# Patient Record
Sex: Male | Born: 1955 | Race: White | Hispanic: No | Marital: Married | Smoking: Former smoker
Health system: Southern US, Community
[De-identification: ages and names within clinical notes are randomized; demographics above are authoritative.]

## PROBLEM LIST (undated history)

## (undated) DIAGNOSIS — J45909 Unspecified asthma, uncomplicated: Secondary | ICD-10-CM

## (undated) DIAGNOSIS — I8390 Asymptomatic varicose veins of unspecified lower extremity: Secondary | ICD-10-CM

## (undated) DIAGNOSIS — N400 Enlarged prostate without lower urinary tract symptoms: Secondary | ICD-10-CM

## (undated) DIAGNOSIS — M542 Cervicalgia: Secondary | ICD-10-CM

## (undated) DIAGNOSIS — I10 Essential (primary) hypertension: Secondary | ICD-10-CM

## (undated) DIAGNOSIS — J189 Pneumonia, unspecified organism: Secondary | ICD-10-CM

## (undated) DIAGNOSIS — J3489 Other specified disorders of nose and nasal sinuses: Secondary | ICD-10-CM

## (undated) DIAGNOSIS — R0600 Dyspnea, unspecified: Secondary | ICD-10-CM

## (undated) DIAGNOSIS — H9190 Unspecified hearing loss, unspecified ear: Secondary | ICD-10-CM

## (undated) DIAGNOSIS — I671 Cerebral aneurysm, nonruptured: Secondary | ICD-10-CM

## (undated) DIAGNOSIS — L0390 Cellulitis, unspecified: Secondary | ICD-10-CM

## (undated) DIAGNOSIS — M1990 Unspecified osteoarthritis, unspecified site: Secondary | ICD-10-CM

## (undated) DIAGNOSIS — J449 Chronic obstructive pulmonary disease, unspecified: Secondary | ICD-10-CM

## (undated) DIAGNOSIS — K219 Gastro-esophageal reflux disease without esophagitis: Secondary | ICD-10-CM

## (undated) DIAGNOSIS — C801 Malignant (primary) neoplasm, unspecified: Secondary | ICD-10-CM

## (undated) DIAGNOSIS — I872 Venous insufficiency (chronic) (peripheral): Secondary | ICD-10-CM

## (undated) DIAGNOSIS — G8929 Other chronic pain: Secondary | ICD-10-CM

## (undated) DIAGNOSIS — I729 Aneurysm of unspecified site: Secondary | ICD-10-CM

## (undated) DIAGNOSIS — F319 Bipolar disorder, unspecified: Secondary | ICD-10-CM

## (undated) DIAGNOSIS — F909 Attention-deficit hyperactivity disorder, unspecified type: Secondary | ICD-10-CM

## (undated) HISTORY — DX: Gastro-esophageal reflux disease without esophagitis: K21.9

## (undated) HISTORY — PX: NOSE SURGERY: SHX723

## (undated) HISTORY — DX: Asymptomatic varicose veins of unspecified lower extremity: I83.90

## (undated) HISTORY — PX: JOINT REPLACEMENT: SHX530

## (undated) HISTORY — PX: OTHER SURGICAL HISTORY: SHX169

## (undated) HISTORY — DX: Cerebral aneurysm, nonruptured: I67.1

## (undated) HISTORY — PX: WISDOM TOOTH EXTRACTION: SHX21

## (undated) HISTORY — DX: Venous insufficiency (chronic) (peripheral): I87.2

---

## 2001-02-03 ENCOUNTER — Encounter: Payer: Self-pay | Admitting: Orthopedic Surgery

## 2001-02-03 ENCOUNTER — Ambulatory Visit (HOSPITAL_COMMUNITY): Admission: RE | Admit: 2001-02-03 | Discharge: 2001-02-03 | Payer: Self-pay | Admitting: Orthopedic Surgery

## 2002-08-30 ENCOUNTER — Ambulatory Visit (HOSPITAL_COMMUNITY): Admission: RE | Admit: 2002-08-30 | Discharge: 2002-08-30 | Payer: Self-pay | Admitting: Family Medicine

## 2002-08-30 ENCOUNTER — Encounter: Payer: Self-pay | Admitting: Family Medicine

## 2002-12-22 ENCOUNTER — Encounter: Payer: Self-pay | Admitting: Gastroenterology

## 2002-12-22 ENCOUNTER — Ambulatory Visit (HOSPITAL_COMMUNITY): Admission: RE | Admit: 2002-12-22 | Discharge: 2002-12-22 | Payer: Self-pay | Admitting: Gastroenterology

## 2002-12-23 ENCOUNTER — Encounter (INDEPENDENT_AMBULATORY_CARE_PROVIDER_SITE_OTHER): Payer: Self-pay | Admitting: *Deleted

## 2002-12-23 ENCOUNTER — Ambulatory Visit (HOSPITAL_COMMUNITY): Admission: RE | Admit: 2002-12-23 | Discharge: 2002-12-23 | Payer: Self-pay | Admitting: Gastroenterology

## 2003-08-14 ENCOUNTER — Encounter
Admission: RE | Admit: 2003-08-14 | Discharge: 2003-11-12 | Payer: Self-pay | Admitting: Physical Medicine and Rehabilitation

## 2003-11-13 ENCOUNTER — Encounter
Admission: RE | Admit: 2003-11-13 | Discharge: 2004-02-11 | Payer: Self-pay | Admitting: Physical Medicine and Rehabilitation

## 2004-02-13 ENCOUNTER — Encounter
Admission: RE | Admit: 2004-02-13 | Discharge: 2004-05-13 | Payer: Self-pay | Admitting: Physical Medicine and Rehabilitation

## 2004-06-04 ENCOUNTER — Encounter
Admission: RE | Admit: 2004-06-04 | Discharge: 2004-09-02 | Payer: Self-pay | Admitting: Physical Medicine and Rehabilitation

## 2004-06-05 ENCOUNTER — Ambulatory Visit: Payer: Self-pay | Admitting: Physical Medicine and Rehabilitation

## 2004-08-09 ENCOUNTER — Ambulatory Visit: Payer: Self-pay | Admitting: Physical Medicine and Rehabilitation

## 2004-09-16 ENCOUNTER — Encounter
Admission: RE | Admit: 2004-09-16 | Discharge: 2004-12-11 | Payer: Self-pay | Admitting: Physical Medicine and Rehabilitation

## 2004-10-16 ENCOUNTER — Ambulatory Visit: Payer: Self-pay | Admitting: Physical Medicine and Rehabilitation

## 2004-12-13 ENCOUNTER — Ambulatory Visit: Payer: Self-pay | Admitting: Physical Medicine and Rehabilitation

## 2005-02-13 ENCOUNTER — Ambulatory Visit: Payer: Self-pay | Admitting: Physical Medicine and Rehabilitation

## 2005-02-13 ENCOUNTER — Encounter
Admission: RE | Admit: 2005-02-13 | Discharge: 2005-05-14 | Payer: Self-pay | Admitting: Physical Medicine and Rehabilitation

## 2005-03-24 ENCOUNTER — Ambulatory Visit: Payer: Self-pay | Admitting: Physical Medicine and Rehabilitation

## 2005-05-23 ENCOUNTER — Encounter
Admission: RE | Admit: 2005-05-23 | Discharge: 2005-08-21 | Payer: Self-pay | Admitting: Physical Medicine and Rehabilitation

## 2005-05-23 ENCOUNTER — Ambulatory Visit: Payer: Self-pay | Admitting: Physical Medicine and Rehabilitation

## 2005-07-15 ENCOUNTER — Ambulatory Visit: Payer: Self-pay | Admitting: Physical Medicine and Rehabilitation

## 2005-09-11 ENCOUNTER — Ambulatory Visit: Payer: Self-pay | Admitting: Physical Medicine and Rehabilitation

## 2005-09-11 ENCOUNTER — Encounter
Admission: RE | Admit: 2005-09-11 | Discharge: 2005-12-10 | Payer: Self-pay | Admitting: Physical Medicine and Rehabilitation

## 2005-11-07 ENCOUNTER — Ambulatory Visit: Payer: Self-pay | Admitting: Physical Medicine and Rehabilitation

## 2005-12-30 ENCOUNTER — Encounter
Admission: RE | Admit: 2005-12-30 | Discharge: 2006-03-30 | Payer: Self-pay | Admitting: Physical Medicine and Rehabilitation

## 2005-12-30 ENCOUNTER — Ambulatory Visit: Payer: Self-pay | Admitting: Physical Medicine and Rehabilitation

## 2006-02-19 ENCOUNTER — Ambulatory Visit: Payer: Self-pay | Admitting: Physical Medicine and Rehabilitation

## 2006-04-16 ENCOUNTER — Ambulatory Visit: Payer: Self-pay | Admitting: Physical Medicine and Rehabilitation

## 2006-04-16 ENCOUNTER — Encounter
Admission: RE | Admit: 2006-04-16 | Discharge: 2006-07-15 | Payer: Self-pay | Admitting: Physical Medicine and Rehabilitation

## 2006-05-18 ENCOUNTER — Ambulatory Visit: Payer: Self-pay | Admitting: Physical Medicine and Rehabilitation

## 2006-06-15 ENCOUNTER — Ambulatory Visit: Payer: Self-pay | Admitting: Physical Medicine and Rehabilitation

## 2006-07-22 ENCOUNTER — Encounter
Admission: RE | Admit: 2006-07-22 | Discharge: 2006-10-20 | Payer: Self-pay | Admitting: Physical Medicine and Rehabilitation

## 2006-08-10 ENCOUNTER — Ambulatory Visit: Payer: Self-pay | Admitting: Physical Medicine and Rehabilitation

## 2007-12-02 ENCOUNTER — Ambulatory Visit (HOSPITAL_COMMUNITY): Admission: RE | Admit: 2007-12-02 | Discharge: 2007-12-02 | Payer: Self-pay | Admitting: Internal Medicine

## 2007-12-02 IMAGING — CR DG CHEST 2V
2 series · 2 of 2 positions shown · non-contrast
Comparison: None available

CLINICAL DATA: COUGH, NOSEBLEED

CHEST - 2 VIEW

[view not recorded (1 of 2)]
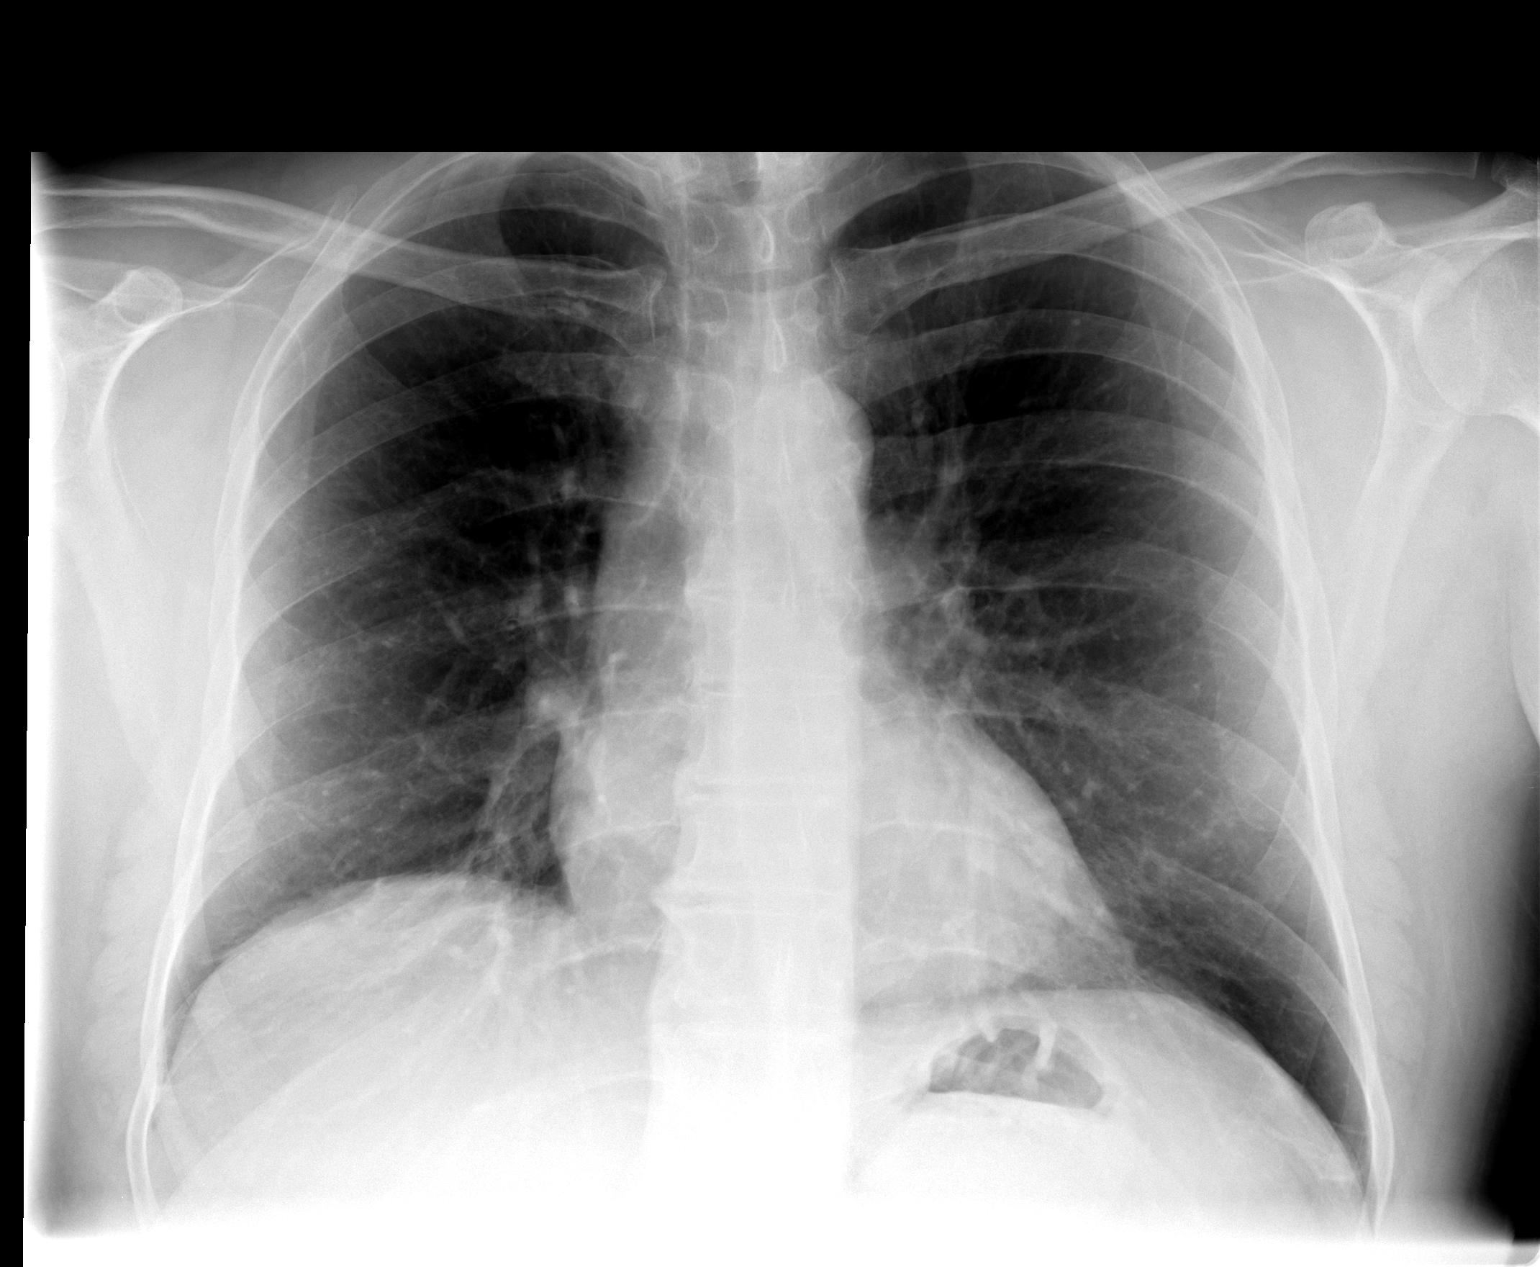

[view not recorded (2 of 2)]
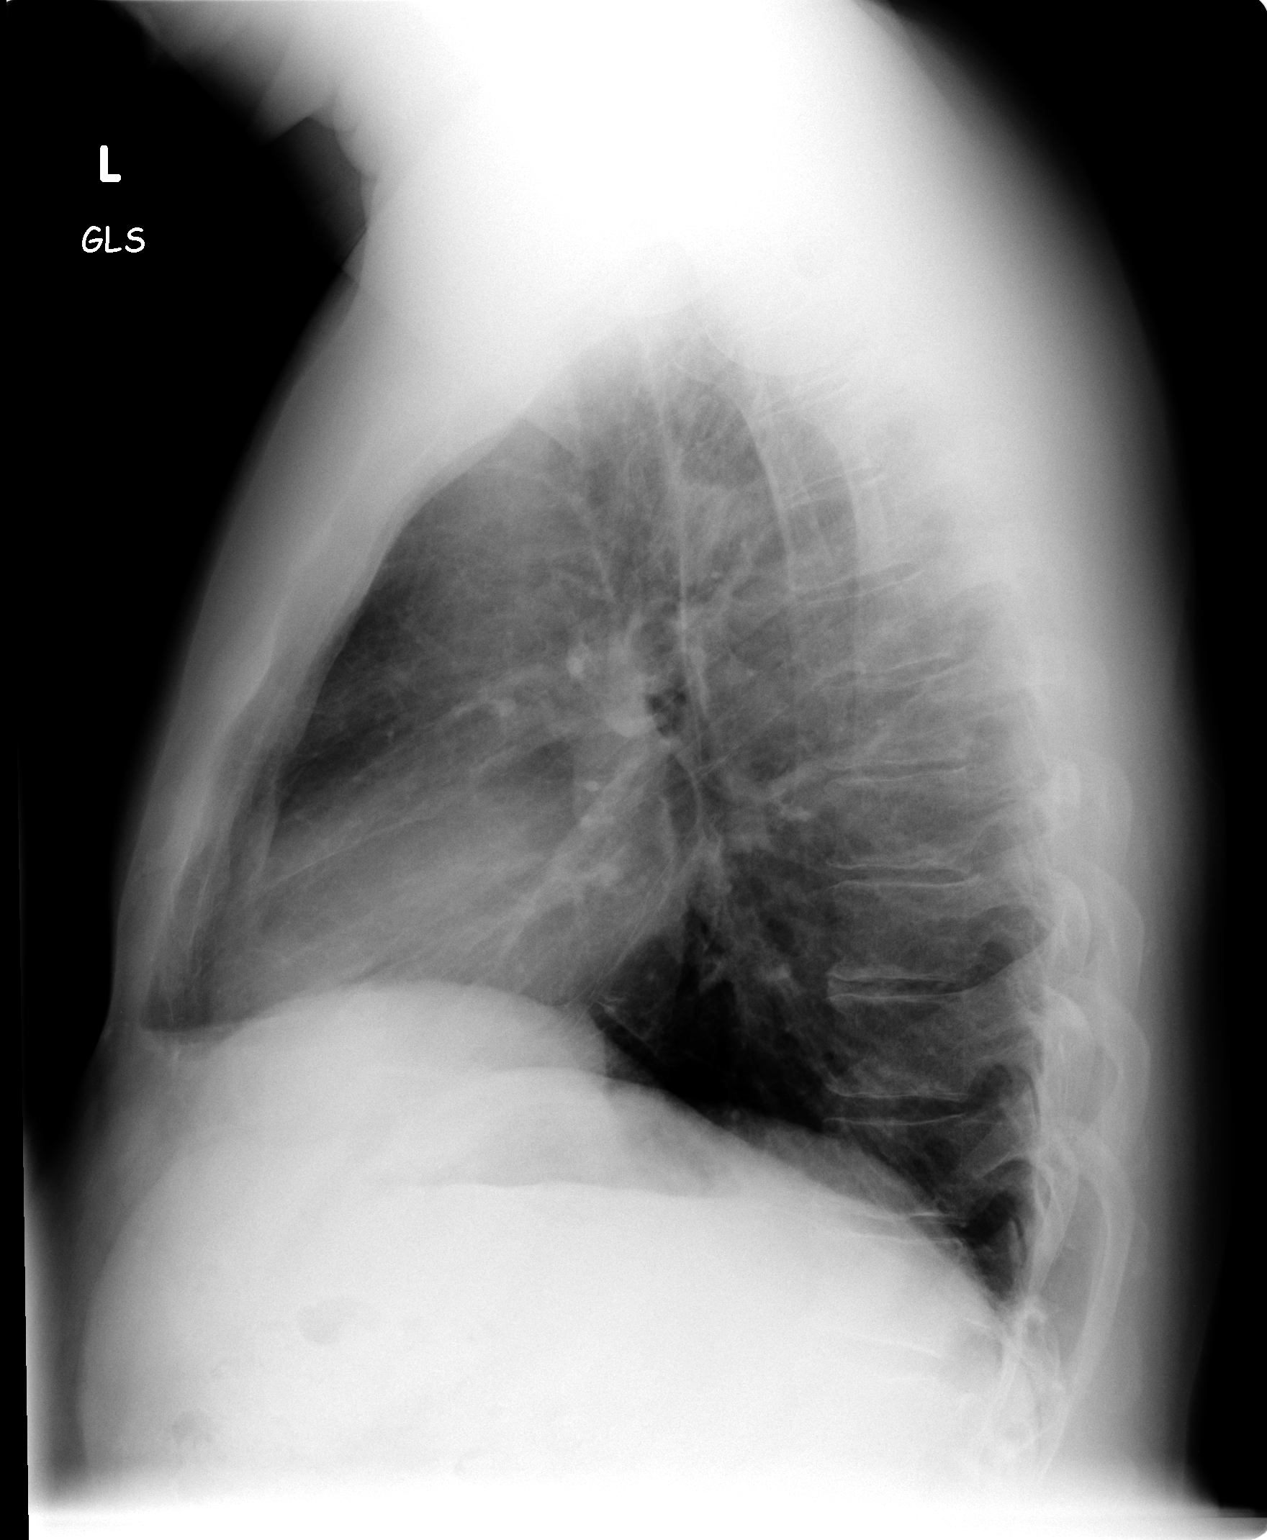

[2 of 2 positions shown; findings below may reference images not displayed]

FINDINGS: Lungs clear.  Heart size and pulmonary vascularity
normal.  No effusion.  Visualized bones unremarkable.]
IMPRESSION: [1.  No acute disease

## 2009-08-27 ENCOUNTER — Ambulatory Visit (HOSPITAL_BASED_OUTPATIENT_CLINIC_OR_DEPARTMENT_OTHER): Admission: RE | Admit: 2009-08-27 | Discharge: 2009-08-27 | Payer: Self-pay | Admitting: Orthopedic Surgery

## 2010-05-24 ENCOUNTER — Emergency Department (HOSPITAL_COMMUNITY): Admission: EM | Admit: 2010-05-24 | Discharge: 2010-05-25 | Payer: Self-pay | Admitting: Emergency Medicine

## 2010-12-09 LAB — POCT HEMOGLOBIN-HEMACUE: Hemoglobin: 15.2 g/dL (ref 13.0–17.0)

## 2010-12-30 ENCOUNTER — Other Ambulatory Visit: Payer: Self-pay | Admitting: Dermatology

## 2011-01-24 NOTE — Assessment & Plan Note (Signed)
Philip Gates is a 55 year old married white gentleman who is working full-time.  He is being seen our pain and rehabilitative clinic for chronic right knee  pain.   He is status post arthroscopic surgery.  He has chondromalacia of the  patella, is unable to take nonsteroidal anti-inflammatory medications.   His pain has been controlled with Percocet up to five per day for this.   He remains quite functional, working 40 hours a week.  He is also  participating in avocational activities such as golfing and other outside  activities.   He continues to be able to walk 30 minutes at a time, is a highly functional  individual.   No new problems regarding past medical, social or family history.   Denies any problems with bowel and bladder.  Denies depression or suicidal  ideation.  Does admit to anxiety, however.   PAIN INVENTORY:  Average pain is about a 4 on a scale of 5.  He sleeps for  the most part fairly well.  He gets fair relief with the current medications  he is on.  Pain is worse with activities, improves with rest, medications.   PHYSICAL EXAMINATION:  VITAL SIGNS:  Blood pressure today is 139/85, pulse  88, respirations 17, 99% saturated on room air.  GENERAL:  He is a well-developed, well-nourished gentleman who appears his  stated age.  He is oriented x3.  Affect is bright, alert, cooperative and  pleasant.  MUSCULOSKELETAL/NEUROLOGIC:  Gait in the room today reveals nonantalgic  gait, fairly good, normal mechanics.  There is no effusion noted in the  joint.  He has full range of motion in the joint, full extension.   IMPRESSION:  1.  Chondromalacia of the patella, right knee.  2.  History of gastroesophageal reflux disease and nonsteroidal anti-      inflammatory medication intolerance.  3.  Anxiety.   The patient wanted to discuss options in his medications today.  He has been  reading about longer-acting narcotics.  He states he typically takes several  Percocet a  day, does not like to be doing this on the job.  He is wondering  if he may benefit from a longer acting-type morphine.  He has been reading  about Kadian and would like to trial this for a few days to see how he would  react to it.  He also states he has low-grade anxiety.  We discussed  possibly adding Effexor.  He would like to trial this as well.   Today we will go ahead and give him a trial of Kadian 20 mg one p.o. q.a.m.  for five days.  We will have him assess how it works for him.  We will need  to reduce the Percocet he is taking if he continues on this medication.  We  will discuss this further next month.  Will also trial him on Effexor,  starting him with some samples at 37.5 mg for one week, followed by 75 mg,  and a prescription was written as well, one p.o. daily, 75 mg SR, #30, with  two refills.  We will see him back in the next six to eight weeks, reassess these  medications with him and assess pain management with him.           ______________________________  [name], M.D.     DMK/MedQ  D:  01/30/2006 11:38:09  T:  01/31/2006 09:50:58  Job #:  [id number]

## 2011-01-24 NOTE — Assessment & Plan Note (Signed)
The patient is a 55 year old married white male who is being seen in our  pain and rehabilitative clinic for pain management of his right knee pain.  He has undergone multiple surgeries on this knee and has chronic pain.   He has an intolerance to nonsteroidal anti-inflammatory agents, has a  history of gastroesophageal reflux disease, and is on Aciphex.   The patient's pain has been controlled well taking four to five Oxycodone  10/325 per day to remain functional.   He is back in.  He reports his average pain is about a 5 on a scale of 10,  currently at this time it is about a 4 on a scale of 10.  He describes his  pain as intermittent, dull, and aching.   Moderately interferes with his general activity, moderately interferes with  his enjoyment of life.   Pain is worse in the morning and in the evening.  Sleep overall is good.   Pain is worse with bending and sitting and certain activities which put  stress on his right knee.  Improves with rest and medication.  Reports good  relief with his medications.   FUNCTIONAL STATUS:  The patient walks without any assistance.  He is off and  on his leg all day long.  He is able to go up and down stairs.  He drives.  He is working full time as an Leisure centre manager.  Denies any problems with respect to  neuropsychiatric questions.   ALLERGIES:  No new allergies are noted.   He denies any new problems with review of systems.   Physicians currently involved in the patient's care include:  Danise Edge, M.D., who is his gastroenterologist, and Ike Bene, M.D. who  is his family medicine doctor.   He denies any changes in social, family history.  Denies any changes in his  past medical history.   PHYSICAL EXAMINATION:  VITAL SIGNS:  Blood pressure 145/86, pulse 100,  respirations 18, 96% saturated on room air.  GENERAL:  The patient is a well-developed, well-nourished, gentleman who  displays normal affect.  He is alert and oriented  x3.  Appropriate.  He is  able to get out of the chair without difficulty.  His gait is nonantalgic.  He has some difficulty getting up from a squat position due to pain in his  right knee seated.  He has no obvious swelling about the right knee.  He has  tenderness along the medial joint line and some tenderness in the pes  anserine region as well today.  He has no medial, lateral, or AP  instability.  He does have some crepitus with flexion and extension of the  knee.  He has full range of motion of the knee.   IMPRESSION:  1.  Right knee advanced chondromalacia patella.  2.  History of gastroesophageal reflux disease with nonsteroidal anti-      inflammatory agent intolerance.   PLAN:  I encouraged the patient to continue doing some stretches which were  taught to him recently for his calf, his hamstring, and piriformis muscles.  We will refill his Oxycodone 10/325 one p.o. q.4-6h p.r.n. pain #140.  We  will see him back in one month. He was also given a note today stating that  he had been seen in our clinic.       DMK/MedQ  D:  09/18/2004 09:37:24  T:  09/18/2004 10:01:50  Job #:  [id number]

## 2011-01-24 NOTE — Assessment & Plan Note (Signed)
Philip Gates is a 55 year old married white gentleman, who has been seen in our  pain and rehabilitative clinic for chronic right knee pain.  He is status  post arthroscopic surgery and has chondromalacia patellae and is unable to  take nonsteroidal anti-inflammatory medications.  This pain has been  controlled with oxycodone, and he takes up to 5 pills a day for this.   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  He is  able to participate in golfing and other outside activities.  He is able to  walk about 30 minutes at a time.  He climbs stairs.  He is driving.  He is  independent with all of his health care.   REVIEW OF SYSTEMS:  Negative for any new problems.   Past medical, social, family history are unchanged.   PHYSICAL EXAMINATION:  VITAL SIGNS:  Blood pressure 155/90, pulse 107,  respirations 15, 97% saturations on room air.  GENERAL:  He is a well-developed, well-nourished gentleman, appears his  stated age.  He is oriented x3.  Affect bright, alert, cooperative, and  pleasant.   Gait transitioning from sit to stand no difficulty.  His gait is entirely  normal.  No antalgia is noted.  His stride length is symmetric.  In the  seated position, knee is examined.  There is no evidence of effusion.  He  had some mild right joint leg tenderness.  He has full flexion and extension  without loss of range of motion.   IMPRESSION:  1.  Chondromalacia patellae, right knee.  2.  History of gastroesophageal reflux disease and nonsteroidal intolerance.   PLAN:  We will continue acetaminophen and oxycodone.  Medications were  refilled today, 10/325 Percocet 1 p.o. q.4-6h. #140, no refills.  He has  been stable on this medicine, no problems with aberrant behavior.  No  problems with adverse reactions, and he is not oversedated.  He stays very  functional, and he is not constipated.  I mentioned to him his blood  pressure has been elevated on multiple visits with  our clinic.  I would like  him to follow up with primary care physician to have this evaluated.  He  understands and states he will get this checked into.  We will see him back  in 3 months, nursing visit x2.           ______________________________  [name], M.D.     DMK/MedQ  D:  12/31/2005 13:41:14  T:  01/01/2006 11:17:59  Job #:  [id number]

## 2011-01-24 NOTE — Assessment & Plan Note (Signed)
HISTORY OF PRESENT ILLNESS:  Philip Gates is a 55 year old married white male  who is being seen in our Pain and Rehabilitative Clinic for chronic right  knee pain.   He is back in today for a brief recheck and pill count.  His average pain is  about a 4 on a scale of 10.  He has been able to stay active.  He took a  trip to 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   His pain is described as dull and sharp into the right knee area.  Relief  from medications is in the good range.  Pain interferes moderately with his  activities.  The pain is specifically worsened with bending and sitting.  He  is able to walk about 30 minutes at a time.   Denies any problems controlling bowel or bladder, suicidal ideations.  No  new changes, otherwise, in his review of systems.   Past medical, social, family history unchanged since last visit.   PHYSICAL EXAMINATION:  VITAL SIGNS:  Blood pressure 141/75, pulse 95,  respirations 16, 98% saturated on room air.  GENERAL APPEARANCE:  He is a well-developed, well-nourished gentleman in no  apparent distress.  He is oriented x3.  Affect bright, alert, cooperative  pleasant.  Gait is normal today.  No antalgia noted.  There is no swelling  in the right knee.  No effusion noted.   IMPRESSION:  1.  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  2.  History of gastroesophageal reflux disease.  3.  on-steroidal anti-inflammatory drug intolerance.   PLAN:  The patient does not need a prescription today.  He will come back in  about a week and a half and have a refill.  At that time, we will check a  urine drug screen on him.       DMK/MedQ  D:  03/26/2005 12:02:59  T:  03/26/2005 12:51:11  Job #:  [id number]

## 2011-01-24 NOTE — Group Therapy Note (Signed)
CHIEF COMPLAINT:  Right knee pain.   HISTORY:  Philip Gates is a 55 year old gentleman who was referred to our pain  and rehabilitative clinic from Dr. Lew Dawes.  Apparently Philip Gates was  discharged June 29, 2003 with a letter from Dr. Lew Dawes stating he had  been noncompliant.   Philip Gates has a history of a knee injury dating back to 2000 when he was  working with [employer].  At that time he had been coming off of a  stepladder, apparently twisted his knee and it gave away.  He had seen Dr.  Rennis Chris and underwent arthroscopic right knee surgery in March of 2000.  He  subsequently has had multiple injections to the right knee for treatment of  right knee pain.  The last time he had physical therapy was in 2000 as well.  He did have an MRI done in May of 2002 of right knee which showed extensive  patellar tendinosis and tendinopathy.  He also had chondromalacia patellae,  probable degenerative changes of his PCL.  His ACL and PCL were noted to be  intact and at that time there was a small effusion noted as well as a tear  of the medial meniscus and questionable lateral meniscal tear.  Degenerative  arthrosis was noted at the femoral tibial and patella femoral articulations  with articular cartilage loss noted at multiple sites, mainly along the  patella and anterior aspect of the lateral femoral condyle.   The patient has been treated by Dr. Lew Dawes with Lavera Guise as well as Ultracet  and he also has been using a knee band and he does have a Thera-Band that he  has used for exercise but he does not have a home program which he  participates in at this time.   Philip Gates describes his knee pain as dull and achy and stiff, it is worse  after he has been sitting for a prolonged period of time, worse after  increased activity.  He cannot do any kind of a deep knee bend, has trouble  getting things off the floor unless he supports the knee.  He has stopped  doing some activities he  loves such as basketball, and has started doing  some things that he can do such as play golf and swimming.  He denies any  kind of swelling.  Reports the knee may lock on him very rarely, maybe once  every three months or so and it does not stay locked for any period of time.  Denies any weakness.  He reports his pain to be about a six at its worst, a  two at best, and on the average at about a five.  He denies any numbness,  tingling problems.  No problems with falls.   He has been seeing Dr. Eulah Pont for a left hamstring tear.   The patient is independent with all of his activities of daily living.  Mobility - he is independent, somewhat limited in walking for any prolonged  period of time, limited in sitting for any prolonged period of time.  He  does enjoy golfing and swimming and he also coaches basketball.  He denies  any problems with sleeping and reports that he feels his coping skills and  mood are fine.   CURRENT MEDICATIONS:  1. Endocet 10/325 q.4h.  2. Ultracet 37.5/325 two p.o. q.i.d.  3. Guaifenesin one p.o. q.4h.  4. Aciphex 20 mg b.i.d.  5. Zetia one p.o. daily.  6. Zantac 300  mg daily.  7. Vitamin C daily.  8. Codiclear for sinuses.   ALLERGIES:  PENICILLIN, AND AN INTOLERANCE TO NSAID'S because of his GI  status.   PAST SURGICAL HISTORY:  Sinus surgery about 12 years ago, and the left knee  surgery as noted above.   FAMILY HISTORY:  Unremarkable.   REVIEW OF SYSTEMS:  As noted in the chart, he denies any problems with  fevers or chills, weight loss or gain.  No headaches, he does have some  problems with his sinuses.  He also has reflux and heart burn.  No insomnia,  no night sweats.   SOCIAL HISTORY:  The patient is married and lives with his wife and 17-year-  old daughter.  He drinks about four to six caffeinated drinks a day, denies  alcohol use.  Denies smoking or recreational drug use.  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   He denies harm to himself or others.   PHYSICAL EXAMINATION:  VITAL SIGNS:  The patient's pulse was 100, blood  pressure 135/82, saturating 98% on room air.  GENERAL:  Well-developed, well-nourished adult male in no apparent distress.  He appeared healthy and was cooperative and appropriate throughout the  interview.   His gait was normal in the room and as he walked down the hall.  He had no  obvious valgus or varus deformities at the knee, no obvious swelling was  noted in the right or left knee anterior or posteriorly.  He had no  instability anterior, posterior, medial or laterally.  Lachman's was  negative.  Anterior drawer was negative.  Medial and collateral ligaments  appeared to be intact.  There was no tenderness to palpation along the  iliotibial band or pes anserinus area, there was no tenderness to palpation  along the medial or collateral ligaments.  He did have increased pain with  palpation specifically along the right knee, along the medial joint line.  Again no obvious swelling was noted.  The patient had normal bulk at the  knee, VMO was also of normal bulk.  He had 5/5 motor strength at the hip  flexors, knee extensors, dorsiflexors, EHL as well as knee flexors.  Range  of motion in the right knee was 0 to 135.   DIAGNOSES:  1. Right knee osteoarthritis.  2. Chronic right knee pain secondary to number one.   ASSESSMENT:  This is a 55 year old gentleman who has osteoarthritis of the  right knee in the patella femoral compartment as well as the lateral femoral  condyle.  He remains fairly functional, he is working and he is able to  continue to participate in some sporting activities.  He is no longer able to participate in basketball which he does love, and he has difficulty with  prolonged sitting or any  kind of prolonged activity with the right knee.   He is currently well controlled on the Endocet as well as the Ultracet that  he takes for pain management.  He does not take the Ultracet four times a  day as he was prescribed, he does not feel he needs to take it that often.  He does take the Endocet every four hours however.  I cautioned him about  taking any more acetaminophen given  the doses of medications he is currently  on.  Would like to see a possible decrease in the  acetaminophen intake  overall anyway.   PLAN:  1. Will consider getting him into a physical therapy center to evaluate him     and give him a good home program so he can continue to keep up the     strength that he has in that right lower extremity.  2. Medications were refilled today and include only the Endocet.  He was     given Endocet 10/325 one p.o. q.4h #186 units with no refills.  He was     also given Biofreeze as well as a Lidoderm patch and nursing instructed     him on their use.  3. Will encourage him to maintain overall endurance in strength with a     swimming program.  4. Will see him back in one month and at that point will consider getting     him set up for some therapy for a home program.   This case was discussed and at the end of his physical exam the nurse case  manager was brought into the room with the consent of the patient and our  plan was discussed with her.     [name], M.D.   DMK/MedQ  D:  08/16/2003 12:32:48  T:  08/16/2003 14:08:15  Job #:  [id number]   cc:   Vania Rea. Rennis Chris, M.D.  [address]  [place]  [place] [address]  Fax: [phone]   Loreta Ave, M.D.  [address]  [place] [address]  Fax: [phone]

## 2011-01-24 NOTE — Assessment & Plan Note (Signed)
MEDICAL RECORD NUMBER:  [id number].   Philip Gates is a 54 year old white male who is back in for a recheck and  refill of his medications. He is being seen in our pain and rehabilitation  clinic for chronic right knee pain, has a history of multiple surgeries on  his right knee. He is unable to take nonsteroidal anti-inflammatory agents  as he has a history of gastroesophageal reflux disease and is on Aciphex.   The patient's right knee pain has been controlled with oxycodone 10/325 one  p.o. q.4-6h. for a total of 140 each month. He has not had any problems with  escalation of these medications or any management issues regarding this.   His average pain is about a 5 on a scale of 10, currently about a 4. It  moderately interferes with some of his activities. Pain basically varies  with the type of activity which he engages in. Pain is intermittent and dull  in nature, worse with bending and sitting, improves with rest and  medication. He gets fairly good relief overall with medications.   He walks without any assistance. 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 He  is independent with all of his self-care.   Denies problems controlling bowel or bladder. No suicidal ideation or  thoughts.   He has no changes in review of systems since last visit. No changes in past  medical, social or family history since last visit.   PHYSICAL EXAMINATION:  VITAL SIGNS:  124/88, pulse 92, respirations 16, 97%  saturated on room air.  GENERAL:  Philip Gates is a well-developed, well-nourished, white gentleman who  does not appear in any distress during our interview. He is oriented to  person, place, and date. His affect is overall bright and alert. He is able  to independently stand from a seated position. His gait is slightly antalgic  with the first few steps; however, after several steps, I do not appreciate  any kind of limp or antalgic gait.   He is seated. There is no swelling noted about the  knee. No edema. He has no  joint line tenderness today. No crepitus is noted with flexion and  extension. He has full range flexion and extension. No laxity medial,  lateral or AP.   IMPRESSION:  1.  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  2.  History of gastroesophageal reflux disease with nonsteroidal anti-      inflammatory agent intolerance.   PLAN:  We will refill patient's oxycodone 10/325 one p.o. q.4-6h. p.r.n.  pain #140. We will see him back in one month.      DMK/MedQ  D:  10/18/2004 12:03:21  T:  10/18/2004 14:30:47  Job #:  [id number]

## 2011-01-24 NOTE — Assessment & Plan Note (Signed)
REFERRING PHYSICIAN:  Vania Rea. Supple, M.D.   Ms. Serna is a 55 year old white male who has a history of chronic knee pain  who is unable to take nonsteroidal anti-inflammatory medications.  He is  also on Aciphex and Zantac for  his stomach.   His pain scores in the last month on the average is about a 5 on a scale of  10, worst is a 7, best in the last 24 hours is about a 4 on a scale of 10.   Mr. Prokop continues to be quite active.  He is golfing one to two days a  week.  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  He is  currently building a home and he also is working full time.   Mr. Chamberlin continues to take his oxycodone appropriately.  We have never  had  a problem with him escalating his medications.  He is on 10/325 oxycodone  four times a day and this controls his knee pain adequately for him.   He is also asking about a drug he saw in the [employer].  He brings in  the article.  It is about Provigil.  My understanding of this medication is  that it is for narcolepsy, I do not believe it would be indicated in Mr.  Weisensel case but I have asked him to discuss this further with his primary  care physician if he is quite interested in it.   MEDICATION:  Oxycodone 10/325 q.i.d.   PHYSICAL EXAMINATION:  GENERAL APPEARANCE:  Mr. Lamay is alert, cooperative  and pleasant.  He does not appear in any distress.  He is able to get out of  the chair.  He is a little stiff initially.  His gait is normal in the room.  EXTREMITIES:  Examination of his knees reveal no effusion.  He has full  range of motion, extension as well as about 135 degrees of flexion.  He is  able to do a squat and return back up.  He has good quad tone.  No  quadriceps tone. He has right medial joint line tenderness, no joint line  tenderness on the left knee.  He has no crepitus noted today.  No  instability on AP or lateral is noted either.  No edema.  No neuropathic  changes are noted.   IMPRESSION:  1. Advanced chondromalacia patella.  2. Early osteoarthritis.   PLAN:  Continue oxycodone 10/325 one p.o. q.i.d.  Would also recommend some  therapy at some point to review body mechanics to take some stress off  particularly the right medial joint line during some of his other  activities.      [name], M.D.   DMK/MedQ  D:  02/14/2004 12:18:47  T:  02/14/2004 13:21:20  Job #:  [id number]   cc:   Vania Rea. Supple, M.D.  [facility]  [address]  [address]  [place]  [place] [address]  Fax: [phone]

## 2011-01-24 NOTE — Assessment & Plan Note (Signed)
MEDICAL RECORD NUMBER:  [id number]   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  He is being seen in our pain and rehabilitative clinic for a  Worker's Comp injury.  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   He uses between four and occasionally five Percocet a day to maintain his  function and reduce his knee pain.  His pain averages between a 4 and a 7 on  a scale of 10; 7 is when he is not medicated, 4 is when he is taking his  medication.  He reports overall good relief with the medications, he sleeps  well.  Pain is described as a dull ache and fairly constant in the right  knee.   He is able to walk at least 30 minutes at a time.  He is able to climb  stairs, he drives.  He is independent with all self care, high-level ADL  activities as well.   No changes in past medical, social, or family history since our last visit.   EXAMINATION:  Blood pressure 144/94, pulse 102, respirations 16, 97%  saturated on room air.  He is a well-developed, well-nourished gentleman who  appears his stated age.  He does not appear in any distress.   He is oriented x3.  Affect is bright, alert.  He is cooperative and  pleasant.   His gait is normal.  He transitions from sit to stand without any  difficulty.  No antalgia is noted.  Seated, right knee is evaluated.  No  evidence of effusions appreciated.  Mild right joint line tenderness is  noted.  Minimal crepitus is noted today.  He has full range of motion with  extension and flexion.  No instability is appreciated.   IMPRESSION:  1. Right knee pain.  2. History of chondromalacia patellae.  3. Mild right medial joint line tenderness.  4. History of gastroesophageal reflux and nonsteroidal anti-inflammatory      medication intolerance.  5. Anxiety, overall improved with the use of  Effexor.   PLAN:  We will refill his Percocet 10/325 one p.o. q.4-6h. p.r.n. knee pain,  #140.  He states that his __________ are not bothering him as much.  He  declined to have any modifications through an orthotist.  He states that if  they get worse and he has increased pain he will contact [employer] for  possible modifications.   At this point he does not display any aberrant behavior.  He is using his  medications appropriately and getting good relief, able to maintain a high  level of function.  We will see him back in 3 months, two nursing visits in  the interim.  No problems with over-sedation or constipation noted.           ______________________________  [name], M.D.     DMK/MedQ  D:  04/20/2006 10:10:17  T:  04/20/2006 15:49:17  Job #:  [id number]

## 2011-01-24 NOTE — Assessment & Plan Note (Signed)
Philip Gates is a 55 year old married white gentleman who is being seen in our  pain and rehabilitative clinic for chronic right knee pain secondary to an  old work injury.  He is status post surgery of this knee, history of  chondromalacia patella, and also has a history of NSAID intolerance, and has  been managed on Oxycodone and Acetaminophen.  He takes up to five pills a  day for this.   He is back in.  He had a refill 2 days ago.  He continues to stay quite  functional.  He works 40 hours a week.  He continues to be able to play golf  and climb stairs.  He has not been limited by the knee.  He does favor it  during some activities, however, his lifestyle has not particularly slowed  down.   His average pain is about a 3 on a scale of 10.  He is able to sleep well.  He gets good relief with the current medicines he is on.  He is able to walk  at least 30 minutes at a time.   No changes in review of systems.  No changes in past medical, social, or  family history.   PHYSICAL EXAMINATION:  VITAL SIGNS:  Blood pressure 146/93, pulse 110,  respirations 16, and 99% saturated on room air.  GENERAL:  He is a well-developed, well-nourished, gentleman.  He does appear  his stated age.  He is in no distress.  He is oriented x3.  His affect is  bright, alert, cooperative, and pleasant.  Able to stand.  Gait is normal.  Heel-toe mechanics are normal.  Base of support is normal.  No antalgia is  noted.  EXTREMITIES:  Evaluation of the right knee; no effusion.  No AP or lateral  instability.  He has full range of motion.  He does have difficulty doing a  squat.  He tends to bear most of his weight through his left leg during this  particular maneuver.   IMPRESSION:  1.  Chondromalacia patella, right knee.  2.  History of gastroesophageal reflux disease, nonsteroidal intolerance.   PLAN:  Continue with acetaminophen and Oxycodone.  His Percocet was refilled  on 10/325 on October 08, 2005.  He  is given 10/325 one p.o. q.4-6 hours  p.r.n. pain #140 no refills.  We will have him follow back up with nursing  staff over the next 2 months.  I will see him back in 3 months.  Philip Gates  is appropriate with the use of his medications.  No aberrant behavior is  noted, does not call in for early refills, and has not had any untoward side  effects from these medications.  He was given a work note today that states  he was in our office.           ______________________________  [name], M.D.     DMK/MedQ  D:  10/10/2005 12:28:06  T:  10/10/2005 13:08:16  Job #:  [id number]

## 2011-01-24 NOTE — Assessment & Plan Note (Signed)
Philip Gates is back in for a brief recheck today.  His medications were picked  up on Jan 07, 2005.  His last prescriptions were written, on Jan 09, 2005, for  Percocet 10/325 mg one p.o. q.4-6h. #140.   He reports his right knee pain continues.  He describes it as dull and  aching.  Average pain is about a 5 on a scale of 10.  Today, it is a little  worse.  He has been a little more active.  The pain is worse with bending  and prolonged sitting.  It improves with medication.  He gets very good  relief with his Percocet.  He is able to walk about 30 minutes at a time.  He does drive.  He is able to climb stairs.  He works 40 hours a week.  He  denies any problems on the health and history form in neuropsychiatric  realms or on his general review of systems.   Past medical, social, family history unchanged since last visit.   PHYSICAL EXAMINATION:  VITAL SIGNS:  Blood pressure 120/80, pulse 85,  respirations 16, 96% saturated on room air.  GENERAL:  He is a well developed well nourished gentleman.  He is oriented.  He is appropriate.  Affect is overall bright alert.  MUSCULOSKELETAL:  His gait is normal.  He is able to stand easily after  being seated.  There is no swelling about the knee today.  Minimal  tenderness along the joint line.  He has good motion full range.  Normal  gait.   IMPRESSION:  1.  This gentleman has advanced chondromalacia of the patella of the right      knee.  2.  He has a history of gastroesophageal reflux disease.  3.  Nonsteroidal anti-inflammatory intolerance.   1.  He did not bring in his medications for a pill count today.  Today is      Friday, Jan 17, 2005.  We will have him return Monday, Jan 20, 2005, for      a pill count and just a brief visit.  2.  I will see him back in a month.  3.  He appears to be appropriate on his medications.  4.  He has been stable.      DMK/MedQ  D:  01/17/2005 11:37:15  T:  01/18/2005 16:36:08  Job #:  [id number]

## 2011-01-24 NOTE — Assessment & Plan Note (Signed)
ACTIVE PROBLEMS BEING TREATED:  1. Right knee osteoarthritis.  2. Chronic right knee pain.   CURRENT MEDICATIONS:  1. Oxycodone/acetaminophen 10/375 one p.o. q.4h.  2. Aciphex.  3. Zantac 300 mg once daily.  4. _________ .  5. Codeine cough medicine.   The only medications we are refilling at this clinic include the  oxycodone/acetaminophen.   HISTORY:  Philip Gates is back in today.  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  He is looking forward to starting to  play golf again.   He reports his average daily pain is about 5/6.  He did move to a new home  recently that was on September 07, 2003, his pain had increased a bit after  that and during that period, prior to that he was about a 3-4 on a scale of  10.   FUNCTIONAL STATUS:  He is independent with ADL's, independent with mobility,  he is a high level functioning individual working a 40 hour week and  coaching basketball as well as participating in leisure activities.  He is  tolerating his medicine, denies any constipation or any problems with the  medication at this time.  He did not try the lidocaine, the Lidoderm patches  or the Biofreeze that were given to him the last visit.  He denies that he  could harm himself or others, denies any suicidal ideation.   PHYSICAL EXAMINATION:  Today he has a pulse of 102, blood pressure 128/81,  pulse oximetry on room air is 96%.  He appears comfortable and cooperative  and during our interview he is able to get off the exam table. Gait is  normal.  Seated reflexes are intact in the lower extremities.  Motor  strength is 5/5 in the lower extremities.  No obvious swelling is noted  around the knee.  No AP or lateral instability is noted.  Lachman is  negative.  No tenderness with the exception along the right medial joint  line.   Color and temperature are within normal  limits.   IMPRESSION:  1. Osteoarthritis right knee.  2. Chronic knee pain.   PLAN:  We will refill his oxycodone/acetaminophen today one p.o. q.4h. (#186  units given).  In one month I will plan to refill this medication again for  him.  We will follow him back up in about 8 weeks.  At that time we will get  him in a physical therapy program for education and doing some isometric  strengthening around the knee.      Philip Gates, M.D.   DMK/MedQ  D:  09/20/2003 12:46:45  T:  09/20/2003 13:51:34  Job #:  [id number]   cc:   Vania Rea. Rennis Chris, M.D.  [address]  [place]  [place] [address]  Fax: [phone]   Loreta Ave, M.D.  [address]  [place] [address]  Fax: [phone]

## 2011-01-24 NOTE — Assessment & Plan Note (Signed)
Philip Gates is back in for brief recheck, refill of his medications.  He has  been a very stable 55 year old married gentleman who has been seen in our  pain and rehabilitative clinic for chronic right knee pain related to  chondromalacia of the patella.   He is back in for recheck, pill count, urine drug screen today.   Average pain is about a 4 on a scale of 10.  The pain has been intermittent,  sharp, dull, aching.  It varies depending on his activity.  It is worse with  stairs and prolonged sitting.  He also reports increased pain with bending-  type activity with his leg.  His sleep is good.  He gets good relief with  his medications.  He can be walking at least 30 minutes at a time.  He is  able to climb stair and drive.  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  He  functions at a very high level.  He even reports playing golf recently,  doing quite well with this.  Apparently he came in third in the tournament.   No changes in review of systems.  Past medical, social and family history  are all negative, unchanged since last visit.   PHYSICAL EXAMINATION:  VITAL SIGNS:  Blood pressure 167/89, pulse 88,  respirations 16, 99% saturated on room air.  Regarding his elevated blood  pressure, he reports he has had a couple of energy drinks this morning and  feels that may be related to his blood pressure.  GENERAL:  He is a well-developed, well-nourished gentleman who appears his  stated age.  He is oriented x3.  Affect bright.  Alert, cooperative,  pleasant.  MUSCULOSKELETAL/NEUROLOGIC:  Gait is slightly antalgic.  No swelling is  noted about the knee.  Good range of motion.  He is able to do a squat and  come back up.   IMPRESSION:  1.  Chondromalacia patellae of the right knee.  2.  History of gastroesophageal reflux disease.  3.  Nonsteroidal anti-inflammatory drug intolerance.   PLAN:  Will check a urine drug screen.  Will refill Percocet 10/325 mg one  p.o. q.4-6h. #140.   Will see him back in one month.           ______________________________  Philip Gates, M.D.     DMK/MedQ  D:  04/23/2005 13:07:23  T:  04/23/2005 14:12:26  Job #:  [id number]

## 2011-01-24 NOTE — Assessment & Plan Note (Signed)
HISTORY OF PRESENT ILLNESS:  [name] is a 55 year old married gentleman  who works full time.   He is being seen in our Pain and Rehabilitative Clinic for right chronic  knee pain.  His knee pain is controlled with 4-5 Percocet each day.   He states his average pain is about a 3 on a scale of 10.  It interferes  with activity moderately.  Sleep is generally good.  The pain is worse with  bending, sitting, up on his feet walking for a prolonged period of time.  It  improves with medications.  He reports good relief with the current  medications that he is on.   His chief complaint today is pain when he wears his steel-toed shoes.  He  reports he gets increased knee pain, as well as pain over the metatarsal  heads and heels.  He has trialed different types of steel-toed footwear, and  he is wondering if there is anything more that could be done to make this  shoe wear more comfortable for him.   He is independent with his self care.  He is up on his feet a good part of  the day.  No problems regarding past medical, social or family history since  last visit.   PHYSICAL EXAMINATION:  VITAL SIGNS:  On exam today, blood pressure of  144/84, pulse 106, respirations 16, 97% saturation on room air.  GENERAL:  He is a well-developed, well-nourished, gentleman who appears his  stated age.  He is oriented x3.  His affect is bright and alert.  He is  comfortable and pleasant.   He transitions from sit to stand without any difficulty.  Gait is non-  antalgic.  Normal heel-toe mechanics.  Normal stride length.   There is no evidence of effusion today.  He has some medial joint line  tenderness with palpation.  The lateral joint line is non-tender.  No AP or  medial lateral instability is appreciated.  He does have some mild crepitus  with flexion and extension over the patella.   IMPRESSION:  1.  Chondromalacia patella, right knee.  2.  Pain in heels and metatarsal heads after wearing  steel-toed shoes.  3.  History of gastroesophageal reflux and nonsteroidal antiinflammatory      medication intolerance.  4.  Anxiety, overall improved with the use of Effexor.   PLAN:  I will refill his Percocet 10/325 one p.o. q.4-6h. p.r.n. knee pain,  #140.  A prescription was also written for to have him take his steel-toed  shoes for possible modification to include rocker bottoms for the right  lower extremity.  Will call the [employer] to discuss the case further  with them.  Will see him back in a month.   He has not displayed any aberrant behavior.  He uses his medications  appropriately, and is getting good relief and is able to maintain a high  level of functional status.           ______________________________  Philip Gates, M.D.     DMK/MedQ  D:  03/20/2006 09:57:33  T:  03/20/2006 16:10:96  Job #:  [id number]

## 2011-01-24 NOTE — Assessment & Plan Note (Signed)
MEDICAL RECORD NUMBER:  [id number]   DATE OF BIRTH:  06-07-1956   Philip Gates is a 55 year old gentleman who is being seen in our pain and  rehabilitative clinic for chronic right knee pain. He is status post surgery  on the right knee, history of chondromalacia patella and NSAID intolerance,  and has been managed on oxycodone and acetaminophen. He takes up to about  five pills a day of these for management.   His average pain is a 3 on a scale of 10. He reports good relief with his  medication. Pain is typically described as intermittent, dull and aching.   He remains quite functional. He works 40 hours a week. He is able to play  golf and climb stairs. He drives. He is a very high functioning individual  despite his right knee pain.   No new changes on health and history form regarding his past medical,  social, family history, or his review of systems.   EXAMINATION:  Blood pressure 13072, pulse 101, respirations 16, 98%  saturated on room air.  He is a well-developed, well-nourished gentleman,  does not appear in any distress. He is oriented x3. His affect is bright,  alert, cooperative, and pleasant. His gait is normal. He has full range of  motion at the knee with full extension to 180 and full flexion. No effusion  is noted at the knee, no tenderness, no instability.   IMPRESSION:  1.  Chondromalacia patella, right knee.  2.  Gastroesophageal reflux disease, nonsteroidal intolerance.   PLAN:  Drug screen was appropriate last visit. Will refill his Percocet  today. He has been appropriate with his counts, no aberrant behavior  observed, and he gets appropriate pain management with the medication. We  will see him back in a month. Will have nursing visit see him over the next  two months and I will see him on the third.           ______________________________  [name], M.D.     DMK/MedQ  D:  07/16/2005 13:21:01  T:  07/16/2005 15:34:23  Job #:   [id number]

## 2011-01-24 NOTE — Assessment & Plan Note (Signed)
Philip Gates is a 55 year old gentleman who is being seen in our Pain and  Rehabilitative Clinic for pain related to his right knee.   He has a history of chondromalacia patella.  He is status post an  arthroscopic surgery several years ago.   Over the last several months, Philip Gates has called in for bridges on his  prescription.  He last called on July 01, 2006 requesting an early refill  on his Percocet.  At that time, on July 01, 2006, he stated he had 18  pills left.   He states today that his knee got better and he did not require the 4-5  pills a day which he typically takes.  He also called in for early refill on  April 08, 2006 and May 12, 2006.  Philip Gates states that his knee has  been bothering him lately.  He had to do some work under the house under the  crawl space putting in a dehumidifier.  This was approximately 2-3 weeks  ago.  He was doing a lot of squatting and kneeling.  He believes this may  have aggravated his right knee.  He states his average pain is about 4 on a  scale of 10.  It is constant, dull and aching in its nature.  He sleeps  well.  He gets good relief with the medications that he takes.  That is  Percocet 10/325 with 4-5 times per day.   He is able to climb stairs.  He is driving.  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  Denies any new problems in past medical, social or  family history.   On exam today, his blood pressure is 184/82, pulse 122, respirations 16, 95%  saturating on room air.  He is well-developed, well-nourished gentleman who  appears his stated age.  He is oriented x3.  His speech is clear.  His  affect is somewhat tired today.  He states that he was at [employer] last  night.  He follows commands without difficulty.  He states his speech is  clear.  He transitions from sit to stand without any difficulty.  He is able  to do a squat in the room.  His gait is symmetric.  Nonantalgic.  He has  symmetric stride  length.   Seated, I do not appreciate any evidence of joint effusion on the right.  He  does have some mild to moderate joint line tenderness today.  I do not  appreciate any crepitus.  He has full extension and flexion.  He has no  lateral or medial instability appreciated.  Lachman test is negative.   IMPRESSION:  1. Right knee pain:  History of chondromalacia patellae.  2. Moderate right medial joint line tenderness.  3. History of gastroesophageal reflux and nonsteroidal anti-inflammatory      medication intolerance.  4. Anxiety, overall improved with the use of Effexor.   PLAN:  Given that he has requested early refills over the last couple of  months, it has been several years since orthopedist as evaluated his right  knee, I would like him to follow up with Dr. Lequita Halt and possibly obtain  some imaging studies to determine whether or not he would be a candidate for  any type of procedure which would benefit him in management of his pain.  We  will give him a two-week supply of his Percocet today and when he comes in  at the  next visit in two weeks anticipate urine drug screen again.  We will  have a nursing visit in two weeks and I will see him back in one month.           ______________________________  [name], M.D.     DMK/MedQ  D:  07/13/2006 13:53:01  T:  07/13/2006 23:40:02  Job #:  [id number]

## 2011-01-24 NOTE — Op Note (Signed)
   NAME:  Philip Gates, Philip Gates                          ACCOUNT NO.:  [id number]   MEDICAL RECORD NO.:  [id number]                   PATIENT TYPE:  AMB   LOCATION:  ENDO                                 FACILITY:  [facility]   PHYSICIAN:  Danise Edge, M.D.                DATE OF BIRTH:  21-Oct-1955   DATE OF PROCEDURE:  12/24/2002  DATE OF DISCHARGE:                                 OPERATIVE REPORT   PROCEDURE:  Colonoscopy.   REFERRING PHYSICIAN:  Angus G. Renard Matter, M.D.   INDICATIONS:  The patient is a 55 year old male born 2056/02/05.  The patient  has intermittent diarrhea.  The diarrhea lessened when he was switched from  Nexium to Pepcid.  He recently had an attack of right upper quadrant  abdominal pain.  Ultrasound at the [facility] 12/22/02 was  normal; specifically, there were no kidney stones or gallstones.   ENDOSCOPIST:  Danise Edge, M.D.   PREMEDICATION:  Versed 10 mg, Demerol 100 mg.   DESCRIPTION OF PROCEDURE:  After obtaining informed consent, the patient was  placed in the left lateral decubitus position.  I administered intravenous  Demerol and intravenous Versed to achieve conscious sedation for the  procedure.  The patient's blood pressure, oxygen saturation, and cardiac  rhythm were monitored throughout the procedure and documented in the medical  record.   Anal inspection was normal.  Digital rectal exam was normal.  The Olympus  adult colonoscope was introduced into the rectum and advanced to the cecum.  A normal-appearing ileocecal valve was intubated and the distal ileum  inspected.  Colonic preparation for the exam today was excellent.   Rectum normal.   Sigmoid colon and descending colon normal.   Splenic flexure normal.   Transverse colon normal.   Hepatic flexure normal.   Ascending colon normal.   Cecum and ileocecal valve normal.   Distal ileum normal.   Colonic biopsies:  Random colonic biopsies were taken from the right  colon  and left colon in order to look for pathological signs of  microscopic/collagenous colitis.   ASSESSMENT:  Normal proctocolonoscopy to the cecum with distal ileoscopy.  Random colonic biopsies pending.   RECOMMENDATIONS:  Repeat screening colonoscopy with polypectomy to prevent  colon cancer in 10 years.                                               Danise Edge, M.D.    MJ/MEDQ  D:  12/23/2002  T:  12/24/2002  Job:  [id number]   cc:   Angus G. Renard Matter, M.D.  [address]  [place]  [place] [address]  Fax: [phone]   Jessica Priest, M.D.  [address]  [place] [address]  Fax: [phone]

## 2011-01-24 NOTE — Assessment & Plan Note (Signed)
Philip Gates is a 55 year old, married, white gentleman who is working full  time.  He is being seen in our pain and rehab clinic for chronic right knee  pain.  He states over the last 2 months he has had to wear steel toe shoes  to work.  When he wears the steel toes, he gets increased knee pain.  In  fact, last month he was given a trial of 20 mg of Kadian in addition to his  Percocet.  He states that he did not take his Percocet while he was on his  Kadian.  He noted that the 20 mg of Kadian was not that helpful in  alleviating his pain.   Typically, Philip Gates takes his medications approximately 5:30 a.m., 9 a.m.,  1 p.m., and 5 p.m.  The majority of his pain medication is taken within a 12-  hour period, and he basically substituted 20 mg of Kadian for the typical 40  mg of Percocet that he typically uses.   He states currently his average pain is about a 4 on a scale of 10.  His  knee seems to have gotten somewhat better over the last month.  He is  complaining, however, of constipation.  He also notes overall improvement  with the use of the Effexor.   His sleep is fair.  He gets good relief with the current meds as he takes  them.  He is able to be walking at least 60 minutes at a time.  He is able  to climb stairs.  He is driving again, working 40 hours a week.   No changes in past medical, social, or family history.   PHYSICAL EXAMINATION:  VITAL SIGNS:  Blood pressure 166/87, pulse 96,  respirations 16, 98% saturated on room air.  GENERAL:  Philip Gates is a well-developed, well-nourished gem who is alert,  oriented.  He is cooperative.  He transitions from sit-to-stand without any  difficulty.  His gait does not appear to be antalgic today.  MUSCULOSKELETAL:  Evaluation of the right lower extremity does not reveal  effusion.  No tenderness is noted around the joint line.  No crepitus is  noted.  No sensory deficits are noted.  He has full range of motion.  Motor  strength is  good.   IMPRESSION:  1.  Chondromalacia of the patella, right knee.  2.  History of gastroesophageal reflux and nonsteroidal anti-inflammatory      medication intolerance.  3.  Anxiety.   1.  Overall improvement with the use of Effexor.  We will keep him on it 1      more month.  Reassess in 1 month.  2.  May consider giving him a long-acting narcotic in addition to his      Percocet if he continues to have knee pain which inhibits his ability to      work.  3.  Would also recommend possibly evaluating him for the use of a rocker      bottom on his steel toe shoe.  We will discuss this further next visit.      We will assess how he is doing and make further management decisions in      the next month.           ______________________________  [name], M.D.     DMK/MedQ  D:  02/20/2006 14:49:20  T:  02/20/2006 15:46:13  Job #:  [id number]

## 2011-01-24 NOTE — Assessment & Plan Note (Signed)
HISTORY OF PRESENT ILLNESS:  [name] is back in for a recheck today.  He  was last seen Wednesday, September 20, 2003.  He is being currently treated  for a right knee osteoarthritis and chronic right knee pain.   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  He also is getting more active in doing some golf.  He reports  that his knee pain on the average is about a 5 on a scale to 10, it goes  down to a 2, and the worst pain in the last 24 hours is approximately a 5 on  a scale of 10 as well.  Denies any problems with swelling, popping, locking.  He does get some stiffness after he has been sitting for a while or does a  lot of bending.   He comes in with his medication count today.  He had been given 186 pills  for one month, taking them every four hours.  He comes back today and has 22  pills left over of the oxycodone.   He has tried topical agents such as Lidoderm and Biofreeze and has found  them not to be beneficial for him.  He has been on nonsteroidal  antiinflammatory medication.  However, he has a history of reflux and heart  disease and has not been able to tolerate NSAKDs.   Overall his activity level has been good.   MEDICATIONS AT THIS TIME:  1. Aciphex 20 mg one p.o. b.i.d.  2. Zetia 10 mg one p.o. daily.  3. Zantac 300 mg p.o. daily.  4. Vitamin C 500 mg daily.  5. Multivitamin daily.  6. Oxycodone 10/325 one p.o. q.4h.   PHYSICAL EXAMINATION TODAY:  His blood pressure was 143/88, pulse 87.  He  was 100% saturated on room air with the pulse oximeter.   He sits in no distress.  He is pleasant, cooperative.  He is able to get off  the exam table.  Gait is normal in the room.  He is able to do a deep knee  bend and squat and return back up.  He has full range of motion at his knee.  He is able to extend it to 180.   There is no medial or lateral joint laxity noted.  Lachman's test is  negative.  No swelling is noted.  He has  mild tenderness over the medial  joint line on the right.  Again, now, there is no edema around the knee at  all.  No crepitus is noted with flexion-extension today.   IMPRESSION:  Chronic knee pain; history of osteoarthritis, meniscal damage,  and chondromalacial patellae as well as patellar tendon tendinosis.   PLAN:  I spent approximately 20 minutes discussing treatment plan with Mr.  Gates.  Given the fact that he is taking between 40 and 50 of oxycodone each  day I suggest switching him to a longer-acting opioid.  He is very reluctant  to do this and becomes upset about the suggestion of switching him from his  current medication regime.  We discussed it quite a while.  He thinks that  he could take approximately three pills a day rather than the 4-6 that he is  taking and if he is able to cut back on it I would consider continuing him  on a short-acting opioid; however, if he has difficulty doing this I will  strongly encourage a switch to an opioid that  has a longer duration of  action.  Will see him back in 1 month.  He is going to try to take it not  more than three times a day.  He was given a prescription to allow him to  continue to take it every 4 hours if he feels he needs to.  We will do a  pill count at the next month and see how he is doing and reassess him then.  He was given Endocet 10/325 one p.o. q.4h. #186.      Philip Gates, M.D.   DMK/MedQ  D:  11/15/2003 12:08:53  T:  11/15/2003 12:46:05  Job #:  [id number]   cc:   Vania Rea. Supple, M.D.  [facility]  [address]  [address]  [place]  [place] [address]  Fax: [phone]   Loreta Ave, M.D.  [address]  [place] [address]  Fax: [phone]

## 2011-01-24 NOTE — Assessment & Plan Note (Signed)
[name] is back in for recheck today.  He is a 55 year old gentleman with  a history of chronic knee pain and a history of osteoarthritis, as well as  status post knee surgery.  He is unable to take nonsteroidal anti-  inflammatory medications.  He is a gentleman who functions at a very high  level and is doing some sports.  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  He participates in  all of the above activities.  He is currently taking Endocet 10/325 mg.  He  is taking only about four a day now.  We discussed the possibility of  putting him on longer acting narcotic analgesics to management his pain if  he continued to need it every four hours.  However, he has been able to  function, taking approximately just four a day and has been able to remain  active on this dose.  He reports some trouble with getting to sleep at  night.  Otherwise he has no new problems.  Denies any swelling, instability,  or locking of the knee.  He had been playing some golf recently and has  reported some increased medial joint pain with his golf swing.   PHYSICAL EXAMINATION:  On exam today, he is a well-developed, well-nourished  gentleman in no apparent distress.  He is appropriate and cooperative.  Examination of the knee does not reveal any swelling.  No AP or lateral  laxity was noted.  He continues to have some medial joint line tenderness in  the right knee.  No crepitus noted in the right knee.  He had some minimal  crepitus in the left, however.   IMPRESSION:  1. Chronic right knee pain.  2. History of osteoarthritis.  3. Insomnia.   PLAN:  1. Will try him on some Elavil 10 mg one p.o. q.h.s., #30.  2. Will refill his oxycodone 10/325 mg one p.o. q.i.d., #45.  3. Will see him back in one month.      [name], M.D.   DMK/MedQ  D:  12/13/2003 12:56:27  T:  12/13/2003 14:11:14  Job #:  [id number]

## 2011-01-24 NOTE — Assessment & Plan Note (Signed)
[name] is back in today for a recheck and does not need a refill of his  medications.  His last refill date was Feb 05, 2005.  He came in for a pill  count on Jan 20, 2005, and was appropriate for his count.   He is in today, and again he is appropriate with his medication count.   He is being seen our pain and rehabilitative clinic for chronic right knee  pain.  He has nonsteroidal anti-inflammatory medication intolerance and uses  oxycodone up to four to five times a day for his knee pain.  He remains  quite active, using his medication.  Has displayed no aberrant behavior  while on it.  His average pain is 3 on a scale of 10.  Currently it is about  a 5.  He reports he has been rather busy.  He moved into a new house and  reports he has some increased medial knee pain today.  He is also quite  tired from increased activities over the last couple of weeks.  He was able  to play golf on Memorial Day, overall did okay.  He developed some increased  knee pain later in the game.   Knee pain is typically intermittent, dull and sharp variably.  Sleep is  overall good.  He gets good relief with his medications.  He is able to walk  about 30 minutes without any difficulty.  He is employed 40 hours a week.   No changes in review of systems.  No suicidal ideation, no depression or  anxiety noted.   No change in past medical, social or family history.   PHYSICAL EXAMINATION:  VITAL SIGNS:  Blood pressure 124/80, pulse 91,  respirations 16, 94-96% saturated on room air.  GENERAL:  A well-developed, well-nourished gentleman in no apparent  distress.  He is oriented x3.  He is a little bit tired today.  His affect  is overall bright and alert; however, when he stands up, he initially has a  slightly antalgic gait, decreased weightbearing on the right.  Seated,  flexion-extension in the knee are full, range of motion is full.  He has no  crepitus today.  He has some mild medial joint line  tenderness to palpation.  No effusion is noted.   IMPRESSION:  1.  Chondromalacia patellae, advanced, of the right knee.  2.  History of gastroesophageal reflux disease and nonsteroidal anti-      inflammatory drug intolerance.   No medications dispensed today.  The patient is stable.  Will see him back  in a month.  Will do a routine UDS at the next visit.       DMK/MedQ  D:  02/14/2005 10:58:30  T:  02/14/2005 13:48:46  Job #:  [id number]

## 2011-01-24 NOTE — Assessment & Plan Note (Signed)
MEDICAL RECORD NUMBER:  [id number].   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  He has a history of chronic right knee problems status post surgery and has  advanced chondromalacia of the patellae.   He has continued to stay quite active, working 40 hours a week, has been  able to get into two rounds of golf since I last saw him.   His average pain unmedicated is about between an 8 and 10 on a scale of 10.  Medicated, he gets quite good relief with his oxycodone taking it on the  average four times a day. His pain scores go down to between a 4 and a 6  with medications.   His sleep is overall good. Pain is exacerbated by bending type activities at  the knee and prolonged sitting. It improves with rest and medications. He  really does not have a lot of pain at night.   He describes his pain as intermittent, dull, and aching. Denies any problems  in the neuropsychic review of systems, and generalized review of systems is  negative.   Past medical history, social, and family history reveal no changes since  last visit.   PHYSICAL EXAMINATION:  Blood pressure 132/75, pulse 86, respirations 16, 96%  saturated on room air. Well-developed, well-nourished gentleman in no  apparent distress this morning. He is oriented x3. His affect is overall  bright and alert and cooperative.   He is able to stand from a seated position independently. His gait is  entirely normal in the room. He is able to do a squat; however, he does not  bear a lot of weight through the right leg as he does his squats.   Seated, right knee is examined. He has medial joint line tenderness and  tenderness into the pes anserine region. No joint line tenderness on the  lateral aspect of the knee. He has just a little bit of crepitus at end  range with flexion and extension of his knee.   IMPRESSION:  1.  Advanced chondromalacia, patella of the right knee.  2.  History of  gastroesophageal reflux disease and has nonsteroidal anti-      inflammatory agent intolerance.   PLAN:  We will refill his oxycodone 10/325 one p.o. q.4-6h. #140. We will  also give him a note stating that he has been seen in our clinic this  morning. We will see him back in a month.   We had discussed possibly reducing him from 10/325 down to 7.5. He is not  sure that this will help him with his pain and allow him to stay as active  he would like to. I reviewed the fact that he has been on Tylenol many years  and needs to make sure he is not drinking any alcohol. He denies any alcohol  use. I just wanted to review this with him again today. May consider a liver  function test, just in light of the fact that he has been acetaminophen for  so many years.    DMK/MedQ  D:  11/15/2004 09:19:57  T:  11/15/2004 13:32:13  Job #:  [id number]

## 2011-01-24 NOTE — Assessment & Plan Note (Signed)
HISTORY:  Philip Gates is a 55 year old gentleman with a history of chronic  knee pain and OA of both knees; he is status post knee surgery.  He is  unable to take nonsteroidal anti-inflammatory medication and he is currently  on Aciphex as well as Zantac.   His knee pain is controlled with oxycodone 10 mg four times a day.  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  He is active in his church as well.  He stays quite  busy.   He reports he has been fairly well controlled on his current medication.   PHYSICAL EXAMINATION:  On exam today his blood pressure is 150/84, pulse  108, respirations 14, 98% saturated on room air, he is alert, oriented, no  apparent distress, appropriate and cooperative.  On exam knees reveal no  edema, normal coloration in the lower extremities, it is noted that he has  no joint line tenderness in either knee, he has crepitus bilaterally (left  worse than right with flexion-extension), no anterolateral or medial lateral  instability is noted.   IMPRESSION:  1. Chronic right knee pain.  2. History of osteoarthritis.  3. Insomnia.   PLAN:  We will refill his oxycodone/acetaminophen 10/325 one p.o. once daily  (#130).  We will see him back in 1 month.  We also discussed having him see  Philip Gates over at [facility] to be evaluated in his particular  sport which is golf.  The phone number over there is [phone].  Philip Gates  tells me he has been a little too busy to pursue this at this time but may  consider it in the future.      Philip Gates, M.D.   DMK/MedQ  D:  01/10/2004 13:39:14  T:  01/10/2004 15:52:26  Job #:  [id number]

## 2011-09-30 ENCOUNTER — Telehealth: Payer: Self-pay

## 2011-09-30 NOTE — Telephone Encounter (Signed)
LMOM to call. ( per e-chart..pt had last colonoscopy on 12/24/2002 by Dr. Danise Edge at [facility]. Next is due in 10 years and that would be April in 2014).

## 2011-10-10 NOTE — Telephone Encounter (Signed)
Letter mailed to pt and copy faxed to Dr. Dwana Melena.

## 2011-10-10 NOTE — Telephone Encounter (Signed)
LMOM to call.

## 2012-12-02 ENCOUNTER — Other Ambulatory Visit: Payer: Self-pay | Admitting: Sports Medicine

## 2012-12-02 DIAGNOSIS — M25552 Pain in left hip: Secondary | ICD-10-CM

## 2012-12-06 ENCOUNTER — Ambulatory Visit
Admission: RE | Admit: 2012-12-06 | Discharge: 2012-12-06 | Disposition: A | Discharge status: Home / Self Care | Payer: 59 | Source: Ambulatory Visit | Attending: Sports Medicine | Admitting: Sports Medicine

## 2012-12-06 DIAGNOSIS — M25552 Pain in left hip: Secondary | ICD-10-CM

## 2012-12-06 MED ORDER — METHYLPREDNISOLONE ACETATE 40 MG/ML INJ SUSP (RADIOLOG
120.0000 mg | Freq: Once | INTRAMUSCULAR | Status: AC
  Administered 2012-12-06: 120 mg via INTRA_ARTICULAR

## 2012-12-06 MED ORDER — IOHEXOL 180 MG/ML  SOLN
1.0000 mL | Freq: Once | INTRAMUSCULAR | Status: AC | PRN
  Administered 2012-12-06: 1 mL via INTRA_ARTICULAR

## 2012-12-16 ENCOUNTER — Other Ambulatory Visit: Payer: Self-pay | Admitting: Orthopedic Surgery

## 2012-12-16 MED ORDER — BUPIVACAINE LIPOSOME 1.3 % IJ SUSP: 20.0000 mL | Freq: Once | INTRAMUSCULAR | Status: DC

## 2012-12-16 MED ORDER — DEXAMETHASONE SODIUM PHOSPHATE 10 MG/ML IJ SOLN: 10.0000 mg | Freq: Once | INTRAMUSCULAR | Status: DC

## 2012-12-16 NOTE — Progress Notes (Signed)
Preoperative surgical orders have been place into the [facility] hospital system for Philip Gates on 12/16/2012, 5:41 PM  by Patrica Duel for surgery on 01/03/2013.  Preop Total Hip - Anterior Approach orders including Experel Injecion, IV Tylenol, and IV Decadron as long as there are no contraindications to the above medications. Avel Peace, PA-C

## 2012-12-16 NOTE — Progress Notes (Signed)
Surgery scheduled for 01/03/13.  Preop appointment on 12/27/12 at 1000am.  Need orders in EPIC.  Thanks.

## 2012-12-21 ENCOUNTER — Encounter (HOSPITAL_COMMUNITY): Payer: Self-pay | Admitting: Pharmacy Technician

## 2012-12-27 ENCOUNTER — Encounter (HOSPITAL_COMMUNITY)
Admission: RE | Admit: 2012-12-27 | Discharge: 2012-12-27 | Disposition: A | Discharge status: Home / Self Care | Payer: 59 | Source: Ambulatory Visit | Attending: Orthopedic Surgery | Admitting: Orthopedic Surgery

## 2012-12-27 ENCOUNTER — Ambulatory Visit (HOSPITAL_COMMUNITY)
Admission: RE | Admit: 2012-12-27 | Discharge: 2012-12-27 | Disposition: A | Discharge status: Home / Self Care | Payer: 59 | Source: Ambulatory Visit | Attending: Orthopedic Surgery | Admitting: Orthopedic Surgery

## 2012-12-27 ENCOUNTER — Other Ambulatory Visit: Payer: Self-pay

## 2012-12-27 ENCOUNTER — Encounter (HOSPITAL_COMMUNITY): Payer: Self-pay

## 2012-12-27 DIAGNOSIS — M169 Osteoarthritis of hip, unspecified: Secondary | ICD-10-CM | POA: Insufficient documentation

## 2012-12-27 DIAGNOSIS — J45909 Unspecified asthma, uncomplicated: Secondary | ICD-10-CM | POA: Insufficient documentation

## 2012-12-27 DIAGNOSIS — Z01812 Encounter for preprocedural laboratory examination: Secondary | ICD-10-CM | POA: Insufficient documentation

## 2012-12-27 DIAGNOSIS — M1610 Unilateral primary osteoarthritis, unspecified hip: Secondary | ICD-10-CM | POA: Insufficient documentation

## 2012-12-27 DIAGNOSIS — R9431 Abnormal electrocardiogram [ECG] [EKG]: Secondary | ICD-10-CM | POA: Insufficient documentation

## 2012-12-27 DIAGNOSIS — Z01810 Encounter for preprocedural cardiovascular examination: Secondary | ICD-10-CM | POA: Insufficient documentation

## 2012-12-27 DIAGNOSIS — Z01818 Encounter for other preprocedural examination: Secondary | ICD-10-CM | POA: Insufficient documentation

## 2012-12-27 HISTORY — DX: Other specified disorders of nose and nasal sinuses: J34.89

## 2012-12-27 HISTORY — DX: Gastro-esophageal reflux disease without esophagitis: K21.9

## 2012-12-27 HISTORY — DX: Other chronic pain: G89.29

## 2012-12-27 HISTORY — DX: Bipolar disorder, unspecified: F31.9

## 2012-12-27 HISTORY — DX: Unspecified osteoarthritis, unspecified site: M19.90

## 2012-12-27 HISTORY — DX: Cervicalgia: M54.2

## 2012-12-27 HISTORY — DX: Unspecified asthma, uncomplicated: J45.909

## 2012-12-27 LAB — CBC
MCH: 30.4 pg (ref 26.0–34.0)
MCHC: 33.6 g/dL (ref 30.0–36.0)
MCV: 90.3 fL (ref 78.0–100.0)
Platelets: 225 10*3/uL (ref 150–400)
RBC: 5.14 MIL/uL (ref 4.22–5.81)
WBC: 6.4 10*3/uL (ref 4.0–10.5)

## 2012-12-27 LAB — COMPREHENSIVE METABOLIC PANEL
AST: 17 U/L (ref 0–37)
CO2: 33 mEq/L — ABNORMAL HIGH (ref 19–32)
Calcium: 10 mg/dL (ref 8.4–10.5)
Creatinine, Ser: 0.94 mg/dL (ref 0.50–1.35)
GFR calc Af Amer: >90 mL/min (ref >90)
GFR calc non Af Amer: >90 mL/min (ref >90)
Glucose, Bld: 95 mg/dL (ref 70–99)
Sodium: 141 mEq/L (ref 135–145)
Total Protein: 7.6 g/dL (ref 6.0–8.3)

## 2012-12-27 LAB — URINALYSIS, ROUTINE W REFLEX MICROSCOPIC
Ketones, ur: NEGATIVE mg/dL
Leukocytes, UA: NEGATIVE
Nitrite: NEGATIVE
Protein, ur: NEGATIVE mg/dL

## 2012-12-27 LAB — SURGICAL PCR SCREEN: MRSA, PCR: NEGATIVE

## 2012-12-27 LAB — APTT: aPTT: 33 seconds (ref 24–37)

## 2012-12-27 LAB — PROTIME-INR: INR: 0.94 (ref 0.00–1.49)

## 2012-12-27 IMAGING — CR DG CHEST 2V
3 series · 3 of 3 positions shown · non-contrast
Comparison: PA and lateral chest [DATE].

CLINICAL DATA: Preoperative respiratory films.  Patient for left
hip replacement.

CHEST - 2 VIEW

[w chest pa]
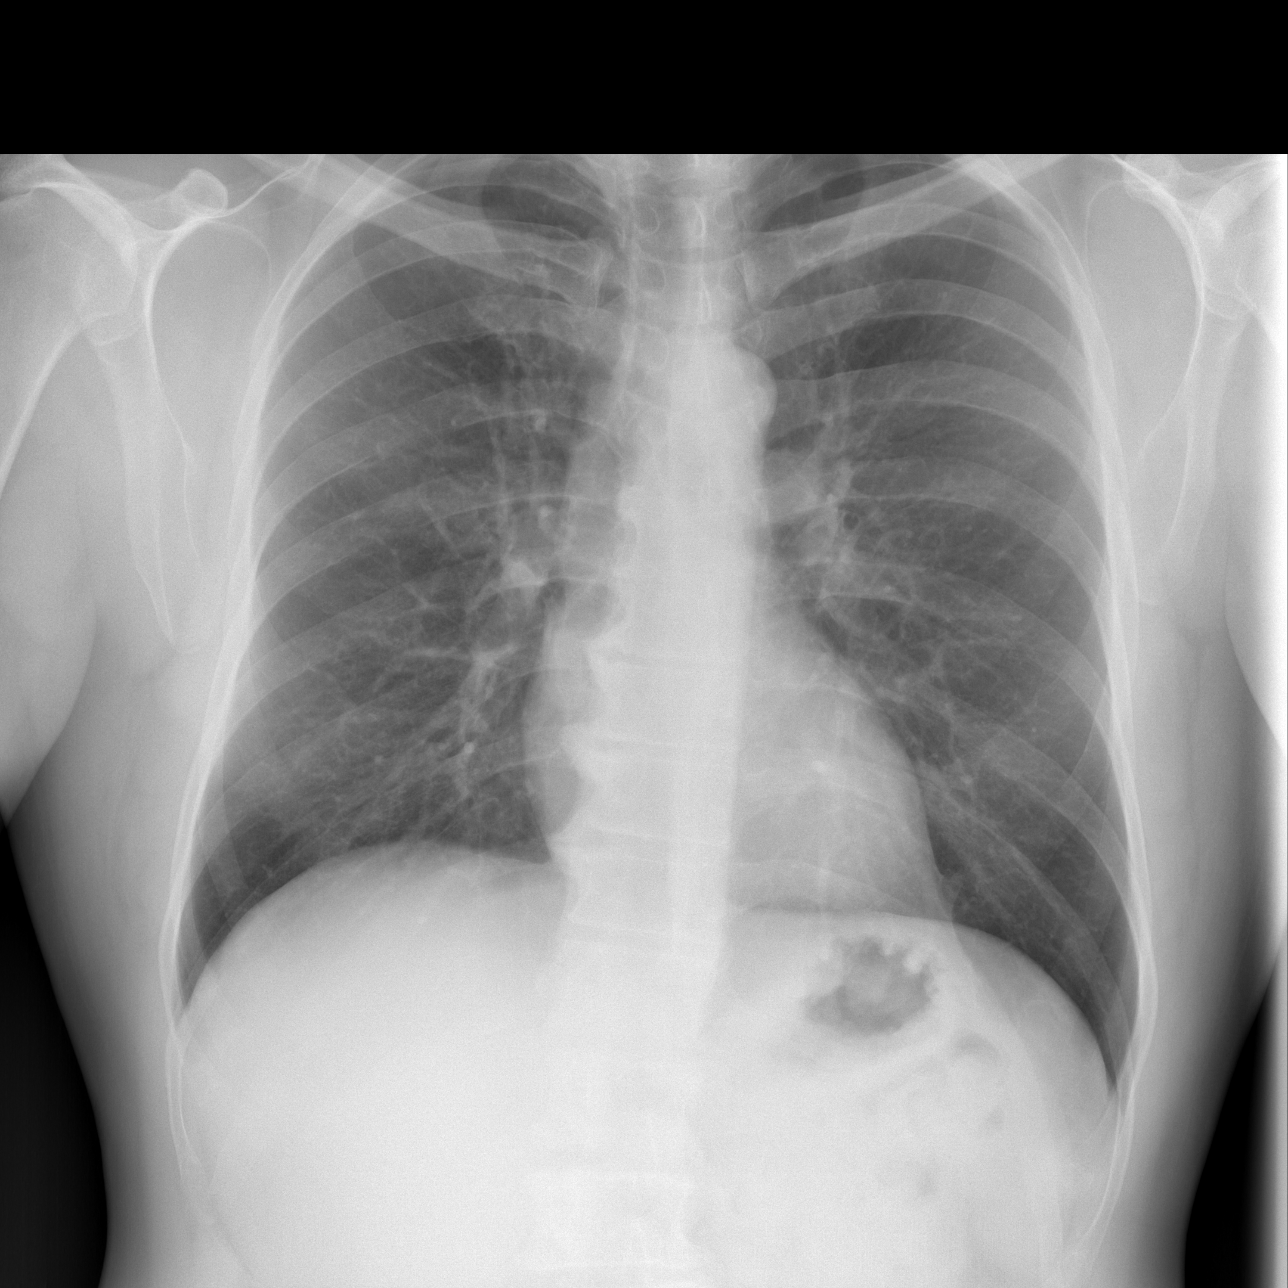

[w chest lat]
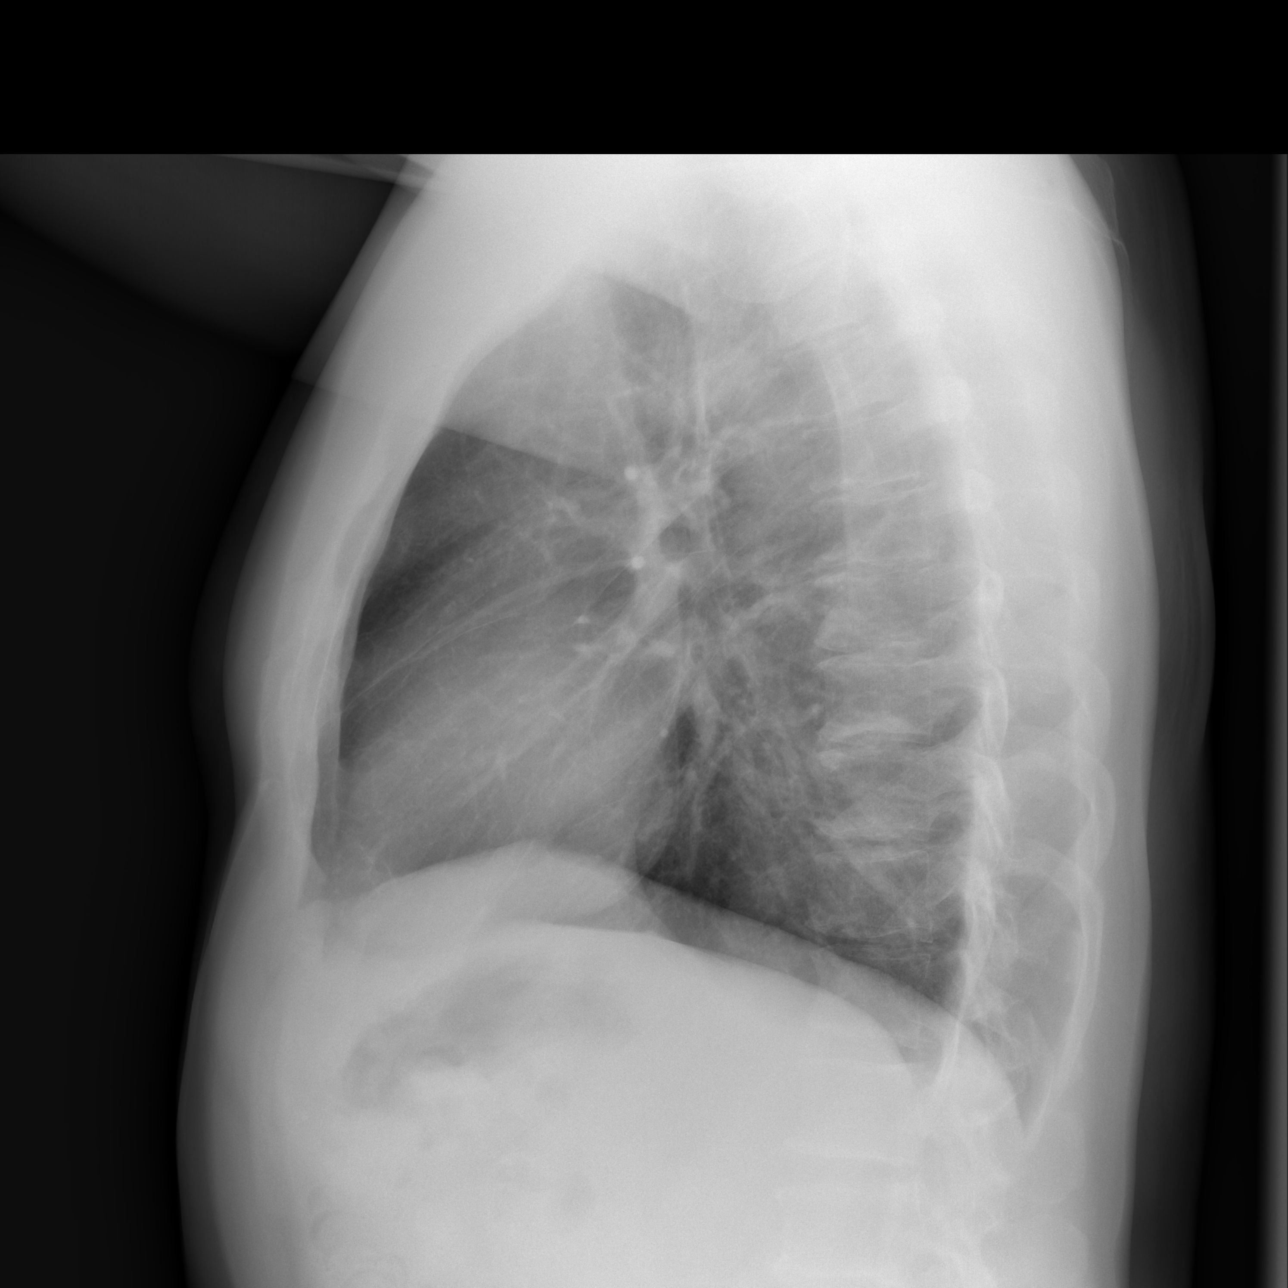

[w chest ap]
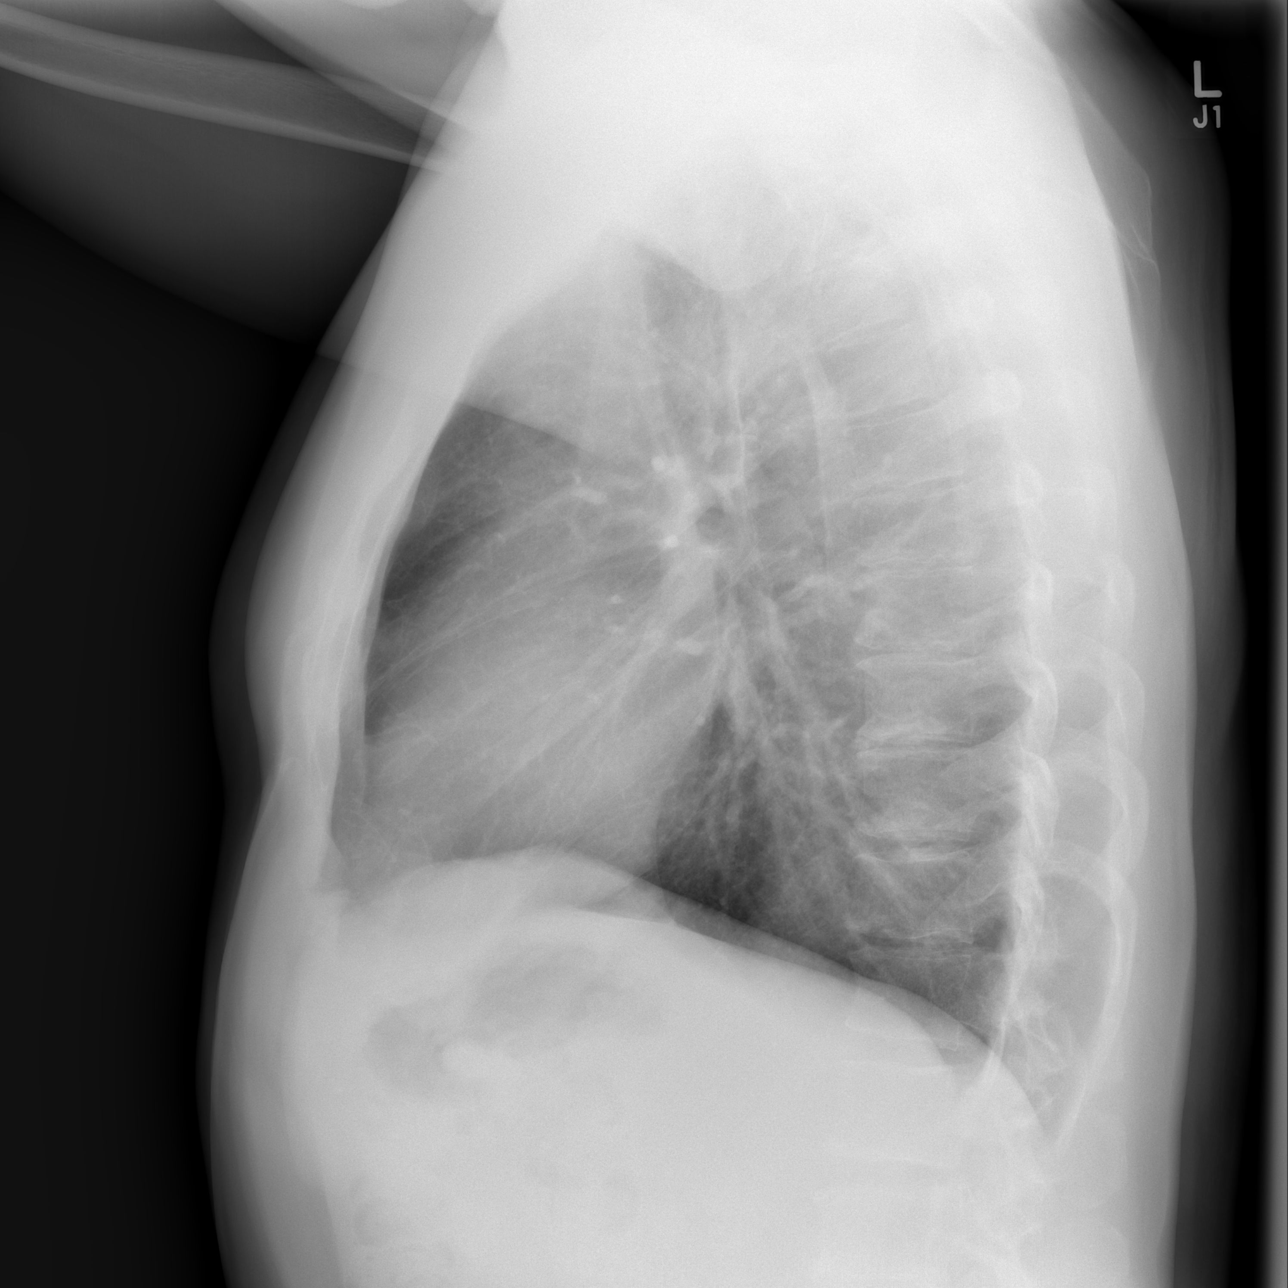

[3 of 3 positions shown; findings below may reference images not displayed]

FINDINGS: Lungs are clear.  Heart size is normal.  No pneumothorax
or pleural fluid.
IMPRESSION: Negative chest.

## 2012-12-27 IMAGING — CR DG HIP (WITH OR WITHOUT PELVIS) 2-3V*L*
3 series · 3 of 3 positions shown · non-contrast
Comparison: None.

CLINICAL DATA: Preoperative films.  Left hip replacement.

LEFT HIP - COMPLETE 2+ VIEW

[t pelvis a.p.]
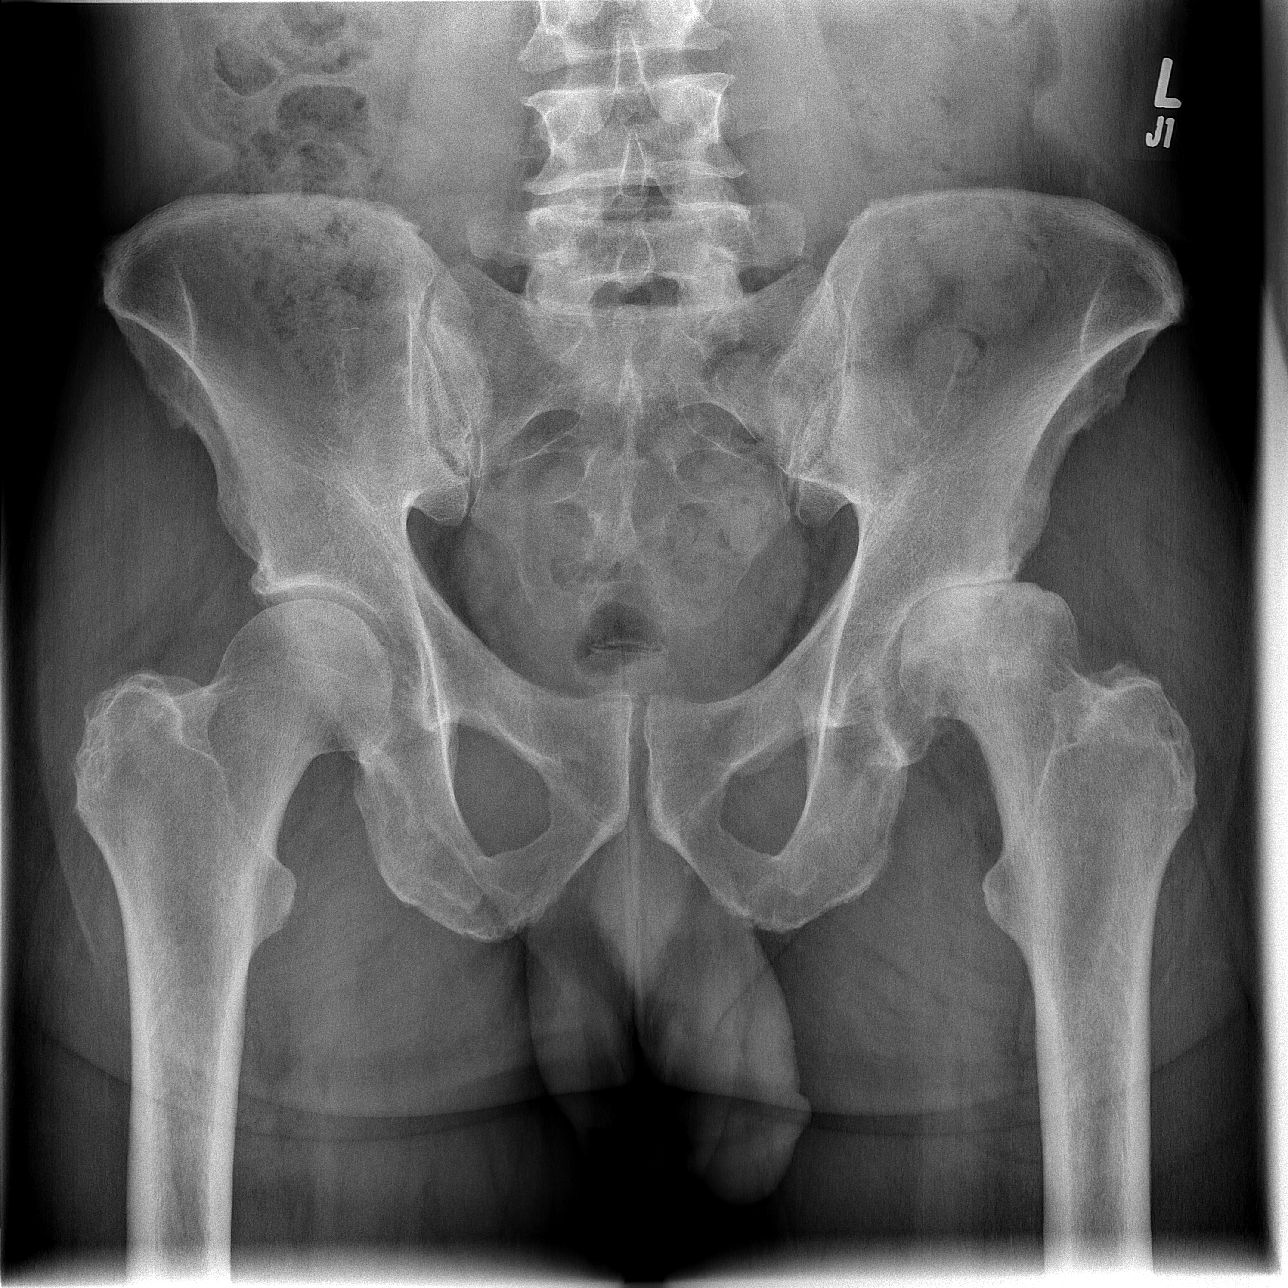

[t hip ap left]
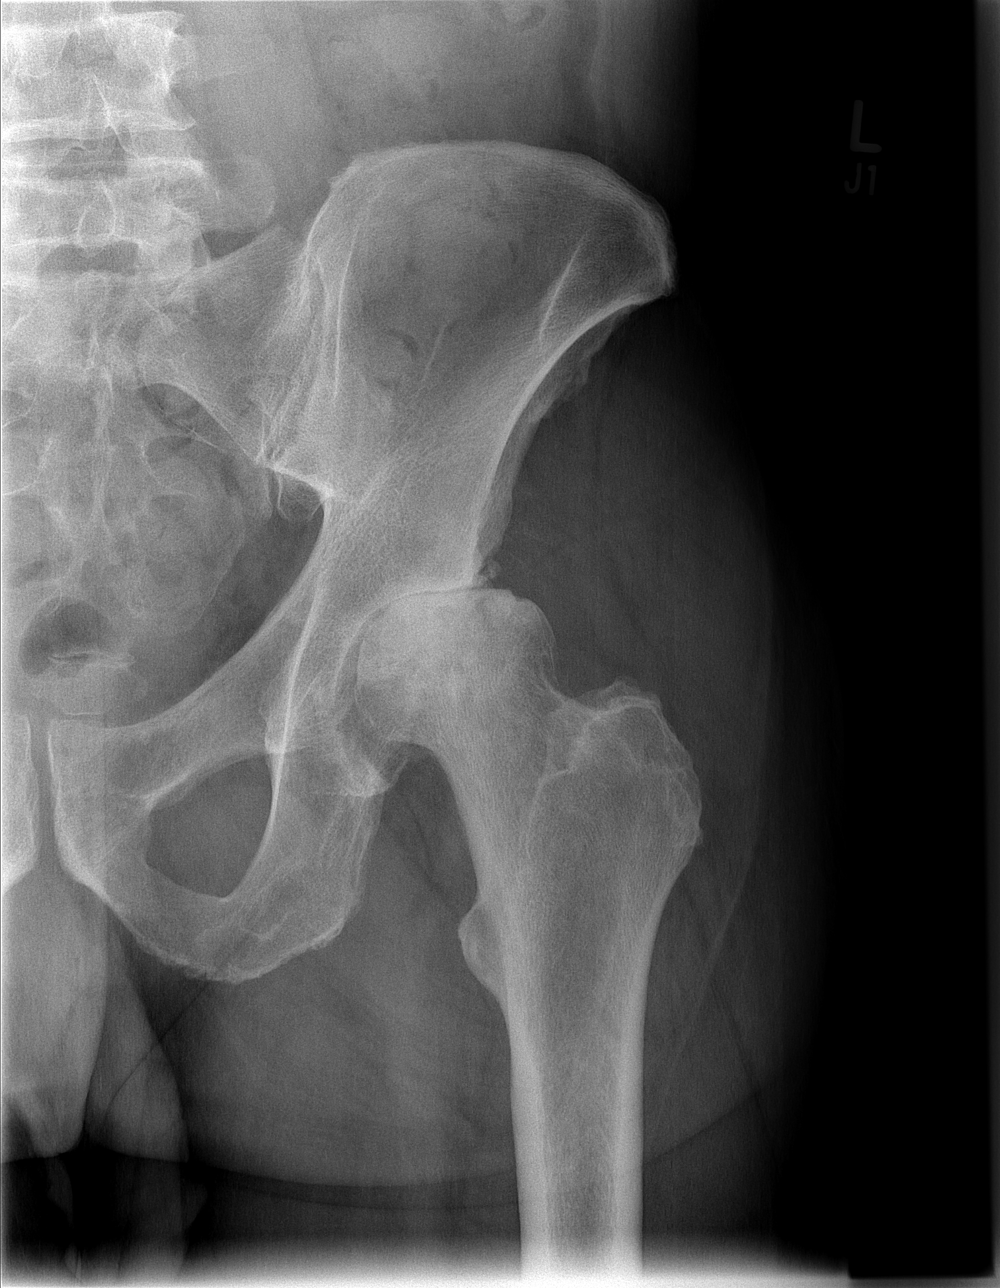

[t hip frog leg left]
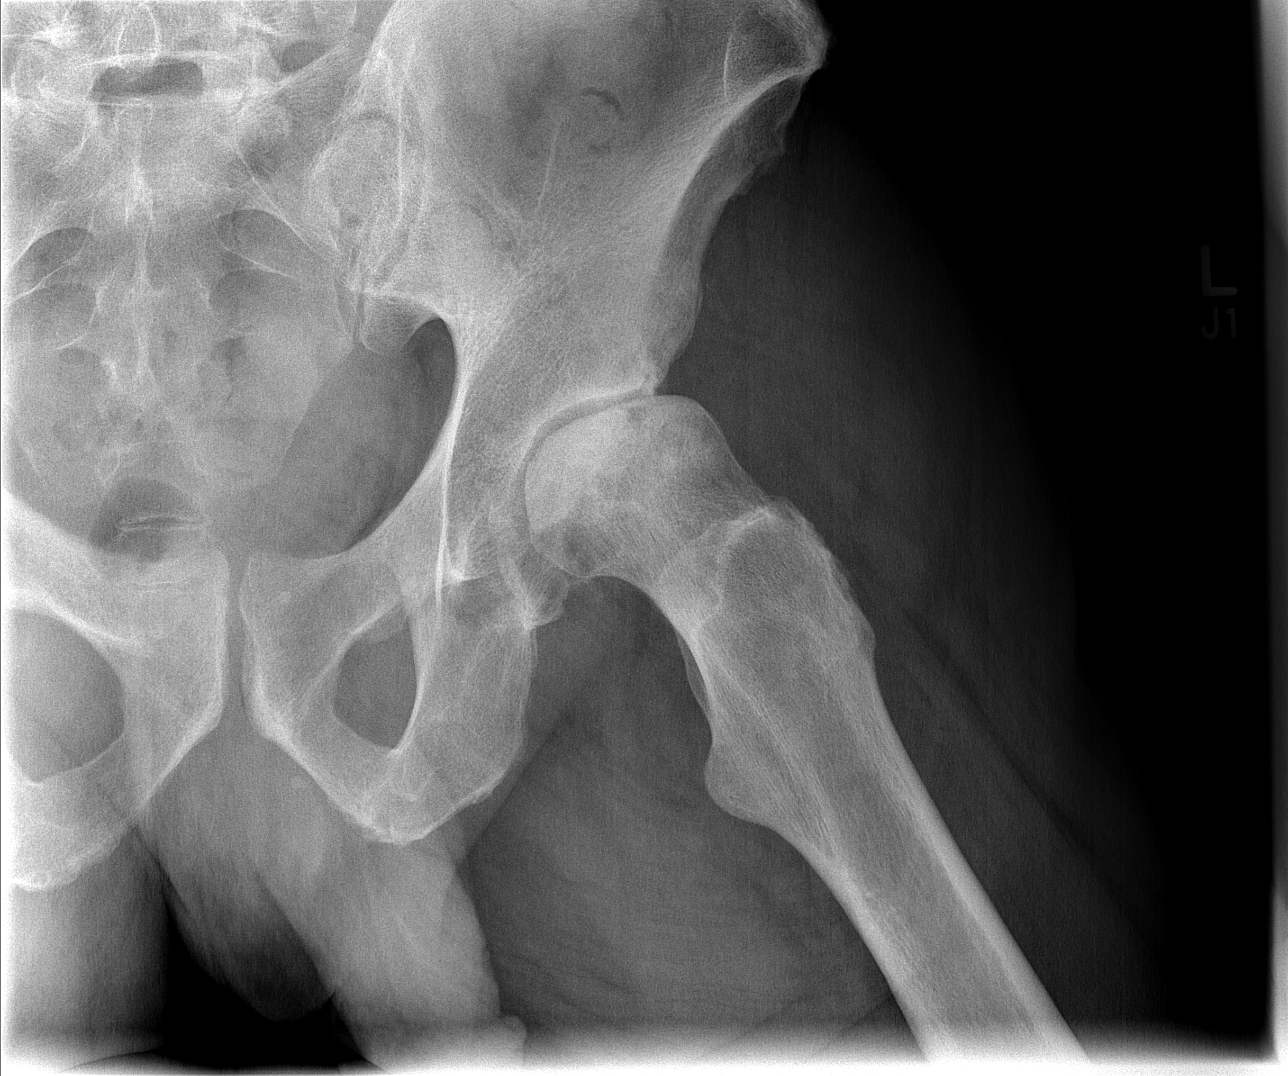

[3 of 3 positions shown; findings below may reference images not displayed]

FINDINGS: The patient has severe left hip osteoarthritis with bone-
on-bone joint space narrowing and remodeling of the left femoral
head.  No fracture or dislocation is identified.
IMPRESSION: No acute finding.  Severe with severe left hip degenerative change.

## 2012-12-27 NOTE — Patient Instructions (Addendum)
Philip Gates  12/27/2012                           YOUR PROCEDURE IS SCHEDULED ON:  01/03/13               PLEASE REPORT TO SHORT STAY CENTER AT :  12:45 PM               CALL THIS NUMBER IF ANY PROBLEMS THE DAY OF SURGERY :               [phone]                      REMEMBER:   Do not eat food  AFTER MIDNIGHT  May have clear liquids UNTIL 6 HOURS BEFORE SURGERY  (9:45 AM)  Clear liquids include soda, tea, black coffee, apple or grape juice, broth.  Take these medicines the morning of surgery with A SIP OF WATER:  DEXLANT / SINGULAIR /  MAY USE ATROVENT /HYDROCODONE / XOPENEX IF NEEDED / MAY USE RESTASIS EYE DROPS   Do not wear jewelry, make-up   Do not wear lotions, powders, or perfumes.   Do not shave legs or underarms 12 hrs. before surgery (men may shave face)  Do not bring valuables to the hospital.  Contacts, dentures or bridgework may not be worn into surgery.  Leave suitcase in the car. After surgery it may be brought to your room.  For patients admitted to the hospital more than one night, checkout time is 11:00                          The day of discharge.   Patients discharged the day of surgery will not be allowed to drive home                             If going home same day of surgery, must have someone stay with you first                           24 hrs at home and arrange for some one to drive you home from hospital.    Special Instructions:   Please read over the following fact sheets that you were given:               1. MRSA  INFORMATION                      2. [facility] PREPARING FOR SURGERY SHEET               3.  INCENTIVE SPIROMETER                                                X_____________________________________________________________________        Failure to follow these instructions may result in cancellation of your surgery

## 2012-12-27 NOTE — Progress Notes (Signed)
UA faxed to Dr. Lequita Halt thru [facility]

## 2012-12-27 NOTE — Progress Notes (Signed)
12/27/12 1018  OBSTRUCTIVE SLEEP APNEA  Have you ever been diagnosed with sleep apnea through a sleep study? No  Do you snore loudly (loud enough to be heard through closed doors)?  1  Do you often feel tired, fatigued, or sleepy during the daytime? 1  Has anyone observed you stop breathing during your sleep? 0  Do you have, or are you being treated for high blood pressure? 0  BMI more than 35 kg/m2? 0  Age over 56 years old? 1  Neck circumference greater than 40 cm/18 inches? 0  Gender: 1  Obstructive Sleep Apnea Score 4  Score 4 or greater  Results sent to PCP

## 2013-01-02 ENCOUNTER — Other Ambulatory Visit: Payer: Self-pay | Admitting: Surgical

## 2013-01-02 NOTE — H&P (Signed)
TOTAL HIP ADMISSION H&P  Patient is admitted for left total hip arthroplasty.  Subjective:  Chief Complaint: left hip pain  HPI: Philip Gates, 57 y.o. male, has a history of pain and functional disability in the left hip(s) due to arthritis and patient has failed non-surgical conservative treatments for greater than 12 weeks to include NSAID's and/or analgesics, corticosteriod injections and activity modification.  Onset of symptoms was abrupt starting 1 year ago with rapidlly worsening course since that time.The patient noted no past surgery on the left hip(s).  Patient currently rates pain in the left hip at 7 out of 10 with activity. Patient has night pain, worsening of pain with activity and weight bearing, pain that interfers with activities of daily living and pain with passive range of motion. Patient has evidence of periarticular osteophytes and joint space narrowing by imaging studies. This condition presents safety issues increasing the risk of falls.  There is no current active infection.   Past Medical History  Diagnosis Date  . Asthma     ACTIVITY INDUCED  . Sinus drainage   . Arthritis   . GERD (gastroesophageal reflux disease)   . Bipolar 1 disorder   . Neck pain, chronic     Past Surgical History  Procedure Laterality Date  . Nose surgery       Current outpatient prescriptions:cycloSPORINE (RESTASIS) 0.05 % ophthalmic emulsion, Place 1 drop into both eyes 2 (two) times daily., Disp: , Rfl: ;  dexlansoprazole (DEXILANT) 60 MG capsule, Take 60 mg by mouth daily., Disp: , Rfl: ;  divalproex (DEPAKOTE ER) 500 MG 24 hr tablet, Take 1,000 mg by mouth at bedtime., Disp: , Rfl:  HYDROcodone-acetaminophen (LORTAB) 7.5-500 MG per tablet, Take 1 tablet by mouth every 6 (six) hours as needed for pain., Disp: , Rfl: ;  ibuprofen (ADVIL,MOTRIN) 200 MG tablet, Take 200 mg by mouth every 6 (six) hours as needed for pain., Disp: , Rfl: ;  ipratropium (ATROVENT) 0.03 % nasal spray, Place 2  sprays into the nose every 12 (twelve) hours as needed (allergies)., Disp: , Rfl:  levalbuterol (XOPENEX HFA) 45 MCG/ACT inhaler, Inhale 1-2 puffs into the lungs every 4 (four) hours as needed for wheezing., Disp: , Rfl: ;  montelukast (SINGULAIR) 10 MG tablet, Take 10 mg by mouth daily as needed (for allergies)., Disp: , Rfl: ;  RABEprazole (ACIPHEX) 20 MG tablet, Take 20 mg by mouth daily., Disp: , Rfl: ;  tadalafil (CIALIS) 20 MG tablet, Take 20 mg by mouth daily as needed for erectile dysfunction., Disp: , Rfl:  testosterone cypionate (DEPOTESTOTERONE CYPIONATE) 100 MG/ML injection, Inject 100 mg into the muscle every 14 (fourteen) days. For IM use only, Disp: , Rfl:   Allergies  Allergen Reactions  . Shellfish Allergy Other (See Comments)    asthma  . Penicillins Rash  . Sulfa Antibiotics Rash    History  Substance Use Topics  . Smoking status: Former Smoker    Quit date: 12/28/2002  . Smokeless tobacco: Not on file  . Alcohol Use: No    Family History Father living age 80; HTN, heart disease, DM Mother living age 69; HTN, heart disease   Review of Systems  Constitutional: Negative.   HENT: Positive for hearing loss and tinnitus. Negative for ear pain, nosebleeds, congestion, sore throat, neck pain and ear discharge.   Eyes: Negative.   Respiratory: Negative.  Negative for stridor.   Cardiovascular: Negative.   Gastrointestinal: Positive for heartburn. Negative for nausea, vomiting, abdominal pain,  diarrhea, constipation, blood in stool and melena.  Genitourinary: Negative.   Musculoskeletal: Positive for joint pain. Negative for myalgias, back pain and falls.       Left hip pain  Skin: Negative.   Neurological: Negative.  Negative for headaches.  Endo/Heme/Allergies: Negative.   Psychiatric/Behavioral: Negative.     Objective:  Physical Exam  Constitutional: He is oriented to person, place, and time. He appears well-developed and well-nourished. No distress.  HENT:   Head: Normocephalic and atraumatic.  Right Ear: External ear normal.  Left Ear: External ear normal.  Nose: Nose normal.  Mouth/Throat: Oropharynx is clear and moist.  Eyes: Conjunctivae and EOM are normal.  Neck: Normal range of motion. Neck supple. No tracheal deviation present. No thyromegaly present.  Cardiovascular: Normal rate, regular rhythm, normal heart sounds and intact distal pulses.   No murmur heard. Respiratory: Effort normal. No respiratory distress. He has no wheezes. He exhibits no tenderness.  GI: Soft. Bowel sounds are normal. He exhibits no distension and no mass. There is no tenderness.  Musculoskeletal:       Right hip: Normal.       Left hip: He exhibits decreased range of motion and decreased strength.       Right knee: Normal.       Left knee: Normal.       Right lower leg: He exhibits no tenderness and no swelling.       Left lower leg: He exhibits no tenderness and no swelling.  The right hip normal range of motion without discomfort. Left hip flexion 90, no internal rotation, about 20 external rotation, 20 abduction.  Lymphadenopathy:    He has no cervical adenopathy.  Neurological: He is alert and oriented to person, place, and time. He has normal strength and normal reflexes. No sensory deficit.  Skin: No rash noted. He is not diaphoretic. No erythema.  Psychiatric: He has a normal mood and affect. His behavior is normal.     Vitals Weight: 190 lb Height: 71 in Body Surface Area: 2.08 m Body Mass Index: 26.5 kg/m Pulse: 88 (Regular) BP: 132/78 (Sitting, Left Arm, Standard)  Imaging Review Plain radiographs demonstrate severe degenerative joint disease of the left hip(s). The bone quality appears to be good for age and reported activity level.  Assessment/Plan:  End stage arthritis, left hip(s)  The patient history, physical examination, clinical judgement of the provider and imaging studies are consistent with end stage  degenerative joint disease of the left hip(s) and total hip arthroplasty is deemed medically necessary. The treatment options including medical management, injection therapy, arthroscopy and arthroplasty were discussed at length. The risks and benefits of total hip arthroplasty were presented and reviewed. The risks due to aseptic loosening, infection, stiffness, dislocation/subluxation,  thromboembolic complications and other imponderables were discussed.  The patient acknowledged the explanation, agreed to proceed with the plan and consent was signed. Patient is being admitted for inpatient treatment for surgery, pain control, PT, OT, prophylactic antibiotics, VTE prophylaxis, progressive ambulation and ADL's and discharge planning.The patient is planning to be discharged home with home health services   [place], [place]

## 2013-01-03 ENCOUNTER — Inpatient Hospital Stay (HOSPITAL_COMMUNITY)
Admission: RE | Admit: 2013-01-03 | Discharge: 2013-01-05 | DRG: 470 | Disposition: A | Discharge status: Home Health | Payer: 59 | Source: Ambulatory Visit | Attending: Orthopedic Surgery | Admitting: Orthopedic Surgery

## 2013-01-03 ENCOUNTER — Inpatient Hospital Stay (HOSPITAL_COMMUNITY): Payer: 59

## 2013-01-03 ENCOUNTER — Encounter (HOSPITAL_COMMUNITY)
Admission: RE | Disposition: A | Discharge status: Home Health | Payer: Self-pay | Source: Ambulatory Visit | Attending: Orthopedic Surgery

## 2013-01-03 ENCOUNTER — Encounter (HOSPITAL_COMMUNITY): Payer: Self-pay | Admitting: *Deleted

## 2013-01-03 ENCOUNTER — Ambulatory Visit (HOSPITAL_COMMUNITY): Payer: 59

## 2013-01-03 ENCOUNTER — Encounter (HOSPITAL_COMMUNITY): Payer: Self-pay | Admitting: Anesthesiology

## 2013-01-03 ENCOUNTER — Ambulatory Visit (HOSPITAL_COMMUNITY): Payer: 59 | Admitting: Anesthesiology

## 2013-01-03 DIAGNOSIS — M1610 Unilateral primary osteoarthritis, unspecified hip: Principal | ICD-10-CM | POA: Diagnosis present

## 2013-01-03 DIAGNOSIS — J45909 Unspecified asthma, uncomplicated: Secondary | ICD-10-CM | POA: Diagnosis present

## 2013-01-03 DIAGNOSIS — Z880 Allergy status to penicillin: Secondary | ICD-10-CM

## 2013-01-03 DIAGNOSIS — K219 Gastro-esophageal reflux disease without esophagitis: Secondary | ICD-10-CM | POA: Diagnosis present

## 2013-01-03 DIAGNOSIS — F319 Bipolar disorder, unspecified: Secondary | ICD-10-CM | POA: Diagnosis present

## 2013-01-03 DIAGNOSIS — Z87891 Personal history of nicotine dependence: Secondary | ICD-10-CM

## 2013-01-03 DIAGNOSIS — M169 Osteoarthritis of hip, unspecified: Secondary | ICD-10-CM | POA: Diagnosis present

## 2013-01-03 HISTORY — PX: TOTAL HIP ARTHROPLASTY: SHX124

## 2013-01-03 LAB — TYPE AND SCREEN: Antibody Screen: NEGATIVE

## 2013-01-03 LAB — ABO/RH: ABO/RH(D): AB POS

## 2013-01-03 IMAGING — CR DG PORTABLE PELVIS
1 series · 1 of 1 positions shown · non-contrast
Comparison: None.

CLINICAL DATA: Left hip replacement.

PORTABLE PELVIS

[AP]
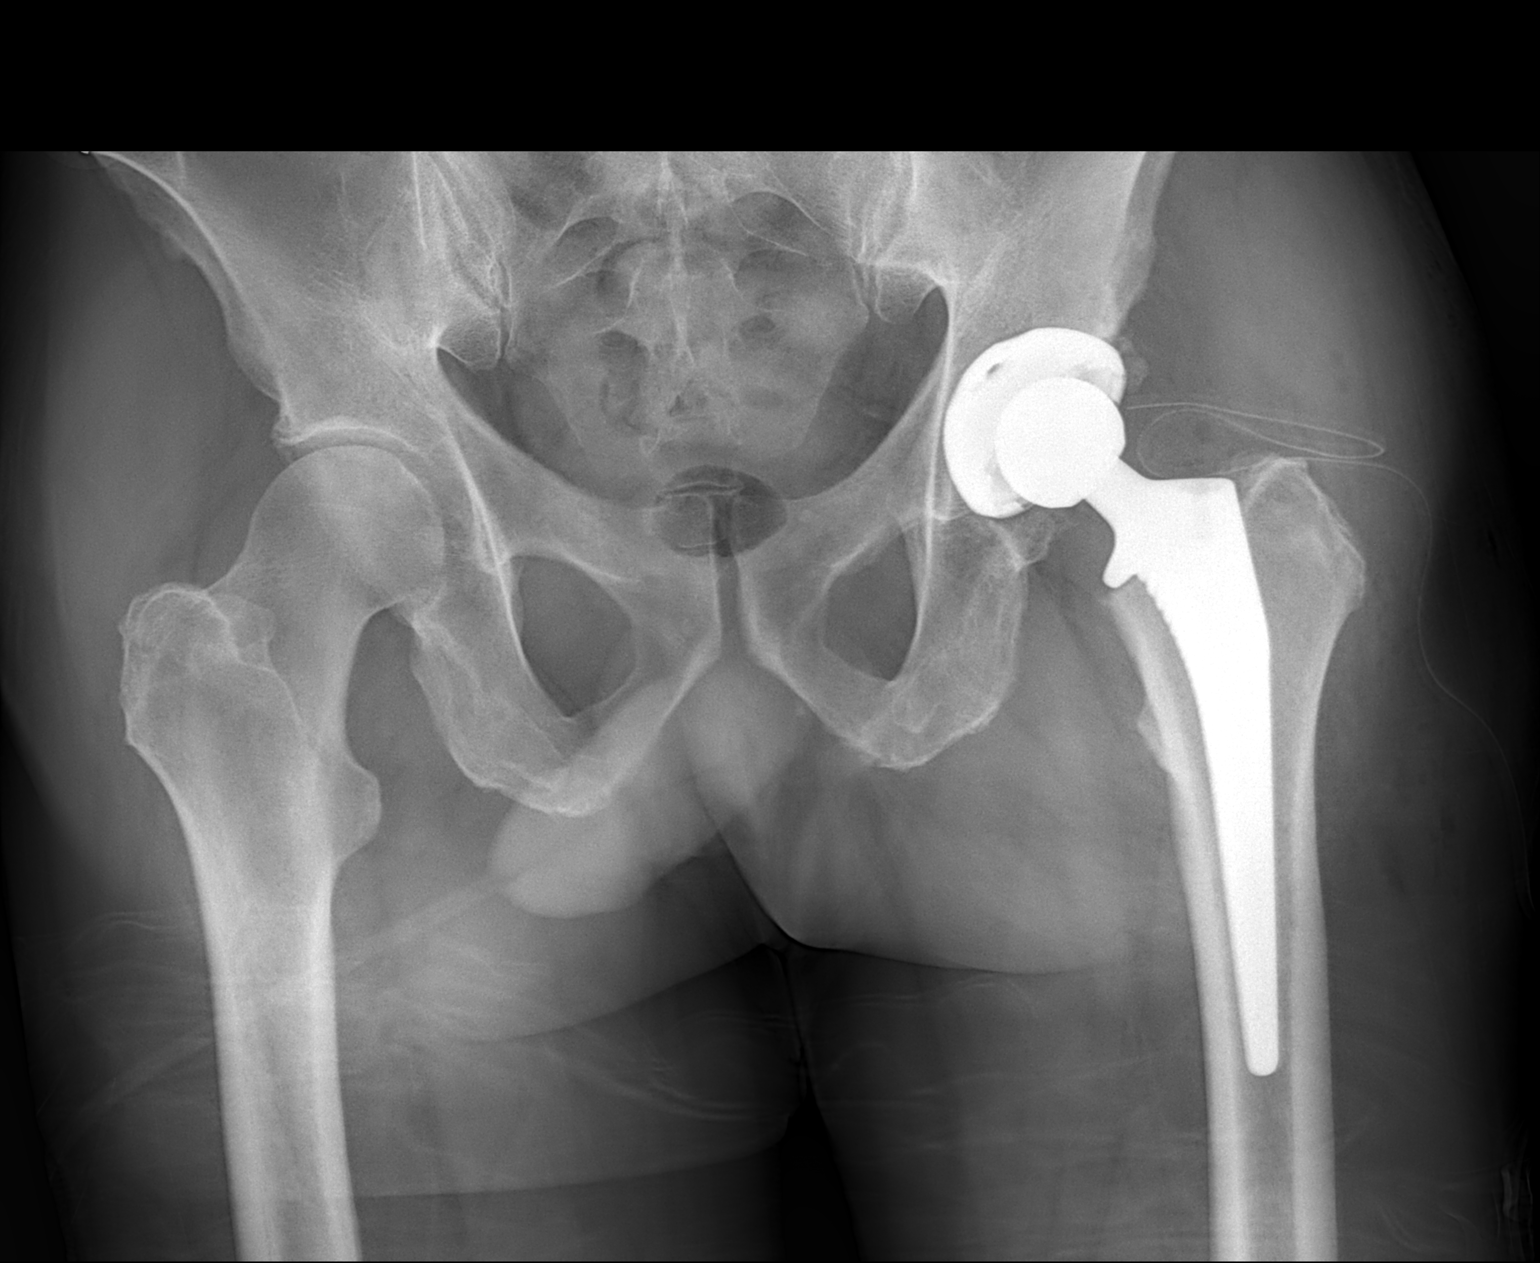

[1 of 1 positions shown; findings below may reference images not displayed]

FINDINGS: Uncomplicated new left total hip arthroplasty.  Surgical
drain in the soft tissues.  The soft tissue swelling present
lateral to the left hip.  Foley catheter noted. The hip appears
located on the single view.  Distal femoral stem visualized and
normal.
IMPRESSION: Uncomplicated new left total hip arthroplasty.

## 2013-01-03 SURGERY — ARTHROPLASTY, HIP, TOTAL, ANTERIOR APPROACH
Anesthesia: General | Site: Hip | Laterality: Left | Wound class: Clean

## 2013-01-03 MED ORDER — FLEET ENEMA 7-19 GM/118ML RE ENEM: 1.0000 | ENEMA | Freq: Once | RECTAL | Status: AC | PRN

## 2013-01-03 MED ORDER — CHLORHEXIDINE GLUCONATE 4 % EX LIQD
60.0000 mL | Freq: Once | CUTANEOUS | Status: DC
Start: 2013-01-03 — End: 2013-01-03

## 2013-01-03 MED ORDER — HYDROMORPHONE HCL PF 1 MG/ML IJ SOLN
INTRAMUSCULAR | Status: DC | PRN
  Administered 2013-01-03 (×4): .5 mg via INTRAVENOUS

## 2013-01-03 MED ORDER — ACETAMINOPHEN 10 MG/ML IV SOLN
INTRAVENOUS | Status: DC | PRN
  Administered 2013-01-03: 1000 mg via INTRAVENOUS

## 2013-01-03 MED ORDER — HYDROMORPHONE HCL PF 1 MG/ML IJ SOLN
INTRAMUSCULAR | Status: AC
  Filled 2013-01-03: qty 1

## 2013-01-03 MED ORDER — ACETAMINOPHEN 325 MG PO TABS: 650.0000 mg | ORAL_TABLET | Freq: Four times a day (QID) | ORAL | Status: DC | PRN

## 2013-01-03 MED ORDER — MORPHINE SULFATE 2 MG/ML IJ SOLN
1.0000 mg | INTRAMUSCULAR | Status: DC | PRN
  Administered 2013-01-03 – 2013-01-04 (×8): 2 mg via INTRAVENOUS
  Filled 2013-01-03 (×8): qty 1

## 2013-01-03 MED ORDER — LACTATED RINGERS IV SOLN: INTRAVENOUS | Status: DC

## 2013-01-03 MED ORDER — TRAMADOL HCL 50 MG PO TABS
50.0000 mg | ORAL_TABLET | Freq: Four times a day (QID) | ORAL | Status: DC | PRN
  Administered 2013-01-04 – 2013-01-05 (×2): 100 mg via ORAL
  Filled 2013-01-03 (×2): qty 2

## 2013-01-03 MED ORDER — ACETAMINOPHEN 10 MG/ML IV SOLN
INTRAVENOUS | Status: AC
  Filled 2013-01-03: qty 100

## 2013-01-03 MED ORDER — DEXAMETHASONE 6 MG PO TABS
10.0000 mg | ORAL_TABLET | Freq: Every day | ORAL | Status: AC
  Administered 2013-01-04: 10 mg via ORAL
  Filled 2013-01-03: qty 1

## 2013-01-03 MED ORDER — OXYCODONE HCL 5 MG PO TABS
5.0000 mg | ORAL_TABLET | ORAL | Status: DC | PRN
  Administered 2013-01-03: 5 mg via ORAL
  Administered 2013-01-04 (×2): 10 mg via ORAL
  Filled 2013-01-03: qty 1
  Filled 2013-01-03 (×2): qty 2

## 2013-01-03 MED ORDER — GLYCOPYRROLATE 0.2 MG/ML IJ SOLN
INTRAMUSCULAR | Status: DC | PRN
  Administered 2013-01-03: .6 mg via INTRAVENOUS

## 2013-01-03 MED ORDER — CEFAZOLIN SODIUM-DEXTROSE 2-3 GM-% IV SOLR
INTRAVENOUS | Status: AC
  Filled 2013-01-03: qty 50

## 2013-01-03 MED ORDER — METHOCARBAMOL 100 MG/ML IJ SOLN
500.0000 mg | Freq: Four times a day (QID) | INTRAVENOUS | Status: DC | PRN
  Administered 2013-01-03: 500 mg via INTRAVENOUS
  Filled 2013-01-03: qty 5

## 2013-01-03 MED ORDER — METHOCARBAMOL 500 MG PO TABS
500.0000 mg | ORAL_TABLET | Freq: Four times a day (QID) | ORAL | Status: DC | PRN
  Administered 2013-01-04 – 2013-01-05 (×4): 500 mg via ORAL
  Filled 2013-01-03 (×4): qty 1

## 2013-01-03 MED ORDER — BUPIVACAINE LIPOSOME 1.3 % IJ SUSP
20.0000 mL | Freq: Once | INTRAMUSCULAR | Status: DC
  Filled 2013-01-03: qty 20

## 2013-01-03 MED ORDER — PROPOFOL 10 MG/ML IV BOLUS
INTRAVENOUS | Status: DC | PRN
  Administered 2013-01-03: 150 mg via INTRAVENOUS

## 2013-01-03 MED ORDER — TRANEXAMIC ACID 100 MG/ML IV SOLN
1000.0000 mg | INTRAVENOUS | Status: AC
  Administered 2013-01-03: 1000 mg via INTRAVENOUS
  Filled 2013-01-03: qty 10

## 2013-01-03 MED ORDER — SODIUM CHLORIDE 0.9 % IJ SOLN
INTRAMUSCULAR | Status: DC | PRN
  Administered 2013-01-03: 18:00:00

## 2013-01-03 MED ORDER — BUPIVACAINE HCL (PF) 0.25 % IJ SOLN
INTRAMUSCULAR | Status: DC | PRN
  Administered 2013-01-03: 20 mL

## 2013-01-03 MED ORDER — METOCLOPRAMIDE HCL 5 MG/ML IJ SOLN: 5.0000 mg | Freq: Three times a day (TID) | INTRAMUSCULAR | Status: DC | PRN

## 2013-01-03 MED ORDER — LABETALOL HCL 5 MG/ML IV SOLN
INTRAVENOUS | Status: DC | PRN
  Administered 2013-01-03: 10 mg via INTRAVENOUS

## 2013-01-03 MED ORDER — PANTOPRAZOLE SODIUM 40 MG PO TBEC
80.0000 mg | DELAYED_RELEASE_TABLET | Freq: Every day | ORAL | Status: DC
  Filled 2013-01-03: qty 2

## 2013-01-03 MED ORDER — DEXAMETHASONE SODIUM PHOSPHATE 10 MG/ML IJ SOLN
INTRAMUSCULAR | Status: DC | PRN
  Administered 2013-01-03: 10 mg via INTRAVENOUS

## 2013-01-03 MED ORDER — HYDROMORPHONE HCL PF 1 MG/ML IJ SOLN
0.2500 mg | INTRAMUSCULAR | Status: DC | PRN
  Administered 2013-01-03 (×2): 0.5 mg via INTRAVENOUS
  Administered 2013-01-03 (×2): 0.25 mg via INTRAVENOUS
  Administered 2013-01-03 (×2): 0.5 mg via INTRAVENOUS
  Administered 2013-01-03: 0.25 mg via INTRAVENOUS

## 2013-01-03 MED ORDER — ONDANSETRON HCL 4 MG/2ML IJ SOLN: 4.0000 mg | Freq: Four times a day (QID) | INTRAMUSCULAR | Status: DC | PRN

## 2013-01-03 MED ORDER — BUPIVACAINE HCL (PF) 0.25 % IJ SOLN
INTRAMUSCULAR | Status: AC
  Filled 2013-01-03: qty 30

## 2013-01-03 MED ORDER — BISACODYL 10 MG RE SUPP: 10.0000 mg | Freq: Every day | RECTAL | Status: DC | PRN

## 2013-01-03 MED ORDER — RIVAROXABAN 10 MG PO TABS
10.0000 mg | ORAL_TABLET | Freq: Every day | ORAL | Status: DC
  Administered 2013-01-04 – 2013-01-05 (×2): 10 mg via ORAL
  Filled 2013-01-03 (×3): qty 1

## 2013-01-03 MED ORDER — DEXTROSE-NACL 5-0.45 % IV SOLN
INTRAVENOUS | Status: DC
  Administered 2013-01-03: 1000 mL via INTRAVENOUS
  Administered 2013-01-04: 06:00:00 via INTRAVENOUS

## 2013-01-03 MED ORDER — ACETAMINOPHEN 650 MG RE SUPP: 650.0000 mg | Freq: Four times a day (QID) | RECTAL | Status: DC | PRN

## 2013-01-03 MED ORDER — MIDAZOLAM HCL 5 MG/5ML IJ SOLN
INTRAMUSCULAR | Status: DC | PRN
  Administered 2013-01-03: 1 mg via INTRAVENOUS

## 2013-01-03 MED ORDER — DIVALPROEX SODIUM ER 500 MG PO TB24
1000.0000 mg | ORAL_TABLET | Freq: Every day | ORAL | Status: DC
  Administered 2013-01-03 – 2013-01-04 (×2): 1000 mg via ORAL
  Filled 2013-01-03 (×3): qty 2

## 2013-01-03 MED ORDER — IPRATROPIUM BROMIDE 0.03 % NA SOLN: 2.0000 | Freq: Two times a day (BID) | NASAL | Status: DC | PRN

## 2013-01-03 MED ORDER — MONTELUKAST SODIUM 10 MG PO TABS
10.0000 mg | ORAL_TABLET | Freq: Every day | ORAL | Status: DC | PRN
  Filled 2013-01-03: qty 1

## 2013-01-03 MED ORDER — 0.9 % SODIUM CHLORIDE (POUR BTL) OPTIME
TOPICAL | Status: DC | PRN
  Administered 2013-01-03: 1000 mL

## 2013-01-03 MED ORDER — CEFAZOLIN SODIUM 1-5 GM-% IV SOLN
1.0000 g | Freq: Four times a day (QID) | INTRAVENOUS | Status: AC
  Administered 2013-01-03 – 2013-01-04 (×2): 1 g via INTRAVENOUS
  Filled 2013-01-03 (×2): qty 50

## 2013-01-03 MED ORDER — METOCLOPRAMIDE HCL 10 MG PO TABS: 5.0000 mg | ORAL_TABLET | Freq: Three times a day (TID) | ORAL | Status: DC | PRN

## 2013-01-03 MED ORDER — SUCCINYLCHOLINE CHLORIDE 20 MG/ML IJ SOLN
INTRAMUSCULAR | Status: DC | PRN
  Administered 2013-01-03: 100 mg via INTRAVENOUS

## 2013-01-03 MED ORDER — ACETAMINOPHEN 10 MG/ML IV SOLN
1000.0000 mg | Freq: Four times a day (QID) | INTRAVENOUS | Status: AC
  Administered 2013-01-04 (×4): 1000 mg via INTRAVENOUS
  Filled 2013-01-03 (×6): qty 100

## 2013-01-03 MED ORDER — ONDANSETRON HCL 4 MG/2ML IJ SOLN
INTRAMUSCULAR | Status: DC | PRN
  Administered 2013-01-03: 4 mg via INTRAVENOUS

## 2013-01-03 MED ORDER — LACTATED RINGERS IV SOLN
INTRAVENOUS | Status: DC
  Administered 2013-01-03: 17:00:00 via INTRAVENOUS
  Administered 2013-01-03: 1000 mL via INTRAVENOUS

## 2013-01-03 MED ORDER — ONDANSETRON HCL 4 MG PO TABS: 4.0000 mg | ORAL_TABLET | Freq: Four times a day (QID) | ORAL | Status: DC | PRN

## 2013-01-03 MED ORDER — ROCURONIUM BROMIDE 100 MG/10ML IV SOLN
INTRAVENOUS | Status: DC | PRN
  Administered 2013-01-03: 40 mg via INTRAVENOUS
  Administered 2013-01-03: 10 mg via INTRAVENOUS

## 2013-01-03 MED ORDER — PROMETHAZINE HCL 25 MG/ML IJ SOLN: 6.2500 mg | INTRAMUSCULAR | Status: DC | PRN

## 2013-01-03 MED ORDER — SODIUM CHLORIDE 0.9 % IV SOLN
INTRAVENOUS | Status: DC
Start: 2013-01-03 — End: 2013-01-03

## 2013-01-03 MED ORDER — POLYETHYLENE GLYCOL 3350 17 G PO PACK: 17.0000 g | PACK | Freq: Every day | ORAL | Status: DC | PRN

## 2013-01-03 MED ORDER — DOCUSATE SODIUM 100 MG PO CAPS
100.0000 mg | ORAL_CAPSULE | Freq: Two times a day (BID) | ORAL | Status: DC
  Administered 2013-01-03 – 2013-01-05 (×4): 100 mg via ORAL

## 2013-01-03 MED ORDER — LEVALBUTEROL TARTRATE 45 MCG/ACT IN AERO: 1.0000 | INHALATION_SPRAY | RESPIRATORY_TRACT | Status: DC | PRN

## 2013-01-03 MED ORDER — CYCLOSPORINE 0.05 % OP EMUL
1.0000 [drp] | Freq: Two times a day (BID) | OPHTHALMIC | Status: DC
  Administered 2013-01-03 – 2013-01-04 (×2): 1 [drp] via OPHTHALMIC
  Administered 2013-01-04: 09:00:00 via OPHTHALMIC
  Administered 2013-01-05: 1 [drp] via OPHTHALMIC
  Filled 2013-01-03 (×5): qty 1

## 2013-01-03 MED ORDER — MENTHOL 3 MG MT LOZG
1.0000 | LOZENGE | OROMUCOSAL | Status: DC | PRN
  Filled 2013-01-03: qty 9

## 2013-01-03 MED ORDER — CEFAZOLIN SODIUM-DEXTROSE 2-3 GM-% IV SOLR
2.0000 g | INTRAVENOUS | Status: AC
  Administered 2013-01-03: 2 g via INTRAVENOUS

## 2013-01-03 MED ORDER — ACETAMINOPHEN 10 MG/ML IV SOLN: 1000.0000 mg | Freq: Once | INTRAVENOUS | Status: DC

## 2013-01-03 MED ORDER — FENTANYL CITRATE 0.05 MG/ML IJ SOLN
INTRAMUSCULAR | Status: DC | PRN
  Administered 2013-01-03 (×3): 50 ug via INTRAVENOUS
  Administered 2013-01-03: 100 ug via INTRAVENOUS

## 2013-01-03 MED ORDER — NEOSTIGMINE METHYLSULFATE 1 MG/ML IJ SOLN
INTRAMUSCULAR | Status: DC | PRN
  Administered 2013-01-03: 4 mg via INTRAVENOUS

## 2013-01-03 MED ORDER — LIDOCAINE HCL (CARDIAC) 20 MG/ML IV SOLN
INTRAVENOUS | Status: DC | PRN
  Administered 2013-01-03: 100 mg via INTRAVENOUS

## 2013-01-03 MED ORDER — DEXAMETHASONE SODIUM PHOSPHATE 10 MG/ML IJ SOLN: 10.0000 mg | Freq: Every day | INTRAMUSCULAR | Status: AC

## 2013-01-03 MED ORDER — DIPHENHYDRAMINE HCL 12.5 MG/5ML PO ELIX: 12.5000 mg | ORAL_SOLUTION | ORAL | Status: DC | PRN

## 2013-01-03 MED ORDER — PHENOL 1.4 % MT LIQD: 1.0000 | OROMUCOSAL | Status: DC | PRN

## 2013-01-03 SURGICAL SUPPLY — 42 items
BAG SPEC THK2 15X12 ZIP CLS (MISCELLANEOUS) ×2
BAG ZIPLOCK 12X15 (MISCELLANEOUS) ×4 IMPLANT
BLADE SAW SGTL 18X1.27X75 (BLADE) ×2 IMPLANT
CLOTH BEACON ORANGE TIMEOUT ST (SAFETY) ×2 IMPLANT
CLSR STERI-STRIP ANTIMIC 1/2X4 (GAUZE/BANDAGES/DRESSINGS) ×4 IMPLANT
DECANTER SPIKE VIAL GLASS SM (MISCELLANEOUS) ×2 IMPLANT
DRAPE C-ARM 42X72 X-RAY (DRAPES) ×2 IMPLANT
DRAPE STERI IOBAN 125X83 (DRAPES) ×2 IMPLANT
DRAPE U-SHAPE 47X51 STRL (DRAPES) ×6 IMPLANT
DRSG ADAPTIC 3X8 NADH LF (GAUZE/BANDAGES/DRESSINGS) ×2 IMPLANT
DRSG EMULSION OIL 3X16 NADH (GAUZE/BANDAGES/DRESSINGS) ×1 IMPLANT
DRSG MEPILEX BORDER 4X4 (GAUZE/BANDAGES/DRESSINGS) ×4 IMPLANT
DRSG MEPILEX BORDER 4X8 (GAUZE/BANDAGES/DRESSINGS) ×2 IMPLANT
DURAPREP 26ML APPLICATOR (WOUND CARE) ×2 IMPLANT
ELECT BLADE 6.5 EXT (BLADE) ×2 IMPLANT
ELECT REM PT RETURN 9FT ADLT (ELECTROSURGICAL) ×2
ELECTRODE REM PT RTRN 9FT ADLT (ELECTROSURGICAL) ×1 IMPLANT
EVACUATOR 1/8 PVC DRAIN (DRAIN) ×1 IMPLANT
FACESHIELD LNG OPTICON STERILE (SAFETY) ×8 IMPLANT
GLOVE BIO SURGEON STRL SZ7.5 (GLOVE) ×2 IMPLANT
GLOVE BIO SURGEON STRL SZ8 (GLOVE) ×4 IMPLANT
GLOVE BIOGEL PI IND STRL 8 (GLOVE) ×2 IMPLANT
GLOVE BIOGEL PI INDICATOR 8 (GLOVE) ×2
GOWN STRL NON-REIN LRG LVL3 (GOWN DISPOSABLE) ×2 IMPLANT
GOWN STRL REIN XL XLG (GOWN DISPOSABLE) ×2 IMPLANT
KIT BASIN OR (CUSTOM PROCEDURE TRAY) ×2 IMPLANT
NDL SAFETY ECLIPSE 18X1.5 (NEEDLE) ×1 IMPLANT
NEEDLE HYPO 18GX1.5 SHARP (NEEDLE) ×2
PACK TOTAL JOINT (CUSTOM PROCEDURE TRAY) ×2 IMPLANT
PADDING CAST COTTON 6X4 STRL (CAST SUPPLIES) ×2 IMPLANT
SPONGE GAUZE 4X4 12PLY (GAUZE/BANDAGES/DRESSINGS) ×2 IMPLANT
SUCTION FRAZIER 12FR DISP (SUCTIONS) ×2 IMPLANT
SUT ETHIBOND NAB CT1 #1 30IN (SUTURE) ×6 IMPLANT
SUT MNCRL AB 4-0 PS2 18 (SUTURE) ×2 IMPLANT
SUT VIC AB 1 CT1 27 (SUTURE) ×2
SUT VIC AB 1 CT1 27XBRD ANTBC (SUTURE) ×1 IMPLANT
SUT VIC AB 2-0 CT1 27 (SUTURE) ×4
SUT VIC AB 2-0 CT1 TAPERPNT 27 (SUTURE) ×2 IMPLANT
SUT VLOC 180 0 24IN GS25 (SUTURE) ×2 IMPLANT
SYR 50ML LL SCALE MARK (SYRINGE) ×2 IMPLANT
TOWEL OR 17X26 10 PK STRL BLUE (TOWEL DISPOSABLE) ×4 IMPLANT
TRAY FOLEY CATH 14FRSI W/METER (CATHETERS) ×2 IMPLANT

## 2013-01-03 NOTE — Op Note (Signed)
OPERATIVE REPORT  PREOPERATIVE DIAGNOSIS: Osteoarthritis of the Left hip.   POSTOPERATIVE DIAGNOSIS: Osteoarthritis of the Left  hip.   PROCEDURE: Left total hip arthroplasty, anterior approach.   SURGEON: Ollen Gross, MD   ASSISTANT: Avel Peace, PA-C  ANESTHESIA:  General  ESTIMATED BLOOD LOSS:-  DRAINS: Hemovac x1.   COMPLICATIONS: None   CONDITION: PACU - hemodynamically stable.   BRIEF CLINICAL NOTE: Philip Gates is a 57 y.o. male who has advanced end-  stage arthritis of his Left  hip with progressively worsening pain and  dysfunction.The patient has failed nonoperative management and presents for  total hip arthroplasty.   PROCEDURE IN DETAIL: After successful administration of spinal  anesthetic, the traction boots for the [facility] bed were placed on both  feet and the patient was placed onto the [facility] bed, boots placed into the leg  holders. The Left hip was then isolated from the perineum with plastic  drapes and prepped and draped in the usual sterile fashion. ASIS and  greater trochanter were marked and a oblique incision was made, starting  at about 1 cm lateral and 2 cm distal to the ASIS and coursing towards  the anterior cortex of the femur. The skin was cut with a 10 blade  through subcutaneous tissue to the level of the fascia overlying the  tensor fascia lata muscle. The fascia was then incised in line with the  incision at the junction of the anterior third and posterior 2/3rd. The  muscle was teased off the fascia and then the interval between the TFL  and the rectus was developed. The Hohmann retractor was then placed at  the top of the femoral neck over the capsule. The vessels overlying the  capsule were cauterized and the fat on top of the capsule was removed.  A Hohmann retractor was then placed anterior underneath the rectus  femoris to give exposure to the entire anterior capsule. A T-shaped  capsulotomy was performed. The  edges were tagged and the femoral head  was identified.       Osteophytes are removed off the superior acetabulum.  The femoral neck was then cut in situ with an oscillating saw. Traction  was then applied to the left lower extremity utilizing the [facility]  traction. The femoral head was then removed. Retractors were placed  around the acetabulum and then circumferential removal of the labrum was  performed. Osteophytes were also removed. Reaming starts at 47 mm to  medialize and  Increased in 2 mm increments to 51 mm. We reamed in  approximately 40 degrees of abduction, 20 degrees anteversion. A 52 mm  pinnacle acetabular shell was then impacted in anatomic position under  fluoroscopic guidance with excellent purchase. We did not need to place  any additional dome screws. A 32 mm neutral + 4 marathon liner was then  placed into the acetabular shell.       The femoral lift was then placed along the lateral aspect of the femur  just distal to the vastus ridge. The leg was  externally rotated and capsule  was stripped off the inferior aspect of the femoral neck down to the  level of the lesser trochanter, this was done with electrocautery. The femur was lifted after this was performed. The  leg was then placed and extended in adducted position to essentially delivering the femur. We also removed the capsule superiorly and the  piriformis from the piriformis fossa  to gain excellent exposure of the  proximal femur. Rongeur was used to remove some cancellous bone to get  into the lateral portion of the proximal femur for placement of the  initial starter reamer. The starter broaches was placed  the starter broach  and was shown to go down the center of the canal. Broaching  with the  Corail system was then performed starting at size 8, coursing  Up to size 12. A size 12 had excellent torsional and rotational  and axial stability. The trial standard offset neck was then placed  with a 32 + 1 trial  head. The hip was then reduced. We confirmed that  the stem was in the canal both on AP and lateral x-rays. It also has excellent sizing. The hip was reduced with outstanding stability through full extension, full external rotation,  and then flexion in adduction internal rotation. AP pelvis was taken  and the leg lengths were measured and found to be exactly equal. Hip  was then dislocated again and the femoral head and neck removed. The  femoral broach was removed. Size 12 Corail stem with a standard offset  neck was then impacted into the femur following native anteversion. Has  excellent purchase in the canal. Excellent torsional and rotational and  axial stability. It is confirmed to be in the canal on AP and lateral  fluoroscopic views. The 32 + 1 ceramic head was placed and the hip  reduced with outstanding stability. Again AP pelvis was taken and it  confirmed that the leg lengths were equal. The wound was then copiously  irrigated with saline solution and the capsule reattached and repaired  with Ethibond suture.  20 mL of Exparel mixed with 50 mL of saline injected  into the capsule and into the edge of the tensor fascia lata as well as  subcutaneous tissue. The fascia overlying the tensor fascia lata was  then closed with a running #1 V-Loc. Subcu was closed with interrupted  2-0 Vicryl and subcuticular running 4-0 Monocryl. Incision was cleaned  and dried. Steri-Strips and a bulky sterile dressing applied. Hemovac  drain was hooked to suction and then he was awakened and transported to  recovery in stable condition.        Please note that a surgical assistant was a medical necessity for this procedure to perform it in a safe and expeditious manner. Assistant was necessary to provide appropriate retraction of vital neurovascular structures and to prevent femoral fracture and allow for anatomic placement of the prosthesis.  Ollen Gross, M.D.

## 2013-01-03 NOTE — Transfer of Care (Signed)
Immediate Anesthesia Transfer of Care Note  Patient: Philip Gates  Procedure(s) Performed: Procedure(s): TOTAL HIP ARTHROPLASTY ANTERIOR APPROACH (Left)  Patient Location: PACU  Anesthesia Type:General  Level of Consciousness: sedated  Airway & Oxygen Therapy: Patient Spontanous Breathing and Patient connected to face mask oxygen  Post-op Assessment: Report given to PACU RN and Post -op Vital signs reviewed and stable  Post vital signs: Reviewed and stable  Complications: No apparent anesthesia complications

## 2013-01-03 NOTE — Interval H&P Note (Signed)
History and Physical Interval Note:  01/03/2013 3:45 PM  Philip Gates  has presented today for surgery, with the diagnosis of OSTEOARTHRITIS LEFT HIP  The various methods of treatment have been discussed with the patient and family. After consideration of risks, benefits and other options for treatment, the patient has consented to  Procedure(s): TOTAL HIP ARTHROPLASTY ANTERIOR APPROACH (Left) as a surgical intervention .  The patient's history has been reviewed, patient examined, no change in status, stable for surgery.  I have reviewed the patient's chart and labs.  Questions were answered to the patient's satisfaction.     Loanne Drilling

## 2013-01-03 NOTE — Anesthesia Preprocedure Evaluation (Signed)
Anesthesia Evaluation  Patient identified by MRN, date of birth, ID band Patient awake    Reviewed: Allergy & Precautions, H&P , NPO status , Patient's Chart, lab work & pertinent test results  Airway Mallampati: II TM Distance: >3 FB Neck ROM: Full    Dental  (+) Teeth Intact and Caps,    Pulmonary asthma , former smoker,  breath sounds clear to auscultation        Cardiovascular negative cardio ROS  Rhythm:Regular Rate:Normal     Neuro/Psych Bipolar Disorder negative neurological ROS     GI/Hepatic Neg liver ROS, GERD-  ,  Endo/Other  negative endocrine ROS  Renal/GU negative Renal ROS  negative genitourinary   Musculoskeletal negative musculoskeletal ROS (+)   Abdominal   Peds negative pediatric ROS (+)  Hematology negative hematology ROS (+)   Anesthesia Other Findings   Reproductive/Obstetrics negative OB ROS                           Anesthesia Physical Anesthesia Plan  ASA: II  Anesthesia Plan: General   Post-op Pain Management:    Induction: Intravenous  Airway Management Planned: Oral ETT  Additional Equipment:   Intra-op Plan:   Post-operative Plan: Extubation in OR  Informed Consent: I have reviewed the patients History and Physical, chart, labs and discussed the procedure including the risks, benefits and alternatives for the proposed anesthesia with the patient or authorized representative who has indicated his/her understanding and acceptance.   Dental advisory given  Plan Discussed with: CRNA  Anesthesia Plan Comments:         Anesthesia Quick Evaluation

## 2013-01-03 NOTE — Anesthesia Postprocedure Evaluation (Signed)
Anesthesia Post Note  Patient: Philip Gates  Procedure(s) Performed: Procedure(s) (LRB): TOTAL HIP ARTHROPLASTY ANTERIOR APPROACH (Left)  Anesthesia type: General  Patient location: PACU  Post pain: Pain level controlled  Post assessment: Post-op Vital signs reviewed  Last Vitals:  Filed Vitals:   01/03/13 1930  BP: 151/84  Pulse: 91  Temp:   Resp: 16    Post vital signs: Reviewed  Level of consciousness: sedated  Complications: No apparent anesthesia complications

## 2013-01-03 NOTE — Preoperative (Signed)
Beta Blockers   Reason not to administer Beta Blockers:Not Applicable 

## 2013-01-04 LAB — BASIC METABOLIC PANEL
BUN: 9 mg/dL (ref 6–23)
CO2: 32 mEq/L (ref 19–32)
Chloride: 99 mEq/L (ref 96–112)
GFR calc Af Amer: >90 mL/min (ref >90)
Potassium: 3.8 mEq/L (ref 3.5–5.1)

## 2013-01-04 LAB — CBC
HCT: 40.1 % (ref 39.0–52.0)
Hemoglobin: 13.6 g/dL (ref 13.0–17.0)
MCV: 90.7 fL (ref 78.0–100.0)
RBC: 4.42 MIL/uL (ref 4.22–5.81)
RDW: 13.6 % (ref 11.5–15.5)
WBC: 9.1 10*3/uL (ref 4.0–10.5)

## 2013-01-04 MED ORDER — DEXLANSOPRAZOLE 60 MG PO CPDR
60.0000 mg | DELAYED_RELEASE_CAPSULE | Freq: Every day | ORAL | Status: DC
  Administered 2013-01-04 – 2013-01-05 (×2): 60 mg via ORAL
  Filled 2013-01-04 (×3): qty 1

## 2013-01-04 MED ORDER — OXYCODONE HCL 5 MG PO TABS
5.0000 mg | ORAL_TABLET | ORAL | Status: DC | PRN
  Administered 2013-01-04 – 2013-01-05 (×9): 20 mg via ORAL
  Filled 2013-01-04 (×9): qty 4

## 2013-01-04 MED ORDER — RIVAROXABAN 10 MG PO TABS: 10.0000 mg | ORAL_TABLET | Freq: Every day | ORAL | Status: DC

## 2013-01-04 MED ORDER — LAMOTRIGINE 200 MG PO TABS
200.0000 mg | ORAL_TABLET | Freq: Every day | ORAL | Status: DC
  Administered 2013-01-04: 200 mg via ORAL
  Filled 2013-01-04 (×2): qty 1

## 2013-01-04 MED ORDER — TRAMADOL HCL 50 MG PO TABS: 50.0000 mg | ORAL_TABLET | Freq: Four times a day (QID) | ORAL | Status: DC | PRN

## 2013-01-04 MED ORDER — OXYCODONE HCL 5 MG PO TABS: 5.0000 mg | ORAL_TABLET | ORAL | Status: DC | PRN

## 2013-01-04 MED ORDER — TAMSULOSIN HCL 0.4 MG PO CAPS
0.4000 mg | ORAL_CAPSULE | Freq: Every day | ORAL | Status: DC
  Administered 2013-01-04 – 2013-01-05 (×2): 0.4 mg via ORAL
  Filled 2013-01-04 (×2): qty 1

## 2013-01-04 MED ORDER — NON FORMULARY: 60.0000 mg | Freq: Every day | Status: DC

## 2013-01-04 MED ORDER — METHOCARBAMOL 500 MG PO TABS: 500.0000 mg | ORAL_TABLET | Freq: Four times a day (QID) | ORAL | Status: DC | PRN

## 2013-01-04 NOTE — Care Management Note (Signed)
    Page 1 of 2   01/04/2013     3:53:45 PM   CARE MANAGEMENT NOTE 01/04/2013  Patient:  Philip Gates, Philip Gates   Account Number:  [id number]  Date Initiated:  01/04/2013  Documentation initiated by:  Lorenda Ishihara  Subjective/Objective Assessment:   57 yo male admitted s/p THA. PTA lived at home with spouse.     Action/Plan:   Home when stable   Anticipated DC Date:  01/05/2013   Anticipated DC Plan:  HOME W HOME HEALTH SERVICES      DC Planning Services  CM consult      [facility] Choice  HOME HEALTH  DURABLE MEDICAL EQUIPMENT   Choice offered to / List presented to:  C-3 Spouse   DME arranged  Levan Hurst      DME agency  [facility]     HH arranged  HH-2 PT      [facility] agency  [facility]   Status of service:  Completed, signed off Medicare Important Message given?   (If response is "NO", the following Medicare IM given date fields will be blank) Date Medicare IM given:   Date Additional Medicare IM given:    Discharge Disposition:  HOME W HOME HEALTH SERVICES  Per UR Regulation:  Reviewed for med. necessity/level of care/duration of stay  If discussed at Long Length of Stay Meetings, dates discussed:    Comments:  01-04-13 Lorenda Ishihara RN CM 1300 Spoke with patient and spouse at bedside. Spouse indicated that [facility] had been arranged with the office and there was nothing for me to arrange regarding [facility] services. Spouse was unable to recall name of agency initially. Spouse later remembered that [facility] had been their choice. Contacted [facility] and they were called by the office but no official orders or documents had been sent to them. They are able to provide the services. Informed them that I was awaiting final orders but d/c was planned for tomorrow and as soon as orders were written I would fax to them.

## 2013-01-04 NOTE — Progress Notes (Signed)
Physical Therapy Treatment Patient Details Name: Philip Gates MRN: [id number] DOB: 02/13/1956 Today's Date: 01/04/2013 Time: 4098-1191 PT Time Calculation (min): 43 min  PT Assessment / Plan / Recommendation Comments on Treatment Session  Pt progressing very well with mobility and exercises.  Anticipate he will be ready for D/C tomorrow.     Follow Up Recommendations  Home health PT     Does the patient have the potential to tolerate intense rehabilitation     Barriers to Discharge        Equipment Recommendations  Rolling walker with 5" wheels    Recommendations for Other Services    Frequency 7X/week   Plan Discharge plan remains appropriate    Precautions / Restrictions Precautions Precautions: None Restrictions Weight Bearing Restrictions: No Other Position/Activity Restrictions: WBAT   Pertinent Vitals/Pain 2/10 pain    Mobility  Bed Mobility Bed Mobility: Supine to Sit Supine to Sit: 4: Min guard;4: Min assist;HOB flat Details for Bed Mobility Assistance: Assist for LLE out of bed with cues for hand placement and technique.  Transfers Transfers: Sit to Stand;Stand to Sit Sit to Stand: 4: Min guard;From bed Stand to Sit: 4: Min guard;With upper extremity assist;With armrests;To chair/3-in-1 Details for Transfer Assistance: Min cues for hand placement.  Ambulation/Gait Ambulation/Gait Assistance: 5: Supervision;4: Min guard Ambulation Distance (Feet): 120 Feet Assistive device: Rolling walker Ambulation/Gait Assistance Details: Min cues for step to vs step through gait pattern.  Gait Pattern: Step-to pattern;Decreased stride length;Antalgic;Trunk flexed Gait velocity: decreased    Exercises Total Joint Exercises Ankle Circles/Pumps: AROM;Both;20 reps Quad Sets: AROM;Left;10 reps Heel Slides: AAROM;Left;10 reps Hip ABduction/ADduction: AAROM;Left;10 reps   PT Diagnosis:    PT Problem List:   PT Treatment Interventions:     PT Goals Acute Rehab PT  Goals PT Goal Formulation: With patient Time For Goal Achievement: 01/07/13 Potential to Achieve Goals: Good Pt will go Supine/Side to Sit: with supervision PT Goal: Supine/Side to Sit - Progress: Progressing toward goal Pt will go Sit to Stand: with supervision PT Goal: Sit to Stand - Progress: Progressing toward goal Pt will Ambulate: 51 - 150 feet;with supervision;with least restrictive assistive device PT Goal: Ambulate - Progress: Progressing toward goal Pt will Perform Home Exercise Program: with supervision, verbal cues required/provided PT Goal: Perform Home Exercise Program - Progress: Progressing toward goal  Visit Information  Last PT Received On: 01/04/13 Assistance Needed: +1    Subjective Data  Subjective: Do you think I"m doing good? Patient Stated Goal: to go home tomorrow.    Cognition  Cognition Arousal/Alertness: Awake/alert Behavior During Therapy: WFL for tasks assessed/performed Overall Cognitive Status: Within Functional Limits for tasks assessed    Balance     End of Session PT - End of Session Activity Tolerance: Patient tolerated treatment well Patient left: in chair;with call bell/phone within reach;with family/visitor present Nurse Communication: Mobility status   GP     Vista Deck 01/04/2013, 3:32 PM

## 2013-01-04 NOTE — Progress Notes (Signed)
   Subjective: 1 Day Post-Op Procedure(s) (LRB): TOTAL HIP ARTHROPLASTY ANTERIOR APPROACH (Left) Patient reports pain as moderate and severe pain last night. Patient seen in rounds with Dr. Lequita Halt.  Wife in room at bedside. Patient is having problems with pain in the hip and thigh, requiring pain medications We will start therapy today.  Plan is to go Home after hospital stay.  Objective: Vital signs in last 24 hours: Temp:  [97.7 F (36.5 C)-99.5 F (37.5 C)] 98.3 F (36.8 C) (04/29 0555) Pulse Rate:  [79-106] 101 (04/29 0555) Resp:  [12-19] 16 (04/29 0555) BP: (139-187)/(76-109) 156/89 mmHg (04/29 0555) SpO2:  [92 %-100 %] 97 % (04/29 0555) Weight:  [86.6 kg (190 lb 14.7 oz)] 86.6 kg (190 lb 14.7 oz) (04/29 0200)  Intake/Output from previous day:  Intake/Output Summary (Last 24 hours) at 01/04/13 0803 Last data filed at 01/04/13 0600  Gross per 24 hour  Intake 2968.33 ml  Output   4665 ml  Net -1696.67 ml    Intake/Output this shift:    Labs:  Recent Labs  01/04/13 0416  HGB 13.6    Recent Labs  01/04/13 0416  WBC 9.1  RBC 4.42  HCT 40.1  PLT 237    Recent Labs  01/04/13 0416  NA 139  K 3.8  CL 99  CO2 32  BUN 9  CREATININE 0.98  GLUCOSE 93  CALCIUM 9.2   No results found for this basename: LABPT, INR,  in the last 72 hours  EXAM General - Patient is Alert, Appropriate and Oriented Extremity - Neurovascular intact Sensation intact distally Dorsiflexion/Plantar flexion intact Dressing - dressing C/D/I Motor Function - intact, moving foot and toes well on exam.  Hemovac pulled without difficulty.  Past Medical History  Diagnosis Date  . Asthma     ACTIVITY INDUCED  . Sinus drainage   . Arthritis   . GERD (gastroesophageal reflux disease)   . Bipolar 1 disorder   . Neck pain, chronic     Assessment/Plan: 1 Day Post-Op Procedure(s) (LRB): TOTAL HIP ARTHROPLASTY ANTERIOR APPROACH (Left) Principal Problem:   OA (osteoarthritis) of  hip  Estimated body mass index is 26.64 kg/(m^2) as calculated from the following:   Height as of this encounter: 5\' 11"  (1.803 m).   Weight as of this encounter: 86.6 kg (190 lb 14.7 oz). Advance diet Up with therapy Discharge home with home health  Increase the oxycodone.  Has IV push as a backup for pain.  DVT Prophylaxis - Xarelto Weight Bearing As Tolerated left Hemovac Pulled Begin Therapy No vaccines.  Philip Gates 01/04/2013, 8:03 AM

## 2013-01-04 NOTE — Progress Notes (Signed)
Received order for rw.  Delivered to hospital room.

## 2013-01-04 NOTE — Evaluation (Signed)
Physical Therapy Evaluation Patient Details Name: Philip Gates MRN: [id number] DOB: 04-15-1956 Today's Date: 01/04/2013 Time: 4098-1191 PT Time Calculation (min): 30 min  PT Assessment / Plan / Recommendation Clinical Impression  Pt presents s/p L THA (direct ant) POD 1 with decreased strength, ROM and mobility.  Tolerated OOB and ambulation in hallway very well with RW at min assist.  Pt will benefit from skilled PT in acute venue to address deficits.  PT recommends HHPT for follow up at D/C to maximize pts safety and function.     PT Assessment  Patient needs continued PT services    Follow Up Recommendations  Home health PT    Does the patient have the potential to tolerate intense rehabilitation      Barriers to Discharge None      Equipment Recommendations  Rolling walker with 5" wheels    Recommendations for Other Services OT consult   Frequency 7X/week    Precautions / Restrictions Precautions Precautions: None Restrictions Weight Bearing Restrictions: No Other Position/Activity Restrictions: WBAT   Pertinent Vitals/Pain 5/10 following amb, ice pack applied, RN aware of pts request for pain meds.       Mobility  Bed Mobility Bed Mobility: Supine to Sit Supine to Sit: 4: Min assist;HOB elevated Details for Bed Mobility Assistance: Assist for LLE out of bed with cues for hand placement and technique.  Transfers Transfers: Sit to Stand;Stand to Sit Sit to Stand: 4: Min assist;From elevated surface;With upper extremity assist;From bed Stand to Sit: 4: Min assist;With upper extremity assist;With armrests;To chair/3-in-1 Details for Transfer Assistance: Cues for hand placement and LE management when sitting/standing.  Ambulation/Gait Ambulation/Gait Assistance: 4: Min assist Ambulation Distance (Feet): 83 Feet Assistive device: Rolling walker Ambulation/Gait Assistance Details: Cues for sequencing/technique with RW and to maintain upright posture throughout.   Again, noted very forward head posture, but could correct with cues.   Gait Pattern: Step-to pattern;Decreased stride length;Antalgic;Trunk flexed Gait velocity: decreased Stairs: No Wheelchair Mobility Wheelchair Mobility: No    Exercises     PT Diagnosis: Difficulty walking;Generalized weakness;Acute pain  PT Problem List: Decreased strength;Decreased range of motion;Decreased activity tolerance;Decreased balance;Decreased mobility;Decreased knowledge of use of DME;Pain PT Treatment Interventions: DME instruction;Gait training;Stair training;Functional mobility training;Therapeutic activities;Therapeutic exercise;Balance training;Patient/family education   PT Goals Acute Rehab PT Goals PT Goal Formulation: With patient Time For Goal Achievement: 01/07/13 Potential to Achieve Goals: Good Pt will go Supine/Side to Sit: with supervision PT Goal: Supine/Side to Sit - Progress: Goal set today Pt will go Sit to Supine/Side: with supervision PT Goal: Sit to Supine/Side - Progress: Goal set today Pt will go Sit to Stand: with supervision PT Goal: Sit to Stand - Progress: Goal set today Pt will Ambulate: 51 - 150 feet;with supervision;with least restrictive assistive device PT Goal: Ambulate - Progress: Goal set today Pt will Go Up / Down Stairs: 3-5 stairs;with supervision;with rail(s);with least restrictive assistive device PT Goal: Up/Down Stairs - Progress: Goal set today Pt will Perform Home Exercise Program: with supervision, verbal cues required/provided PT Goal: Perform Home Exercise Program - Progress: Goal set today  Visit Information  Last PT Received On: 01/04/13 Assistance Needed: +1    Subjective Data  Subjective: I'm ready to get up.  Patient Stated Goal: to go home tomorrow.    Prior Functioning  Home Living Lives With: Spouse Available Help at Discharge: Family;Available 24 hours/day Type of Home: House Home Access: Stairs to enter [employer] of  Steps: 4 Entrance Stairs-Rails: Left Home  Layout: One level Bathroom Shower/Tub: Walk-in shower;Door [employer] Toilet: Handicapped height Home Adaptive Equipment: Built-in shower seat;Bedside commode/3-in-1;Straight cane Prior Function Level of Independence: Independent Able to Take Stairs?: Yes Driving: Yes Vocation: Full time employment Communication Communication: No difficulties    Cognition  Cognition Arousal/Alertness: Awake/alert Behavior During Therapy: WFL for tasks assessed/performed Overall Cognitive Status: Within Functional Limits for tasks assessed    Extremity/Trunk Assessment Right Lower Extremity Assessment RLE ROM/Strength/Tone: WFL for tasks assessed RLE Sensation: WFL - Light Touch Left Lower Extremity Assessment LLE ROM/Strength/Tone: Deficits LLE ROM/Strength/Tone Deficits: ankle motions WFL, hip add 2-/5 LLE Sensation: WFL - Light Touch Trunk Assessment Trunk Assessment: Other exceptions Trunk Exceptions: Noted pt to have very forward head posture.  Able to correct with cues, but did not sustain.    Balance    End of Session PT - End of Session Equipment Utilized During Treatment: Gait belt Activity Tolerance: Patient tolerated treatment well Patient left: in chair;with call bell/phone within reach;with family/visitor present Nurse Communication: Mobility status  GP     Vista Deck 01/04/2013, 8:53 AM

## 2013-01-05 ENCOUNTER — Encounter (HOSPITAL_COMMUNITY): Payer: Self-pay | Admitting: Orthopedic Surgery

## 2013-01-05 LAB — CBC
MCHC: 33.7 g/dL (ref 30.0–36.0)
MCV: 88.7 fL (ref 78.0–100.0)
Platelets: 221 10*3/uL (ref 150–400)
RDW: 13.4 % (ref 11.5–15.5)
WBC: 14.3 10*3/uL — ABNORMAL HIGH (ref 4.0–10.5)

## 2013-01-05 LAB — BASIC METABOLIC PANEL
Calcium: 9.6 mg/dL (ref 8.4–10.5)
Chloride: 98 mEq/L (ref 96–112)
Creatinine, Ser: 0.79 mg/dL (ref 0.50–1.35)
GFR calc Af Amer: >90 mL/min (ref >90)
GFR calc non Af Amer: >90 mL/min (ref >90)

## 2013-01-05 MED ORDER — OXYCODONE HCL 5 MG PO TABS: 5.0000 mg | ORAL_TABLET | ORAL | Status: DC | PRN

## 2013-01-05 MED ORDER — BISACODYL 10 MG RE SUPP
10.0000 mg | Freq: Once | RECTAL | Status: AC
  Administered 2013-01-05: 10 mg via RECTAL
  Filled 2013-01-05: qty 1

## 2013-01-05 MED ORDER — RIVAROXABAN 10 MG PO TABS: 10.0000 mg | ORAL_TABLET | Freq: Every day | ORAL | Status: DC

## 2013-01-05 MED ORDER — METHOCARBAMOL 500 MG PO TABS: 500.0000 mg | ORAL_TABLET | Freq: Four times a day (QID) | ORAL | Status: DC | PRN

## 2013-01-05 MED ORDER — TRAMADOL HCL 50 MG PO TABS: 50.0000 mg | ORAL_TABLET | Freq: Four times a day (QID) | ORAL | Status: DC | PRN

## 2013-01-05 NOTE — Progress Notes (Signed)
Physical Therapy Treatment Patient Details Name: Philip Gates MRN: [id number] DOB: [birth year] Today's Date: 01/05/2013 Time: 1308-6578 PT Time Calculation (min): 38 min  PT Assessment / Plan / Recommendation Comments on Treatment Session  Pt progressing very well and has practiced stairs with wife present for education.  Ready for D/C.     Follow Up Recommendations  Home health PT     Does the patient have the potential to tolerate intense rehabilitation     Barriers to Discharge        Equipment Recommendations  Rolling walker with 5" wheels    Recommendations for Other Services    Frequency 7X/week   Plan Discharge plan remains appropriate    Precautions / Restrictions Precautions Precautions: None Restrictions Weight Bearing Restrictions: No Other Position/Activity Restrictions: WBAT   Pertinent Vitals/Pain 5/10 pain, RN aware, ice pack applied    Mobility  Bed Mobility Bed Mobility: Sit to Supine Sit to Supine: 4: Min assist;HOB flat Details for Bed Mobility Assistance: Assist for LLE into bed.  Pt doing very well with UE placement and adjusting hips once in bed.  Transfers Transfers: Sit to Stand;Stand to Sit Sit to Stand: 5: Supervision;With upper extremity assist;With armrests;From chair/3-in-1 Stand to Sit: 5: Supervision;With upper extremity assist;To bed Details for Transfer Assistance: Single cue for hand placement when sitting.   Ambulation/Gait Ambulation/Gait Assistance: 5: Supervision Ambulation Distance (Feet): 300 Feet Assistive device: Rolling walker Ambulation/Gait Assistance Details: Min cues for upright posture. Again, pt does well starting with step to gait pattern then switching to step through.  Gait Pattern: Step-to pattern;Decreased stride length;Antalgic;Trunk flexed Gait velocity: decreased    Exercises Total Joint Exercises Ankle Circles/Pumps: AROM;Both;20 reps Quad Sets: AROM;Left;10 reps Short Arc Quad: AROM;Left;10 reps Heel  Slides: AAROM;Left;10 reps Hip ABduction/ADduction: AAROM;Left;10 reps   PT Diagnosis:    PT Problem List:   PT Treatment Interventions:     PT Goals Acute Rehab PT Goals PT Goal Formulation: With patient Time For Goal Achievement: 01/07/13 Potential to Achieve Goals: Good Pt will go Sit to Supine/Side: with supervision PT Goal: Sit to Supine/Side - Progress: Progressing toward goal Pt will go Sit to Stand: with supervision PT Goal: Sit to Stand - Progress: Met Pt will Ambulate: 51 - 150 feet;with supervision;with least restrictive assistive device PT Goal: Ambulate - Progress: Met Pt will Go Up / Down Stairs: 3-5 stairs;with supervision;with rail(s);with least restrictive assistive device PT Goal: Up/Down Stairs - Progress: Partly met Pt will Perform Home Exercise Program: with supervision, verbal cues required/provided PT Goal: Perform Home Exercise Program - Progress: Met  Visit Information  Last PT Received On: 01/05/13 Assistance Needed: +1    Subjective Data  Subjective: I'm ready to get home.  Patient Stated Goal: to go home tomorrow.    Cognition  Cognition Arousal/Alertness: Awake/alert Behavior During Therapy: WFL for tasks assessed/performed Overall Cognitive Status: Within Functional Limits for tasks assessed    Balance     End of Session PT - End of Session Activity Tolerance: Patient tolerated treatment well Patient left: in bed;with call bell/phone within reach Nurse Communication: Mobility status   GP     Vista Deck 01/05/2013, 11:19 AM

## 2013-01-05 NOTE — Progress Notes (Signed)
   Subjective: 2 Days Post-Op Procedure(s) (LRB): TOTAL HIP ARTHROPLASTY ANTERIOR APPROACH (Left) Patient reports pain as mild.   Patient seen in rounds for Dr. Lequita Halt. Wife at bedside. Patient is well, but has had some minor complaints of urinary retention. He had some difficulty voiding yesterday but is doing better this morning.  He is still voiding often but states that he is back to his "normal" baseline.  He thinks he has an enlarged prostate but dose not have a urologist.  He is voiding now without difficulty so we will allow him home.  Discussed with him about talking to his PCP and may need further evaluation in the future. Patient is ready to go home today.  Objective: Vital signs in last 24 hours: Temp:  [97.7 F (36.5 C)-98.8 F (37.1 C)] 98.8 F (37.1 C) (04/30 0612) Pulse Rate:  [94-109] 106 (04/30 0612) Resp:  [14-20] 16 (04/30 0612) BP: (135-152)/(74-87) 152/86 mmHg (04/30 0612) SpO2:  [94 %-98 %] 95 % (04/30 0612)  Intake/Output from previous day:  Intake/Output Summary (Last 24 hours) at 01/05/13 0737 Last data filed at 01/05/13 1610  Gross per 24 hour  Intake   1900 ml  Output   2927 ml  Net  -1027 ml    Intake/Output this shift:    Labs:  Recent Labs  01/04/13 0416 01/05/13 0410  HGB 13.6 12.7*    Recent Labs  01/04/13 0416 01/05/13 0410  WBC 9.1 14.3*  RBC 4.42 4.25  HCT 40.1 37.7*  PLT 237 221    Recent Labs  01/04/13 0416 01/05/13 0410  NA 139 137  K 3.8 3.7  CL 99 98  CO2 32 29  BUN 9 10  CREATININE 0.98 0.79  GLUCOSE 93 109*  CALCIUM 9.2 9.6   No results found for this basename: LABPT, INR,  in the last 72 hours  EXAM: General - Patient is Alert, Appropriate and Oriented Extremity - Neurovascular intact Sensation intact distally Dorsiflexion/Plantar flexion intact No cellulitis present Incision - clean, dry, no drainage, healing Motor Function - intact, moving foot and toes well on exam.   Assessment/Plan: 2 Days  Post-Op Procedure(s) (LRB): TOTAL HIP ARTHROPLASTY ANTERIOR APPROACH (Left) Procedure(s) (LRB): TOTAL HIP ARTHROPLASTY ANTERIOR APPROACH (Left) Past Medical History  Diagnosis Date  . Asthma     ACTIVITY INDUCED  . Sinus drainage   . Arthritis   . GERD (gastroesophageal reflux disease)   . Bipolar 1 disorder   . Neck pain, chronic    Principal Problem:   OA (osteoarthritis) of hip  Estimated body mass index is 26.64 kg/(m^2) as calculated from the following:   Height as of this encounter: 5\' 11"  (1.803 m).   Weight as of this encounter: 86.6 kg (190 lb 14.7 oz). Up with therapy Discharge home with home health Diet - Regular diet Follow up - in 2 weeks Activity - WBAT Disposition - Home Condition Upon Discharge - Good D/C Meds - See DC Summary DVT Prophylaxis - Xarelto  Armani Gawlik 01/05/2013, 7:37 AM

## 2013-01-05 NOTE — Progress Notes (Signed)
Patient discharged per MD order. Discharge instructions reviewed with patient and wife at bedside. Four paper prescriptions given to patient. Instructions thoroughly reviewed and all questions answered. Pt wheeled to the car for transport home by nurse tech. Angelena Form, RN

## 2013-01-05 NOTE — Progress Notes (Signed)
Patient unable to fully empty bladder, post void residual scanned between 250-400cc throughout the night. Patient advised that any amount over 350cc required intervention and following 2 I&O caths a foley catheter should be inserted per hospital protocol.  Patient denies discomfort and states "I don't want another catheter."  Will continue to monitor patient and post void residual.

## 2013-01-05 NOTE — Progress Notes (Signed)
   Patient Details Name: Philip Gates MRN: [id number] DOB: 09/07/1956   Cancelled Treatment:    Reason Eval/Treat Not Completed: Other (comment) (Discussed role of OT.  Pt and wife agree no needs.)  [name], [place] Kung 01/05/2013, 11:00 AM

## 2013-01-13 NOTE — Discharge Summary (Signed)
Physician Discharge Summary   Patient ID: CID AGENA MRN: [id number] DOB/AGE: 1956/01/12 57 y.o.  Admit date: 01/03/2013 Discharge date: 01/05/2013  Primary Diagnosis:  Osteoarthritis of the Left hip.   Admission Diagnoses:  Past Medical History  Diagnosis Date  . Asthma     ACTIVITY INDUCED  . Sinus drainage   . Arthritis   . GERD (gastroesophageal reflux disease)   . Bipolar 1 disorder   . Neck pain, chronic    Discharge Diagnoses:   Principal Problem:   OA (osteoarthritis) of hip  Estimated body mass index is 26.64 kg/(m^2) as calculated from the following:   Height as of this encounter: 5\' 11"  (1.803 m).   Weight as of this encounter: 86.6 kg (190 lb 14.7 oz).  Procedure(s) (LRB): TOTAL HIP ARTHROPLASTY ANTERIOR APPROACH (Left)   Consults: None  HPI: Philip Gates is a 57 y.o. male who has advanced end-  stage arthritis of his Left hip with progressively worsening pain and  dysfunction.The patient has failed nonoperative management and presents for  total hip arthroplasty.   Laboratory Data: Admission on 01/03/2013, Discharged on 01/05/2013  Component Date Value Range Status  . ABO/RH(D) 01/03/2013 AB POS   Final  . Antibody Screen 01/03/2013 NEG   Final  . Sample Expiration 01/03/2013 01/06/2013   Final  . ABO/RH(D) 01/03/2013 AB POS   Final  . WBC 01/04/2013 9.1  4.0 - 10.5 K/uL Final  . RBC 01/04/2013 4.42  4.22 - 5.81 MIL/uL Final  . Hemoglobin 01/04/2013 13.6  13.0 - 17.0 g/dL Final  . HCT 40/98/1191 40.1  39.0 - 52.0 % Final  . MCV 01/04/2013 90.7  78.0 - 100.0 fL Final  . MCH 01/04/2013 30.8  26.0 - 34.0 pg Final  . MCHC 01/04/2013 33.9  30.0 - 36.0 g/dL Final  . RDW 47/82/9562 13.6  11.5 - 15.5 % Final  . Platelets 01/04/2013 237  150 - 400 K/uL Final  . Sodium 01/04/2013 139  135 - 145 mEq/L Final  . Potassium 01/04/2013 3.8  3.5 - 5.1 mEq/L Final  . Chloride 01/04/2013 99  96 - 112 mEq/L Final  . CO2 01/04/2013 32  19 - 32 mEq/L Final  .  Glucose, Bld 01/04/2013 93  70 - 99 mg/dL Final  . BUN 13/04/6577 9  6 - 23 mg/dL Final  . Creatinine, Ser 01/04/2013 0.98  0.50 - 1.35 mg/dL Final  . Calcium 46/96/2952 9.2  8.4 - 10.5 mg/dL Final  . GFR calc non Af Amer 01/04/2013 >90  >90 mL/min Final  . GFR calc Af Amer 01/04/2013 >90  >90 mL/min Final   Comment:                                 The eGFR has been calculated                          using the CKD EPI equation.                          This calculation has not been                          validated in all clinical  situations.                          eGFR's persistently                          <90 mL/min signify                          possible Chronic Kidney Disease.  . WBC 01/05/2013 14.3* 4.0 - 10.5 K/uL Final  . RBC 01/05/2013 4.25  4.22 - 5.81 MIL/uL Final  . Hemoglobin 01/05/2013 12.7* 13.0 - 17.0 g/dL Final  . HCT 40/06/2724 37.7* 39.0 - 52.0 % Final  . MCV 01/05/2013 88.7  78.0 - 100.0 fL Final  . MCH 01/05/2013 29.9  26.0 - 34.0 pg Final  . MCHC 01/05/2013 33.7  30.0 - 36.0 g/dL Final  . RDW 36/64/4034 13.4  11.5 - 15.5 % Final  . Platelets 01/05/2013 221  150 - 400 K/uL Final  . Sodium 01/05/2013 137  135 - 145 mEq/L Final  . Potassium 01/05/2013 3.7  3.5 - 5.1 mEq/L Final  . Chloride 01/05/2013 98  96 - 112 mEq/L Final  . CO2 01/05/2013 29  19 - 32 mEq/L Final  . Glucose, Bld 01/05/2013 109* 70 - 99 mg/dL Final  . BUN 74/25/9563 10  6 - 23 mg/dL Final  . Creatinine, Ser 01/05/2013 0.79  0.50 - 1.35 mg/dL Final  . Calcium 87/56/4332 9.6  8.4 - 10.5 mg/dL Final  . GFR calc non Af Amer 01/05/2013 >90  >90 mL/min Final  . GFR calc Af Amer 01/05/2013 >90  >90 mL/min Final   Comment:                                 The eGFR has been calculated                          using the CKD EPI equation.                          This calculation has not been                          validated in all clinical                           situations.                          eGFR's persistently                          <90 mL/min signify                          possible Chronic Kidney Disease.  Hospital Outpatient Visit on 12/27/2012  Component Date Value Range Status  . aPTT 12/27/2012 33  24 - 37 seconds Final  . WBC 12/27/2012 6.4  4.0 - 10.5 K/uL Final  . RBC 12/27/2012 5.14  4.22 - 5.81 MIL/uL Final  . Hemoglobin 12/27/2012 15.6  13.0 - 17.0 g/dL Final  . HCT 95/18/8416 46.4  39.0 - 52.0 %  Final  . MCV 12/27/2012 90.3  78.0 - 100.0 fL Final  . MCH 12/27/2012 30.4  26.0 - 34.0 pg Final  . MCHC 12/27/2012 33.6  30.0 - 36.0 g/dL Final  . RDW 16/06/9603 13.4  11.5 - 15.5 % Final  . Platelets 12/27/2012 225  150 - 400 K/uL Final  . Sodium 12/27/2012 141  135 - 145 mEq/L Final  . Potassium 12/27/2012 5.0  3.5 - 5.1 mEq/L Final  . Chloride 12/27/2012 103  96 - 112 mEq/L Final  . CO2 12/27/2012 33* 19 - 32 mEq/L Final  . Glucose, Bld 12/27/2012 95  70 - 99 mg/dL Final  . BUN 54/05/8118 9  6 - 23 mg/dL Final  . Creatinine, Ser 12/27/2012 0.94  0.50 - 1.35 mg/dL Final  . Calcium 14/78/2956 10.0  8.4 - 10.5 mg/dL Final  . Total Protein 12/27/2012 7.6  6.0 - 8.3 g/dL Final  . Albumin 21/30/8657 4.0  3.5 - 5.2 g/dL Final  . AST 84/69/6295 17  0 - 37 U/L Final  . ALT 12/27/2012 16  0 - 53 U/L Final  . Alkaline Phosphatase 12/27/2012 77  39 - 117 U/L Final  . Total Bilirubin 12/27/2012 0.4  0.3 - 1.2 mg/dL Final  . GFR calc non Af Amer 12/27/2012 >90  >90 mL/min Final  . GFR calc Af Amer 12/27/2012 >90  >90 mL/min Final   Comment:                                 The eGFR has been calculated                          using the CKD EPI equation.                          This calculation has not been                          validated in all clinical                          situations.                          eGFR's persistently                          <90 mL/min signify                          possible Chronic Kidney  Disease.  Marland Kitchen Prothrombin Time 12/27/2012 12.5  11.6 - 15.2 seconds Final  . INR 12/27/2012 0.94  0.00 - 1.49 Final  . Color, Urine 12/27/2012 YELLOW  YELLOW Final  . APPearance 12/27/2012 CLEAR  CLEAR Final  . Specific Gravity, Urine 12/27/2012 1.010  1.005 - 1.030 Final  . pH 12/27/2012 7.5  5.0 - 8.0 Final  . Glucose, UA 12/27/2012 NEGATIVE  NEGATIVE mg/dL Final  . Hgb urine dipstick 12/27/2012 TRACE* NEGATIVE Final  . Bilirubin Urine 12/27/2012 NEGATIVE  NEGATIVE Final  . Ketones, ur 12/27/2012 NEGATIVE  NEGATIVE mg/dL Final  . Protein, ur 28/41/3244 NEGATIVE  NEGATIVE mg/dL Final  . Urobilinogen, UA 12/27/2012 0.2  0.0 - 1.0 mg/dL Final  .  Nitrite 12/27/2012 NEGATIVE  NEGATIVE Final  . Leukocytes, UA 12/27/2012 NEGATIVE  NEGATIVE Final  . MRSA, PCR 12/27/2012 NEGATIVE  NEGATIVE Final  . Staphylococcus aureus 12/27/2012 NEGATIVE  NEGATIVE Final   Comment:                                 The Xpert SA Assay (FDA                          approved for NASAL specimens                          in patients over 3 years of age),                          is one component of                          a comprehensive surveillance                          program.  Test performance has                          been validated by [employer] for patients greater                          than or equal to 54 year old.                          It is not intended                          to diagnose infection nor to                          guide or monitor treatment.  . Squamous Epithelial / LPF 12/27/2012 RARE  RARE Final  . RBC / HPF 12/27/2012 0-2  <3 RBC/hpf Final  . Bacteria, UA 12/27/2012 RARE  RARE Final     X-Rays:Dg Chest 2 View  12/27/2012  *RADIOLOGY REPORT*  Clinical Data: Preoperative respiratory films.  Patient for left hip replacement.  CHEST - 2 VIEW  Comparison: PA and lateral chest 12/02/2007.  Findings: Lungs are clear.  Heart size is normal.  No  pneumothorax or pleural fluid.  IMPRESSION: Negative chest.   Original Report Authenticated By: Holley Dexter, M.D.    Dg Hip Complete Left  12/27/2012  *RADIOLOGY REPORT*  Clinical Data: Preoperative films.  Left hip replacement.  LEFT HIP - COMPLETE 2+ VIEW  Comparison: None.  Findings: The patient has severe left hip osteoarthritis with bone- on-bone joint space narrowing and remodeling of the left femoral head.  No fracture or dislocation is identified.  IMPRESSION: No acute finding.  Severe with severe left hip degenerative change.   Original Report Authenticated By: Holley Dexter, M.D.    Dg Pelvis Portable  01/03/2013  *RADIOLOGY REPORT*  Clinical Data: Left hip replacement.  PORTABLE PELVIS  Comparison: None.  Findings: Uncomplicated new left total hip arthroplasty.  Surgical drain in the soft tissues.  The soft tissue swelling present lateral to the left hip.  Foley catheter noted. The hip appears located on the single view.  Distal femoral stem visualized and normal.  IMPRESSION: Uncomplicated new left total hip arthroplasty.   Original Report Authenticated By: Andreas Newport, M.D.    Dg C-arm 1-60 Min-no Report  01/03/2013  CLINICAL DATA: intra op   C-ARM 1-60 MINUTES  Fluoroscopy was utilized by the requesting physician.  No radiographic  interpretation.      EKG: Orders placed in visit on 12/27/12  . EKG 12-LEAD     Hospital Course: Patient was admitted to [facility] and taken to the OR and underwent the above state procedure without complications.  Patient tolerated the procedure well and was later transferred to the recovery room and then to the orthopaedic floor for postoperative care.  They were given PO and IV analgesics for pain control following their surgery.  They were given 24 hours of postoperative antibiotics of  Anti-infectives   Start     Dose/Rate Route Frequency Ordered Stop   01/03/13 2200  ceFAZolin (ANCEF) IVPB 1 g/50 mL premix     1 g 100 mL/hr  over 30 Minutes Intravenous Every 6 hours 01/03/13 2013 01/04/13 0505   01/03/13 1330  ceFAZolin (ANCEF) IVPB 2 g/50 mL premix     2 g 100 mL/hr over 30 Minutes Intravenous On call to O.R. 01/03/13 1304 01/03/13 1633     and started on DVT prophylaxis in the form of Xarelto.   PT and OT were ordered for total hip protocol.  The patient was allowed to be WBAT with therapy. Discharge planning was consulted to help with postop disposition and equipment needs.  Patient had a decent night on the evening of surgery except for some thigh pain.  They started to get up OOB with therapy on day one.  Hemovac drain was pulled without difficulty.  The knee immobilizer was removed and discontinued.  Continued to work with therapy into day two.  Dressing was changed on day two and the incision was healing well.  Patient was well, but has had some minor complaints of urinary retention. He had some difficulty voiding the day before but was doing better the next morning. He was still voiding often but stated that he was back to his "normal" baseline. He thought that he had an enlarged prostate but did not have a urologist. He was voiding better without difficulty so we allowed him home. Discussed with him about talking to his PCP and may need further evaluation in the future.  Patient was seen in rounds and was ready to go home later that same day.   Discharge Medications: Prior to Admission medications   Medication Sig Start Date End Date Taking? Authorizing Provider  cycloSPORINE (RESTASIS) 0.05 % ophthalmic emulsion Place 1 drop into both eyes 2 (two) times daily.   Yes Historical Provider, MD  dexlansoprazole (DEXILANT) 60 MG capsule Take 60 mg by mouth daily.   Yes Historical Provider, MD  divalproex (DEPAKOTE ER) 500 MG 24 hr tablet Take 1,000 mg by mouth at bedtime.   Yes Historical Provider, MD  ipratropium (ATROVENT) 0.03 % nasal spray Place 2 sprays into the nose every 12 (twelve) hours as needed (allergies).    Yes Historical Provider, MD  levalbuterol (XOPENEX HFA) 45 MCG/ACT inhaler Inhale 1-2 puffs into the lungs every 4 (  four) hours as needed for wheezing.   Yes Historical Provider, MD  montelukast (SINGULAIR) 10 MG tablet Take 10 mg by mouth daily as needed (for allergies).   Yes Historical Provider, MD  RABEprazole (ACIPHEX) 20 MG tablet Take 20 mg by mouth daily.   Yes Historical Provider, MD  methocarbamol (ROBAXIN) 500 MG tablet Take 1 tablet (500 mg total) by mouth every 6 (six) hours as needed. 01/05/13   Deondria Puryear, PA-C  oxyCODONE (OXY IR/ROXICODONE) 5 MG immediate release tablet Take 1-4 tablets (5-20 mg total) by mouth every 3 (three) hours as needed. 01/05/13   Montreal Steidle Julien Girt, PA-C  rivaroxaban (XARELTO) 10 MG TABS tablet Take 1 tablet (10 mg total) by mouth daily with breakfast. Take Xarelto for two and a half more weeks, then discontinue Xarelto. 01/05/13   Ayomide Purdy, PA-C  tadalafil (CIALIS) 20 MG tablet Take 20 mg by mouth daily as needed for erectile dysfunction.    Historical Provider, MD  testosterone cypionate (DEPOTESTOTERONE CYPIONATE) 100 MG/ML injection Inject 100 mg into the muscle every 14 (fourteen) days. For IM use only    Historical Provider, MD  traMADol (ULTRAM) 50 MG tablet Take 1-2 tablets (50-100 mg total) by mouth every 6 (six) hours as needed. 01/05/13   Koa Palla Julien Girt, PA-C    Diet: Regular diet Activity:  WBAT Follow-up:in 2 weeks Disposition - Home Discharged Condition: good      Medication List    STOP taking these medications       HYDROcodone-acetaminophen 7.5-500 MG per tablet  Commonly known as:  LORTAB     ibuprofen 200 MG tablet  Commonly known as:  ADVIL,MOTRIN      TAKE these medications       cycloSPORINE 0.05 % ophthalmic emulsion  Commonly known as:  RESTASIS  Place 1 drop into both eyes 2 (two) times daily.     DEXILANT 60 MG capsule  Generic drug:  dexlansoprazole  Take 60 mg by mouth daily.      divalproex 500 MG 24 hr tablet  Commonly known as:  DEPAKOTE ER  Take 1,000 mg by mouth at bedtime.     ipratropium 0.03 % nasal spray  Commonly known as:  ATROVENT  Place 2 sprays into the nose every 12 (twelve) hours as needed (allergies).     levalbuterol 45 MCG/ACT inhaler  Commonly known as:  XOPENEX HFA  Inhale 1-2 puffs into the lungs every 4 (four) hours as needed for wheezing.     methocarbamol 500 MG tablet  Commonly known as:  ROBAXIN  Take 1 tablet (500 mg total) by mouth every 6 (six) hours as needed.     montelukast 10 MG tablet  Commonly known as:  SINGULAIR  Take 10 mg by mouth daily as needed (for allergies).     oxyCODONE 5 MG immediate release tablet  Commonly known as:  Oxy IR/ROXICODONE  Take 1-4 tablets (5-20 mg total) by mouth every 3 (three) hours as needed.     RABEprazole 20 MG tablet  Commonly known as:  ACIPHEX  Take 20 mg by mouth daily.     rivaroxaban 10 MG Tabs tablet  Commonly known as:  XARELTO  Take 1 tablet (10 mg total) by mouth daily with breakfast. Take Xarelto for two and a half more weeks, then discontinue Xarelto.     tadalafil 20 MG tablet  Commonly known as:  CIALIS  Take 20 mg by mouth daily as needed for erectile dysfunction.  testosterone cypionate 100 MG/ML injection  Commonly known as:  DEPOTESTOTERONE CYPIONATE  Inject 100 mg into the muscle every 14 (fourteen) days. For IM use only     traMADol 50 MG tablet  Commonly known as:  ULTRAM  Take 1-2 tablets (50-100 mg total) by mouth every 6 (six) hours as needed.           Follow-up Information   Follow up with Loanne Drilling, MD. Schedule an appointment as soon as possible for a visit on 01/18/2013. (Call [phone] tomorrow to make the appointment)    Contact information:   [address], [address] [place] [address] [phone]       Signed: Patrica Duel 01/13/2013, 10:42 AM

## 2013-12-16 ENCOUNTER — Other Ambulatory Visit: Payer: Self-pay | Admitting: Dermatology

## 2014-03-09 ENCOUNTER — Other Ambulatory Visit: Payer: Self-pay | Admitting: Orthopedic Surgery

## 2014-05-09 DIAGNOSIS — L0390 Cellulitis, unspecified: Secondary | ICD-10-CM

## 2014-05-09 HISTORY — DX: Cellulitis, unspecified: L03.90

## 2014-05-11 ENCOUNTER — Other Ambulatory Visit: Payer: Self-pay | Admitting: Orthopedic Surgery

## 2014-05-11 NOTE — Progress Notes (Signed)
Preoperative surgical orders have been place into the Epic hospital system for Philip Gates on 05/11/2014, 1:05 PM  by Mickel Crow for surgery on 06/12/2014.  Preop Total Knee orders including Experal, IV Tylenol, and IV Decadron as long as there are no contraindications to the above medications. [name], PA-C

## 2014-05-30 ENCOUNTER — Encounter (HOSPITAL_COMMUNITY): Payer: Self-pay | Admitting: Emergency Medicine

## 2014-05-30 ENCOUNTER — Emergency Department (HOSPITAL_COMMUNITY): Payer: 59

## 2014-05-30 DIAGNOSIS — F319 Bipolar disorder, unspecified: Secondary | ICD-10-CM | POA: Diagnosis present

## 2014-05-30 DIAGNOSIS — Z87891 Personal history of nicotine dependence: Secondary | ICD-10-CM

## 2014-05-30 DIAGNOSIS — K219 Gastro-esophageal reflux disease without esophagitis: Secondary | ICD-10-CM | POA: Diagnosis present

## 2014-05-30 DIAGNOSIS — L03211 Cellulitis of face: Secondary | ICD-10-CM | POA: Diagnosis not present

## 2014-05-30 DIAGNOSIS — Z96649 Presence of unspecified artificial hip joint: Secondary | ICD-10-CM

## 2014-05-30 DIAGNOSIS — Z880 Allergy status to penicillin: Secondary | ICD-10-CM

## 2014-05-30 DIAGNOSIS — Z800 Family history of malignant neoplasm of digestive organs: Secondary | ICD-10-CM

## 2014-05-30 DIAGNOSIS — J45909 Unspecified asthma, uncomplicated: Secondary | ICD-10-CM | POA: Diagnosis present

## 2014-05-30 DIAGNOSIS — R51 Headache: Secondary | ICD-10-CM | POA: Diagnosis not present

## 2014-05-30 DIAGNOSIS — L0201 Cutaneous abscess of face: Secondary | ICD-10-CM | POA: Diagnosis not present

## 2014-05-30 DIAGNOSIS — Z91013 Allergy to seafood: Secondary | ICD-10-CM

## 2014-05-30 LAB — CBC WITH DIFFERENTIAL/PLATELET
BASOS ABS: 0 10*3/uL (ref 0.0–0.1)
Basophils Relative: 0 % (ref 0–1)
EOS ABS: 0 10*3/uL (ref 0.0–0.7)
Eosinophils Relative: 0 % (ref 0–5)
HCT: 41.6 % (ref 39.0–52.0)
Hemoglobin: 14.3 g/dL (ref 13.0–17.0)
Lymphocytes Relative: 10 % — ABNORMAL LOW (ref 12–46)
Lymphs Abs: 1.6 10*3/uL (ref 0.7–4.0)
MCH: 30.8 pg (ref 26.0–34.0)
MCHC: 34.4 g/dL (ref 30.0–36.0)
MCV: 89.5 fL (ref 78.0–100.0)
MONOS PCT: 7 % (ref 3–12)
Monocytes Absolute: 1.1 10*3/uL — ABNORMAL HIGH (ref 0.1–1.0)
Neutro Abs: 13.4 10*3/uL — ABNORMAL HIGH (ref 1.7–7.7)
Neutrophils Relative %: 83 % — ABNORMAL HIGH (ref 43–77)
Platelets: 342 10*3/uL (ref 150–400)
RBC: 4.65 MIL/uL (ref 4.22–5.81)
RDW: 15.3 % (ref 11.5–15.5)
WBC: 16 10*3/uL — ABNORMAL HIGH (ref 4.0–10.5)

## 2014-05-30 LAB — COMPREHENSIVE METABOLIC PANEL
ALK PHOS: 54 U/L (ref 39–117)
ALT: 21 U/L (ref 0–53)
ANION GAP: 11 (ref 5–15)
AST: 17 U/L (ref 0–37)
Albumin: 3.7 g/dL (ref 3.5–5.2)
BILIRUBIN TOTAL: 0.4 mg/dL (ref 0.3–1.2)
BUN: 14 mg/dL (ref 6–23)
CHLORIDE: 100 meq/L (ref 96–112)
CO2: 28 meq/L (ref 19–32)
Calcium: 9.8 mg/dL (ref 8.4–10.5)
Creatinine, Ser: 0.79 mg/dL (ref 0.50–1.35)
GLUCOSE: 99 mg/dL (ref 70–99)
POTASSIUM: 4.3 meq/L (ref 3.7–5.3)
SODIUM: 139 meq/L (ref 137–147)
Total Protein: 7.2 g/dL (ref 6.0–8.3)

## 2014-05-30 IMAGING — CT CT MAXILLOFACIAL W/ CM
3 series · 16 of 47 positions shown, 19 images · IV contrast (omnipaque)
Comparison: None.

CLINICAL DATA: Facial swelling, unknown source

EXAM:
CT MAXILLOFACIAL WITH CONTRAST
TECHNIQUE: Multidetector CT imaging of the maxillofacial structures was
performed with intravenous contrast. Multiplanar CT image
reconstructions were also generated. A small metallic BB was placed
on the right temple in order to reliably differentiate right from
left.
CONTRAST:  80mL OMNIPAQUE IOHEXOL 300 MG/ML  SOLN

[Series 201: facial bones · axial · 0.34mm/px · z∈[+32,+176]mm · 10 of 84 slices shown, 13 images]
[im 6/84  brain]
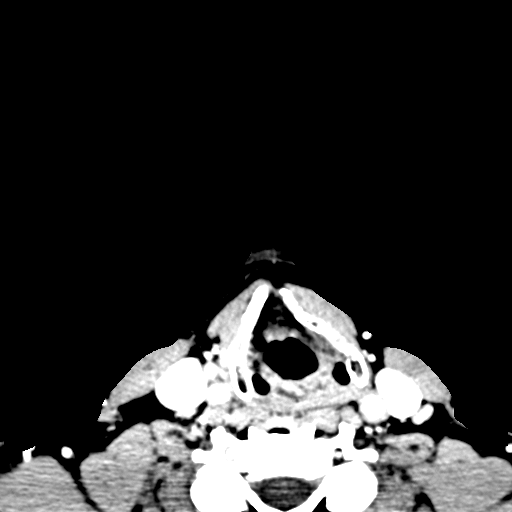
[im 6/84  bone]
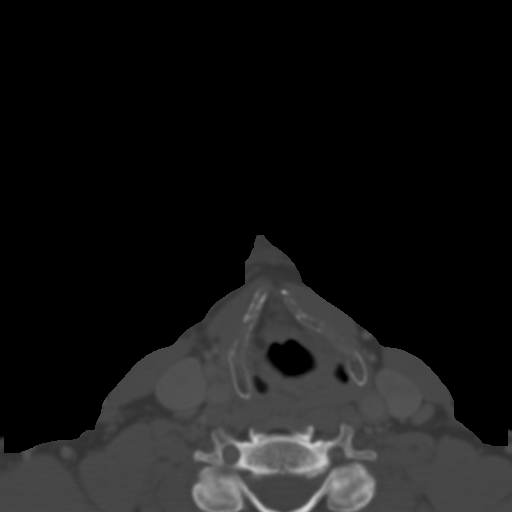
[im 15/84  bone]
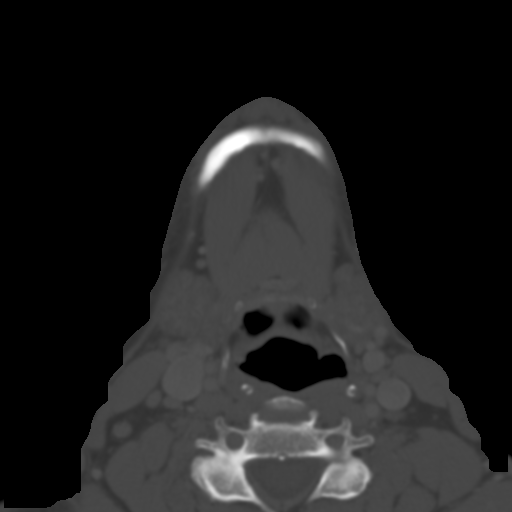
[im 23/84  bone]
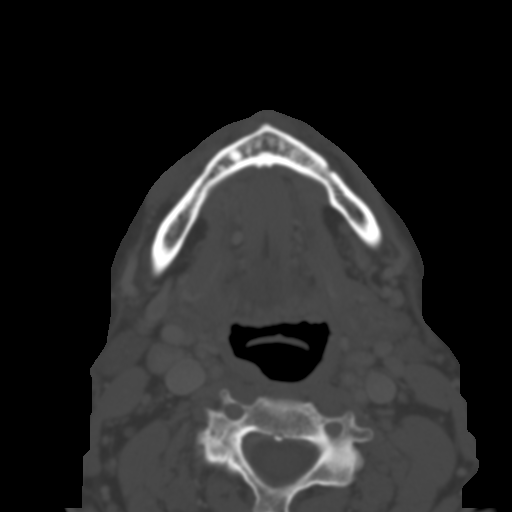
[im 29/84  bone]
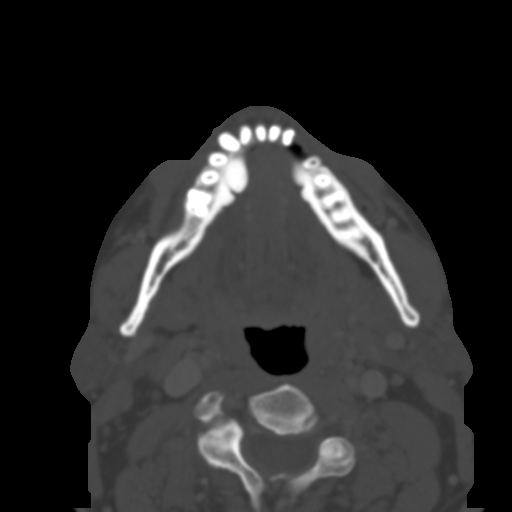
[im 38/84  brain]
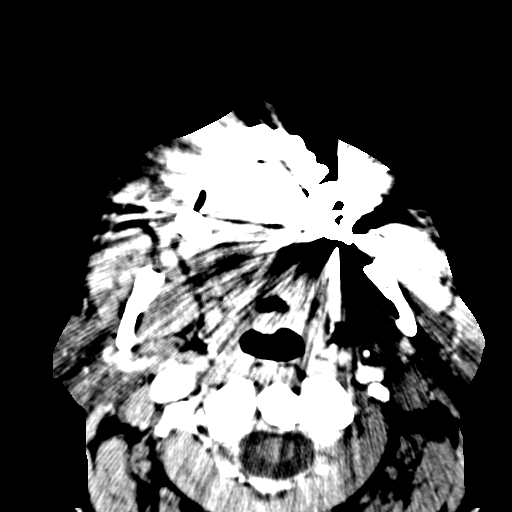
[im 38/84  bone]
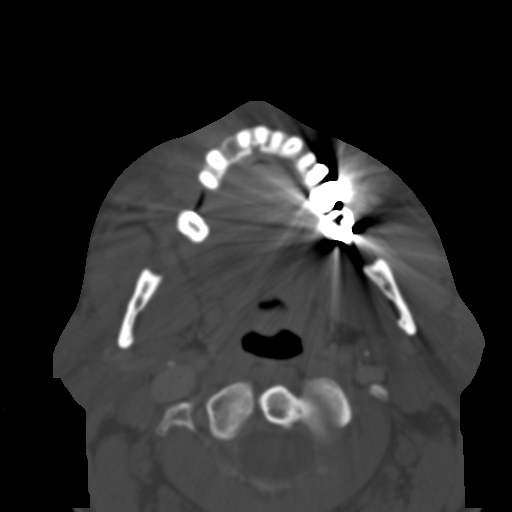
[im 46/84  bone]
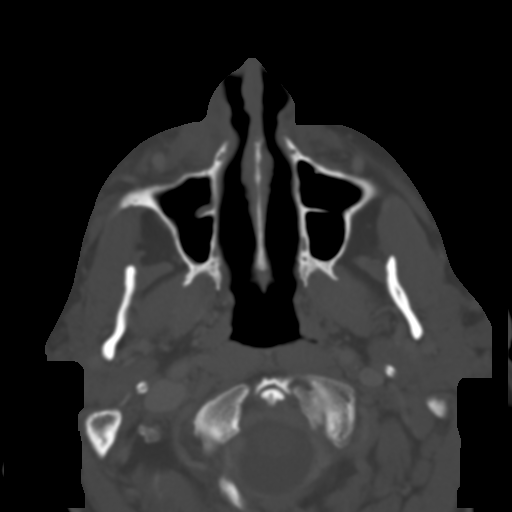
[im 55/84  bone]
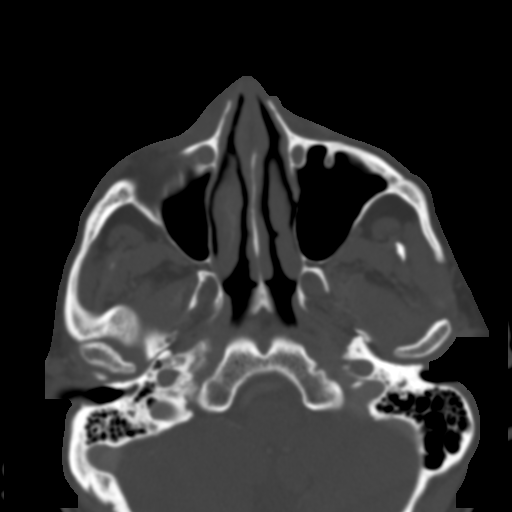
[im 63/84  bone]
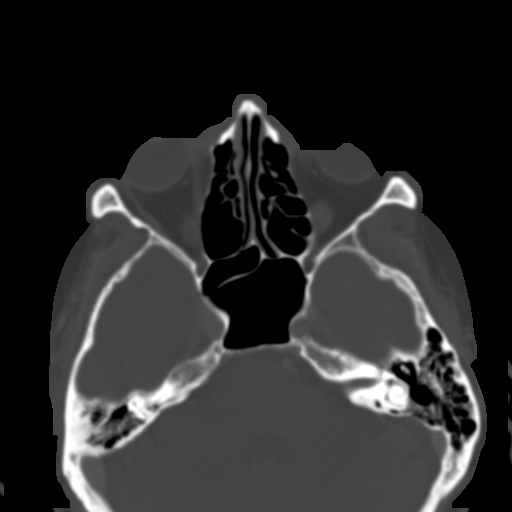
[im 69/84  brain]
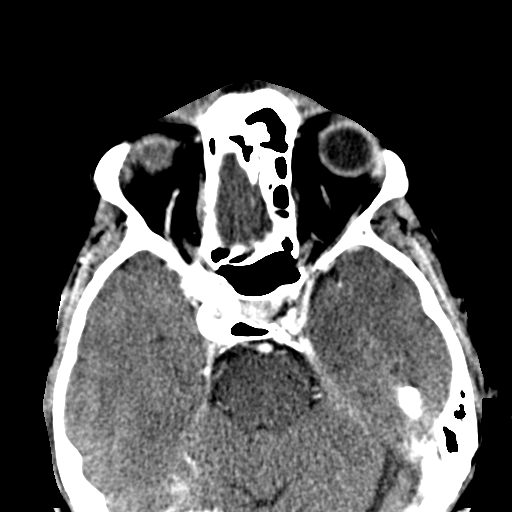
[im 69/84  bone]
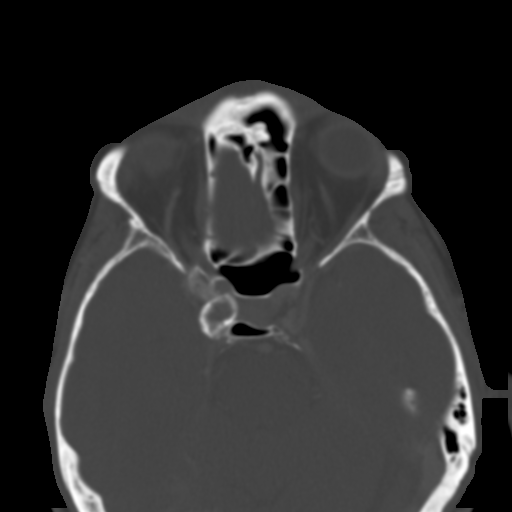
[im 78/84  bone]
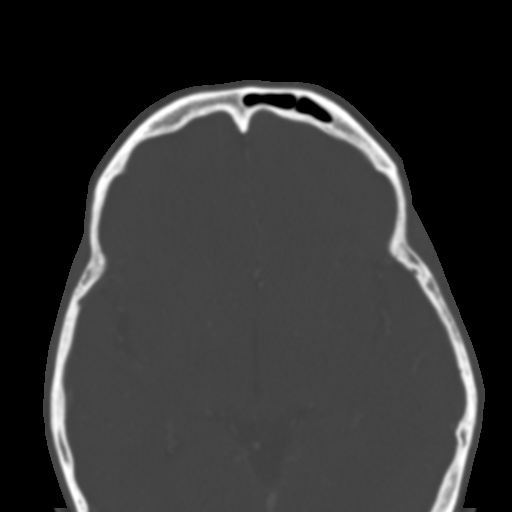

[Series 203: coronal std · coronal · 0.34mm/px · 3 of 81 slices shown]
[im 27/81  bone]
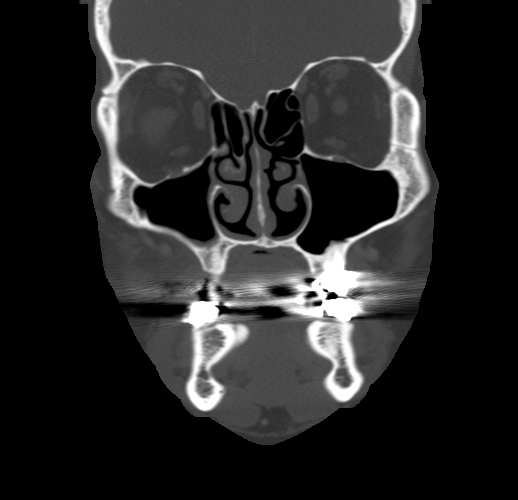
[im 36/81  bone]
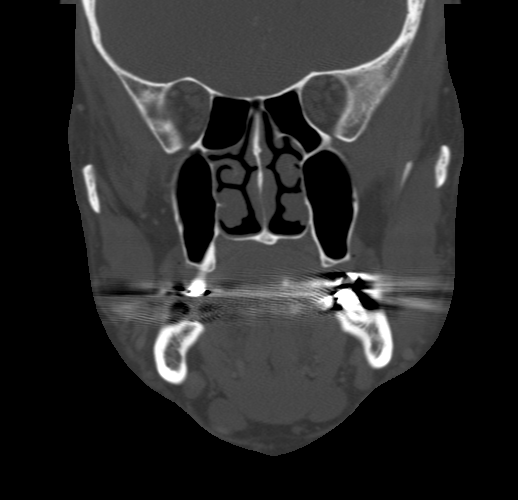
[im 45/81  bone]
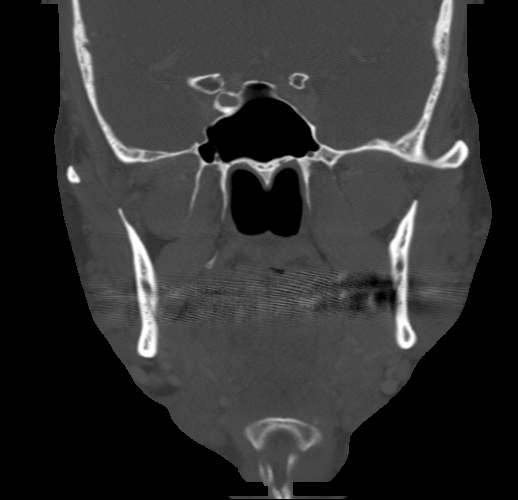

[Series 204: sagittal std · sagittal · 0.34mm/px · 3 of 87 slices shown]
[im 29/87  bone]
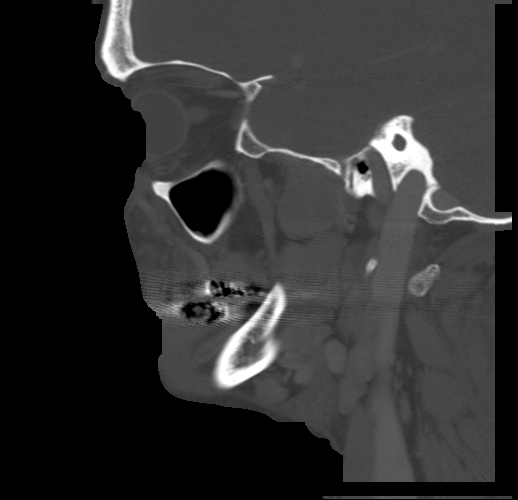
[im 44/87  bone]
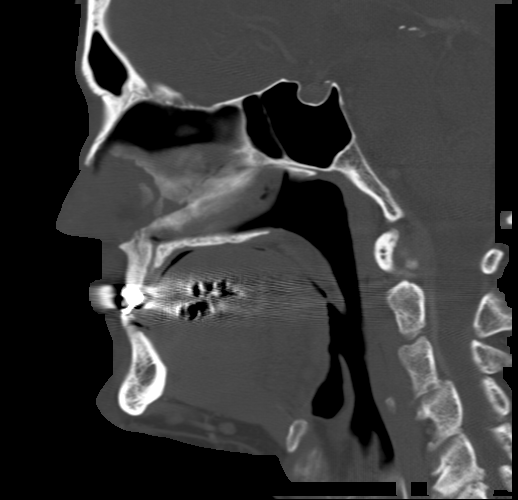
[im 58/87  bone]
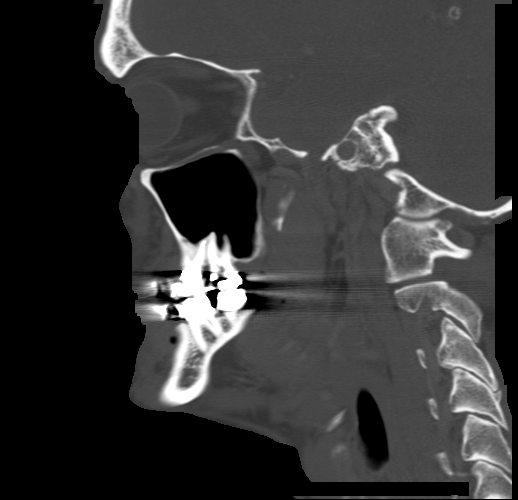

[16 of 47 positions shown; findings below may reference images not displayed]

FINDINGS: Contents of the orbits are intact. No significant inflammatory
change in the paranasal sinuses. Mandible and maxilla are both
somewhat obscured by beam attenuation artifact from dental amalgam.

There is moderate soft tissue swelling involving the right side of
the nose as well of soft tissues over the right parasagittal
maxilla. There is some low attenuation in the soft tissues but no
evidence of organized fluid collections.

There are numerous enlarged cervical lymph nodes right more so than
left. A jugulodigastric node measures 10 mm in short axis. A
submandibular node is also 10 mm. Major salivary glands are normal.
No evidence of mucosal space lesion.

14 mm peripherally calcified structure in the region of the right
cavernous sinus.
IMPRESSION: Evidence of cellulitis involving the right aspect of the nose and
adjacent soft tissues overlying the maxilla. Likely reactive
adenopathy.

14 mm round peripherally calcified structure in the right cavernous
sinus possibly a carotid aneurysm. Nonemergent CT arteriogram
suggested.

## 2014-05-30 MED ORDER — IOHEXOL 300 MG/ML  SOLN
80.0000 mL | Freq: Once | INTRAMUSCULAR | Status: AC | PRN
  Administered 2014-05-30: 80 mL via INTRAVENOUS

## 2014-05-30 NOTE — ED Notes (Addendum)
Tooth extraction 3 weeks ago on Thurs. Dentist said abcessed. Took clinda and flagyl for it. States he is working on completing. Pt woke up and right sided nasal, upper cheek area reddened and puffy, and very hardened. Pt states painful. Visible swelling under eye area. States he feels like blood draining in back of throat. Dr. Dorian Heckle office today concerned MRSA or boil. Pt took pain pill at home.

## 2014-05-30 NOTE — ED Notes (Signed)
Ct notified pt has IV

## 2014-05-31 ENCOUNTER — Inpatient Hospital Stay (HOSPITAL_COMMUNITY)
Admission: EM | Admit: 2014-05-31 | Discharge: 2014-06-01 | DRG: 603 | Disposition: A | Discharge status: Home / Self Care | Payer: 59 | Attending: Internal Medicine | Admitting: Internal Medicine

## 2014-05-31 ENCOUNTER — Encounter (HOSPITAL_COMMUNITY): Payer: Self-pay | Admitting: Internal Medicine

## 2014-05-31 DIAGNOSIS — L0201 Cutaneous abscess of face: Secondary | ICD-10-CM | POA: Diagnosis present

## 2014-05-31 DIAGNOSIS — F3170 Bipolar disorder, currently in remission, most recent episode unspecified: Secondary | ICD-10-CM | POA: Diagnosis present

## 2014-05-31 DIAGNOSIS — Z91013 Allergy to seafood: Secondary | ICD-10-CM | POA: Diagnosis not present

## 2014-05-31 DIAGNOSIS — Z880 Allergy status to penicillin: Secondary | ICD-10-CM | POA: Diagnosis not present

## 2014-05-31 DIAGNOSIS — F319 Bipolar disorder, unspecified: Secondary | ICD-10-CM | POA: Diagnosis present

## 2014-05-31 DIAGNOSIS — R51 Headache: Secondary | ICD-10-CM | POA: Diagnosis present

## 2014-05-31 DIAGNOSIS — K2100 Gastro-esophageal reflux disease with esophagitis, without bleeding: Secondary | ICD-10-CM

## 2014-05-31 DIAGNOSIS — Z800 Family history of malignant neoplasm of digestive organs: Secondary | ICD-10-CM | POA: Diagnosis not present

## 2014-05-31 DIAGNOSIS — J45909 Unspecified asthma, uncomplicated: Secondary | ICD-10-CM | POA: Diagnosis present

## 2014-05-31 DIAGNOSIS — J4520 Mild intermittent asthma, uncomplicated: Secondary | ICD-10-CM

## 2014-05-31 DIAGNOSIS — Z87891 Personal history of nicotine dependence: Secondary | ICD-10-CM | POA: Diagnosis not present

## 2014-05-31 DIAGNOSIS — Z96649 Presence of unspecified artificial hip joint: Secondary | ICD-10-CM | POA: Diagnosis not present

## 2014-05-31 DIAGNOSIS — L03211 Cellulitis of face: Secondary | ICD-10-CM | POA: Diagnosis present

## 2014-05-31 DIAGNOSIS — K219 Gastro-esophageal reflux disease without esophagitis: Secondary | ICD-10-CM | POA: Diagnosis present

## 2014-05-31 LAB — COMPREHENSIVE METABOLIC PANEL
ALT: 18 U/L (ref 0–53)
ANION GAP: 9 (ref 5–15)
AST: 18 U/L (ref 0–37)
Albumin: 3.2 g/dL — ABNORMAL LOW (ref 3.5–5.2)
Alkaline Phosphatase: 46 U/L (ref 39–117)
BILIRUBIN TOTAL: 0.3 mg/dL (ref 0.3–1.2)
BUN: 8 mg/dL (ref 6–23)
CHLORIDE: 104 meq/L (ref 96–112)
CO2: 28 mEq/L (ref 19–32)
CREATININE: 0.79 mg/dL (ref 0.50–1.35)
Calcium: 8.8 mg/dL (ref 8.4–10.5)
GFR calc Af Amer: >90 mL/min (ref >90)
GFR calc non Af Amer: >90 mL/min (ref >90)
GLUCOSE: 92 mg/dL (ref 70–99)
Potassium: 4.6 mEq/L (ref 3.7–5.3)
Sodium: 141 mEq/L (ref 137–147)
Total Protein: 6.3 g/dL (ref 6.0–8.3)

## 2014-05-31 LAB — CBC
HEMATOCRIT: 39.9 % (ref 39.0–52.0)
Hemoglobin: 13.4 g/dL (ref 13.0–17.0)
MCH: 30.4 pg (ref 26.0–34.0)
MCHC: 33.6 g/dL (ref 30.0–36.0)
MCV: 90.5 fL (ref 78.0–100.0)
Platelets: 294 10*3/uL (ref 150–400)
RBC: 4.41 MIL/uL (ref 4.22–5.81)
RDW: 15.6 % — ABNORMAL HIGH (ref 11.5–15.5)
WBC: 8.6 10*3/uL (ref 4.0–10.5)

## 2014-05-31 MED ORDER — LAMOTRIGINE 25 MG PO TABS
25.0000 mg | ORAL_TABLET | Freq: Every day | ORAL | Status: DC
  Administered 2014-06-01: 25 mg via ORAL
  Filled 2014-05-31 (×2): qty 1

## 2014-05-31 MED ORDER — SODIUM CHLORIDE 0.9 % IV BOLUS (SEPSIS)
1000.0000 mL | Freq: Once | INTRAVENOUS | Status: AC
  Administered 2014-05-31: 1000 mL via INTRAVENOUS

## 2014-05-31 MED ORDER — IPRATROPIUM BROMIDE 0.06 % NA SOLN: 2.0000 | Freq: Two times a day (BID) | NASAL | Status: DC | PRN

## 2014-05-31 MED ORDER — SODIUM CHLORIDE 0.9 % IV SOLN
INTRAVENOUS | Status: AC
  Administered 2014-05-31: 06:00:00 via INTRAVENOUS

## 2014-05-31 MED ORDER — METRONIDAZOLE 500 MG PO TABS
500.0000 mg | ORAL_TABLET | Freq: Three times a day (TID) | ORAL | Status: DC
  Administered 2014-05-31 – 2014-06-01 (×4): 500 mg via ORAL
  Filled 2014-05-31 (×7): qty 1

## 2014-05-31 MED ORDER — PANTOPRAZOLE SODIUM 40 MG PO TBEC
40.0000 mg | DELAYED_RELEASE_TABLET | Freq: Every day | ORAL | Status: DC
  Administered 2014-05-31 – 2014-06-01 (×2): 40 mg via ORAL
  Filled 2014-05-31 (×2): qty 1

## 2014-05-31 MED ORDER — LOPERAMIDE HCL 2 MG PO CAPS
2.0000 mg | ORAL_CAPSULE | Freq: Three times a day (TID) | ORAL | Status: DC | PRN
  Administered 2014-05-31: 2 mg via ORAL
  Filled 2014-05-31: qty 1

## 2014-05-31 MED ORDER — PNEUMOCOCCAL VAC POLYVALENT 25 MCG/0.5ML IJ INJ
0.5000 mL | INJECTION | INTRAMUSCULAR | Status: AC
  Administered 2014-06-01: 0.5 mL via INTRAMUSCULAR
  Filled 2014-05-31 (×2): qty 0.5

## 2014-05-31 MED ORDER — HYDROCODONE-ACETAMINOPHEN 7.5-325 MG PO TABS
1.0000 | ORAL_TABLET | Freq: Four times a day (QID) | ORAL | Status: DC | PRN
  Administered 2014-05-31 – 2014-06-01 (×4): 1 via ORAL
  Filled 2014-05-31 (×4): qty 1

## 2014-05-31 MED ORDER — SODIUM CHLORIDE 0.9 % IJ SOLN: 3.0000 mL | Freq: Two times a day (BID) | INTRAMUSCULAR | Status: DC

## 2014-05-31 MED ORDER — SODIUM CHLORIDE 0.9 % IV SOLN: 250.0000 mL | INTRAVENOUS | Status: DC | PRN

## 2014-05-31 MED ORDER — DUTASTERIDE-TAMSULOSIN HCL 0.5-0.4 MG PO CAPS: 1.0000 | ORAL_CAPSULE | Freq: Every day | ORAL | Status: DC

## 2014-05-31 MED ORDER — ALBUTEROL SULFATE (2.5 MG/3ML) 0.083% IN NEBU: 2.5000 mg | INHALATION_SOLUTION | RESPIRATORY_TRACT | Status: DC | PRN

## 2014-05-31 MED ORDER — VANCOMYCIN HCL IN DEXTROSE 1-5 GM/200ML-% IV SOLN
1000.0000 mg | Freq: Once | INTRAVENOUS | Status: AC
  Administered 2014-05-31: 1000 mg via INTRAVENOUS
  Filled 2014-05-31: qty 200

## 2014-05-31 MED ORDER — HEPARIN SODIUM (PORCINE) 5000 UNIT/ML IJ SOLN
5000.0000 [IU] | Freq: Three times a day (TID) | INTRAMUSCULAR | Status: DC
  Administered 2014-05-31 – 2014-06-01 (×4): 5000 [IU] via SUBCUTANEOUS
  Filled 2014-05-31 (×7): qty 1

## 2014-05-31 MED ORDER — MONTELUKAST SODIUM 10 MG PO TABS
10.0000 mg | ORAL_TABLET | Freq: Every day | ORAL | Status: DC | PRN
  Filled 2014-05-31: qty 1

## 2014-05-31 MED ORDER — VANCOMYCIN HCL IN DEXTROSE 1-5 GM/200ML-% IV SOLN
1000.0000 mg | Freq: Three times a day (TID) | INTRAVENOUS | Status: DC
  Administered 2014-05-31 – 2014-06-01 (×4): 1000 mg via INTRAVENOUS
  Filled 2014-05-31 (×6): qty 200

## 2014-05-31 MED ORDER — DUTASTERIDE 0.5 MG PO CAPS
0.5000 mg | ORAL_CAPSULE | Freq: Every day | ORAL | Status: DC
  Administered 2014-06-01: 0.5 mg via ORAL
  Filled 2014-05-31 (×2): qty 1

## 2014-05-31 MED ORDER — SODIUM CHLORIDE 0.9 % IJ SOLN: 3.0000 mL | INTRAMUSCULAR | Status: DC | PRN

## 2014-05-31 MED ORDER — SODIUM CHLORIDE 0.9 % IV SOLN
INTRAVENOUS | Status: DC
  Administered 2014-05-31: 04:00:00 via INTRAVENOUS

## 2014-05-31 MED ORDER — IBUPROFEN 800 MG PO TABS
800.0000 mg | ORAL_TABLET | Freq: Three times a day (TID) | ORAL | Status: DC | PRN
  Administered 2014-05-31 (×2): 800 mg via ORAL
  Filled 2014-05-31 (×3): qty 1

## 2014-05-31 MED ORDER — TAMSULOSIN HCL 0.4 MG PO CAPS
0.4000 mg | ORAL_CAPSULE | Freq: Every day | ORAL | Status: DC
  Administered 2014-05-31 – 2014-06-01 (×2): 0.4 mg via ORAL
  Filled 2014-05-31 (×2): qty 1

## 2014-05-31 MED ORDER — LISDEXAMFETAMINE DIMESYLATE 70 MG PO CAPS
70.0000 mg | ORAL_CAPSULE | Freq: Every day | ORAL | Status: DC
  Administered 2014-06-01: 70 mg via ORAL
  Filled 2014-05-31: qty 1

## 2014-05-31 MED ORDER — DIVALPROEX SODIUM ER 500 MG PO TB24
1500.0000 mg | ORAL_TABLET | Freq: Every day | ORAL | Status: DC
  Administered 2014-05-31: 1500 mg via ORAL
  Filled 2014-05-31 (×2): qty 3

## 2014-05-31 MED ORDER — LEVOFLOXACIN 500 MG PO TABS
500.0000 mg | ORAL_TABLET | ORAL | Status: DC
  Administered 2014-05-31: 500 mg via ORAL
  Filled 2014-05-31 (×2): qty 1

## 2014-05-31 NOTE — H&P (Addendum)
[facility] History and Physical  Philip Gates ATF:[id number] DOB: [birth year] DOA: 05/31/2014  Referring physician: ED physician PCP: Ala Bent, MD  Specialists:   Chief Complaint: Facial pain.  HPI: Philip Gates is a 58 y.o. male with past medical history of asthma, bipolar, GERD who presents with right-sided face pain.  Patient reports that he had a dental procedure 3 weeks ago. He has been taking clindamycin in the past 3 weeks. Last dose was yesterday. He has mild facial swelling which has been going on for nearly 3 weeks. It has been worsening in the past week, much worse since yesterday. He has tenderness over right side of his nose and right maxilla area. Patient does not have fever, chills. He has a liitle postnasal drip. Patient denies other symptoms, such as cough, chest pain, shortness of breath, nausea, vomiting, abdominal pain, diarrhea, rashes.   CT scan showed cellulitis involving the right aspect of the nose and adjacent soft tissues overlying the maxilla. He was found to have leukocytosis with a WBC 16. IV vancomycin and IVF were started in ED. He is admitted to inpatient for observation.   Review of Systems: As presented in the history of presenting illness, rest negative.  Where does patient live?  Lives with his wife at home Can patient participate in ADLs? Yes  Allergy:  Allergies  Allergen Reactions  . Shellfish Allergy Other (See Comments)    asthma  . Penicillins Rash    Past Medical History  Diagnosis Date  . Asthma     ACTIVITY INDUCED  . Sinus drainage   . Arthritis   . GERD (gastroesophageal reflux disease)   . Bipolar 1 disorder   . Neck pain, chronic     Past Surgical History  Procedure Laterality Date  . Nose surgery    . Total hip arthroplasty Left 01/03/2013    Procedure: TOTAL HIP ARTHROPLASTY ANTERIOR APPROACH;  Surgeon: Gearlean Alf, MD;  Location: [facility];  Service: Orthopedics;  Laterality: Left;    Social History:   reports that he quit smoking about 11 years ago. He does not have any smokeless tobacco history on file. He reports that he does not drink alcohol or use illicit drugs.  Family History:  Family History  Problem Relation Age of Onset  . Arrhythmia Mother     Has pacemaker  . Colon cancer Father      Prior to Admission medications   Medication Sig Start Date End Date Taking? Authorizing Provider  dexlansoprazole (DEXILANT) 60 MG capsule Take 60 mg by mouth daily.   Yes Historical Provider, MD  divalproex (DEPAKOTE ER) 500 MG 24 hr tablet Take 1,500 mg by mouth at bedtime.    Yes Historical Provider, MD  Dutasteride-Tamsulosin HCl (JALYN) 0.5-0.4 MG CAPS Take 1 tablet by mouth daily.   Yes Historical Provider, MD  HYDROcodone-acetaminophen (NORCO) 7.5-325 MG per tablet Take 1 tablet by mouth every 6 (six) hours as needed for moderate pain.   Yes Historical Provider, MD  ibuprofen (ADVIL,MOTRIN) 200 MG tablet Take 800 mg by mouth every 8 (eight) hours as needed for moderate pain.   Yes Historical Provider, MD  ipratropium (ATROVENT) 0.03 % nasal spray Place 2 sprays into the nose every 12 (twelve) hours as needed (allergies).   Yes Historical Provider, MD  LamoTRIgine (LAMICTAL PO) Take 1 tablet by mouth every evening.   Yes Historical Provider, MD  levalbuterol (XOPENEX HFA) 45 MCG/ACT inhaler Inhale 1-2 puffs into the lungs every 4 (  four) hours as needed for wheezing.   Yes Historical Provider, MD  lisdexamfetamine (VYVANSE) 70 MG capsule Take 70 mg by mouth daily.   Yes Historical Provider, MD  montelukast (SINGULAIR) 10 MG tablet Take 10 mg by mouth daily as needed (for allergies).   Yes Historical Provider, MD  oxymetazoline (AFRIN) 0.05 % nasal spray Place 1 spray into both nostrils 2 (two) times daily as needed for congestion.   Yes Historical Provider, MD  RABEprazole (ACIPHEX) 20 MG tablet Take 20 mg by mouth every evening.    Yes Historical Provider, MD  tadalafil (CIALIS) 20 MG tablet  Take 20 mg by mouth daily as needed for erectile dysfunction.   Yes Historical Provider, MD  testosterone cypionate (DEPOTESTOTERONE CYPIONATE) 100 MG/ML injection Inject 100 mg into the muscle every 14 (fourteen) days. For IM use only   Yes Historical Provider, MD    Physical Exam: Filed Vitals:   05/30/14 2001 05/30/14 2003 05/31/14 0100 05/31/14 0130  BP: 174/86  128/79 144/80  Pulse: 106   86  Temp:  98 F (36.7 C)    TempSrc:  Oral    Resp: 18  22 17   SpO2: 94%  96% 96%   General: Not in acute distress HEENT:       Eyes: PERRL, EOMI, no scleral icterus       ENT and Face:  right facial swelling, erythema and induration over mid maxillary region. mild right-sided periorbital edema. S/p of extraction of the right upper first premolar. No obvious intraoral swelling.        Neck: No JVD, no bruit, no mass felt. Cardiac: S1/S2, RRR, No murmurs, gallops or rubs Pulm: Good air movement bilaterally. Clear to auscultation bilaterally. No rales, wheezing, rhonchi or rubs. Abd: Soft, nondistended, nontender, no rebound pain, no organomegaly, BS present Ext: No edema. 2+DP/PT pulse bilaterally Musculoskeletal: No joint deformities, erythema, or stiffness, ROM full Skin: No rashes.  Neuro: Alert and oriented X3, cranial nerves II-XII grossly intact, muscle strength 5/5 in all extremeties, sensation to light touch intact.  Psych: Patient is not psychotic, no suicidal or hemocidal ideation.  Labs on Admission:  Basic Metabolic Panel:  Recent Labs Lab 05/30/14 2011  NA 139  K 4.3  CL 100  CO2 28  GLUCOSE 99  BUN 14  CREATININE 0.79  CALCIUM 9.8   Liver Function Tests:  Recent Labs Lab 05/30/14 2011  AST 17  ALT 21  ALKPHOS 54  BILITOT 0.4  PROT 7.2  ALBUMIN 3.7   No results found for this basename: LIPASE, AMYLASE,  in the last 168 hours No results found for this basename: AMMONIA,  in the last 168 hours CBC:  Recent Labs Lab 05/30/14 2011  WBC 16.0*  NEUTROABS  13.4*  HGB 14.3  HCT 41.6  MCV 89.5  PLT 342   Cardiac Enzymes: No results found for this basename: CKTOTAL, CKMB, CKMBINDEX, TROPONINI,  in the last 168 hours  BNP (last 3 results) No results found for this basename: PROBNP,  in the last 8760 hours CBG: No results found for this basename: GLUCAP,  in the last 168 hours  Radiological Exams on Admission: Ct Maxillofacial W/cm  05/30/2014   CLINICAL DATA:  Facial swelling, unknown source  EXAM: CT MAXILLOFACIAL WITH CONTRAST  TECHNIQUE: Multidetector CT imaging of the maxillofacial structures was performed with intravenous contrast. Multiplanar CT image reconstructions were also generated. A small metallic BB was placed on the right temple in order to reliably differentiate right  from left.  CONTRAST:  84mL OMNIPAQUE IOHEXOL 300 MG/ML  SOLN  COMPARISON:  None.  FINDINGS: Contents of the orbits are intact. No significant inflammatory change in the paranasal sinuses. Mandible and maxilla are both somewhat obscured by beam attenuation artifact from dental amalgam.  There is moderate soft tissue swelling involving the right side of the nose as well of soft tissues over the right parasagittal maxilla. There is some low attenuation in the soft tissues but no evidence of organized fluid collections.  There are numerous enlarged cervical lymph nodes right more so than left. A jugulodigastric node measures 10 mm in short axis. A submandibular node is also 10 mm. Major salivary glands are normal. No evidence of mucosal space lesion.  14 mm peripherally calcified structure in the region of the right cavernous sinus.  IMPRESSION: Evidence of cellulitis involving the right aspect of the nose and adjacent soft tissues overlying the maxilla. Likely reactive adenopathy.  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 Nonemergent CT arteriogram suggested.   Electronically Signed   By: Skipper Cliche M.D.   On:  05/30/2014 23:21   Assessment/Plan Principal Problem:   Facial cellulitis Active Problems:   Asthma   GERD (gastroesophageal reflux disease)   Bipolar 1 disorder   1. facial cellulitis:  His facial redness, tenderness and swelling plus findings on CT scan are consistent with facial cellulitis. It is likely secondary to the recent dental procedure. He failed oral clindamycin treatment.  Patient is not septic currently. Hemodynamically stable. Vancomycin IV was started in ED. It is better to cover anaerobes.   - Admit to MedSurg bed for observation - Continue IV vancomycin - Flagyl oral for anaerobe, - Blood culture X2 - IVF: NS 75 cc/h - pain control with Norco  2. Asthma: Stable, no signs of focal acute exacerbation -Continue home medications  3. Bipolar: Stable. No suicidal or homicidal ideation -Continue home medication  4. 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  - follow up as outpatient  DVT ppx: SQ Heparin   Code Status: Full code Family Communication: None at bed side.  Disposition Plan: Admit to inpatient  Ivor Costa [facility] Pager [phone]  If 7PM-7AM, please contact night-coverage [email or web address] Password [facility] 05/31/2014, 2:24 AM

## 2014-05-31 NOTE — Progress Notes (Signed)
ANTIBIOTIC CONSULT NOTE - INITIAL  Pharmacy Consult for Vancomycin  Indication: cellulitis  Allergies  Allergen Reactions  . Shellfish Allergy Other (See Comments)    asthma  . Penicillins Rash    Patient Measurements: Height: 5' 10.87" (180 cm) Weight: 199 lb (90.266 kg) IBW/kg (Calculated) : 74.99  Vital Signs: Temp: 97.8 F (36.6 C) (09/23 0324) Temp src: Oral (09/23 0324) BP: 142/86 mmHg (09/23 0430) Pulse Rate: 82 (09/23 0430) Intake/Output from previous day: 09/22 0701 - 09/23 0700 In: 1200 [IV Piggyback:1200] Out: -  Intake/Output from this shift: Total I/O In: 1200 [IV Piggyback:1200] Out: -   Labs:  Recent Labs  05/30/14 2011  WBC 16.0*  HGB 14.3  PLT 342  CREATININE 0.79   Estimated Creatinine Clearance: 118.3 ml/min (by C-G formula based on Cr of 0.79). No results found for this basename: VANCOTROUGH, VANCOPEAK, VANCORANDOM, GENTTROUGH, GENTPEAK, GENTRANDOM, TOBRATROUGH, TOBRAPEAK, TOBRARND, AMIKACINPEAK, AMIKACINTROU, AMIKACIN,  in the last 72 hours   Microbiology: No results found for this or any previous visit (from the past 720 hour(s)).  Medical History: Past Medical History  Diagnosis Date  . Asthma     ACTIVITY INDUCED  . Sinus drainage   . Arthritis   . GERD (gastroesophageal reflux disease)   . Bipolar 1 disorder   . Neck pain, chronic     Medications:  Dexilant  Depakote  Jalyn  Norco  Lamictal  Xopenex  Vyvanse  Singulair  Aciphex  Cialis  Testosterone    Assessment: 58 yo male with facial cellulitis for empiric antibiotics.  Vancomycin 1 g IV given in ED at  0100  Goal of Therapy:  Vancomycin trough level 10-15 mcg/ml  Plan:  Vancomycin 1 g IV q8h  Caryl Pina 05/31/2014,5:02 AM

## 2014-05-31 NOTE — Progress Notes (Signed)
UR completed 

## 2014-05-31 NOTE — ED Provider Notes (Signed)
CSN: [id number]     Arrival date & time 05/30/14  1955 History   First MD Initiated Contact with Patient 05/31/14 0015     Chief Complaint  Patient presents with  . Facial Swelling     (Consider location/radiation/quality/duration/timing/severity/associated sxs/prior Treatment) HPI Patient says 3 weeks of right-sided facial swelling after having tooth extraction. He's been on clindamycin the skull time. He states that he came acutely worse over the last day. This swelling to the right side of his nose right maxilla area. He denies any fever or chills. Denies any shortness of breath. Past Medical History  Diagnosis Date  . Asthma     ACTIVITY INDUCED  . Sinus drainage   . Arthritis   . GERD (gastroesophageal reflux disease)   . Bipolar 1 disorder   . Neck pain, chronic    Past Surgical History  Procedure Laterality Date  . Nose surgery    . Total hip arthroplasty Left 01/03/2013    Procedure: TOTAL HIP ARTHROPLASTY ANTERIOR APPROACH;  Surgeon: Gearlean Alf, MD;  Location: [facility];  Service: Orthopedics;  Laterality: Left;   History reviewed. No pertinent family history. History  Substance Use Topics  . Smoking status: Former Smoker    Quit date: 12/28/2002  . Smokeless tobacco: Not on file  . Alcohol Use: No    Review of Systems  Constitutional: Negative for fever and chills.  HENT: Positive for facial swelling. Negative for sinus pressure, sore throat and trouble swallowing.   Respiratory: Negative for shortness of breath.   Cardiovascular: Negative for chest pain.  Gastrointestinal: Negative for nausea, vomiting and abdominal pain.  Musculoskeletal: Negative for neck pain and neck stiffness.  Skin: Positive for color change and rash.  Neurological: Negative for dizziness, weakness, light-headedness, numbness and headaches.  All other systems reviewed and are negative.     Allergies  Shellfish allergy and Penicillins  Home Medications   Prior to Admission  medications   Medication Sig Start Date End Date Taking? Authorizing Provider  dexlansoprazole (DEXILANT) 60 MG capsule Take 60 mg by mouth daily.   Yes Historical Provider, MD  divalproex (DEPAKOTE ER) 500 MG 24 hr tablet Take 1,500 mg by mouth at bedtime.    Yes Historical Provider, MD  Dutasteride-Tamsulosin HCl (JALYN) 0.5-0.4 MG CAPS Take 1 tablet by mouth daily.   Yes Historical Provider, MD  HYDROcodone-acetaminophen (NORCO) 7.5-325 MG per tablet Take 1 tablet by mouth every 6 (six) hours as needed for moderate pain.   Yes Historical Provider, MD  ibuprofen (ADVIL,MOTRIN) 200 MG tablet Take 800 mg by mouth every 8 (eight) hours as needed for moderate pain.   Yes Historical Provider, MD  ipratropium (ATROVENT) 0.03 % nasal spray Place 2 sprays into the nose every 12 (twelve) hours as needed (allergies).   Yes Historical Provider, MD  LamoTRIgine (LAMICTAL PO) Take 1 tablet by mouth every evening.   Yes Historical Provider, MD  levalbuterol [facility] HFA) 45 MCG/ACT inhaler Inhale 1-2 puffs into the lungs every 4 (four) hours as needed for wheezing.   Yes Historical Provider, MD  lisdexamfetamine (VYVANSE) 70 MG capsule Take 70 mg by mouth daily.   Yes Historical Provider, MD  montelukast (SINGULAIR) 10 MG tablet Take 10 mg by mouth daily as needed (for allergies).   Yes Historical Provider, MD  oxymetazoline (AFRIN) 0.05 % nasal spray Place 1 spray into both nostrils 2 (two) times daily as needed for congestion.   Yes Historical Provider, MD  RABEprazole (ACIPHEX) 20 MG  tablet Take 20 mg by mouth every evening.    Yes Historical Provider, MD  tadalafil (CIALIS) 20 MG tablet Take 20 mg by mouth daily as needed for erectile dysfunction.   Yes Historical Provider, MD  testosterone cypionate (DEPOTESTOTERONE CYPIONATE) 100 MG/ML injection Inject 100 mg into the muscle every 14 (fourteen) days. For IM use only   Yes Historical Provider, MD   BP 174/86  Pulse 106  Temp(Src) 98 F (36.7 C) (Oral)   Resp 18  SpO2 94% Physical Exam  Nursing note and vitals reviewed. Constitutional: He is oriented to person, place, and time. He appears well-developed and well-nourished. No distress.  HENT:  Head: Normocephalic and atraumatic.  Mouth/Throat: Oropharynx is clear and moist.  Patient rhythm right nasal swelling erythema and induration that extends to the mid maxillary region on the right. He has mild right-sided periorbital edema. Patient also has erythematous and swollen right nasal mucosa. Tenderness to palpation. Oral exam with extraction of the right upper first premolar. No obvious intraoral swelling.  Eyes: EOM are normal. Pupils are equal, round, and reactive to light.  Extraocular movement is intact without any pain   Neck: Normal range of motion. Neck supple. No tracheal deviation present.  Cardiovascular: Normal rate and regular rhythm.   Pulmonary/Chest: Effort normal and breath sounds normal. No stridor. No respiratory distress. He has no wheezes. He has no rales.  Abdominal: Soft. Bowel sounds are normal.  Musculoskeletal: Normal range of motion. He exhibits no edema and no tenderness.  Lymphadenopathy:    He has no cervical adenopathy.  Neurological: He is alert and oriented to person, place, and time.  Moves all extremities without deficit. Sensation grossly intact.  Skin: Skin is warm and dry. No rash noted. There is erythema.  See HEENT exam  Psychiatric: He has a normal mood and affect. His behavior is normal.    ED Course  Procedures (including critical care time) Labs Review Labs Reviewed  CBC WITH DIFFERENTIAL - Abnormal; Notable for the following:    WBC 16.0 (*)    Neutrophils Relative % 83 (*)    Neutro Abs 13.4 (*)    Lymphocytes Relative 10 (*)    Monocytes Absolute 1.1 (*)    All other components within normal limits  COMPREHENSIVE METABOLIC PANEL    Imaging Review Ct Maxillofacial W/cm  05/30/2014   CLINICAL DATA:  Facial swelling, unknown source   EXAM: CT MAXILLOFACIAL WITH CONTRAST  TECHNIQUE: Multidetector CT imaging of the maxillofacial structures was performed with intravenous contrast. Multiplanar CT image reconstructions were also generated. A small metallic BB was placed on the right temple in order to reliably differentiate right from left.  CONTRAST:  37mL OMNIPAQUE IOHEXOL 300 MG/ML  SOLN  COMPARISON:  None.  FINDINGS: Contents of the orbits are intact. No significant inflammatory change in the paranasal sinuses. Mandible and maxilla are both somewhat obscured by beam attenuation artifact from dental amalgam.  There is moderate soft tissue swelling involving the right side of the nose as well of soft tissues over the right parasagittal maxilla. There is some low attenuation in the soft tissues but no evidence of organized fluid collections.  There are numerous enlarged cervical lymph nodes right more so than left. A jugulodigastric node measures 10 mm in short axis. A submandibular node is also 10 mm. Major salivary glands are normal. No evidence of mucosal space lesion.  14 mm peripherally calcified structure in the region of the right cavernous sinus.  IMPRESSION: Evidence of  cellulitis involving the right aspect of the nose and adjacent soft tissues overlying the maxilla. Likely reactive adenopathy.  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 Nonemergent CT arteriogram suggested.   Electronically Signed   By: Skipper Cliche M.D.   On: 05/30/2014 23:21     EKG Interpretation None      MDM   Final diagnoses:  Facial cellulitis    Will start on IV vancomycin. Will be admission for IV antibiotics given failure of PO antibiotics.  Discussed with Dr. Blaine Hamper at 1:30 a.m. when the patient to MedSurg bed. Temporary orders placed.  Julianne Rice, MD 05/31/14 [phone]

## 2014-05-31 NOTE — Progress Notes (Signed)
Patient was admitted earlier today. H&P reviewed.  Patient states the swelling is improving. Pain is improved as well.  VSS  Labs reviewed.  Facial cellulitis involving Nose and right face over maxillary area. On IV vanc and flagyl. Improving. Continue to monitor. Discussed in detail with patient.  Darina Hartwell 5:28 PM

## 2014-06-01 LAB — CBC
HCT: 40.7 % (ref 39.0–52.0)
Hemoglobin: 13.6 g/dL (ref 13.0–17.0)
MCH: 29.8 pg (ref 26.0–34.0)
MCHC: 33.4 g/dL (ref 30.0–36.0)
MCV: 89.3 fL (ref 78.0–100.0)
PLATELETS: 336 10*3/uL (ref 150–400)
RBC: 4.56 MIL/uL (ref 4.22–5.81)
RDW: 15.2 % (ref 11.5–15.5)
WBC: 6.5 10*3/uL (ref 4.0–10.5)

## 2014-06-01 LAB — BASIC METABOLIC PANEL
ANION GAP: 13 (ref 5–15)
BUN: 7 mg/dL (ref 6–23)
CO2: 27 meq/L (ref 19–32)
Calcium: 9.8 mg/dL (ref 8.4–10.5)
Chloride: 100 mEq/L (ref 96–112)
Creatinine, Ser: 0.79 mg/dL (ref 0.50–1.35)
GFR calc Af Amer: >90 mL/min (ref >90)
Glucose, Bld: 148 mg/dL — ABNORMAL HIGH (ref 70–99)
POTASSIUM: 4.5 meq/L (ref 3.7–5.3)
SODIUM: 140 meq/L (ref 137–147)

## 2014-06-01 MED ORDER — LEVOFLOXACIN 500 MG PO TABS: 500.0000 mg | ORAL_TABLET | Freq: Every day | ORAL | Status: DC

## 2014-06-01 MED ORDER — DOXYCYCLINE HYCLATE 100 MG PO TABS: 100.0000 mg | ORAL_TABLET | Freq: Two times a day (BID) | ORAL | Status: DC

## 2014-06-01 MED ORDER — LOPERAMIDE HCL 2 MG PO CAPS: 2.0000 mg | ORAL_CAPSULE | Freq: Three times a day (TID) | ORAL | Status: DC | PRN

## 2014-06-01 MED ORDER — DOXYCYCLINE HYCLATE 100 MG PO TABS
100.0000 mg | ORAL_TABLET | Freq: Two times a day (BID) | ORAL | Status: DC
  Administered 2014-06-01: 100 mg via ORAL
  Filled 2014-06-01: qty 1

## 2014-06-01 MED ORDER — MAGIC MOUTHWASH: 5.0000 mL | Freq: Three times a day (TID) | ORAL | Status: DC | PRN

## 2014-06-01 NOTE — Discharge Instructions (Signed)
Do not mash on your nose or pick at it. Keep area clean and dry. Use mask if working in dusty areas.  Cellulitis Cellulitis is an infection of the skin and the tissue beneath it. The infected area is usually red and tender. Cellulitis occurs most often in the arms and lower legs.  CAUSES  Cellulitis is caused by bacteria that enter the skin through cracks or cuts in the skin. The most common types of bacteria that cause cellulitis are staphylococci and streptococci. SIGNS AND SYMPTOMS   Redness and warmth.  Swelling.  Tenderness or pain.  Fever. DIAGNOSIS  Your health care provider can usually determine what is wrong based on a physical exam. Blood tests may also be done. TREATMENT  Treatment usually involves taking an antibiotic medicine. HOME CARE INSTRUCTIONS   Take your antibiotic medicine as directed by your health care provider. Finish the antibiotic even if you start to feel better.  Keep the infected arm or leg elevated to reduce swelling.  Apply a warm cloth to the affected area up to 4 times per day to relieve pain.  Take medicines only as directed by your health care provider.  Keep all follow-up visits as directed by your health care provider. SEEK MEDICAL CARE IF:   You notice red streaks coming from the infected area.  Your red area gets larger or turns dark in color.  Your bone or joint underneath the infected area becomes painful after the skin has healed.  Your infection returns in the same area or another area.  You notice a swollen bump in the infected area.  You develop new symptoms.  You have a fever. SEEK IMMEDIATE MEDICAL CARE IF:   You feel very sleepy.  You develop vomiting or diarrhea.  You have a general ill feeling (malaise) with muscle aches and pains. MAKE SURE YOU:   Understand these instructions.  Will watch your condition.  Will get help right away if you are not doing well or get worse. Document Released: 06/04/2005 Document  Revised: 01/09/2014 Document Reviewed: 11/10/2011 [facility] Patient Information 2015 [place], [place]. This information is not intended to replace advice given to you by your health care provider. Make sure you discuss any questions you have with your health care provider.

## 2014-06-01 NOTE — Discharge Summary (Addendum)
[facility]  Physician Discharge Summary   Patient ID: Philip Gates MRN: [id number] DOB/AGE: [birth year] 58 y.o.  Admit date: 05/31/2014 Discharge date: 06/01/2014  PCP: Delphina Cahill, MD  DISCHARGE DIAGNOSES:  Principal Problem:   Facial cellulitis Active Problems:   Asthma   GERD (gastroesophageal reflux disease)   Bipolar 1 disorder   RECOMMENDATIONS FOR OUTPATIENT FOLLOW UP: 1. Needs follow up for incidental finding of 85mm calcified structure in right cavernous sinus. Consider CT arteriogram.   DISCHARGE CONDITION: fair  Diet recommendation: Regular  Filed Weights   05/31/14 0324 05/31/14 1312  Weight: 90.266 kg (199 lb) 91 kg (200 lb 9.9 oz)    INITIAL HISTORY: 58yo male presented with right sided face pain. He was noted to have cellulitis involving the nose and right face. He was admitted and started on antibiotics.  Consultations:  None  Procedures:  None  HOSPITAL COURSE:   Facial cellulitis:  He was started on IV Vanc and Flagyl. His face improved quickly. The redness started subsiding. It became less tender. He was switched over to Doxycycline and levaquin. He tolerated these antibiotics well. He had failed Clindamycin as outpatient. He reported dental abscess recently and underwent tooth extraction about 3 weeks ago. That could have precipitated the current process. Pain was controlled. Patient reports getting diarrhea with antibiotics and so he was given Imodium PRN.    History of Asthma Stable, no signs of focal acute exacerbation. Continue home medications   Bipolar Disorder Stable. No suicidal or homicidal ideation. Continue home medication   Incidental Finding on CT scan 14 mm round peripherally calcified structure in the right cavernous sinus possibly a carotid aneurysm. He did not have any neurological deficits. Would recommend close follow up and CT arteriogram as outpatient. Patient had received contrast for Ct maxillofacial here. SEE  ADDENDUM BELOW  Patient feels well and wants to go home. He was asked to avoid exertion for a few days abut no other limitations. Warm compresses to area. [place] for discharge.    PERTINENT LABS:  The results of significant diagnostics from this hospitalization (including imaging, microbiology, ancillary and laboratory) are listed below for reference.    Microbiology: Recent Results (from the past 240 hour(s))  CULTURE, BLOOD (ROUTINE X 2)     Status: None   Collection Time    05/31/14  5:09 AM      Result Value Ref Range Status   Specimen Description BLOOD RIGHT ANTECUBITAL   Final   Special Requests BOTTLES DRAWN AEROBIC ONLY 10CC   Final   Culture  Setup Time     Final   Value: 05/31/2014 08:38     Performed at [facility]   Culture     Final   Value:        BLOOD CULTURE RECEIVED NO GROWTH TO DATE CULTURE WILL BE HELD FOR 5 DAYS BEFORE ISSUING A FINAL NEGATIVE REPORT     Performed at [facility]   Report Status PENDING   Incomplete  CULTURE, BLOOD (ROUTINE X 2)     Status: None   Collection Time    05/31/14  5:20 AM      Result Value Ref Range Status   Specimen Description BLOOD HAND RIGHT   Final   Special Requests BOTTLES DRAWN AEROBIC ONLY 10CC   Final   Culture  Setup Time     Final   Value: 05/31/2014 08:38     Performed at [facility]  Final   Value:        BLOOD CULTURE RECEIVED NO GROWTH TO DATE CULTURE WILL BE HELD FOR 5 DAYS BEFORE ISSUING A FINAL NEGATIVE REPORT     Performed at [facility]   Report Status PENDING   Incomplete     Labs: Basic Metabolic Panel:  Recent Labs Lab 05/30/14 2011 05/31/14 0621 06/01/14 0555  NA 139 141 140  K 4.3 4.6 4.5  CL 100 104 100  CO2 28 28 27   GLUCOSE 99 92 148*  BUN 14 8 7   CREATININE 0.79 0.79 0.79  CALCIUM 9.8 8.8 9.8   Liver Function Tests:  Recent Labs Lab 05/30/14 2011 05/31/14 0621  AST 17 18  ALT 21 18  ALKPHOS 54 46  BILITOT 0.4 0.3  PROT 7.2 6.3   ALBUMIN 3.7 3.2*   CBC:  Recent Labs Lab 05/30/14 2011 05/31/14 0621 06/01/14 0555  WBC 16.0* 8.6 6.5  NEUTROABS 13.4*  --   --   HGB 14.3 13.4 13.6  HCT 41.6 39.9 40.7  MCV 89.5 90.5 89.3  PLT 342 294 336    IMAGING STUDIES Ct Maxillofacial W/cm  05/30/2014   CLINICAL DATA:  Facial swelling, unknown source  EXAM: CT MAXILLOFACIAL WITH CONTRAST  TECHNIQUE: Multidetector CT imaging of the maxillofacial structures was performed with intravenous contrast. Multiplanar CT image reconstructions were also generated. A small metallic BB was placed on the right temple in order to reliably differentiate right from left.  CONTRAST:  60mL OMNIPAQUE IOHEXOL 300 MG/ML  SOLN  COMPARISON:  None.  FINDINGS: Contents of the orbits are intact. No significant inflammatory change in the paranasal sinuses. Mandible and maxilla are both somewhat obscured by beam attenuation artifact from dental amalgam.  There is moderate soft tissue swelling involving the right side of the nose as well of soft tissues over the right parasagittal maxilla. There is some low attenuation in the soft tissues but no evidence of organized fluid collections.  There are numerous enlarged cervical lymph nodes right more so than left. A jugulodigastric node measures 10 mm in short axis. A submandibular node is also 10 mm. Major salivary glands are normal. No evidence of mucosal space lesion.  14 mm peripherally calcified structure in the region of the right cavernous sinus.  IMPRESSION: Evidence of cellulitis involving the right aspect of the nose and adjacent soft tissues overlying the maxilla. Likely reactive adenopathy.  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 Nonemergent CT arteriogram suggested.   Electronically Signed   By: Skipper Cliche M.D.   On: 05/30/2014 23:21    DISCHARGE EXAMINATION: Filed Vitals:   05/31/14 1312 05/31/14 1811 05/31/14 2155 06/01/14 0517  BP:  135/70 148/86 148/89 138/92  Pulse: 116 89 78 72  Temp: 98.1 F (36.7 C) 97.9 F (36.6 C) 98.1 F (36.7 C) 97.4 F (36.3 C)  TempSrc: Oral Oral Oral   Resp: 18 17 17 18   Height: 6' (1.829 m)     Weight: 91 kg (200 lb 9.9 oz)     SpO2: 97% 98% 100% 96%   General appearance: alert, cooperative, appears stated age and no distress Resp: clear to auscultation bilaterally Cardio: regular rate and rhythm, S1, S2 normal, no murmur, click, rub or gallop GI: soft, non-tender; bowel sounds normal; no masses,  no organomegaly  DISPOSITION: Home  Discharge Instructions   Diet general    Complete by:  As directed  Increase activity slowly    Complete by:  As directed            ALLERGIES:  Allergies  Allergen Reactions  . Shellfish Allergy Other (See Comments)    asthma  . Penicillins Rash    Discharge Medication List as of 06/01/2014 11:27 AM    START taking these medications   Details  Alum & Mag Hydroxide-Simeth (MAGIC MOUTHWASH) SOLN Take 5 mLs by mouth 3 (three) times daily as needed for mouth pain., Starting 06/01/2014, Until Discontinued, Print    doxycycline (VIBRA-TABS) 100 MG tablet Take 1 tablet (100 mg total) by mouth every 12 (twelve) hours. For 12 days, Starting 06/01/2014, Until Discontinued, Print    levofloxacin (LEVAQUIN) 500 MG tablet Take 1 tablet (500 mg total) by mouth daily. For 12 days, Starting 06/01/2014, Until Discontinued, Print    loperamide (IMODIUM) 2 MG capsule Take 1 capsule (2 mg total) by mouth 3 (three) times daily as needed for diarrhea or loose stools., Starting 06/01/2014, Until Discontinued, Print      CONTINUE these medications which have NOT CHANGED   Details  dexlansoprazole (DEXILANT) 60 MG capsule Take 60 mg by mouth daily., Until Discontinued, Historical Med    divalproex (DEPAKOTE ER) 500 MG 24 hr tablet Take 1,500 mg by mouth at bedtime. , Until Discontinued, Historical Med    Dutasteride-Tamsulosin HCl (JALYN) 0.5-0.4 MG CAPS  Take 1 tablet by mouth daily., Until Discontinued, Historical Med    HYDROcodone-acetaminophen ([place]) 7.5-325 MG per tablet Take 1 tablet by mouth every 6 (six) hours as needed for moderate pain., Until Discontinued, Historical Med    ibuprofen (ADVIL,MOTRIN) 200 MG tablet Take 800 mg by mouth every 8 (eight) hours as needed for moderate pain., Until Discontinued, Historical Med    ipratropium (ATROVENT) 0.03 % nasal spray Place 2 sprays into the nose every 12 (twelve) hours as needed (allergies)., Until Discontinued, Historical Med    lamoTRIgine (LAMICTAL) 200 MG tablet Take 200 mg by mouth at bedtime., Until Discontinued, Historical Med    levalbuterol (XOPENEX HFA) 45 MCG/ACT inhaler Inhale 1-2 puffs into the lungs every 4 (four) hours as needed for wheezing., Until Discontinued, Historical Med    lisdexamfetamine (VYVANSE) 70 MG capsule Take 70 mg by mouth daily., Until Discontinued, Historical Med    montelukast (SINGULAIR) 10 MG tablet Take 10 mg by mouth daily as needed (for allergies)., Until Discontinued, Historical Med    oxymetazoline (AFRIN) 0.05 % nasal spray Place 1 spray into both nostrils 2 (two) times daily as needed for congestion., Until Discontinued, Historical Med    RABEprazole (ACIPHEX) 20 MG tablet Take 20 mg by mouth every evening. , Until Discontinued, Historical Med    tadalafil (CIALIS) 20 MG tablet Take 20 mg by mouth daily as needed for erectile dysfunction., Until Discontinued, Historical Med    testosterone cypionate (DEPOTESTOTERONE CYPIONATE) 100 MG/ML injection Inject 100 mg into the muscle every 14 (fourteen) days. For IM use only, Until Discontinued, Historical Med       Follow-up Information   Follow up with Delphina Cahill, MD. Schedule an appointment as soon as possible for a visit in 4 days. (post hospitalization follow up)    Specialty:  Internal Medicine   Contact information:    [place] [address] [phone]       TOTAL  DISCHARGE TIME: 35 mins  [facility] Pager [phone]  06/01/2014, 1:35 PM  ADDENDUM  I called patient to state  importance of following up with his PCP for the cellulitis and the possible aneurysm. He informed me that he had a MRA for same a few months ago at [facility]. I pulled up care everywhere and was able to find the MRA report done at [facility], which is below.  MRA Head on 01/10/14 FINDINGS:  # Distal internal carotid arteries: There is an 11 x 8 x 10 mm partially thrombosed aneurysm arising from the cavernous segment of the RIGHT internal carotid artery. Aneurysm points laterally and posteriorly into the cavernous sinus. There is a long neck to the aneurysm. The rest of the RIGHT internal carotid artery and LEFT internal carotid artery are unremarkable. # Anterior cerebral arteries: No aneurysm, significant stenosis or occlusion. Anterior communicating artery is present. # Middle cerebral arteries: No aneurysm, significant stenosis or major vessel occlusion. # Posterior cerebral arteries: No aneurysm, significant stenosis or occlusion. There is a fetal type configuration to the RIGHT posterior cerebral artery. # Basilar artery: No aneurysm, significant stenosis or occlusion. # Distal vertebral arteries: The hypoplastic LEFT vertebral artery terminates into LEFT the posterior inferior cerebellar artery. No aneurysm, significant stenosis or occlusion.  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   Will notify Dr. [place] Crane as there is no urgent need for CT arteriogram. Will let him determine further management of same.  Antinette Keough 9:41 AM

## 2014-06-01 NOTE — Progress Notes (Signed)
Pt discharged home. Received all prescriptions. Discharge instructions reviewed. Pt. Verbalized understanding and all questions answered.  VSS. IV removed with site clean dry and intact.  Skin dry and intact with no evidence of breakdown.

## 2014-06-06 LAB — CULTURE, BLOOD (ROUTINE X 2)
Culture: NO GROWTH
Culture: NO GROWTH

## 2014-09-15 ENCOUNTER — Other Ambulatory Visit (HOSPITAL_COMMUNITY): Payer: Self-pay | Admitting: Internal Medicine

## 2014-09-15 DIAGNOSIS — I671 Cerebral aneurysm, nonruptured: Secondary | ICD-10-CM

## 2014-09-18 ENCOUNTER — Ambulatory Visit (HOSPITAL_COMMUNITY)
Admission: RE | Admit: 2014-09-18 | Discharge: 2014-09-18 | Disposition: A | Discharge status: Home / Self Care | Payer: 59 | Source: Ambulatory Visit | Attending: Internal Medicine | Admitting: Internal Medicine

## 2014-09-18 DIAGNOSIS — I771 Stricture of artery: Secondary | ICD-10-CM | POA: Insufficient documentation

## 2014-09-18 DIAGNOSIS — M4312 Spondylolisthesis, cervical region: Secondary | ICD-10-CM | POA: Insufficient documentation

## 2014-09-18 DIAGNOSIS — J3489 Other specified disorders of nose and nasal sinuses: Secondary | ICD-10-CM | POA: Diagnosis not present

## 2014-09-18 DIAGNOSIS — I7090 Unspecified atherosclerosis: Secondary | ICD-10-CM | POA: Insufficient documentation

## 2014-09-18 DIAGNOSIS — I720 Aneurysm of carotid artery: Secondary | ICD-10-CM | POA: Insufficient documentation

## 2014-09-18 DIAGNOSIS — M4682 Other specified inflammatory spondylopathies, cervical region: Secondary | ICD-10-CM | POA: Diagnosis not present

## 2014-09-18 DIAGNOSIS — I671 Cerebral aneurysm, nonruptured: Secondary | ICD-10-CM | POA: Diagnosis not present

## 2014-09-18 IMAGING — CT CT ANGIO NECK
1 of 9 series · 2 of 33 positions shown · IV contrast (Omnipaque 300)
Comparison: Face CT with contrast [DATE].

CLINICAL DATA: 58-year-old male with rim calcified right cavernous
region carotid aneurysm detected on face CT in [MW]. Initial
encounter.



[Series 9: cta head/neck pac · axial · 0.44mm/px · z∈[+129,+249]mm · 2 of 180 slices shown]
[im 60/180  soft-tissue]
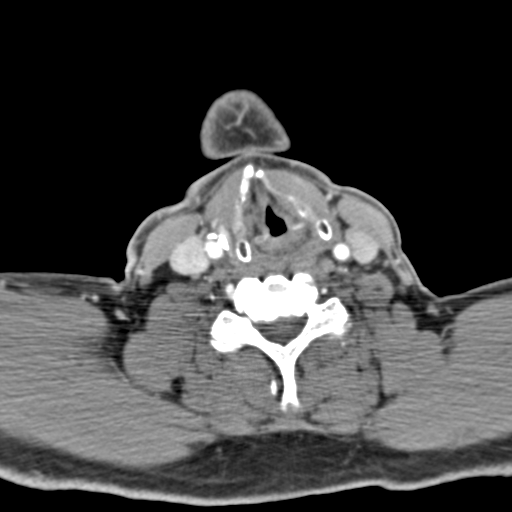
[im 120/180  bone]
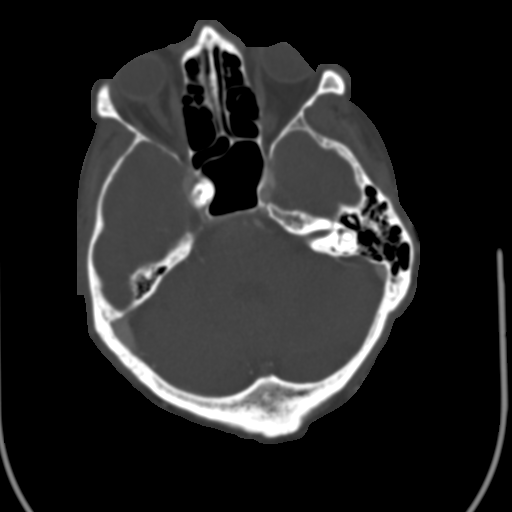

[2 of 33 positions shown; findings below may reference images not displayed]

FINDINGS: CT HEAD

Brain: Cerebral volume is within normal limits for age. No midline
shift or ventriculomegaly. No acute intracranial hemorrhage
identified. No evidence of cortically based acute infarction
identified. Gray-white matter differentiation is within normal
limits throughout the brain. No abnormal parenchymal enhancement.

Calvarium and skull base:  No acute osseous abnormality identified.

Paranasal sinuses: Mild paranasal sinus mucosal thickening. Mastoids
and tympanic cavities are clear.

Orbits: Visualized orbit soft tissues are within normal limits.

CTA NECK

Negative lung apices. No superior mediastinal lymphadenopathy.
Thyroid, larynx, pharynx, parapharyngeal spaces, retropharyngeal
space, sublingual space, submandibular glands, and parotid glands
are within normal limits. Multilevel degenerative changes in the
cervical spine with spondylolisthesis and bulky facet arthropathy at
several levels. No acute osseous abnormality identified. No cervical
lymphadenopathy.

Aortic arch: 3 vessel arch configuration. No great vessel origin
stenosis.

Right carotid system: Soft and calcified plaque at the right carotid
bifurcation, but no cervical carotid stenosis. Tortuous distal
cervical right ICA just below the skullbase.

Left carotid system: Soft plaque at the left carotid bifurcation, no
left cervical carotid stenosis.

Vertebral arteries:

Unusual origin of the right vertebral artery occurring as a
trifurcation at the branching of the a right brachiocephalic artery
(into the right CCA and right subclavian artery). Dominant right
vertebral artery with a late entry into the cervical transverse
foramen. No right vertebral artery stenosis in the neck.

No proximal left subclavian artery stenosis. Diminutive, non
dominant left vertebral artery is patent to the skullbase.

CTA HEAD

Anterior circulation: Negative left ICA siphon. Left ophthalmic and
posterior communicating artery origins are within normal limits.
Normal left carotid terminus. Normal left MCA and ACA origin.

Patent right ICA terminus, without atherosclerosis. In the cavernous
right ICA segment opposite the anterior genu there is a large rim
calcified chronic posteriorly directed ICA aneurysm encompassing 18
x 15 x 15 mm (AP by transverse by CC). The aneurysm is partially
thrombosis (the inferior and lateral aspect). The rim calcification
measures 1-2 mm, possibly up to 3 mm in some areas. This corresponds
to the lesion described on knee [MW] face CT.

In addition, there is a small superiorly directed 2 mm right ICA
para ophthalmic artery aneurysm best seen on series 606, image 75).

The more distal right posterior communicating artery origin is
within normal limits. The right ICA terminus than is normal. The
right MCA and ACA origins are within normal limits.

Anterior communicating artery and bilateral ACA branches are within
normal limits. Left MCA branches are within normal limits. Right MCA
branches are within normal limits.

Posterior circulation: Dominant distal right vertebral artery
primarily supplies the basilar. Non dominant distal left vertebral
artery functionally terminates in the left PICA. Dominant right
AICA. No distal vertebral stenosis. Mild distal basilar artery
irregularity, no basilar artery stenosis. SCA and left PCA origins
are within normal limits. Fetal type right PCA origin. Bilateral PCA
branches are within normal limits. Diminutive left posterior
communicating artery.

Venous sinuses: Within normal limits.

Anatomic variants: Fetal type right PCA origin.
IMPRESSION: 1. Large partially thrombosed and rim calcified right ICA siphon
aneurysm appears to arise from the cavernous segment proximal to the
anterior genu. It measures 18 x 15 x 15 mm, and appears stable
corresponding to the lesion described on the SHIB[facility] Face CT.
2. Very small 2-3 mm supraclinoid right ICA (paraophthalmic)
intracranial aneurysm.
3. Recommend Neurosurgery consultation regarding #1 and #2.
4. Otherwise negative intracranial circulation. Atherosclerosis in
the neck without hemodynamically significant stenosis.
5. Unusual origin of a dominant right vertebral artery; from a right
brachiocephalic artery trifurcation.
6.  No acute intracranial abnormality.

## 2014-09-18 MED ORDER — IOHEXOL 350 MG/ML SOLN
80.0000 mL | Freq: Once | INTRAVENOUS | Status: AC | PRN
Start: 2014-09-18 — End: 2014-09-18
  Administered 2014-09-18: 80 mL via INTRAVENOUS

## 2014-09-18 MED ORDER — IOHEXOL 300 MG/ML  SOLN: 80.0000 mL | Freq: Once | INTRAMUSCULAR | Status: AC | PRN

## 2014-09-19 ENCOUNTER — Ambulatory Visit: Payer: Self-pay | Admitting: Orthopedic Surgery

## 2014-09-19 NOTE — Progress Notes (Signed)
Preoperative surgical orders have been place into the [facility] hospital system for Philip Gates on 09/19/2014, 6:08 PM  by Mickel Crow for surgery on 10-06-2014.  Preop Total Knee orders including Experal, IV Tylenol, and IV Decadron as long as there are no contraindications to the above medications. [name], PA-C

## 2014-09-25 DIAGNOSIS — I671 Cerebral aneurysm, nonruptured: Secondary | ICD-10-CM | POA: Diagnosis present

## 2014-09-26 ENCOUNTER — Other Ambulatory Visit (HOSPITAL_COMMUNITY): Payer: Self-pay | Admitting: Neurosurgery

## 2014-09-26 DIAGNOSIS — I671 Cerebral aneurysm, nonruptured: Secondary | ICD-10-CM

## 2014-09-26 HISTORY — PX: OTHER SURGICAL HISTORY: SHX169

## 2014-09-29 NOTE — Patient Instructions (Addendum)
Philip Gates  09/29/2014   Your procedure is scheduled on: Friday 10/06/2014  Report to [facility] Main  Entrance and follow signs to               [place] at [place] AM.  Call this number if you have problems the morning of surgery [phone]   Remember:  Do not eat food or drink liquids :After Midnight.     Take these medicines the morning of surgery with A SIP OF WATER: Dexilant, use Atrovent nasal spray, Afrin nasal spray and Singulair if needed,Xopenex inhaler if needed and bring inhalers with you to hospital                               You may not have any metal on your body including hair pins and              piercings  Do not wear jewelry, make-up, lotions, powders or perfumes.             Do not wear nail polish.  Do not shave  48 hours prior to surgery.              Men may shave face and neck.   Do not bring valuables to the hospital. [place].  Contacts, dentures or bridgework may not be worn into surgery.  Leave suitcase in the car. After surgery it may be brought to your room.     Patients discharged the day of surgery will not be allowed to drive home.  Name and phone number of your driver:  Special Instructions: N/A              Please read over the following fact sheets you were given: _____________________________________________________________________             [facility] - Preparing for Surgery Before surgery, you can play an important role.  Because skin is not sterile, your skin needs to be as free of germs as possible.  You can reduce the number of germs on your skin by washing with CHG (chlorahexidine gluconate) soap before surgery.  CHG is an antiseptic cleaner which kills germs and bonds with the skin to continue killing germs even after washing. Please DO NOT use if you have an allergy to CHG or antibacterial soaps.  If your skin becomes reddened/irritated stop  using the CHG and inform your nurse when you arrive at Short Stay. Do not shave (including legs and underarms) for at least 48 hours prior to the first CHG shower.  You may shave your face/neck. Please follow these instructions carefully:  1.  Shower with CHG Soap the night before surgery and the  morning of Surgery.  2.  If you choose to wash your hair, wash your hair first as usual with your  normal  shampoo.  3.  After you shampoo, rinse your hair and body thoroughly to remove the  shampoo.                           4.  Use CHG as you would any other liquid soap.  You can apply chg directly  to the skin and wash  Gently with a scrungie or clean washcloth.  5.  Apply the CHG Soap to your body ONLY FROM THE NECK DOWN.   Do not use on face/ open                           Wound or open sores. Avoid contact with eyes, ears mouth and genitals (private parts).                       Wash face,  Genitals (private parts) with your normal soap.             6.  Wash thoroughly, paying special attention to the area where your surgery  will be performed.  7.  Thoroughly rinse your body with warm water from the neck down.  8.  DO NOT shower/wash with your normal soap after using and rinsing off  the CHG Soap.                9.  Pat yourself dry with a clean towel.            10.  Wear clean pajamas.            11.  Place clean sheets on your bed the night of your first shower and do not  sleep with pets. Day of Surgery : Do not apply any lotions/deodorants the morning of surgery.  Please wear clean clothes to the hospital/surgery center.  FAILURE TO FOLLOW THESE INSTRUCTIONS MAY RESULT IN THE CANCELLATION OF YOUR SURGERY PATIENT SIGNATURE_________________________________  NURSE SIGNATURE__________________________________  ________________________________________________________________________   Philip Gates  An incentive spirometer is a tool that can help keep your  lungs clear and active. This tool measures how well you are filling your lungs with each breath. Taking long deep breaths may help reverse or decrease the chance of developing breathing (pulmonary) problems (especially infection) following:  A long period of time when you are unable to move or be active. BEFORE THE PROCEDURE   If the spirometer includes an indicator to show your best effort, your nurse or respiratory therapist will set it to a desired goal.  If possible, sit up straight or lean slightly forward. Try not to slouch.  Hold the incentive spirometer in an upright position. INSTRUCTIONS FOR USE   Sit on the edge of your bed if possible, or sit up as far as you can in bed or on a chair.  Hold the incentive spirometer in an upright position.  Breathe out normally.  Place the mouthpiece in your mouth and seal your lips tightly around it.  Breathe in slowly and as deeply as possible, raising the piston or the ball toward the top of the column.  Hold your breath for 3-5 seconds or for as long as possible. Allow the piston or ball to fall to the bottom of the column.  Remove the mouthpiece from your mouth and breathe out normally.  Rest for a few seconds and repeat Steps 1 through 7 at least 10 times every 1-2 hours when you are awake. Take your time and take a few normal breaths between deep breaths.  The spirometer may include an indicator to show your best effort. Use the indicator as a goal to work toward during each repetition.  After each set of 10 deep breaths, practice coughing to be sure your lungs are clear. If you have an incision (the cut made at the time of surgery),  support your incision when coughing by placing a pillow or rolled up towels firmly against it. Once you are able to get out of bed, walk around indoors and cough well. You may stop using the incentive spirometer when instructed by your caregiver.  RISKS AND COMPLICATIONS  Take your time so you do not  get dizzy or light-headed.  If you are in pain, you may need to take or ask for pain medication before doing incentive spirometry. It is harder to take a deep breath if you are having pain. AFTER USE  Rest and breathe slowly and easily.  It can be helpful to keep track of a log of your progress. Your caregiver can provide you with a simple table to help with this. If you are using the spirometer at home, follow these instructions: [place] IF:   You are having difficultly using the spirometer.  You have trouble using the spirometer as often as instructed.  Your pain medication is not giving enough relief while using the spirometer.  You develop fever of 100.5 F (38.1 C) or higher. SEEK IMMEDIATE MEDICAL CARE IF:   You cough up bloody sputum that had not been present before.  You develop fever of 102 F (38.9 C) or greater.  You develop worsening pain at or near the incision site. MAKE SURE YOU:   Understand these instructions.  Will watch your condition.  Will get help right away if you are not doing well or get worse. Document Released: 01/05/2007 Document Revised: 11/17/2011 Document Reviewed: 03/08/2007 ExitCare Patient Information 2014 [place].   ________________________________________________________________________   Incentive Spirometer  An incentive spirometer is a tool that can help keep your lungs clear and active. This tool measures how well you are filling your lungs with each breath. Taking long deep breaths may help reverse or decrease the chance of developing breathing (pulmonary) problems (especially infection) following:  A long period of time when you are unable to move or be active. BEFORE THE PROCEDURE   If the spirometer includes an indicator to show your best effort, your nurse or respiratory therapist will set it to a desired goal.  If possible, sit up straight or lean slightly forward. Try not to slouch.  Hold the  incentive spirometer in an upright position. INSTRUCTIONS FOR USE   Sit on the edge of your bed if possible, or sit up as far as you can in bed or on a chair.  Hold the incentive spirometer in an upright position.  Breathe out normally.  Place the mouthpiece in your mouth and seal your lips tightly around it.  Breathe in slowly and as deeply as possible, raising the piston or the ball toward the top of the column.  Hold your breath for 3-5 seconds or for as long as possible. Allow the piston or ball to fall to the bottom of the column.  Remove the mouthpiece from your mouth and breathe out normally.  Rest for a few seconds and repeat Steps 1 through 7 at least 10 times every 1-2 hours when you are awake. Take your time and take a few normal breaths between deep breaths.  The spirometer may include an indicator to show your best effort. Use the indicator as a goal to work toward during each repetition.  After each set of 10 deep breaths, practice coughing to be sure your lungs are clear. If you have an incision (the cut made at the time of surgery), support your incision when coughing  by placing a pillow or rolled up towels firmly against it. Once you are able to get out of bed, walk around indoors and cough well. You may stop using the incentive spirometer when instructed by your caregiver.  RISKS AND COMPLICATIONS  Take your time so you do not get dizzy or light-headed.  If you are in pain, you may need to take or ask for pain medication before doing incentive spirometry. It is harder to take a deep breath if you are having pain. AFTER USE  Rest and breathe slowly and easily.  It can be helpful to keep track of a log of your progress. Your caregiver can provide you with a simple table to help with this. If you are using the spirometer at home, follow these instructions: [place] IF:   You are having difficultly using the spirometer.  You have trouble using the  spirometer as often as instructed.  Your pain medication is not giving enough relief while using the spirometer.  You develop fever of 100.5 F (38.1 C) or higher. SEEK IMMEDIATE MEDICAL CARE IF:   You cough up bloody sputum that had not been present before.  You develop fever of 102 F (38.9 C) or greater.  You develop worsening pain at or near the incision site. MAKE SURE YOU:   Understand these instructions.  Will watch your condition.  Will get help right away if you are not doing well or get worse. Document Released: 01/05/2007 Document Revised: 11/17/2011 Document Reviewed: 03/08/2007 ExitCare Patient Information 2014 Exit[employer], [place].   ________________________________________________________________________  WHAT IS A BLOOD TRANSFUSION? Blood Transfusion Information  A transfusion is the replacement of blood or some of its parts. Blood is made up of multiple cells which provide different functions.  Red blood cells carry oxygen and are used for blood loss replacement.  White blood cells fight against infection.  Platelets control bleeding.  Plasma helps clot blood.  Other blood products are available for specialized needs, such as hemophilia or other clotting disorders. BEFORE THE TRANSFUSION  Who gives blood for transfusions?   Healthy volunteers who are fully evaluated to make sure their blood is safe. This is blood bank blood. Transfusion therapy is the safest it has ever been in the practice of medicine. Before blood is taken from a donor, a complete history is taken to make sure that person has no history of diseases nor engages in risky social behavior (examples are intravenous drug use or sexual activity with multiple partners). The donor's travel history is screened to minimize risk of transmitting infections, such as malaria. The donated blood is tested for signs of infectious diseases, such as HIV and hepatitis. The blood is then tested to be sure it is  compatible with you in order to minimize the chance of a transfusion reaction. If you or a relative donates blood, this is often done in anticipation of surgery and is not appropriate for emergency situations. It takes many days to process the donated blood. RISKS AND COMPLICATIONS Although transfusion therapy is very safe and saves many lives, the main dangers of transfusion include:   Getting an infectious disease.  Developing a transfusion reaction. This is an allergic reaction to something in the blood you were given. Every precaution is taken to prevent this. The decision to have a blood transfusion has been considered carefully by your caregiver before blood is given. Blood is not given unless the benefits outweigh the risks. AFTER THE TRANSFUSION  Right after receiving a  blood transfusion, you will usually feel much better and more energetic. This is especially true if your red blood cells have gotten low (anemic). The transfusion raises the level of the red blood cells which carry oxygen, and this usually causes an energy increase.  The nurse administering the transfusion will monitor you carefully for complications. HOME CARE INSTRUCTIONS  No special instructions are needed after a transfusion. You may find your energy is better. Speak with your caregiver about any limitations on activity for underlying diseases you may have. SEEK MEDICAL CARE IF:   Your condition is not improving after your transfusion.  You develop redness or irritation at the intravenous (IV) site. SEEK IMMEDIATE MEDICAL CARE IF:  Any of the following symptoms occur over the next 12 hours:  Shaking chills.  You have a temperature by mouth above 102 F (38.9 C), not controlled by medicine.  Chest, back, or muscle pain.  People around you feel you are not acting correctly or are confused.  Shortness of breath or difficulty breathing.  Dizziness and fainting.  You get a rash or develop hives.  You have  a decrease in urine output.  Your urine turns a dark color or changes to pink, red, or brown. Any of the following symptoms occur over the next 10 days:  You have a temperature by mouth above 102 F (38.9 C), not controlled by medicine.  Shortness of breath.  Weakness after normal activity.  The white part of the eye turns yellow (jaundice).  You have a decrease in the amount of urine or are urinating less often.  Your urine turns a dark color or changes to pink, red, or brown. Document Released: 08/22/2000 Document Revised: 11/17/2011 Document Reviewed: 04/10/2008 [facility] Patient Information 2014 [place], [place].  _______________________________________________________________________

## 2014-10-02 ENCOUNTER — Ambulatory Visit (HOSPITAL_COMMUNITY)
Admission: RE | Admit: 2014-10-02 | Discharge: 2014-10-02 | Disposition: A | Discharge status: Home / Self Care | Payer: 59 | Source: Ambulatory Visit | Attending: Anesthesiology | Admitting: Anesthesiology

## 2014-10-02 ENCOUNTER — Encounter (HOSPITAL_COMMUNITY)
Admission: RE | Admit: 2014-10-02 | Discharge: 2014-10-02 | Disposition: A | Discharge status: Home / Self Care | Payer: 59 | Source: Ambulatory Visit | Attending: Orthopedic Surgery | Admitting: Orthopedic Surgery

## 2014-10-02 ENCOUNTER — Encounter (HOSPITAL_COMMUNITY): Payer: Self-pay

## 2014-10-02 DIAGNOSIS — I729 Aneurysm of unspecified site: Secondary | ICD-10-CM | POA: Insufficient documentation

## 2014-10-02 DIAGNOSIS — Z01818 Encounter for other preprocedural examination: Secondary | ICD-10-CM | POA: Diagnosis present

## 2014-10-02 HISTORY — DX: Aneurysm of unspecified site: I72.9

## 2014-10-02 HISTORY — DX: Cellulitis, unspecified: L03.90

## 2014-10-02 LAB — URINALYSIS, ROUTINE W REFLEX MICROSCOPIC
BILIRUBIN URINE: NEGATIVE
GLUCOSE, UA: NEGATIVE mg/dL
Hgb urine dipstick: NEGATIVE
Ketones, ur: NEGATIVE mg/dL
Leukocytes, UA: NEGATIVE
Nitrite: NEGATIVE
PH: 7.5 (ref 5.0–8.0)
Protein, ur: NEGATIVE mg/dL
Specific Gravity, Urine: 1.006 (ref 1.005–1.030)
UROBILINOGEN UA: 0.2 mg/dL (ref 0.0–1.0)

## 2014-10-02 LAB — PROTIME-INR
INR: 0.93 (ref 0.00–1.49)
PROTHROMBIN TIME: 12.6 s (ref 11.6–15.2)

## 2014-10-02 LAB — CBC
HCT: 44 % (ref 39.0–52.0)
Hemoglobin: 14.6 g/dL (ref 13.0–17.0)
MCH: 29.5 pg (ref 26.0–34.0)
MCHC: 33.2 g/dL (ref 30.0–36.0)
MCV: 88.9 fL (ref 78.0–100.0)
Platelets: 218 10*3/uL (ref 150–400)
RBC: 4.95 MIL/uL (ref 4.22–5.81)
RDW: 14.8 % (ref 11.5–15.5)
WBC: 4.6 10*3/uL (ref 4.0–10.5)

## 2014-10-02 LAB — COMPREHENSIVE METABOLIC PANEL
ALK PHOS: 55 U/L (ref 39–117)
ALT: 18 U/L (ref 0–53)
AST: 20 U/L (ref 0–37)
Albumin: 4.5 g/dL (ref 3.5–5.2)
Anion gap: 7 (ref 5–15)
CO2: 30 mmol/L (ref 19–32)
Calcium: 10 mg/dL (ref 8.4–10.5)
Chloride: 104 mmol/L (ref 96–112)
Creatinine, Ser: 0.91 mg/dL (ref 0.50–1.35)
GFR calc Af Amer: >90 mL/min (ref >90)
GLUCOSE: 95 mg/dL (ref 70–99)
POTASSIUM: 4.5 mmol/L (ref 3.5–5.1)
Sodium: 141 mmol/L (ref 135–145)
Total Bilirubin: 0.2 mg/dL — ABNORMAL LOW (ref 0.3–1.2)
Total Protein: 7.5 g/dL (ref 6.0–8.3)

## 2014-10-02 LAB — SURGICAL PCR SCREEN
MRSA, PCR: NEGATIVE
Staphylococcus aureus: NEGATIVE

## 2014-10-02 LAB — APTT: aPTT: 28 seconds (ref 24–37)

## 2014-10-02 IMAGING — CR DG CHEST 2V
2 series · 2 of 2 positions shown · non-contrast
Comparison: [DATE].

CLINICAL DATA: Initial encounter for preoperative assessment for
aneurysm surgery.

EXAM:
CHEST  2 VIEW

[w chest pa *]
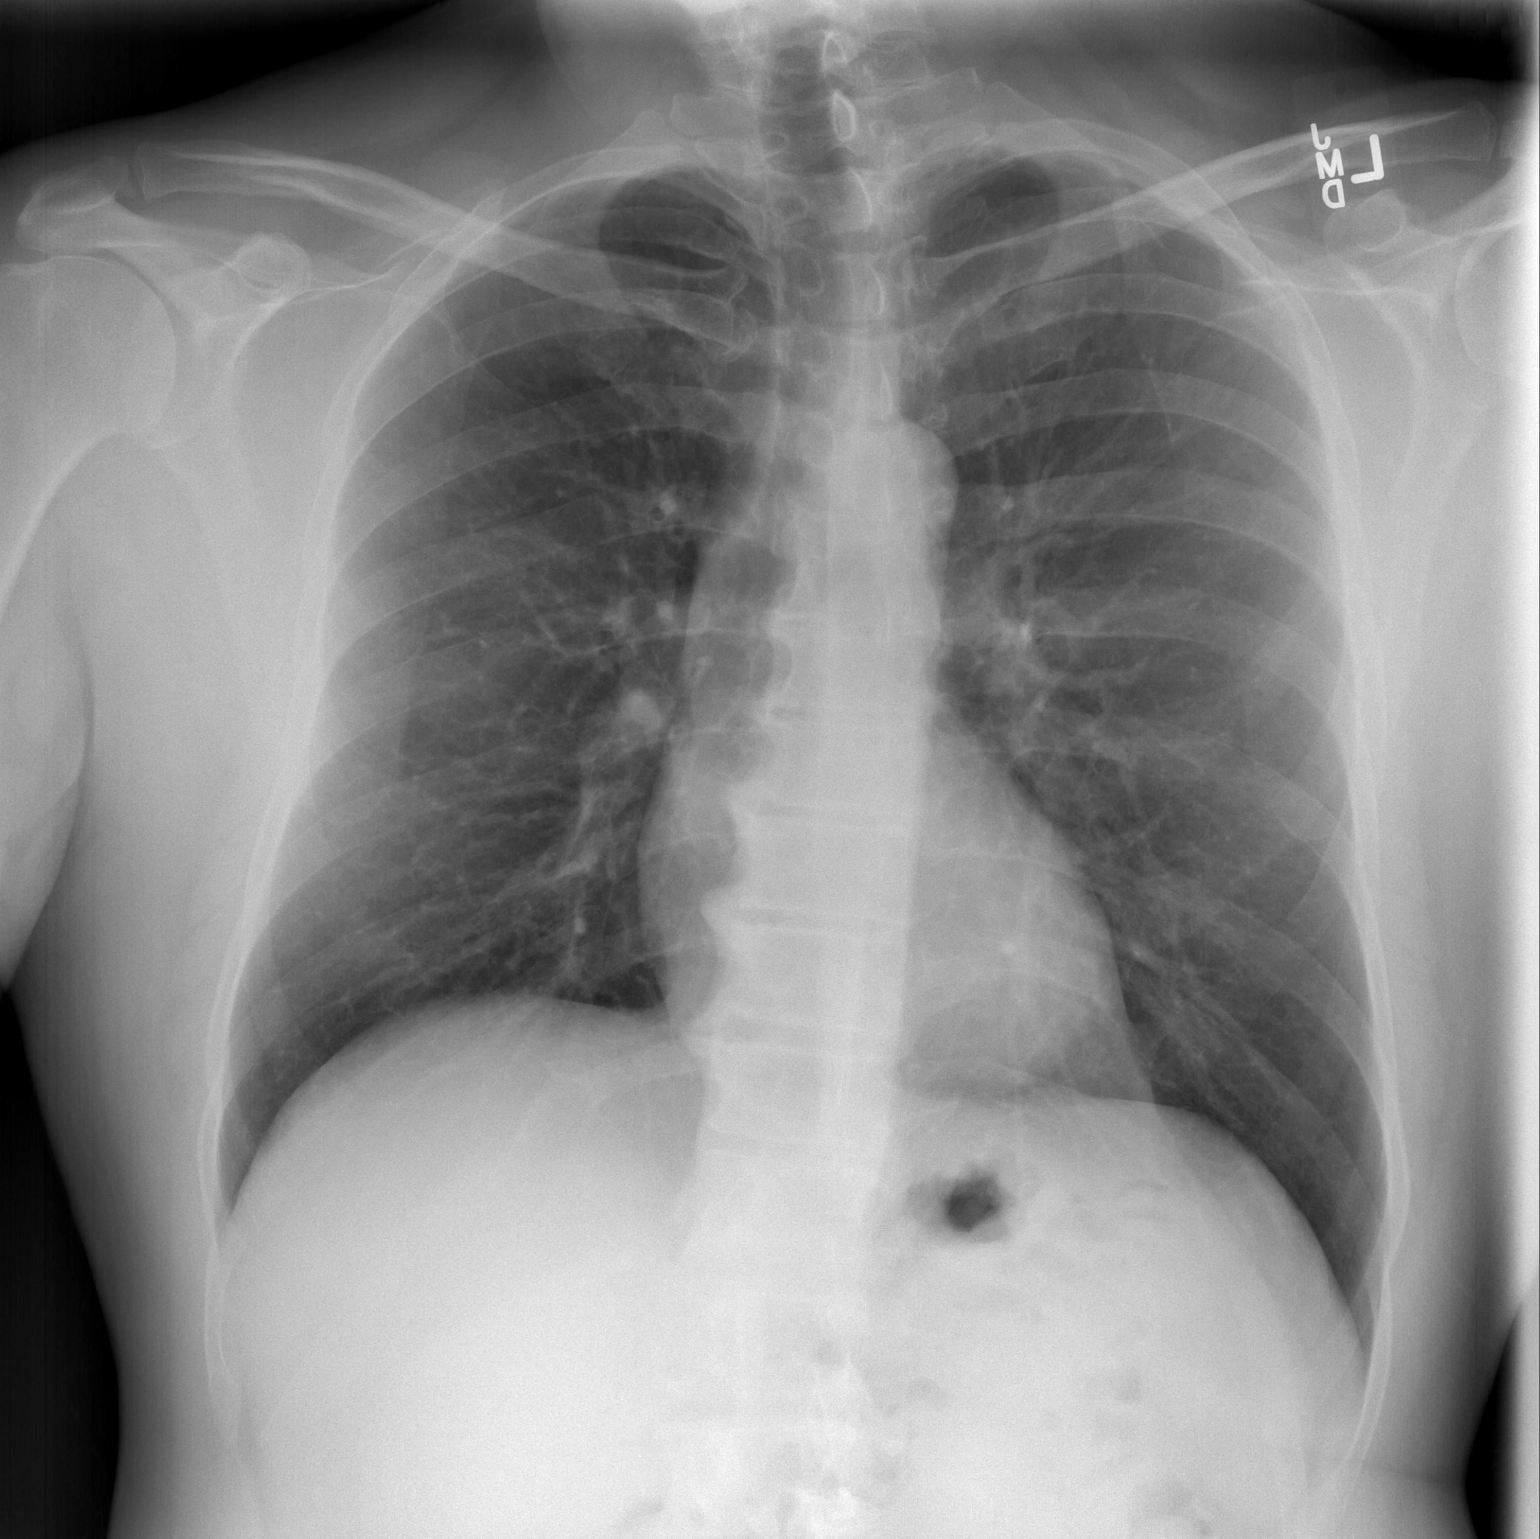

[w chest lat]
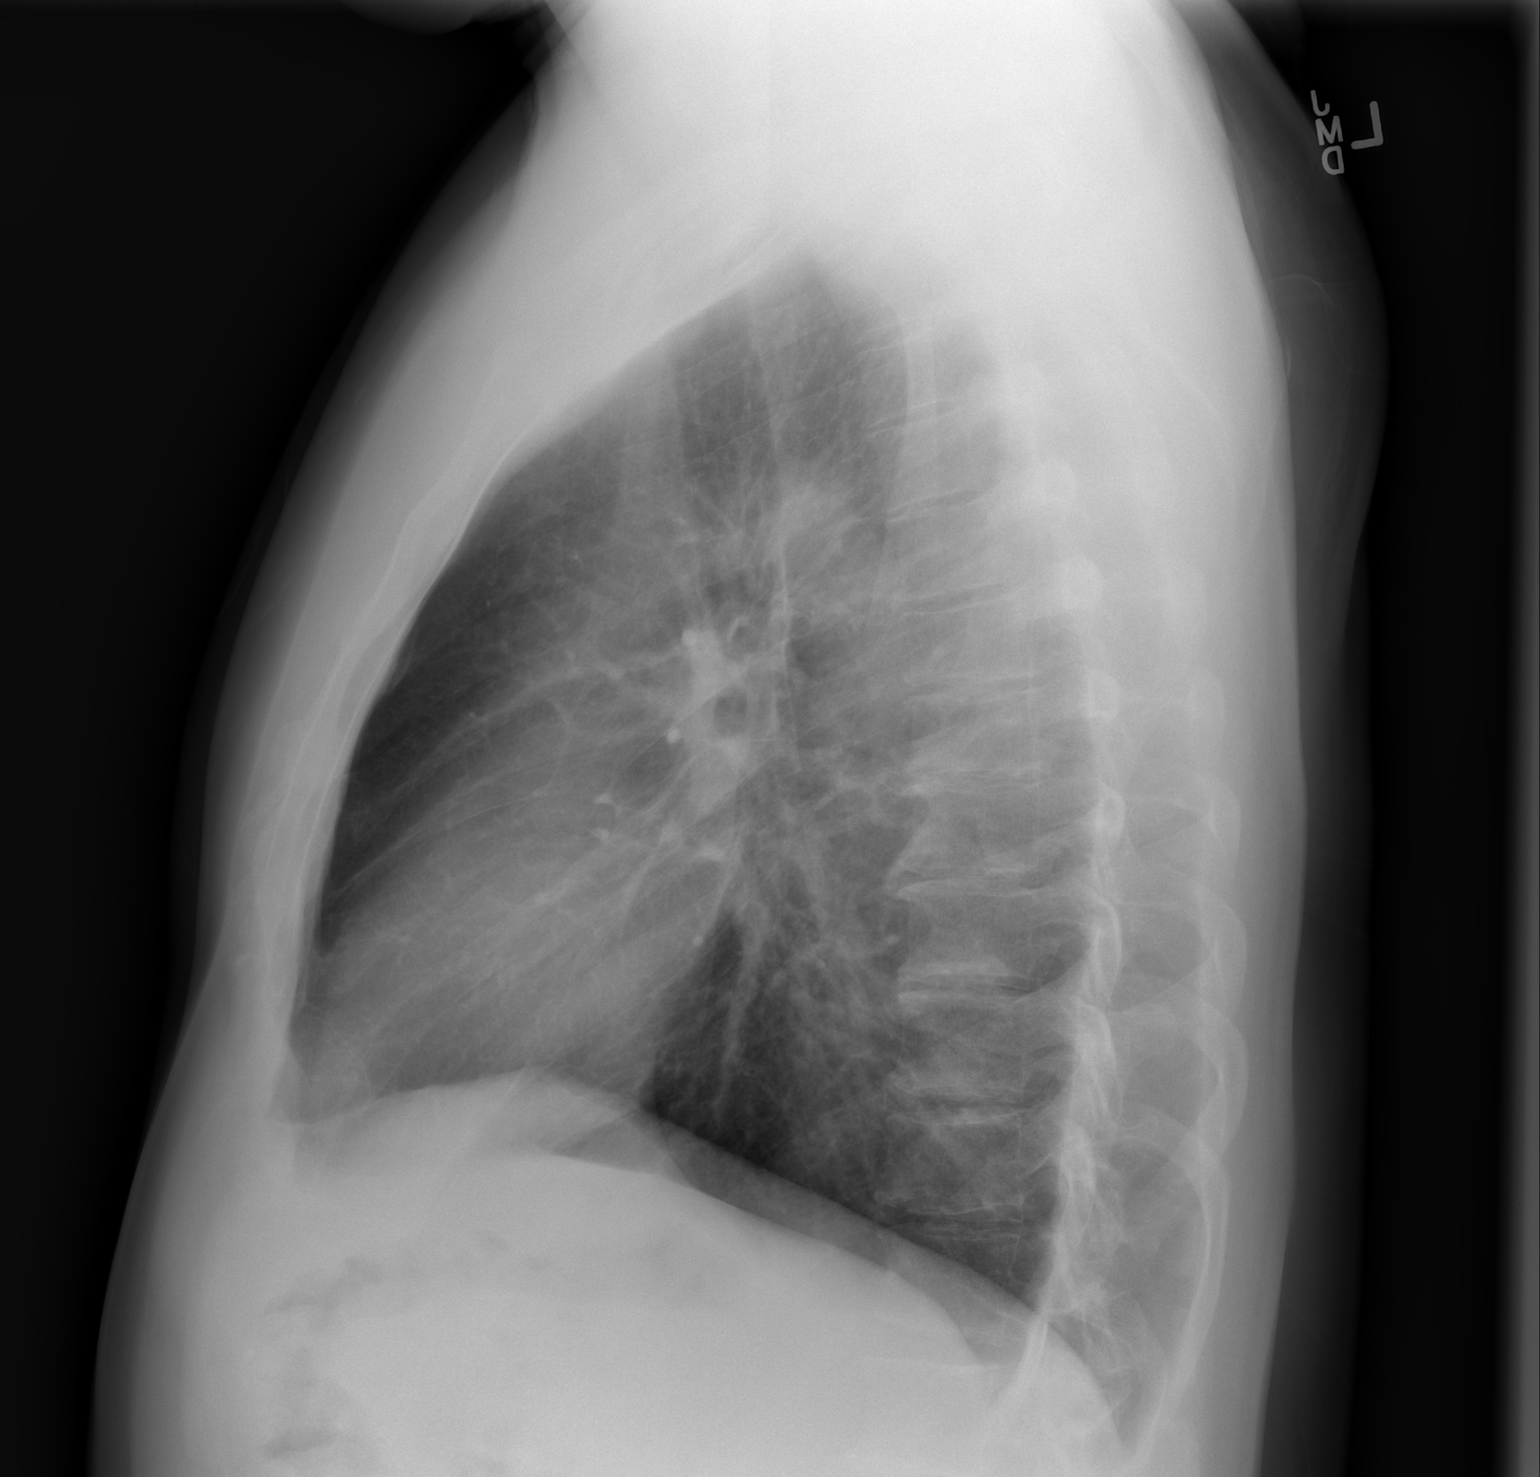

[2 of 2 positions shown; findings below may reference images not displayed]

FINDINGS: The lungs are clear without focal infiltrate, edema, pneumothorax or
pleural effusion. The cardiopericardial silhouette is within normal
limits for size. Imaged bony structures of the thorax are intact.
IMPRESSION: Stable.  No acute findings.

## 2014-10-02 NOTE — Anesthesia Preprocedure Evaluation (Addendum)
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 Asymptomatic. negative neurological ROS     GI/Hepatic Neg liver ROS, GERD-  ,  Endo/Other  negative endocrine ROS  Renal/GU negative Renal ROS  negative genitourinary   Musculoskeletal negative musculoskeletal ROS (+)   Abdominal   Peds negative pediatric ROS (+)  Hematology negative hematology ROS (+)   Anesthesia Other Findings   Reproductive/Obstetrics negative OB ROS                          Anesthesia Physical  Anesthesia Plan  ASA: III  Anesthesia Plan: Spinal   Post-op Pain Management:    Induction:   Airway Management Planned: Simple Face Mask  Additional Equipment:   Intra-op Plan:   Post-operative Plan:   Informed Consent: I have reviewed the patients History and Physical, chart, labs and discussed the procedure including the risks, benefits and alternatives for the proposed anesthesia with the patient or authorized representative who has indicated his/her understanding and acceptance.   Dental advisory given  Plan Discussed with: CRNA and Surgeon  Anesthesia Plan Comments: (11mm aneurysm ICA. Neurosurgical clearance done. Previous GA without problem. Avoid hypo/hypertension. )      Anesthesia Quick Evaluation

## 2014-10-05 ENCOUNTER — Ambulatory Visit: Payer: Self-pay | Admitting: Orthopedic Surgery

## 2014-10-05 NOTE — H&P (Signed)
Philip Gates DOB: 20-Dec-1955 Married / Language: English / Race: White Male Date of Admission:  10/06/2014 CC:  Right Knee Pain History of Present Illness  The patient is a 59 year old male who comes in for a preoperative History and Physical. The patient is scheduled for a right total knee arthroplasty to be performed by Dr. Dione Plover. Aluisio, MD at [facility] on 10-06-2014. The patient is being followed for their right knee pain and osteoarthritis. Symptoms reported today include: pain, instability and difficulty ambulating. The patient feels that they are doing poorly. The following medication has been used for pain control: antiinflammatory medication (Advil). He is ready to proceed with the right total knee arthroplasty. He feels like the hamstring pain is slowly getting better with the ice and stretching. They have been treated conservatively in the past for the above stated problem and despite conservative measures, they continue to have progressive pain and severe functional limitations and dysfunction. They have failed non-operative management including home exercise, medications. It is felt that they would benefit from undergoing total joint replacement. Risks and benefits of the procedure have been discussed with the patient and they elect to proceed with surgery. There are no active contraindications to surgery such as ongoing infection or rapidly progressive neurological disease.  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 Aspirin *ANALGESICS - NonNarcotic* interaction with current medication so avoids aspirin  Family History Rheumatoid Arthritis grandmother fathers side Cancer father and grandfather mothers side Cerebrovascular Accident grandfather fathers side grandmother fathers side and grandfather fathers side Heart disease in male family member before age 59 Hypertension mother Heart Disease Mother. mother Diabetes Mellitus father father and grandfather fathers side  Social History Pain Contract no Illicit drug use no Tobacco / smoke exposure yes Alcohol use current drinker; drinks beer Tobacco use Former smoker. former smoker; smoke(d) less than 1/2 pack(s) per day; uses 1 1/2 can(s) smokeless per week former smoker; smoke(d) less than 1/2 pack(s) per day; uses 2 or more can(s) smokeless per week Number of flights of stairs before winded greater than 5 Drug/Alcohol Rehab (Previously) no Drug/Alcohol Rehab (Currently) no Exercise Exercises weekly; does individual sport Exercises weekly; does running / walking, individual sport and other Marital status married Living situation live with spouse Current work status working full time Children 2  Medication History Xopenex HFA (45MCG/ACT Aerosol, Training and development officer) Active. Testosterone Cypionate (200MG /ML Oil, Intramuscular) Active. Montelukast Sodium (10MG  Tablet, Oral) Active. LamoTRIgine (200MG  Tablet, Oral) Active. Ipratropium Bromide (0.03% Solution, Nasal) Active. Depakote ER (500MG  Tablet ER 24HR, Oral) Active. Dexilant (60MG  Capsule DR, Oral every morning) Active. Cialis (20MG  Tablet, Oral) Active. Aciphex (20MG  Tablet DR, Oral at bedtime) Active. Advil (200MG  Tablet, Oral) Active. Vitamin C (Oral) Active. Vyvanse (20MG  Capsule, Oral) Active. Jalyn (0.5-0.4MG  Capsule, Oral) Active.  Past Surgical History Total Hip Replacement - Left Anterior Approach Arthroscopy of Knee right Sinus  Surgery Straighten Nasal Septum   Review of Systems General Not Present- Chills, Fatigue, Fever, Memory Loss, Night Sweats, Weight Gain and Weight Loss. Skin Not Present- Eczema, Hives, Itching, Lesions and Rash. HEENT Not Present- Dentures, Double Vision, Headache, Hearing Loss, Tinnitus and Visual Loss. Respiratory Not  Present- Allergies, Chronic Cough, Coughing up blood, Shortness of breath at rest and Shortness of breath with exertion. Cardiovascular Not Present- Chest Pain, Difficulty Breathing Lying Down, Murmur, Palpitations, Racing/skipping heartbeats and Swelling. Gastrointestinal Not Present- Abdominal Pain, Bloody Stool, Constipation, Diarrhea, Difficulty Swallowing, Heartburn, Jaundice, Loss of appetitie, Nausea and Vomiting. Male Genitourinary Not Present- Blood in Urine, Discharge, Flank Pain, Incontinence, Painful Urination, Urgency, Urinary frequency, Urinary Retention, Urinating at Night and Weak urinary stream. Musculoskeletal Present- Joint Pain. Not Present- Back Pain, Joint Swelling, Morning Stiffness, Muscle Pain, Muscle Weakness and Spasms. Neurological Not Present- Blackout spells, Difficulty with balance, Dizziness, Paralysis, Tremor and Weakness. Psychiatric Not Present- Insomnia.   Vitals Weight: 200 lb Height: 71in Weight was reported by patient. Height was reported by patient. Body Surface Area: 2.11 m Body Mass Index: 27.89 kg/m  BP: 138/78 (Sitting, Right Arm, Standard)   Physical Exam  General Mental Status -Alert, cooperative and good historian. General Appearance-pleasant, Not in acute distress. Orientation-Oriented X3. Build & Nutrition-Well nourished and Well developed.  Head and Neck Head-normocephalic, atraumatic . Neck Global Assessment - supple, no bruit auscultated on the right, no bruit auscultated on the left. Note: upper partial denture plate   Eye Pupil - Bilateral-Regular and Round. Motion -  Bilateral-EOMI.  Chest and Lung Exam Auscultation Breath sounds - clear at anterior chest wall and clear at posterior chest wall. Adventitious sounds - No Adventitious sounds.  Cardiovascular Auscultation Rhythm - Regular rate and rhythm. Heart Sounds - S1 WNL and S2 WNL. Murmurs & Other Heart Sounds - Auscultation of the heart reveals - No Murmurs.  Abdomen Palpation/Percussion Tenderness - Abdomen is non-tender to palpation. Rigidity (guarding) - Abdomen is soft. Auscultation Auscultation of the abdomen reveals - Bowel sounds normal.  Male Genitourinary Note: Not done, not pertinent to present illness   Musculoskeletal Note: General: He is alert and oriented, no apparent distress.  Extremities: His right knee shows no effusion. His range is about 5 to 125. He has got marked crepitus on range of motion. He is tender medial greater than lateral with no instability noted. Left knee exam is unremarkable.  X-RAYS: He has severe bone on patellofemoral with large osteophytes and has significant medial joint space narrowing with osteophytes.   Assessment & Plan Osteoarthritis of right knee (M17.9) Note:Plan is for a Right Total Knee Replacement by Dr. Wynelle Link.  Plan is to go home.  PCP - Dr. Wende Neighbors CV - Dr. Consuella Lose [facility] - Patient was seen and cleared for upcoming surgery. "Maintain normotension especially during induction and emergence.  Proceed with knee replacement as scheduled, with special attention to management of operative and perioperative blood pressure.  Once his knee replacement is completed, we will schedule diagnostic cerebral angiography a few weeks later for further workup and characterization of the aneurysm."  Topical TXA - Vascular Disease.  Please note that the patient had some urinary retention after previous hip replacement.  Signed electronically by Gearlean Alf, MD

## 2014-10-06 ENCOUNTER — Inpatient Hospital Stay (HOSPITAL_COMMUNITY): Payer: 59 | Admitting: Anesthesiology

## 2014-10-06 ENCOUNTER — Inpatient Hospital Stay (HOSPITAL_COMMUNITY)
Admission: RE | Admit: 2014-10-06 | Discharge: 2014-10-09 | DRG: 470 | Disposition: A | Discharge status: Home Health | Payer: 59 | Source: Ambulatory Visit | Attending: Orthopedic Surgery | Admitting: Orthopedic Surgery

## 2014-10-06 ENCOUNTER — Encounter (HOSPITAL_COMMUNITY)
Admission: RE | Disposition: A | Discharge status: Home Health | Payer: Self-pay | Source: Ambulatory Visit | Attending: Orthopedic Surgery

## 2014-10-06 ENCOUNTER — Encounter (HOSPITAL_COMMUNITY): Payer: Self-pay | Admitting: *Deleted

## 2014-10-06 DIAGNOSIS — M1711 Unilateral primary osteoarthritis, right knee: Secondary | ICD-10-CM | POA: Diagnosis present

## 2014-10-06 DIAGNOSIS — F319 Bipolar disorder, unspecified: Secondary | ICD-10-CM | POA: Diagnosis present

## 2014-10-06 DIAGNOSIS — Z79899 Other long term (current) drug therapy: Secondary | ICD-10-CM | POA: Diagnosis not present

## 2014-10-06 DIAGNOSIS — Z96642 Presence of left artificial hip joint: Secondary | ICD-10-CM | POA: Diagnosis present

## 2014-10-06 DIAGNOSIS — M1710 Unilateral primary osteoarthritis, unspecified knee: Secondary | ICD-10-CM | POA: Diagnosis present

## 2014-10-06 DIAGNOSIS — K219 Gastro-esophageal reflux disease without esophagitis: Secondary | ICD-10-CM | POA: Diagnosis present

## 2014-10-06 DIAGNOSIS — M179 Osteoarthritis of knee, unspecified: Secondary | ICD-10-CM | POA: Diagnosis present

## 2014-10-06 DIAGNOSIS — M25561 Pain in right knee: Secondary | ICD-10-CM | POA: Diagnosis present

## 2014-10-06 HISTORY — PX: TOTAL KNEE ARTHROPLASTY: SHX125

## 2014-10-06 HISTORY — DX: Benign prostatic hyperplasia without lower urinary tract symptoms: N40.0

## 2014-10-06 LAB — TYPE AND SCREEN
ABO/RH(D): AB POS
ANTIBODY SCREEN: NEGATIVE

## 2014-10-06 SURGERY — ARTHROPLASTY, KNEE, TOTAL
Anesthesia: Spinal | Site: Knee | Laterality: Right

## 2014-10-06 MED ORDER — LOPERAMIDE HCL 2 MG PO CAPS: 2.0000 mg | ORAL_CAPSULE | Freq: Three times a day (TID) | ORAL | Status: DC | PRN

## 2014-10-06 MED ORDER — SODIUM CHLORIDE 0.9 % IJ SOLN
INTRAMUSCULAR | Status: AC
  Filled 2014-10-06: qty 50

## 2014-10-06 MED ORDER — DEXTROSE-NACL 5-0.9 % IV SOLN
INTRAVENOUS | Status: DC
  Administered 2014-10-06 (×2): via INTRAVENOUS

## 2014-10-06 MED ORDER — FLEET ENEMA 7-19 GM/118ML RE ENEM: 1.0000 | ENEMA | Freq: Once | RECTAL | Status: AC | PRN

## 2014-10-06 MED ORDER — SODIUM CHLORIDE 0.9 % IV SOLN
2000.0000 mg | Freq: Once | INTRAVENOUS | Status: DC
  Filled 2014-10-06: qty 20

## 2014-10-06 MED ORDER — DIVALPROEX SODIUM ER 500 MG PO TB24
1500.0000 mg | ORAL_TABLET | Freq: Every day | ORAL | Status: DC
  Administered 2014-10-06 – 2014-10-08 (×3): 1500 mg via ORAL
  Filled 2014-10-06 (×4): qty 3

## 2014-10-06 MED ORDER — LACTATED RINGERS IV SOLN
INTRAVENOUS | Status: DC
  Administered 2014-10-06: 10:00:00 via INTRAVENOUS

## 2014-10-06 MED ORDER — FENTANYL CITRATE 0.05 MG/ML IJ SOLN
INTRAMUSCULAR | Status: AC
  Filled 2014-10-06: qty 2

## 2014-10-06 MED ORDER — BISACODYL 10 MG RE SUPP: 10.0000 mg | Freq: Every day | RECTAL | Status: DC | PRN

## 2014-10-06 MED ORDER — ACETAMINOPHEN 650 MG RE SUPP: 650.0000 mg | Freq: Four times a day (QID) | RECTAL | Status: DC | PRN

## 2014-10-06 MED ORDER — BUPIVACAINE LIPOSOME 1.3 % IJ SUSP
INTRAMUSCULAR | Status: DC | PRN
  Administered 2014-10-06: 20 mL

## 2014-10-06 MED ORDER — DEXTROSE 5 % IV SOLN
10.0000 mg | INTRAVENOUS | Status: DC | PRN
  Administered 2014-10-06: 40 ug/min via INTRAVENOUS

## 2014-10-06 MED ORDER — PROPOFOL 10 MG/ML IV BOLUS
INTRAVENOUS | Status: AC
  Filled 2014-10-06: qty 20

## 2014-10-06 MED ORDER — MIDAZOLAM HCL 5 MG/5ML IJ SOLN
INTRAMUSCULAR | Status: DC | PRN
  Administered 2014-10-06: 2 mg via INTRAVENOUS

## 2014-10-06 MED ORDER — RIVAROXABAN 10 MG PO TABS: 10.0000 mg | ORAL_TABLET | Freq: Every day | ORAL | Status: DC

## 2014-10-06 MED ORDER — TAMSULOSIN HCL 0.4 MG PO CAPS
0.4000 mg | ORAL_CAPSULE | Freq: Every day | ORAL | Status: DC
  Administered 2014-10-06 – 2014-10-07 (×2): 0.4 mg via ORAL
  Filled 2014-10-06 (×4): qty 1

## 2014-10-06 MED ORDER — PROPOFOL INFUSION 10 MG/ML OPTIME
INTRAVENOUS | Status: DC | PRN
  Administered 2014-10-06: 100 ug/kg/min via INTRAVENOUS

## 2014-10-06 MED ORDER — VANCOMYCIN HCL IN DEXTROSE 1-5 GM/200ML-% IV SOLN
1000.0000 mg | Freq: Once | INTRAVENOUS | Status: AC
  Administered 2014-10-06: 1000 mg via INTRAVENOUS
  Filled 2014-10-06: qty 200

## 2014-10-06 MED ORDER — PHENYLEPHRINE HCL 10 MG/ML IJ SOLN: 30.0000 ug/min | INTRAVENOUS | Status: DC

## 2014-10-06 MED ORDER — OXYCODONE HCL 5 MG PO TABS
10.0000 mg | ORAL_TABLET | ORAL | Status: DC | PRN
  Administered 2014-10-06 (×2): 10 mg via ORAL
  Administered 2014-10-06: 20 mg via ORAL
  Administered 2014-10-06 – 2014-10-07 (×2): 15 mg via ORAL
  Administered 2014-10-07 – 2014-10-09 (×17): 20 mg via ORAL
  Filled 2014-10-06: qty 4
  Filled 2014-10-06: qty 2
  Filled 2014-10-06 (×12): qty 4
  Filled 2014-10-06: qty 3
  Filled 2014-10-06 (×7): qty 4

## 2014-10-06 MED ORDER — VANCOMYCIN HCL IN DEXTROSE 1-5 GM/200ML-% IV SOLN
1000.0000 mg | INTRAVENOUS | Status: AC
  Administered 2014-10-06: 1000 mg via INTRAVENOUS

## 2014-10-06 MED ORDER — MENTHOL 3 MG MT LOZG
1.0000 | LOZENGE | OROMUCOSAL | Status: DC | PRN
  Filled 2014-10-06: qty 9

## 2014-10-06 MED ORDER — PHENOL 1.4 % MT LIQD
1.0000 | OROMUCOSAL | Status: DC | PRN
  Filled 2014-10-06: qty 177

## 2014-10-06 MED ORDER — ACETAMINOPHEN 500 MG PO TABS
1000.0000 mg | ORAL_TABLET | Freq: Four times a day (QID) | ORAL | Status: AC
  Administered 2014-10-06 – 2014-10-07 (×4): 1000 mg via ORAL
  Filled 2014-10-06 (×4): qty 2

## 2014-10-06 MED ORDER — DOCUSATE SODIUM 100 MG PO CAPS
100.0000 mg | ORAL_CAPSULE | Freq: Two times a day (BID) | ORAL | Status: DC
  Administered 2014-10-06 – 2014-10-07 (×3): 100 mg via ORAL

## 2014-10-06 MED ORDER — ACETAMINOPHEN 325 MG PO TABS
650.0000 mg | ORAL_TABLET | Freq: Four times a day (QID) | ORAL | Status: DC | PRN
  Administered 2014-10-08: 650 mg via ORAL
  Filled 2014-10-06: qty 2

## 2014-10-06 MED ORDER — RIVAROXABAN 10 MG PO TABS
10.0000 mg | ORAL_TABLET | Freq: Every day | ORAL | Status: DC
  Administered 2014-10-07 – 2014-10-09 (×2): 10 mg via ORAL
  Filled 2014-10-06 (×4): qty 1

## 2014-10-06 MED ORDER — LISDEXAMFETAMINE DIMESYLATE 70 MG PO CAPS
70.0000 mg | ORAL_CAPSULE | Freq: Every morning | ORAL | Status: DC
  Administered 2014-10-06 – 2014-10-09 (×4): 70 mg via ORAL
  Filled 2014-10-06 (×3): qty 1

## 2014-10-06 MED ORDER — ACETAMINOPHEN 10 MG/ML IV SOLN
INTRAVENOUS | Status: DC | PRN
  Administered 2014-10-06: 1000 mg via INTRAVENOUS

## 2014-10-06 MED ORDER — METHOCARBAMOL 1000 MG/10ML IJ SOLN
500.0000 mg | Freq: Four times a day (QID) | INTRAMUSCULAR | Status: DC | PRN
  Administered 2014-10-06 – 2014-10-08 (×2): 500 mg via INTRAVENOUS
  Filled 2014-10-06 (×3): qty 5

## 2014-10-06 MED ORDER — SODIUM CHLORIDE 0.9 % IV SOLN
INTRAVENOUS | Status: DC
Start: 2014-10-06 — End: 2014-10-06

## 2014-10-06 MED ORDER — POLYETHYLENE GLYCOL 3350 17 G PO PACK: 17.0000 g | PACK | Freq: Every day | ORAL | Status: DC | PRN

## 2014-10-06 MED ORDER — IPRATROPIUM BROMIDE 0.03 % NA SOLN
2.0000 | Freq: Three times a day (TID) | NASAL | Status: DC | PRN
  Filled 2014-10-06: qty 30

## 2014-10-06 MED ORDER — BUPIVACAINE HCL 0.25 % IJ SOLN
INTRAMUSCULAR | Status: DC | PRN
  Administered 2014-10-06: 20 mL

## 2014-10-06 MED ORDER — LAMOTRIGINE 200 MG PO TABS
200.0000 mg | ORAL_TABLET | Freq: Every day | ORAL | Status: DC
  Administered 2014-10-06 – 2014-10-08 (×3): 200 mg via ORAL
  Filled 2014-10-06 (×4): qty 1

## 2014-10-06 MED ORDER — ALBUTEROL SULFATE (2.5 MG/3ML) 0.083% IN NEBU: 3.0000 mL | INHALATION_SOLUTION | Freq: Four times a day (QID) | RESPIRATORY_TRACT | Status: DC | PRN

## 2014-10-06 MED ORDER — MIDAZOLAM HCL 2 MG/2ML IJ SOLN
INTRAMUSCULAR | Status: AC
  Filled 2014-10-06: qty 2

## 2014-10-06 MED ORDER — OXYCODONE HCL 5 MG PO TABS
5.0000 mg | ORAL_TABLET | ORAL | Status: DC | PRN
  Administered 2014-10-06: 10 mg via ORAL
  Filled 2014-10-06: qty 2

## 2014-10-06 MED ORDER — METHOCARBAMOL 500 MG PO TABS: 500.0000 mg | ORAL_TABLET | Freq: Four times a day (QID) | ORAL | Status: DC | PRN

## 2014-10-06 MED ORDER — MONTELUKAST SODIUM 10 MG PO TABS
10.0000 mg | ORAL_TABLET | Freq: Every morning | ORAL | Status: DC
  Administered 2014-10-06 – 2014-10-09 (×4): 10 mg via ORAL
  Filled 2014-10-06 (×4): qty 1

## 2014-10-06 MED ORDER — BUPIVACAINE LIPOSOME 1.3 % IJ SUSP
20.0000 mL | Freq: Once | INTRAMUSCULAR | Status: DC
  Filled 2014-10-06: qty 20

## 2014-10-06 MED ORDER — PANTOPRAZOLE SODIUM 40 MG PO TBEC: 40.0000 mg | DELAYED_RELEASE_TABLET | Freq: Every day | ORAL | Status: DC

## 2014-10-06 MED ORDER — METHOCARBAMOL 500 MG PO TABS
500.0000 mg | ORAL_TABLET | Freq: Four times a day (QID) | ORAL | Status: DC | PRN
  Administered 2014-10-06 – 2014-10-09 (×9): 500 mg via ORAL
  Filled 2014-10-06 (×10): qty 1

## 2014-10-06 MED ORDER — LACTATED RINGERS IV SOLN: INTRAVENOUS | Status: DC

## 2014-10-06 MED ORDER — ONDANSETRON HCL 4 MG/2ML IJ SOLN
4.0000 mg | Freq: Four times a day (QID) | INTRAMUSCULAR | Status: DC | PRN
Start: 2014-10-06 — End: 2014-10-09

## 2014-10-06 MED ORDER — PROPOFOL 10 MG/ML IV BOLUS
INTRAVENOUS | Status: DC | PRN
  Administered 2014-10-06: 20 mg via INTRAVENOUS
  Administered 2014-10-06: 30 mg via INTRAVENOUS

## 2014-10-06 MED ORDER — TRAMADOL HCL 50 MG PO TABS: 50.0000 mg | ORAL_TABLET | Freq: Four times a day (QID) | ORAL | Status: DC | PRN

## 2014-10-06 MED ORDER — TRAMADOL HCL 50 MG PO TABS
50.0000 mg | ORAL_TABLET | Freq: Four times a day (QID) | ORAL | Status: DC | PRN
  Administered 2014-10-06: 100 mg via ORAL
  Administered 2014-10-08: 50 mg via ORAL
  Administered 2014-10-09: 100 mg via ORAL
  Filled 2014-10-06: qty 1
  Filled 2014-10-06 (×2): qty 2

## 2014-10-06 MED ORDER — METOCLOPRAMIDE HCL 10 MG PO TABS
5.0000 mg | ORAL_TABLET | Freq: Three times a day (TID) | ORAL | Status: DC | PRN
  Administered 2014-10-08: 10 mg via ORAL
  Filled 2014-10-06: qty 1

## 2014-10-06 MED ORDER — ONDANSETRON HCL 4 MG PO TABS: 4.0000 mg | ORAL_TABLET | Freq: Four times a day (QID) | ORAL | Status: DC | PRN

## 2014-10-06 MED ORDER — BUPIVACAINE IN DEXTROSE 0.75-8.25 % IT SOLN
INTRATHECAL | Status: DC | PRN
  Administered 2014-10-06: 1.8 mL via INTRATHECAL

## 2014-10-06 MED ORDER — LACTATED RINGERS IV SOLN
INTRAVENOUS | Status: DC | PRN
  Administered 2014-10-06 (×2): via INTRAVENOUS

## 2014-10-06 MED ORDER — HYDROMORPHONE HCL 1 MG/ML IJ SOLN
0.5000 mg | INTRAMUSCULAR | Status: DC | PRN
  Administered 2014-10-06 – 2014-10-09 (×8): 1 mg via INTRAVENOUS
  Filled 2014-10-06 (×7): qty 1

## 2014-10-06 MED ORDER — VANCOMYCIN HCL IN DEXTROSE 1-5 GM/200ML-% IV SOLN
INTRAVENOUS | Status: AC
  Filled 2014-10-06: qty 200

## 2014-10-06 MED ORDER — MORPHINE SULFATE 2 MG/ML IJ SOLN
1.0000 mg | INTRAMUSCULAR | Status: DC | PRN
  Administered 2014-10-06: 2 mg via INTRAVENOUS
  Filled 2014-10-06: qty 1

## 2014-10-06 MED ORDER — DEXAMETHASONE SODIUM PHOSPHATE 10 MG/ML IJ SOLN
10.0000 mg | Freq: Once | INTRAMUSCULAR | Status: AC
  Administered 2014-10-07: 10 mg via INTRAVENOUS
  Filled 2014-10-06: qty 1

## 2014-10-06 MED ORDER — CLONIDINE HCL 0.1 MG PO TABS
0.1000 mg | ORAL_TABLET | Freq: Two times a day (BID) | ORAL | Status: DC | PRN
  Filled 2014-10-06: qty 1

## 2014-10-06 MED ORDER — BUPIVACAINE HCL (PF) 0.25 % IJ SOLN
INTRAMUSCULAR | Status: AC
  Filled 2014-10-06: qty 30

## 2014-10-06 MED ORDER — OXYCODONE HCL 10 MG PO TABS: 10.0000 mg | ORAL_TABLET | ORAL | Status: DC | PRN

## 2014-10-06 MED ORDER — CEFAZOLIN SODIUM-DEXTROSE 2-3 GM-% IV SOLR
2.0000 g | Freq: Four times a day (QID) | INTRAVENOUS | Status: DC
  Filled 2014-10-06 (×2): qty 50

## 2014-10-06 MED ORDER — HYDROMORPHONE HCL 1 MG/ML IJ SOLN
INTRAMUSCULAR | Status: AC
  Filled 2014-10-06: qty 1

## 2014-10-06 MED ORDER — PROPOFOL 10 MG/ML IV BOLUS
INTRAVENOUS | Status: AC
Start: 2014-10-06 — End: 2014-10-06
  Filled 2014-10-06: qty 20

## 2014-10-06 MED ORDER — DIPHENHYDRAMINE HCL 12.5 MG/5ML PO ELIX: 12.5000 mg | ORAL_SOLUTION | ORAL | Status: DC | PRN

## 2014-10-06 MED ORDER — DEXAMETHASONE SODIUM PHOSPHATE 10 MG/ML IJ SOLN
10.0000 mg | Freq: Once | INTRAMUSCULAR | Status: AC
  Administered 2014-10-06: 10 mg via INTRAVENOUS

## 2014-10-06 MED ORDER — PANTOPRAZOLE SODIUM 40 MG PO TBEC
80.0000 mg | DELAYED_RELEASE_TABLET | Freq: Every day | ORAL | Status: DC
  Administered 2014-10-07 – 2014-10-09 (×3): 80 mg via ORAL
  Filled 2014-10-06 (×3): qty 2

## 2014-10-06 MED ORDER — HYDROMORPHONE HCL 1 MG/ML IJ SOLN: 0.2500 mg | INTRAMUSCULAR | Status: DC | PRN

## 2014-10-06 MED ORDER — ACETAMINOPHEN 10 MG/ML IV SOLN
1000.0000 mg | Freq: Once | INTRAVENOUS | Status: DC
  Filled 2014-10-06: qty 100

## 2014-10-06 MED ORDER — FENTANYL CITRATE 0.05 MG/ML IJ SOLN
INTRAMUSCULAR | Status: DC | PRN
  Administered 2014-10-06: 50 ug via INTRAVENOUS

## 2014-10-06 MED ORDER — SODIUM CHLORIDE 0.9 % IV SOLN
2000.0000 mg | INTRAVENOUS | Status: DC | PRN
  Administered 2014-10-06: 2000 mg via TOPICAL

## 2014-10-06 MED ORDER — SODIUM CHLORIDE 0.9 % IJ SOLN
INTRAMUSCULAR | Status: DC | PRN
  Administered 2014-10-06: 30 mL via INTRAVENOUS

## 2014-10-06 MED ORDER — METOCLOPRAMIDE HCL 5 MG/ML IJ SOLN: 5.0000 mg | Freq: Three times a day (TID) | INTRAMUSCULAR | Status: DC | PRN

## 2014-10-06 MED ORDER — PHENYLEPHRINE HCL 10 MG/ML IJ SOLN
INTRAMUSCULAR | Status: AC
  Filled 2014-10-06: qty 1

## 2014-10-06 SURGICAL SUPPLY — 61 items
BAG SPEC THK2 15X12 ZIP CLS (MISCELLANEOUS) ×1
BAG ZIPLOCK 12X15 (MISCELLANEOUS) ×3 IMPLANT
BANDAGE ELASTIC 6 VELCRO ST LF (GAUZE/BANDAGES/DRESSINGS) ×3 IMPLANT
BANDAGE ESMARK 6X9 LF (GAUZE/BANDAGES/DRESSINGS) ×1 IMPLANT
BLADE SAG 18X100X1.27 (BLADE) ×3 IMPLANT
BLADE SAW SGTL 11.0X1.19X90.0M (BLADE) ×3 IMPLANT
BNDG CMPR 9X6 STRL LF SNTH (GAUZE/BANDAGES/DRESSINGS) ×1
BNDG ESMARK 6X9 LF (GAUZE/BANDAGES/DRESSINGS) ×3
BOWL SMART MIX CTS (DISPOSABLE) ×3 IMPLANT
CAPT KNEE TOTAL 3 ATTUNE ×2 IMPLANT
CEMENT HV SMART SET (Cement) ×6 IMPLANT
CLOSURE WOUND 1/2 X4 (GAUZE/BANDAGES/DRESSINGS) ×2
CUFF TOURN SGL QUICK 34 (TOURNIQUET CUFF) ×3
CUFF TRNQT CYL 34X4X40X1 (TOURNIQUET CUFF) ×1 IMPLANT
DECANTER SPIKE VIAL GLASS SM (MISCELLANEOUS) ×3 IMPLANT
DRAPE EXTREMITY T 121X128X90 (DRAPE) ×3 IMPLANT
DRAPE POUCH INSTRU U-SHP 10X18 (DRAPES) ×3 IMPLANT
DRAPE U-SHAPE 47X51 STRL (DRAPES) ×3 IMPLANT
DRSG ADAPTIC 3X8 NADH LF (GAUZE/BANDAGES/DRESSINGS) ×3 IMPLANT
DRSG PAD ABDOMINAL 8X10 ST (GAUZE/BANDAGES/DRESSINGS) ×3 IMPLANT
DURAPREP 26ML APPLICATOR (WOUND CARE) ×3 IMPLANT
ELECT REM PT RETURN 9FT ADLT (ELECTROSURGICAL) ×3
ELECTRODE REM PT RTRN 9FT ADLT (ELECTROSURGICAL) ×1 IMPLANT
EVACUATOR 1/8 PVC DRAIN (DRAIN) ×3 IMPLANT
FACESHIELD WRAPAROUND (MASK) ×15 IMPLANT
GAUZE SPONGE 4X4 12PLY STRL (GAUZE/BANDAGES/DRESSINGS) ×3 IMPLANT
GLOVE BIO SURGEON STRL SZ7.5 (GLOVE) IMPLANT
GLOVE BIO SURGEON STRL SZ8 (GLOVE) ×3 IMPLANT
GLOVE BIOGEL PI IND STRL 6.5 (GLOVE) IMPLANT
GLOVE BIOGEL PI IND STRL 8 (GLOVE) ×1 IMPLANT
GLOVE BIOGEL PI INDICATOR 6.5 (GLOVE)
GLOVE BIOGEL PI INDICATOR 8 (GLOVE) ×2
GLOVE SURG SS PI 6.5 STRL IVOR (GLOVE) IMPLANT
GOWN STRL REUS W/TWL LRG LVL3 (GOWN DISPOSABLE) ×3 IMPLANT
GOWN STRL REUS W/TWL XL LVL3 (GOWN DISPOSABLE) IMPLANT
HANDPIECE INTERPULSE COAX TIP (DISPOSABLE) ×3
IMMOBILIZER KNEE 20 (SOFTGOODS) ×3
IMMOBILIZER KNEE 20 THIGH 36 (SOFTGOODS) ×1 IMPLANT
KIT BASIN OR (CUSTOM PROCEDURE TRAY) ×3 IMPLANT
MANIFOLD NEPTUNE II (INSTRUMENTS) ×3 IMPLANT
NDL SAFETY ECLIPSE 18X1.5 (NEEDLE) ×2 IMPLANT
NEEDLE HYPO 18GX1.5 SHARP (NEEDLE) ×6
NS IRRIG 1000ML POUR BTL (IV SOLUTION) ×3 IMPLANT
PACK TOTAL JOINT (CUSTOM PROCEDURE TRAY) ×3 IMPLANT
PAD ABD 8X10 STRL (GAUZE/BANDAGES/DRESSINGS) ×2 IMPLANT
PADDING CAST COTTON 6X4 STRL (CAST SUPPLIES) ×3 IMPLANT
POSITIONER SURGICAL ARM (MISCELLANEOUS) ×3 IMPLANT
SET HNDPC FAN SPRY TIP SCT (DISPOSABLE) ×1 IMPLANT
STRIP CLOSURE SKIN 1/2X4 (GAUZE/BANDAGES/DRESSINGS) ×4 IMPLANT
SUCTION FRAZIER 12FR DISP (SUCTIONS) ×3 IMPLANT
SUT MNCRL AB 4-0 PS2 18 (SUTURE) ×3 IMPLANT
SUT VIC AB 2-0 CT1 27 (SUTURE) ×9
SUT VIC AB 2-0 CT1 TAPERPNT 27 (SUTURE) ×3 IMPLANT
SUT VLOC 180 0 24IN GS25 (SUTURE) ×3 IMPLANT
SYR 20CC LL (SYRINGE) ×3 IMPLANT
SYR 50ML LL SCALE MARK (SYRINGE) ×3 IMPLANT
TOWEL OR 17X26 10 PK STRL BLUE (TOWEL DISPOSABLE) ×3 IMPLANT
TOWEL OR NON WOVEN STRL DISP B (DISPOSABLE) IMPLANT
TRAY FOLEY CATH 14FRSI W/METER (CATHETERS) ×3 IMPLANT
WATER STERILE IRR 1500ML POUR (IV SOLUTION) ×3 IMPLANT
WRAP KNEE MAXI GEL POST OP (GAUZE/BANDAGES/DRESSINGS) ×3 IMPLANT

## 2014-10-06 NOTE — Interval H&P Note (Signed)
History and Physical Interval Note:  10/06/2014 7:13 AM  Philip Gates  has presented today for surgery, with the diagnosis of RIGHT KNEE OA  The various methods of treatment have been discussed with the patient and family. After consideration of risks, benefits and other options for treatment, the patient has consented to  Procedure(s): RIGHT TOTAL KNEE ARTHROPLASTY (Right) as a surgical intervention .  The patient's history has been reviewed, patient examined, no change in status, stable for surgery.  I have reviewed the patient's chart and labs.  Questions were answered to the patient's satisfaction.     Gearlean Alf

## 2014-10-06 NOTE — Transfer of Care (Signed)
Immediate Anesthesia Transfer of Care Note  Patient: Philip Gates  Procedure(s) Performed: Procedure(s) (LRB): RIGHT TOTAL KNEE ARTHROPLASTY (Right)  Patient Location: PACU  Anesthesia Type: Spinal  Level of Consciousness: sedated, patient cooperative and responds to stimulation  Airway & Oxygen Therapy: Patient Spontanous Breathing and Patient connected to face mask oxgen  Post-op Assessment: Report given to PACU RN and Post -op Vital signs reviewed and stable  Post vital signs: Reviewed and stable  Complications: No apparent anesthesia complications

## 2014-10-06 NOTE — Progress Notes (Signed)
Stitches removed from oral surgery 10/05/2014

## 2014-10-06 NOTE — Transfer of Care (Deleted)
Immediate Anesthesia Transfer of Care Note  Patient: Philip Gates  Procedure(s) Performed: Procedure(s) (LRB): RIGHT TOTAL KNEE ARTHROPLASTY (Right)  Patient Location: PACU  Anesthesia Type: Spinal  Level of Consciousness: sedated, patient cooperative and responds to stimulation  Airway & Oxygen Therapy: Patient Spontanous Breathing and Patient connected to face mask oxgen  Post-op Assessment: Report given to PACU RN and Post -op Vital signs reviewed and stable  Post vital signs: Reviewed and stable  Complications: No apparent anesthesia complications

## 2014-10-06 NOTE — Anesthesia Postprocedure Evaluation (Signed)
  Anesthesia Post-op Note  Patient: Philip Gates  Procedure(s) Performed: Procedure(s) (LRB): RIGHT TOTAL KNEE ARTHROPLASTY (Right)  Patient Location: PACU  Anesthesia Type: Spinal  Level of Consciousness: awake and alert   Airway and Oxygen Therapy: Patient Spontanous Breathing  Post-op Pain: mild  Post-op Assessment: Post-op Vital signs reviewed, Patient's Cardiovascular Status Stable, Respiratory Function Stable, Patent Airway and No signs of Nausea or vomiting  Last Vitals:  Filed Vitals:   10/06/14 1402  BP: 158/90  Pulse: 87  Temp: 36.6 C  Resp: 14    Post-op Vital Signs: stable   Complications: No apparent anesthesia complications

## 2014-10-06 NOTE — Op Note (Signed)
Pre-operative diagnosis- Osteoarthritis  Right knee(s)  Post-operative diagnosis- Osteoarthritis Right knee(s)  Procedure-  Right  Total Knee Arthroplasty  Surgeon- Dione Plover. Donal Lynam, MD  Assistant- [name], PA-C   Anesthesia-  Spinal  EBL-* No blood loss amount entered *   Drains Hemovac  Tourniquet time-  Total Tourniquet Time Documented: Thigh (Right) - 39 minutes Total: Thigh (Right) - 39 minutes     Complications- None  Condition-PACU - hemodynamically stable.   Brief Clinical Note  Philip Gates is a 59 y.o. year old male with end stage OA of his right knee with progressively worsening pain and dysfunction. He has constant pain, with activity and at rest and significant functional deficits with difficulties even with ADLs. He has had extensive non-op management including analgesics, injections of cortisone and viscosupplements, and home exercise program, but remains in significant pain with significant dysfunction. Radiographs show bone on bone arthritis patellofemoral. He presents now for right Total Knee Arthroplasty.    Procedure in detail---   The patient is brought into the operating room and positioned supine on the operating table. After successful administration of  Spinal,   a tourniquet is placed high on the  Right thigh(s) and the lower extremity is prepped and draped in the usual sterile fashion. Time out is performed by the operating team and then the  Right lower extremity is wrapped in Esmarch, knee flexed and the tourniquet inflated to 300 mmHg.       A midline incision is made with a ten blade through the subcutaneous tissue to the level of the extensor mechanism. A fresh blade is used to make a medial parapatellar arthrotomy. Soft tissue over the proximal medial tibia is subperiosteally elevated to the joint line with a knife and into the semimembranosus bursa with a Cobb elevator. Soft tissue over the proximal lateral tibia is elevated with attention being  paid to avoiding the patellar tendon on the tibial tubercle. The patella is everted, knee flexed 90 degrees and the ACL and PCL are removed. Findings are bone on bone lateral and patellofemoral with large global osteophytes and exposed bone medially.         The drill is used to create a starting hole in the distal femur and the canal is thoroughly irrigated with sterile saline to remove the fatty contents. The 5 degree Right  valgus alignment guide is placed into the femoral canal and the distal femoral cutting block is pinned to remove 9 mm off the distal femur. Resection is made with an oscillating saw.      The tibia is subluxed forward and the menisci are removed. The extramedullary alignment guide is placed referencing proximally at the medial aspect of the tibial tubercle and distally along the second metatarsal axis and tibial crest. The block is pinned to remove 41mm off the more deficient medial  side. Resection is made with an oscillating saw. Size 6is the most appropriate size for the tibia and the proximal tibia is prepared with the modular drill and keel punch for that size.      The femoral sizing guide is placed and size 7 is most appropriate. Rotation is marked off the epicondylar axis and confirmed by creating a rectangular flexion gap at 90 degrees. The size 7 cutting block is pinned in this rotation and the anterior, posterior and chamfer cuts are made with the oscillating saw. The intercondylar block is then placed and that cut is made.      Trial size  6 tibial component, trial size 7 posterior stabilized femur and a 6  mm posterior stabilized rotating platform insert trial is placed. Full extension is achieved with excellent varus/valgus and anterior/posterior balance throughout full range of motion. The patella is everted and thickness measured to be 24  mm. Free hand resection is taken to 14 mm, a 38 template is placed, lug holes are drilled, trial patella is placed, and it tracks  normally. Osteophytes are removed off the posterior femur with the trial in place. All trials are removed and the cut bone surfaces prepared with pulsatile lavage. Cement is mixed and once ready for implantation, the size 6 tibial implant, size  7 posterior stabilized femoral component, and the size 38 patella are cemented in place and the patella is held with the clamp. The trial insert is placed and the knee held in full extension. The Exparel (20 ml mixed with 30 ml saline) and .25% Bupivicaine, are injected into the extensor mechanism, posterior capsule, medial and lateral gutters and subcutaneous tissues.  All extruded cement is removed and once the cement is hard the permanent 6 mm posterior stabilized rotating platform insert is placed into the tibial tray.      The wound is copiously irrigated with saline solution and the extensor mechanism closed over a hemovac drain with #1 V-loc suture. The tourniquet is released for a total tourniquet time of 40  minutes. Flexion against gravity is 140 degrees and the patella tracks normally. Subcutaneous tissue is closed with 2.0 vicryl and subcuticular with running 4.0 Monocryl. The incision is cleaned and dried and steri-strips and a bulky sterile dressing are applied. The limb is placed into a knee immobilizer and the patient is awakened and transported to recovery in stable condition.      Please note that a surgical assistant was a medical necessity for this procedure in order to perform it in a safe and expeditious manner. Surgical assistant was necessary to retract the ligaments and vital neurovascular structures to prevent injury to them and also necessary for proper positioning of the limb to allow for anatomic placement of the prosthesis.   Dione Plover Burdette Gergely, MD    10/06/2014, 8:40 AM

## 2014-10-06 NOTE — Evaluation (Signed)
Physical Therapy Evaluation Patient Details Name: Philip Gates MRN: [id number] DOB: [birth year] Today's Date: 10/06/2014   History of Present Illness  R TKR  Clinical Impression  Pt s/p R TKR presents with decreased R LE strength/ROM and post op pain limiting functional mobility.  Pt should progress well to d.c home with family assist and HHPT follow up.    Follow Up Recommendations Home health PT    Equipment Recommendations  None recommended by PT    Recommendations for Other Services OT consult     Precautions / Restrictions Precautions Precautions: Knee;Fall Required Braces or Orthoses: Knee Immobilizer - Right Knee Immobilizer - Right: Discontinue once straight leg raise with < 10 degree lag Restrictions Weight Bearing Restrictions: No Other Position/Activity Restrictions: WBAT      Mobility  Bed Mobility Overal bed mobility: Needs Assistance Bed Mobility: Supine to Sit     Supine to sit: Min assist     General bed mobility comments: cues for sequence and use of L LE to self assist  Transfers Overall transfer level: Needs assistance Equipment used: Rolling walker (2 wheeled) Transfers: Sit to/from Stand Sit to Stand: Min assist         General transfer comment: cues for LE management and use of UEs to self assist  Ambulation/Gait Ambulation/Gait assistance: Min assist Ambulation Distance (Feet): 32 Feet Assistive device: Rolling walker (2 wheeled) Gait Pattern/deviations: Step-to pattern;Decreased step length - right;Decreased step length - left;Shuffle;Trunk flexed Gait velocity: decr   General Gait Details: cues for sequence, posture and position from [employer]            Wheelchair Mobility    Modified Rankin (Stroke Patients Only)       Balance                                             Pertinent Vitals/Pain Pain Assessment: 0-10 Pain Score: 6  Pain Location: R knee Pain Descriptors / Indicators:  Aching;Sore Pain Intervention(s): Limited activity within patient's tolerance;Monitored during session;Premedicated before session;Ice applied    Home Living Family/patient expects to be discharged to:: Private residence Living Arrangements: Spouse/significant other Available Help at Discharge: Family Type of Home: House Home Access: Stairs to enter Entrance Stairs-Rails: Left Entrance Stairs-Number of Steps: 4 Home Layout: One level        Prior Function Level of Independence: Independent               Hand Dominance   Dominant Hand: Right    Extremity/Trunk Assessment   Upper Extremity Assessment: Overall WFL for tasks assessed           Lower Extremity Assessment: RLE deficits/detail      Cervical / Trunk Assessment: Normal  Communication   Communication: No difficulties  Cognition Arousal/Alertness: Awake/alert Behavior During Therapy: WFL for tasks assessed/performed Overall Cognitive Status: Within Functional Limits for tasks assessed                      General Comments      Exercises        Assessment/Plan    PT Assessment Patient needs continued PT services  PT Diagnosis Difficulty walking   PT Problem List Decreased strength;Decreased range of motion;Decreased activity tolerance;Decreased mobility;Decreased knowledge of use of DME;Pain  PT Treatment Interventions DME instruction;Gait training;Stair training;Functional mobility training;Therapeutic activities;Therapeutic exercise;Patient/family education  PT Goals (Current goals can be found in the Care Plan section) Acute Rehab PT Goals Patient Stated Goal: Resume previous lifestyle with decreased pain PT Goal Formulation: With patient Time For Goal Achievement: 10/13/14 Potential to Achieve Goals: Good    Frequency 7X/week   Barriers to discharge        Co-evaluation               End of Session Equipment Utilized During Treatment: Gait belt;Right knee  immobilizer Activity Tolerance: Patient tolerated treatment well Patient left: in chair;with call bell/phone within reach;with family/visitor present Nurse Communication: Mobility status         Time: 2595-6387 PT Time Calculation (min) (ACUTE ONLY): 28 min   Charges:   PT Evaluation $Initial PT Evaluation Tier I: 1 Procedure PT Treatments $Gait Training: 8-22 mins   PT G Codes:        Arieonna Medine October 27, 2014, 5:36 PM

## 2014-10-06 NOTE — Anesthesia Procedure Notes (Signed)
Spinal Patient location during procedure: OR Start time: 10/06/2014 7:20 AM End time: 10/06/2014 7:25 AM Reason for block: at surgeon's request Staffing Resident/CRNA: Anne Fu Performed by: resident/CRNA  Preanesthetic Checklist Completed: patient identified, site marked, surgical consent, pre-op evaluation, timeout performed, IV checked, risks and benefits discussed, monitors and equipment checked and at surgeon's request Spinal Block Patient position: sitting Approach: right paramedian Location: L2-3 Injection technique: single-shot Needle Needle type: Whitacre  Needle gauge: 25 G Needle length: 9 cm Assessment Sensory level: T6 Additional Notes Expiration date of kit checked and confirmed. Patient tolerated procedure well, without complications. X 1 attempt with noted clear CSF return. Loss of motor and sensory on exam post injection.

## 2014-10-06 NOTE — H&P (View-Only) (Signed)
Philip Gates DOB: 07/06/1956 Married / Language: English / Race: White Male Date of Admission:  10/06/2014 CC:  Right Knee Pain History of Present Illness  The patient is a 59 year old male who comes in for a preoperative History and Physical. The patient is scheduled for a right total knee arthroplasty to be performed by Dr. Dione Plover. Aluisio, MD at [facility] on 10-06-2014. The patient is being followed for their right knee pain and osteoarthritis. Symptoms reported today include: pain, instability and difficulty ambulating. The patient feels that they are doing poorly. The following medication has been used for pain control: antiinflammatory medication (Advil). He is ready to proceed with the right total knee arthroplasty. He feels like the hamstring pain is slowly getting better with the ice and stretching. They have been treated conservatively in the past for the above stated problem and despite conservative measures, they continue to have progressive pain and severe functional limitations and dysfunction. They have failed non-operative management including home exercise, medications. It is felt that they would benefit from undergoing total joint replacement. Risks and benefits of the procedure have been discussed with the patient and they elect to proceed with surgery. There are no active contraindications to surgery such as ongoing infection or rapidly progressive neurological disease.  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 Aspirin *ANALGESICS - NonNarcotic* interaction with current medication so avoids aspirin  Family History Rheumatoid Arthritis grandmother fathers side Cancer father and grandfather mothers side Cerebrovascular Accident grandfather fathers side grandmother fathers side and grandfather fathers side Heart disease in male family member before age 60 Hypertension mother Heart Disease Mother. mother Diabetes Mellitus father father and grandfather fathers side  Social History Pain Contract no Illicit drug use no Tobacco / smoke exposure yes Alcohol use current drinker; drinks beer Tobacco use Former smoker. former smoker; smoke(d) less than 1/2 pack(s) per day; uses 1 1/2 can(s) smokeless per week former smoker; smoke(d) less than 1/2 pack(s) per day; uses 2 or more can(s) smokeless per week Number of flights of stairs before winded greater than 5 Drug/Alcohol Rehab (Previously) no Drug/Alcohol Rehab (Currently) no Exercise Exercises weekly; does individual sport Exercises weekly; does running / walking, individual sport and other Marital status married Living situation live with spouse Current work status working full time Children 2  Medication History Xopenex HFA (45MCG/ACT Aerosol, Training and development officer) Active. Testosterone Cypionate (200MG /ML Oil, Intramuscular) Active. Montelukast Sodium (10MG  Tablet, Oral) Active. LamoTRIgine (200MG  Tablet, Oral) Active. Ipratropium Bromide (0.03% Solution, Nasal) Active. Depakote ER (500MG  Tablet ER 24HR, Oral) Active. Dexilant (60MG  Capsule DR, Oral every morning) Active. Cialis (20MG  Tablet, Oral) Active. Aciphex (20MG  Tablet DR, Oral at bedtime) Active. Advil (200MG  Tablet, Oral) Active. Vitamin C (Oral) Active. Vyvanse (20MG  Capsule, Oral) Active. Jalyn (0.5-0.4MG  Capsule, Oral) Active.  Past Surgical History Total Hip Replacement - Left Anterior Approach Arthroscopy of Knee right Sinus  Surgery Straighten Nasal Septum   Review of Systems General Not Present- Chills, Fatigue, Fever, Memory Loss, Night Sweats, Weight Gain and Weight Loss. Skin Not Present- Eczema, Hives, Itching, Lesions and Rash. HEENT Not Present- Dentures, Double Vision, Headache, Hearing Loss, Tinnitus and Visual Loss. Respiratory Not  Present- Allergies, Chronic Cough, Coughing up blood, Shortness of breath at rest and Shortness of breath with exertion. Cardiovascular Not Present- Chest Pain, Difficulty Breathing Lying Down, Murmur, Palpitations, Racing/skipping heartbeats and Swelling. Gastrointestinal Not Present- Abdominal Pain, Bloody Stool, Constipation, Diarrhea, Difficulty Swallowing, Heartburn, Jaundice, Loss of appetitie, Nausea and Vomiting. Male Genitourinary Not Present- Blood in Urine, Discharge, Flank Pain, Incontinence, Painful Urination, Urgency, Urinary frequency, Urinary Retention, Urinating at Night and Weak urinary stream. Musculoskeletal Present- Joint Pain. Not Present- Back Pain, Joint Swelling, Morning Stiffness, Muscle Pain, Muscle Weakness and Spasms. Neurological Not Present- Blackout spells, Difficulty with balance, Dizziness, Paralysis, Tremor and Weakness. Psychiatric Not Present- Insomnia.   Vitals Weight: 200 lb Height: 71in Weight was reported by patient. Height was reported by patient. Body Surface Area: 2.11 m Body Mass Index: 27.89 kg/m  BP: 138/78 (Sitting, Right Arm, Standard)   Physical Exam  General Mental Status -Alert, cooperative and good historian. General Appearance-pleasant, Not in acute distress. Orientation-Oriented X3. Build & Nutrition-Well nourished and Well developed.  Head and Neck Head-normocephalic, atraumatic . Neck Global Assessment - supple, no bruit auscultated on the right, no bruit auscultated on the left. Note: upper partial denture plate   Eye Pupil - Bilateral-Regular and Round. Motion -  Bilateral-EOMI.  Chest and Lung Exam Auscultation Breath sounds - clear at anterior chest wall and clear at posterior chest wall. Adventitious sounds - No Adventitious sounds.  Cardiovascular Auscultation Rhythm - Regular rate and rhythm. Heart Sounds - S1 WNL and S2 WNL. Murmurs & Other Heart Sounds - Auscultation of the heart reveals - No Murmurs.  Abdomen Palpation/Percussion Tenderness - Abdomen is non-tender to palpation. Rigidity (guarding) - Abdomen is soft. Auscultation Auscultation of the abdomen reveals - Bowel sounds normal.  Male Genitourinary Note: Not done, not pertinent to present illness   Musculoskeletal Note: General: He is alert and oriented, no apparent distress.  Extremities: His right knee shows no effusion. His range is about 5 to 125. He has got marked crepitus on range of motion. He is tender medial greater than lateral with no instability noted. Left knee exam is unremarkable.  X-RAYS: He has severe bone on patellofemoral with large osteophytes and has significant medial joint space narrowing with osteophytes.   Assessment & Plan Osteoarthritis of right knee (M17.9) Note:Plan is for a Right Total Knee Replacement by Dr. Wynelle Link.  Plan is to go home.  PCP - Dr. Wende Neighbors CV - Dr. Consuella Lose [facility] - Patient was seen and cleared for upcoming surgery. "Maintain normotension especially during induction and emergence.  Proceed with knee replacement as scheduled, with special attention to management of operative and perioperative blood pressure.  Once his knee replacement is completed, we will schedule diagnostic cerebral angiography a few weeks later for further workup and characterization of the aneurysm."  Topical TXA - Vascular Disease.  Please note that the patient had some urinary retention after previous hip replacement.  Signed electronically by Gearlean Alf, MD

## 2014-10-07 LAB — CBC
HEMATOCRIT: 36 % — AB (ref 39.0–52.0)
Hemoglobin: 11.9 g/dL — ABNORMAL LOW (ref 13.0–17.0)
MCH: 29.2 pg (ref 26.0–34.0)
MCHC: 33.1 g/dL (ref 30.0–36.0)
MCV: 88.2 fL (ref 78.0–100.0)
Platelets: 206 10*3/uL (ref 150–400)
RBC: 4.08 MIL/uL — ABNORMAL LOW (ref 4.22–5.81)
RDW: 14.7 % (ref 11.5–15.5)
WBC: 10.5 10*3/uL (ref 4.0–10.5)

## 2014-10-07 LAB — BASIC METABOLIC PANEL
ANION GAP: 8 (ref 5–15)
BUN: 6 mg/dL (ref 6–23)
CALCIUM: 9.2 mg/dL (ref 8.4–10.5)
CO2: 28 mmol/L (ref 19–32)
Chloride: 103 mmol/L (ref 96–112)
Creatinine, Ser: 0.79 mg/dL (ref 0.50–1.35)
GLUCOSE: 118 mg/dL — AB (ref 70–99)
POTASSIUM: 4.2 mmol/L (ref 3.5–5.1)
Sodium: 139 mmol/L (ref 135–145)

## 2014-10-07 NOTE — Progress Notes (Signed)
CARE MANAGEMENT NOTE 10/07/2014  Patient:  Philip Gates, Philip Gates   Account Number:  [id number]  Date Initiated:  10/07/2014  Documentation initiated by:  Dessa Phi  Subjective/Objective Assessment:   59 y/o m admitted w/OA R Knee.     Action/Plan:   From home.   Anticipated DC Date:  10/08/2014   Anticipated DC Plan:  [facility]  CM consult      Choice offered to / List presented to:  C-1 Patient        [place] arranged  HH-2 PT      [facility] agency  [facility]   Status of service:  In process, will continue to follow Medicare Important Message given?   (If response is "NO", the following Medicare IM given date fields will be blank) Date Medicare IM given:   Medicare IM given by:   Date Additional Medicare IM given:   Additional Medicare IM given by:    Discharge Disposition:    Per UR Regulation:    If discussed at Long Length of Stay Meetings, dates discussed:    Comments:  10/07/14 Dessa Phi RN BSN NCM [phone] Patient chose [facility].TC [facility] tel#[phone],spoke to [place].Faxed w/confirmation to [phone]-HHPT order,face sheet,h&p(patient only wants [facility]).Await d/c order to fax to [facility].

## 2014-10-07 NOTE — Progress Notes (Signed)
Physical Therapy Treatment Patient Details Name: Philip Gates MRN: [id number] DOB: 06/04/1956 Today's Date: 10/07/2014    History of Present Illness R TKR    PT Comments    Motivated and progressing well with mobility.  Follow Up Recommendations  Home health PT     Equipment Recommendations  None recommended by PT    Recommendations for Other Services OT consult     Precautions / Restrictions Precautions Precautions: Knee;Fall Required Braces or Orthoses: Knee Immobilizer - Right Knee Immobilizer - Right: Discontinue once straight leg raise with < 10 degree lag Restrictions Weight Bearing Restrictions: No Other Position/Activity Restrictions: WBAT    Mobility  Bed Mobility               General bed mobility comments: NT - pt up in recliner and prefers to stay OOB  Transfers Overall transfer level: Needs assistance Equipment used: Rolling walker (2 wheeled) Transfers: Sit to/from Stand Sit to Stand: Min guard         General transfer comment: cues for LE management and use of UEs to self assist  Ambulation/Gait Ambulation/Gait assistance: Min assist;Min guard Ambulation Distance (Feet): 111 Feet Assistive device: Rolling walker (2 wheeled) Gait Pattern/deviations: Step-to pattern;Decreased step length - right;Decreased step length - left;Shuffle;Trunk flexed Gait velocity: decr   General Gait Details: cues for sequence, posture and position from [employer]            Wheelchair Mobility    Modified Rankin (Stroke Patients Only)       Balance                                    Cognition Arousal/Alertness: Awake/alert Behavior During Therapy: WFL for tasks assessed/performed Overall Cognitive Status: Within Functional Limits for tasks assessed                      Exercises Total Joint Exercises Ankle Circles/Pumps: AROM;Both;15 reps;Supine Quad Sets: AROM;Both;10 reps;Supine Heel Slides: AAROM;Right;10  reps;Supine Straight Leg Raises: AAROM;Right;10 reps;Supine Goniometric ROM: AAROM R knee -10 - 40    General Comments        Pertinent Vitals/Pain Pain Assessment: 0-10 Pain Score: 5  Pain Location: R knee Pain Descriptors / Indicators: Aching;Sore Pain Intervention(s): Limited activity within patient's tolerance;Monitored during session;Premedicated before session;Ice applied    Home Living                      Prior Function            PT Goals (current goals can now be found in the care plan section) Acute Rehab PT Goals Patient Stated Goal: Resume previous lifestyle with decreased pain PT Goal Formulation: With patient Time For Goal Achievement: 10/13/14 Potential to Achieve Goals: Good Progress towards PT goals: Progressing toward goals    Frequency  7X/week    PT Plan Current plan remains appropriate    Co-evaluation             End of Session Equipment Utilized During Treatment: Gait belt;Right knee immobilizer Activity Tolerance: Patient tolerated treatment well Patient left: in chair;with call bell/phone within reach;with family/visitor present     Time: 0830-0905 PT Time Calculation (min) (ACUTE ONLY): 35 min  Charges:  $Gait Training: 8-22 mins $Therapeutic Exercise: 8-22 mins  G Codes:      Darshawn Boateng 10/16/14, 9:25 AM

## 2014-10-07 NOTE — Discharge Instructions (Addendum)
° °Dr. Frank Aluisio °Total Joint Specialist °[facility] °[address] °[place], [place] [address] °[phone] ° °TOTAL KNEE REPLACEMENT POSTOPERATIVE DIRECTIONS ° ° ° °Knee Rehabilitation, Guidelines Following Surgery  °Results after knee surgery are often greatly improved when you follow the exercise, range of motion and muscle strengthening exercises prescribed by your doctor. Safety measures are also important to protect the knee from further injury. Any time any of these exercises cause you to have increased pain or swelling in your knee joint, decrease the amount until you are comfortable again and slowly increase them. If you have problems or questions, call your caregiver or physical therapist for advice.  ° °HOME CARE INSTRUCTIONS  °Remove items at home which could result in a fall. This includes throw rugs or furniture in walking pathways.  °Continue medications as instructed at time of discharge. °You may have some home medications which will be placed on hold until you complete the course of blood thinner medication.  °You may start showering once you are discharged home but do not submerge the incision under water. Just pat the incision dry and apply a dry gauze dressing on daily. °Walk with walker as instructed.  °You may resume a sexual relationship in one month or when given the OK by  your doctor.  °· Use walker as long as suggested by your caregivers. °· Avoid periods of inactivity such as sitting longer than an hour when not asleep. This helps prevent blood clots.  °You may put full weight on your legs and walk as much as is comfortable.  °You may return to work once you are cleared by your doctor.  °Do not drive a car for 6 weeks or until released by you surgeon.  °· Do not drive while taking narcotics.  °Wear the elastic stockings for three weeks following surgery during the day but you may remove then at night. °Make sure you keep all of your appointments after your  operation with all of your doctors and caregivers. You should call the office at the above phone number and make an appointment for approximately two weeks after the date of your surgery. °Change the dressing daily and reapply a dry dressing each time. °Please pick up a stool softener and laxative for home use as long as you are requiring pain medications. °· ICE to the affected knee every three hours for 30 minutes at a time and then as needed for pain and swelling.  Continue to use ice on the knee for pain and swelling from surgery. You may notice swelling that will progress down to the foot and ankle.  This is normal after surgery.  Elevate the leg when you are not up walking on it.   °It is important for you to complete the blood thinner medication as prescribed by your doctor. °· Continue to use the breathing machine which will help keep your temperature down.  It is common for your temperature to cycle up and down following surgery, especially at night when you are not up moving around and exerting yourself.  The breathing machine keeps your lungs expanded and your temperature down. ° °RANGE OF MOTION AND STRENGTHENING EXERCISES  °Rehabilitation of the knee is important following a knee injury or an operation. After just a few days of immobilization, the muscles of the thigh which control the knee become weakened and shrink (atrophy). Knee exercises are designed to build up the tone and strength of the thigh muscles and to improve knee   motion. Often times heat used for twenty to thirty minutes before working out will loosen up your tissues and help with improving the range of motion but do not use heat for the first two weeks following surgery. These exercises can be done on a training (exercise) mat, on the floor, on a table or on a bed. Use what ever works the best and is most comfortable for you Knee exercises include:  °Leg Lifts - While your knee is still immobilized in a splint or cast, you can do  straight leg raises. Lift the leg to 60 degrees, hold for 3 sec, and slowly lower the leg. Repeat 10-20 times 2-3 times daily. Perform this exercise against resistance later as your knee gets better.  °Quad and Hamstring Sets - Tighten up the muscle on the front of the thigh (Quad) and hold for 5-10 sec. Repeat this 10-20 times hourly. Hamstring sets are done by pushing the foot backward against an object and holding for 5-10 sec. Repeat as with quad sets.  °A rehabilitation program following serious knee injuries can speed recovery and prevent re-injury in the future due to weakened muscles. Contact your doctor or a physical therapist for more information on knee rehabilitation.  ° °SKILLED REHAB INSTRUCTIONS: °If the patient is transferred to a skilled rehab facility following release from the hospital, a list of the current medications will be sent to the facility for the patient to continue.  When discharged from the skilled rehab facility, please have the facility set up the patient's Home Health Physical Therapy prior to being released. Also, the skilled facility will be responsible for providing the patient with their medications at time of release from the facility to include their pain medication, the muscle relaxants, and their blood thinner medication. If the patient is still at the rehab facility at time of the two week follow up appointment, the skilled rehab facility will also need to assist the patient in arranging follow up appointment in our office and any transportation needs. ° °MAKE SURE YOU:  °Understand these instructions.  °Will watch your condition.  °Will get help right away if you are not doing well or get worse.  ° ° °Pick up stool softner and laxative for home use following surgery while on pain medications. °Do not submerge incision under water. °Please use good hand washing techniques while changing dressing each day. °May shower starting three days after surgery. °Please use a clean  towel to pat the incision dry following showers. °Continue to use ice for pain and swelling after surgery. °Do not use any lotions or creams on the incision until instructed by your surgeon. ° °Take Xarelto for two and a half more weeks, then discontinue Xarelto. °Once the patient has completed the blood thinner regimen, then take a Baby 81 mg Aspirin daily for three more weeks. ° °Postoperative Constipation Protocol ° °Constipation - defined medically as fewer than three stools per week and severe constipation as less than one stool per week. ° °One of the most common issues patients have following surgery is constipation.  Even if you have a regular bowel pattern at home, your normal regimen is likely to be disrupted due to multiple reasons following surgery.  Combination of anesthesia, postoperative narcotics, change in appetite and fluid intake all can affect your bowels.  In order to avoid complications following surgery, here are some recommendations in order to help you during your recovery period. ° °Colace (docusate) - Pick up an over-the-counter form of   Colace or another stool softener and take twice a day as long as you are requiring postoperative pain medications.  Take with a full glass of water daily.  If you experience loose stools or diarrhea, hold the colace until you stool forms back up.  If your symptoms do not get better within 1 week or if they get worse, check with your doctor.  Dulcolax (bisacodyl) - Pick up over-the-counter and take as directed by the product packaging as needed to assist with the movement of your bowels.  Take with a full glass of water.  Use this product as needed if not relieved by Colace only.   MiraLax (polyethylene glycol) - Pick up over-the-counter to have on hand.  MiraLax is a solution that will increase the amount of water in your bowels to assist with bowel movements.  Take as directed and can mix with a glass of water, juice, soda, coffee, or tea.  Take if you  go more than two days without a movement. Do not use MiraLax more than once per day. Call your doctor if you are still constipated or irregular after using this medication for 7 days in a row.  If you continue to have problems with postoperative constipation, please contact the office for further assistance and recommendations.  If you experience "the worst abdominal pain ever" or develop nausea or vomiting, please contact the office immediatly for further recommendations for treatment.   Information on my medicine - XARELTO (Rivaroxaban)  This medication education was reviewed with me or my healthcare representative as part of my discharge preparation.  The pharmacist that spoke with me during my hospital stay was:  Clovis Riley, [facility]  Why was Xarelto prescribed for you? Xarelto was prescribed for you to reduce the risk of blood clots forming after orthopedic surgery. The medical term for these abnormal blood clots is venous thromboembolism (VTE).  What do you need to know about xarelto ? Take your Xarelto ONCE DAILY at the same time every day. You may take it either with or without food.  If you have difficulty swallowing the tablet whole, you may crush it and mix in applesauce just prior to taking your dose.  Take Xarelto exactly as prescribed by your doctor and DO NOT stop taking Xarelto without talking to the doctor who prescribed the medication.  Stopping without other VTE prevention medication to take the place of Xarelto may increase your risk of developing a clot.  After discharge, you should have regular check-up appointments with your healthcare provider that is prescribing your Xarelto.    What do you do if you miss a dose? If you miss a dose, take it as soon as you remember on the same day then continue your regularly scheduled once daily regimen the next day. Do not take two doses of Xarelto on the same day.   Important Safety Information A possible side  effect of Xarelto is bleeding. You should call your healthcare provider right away if you experience any of the following: ? Bleeding from an injury or your nose that does not stop. ? Unusual colored urine (red or dark brown) or unusual colored stools (red or black). ? Unusual bruising for unknown reasons. ? A serious fall or if you hit your head (even if there is no bleeding).  Some medicines may interact with Xarelto and might increase your risk of bleeding while on Xarelto. To help avoid this, consult your healthcare provider or pharmacist prior to using any new  prescription or non-prescription medications, including herbals, vitamins, non-steroidal anti-inflammatory drugs (NSAIDs) and supplements.  This website has more information on Xarelto: [email or web address]

## 2014-10-07 NOTE — Progress Notes (Signed)
   Subjective: 1 Day Post-Op Procedure(s) (LRB): RIGHT TOTAL KNEE ARTHROPLASTY (Right) Patient reports pain as mild.   Patient is well, and has had no acute complaints or problems. He did get little sleep last night due to discomfort but it feeling some better now. No SOB or chest pain.  We will start therapy today.  Plan is to go Home after hospital stay.  Objective: Vital signs in last 24 hours: Temp:  [97.6 F (36.4 C)-98.6 F (37 C)] 98.6 F (37 C) (01/30 0606) Pulse Rate:  [76-108] 76 (01/30 0606) Resp:  [12-20] 16 (01/30 0606) BP: (129-177)/(83-100) 134/83 mmHg (01/30 0606) SpO2:  [93 %-100 %] 97 % (01/30 0606)  Intake/Output from previous day:  Intake/Output Summary (Last 24 hours) at 10/07/14 0924 Last data filed at 10/07/14 0515  Gross per 24 hour  Intake 3576.66 ml  Output   6188 ml  Net -2611.34 ml     Labs:  Recent Labs  10/07/14 0500  HGB 11.9*    Recent Labs  10/07/14 0500  WBC 10.5  RBC 4.08*  HCT 36.0*  PLT 206    Recent Labs  10/07/14 0500  NA 139  K 4.2  CL 103  CO2 28  BUN 6  CREATININE 0.79  GLUCOSE 118*  CALCIUM 9.2    EXAM General - Patient is Alert and Oriented Extremity - Neurologically intact Intact pulses distally Dorsiflexion/Plantar flexion intact Compartment soft Dressing - dressing C/D/I Motor Function - intact, moving foot and toes well on exam.  Hemovac pulled without difficulty.  Past Medical History  Diagnosis Date  . Asthma     ACTIVITY INDUCED  . Sinus drainage   . Arthritis   . GERD (gastroesophageal reflux disease)   . Bipolar 1 disorder   . Neck pain, chronic   . Cellulitis 05/2014    right side face  . 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 BPH (benign prostatic hyperplasia)     Assessment/Plan: 1 Day Post-Op Procedure(s) (LRB): RIGHT TOTAL KNEE ARTHROPLASTY (Right) Principal Problem:   OA (osteoarthritis) of knee  Estimated body mass index is 28.33  kg/(m^2) as calculated from the following:   Height as of this encounter: 5\' 11"  (1.803 m).   Weight as of this encounter: 92.08 kg (203 lb). Advance diet Up with therapy D/C IV fluids when tolerating POs well  DVT Prophylaxis - Xarelto Weight-Bearing as tolerated  D/C O2 and Pulse OX and try on Room Air  He is doing well. Walked with therapy already and did great. Plan for DC home tomorrow with HHPT.   Ardeen Jourdain, PA-C Orthopaedic Surgery 10/07/2014, 9:24 AM

## 2014-10-07 NOTE — Progress Notes (Signed)
Physical Therapy Treatment Patient Details Name: AEDYN KEMPFER MRN: [id number] DOB: [birth year] Today's Date: 10/07/2014    History of Present Illness R TKR    PT Comments    Steady progress with mobility.  Follow Up Recommendations  Home health PT     Equipment Recommendations  None recommended by PT    Recommendations for Other Services OT consult     Precautions / Restrictions Precautions Precautions: Knee;Fall Required Braces or Orthoses: Knee Immobilizer - Right Knee Immobilizer - Right: Discontinue once straight leg raise with < 10 degree lag Restrictions Weight Bearing Restrictions: No Other Position/Activity Restrictions: WBAT    Mobility  Bed Mobility Overal bed mobility: Needs Assistance Bed Mobility: Sit to Supine       Sit to supine: Min guard   General bed mobility comments: min cues for sequence and use of L LE to self assist  Transfers Overall transfer level: Needs assistance Equipment used: Rolling walker (2 wheeled) Transfers: Sit to/from Stand Sit to Stand: Min guard         General transfer comment: verbal cues for hand placement.  Ambulation/Gait Ambulation/Gait assistance: Min guard Ambulation Distance (Feet): 200 Feet Assistive device: Rolling walker (2 wheeled) Gait Pattern/deviations: Step-to pattern;Decreased step length - right;Decreased step length - left;Shuffle;Trunk flexed Gait velocity: decr   General Gait Details: cues for sequence, posture and position from [employer]            Wheelchair Mobility    Modified Rankin (Stroke Patients Only)       Balance                                    Cognition Arousal/Alertness: Awake/alert Behavior During Therapy: WFL for tasks assessed/performed Overall Cognitive Status: Within Functional Limits for tasks assessed                      Exercises      General Comments        Pertinent Vitals/Pain Pain Assessment: 0-10 Pain Score:  5  Pain Location: R knee Pain Descriptors / Indicators: Aching;Sore Pain Intervention(s): Limited activity within patient's tolerance;Monitored during session;Premedicated before session;Ice applied    Home Living Family/patient expects to be discharged to:: Private residence Living Arrangements: Spouse/significant other Available Help at Discharge: Family Type of Home: House Home Access: Stairs to enter Entrance Stairs-Rails: Left Home Layout: One level Home Equipment: Bedside commode;Shower seat - built in      Prior Function Level of Independence: Independent          PT Goals (current goals can now be found in the care plan section) Acute Rehab PT Goals Patient Stated Goal: return to independence PT Goal Formulation: With patient Time For Goal Achievement: 10/13/14 Potential to Achieve Goals: Good Progress towards PT goals: Progressing toward goals    Frequency  7X/week    PT Plan Current plan remains appropriate    Co-evaluation             End of Session Equipment Utilized During Treatment: Gait belt;Right knee immobilizer Activity Tolerance: Patient tolerated treatment well Patient left: in bed;with call bell/phone within reach;with family/visitor present     Time: 5427-0623 PT Time Calculation (min) (ACUTE ONLY): 18 min  Charges:  $Gait Training: 8-22 mins                    G Codes:  Riyad Keena 10/07/2014, 4:53 PM

## 2014-10-07 NOTE — Evaluation (Signed)
Occupational Therapy Evaluation Patient Details Name: Philip Gates MRN: [id number] DOB: [birth year] Today's Date: 10/07/2014    History of Present Illness R TKR   Clinical Impression   Pt up to practice Lb dressing and shower transfer. Wife will be assisting at d/c PRN. Will follow while on acute to progress safety and independence with self care tasks.     Follow Up Recommendations  No OT follow up;Supervision/Assistance - 24 hour    Equipment Recommendations  None recommended by OT    Recommendations for Other Services       Precautions / Restrictions Precautions Precautions: Knee;Fall Required Braces or Orthoses: Knee Immobilizer - Right Knee Immobilizer - Right: Discontinue once straight leg raise with < 10 degree lag Restrictions Weight Bearing Restrictions: No Other Position/Activity Restrictions: WBAT      Mobility Bed Mobility                  Transfers Overall transfer level: Needs assistance Equipment used: Rolling walker (2 wheeled) Transfers: Sit to/from Stand Sit to Stand: Min guard         General transfer comment: verbal cues for hand placement.    Balance                                            ADL Overall ADL's : Needs assistance/impaired Eating/Feeding: Independent;Sitting   Grooming: Wash/dry hands;Set up;Sitting   Upper Body Bathing: Set up;Sitting   Lower Body Bathing: Minimal assistance;Sit to/from stand   Upper Body Dressing : Set up;Sitting   Lower Body Dressing: Minimal assistance;Sit to/from stand   Toilet Transfer: Minimal assistance;Ambulation;RW   Toileting- Clothing Manipulation and Hygiene: Minimal assistance;Sit to/from stand   Tub/ Shower Transfer: Walk-in shower;Minimal assistance;Rolling walker     General ADL Comments: Wife present for session. Pt has a 3in1 from previous surgery. He was able to reach down and don/doff R sock but with some effort and discomfort. Educated on AE  options if desired. Pt may consider reacher option.  Practiced shower transfer and discussed at length arrangement of 3in1 in shower and use of walker to step in and bring walker in if there is room versus have 3in1 placed toward the front of the shower. Pt verbalizes understanding of different options for safety depending on his space. Wife educated also. Educated on use of KI when up until able to SLR. Also educated on sequence and safety for LB dressing and having walker in front of him when he stands to manage clothing.      Vision                     Perception     Praxis      Pertinent Vitals/Pain Pain Assessment: 0-10 Pain Score: 6  Pain Location: R knee Pain Descriptors / Indicators: Aching Pain Intervention(s): Repositioned;Ice applied     Hand Dominance Right   Extremity/Trunk Assessment Upper Extremity Assessment Upper Extremity Assessment: Overall WFL for tasks assessed           Communication Communication Communication: No difficulties   Cognition Arousal/Alertness: Awake/alert Behavior During Therapy: WFL for tasks assessed/performed Overall Cognitive Status: Within Functional Limits for tasks assessed                     General Comments       Exercises  Shoulder Instructions      Home Living Family/patient expects to be discharged to:: Private residence Living Arrangements: Spouse/significant other Available Help at Discharge: Family Type of Home: House Home Access: Stairs to enter Technical brewer of Steps: 4 Entrance Stairs-Rails: Left Home Layout: One level     Bathroom Shower/Tub: Occupational psychologist: [place]: Bedside commode;Shower seat - built in          Prior Functioning/Environment Level of Independence: Independent             OT Diagnosis: Generalized weakness   OT Problem List: Decreased strength;Decreased knowledge of use of DME or AE   OT  Treatment/Interventions: Self-care/ADL training;Patient/family education;Therapeutic activities;DME and/or AE instruction    OT Goals(Current goals can be found in the care plan section) Acute Rehab OT Goals Patient Stated Goal: return to independence OT Goal Formulation: With patient/family Time For Goal Achievement: 10/14/14 Potential to Achieve Goals: Good  OT Frequency: Min 2X/week   Barriers to D/C:            Co-evaluation              End of Session Equipment Utilized During Treatment: Rolling walker  Activity Tolerance: Patient tolerated treatment well Patient left: in chair;with call bell/phone within reach;with family/visitor present   Time: 1135-1200 OT Time Calculation (min): 25 min Charges:  OT General Charges $OT Visit: 1 Procedure OT Evaluation $Initial OT Evaluation Tier I: 1 Procedure OT Treatments $Therapeutic Activity: 8-22 mins G-Codes:    Lonza, [place]  [phone] 10/07/2014, 1:50 PM

## 2014-10-07 NOTE — Plan of Care (Signed)
Problem: Phase III Progression Outcomes Goal: Anticoagulant follow-up in place Outcome: Not Applicable Date Met:  62/83/66 xarelto

## 2014-10-08 LAB — BASIC METABOLIC PANEL WITH GFR
Anion gap: 8 (ref 5–15)
BUN: 10 mg/dL (ref 6–23)
CO2: 30 mmol/L (ref 19–32)
Calcium: 10 mg/dL (ref 8.4–10.5)
Chloride: 100 mmol/L (ref 96–112)
Creatinine, Ser: 0.69 mg/dL (ref 0.50–1.35)
GFR calc Af Amer: >90 mL/min (ref >90)
GFR calc non Af Amer: >90 mL/min (ref >90)
Glucose, Bld: 105 mg/dL — ABNORMAL HIGH (ref 70–99)
Potassium: 3.9 mmol/L (ref 3.5–5.1)
Sodium: 138 mmol/L (ref 135–145)

## 2014-10-08 LAB — CBC
HEMATOCRIT: 35.4 % — AB (ref 39.0–52.0)
Hemoglobin: 11.7 g/dL — ABNORMAL LOW (ref 13.0–17.0)
MCH: 29.2 pg (ref 26.0–34.0)
MCHC: 33.1 g/dL (ref 30.0–36.0)
MCV: 88.3 fL (ref 78.0–100.0)
Platelets: 204 10*3/uL (ref 150–400)
RBC: 4.01 MIL/uL — AB (ref 4.22–5.81)
RDW: 14.9 % (ref 11.5–15.5)
WBC: 11.8 10*3/uL — AB (ref 4.0–10.5)

## 2014-10-08 NOTE — Progress Notes (Signed)
Physical Therapy Treatment Patient Details Name: Philip Gates MRN: [id number] DOB: October 11, 1955 Today's Date: 10/08/2014    History of Present Illness R TKR    PT Comments    Progressing well this pm with mobility.  Pt reports better pain control.  Reviewed stairs and don/doff KI with pt and spouse.  Follow Up Recommendations  Home health PT     Equipment Recommendations  None recommended by PT    Recommendations for Other Services OT consult     Precautions / Restrictions Precautions Precautions: Knee;Fall Required Braces or Orthoses: Knee Immobilizer - Right Knee Immobilizer - Right: Discontinue once straight leg raise with < 10 degree lag Restrictions Weight Bearing Restrictions: No Other Position/Activity Restrictions: WBAT    Mobility  Bed Mobility Overal bed mobility: Needs Assistance Bed Mobility: Supine to Sit;Sit to Supine     Supine to sit: Min guard Sit to supine: Supervision   General bed mobility comments: min cues for sequence and use of L LE to self assist  Transfers Overall transfer level: Needs assistance Equipment used: Rolling walker (2 wheeled) Transfers: Sit to/from Stand Sit to Stand: Min guard         General transfer comment: verbal cues for hand placement.  Ambulation/Gait Ambulation/Gait assistance: Min guard;Supervision Ambulation Distance (Feet): 100 Feet Assistive device: Rolling walker (2 wheeled) Gait Pattern/deviations: Step-to pattern;Decreased step length - right;Decreased step length - left;Shuffle;Trunk flexed Gait velocity: decr   General Gait Details: cues for sequence, posture and position from RW   Stairs Stairs: Yes Stairs assistance: Min assist Stair Management: One rail Right;Step to pattern;Forwards;With crutches Number of Stairs: 4 General stair comments: cues for sequence and foot/crutch placement  Wheelchair Mobility    Modified Rankin (Stroke Patients Only)       Balance                                    Cognition Arousal/Alertness: Awake/alert Behavior During Therapy: WFL for tasks assessed/performed Overall Cognitive Status: Within Functional Limits for tasks assessed                      Exercises      General Comments        Pertinent Vitals/Pain Pain Assessment: 0-10 Pain Score: 5  Pain Location: R knee Pain Descriptors / Indicators: Aching;Sore Pain Intervention(s): Limited activity within patient's tolerance;Monitored during session;Premedicated before session;Ice applied    Home Living                      Prior Function            PT Goals (current goals can now be found in the care plan section) Acute Rehab PT Goals Patient Stated Goal: return to independence PT Goal Formulation: With patient Time For Goal Achievement: 10/13/14 Potential to Achieve Goals: Good Progress towards PT goals: Progressing toward goals    Frequency  7X/week    PT Plan Current plan remains appropriate    Co-evaluation             End of Session Equipment Utilized During Treatment: Gait belt;Right knee immobilizer Activity Tolerance: Patient tolerated treatment well Patient left: in bed;with call bell/phone within reach;with family/visitor present     Time: 1405-1440 PT Time Calculation (min) (ACUTE ONLY): 35 min  Charges:  $Gait Training: 8-22 mins $Therapeutic Activity: 8-22 mins  G Codes:      Tennile Styles 10/14/14, 4:00 PM

## 2014-10-08 NOTE — Progress Notes (Signed)
   Subjective: 2 Days Post-Op Procedure(s) (LRB): RIGHT TOTAL KNEE ARTHROPLASTY (Right)  Pt c/o moderate pain and burning to right tibia especially with exercises and standing Therapy exercises have been difficult Denies any fevers or chills Patient reports pain as moderate.  Objective:   VITALS:   Filed Vitals:   10/08/14 0602  BP: 146/88  Pulse: 96  Temp: 98.2 F (36.8 C)  Resp: 18    Right knee incision healing well Dressing changed No rashes or erythema No drainage  LABS  Recent Labs  10/07/14 0500 10/08/14 0457  HGB 11.9* 11.7*  HCT 36.0* 35.4*  WBC 10.5 11.8*  PLT 206 204     Recent Labs  10/07/14 0500 10/08/14 0457  NA 139 138  K 4.2 3.9  BUN 6 10  CREATININE 0.79 0.69  GLUCOSE 118* 105*     Assessment/Plan: 2 Days Post-Op Procedure(s) (LRB): RIGHT TOTAL KNEE ARTHROPLASTY (Right) Continue PT/OT Pain management Possible d/c tomorrow if pain improves Encourage mobilization     [place], MPAS, PA-C  10/08/2014, 7:30 AM

## 2014-10-08 NOTE — Progress Notes (Signed)
Physical Therapy Treatment Patient Details Name: Philip Gates MRN: [id number] DOB: 25-Sep-1955 Today's Date: 11-07-2014    History of Present Illness R TKR    PT Comments    Slow progress this am with pt c/o increased pain with initial WB  Follow Up Recommendations  Home health PT     Equipment Recommendations  None recommended by PT    Recommendations for Other Services OT consult     Precautions / Restrictions Precautions Precautions: Knee;Fall Required Braces or Orthoses: Knee Immobilizer - Right Knee Immobilizer - Right: Discontinue once straight leg raise with < 10 degree lag Restrictions Weight Bearing Restrictions: No Other Position/Activity Restrictions: WBAT    Mobility  Bed Mobility                  Transfers Overall transfer level: Needs assistance Equipment used: Rolling walker (2 wheeled) Transfers: Sit to/from Stand Sit to Stand: Min guard         General transfer comment: verbal cues for hand placement.  Ambulation/Gait Ambulation/Gait assistance: Min guard Ambulation Distance (Feet): 68 Feet Assistive device: Rolling walker (2 wheeled) Gait Pattern/deviations: Shuffle;Trunk flexed;Step-to pattern;Step-through pattern;Decreased step length - right;Decreased step length - left Gait velocity: decr   General Gait Details: cues for sequence, posture and position from [employer]            Wheelchair Mobility    Modified Rankin (Stroke Patients Only)       Balance                                    Cognition Arousal/Alertness: Awake/alert Behavior During Therapy: WFL for tasks assessed/performed Overall Cognitive Status: Within Functional Limits for tasks assessed                      Exercises Total Joint Exercises Ankle Circles/Pumps: AROM;Both;15 reps;Supine Quad Sets: AROM;Both;10 reps;Supine Heel Slides: AAROM;Right;Supine;15 reps Straight Leg Raises: AAROM;Right;10 reps;Supine     General Comments        Pertinent Vitals/Pain Pain Assessment: 0-10 Pain Score: 6  Pain Location: R knee/shin Pain Descriptors / Indicators: Aching;Burning Pain Intervention(s): Limited activity within patient's tolerance;Monitored during session;Premedicated before session;Ice applied    Home Living                      Prior Function            PT Goals (current goals can now be found in the care plan section) Acute Rehab PT Goals Patient Stated Goal: return to independence PT Goal Formulation: With patient Time For Goal Achievement: 10/13/14 Potential to Achieve Goals: Good Progress towards PT goals: Progressing toward goals    Frequency  7X/week    PT Plan Current plan remains appropriate    Co-evaluation             End of Session Equipment Utilized During Treatment: Gait belt;Right knee immobilizer Activity Tolerance: Patient tolerated treatment well;Patient limited by pain Patient left: in chair;with call bell/phone within reach;with family/visitor present     Time: 6568-1275 PT Time Calculation (min) (ACUTE ONLY): 35 min  Charges:  $Gait Training: 8-22 mins $Therapeutic Exercise: 8-22 mins                    G Codes:      Tildon Silveria 11/07/14, 11:47 AM

## 2014-10-08 NOTE — Progress Notes (Signed)
Held xarelto this am due to pt having brain aneurysm. Notified B.Dixon, PA, & he said to hold med until issue is addressed on Monday. Tersa Fotopoulos, [employer]

## 2014-10-09 ENCOUNTER — Encounter (HOSPITAL_COMMUNITY): Payer: Self-pay | Admitting: Orthopedic Surgery

## 2014-10-09 LAB — CBC
HEMATOCRIT: 35 % — AB (ref 39.0–52.0)
Hemoglobin: 11.4 g/dL — ABNORMAL LOW (ref 13.0–17.0)
MCH: 29.1 pg (ref 26.0–34.0)
MCHC: 32.6 g/dL (ref 30.0–36.0)
MCV: 89.3 fL (ref 78.0–100.0)
Platelets: 225 10*3/uL (ref 150–400)
RBC: 3.92 MIL/uL — AB (ref 4.22–5.81)
RDW: 15.2 % (ref 11.5–15.5)
WBC: 9.9 10*3/uL (ref 4.0–10.5)

## 2014-10-09 MED ORDER — METHOCARBAMOL 500 MG PO TABS: 500.0000 mg | ORAL_TABLET | Freq: Four times a day (QID) | ORAL | Status: DC | PRN

## 2014-10-09 MED ORDER — TRAMADOL HCL 50 MG PO TABS: 50.0000 mg | ORAL_TABLET | Freq: Four times a day (QID) | ORAL | Status: DC | PRN

## 2014-10-09 MED ORDER — OXYCODONE HCL 10 MG PO TABS: 10.0000 mg | ORAL_TABLET | ORAL | Status: DC | PRN

## 2014-10-09 MED ORDER — RIVAROXABAN 10 MG PO TABS: 10.0000 mg | ORAL_TABLET | Freq: Every day | ORAL | Status: DC

## 2014-10-09 NOTE — Progress Notes (Signed)
   Subjective: 3 Days Post-Op Procedure(s) (LRB): RIGHT TOTAL KNEE ARTHROPLASTY (Right) Patient reports pain as mild.   Patient seen in rounds by Dr. Wynelle Link. Patient is well, and has had no acute complaints or problems Patient is ready to go home  Objective: Vital signs in last 24 hours: Temp:  [97.9 F (36.6 C)-98.3 F (36.8 C)] 97.9 F (36.6 C) (02/01 0558) Pulse Rate:  [90-106] 90 (02/01 0558) Resp:  [18-20] 20 (02/01 0558) BP: (126-142)/(70-88) 142/88 mmHg (02/01 0558) SpO2:  [96 %-97 %] 96 % (02/01 0558)  Intake/Output from previous day:  Intake/Output Summary (Last 24 hours) at 10/09/14 0711 Last data filed at 10/09/14 0500  Gross per 24 hour  Intake   2280 ml  Output    750 ml  Net   1530 ml   Labs:  Recent Labs  10/07/14 0500 10/08/14 0457 10/09/14 0532  HGB 11.9* 11.7* 11.4*    Recent Labs  10/08/14 0457 10/09/14 0532  WBC 11.8* 9.9  RBC 4.01* 3.92*  HCT 35.4* 35.0*  PLT 204 225    Recent Labs  10/07/14 0500 10/08/14 0457  NA 139 138  K 4.2 3.9  CL 103 100  CO2 28 30  BUN 6 10  CREATININE 0.79 0.69  GLUCOSE 118* 105*  CALCIUM 9.2 10.0   No results for input(s): LABPT, INR in the last 72 hours.  EXAM: General - Patient is Alert and Appropriate Extremity - Neurovascular intact Sensation intact distally Incision - clean, dry, no drainage Motor Function - intact, moving foot and toes well on exam.   Assessment/Plan: 3 Days Post-Op Procedure(s) (LRB): RIGHT TOTAL KNEE ARTHROPLASTY (Right) Procedure(s) (LRB): RIGHT TOTAL KNEE ARTHROPLASTY (Right) Past Medical History  Diagnosis Date  . Asthma     ACTIVITY INDUCED  . Sinus drainage   . Arthritis   . GERD (gastroesophageal reflux disease)   . Bipolar 1 disorder   . Neck pain, chronic   . Cellulitis 05/2014    right side face  . 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 BPH (benign prostatic hyperplasia)    Principal Problem:   OA (osteoarthritis)  of knee  Estimated body mass index is 28.33 kg/(m^2) as calculated from the following:   Height as of this encounter: 5\' 11"  (1.803 m).   Weight as of this encounter: 92.08 kg (203 lb). Up with therapy Discharge home with home health Diet - Cardiac diet Follow up - in 2 weeks Activity - WBAT Disposition - Home Condition Upon Discharge - Good D/C Meds - See DC Summary DVT Prophylaxis - Xarelto  [name], PA-C Orthopaedic Surgery 10/09/2014, 7:11 AM

## 2014-10-09 NOTE — Progress Notes (Signed)
Pt to d/c home with [facility]. No DME needs. AVS reviewed and "My Chart" discussed with pt. Pt capable of verbalizing medications, dressing changes, signs and symptoms of infection, and follow-up appointments. Remains hemodynamically stable. No signs and symptoms of distress. Educated pt to return to ER in the case of SOB, dizziness, or chest pain.

## 2014-10-09 NOTE — Progress Notes (Signed)
Physical Therapy Treatment Patient Details Name: KEIGAN TAFOYA MRN: [id number] DOB: [birth year] Today's Date: 10/09/2014    History of Present Illness R TKR    PT Comments    POD # 3 assisted pt out of bathroom back to bed to perform TKR TE's.  Pt was unable to complete all due to MAX c/o fatigue (getting dressed and going to BR) and 8/10 knee pain with TE's.  Applied ICE and plan to return a little later.   Follow Up Recommendations  Home health PT     Equipment Recommendations  None recommended by PT    Recommendations for Other Services       Precautions / Restrictions Precautions Precautions: Knee;Fall Precaution Comments: instructed on KI use for amb and stairs  Required Braces or Orthoses: Knee Immobilizer - Right Knee Immobilizer - Right: Discontinue once straight leg raise with < 10 degree lag Restrictions Weight Bearing Restrictions: No Other Position/Activity Restrictions: WBAT    Mobility  Bed Mobility Overal bed mobility: Needs Assistance Bed Mobility: Sit to Supine     Supine to sit: Min guard Sit to supine: Min guard   General bed mobility comments: pt uses other LE to assist R LE on/off bed  Transfers Overall transfer level: Needs assistance Equipment used: Rolling walker (2 wheeled) Transfers: Sit to/from Stand Sit to Stand: Min guard         General transfer comment: out of bathroom to bed to perform TE's  Ambulation/Gait Ambulation/Gait assistance: Min guard;Supervision Ambulation Distance (Feet): 10 Feet Assistive device: Rolling walker (2 wheeled) Gait Pattern/deviations: Step-to pattern;Decreased stance time - right;Trunk flexed Gait velocity: decreased   General Gait Details: <25% VC's on proper walker to self distance and safety with turns   Stairs         General stair comments: peformed stairs yesterday and did not feel need to repeat again today.   Wheelchair Mobility    Modified Rankin (Stroke Patients Only)        Balance                                    Cognition                            Exercises   Total Knee Replacement TE's 10 reps B LE ankle pumps 10 reps towel squeezes 10 reps knee presses Followed by ICE     General Comments        Pertinent Vitals/Pain Pain Assessment: 0-10 Pain Score: 7  Pain Location: R knee Pain Descriptors / Indicators: Tightness;Sore Pain Intervention(s): Monitored during session;Premedicated before session;Repositioned;Ice applied    Home Living                      Prior Function            PT Goals (current goals can now be found in the care plan section)      Frequency  7X/week    PT Plan      Co-evaluation             End of Session Equipment Utilized During Treatment: Gait belt;Right knee immobilizer Activity Tolerance: Patient tolerated treatment well Patient left: in bed;with call bell/phone within reach;with family/visitor present     Time: 0916-0929 PT Time Calculation (min) (ACUTE ONLY): 13 min  Charges:  $Therapeutic Activity: 8-22 mins  G Codes:      Rica Koyanagi  PTA [facility]  Acute  Rehab Pager      [phone]

## 2014-10-09 NOTE — Discharge Summary (Signed)
Physician Discharge Summary   Patient ID: Philip Gates MRN: [id number] DOB/AGE: 58-06-1956 58 y.o.  Admit date: 10/06/2014 Discharge date: 10-09-2014  Primary Diagnosis:  Osteoarthritis Right knee(s) Admission Diagnoses:  Past Medical History  Diagnosis Date  . Asthma     ACTIVITY INDUCED  . Sinus drainage   . Arthritis   . GERD (gastroesophageal reflux disease)   . Bipolar 1 disorder   . Neck pain, chronic   . Cellulitis 05/2014    right side face  . 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 BPH (benign prostatic hyperplasia)    Discharge Diagnoses:   Principal Problem:   OA (osteoarthritis) of knee  Estimated body mass index is 28.33 kg/(m^2) as calculated from the following:   Height as of this encounter: _0  (1.803 m).   Weight as of this encounter: 92.08 kg (203 lb).  Procedure:  Procedure(s) (LRB): RIGHT TOTAL KNEE ARTHROPLASTY (Right)   Consults: None  HPI: Philip Gates is a 59 y.o. year old male with end stage OA of his right knee with progressively worsening pain and dysfunction. He has constant pain, with activity and at rest and significant functional deficits with difficulties even with ADLs. He has had extensive non-op management including analgesics, injections of cortisone and viscosupplements, and home exercise program, but remains in significant pain with significant dysfunction. Radiographs show bone on bone arthritis patellofemoral. He presents now for right Total Knee Arthroplasty.  Laboratory Data: Admission on 10/06/2014  Component Date Value Ref Range Status  . ABO/RH(D) 10/06/2014 AB POS   Final  . Antibody Screen 10/06/2014 NEG   Final  . Sample Expiration 10/06/2014 10/09/2014   Final  . WBC 10/07/2014 10.5  4.0 - 10.5 K/uL Final  . RBC 10/07/2014 4.08* 4.22 - 5.81 MIL/uL Final  . Hemoglobin 10/07/2014 11.9* 13.0 - 17.0 g/dL Final  . HCT 10/07/2014 36.0* 39.0 - 52.0 % Final  . MCV 10/07/2014 88.2  78.0 -  100.0 fL Final  . MCH 10/07/2014 29.2  26.0 - 34.0 pg Final  . MCHC 10/07/2014 33.1  30.0 - 36.0 g/dL Final  . RDW 10/07/2014 14.7  11.5 - 15.5 % Final  . Platelets 10/07/2014 206  150 - 400 K/uL Final  . Sodium 10/07/2014 139  135 - 145 mmol/L Final  . Potassium 10/07/2014 4.2  3.5 - 5.1 mmol/L Final  . Chloride 10/07/2014 103  96 - 112 mmol/L Final  . CO2 10/07/2014 28  19 - 32 mmol/L Final  . Glucose, Bld 10/07/2014 118* 70 - 99 mg/dL Final  . BUN 10/07/2014 6  6 - 23 mg/dL Final  . Creatinine, Ser 10/07/2014 0.79  0.50 - 1.35 mg/dL Final  . Calcium 10/07/2014 9.2  8.4 - 10.5 mg/dL Final  . GFR calc non Af Amer 10/07/2014 >90  >90 mL/min Final  . GFR calc Af Amer 10/07/2014 >90  >90 mL/min Final   Comment: (NOTE) The eGFR has been calculated using the CKD EPI equation. This calculation has not been validated in all clinical situations. eGFR's persistently <90 mL/min signify possible Chronic Kidney Disease.   . Anion gap 10/07/2014 8  5 - 15 Final  . WBC 10/08/2014 11.8* 4.0 - 10.5 K/uL Final  . RBC 10/08/2014 4.01* 4.22 - 5.81 MIL/uL Final  . Hemoglobin 10/08/2014 11.7* 13.0 - 17.0 g/dL Final  . HCT 10/08/2014 35.4* 39.0 - 52.0 % Final  . MCV 10/08/2014 88.3  78.0 - 100.0  fL Final  . MCH 10/08/2014 29.2  26.0 - 34.0 pg Final  . MCHC 10/08/2014 33.1  30.0 - 36.0 g/dL Final  . RDW 10/08/2014 14.9  11.5 - 15.5 % Final  . Platelets 10/08/2014 204  150 - 400 K/uL Final  . Sodium 10/08/2014 138  135 - 145 mmol/L Final  . Potassium 10/08/2014 3.9  3.5 - 5.1 mmol/L Final  . Chloride 10/08/2014 100  96 - 112 mmol/L Final  . CO2 10/08/2014 30  19 - 32 mmol/L Final  . Glucose, Bld 10/08/2014 105* 70 - 99 mg/dL Final  . BUN 10/08/2014 10  6 - 23 mg/dL Final  . Creatinine, Ser 10/08/2014 0.69  0.50 - 1.35 mg/dL Final  . Calcium 10/08/2014 10.0  8.4 - 10.5 mg/dL Final  . GFR calc non Af Amer 10/08/2014 >90  >90 mL/min Final  . GFR calc Af Amer 10/08/2014 >90  >90 mL/min Final   Comment:  (NOTE) The eGFR has been calculated using the CKD EPI equation. This calculation has not been validated in all clinical situations. eGFR's persistently <90 mL/min signify possible Chronic Kidney Disease.   . Anion gap 10/08/2014 8  5 - 15 Final  . WBC 10/09/2014 9.9  4.0 - 10.5 K/uL Final  . RBC 10/09/2014 3.92* 4.22 - 5.81 MIL/uL Final  . Hemoglobin 10/09/2014 11.4* 13.0 - 17.0 g/dL Final  . HCT 10/09/2014 35.0* 39.0 - 52.0 % Final  . MCV 10/09/2014 89.3  78.0 - 100.0 fL Final  . MCH 10/09/2014 29.1  26.0 - 34.0 pg Final  . MCHC 10/09/2014 32.6  30.0 - 36.0 g/dL Final  . RDW 10/09/2014 15.2  11.5 - 15.5 % Final  . Platelets 10/09/2014 225  150 - 400 K/uL Final  Hospital Outpatient Visit on 10/02/2014  Component Date Value Ref Range Status  . aPTT 10/02/2014 28  24 - 37 seconds Final  . WBC 10/02/2014 4.6  4.0 - 10.5 K/uL Final  . RBC 10/02/2014 4.95  4.22 - 5.81 MIL/uL Final  . Hemoglobin 10/02/2014 14.6  13.0 - 17.0 g/dL Final  . HCT 10/02/2014 44.0  39.0 - 52.0 % Final  . MCV 10/02/2014 88.9  78.0 - 100.0 fL Final  . MCH 10/02/2014 29.5  26.0 - 34.0 pg Final  . MCHC 10/02/2014 33.2  30.0 - 36.0 g/dL Final  . RDW 10/02/2014 14.8  11.5 - 15.5 % Final  . Platelets 10/02/2014 218  150 - 400 K/uL Final  . Sodium 10/02/2014 141  135 - 145 mmol/L Final  . Potassium 10/02/2014 4.5  3.5 - 5.1 mmol/L Final  . Chloride 10/02/2014 104  96 - 112 mmol/L Final  . CO2 10/02/2014 30  19 - 32 mmol/L Final  . Glucose, Bld 10/02/2014 95  70 - 99 mg/dL Final  . BUN 10/02/2014 <5* 6 - 23 mg/dL Final  . Creatinine, Ser 10/02/2014 0.91  0.50 - 1.35 mg/dL Final  . Calcium 10/02/2014 10.0  8.4 - 10.5 mg/dL Final  . Total Protein 10/02/2014 7.5  6.0 - 8.3 g/dL Final  . Albumin 10/02/2014 4.5  3.5 - 5.2 g/dL Final  . AST 10/02/2014 20  0 - 37 U/L Final  . ALT 10/02/2014 18  0 - 53 U/L Final  . Alkaline Phosphatase 10/02/2014 55  39 - 117 U/L Final  . Total Bilirubin 10/02/2014 0.2* 0.3 - 1.2 mg/dL  Final  . GFR calc non Af Amer 10/02/2014 >90  >90 mL/min Final  . GFR calc  Af Amer 10/02/2014 >90  >90 mL/min Final   Comment: (NOTE) The eGFR has been calculated using the CKD EPI equation. This calculation has not been validated in all clinical situations. eGFR's persistently <90 mL/min signify possible Chronic Kidney Disease.   . Anion gap 10/02/2014 7  5 - 15 Final  . Prothrombin Time 10/02/2014 12.6  11.6 - 15.2 seconds Final  . INR 10/02/2014 0.93  0.00 - 1.49 Final  . Color, Urine 10/02/2014 YELLOW  YELLOW Final  . APPearance 10/02/2014 CLEAR  CLEAR Final  . Specific Gravity, Urine 10/02/2014 1.006  1.005 - 1.030 Final  . pH 10/02/2014 7.5  5.0 - 8.0 Final  . Glucose, UA 10/02/2014 NEGATIVE  NEGATIVE mg/dL Final  . Hgb urine dipstick 10/02/2014 NEGATIVE  NEGATIVE Final  . Bilirubin Urine 10/02/2014 NEGATIVE  NEGATIVE Final  . Ketones, ur 10/02/2014 NEGATIVE  NEGATIVE mg/dL Final  . Protein, ur 10/02/2014 NEGATIVE  NEGATIVE mg/dL Final  . Urobilinogen, UA 10/02/2014 0.2  0.0 - 1.0 mg/dL Final  . Nitrite 10/02/2014 NEGATIVE  NEGATIVE Final  . Leukocytes, UA 10/02/2014 NEGATIVE  NEGATIVE Final   MICROSCOPIC NOT DONE ON URINES WITH NEGATIVE PROTEIN, BLOOD, LEUKOCYTES, NITRITE, OR GLUCOSE <1000 mg/dL.  Marland Kitchen MRSA, PCR 10/02/2014 NEGATIVE  NEGATIVE Final  . Staphylococcus aureus 10/02/2014 NEGATIVE  NEGATIVE Final   Comment:        The Xpert SA Assay (FDA approved for NASAL specimens in patients over 20 years of age), is one component of a comprehensive surveillance program.  Test performance has been validated by [facility] for patients greater than or equal to 38 year old. It is not intended to diagnose infection nor to guide or monitor treatment.      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 Initial encounter.  EXAM: CT ANGIOGRAPHY HEAD AND  NECK  TECHNIQUE: Multidetector CT imaging of the head and neck was performed using the standard protocol during bolus administration of intravenous contrast. Multiplanar CT image reconstructions and MIPs were obtained to evaluate the vascular anatomy. Carotid stenosis measurements (when applicable) are obtained utilizing NASCET criteria, using the distal internal carotid diameter as the denominator.  CONTRAST:  55m OMNIPAQUE IOHEXOL 350 MG/ML SOLN  COMPARISON:  Face CT with contrast 05/30/2014.  FINDINGS: CT HEAD  Brain: Cerebral volume is within normal limits for age. No midline shift or ventriculomegaly. No acute intracranial hemorrhage identified. No evidence of cortically based acute infarction identified. Gray-white matter differentiation is within normal limits throughout the brain. No abnormal parenchymal enhancement.  Calvarium and skull base:  No acute osseous abnormality identified.  Paranasal sinuses: Mild paranasal sinus mucosal thickening. Mastoids and tympanic cavities are clear.  Orbits: Visualized orbit soft tissues are within normal limits.  CTA NECK  Negative lung apices. No superior mediastinal lymphadenopathy. Thyroid, larynx, pharynx, parapharyngeal spaces, retropharyngeal space, sublingual space, submandibular glands, and parotid glands are within normal limits. Multilevel degenerative changes in the cervical spine with spondylolisthesis and bulky facet arthropathy at several levels. No acute osseous abnormality identified. No cervical lymphadenopathy.  Aortic arch: 3 vessel arch configuration. No great vessel origin stenosis.  Right carotid system: Soft and calcified plaque at the right carotid bifurcation, but no cervical carotid stenosis. Tortuous distal cervical right ICA just below the skullbase.  Left carotid system: Soft plaque at the left carotid bifurcation, no left cervical carotid stenosis.  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 Dominant right vertebral artery with a late entry into the cervical transverse foramen. No right vertebral artery stenosis in the neck.  No proximal left subclavian artery stenosis. Diminutive, non dominant left vertebral artery is patent to the skullbase.  CTA HEAD  Anterior circulation: Negative left ICA siphon. Left ophthalmic and posterior communicating artery origins are within normal limits. Normal left carotid terminus. Normal left MCA and ACA origin.  Patent right ICA terminus, without atherosclerosis. 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 The aneurysm is partially thrombosis (the inferior and lateral aspect). The rim calcification measures 1-2 mm, possibly up to 3 mm in some areas. This corresponds to the lesion described on knee 2015 face CT.  In addition, there is a small superiorly directed 2 mm right ICA para ophthalmic artery aneurysm best seen on series 606, image 75).  The more distal right posterior communicating artery origin is within normal limits. The right ICA terminus than is normal. The right MCA and ACA origins are within normal limits.  Anterior communicating artery and bilateral ACA branches are within normal limits. Left MCA branches are within normal limits. Right MCA branches are within normal limits.  Posterior circulation: Dominant distal right vertebral artery primarily supplies the basilar. Non dominant distal left vertebral artery functionally terminates in the left PICA. Dominant right AICA. No distal vertebral stenosis. Mild distal basilar artery irregularity, no basilar artery stenosis. SCA and left PCA origins are within normal limits. Fetal type right PCA origin. Bilateral PCA branches are within normal limits.  Diminutive left posterior communicating artery.  Venous sinuses: Within normal limits.  Anatomic variants: Fetal type right PCA origin.  IMPRESSION: 1. 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 It measures 18 x 15 x 15 mm, and appears stable corresponding to the lesion described on the September Face CT. 2. Very small 2-3 mm supraclinoid right ICA (paraophthalmic) intracranial aneurysm. 3. Recommend Neurosurgery consultation regarding #1 and #2. 4. Otherwise negative intracranial circulation. Atherosclerosis in the neck without hemodynamically significant stenosis. 5. 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 6.  No acute intracranial abnormality.   Electronically Signed   By: Lars Pinks M.D.   On: 09/18/2014 20:03   Dg Chest 2 View  10/02/2014   CLINICAL DATA:  Initial encounter for preoperative assessment for aneurysm surgery.  EXAM: CHEST  2 VIEW  COMPARISON:  12/27/2012.  FINDINGS: The lungs are clear without focal infiltrate, edema, pneumothorax or pleural effusion. The cardiopericardial silhouette is within normal limits for size. Imaged bony structures of the thorax are intact.  IMPRESSION: Stable.  No acute findings.   Electronically Signed   By: Misty Stanley M.D.   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 Initial encounter.  EXAM: CT ANGIOGRAPHY HEAD AND NECK  TECHNIQUE: Multidetector CT imaging of the head and neck was performed using the standard protocol during bolus administration of intravenous contrast. Multiplanar  CT image reconstructions and MIPs were obtained to evaluate the vascular anatomy. Carotid stenosis measurements (when applicable) are obtained utilizing NASCET criteria, using  the distal internal carotid diameter as the denominator.  CONTRAST:  6m OMNIPAQUE IOHEXOL 350 MG/ML SOLN  COMPARISON:  Face CT with contrast 05/30/2014.  FINDINGS: CT HEAD  Brain: Cerebral volume is within normal limits for age. No midline shift or ventriculomegaly. No acute intracranial hemorrhage identified. No evidence of cortically based acute infarction identified. Gray-white matter differentiation is within normal limits throughout the brain. No abnormal parenchymal enhancement.  Calvarium and skull base:  No acute osseous abnormality identified.  Paranasal sinuses: Mild paranasal sinus mucosal thickening. Mastoids and tympanic cavities are clear.  Orbits: Visualized orbit soft tissues are within normal limits.  CTA NECK  Negative lung apices. No superior mediastinal lymphadenopathy. Thyroid, larynx, pharynx, parapharyngeal spaces, retropharyngeal space, sublingual space, submandibular glands, and parotid glands are within normal limits. Multilevel degenerative changes in the cervical spine with spondylolisthesis and bulky facet arthropathy at several levels. No acute osseous abnormality identified. No cervical lymphadenopathy.  Aortic arch: 3 vessel arch configuration. No great vessel origin stenosis.  Right carotid system: Soft and calcified plaque at the right carotid bifurcation, but no cervical carotid stenosis. Tortuous distal cervical right ICA just below the skullbase.  Left carotid system: Soft plaque at the left carotid bifurcation, no left cervical carotid stenosis.  Vertebral arteries:  Unusual origin of the right vertebral artery occurring as a trifurcation at the branching of the a right brachiocephalic artery (into the right CCA and right subclavian artery). Dominant right vertebral artery with a late entry into the cervical transverse foramen. No right vertebral artery stenosis in the neck.  No proximal left subclavian artery stenosis. Diminutive, non dominant left vertebral artery is patent  to the skullbase.  CTA HEAD  Anterior circulation: Negative left ICA siphon. Left ophthalmic and posterior communicating artery origins are within normal limits. Normal left carotid terminus. Normal left MCA and ACA origin.  Patent right ICA terminus, without atherosclerosis. 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 The aneurysm is partially thrombosis (the inferior and lateral aspect). The rim calcification measures 1-2 mm, possibly up to 3 mm in some areas. This corresponds to the lesion described on knee 2015 face CT.  In addition, there is a small superiorly directed 2 mm right ICA para ophthalmic artery aneurysm best seen on series 606, image 75).  The more distal right posterior communicating artery origin is within normal limits. The right ICA terminus than is normal. The right MCA and ACA origins are within normal limits.  Anterior communicating artery and bilateral ACA branches are within normal limits. Left MCA branches are within normal limits. Right MCA branches are within normal limits.  Posterior circulation: Dominant distal right vertebral artery primarily supplies the basilar. Non dominant distal left vertebral artery functionally terminates in the left PICA. Dominant right AICA. No distal vertebral stenosis. Mild distal basilar artery irregularity, no basilar artery stenosis. SCA and left PCA origins are within normal limits. Fetal type right PCA origin. Bilateral PCA branches are within normal limits. Diminutive left posterior communicating artery.  Venous sinuses: Within normal limits.  Anatomic variants: Fetal type right PCA origin.  IMPRESSION: 1. 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 It measures 18 x 15 x 15 mm, and appears stable  corresponding to the lesion described on the September Face CT. 2. Very small 2-3 mm supraclinoid right ICA (paraophthalmic) intracranial aneurysm. 3. Recommend Neurosurgery consultation regarding #1 and #2. 4. Otherwise negative intracranial circulation. Atherosclerosis in the neck without hemodynamically significant stenosis. 5. 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 6.  No acute intracranial abnormality.   Electronically Signed   By: Lars Pinks M.D.   On: 09/18/2014 20:03    EKG: Orders placed or performed during the hospital encounter of 10/02/14  . EKG 12-Lead  . EKG 12-Lead     Hospital Course: Philip Gates is a 59 y.o. who was admitted to [facility]. They were brought to the operating room on 10/06/2014 and underwent Procedure(s): RIGHT TOTAL KNEE ARTHROPLASTY.  Patient tolerated the procedure well and was later transferred to the recovery room and then to the orthopaedic floor for postoperative care.  They were given PO and IV analgesics for pain control following their surgery.  They were given 24 hours of postoperative antibiotics of  Anti-infectives    Start     Dose/Rate Route Frequency Ordered Stop   10/06/14 1900  vancomycin (VANCOCIN) IVPB 1000 mg/200 mL premix     1,000 mg200 mL/hr over 60 Minutes Intravenous  Once 10/06/14 1052 10/06/14 1846   10/06/14 1300  ceFAZolin (ANCEF) IVPB 2 g/50 mL premix  Status:  Discontinued     2 g100 mL/hr over 30 Minutes Intravenous Every 6 hours 10/06/14 1025 10/06/14 1052   10/06/14 0537  vancomycin (VANCOCIN) IVPB 1000 mg/200 mL premix     1,000 mg200 mL/hr over 60 Minutes Intravenous On call to O.R. 10/06/14 0537 10/06/14 0700     and started on DVT prophylaxis in the form of Xarelto.   PT and OT were ordered for total joint protocol.  Discharge planning consulted to help with postop disposition and equipment needs.  Patient had a tough night on the evening of surgery due  to not much sleep.  They started to get up OOB with therapy on day one. Hemovac drain was pulled without difficulty.  Continued to work with therapy into day two.  Dressing was changed on day two and the incision was healing well.  By day three, the patient had progressed with therapy and meeting their goals.  Incision was healing well.  Patient was seen in rounds and was ready to go home.  Discharge home with home health Diet - Cardiac diet Follow up - in 2 weeks Activity - WBAT Disposition - Home Condition Upon Discharge - Good D/C Meds - See DC Summary DVT Prophylaxis - Xarelto      Discharge Instructions    Call MD / Call 911    Complete by:  As directed   If you experience chest pain or shortness of breath, CALL 911 and be transported to the hospital emergency room.  If you develope a fever above 101 F, pus (white drainage) or increased drainage or redness at the wound, or calf pain, call your surgeon's office.     Change dressing    Complete by:  As directed   Change dressing daily with sterile 4 x 4 inch gauze dressing and apply TED hose. Do not submerge the incision under water.     Constipation Prevention    Complete by:  As directed   Drink plenty of fluids.  Prune juice  may be helpful.  You may use a stool softener, such as Colace (over the counter) 100 mg twice a day.  Use MiraLax (over the counter) for constipation as needed.     Diet - low sodium heart healthy    Complete by:  As directed      Discharge instructions    Complete by:  As directed   Pick up stool softner and laxative for home use following surgery while on pain medications. Do not submerge incision under water. Please use good hand washing techniques while changing dressing each day. May shower starting three days after surgery. Please use a clean towel to pat the incision dry following showers. Continue to use ice for pain and swelling after surgery. Do not use any lotions or creams on the incision until  instructed by your surgeon.  Take Xarelto for two and a half more weeks, then discontinue Xarelto. Once the patient has completed the blood thinner regimen, then take a Baby 81 mg Aspirin daily for three more weeks.  Postoperative Constipation Protocol  Constipation - defined medically as fewer than three stools per week and severe constipation as less than one stool per week.  One of the most common issues patients have following surgery is constipation.  Even if you have a regular bowel pattern at home, your normal regimen is likely to be disrupted due to multiple reasons following surgery.  Combination of anesthesia, postoperative narcotics, change in appetite and fluid intake all can affect your bowels.  In order to avoid complications following surgery, here are some recommendations in order to help you during your recovery period.  Colace (docusate) - Pick up an over-the-counter form of Colace or another stool softener and take twice a day as long as you are requiring postoperative pain medications.  Take with a full glass of water daily.  If you experience loose stools or diarrhea, hold the colace until you stool forms back up.  If your symptoms do not get better within 1 week or if they get worse, check with your doctor.  Dulcolax (bisacodyl) - Pick up over-the-counter and take as directed by the product packaging as needed to assist with the movement of your bowels.  Take with a full glass of water.  Use this product as needed if not relieved by Colace only.   MiraLax (polyethylene glycol) - Pick up over-the-counter to have on hand.  MiraLax is a solution that will increase the amount of water in your bowels to assist with bowel movements.  Take as directed and can mix with a glass of water, juice, soda, coffee, or tea.  Take if you go more than two days without a movement. Do not use MiraLax more than once per day. Call your doctor if you are still constipated or irregular after using this  medication for 7 days in a row.  If you continue to have problems with postoperative constipation, please contact the office for further assistance and recommendations.  If you experience "the worst abdominal pain ever" or develop nausea or vomiting, please contact the office immediatly for further recommendations for treatment.     Do not put a pillow under the knee. Place it under the heel.    Complete by:  As directed      Do not sit on low chairs, stoools or toilet seats, as it may be difficult to get up from low surfaces    Complete by:  As directed      Driving restrictions  Complete by:  As directed   No driving until released by the physician.     Increase activity slowly as tolerated    Complete by:  As directed      Lifting restrictions    Complete by:  As directed   No lifting until released by the physician.     Patient may shower    Complete by:  As directed   You may shower without a dressing once there is no drainage.  Do not wash over the wound.  If drainage remains, do not shower until drainage stops.     TED hose    Complete by:  As directed   Use stockings (TED hose) for 3 weeks on both leg(s).  You may remove them at night for sleeping.     Weight bearing as tolerated    Complete by:  As directed   Laterality:  right  Extremity:  Lower            Medication List    STOP taking these medications        doxycycline 100 MG tablet  Commonly known as:  VIBRA-TABS     HYDROcodone-acetaminophen 7.5-325 MG per tablet  Commonly known as:  NORCO     ibuprofen 200 MG tablet  Commonly known as:  ADVIL,MOTRIN     levofloxacin 500 MG tablet  Commonly known as:  LEVAQUIN     magic mouthwash Soln     VITAMIN C PO      TAKE these medications        chlorhexidine 0.12 % solution  Commonly known as:  PERIDEX  Use as directed 15 mLs in the mouth or throat 2 (two) times daily.     clindamycin 300 MG capsule  Commonly known as:  CLEOCIN  Take 300 mg by mouth  3 (three) times daily.     DEXILANT 60 MG capsule  Generic drug:  dexlansoprazole  Take 60 mg by mouth every morning.     divalproex 500 MG 24 hr tablet  Commonly known as:  DEPAKOTE ER  Take 1,500 mg by mouth at bedtime.     ipratropium 0.03 % nasal spray  Commonly known as:  ATROVENT  Place 2 sprays into the nose every 8 (eight) hours as needed for rhinitis (allergies).     JALYN 0.5-0.4 MG Caps  Generic drug:  Dutasteride-Tamsulosin HCl  Take 1 tablet by mouth every 8 (eight) hours as needed.     lamoTRIgine 200 MG tablet  Commonly known as:  LAMICTAL  Take 200 mg by mouth at bedtime.     levalbuterol 45 MCG/ACT inhaler  Commonly known as:  XOPENEX HFA  Inhale 1-2 puffs into the lungs every 6 (six) hours as needed for wheezing.     lisdexamfetamine 70 MG capsule  Commonly known as:  VYVANSE  Take 70 mg by mouth every morning.     loperamide 2 MG capsule  Commonly known as:  IMODIUM  Take 1 capsule (2 mg total) by mouth 3 (three) times daily as needed for diarrhea or loose stools.     methocarbamol 500 MG tablet  Commonly known as:  ROBAXIN  Take 1 tablet (500 mg total) by mouth every 6 (six) hours as needed for muscle spasms.     montelukast 10 MG tablet  Commonly known as:  SINGULAIR  Take 10 mg by mouth every morning.     Oxycodone HCl 10 MG Tabs  Take 1-2 tablets (10-20 mg total)  by mouth every 3 (three) hours as needed.     promethazine 25 MG tablet  Commonly known as:  PHENERGAN  Take 12.5 mg by mouth every 6 (six) hours as needed for nausea or vomiting.     RABEprazole 20 MG tablet  Commonly known as:  ACIPHEX  Take 20 mg by mouth every evening.     rivaroxaban 10 MG Tabs tablet  Commonly known as:  XARELTO  Take 1 tablet (10 mg total) by mouth daily with breakfast.     tadalafil 20 MG tablet  Commonly known as:  CIALIS  Take 20 mg by mouth daily as needed for erectile dysfunction.     testosterone cypionate 100 MG/ML injection  Commonly known  as:  DEPOTESTOTERONE CYPIONATE  Inject 100 mg into the muscle every 14 (fourteen) days. For IM use only     traMADol 50 MG tablet  Commonly known as:  ULTRAM  Take 1-2 tablets (50-100 mg total) by mouth every 6 (six) hours as needed for moderate pain.       Follow-up Information    Follow up with Gearlean Alf, MD. Schedule an appointment as soon as possible for a visit on 10/19/2014.   Specialty:  Orthopedic Surgery   Why:  Call [phone] Monday to make the appointment   Contact information:   [address] [place] [address] [phone]       Follow up with [facility].   Specialty:  Home Health Services   Why:  HHPT   Contact information:   [address] [place] [address] [phone]       Follow up with Gearlean Alf, MD. Schedule an appointment as soon as possible for a visit in 2 weeks.   Specialty:  Orthopedic Surgery   Why:  Call office at [phone] to set up appointment with Dr. Wynelle Link.   Contact information:   [address] [place] [address] [phone]       Signed: [name], PA-C Orthopaedic Surgery 10/09/2014, 7:18 AM

## 2014-10-09 NOTE — Progress Notes (Signed)
Physical Therapy Treatment Patient Details Name: Philip Gates MRN: [id number] DOB: 12/27/1955 Today's Date: 10/09/2014    History of Present Illness R TKR    PT Comments    POD # 3 returned to complete TE's and amb pt in hallway with spouse.  Instructed on KI use for amb and stairs and instructed when to D/C.  Completed remaining TE's following HEP handout.    Follow Up Recommendations  Home health PT     Equipment Recommendations  None recommended by PT    Recommendations for Other Services       Precautions / Restrictions Precautions Precautions: Knee;Fall Precaution Comments: instructed on KI use for amb and stairs  Required Braces or Orthoses: Knee Immobilizer - Right Knee Immobilizer - Right: Discontinue once straight leg raise with < 10 degree lag Restrictions Weight Bearing Restrictions: No Other Position/Activity Restrictions: WBAT    Mobility  Bed Mobility Overal bed mobility: Needs Assistance Bed Mobility: Supine to Sit;Sit to Supine     Supine to sit: Min guard Sit to supine: Min guard   General bed mobility comments: pt uses other LE to assist R LE on/off bed  Transfers Overall transfer level: Needs assistance Equipment used: Rolling walker (2 wheeled) Transfers: Sit to/from Stand Sit to Stand: Min guard         General transfer comment: verbal cues for hand placement and increased time.  Family education on safe handling as well with caution for turns and backward gait.   Ambulation/Gait Ambulation/Gait assistance: Supervision;Min guard Ambulation Distance (Feet): 85 Feet Assistive device: Rolling walker (2 wheeled) Gait Pattern/deviations: Step-to pattern;Decreased stance time - right;Trunk flexed Gait velocity: decreased   General Gait Details: <25% VC's on proper walker to self distance and safety with turns   Stairs         General stair comments: peformed stairs yesterday and did not feel need to repeat again today.    Wheelchair Mobility    Modified Rankin (Stroke Patients Only)       Balance                                    Cognition                            Exercises   Total Knee Replacement TE's 10 reps B LE ankle pumps 10 reps towel squeezes 10 reps knee presses 10 reps heel slides  5  reps SAQ's 10 reps SLR's 10 reps ABD Followed by ICE     General Comments        Pertinent Vitals/Pain Pain Assessment: 0-10 Pain Score: 7  Pain Location: R knee Pain Descriptors / Indicators: Tightness;Sore Pain Intervention(s): Monitored during session;Premedicated before session;Repositioned;Ice applied    Home Living                      Prior Function            PT Goals (current goals can now be found in the care plan section)      Frequency  7X/week    PT Plan      Co-evaluation             End of Session Equipment Utilized During Treatment: Gait belt;Right knee immobilizer Activity Tolerance: Patient tolerated treatment well Patient left: in bed;with call bell/phone within reach;with family/visitor present  Time: 1657-9038 PT Time Calculation (min) (ACUTE ONLY): 31 min  Charges:  $Gait Training: 8-22 mins $Therapeutic Exercise: 8-22 mins                    G Codes:      Rica Koyanagi  PTA [facility]  Acute  Rehab Pager      [phone]

## 2014-10-09 NOTE — Progress Notes (Signed)
OT Cancellation Note  Patient Details Name: Philip Gates MRN: [id number] DOB: Jun 25, 1956   Cancelled Treatment:    Reason Eval/Treat Not Completed: Other (comment) Pt's wife states she feels comfortable with assisting with ADL and helped dress him. Pt in bathroom but reporting to wife he feels comfortable with toilet and shower transfer and doesn't feel he needs to practice further. Pt to d/c today.  [name], Garretson 10/09/2014, 9:08 AM

## 2014-10-27 ENCOUNTER — Ambulatory Visit (HOSPITAL_COMMUNITY): Admission: RE | Admit: 2014-10-27 | Payer: 59 | Source: Ambulatory Visit

## 2014-10-27 ENCOUNTER — Other Ambulatory Visit (HOSPITAL_COMMUNITY): Payer: Self-pay | Admitting: Neurosurgery

## 2014-11-07 ENCOUNTER — Ambulatory Visit (HOSPITAL_COMMUNITY): Admission: RE | Admit: 2014-11-07 | Payer: 59 | Source: Ambulatory Visit

## 2014-11-08 ENCOUNTER — Ambulatory Visit (HOSPITAL_COMMUNITY)
Admission: RE | Admit: 2014-11-08 | Discharge: 2014-11-08 | Disposition: A | Discharge status: Home / Self Care | Payer: 59 | Source: Ambulatory Visit | Attending: Neurosurgery | Admitting: Neurosurgery

## 2014-11-08 ENCOUNTER — Other Ambulatory Visit (HOSPITAL_COMMUNITY): Payer: Self-pay | Admitting: Neurosurgery

## 2014-11-08 DIAGNOSIS — Z87891 Personal history of nicotine dependence: Secondary | ICD-10-CM | POA: Insufficient documentation

## 2014-11-08 DIAGNOSIS — Z79899 Other long term (current) drug therapy: Secondary | ICD-10-CM | POA: Diagnosis not present

## 2014-11-08 DIAGNOSIS — J45909 Unspecified asthma, uncomplicated: Secondary | ICD-10-CM | POA: Insufficient documentation

## 2014-11-08 DIAGNOSIS — K219 Gastro-esophageal reflux disease without esophagitis: Secondary | ICD-10-CM | POA: Insufficient documentation

## 2014-11-08 DIAGNOSIS — N400 Enlarged prostate without lower urinary tract symptoms: Secondary | ICD-10-CM | POA: Diagnosis not present

## 2014-11-08 DIAGNOSIS — F319 Bipolar disorder, unspecified: Secondary | ICD-10-CM | POA: Insufficient documentation

## 2014-11-08 DIAGNOSIS — Z79891 Long term (current) use of opiate analgesic: Secondary | ICD-10-CM | POA: Insufficient documentation

## 2014-11-08 DIAGNOSIS — I671 Cerebral aneurysm, nonruptured: Secondary | ICD-10-CM | POA: Insufficient documentation

## 2014-11-08 DIAGNOSIS — Z96651 Presence of right artificial knee joint: Secondary | ICD-10-CM | POA: Diagnosis not present

## 2014-11-08 DIAGNOSIS — Z96642 Presence of left artificial hip joint: Secondary | ICD-10-CM | POA: Insufficient documentation

## 2014-11-08 LAB — BASIC METABOLIC PANEL
ANION GAP: 6 (ref 5–15)
BUN: 5 mg/dL — AB (ref 6–23)
CALCIUM: 9.7 mg/dL (ref 8.4–10.5)
CHLORIDE: 105 mmol/L (ref 96–112)
CO2: 28 mmol/L (ref 19–32)
CREATININE: 0.88 mg/dL (ref 0.50–1.35)
Glucose, Bld: 92 mg/dL (ref 70–99)
Potassium: 4.4 mmol/L (ref 3.5–5.1)
Sodium: 139 mmol/L (ref 135–145)

## 2014-11-08 LAB — CBC WITH DIFFERENTIAL/PLATELET
BASOS ABS: 0 10*3/uL (ref 0.0–0.1)
BASOS PCT: 1 % (ref 0–1)
Eosinophils Absolute: 0.1 10*3/uL (ref 0.0–0.7)
Eosinophils Relative: 1 % (ref 0–5)
HCT: 40.1 % (ref 39.0–52.0)
Hemoglobin: 13.2 g/dL (ref 13.0–17.0)
LYMPHS PCT: 28 % (ref 12–46)
Lymphs Abs: 1.9 10*3/uL (ref 0.7–4.0)
MCH: 28.7 pg (ref 26.0–34.0)
MCHC: 32.9 g/dL (ref 30.0–36.0)
MCV: 87.2 fL (ref 78.0–100.0)
MONO ABS: 0.6 10*3/uL (ref 0.1–1.0)
Monocytes Relative: 9 % (ref 3–12)
NEUTROS ABS: 4.1 10*3/uL (ref 1.7–7.7)
Neutrophils Relative %: 61 % (ref 43–77)
PLATELETS: 227 10*3/uL (ref 150–400)
RBC: 4.6 MIL/uL (ref 4.22–5.81)
RDW: 14.9 % (ref 11.5–15.5)
WBC: 6.6 10*3/uL (ref 4.0–10.5)

## 2014-11-08 LAB — PROTIME-INR
INR: 1 (ref 0.00–1.49)
Prothrombin Time: 13.4 seconds (ref 11.6–15.2)

## 2014-11-08 LAB — APTT: aPTT: 28 seconds (ref 24–37)

## 2014-11-08 MED ORDER — MIDAZOLAM HCL 2 MG/2ML IJ SOLN
INTRAMUSCULAR | Status: AC | PRN
  Administered 2014-11-08: 0.5 mg via INTRAVENOUS

## 2014-11-08 MED ORDER — SODIUM CHLORIDE 0.9 % IV SOLN: INTRAVENOUS | Status: DC

## 2014-11-08 MED ORDER — MIDAZOLAM HCL 2 MG/2ML IJ SOLN
INTRAMUSCULAR | Status: AC
  Filled 2014-11-08: qty 2

## 2014-11-08 MED ORDER — HEPARIN SODIUM (PORCINE) 1000 UNIT/ML IJ SOLN
INTRAMUSCULAR | Status: AC | PRN
  Administered 2014-11-08: 2000 [IU] via INTRAVENOUS

## 2014-11-08 MED ORDER — HEPARIN SODIUM (PORCINE) 1000 UNIT/ML IJ SOLN
INTRAMUSCULAR | Status: AC
  Filled 2014-11-08: qty 1

## 2014-11-08 MED ORDER — HYDROCODONE-ACETAMINOPHEN 5-325 MG PO TABS: 1.0000 | ORAL_TABLET | ORAL | Status: DC | PRN

## 2014-11-08 MED ORDER — IOHEXOL 300 MG/ML  SOLN
150.0000 mL | Freq: Once | INTRAMUSCULAR | Status: AC | PRN
  Administered 2014-11-08: 70 mL via INTRAVENOUS

## 2014-11-08 MED ORDER — OXYCODONE HCL 5 MG PO TABS
10.0000 mg | ORAL_TABLET | Freq: Once | ORAL | Status: AC
Start: 2014-11-08 — End: 2014-11-08
  Administered 2014-11-08: 10 mg via ORAL

## 2014-11-08 MED ORDER — OXYCODONE HCL 5 MG PO TABS
ORAL_TABLET | ORAL | Status: AC
  Filled 2014-11-08: qty 2

## 2014-11-08 MED ORDER — FENTANYL CITRATE 0.05 MG/ML IJ SOLN
INTRAMUSCULAR | Status: AC
  Filled 2014-11-08: qty 2

## 2014-11-08 MED ORDER — LIDOCAINE HCL 1 % IJ SOLN
INTRAMUSCULAR | Status: AC
  Filled 2014-11-08: qty 20

## 2014-11-08 MED ORDER — FENTANYL CITRATE 0.05 MG/ML IJ SOLN
INTRAMUSCULAR | Status: AC | PRN
  Administered 2014-11-08: 25 ug via INTRAVENOUS
  Administered 2014-11-08: 50 ug via INTRAVENOUS

## 2014-11-08 NOTE — Discharge Instructions (Signed)

## 2014-11-08 NOTE — Sedation Documentation (Signed)
Exoseal to R groin, intact; no bleeding noted

## 2014-11-08 NOTE — Progress Notes (Signed)
CC:  Aneurysm  HPI: Philip Gates is a 59 y.o. male seen initially in the office. He had a CT for facial cellulitis which incidentally detected an intracranial aneurysm.   PMH: Past Medical History  Diagnosis Date  . Asthma     ACTIVITY INDUCED  . Sinus drainage   . Arthritis   . GERD (gastroesophageal reflux disease)   . Bipolar 1 disorder   . Neck pain, chronic   . Cellulitis 05/2014    right side face  . 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 BPH (benign prostatic hyperplasia)     PSH: Past Surgical History  Procedure Laterality Date  . Nose surgery    . Total hip arthroplasty Left 01/03/2013    Procedure: TOTAL HIP ARTHROPLASTY ANTERIOR APPROACH;  Surgeon: Gearlean Alf, MD;  Location: [facility] ORS;  Service: Orthopedics;  Laterality: Left;  . Dental implant  09/26/2014    left lower dental implant  . Total knee arthroplasty Right 10/06/2014    Procedure: RIGHT TOTAL KNEE ARTHROPLASTY;  Surgeon: Gearlean Alf, MD;  Location: [facility]S;  Service: Orthopedics;  Laterality: Right;    SH: History  Substance Use Topics  . Smoking status: Former Smoker    Quit date: 05/29/2014  . Smokeless tobacco: Not on file  . Alcohol Use: No    MEDS: Prior to Admission medications   Medication Sig Start Date End Date Taking? Authorizing Provider  dexlansoprazole (DEXILANT) 60 MG capsule Take 60 mg by mouth every morning.    Yes Historical Provider, MD  divalproex (DEPAKOTE ER) 500 MG 24 hr tablet Take 1,500 mg by mouth at bedtime.    Yes Historical Provider, MD  Dutasteride-Tamsulosin HCl (JALYN) 0.5-0.4 MG CAPS Take 1 tablet by mouth every 8 (eight) hours as needed.    Yes Historical Provider, MD  HYDROcodone-acetaminophen (NORCO) 7.5-325 MG per tablet Take 1-2 tablets by mouth every 6 (six) hours as needed for moderate pain.   Yes Historical Provider, MD  lamoTRIgine (LAMICTAL) 200 MG tablet Take 200 mg by mouth at bedtime.   Yes Historical Provider, MD   levalbuterol Pipeline [facility] HFA) 45 MCG/ACT inhaler Inhale 1-2 puffs into the lungs every 6 (six) hours as needed for wheezing.    Yes Historical Provider, MD  methocarbamol (ROBAXIN) 500 MG tablet Take 1 tablet (500 mg total) by mouth every 6 (six) hours as needed for muscle spasms. 10/09/14  Yes [name], PA-C  montelukast (SINGULAIR) 10 MG tablet Take 10 mg by mouth every morning.    Yes Historical Provider, MD  promethazine (PHENERGAN) 25 MG tablet Take 12.5 mg by mouth every 6 (six) hours as needed for nausea or vomiting.   Yes Historical Provider, MD  RABEprazole (ACIPHEX) 20 MG tablet Take 20 mg by mouth every evening.    Yes Historical Provider, MD  traMADol (ULTRAM) 50 MG tablet Take 1-2 tablets (50-100 mg total) by mouth every 6 (six) hours as needed for moderate pain. 10/09/14  Yes [name], PA-C  chlorhexidine (PERIDEX) 0.12 % solution Use as directed 15 mLs in the mouth or throat 2 (two) times daily.    Historical Provider, MD  lisdexamfetamine (VYVANSE) 70 MG capsule Take 70 mg by mouth every morning.     Historical Provider, MD  loperamide (IMODIUM) 2 MG capsule Take 1 capsule (2 mg total) by mouth 3 (three) times daily as needed for diarrhea or loose stools. 06/01/14   Bonnielee Haff, MD  Oxycodone HCl 10 MG TABS Take 1-2 tablets (10-20 mg total) by mouth every 3 (three) hours as needed. Patient not taking: Reported on 10/25/2014 10/09/14   [name], PA-C  rivaroxaban (XARELTO) 10 MG TABS tablet Take 1 tablet (10 mg total) by mouth daily with breakfast. 10/09/14   [name], PA-C  tadalafil (CIALIS) 20 MG tablet Take 20 mg by mouth daily as needed for erectile dysfunction.    Historical Provider, MD  testosterone cypionate (DEPOTESTOTERONE CYPIONATE) 100 MG/ML injection Inject 100 mg into the muscle every 14 (fourteen) days. For IM use only    Historical Provider, MD    ALLERGY: Allergies  Allergen Reactions  . Aspirin Other (See Comments)    Do not take with depakote.   Marland Kitchen  Penicillins Rash    ROS: ROS  NEUROLOGIC EXAM: Awake, alert, oriented Memory and concentration grossly intact Speech fluent, appropriate CN grossly intact Motor exam: Upper Extremities Deltoid Bicep Tricep Grip  Right 5/5 5/5 5/5 5/5  Left 5/5 5/5 5/5 5/5   Lower Extremity IP Quad PF DF EHL  Right 5/5 5/5 5/5 5/5 5/5  Left 5/5 5/5 5/5 5/5 5/5   Sensation grossly intact to LT  IMGAING: CTA demonstrates ~26mm rim calcified RICA aneurysm. There is also a small possible supraclinoid RICA aneurysm as well.  IMPRESSION: - 59 y.o. 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

## 2014-11-08 NOTE — Sedation Documentation (Signed)
Pt screaming- upset about back pain, BP 140/99-  MD aware- orders to give more Fentanyl for back pain. Pt has had hip replacements/ knee replacements.

## 2014-11-08 NOTE — Progress Notes (Signed)
Per Dr Ralene Ok ok to elevate head of bed 30 degrees

## 2014-11-08 NOTE — Progress Notes (Signed)
Per Dr. Ralene Ok heart rate average of 105 ok and patient may leave at 8 pm if vital signs and groin check is good.

## 2014-11-23 ENCOUNTER — Other Ambulatory Visit (HOSPITAL_COMMUNITY): Payer: Self-pay | Admitting: Neurosurgery

## 2014-11-23 DIAGNOSIS — I729 Aneurysm of unspecified site: Secondary | ICD-10-CM

## 2014-11-29 ENCOUNTER — Other Ambulatory Visit (HOSPITAL_COMMUNITY): Payer: Self-pay | Admitting: Neurosurgery

## 2014-12-19 ENCOUNTER — Inpatient Hospital Stay (HOSPITAL_COMMUNITY)
Admission: RE | Admit: 2014-12-19 | Discharge: 2014-12-19 | Disposition: A | Discharge status: Home / Self Care | Payer: 59 | Source: Ambulatory Visit

## 2014-12-19 NOTE — Pre-Procedure Instructions (Signed)
Philip Gates  12/19/2014   Your procedure is scheduled on:  12/29/14  Report to [facility] Admitting at 10 AM.  Call this number if you have problems the morning of surgery: [phone]   Remember:   Do not eat food or drink liquids after midnight.   Take these medicines the morning of surgery with A SIP OF WATER: depakote,doxycyline,hydrocodone,all linhalers,singular   Do not wear jewelry, make-up or nail polish.  Do not wear lotions, powders, or perfumes. You may wear deodorant.  Do not shave 48 hours prior to surgery. Men may shave face and neck.  Do not bring valuables to the hospital.  [facility] is not responsible                  for any belongings or valuables.               Contacts, dentures or bridgework may not be worn into surgery.  Leave suitcase in the car. After surgery it may be brought to your room.  For patients admitted to the hospital, discharge time is determined by your                treatment team.               Patients discharged the day of surgery will not be allowed to drive  home.  Name and phone number of your driver:   Special Instructions: Shower using CHG 2 nights before surgery and the night before surgery.  If you shower the day of surgery use CHG.  Use special wash - you have one bottle of CHG for all showers.  You should use approximately 1/3 of the bottle for each shower.   Please read over the following fact sheets that you were given: Pain Booklet, Coughing and Deep Breathing and Surgical Site Infection Prevention

## 2014-12-28 MED ORDER — VANCOMYCIN HCL IN DEXTROSE 1-5 GM/200ML-% IV SOLN
1000.0000 mg | INTRAVENOUS | Status: AC
  Administered 2014-12-29: 1000 mg via INTRAVENOUS

## 2014-12-29 ENCOUNTER — Encounter (HOSPITAL_COMMUNITY)
Admission: RE | Disposition: A | Discharge status: Home / Self Care | Payer: Self-pay | Source: Ambulatory Visit | Attending: Neurosurgery

## 2014-12-29 ENCOUNTER — Inpatient Hospital Stay (HOSPITAL_COMMUNITY): Payer: 59 | Admitting: Anesthesiology

## 2014-12-29 ENCOUNTER — Inpatient Hospital Stay (HOSPITAL_COMMUNITY)
Admission: RE | Admit: 2014-12-29 | Discharge: 2014-12-30 | DRG: 272 | Disposition: A | Discharge status: Home / Self Care | Payer: 59 | Source: Ambulatory Visit | Attending: Neurosurgery | Admitting: Neurosurgery

## 2014-12-29 ENCOUNTER — Inpatient Hospital Stay (HOSPITAL_COMMUNITY): Payer: 59

## 2014-12-29 ENCOUNTER — Encounter (HOSPITAL_COMMUNITY): Payer: Self-pay | Admitting: *Deleted

## 2014-12-29 ENCOUNTER — Ambulatory Visit (HOSPITAL_COMMUNITY)
Admission: RE | Admit: 2014-12-29 | Discharge: 2014-12-29 | Disposition: A | Discharge status: Home / Self Care | Payer: 59 | Source: Ambulatory Visit | Attending: Neurosurgery | Admitting: Neurosurgery

## 2014-12-29 DIAGNOSIS — Z87891 Personal history of nicotine dependence: Secondary | ICD-10-CM

## 2014-12-29 DIAGNOSIS — Z7982 Long term (current) use of aspirin: Secondary | ICD-10-CM

## 2014-12-29 DIAGNOSIS — I720 Aneurysm of carotid artery: Principal | ICD-10-CM | POA: Diagnosis present

## 2014-12-29 DIAGNOSIS — K219 Gastro-esophageal reflux disease without esophagitis: Secondary | ICD-10-CM | POA: Diagnosis present

## 2014-12-29 DIAGNOSIS — M1990 Unspecified osteoarthritis, unspecified site: Secondary | ICD-10-CM | POA: Diagnosis present

## 2014-12-29 DIAGNOSIS — Z7902 Long term (current) use of antithrombotics/antiplatelets: Secondary | ICD-10-CM

## 2014-12-29 DIAGNOSIS — N400 Enlarged prostate without lower urinary tract symptoms: Secondary | ICD-10-CM | POA: Diagnosis present

## 2014-12-29 DIAGNOSIS — J45909 Unspecified asthma, uncomplicated: Secondary | ICD-10-CM | POA: Diagnosis present

## 2014-12-29 DIAGNOSIS — I671 Cerebral aneurysm, nonruptured: Secondary | ICD-10-CM | POA: Diagnosis present

## 2014-12-29 DIAGNOSIS — I729 Aneurysm of unspecified site: Secondary | ICD-10-CM

## 2014-12-29 DIAGNOSIS — Z880 Allergy status to penicillin: Secondary | ICD-10-CM | POA: Diagnosis not present

## 2014-12-29 DIAGNOSIS — Z96642 Presence of left artificial hip joint: Secondary | ICD-10-CM | POA: Diagnosis present

## 2014-12-29 DIAGNOSIS — F319 Bipolar disorder, unspecified: Secondary | ICD-10-CM | POA: Diagnosis present

## 2014-12-29 HISTORY — PX: RADIOLOGY WITH ANESTHESIA: SHX6223

## 2014-12-29 LAB — MRSA PCR SCREENING: MRSA BY PCR: NEGATIVE

## 2014-12-29 LAB — CBC
HCT: 41.9 % (ref 39.0–52.0)
Hemoglobin: 13.7 g/dL (ref 13.0–17.0)
MCH: 28 pg (ref 26.0–34.0)
MCHC: 32.7 g/dL (ref 30.0–36.0)
MCV: 85.5 fL (ref 78.0–100.0)
Platelets: 297 10*3/uL (ref 150–400)
RBC: 4.9 MIL/uL (ref 4.22–5.81)
RDW: 14 % (ref 11.5–15.5)
WBC: 5.2 10*3/uL (ref 4.0–10.5)

## 2014-12-29 LAB — BASIC METABOLIC PANEL
Anion gap: 11 (ref 5–15)
BUN: 7 mg/dL (ref 6–23)
CHLORIDE: 102 mmol/L (ref 96–112)
CO2: 27 mmol/L (ref 19–32)
CREATININE: 1.02 mg/dL (ref 0.50–1.35)
Calcium: 9.5 mg/dL (ref 8.4–10.5)
GFR calc Af Amer: >90 mL/min (ref >90)
GFR calc non Af Amer: 80 mL/min — ABNORMAL LOW (ref >90)
Glucose, Bld: 105 mg/dL — ABNORMAL HIGH (ref 70–99)
Potassium: 4.5 mmol/L (ref 3.5–5.1)
SODIUM: 140 mmol/L (ref 135–145)

## 2014-12-29 LAB — APTT: aPTT: 30 seconds (ref 24–37)

## 2014-12-29 LAB — PROTIME-INR
INR: 0.96 (ref 0.00–1.49)
Prothrombin Time: 12.9 seconds (ref 11.6–15.2)

## 2014-12-29 IMAGING — XA IR TRANSCATH EMBOLIZATION
8 of 11 series · 11 of 24 positions shown · IV contrast (IODINE)
Comparison: none

PROCEDURE:
PIPELINE EMBOLIZATION OF RIGHT INTERNAL CAROTID ARTERY ANEURYSM

OPERATOR:
Dr. ESPARZA GARCIA
HISTORY: The patient is a 58 y.o. yo male with a incidentally discovered 1 cm
right paraophthalmic internal carotid artery aneurysm, confirmed in
further delineated on diagnostic cerebral angiogram done proximally
6 weeks ago. He now presents for elective pipeline embolization.

[Series 1: carotid 1 · 2 acquisitions, 2 frames shown (1 of 7)]
[im 1/2]
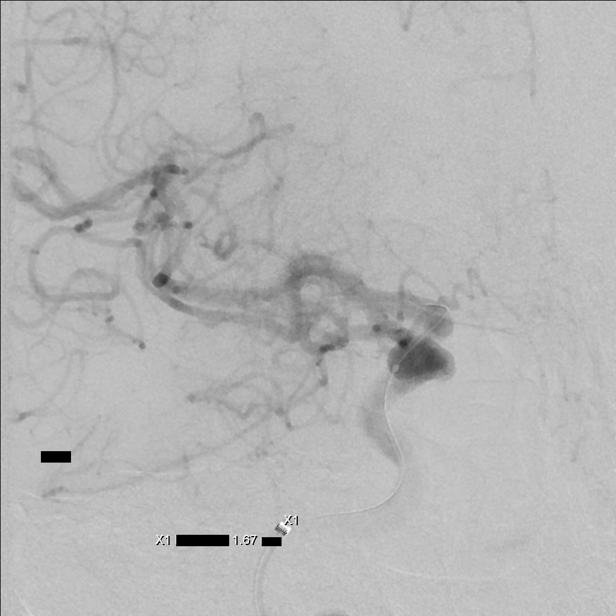
[im 2/2]
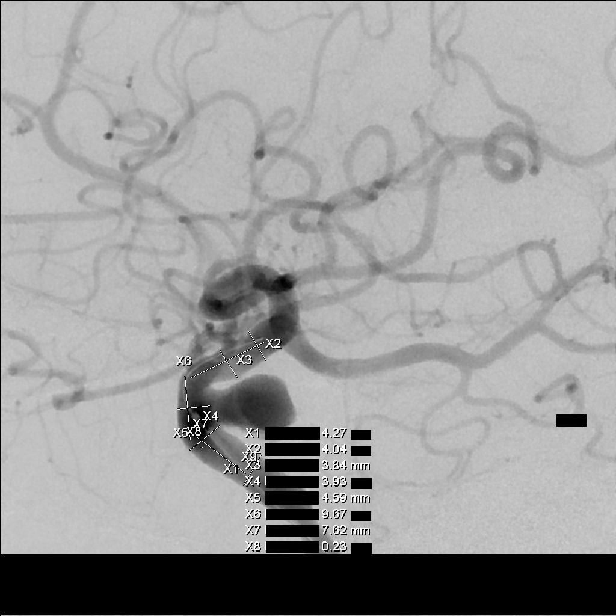

[Series 4: carotid 1 · 1 of 2 slices shown (2 of 7)]
[im 1/2]
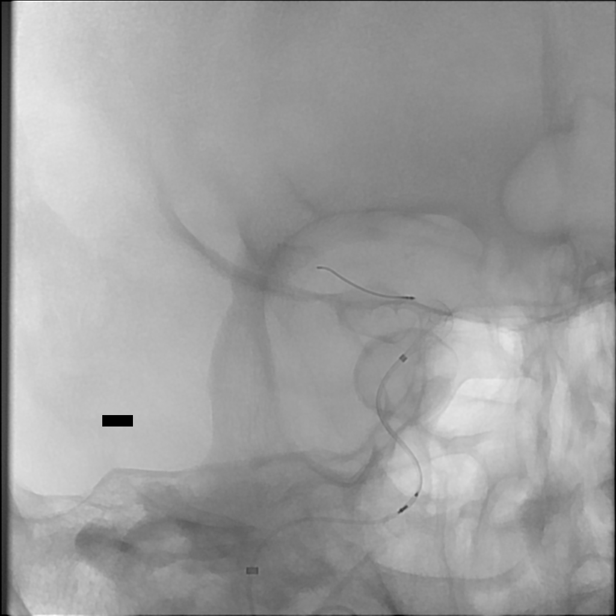

[Series 7: carotid 1 · 1 of 2 slices shown (3 of 7)]
[im 1/2]
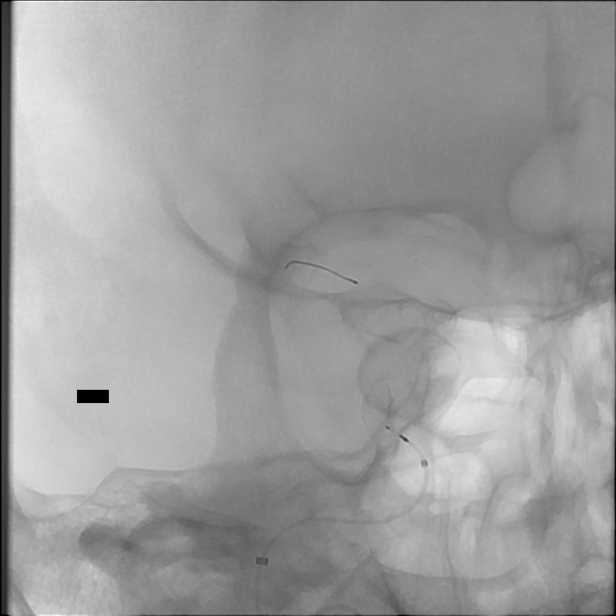

[Series 8: carotid 1 · 2 acquisitions, 1 frame shown (4 of 7)]
[im 1/2]
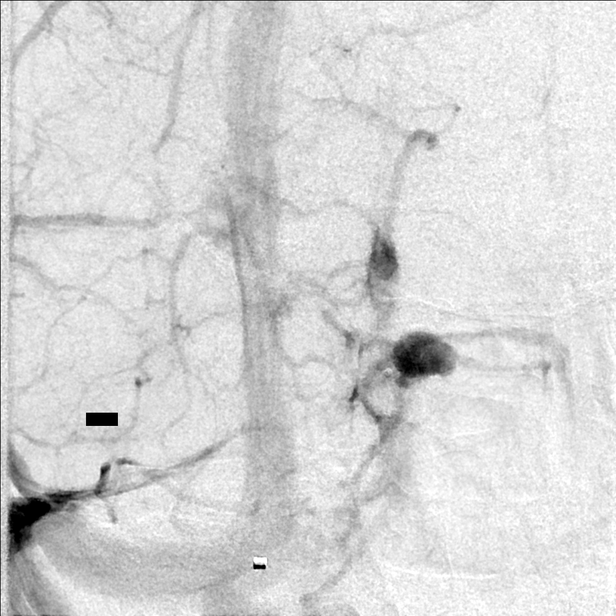

[Series 10: carotid 1 · 2 acquisitions, 1 frame shown (5 of 7)]
[im 1/2]
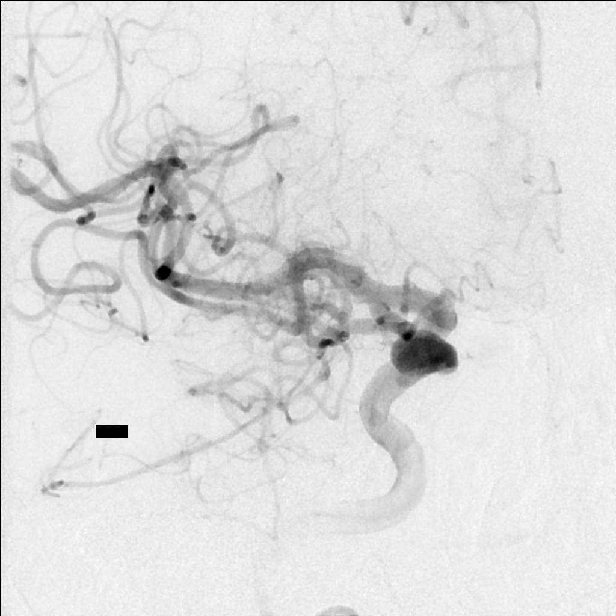

[Series 11: carotid 1 · 2 acquisitions, 1 frame shown (6 of 7)]
[im 1/2]
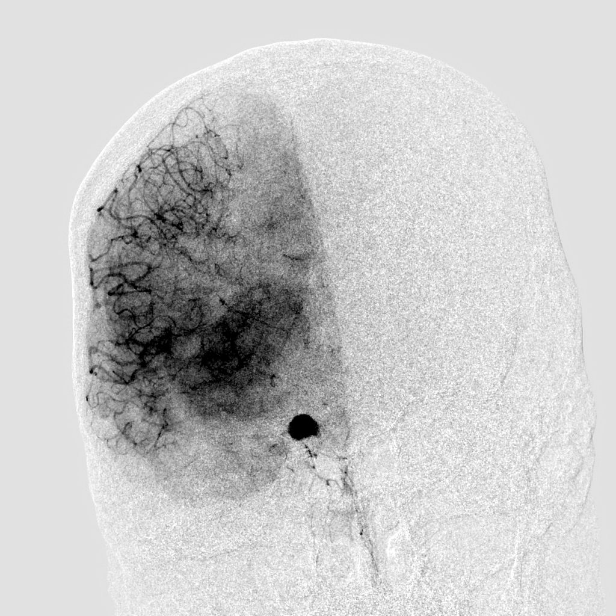

[Series 12: fl neuro · 1 of 19 frames shown]
[frame 10/19]
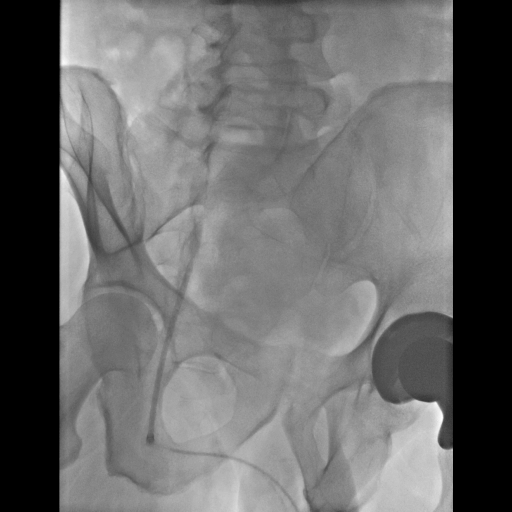

[Series 300: carotid 1 · 3 of 18 slices shown (7 of 7)]
[im 1/18]
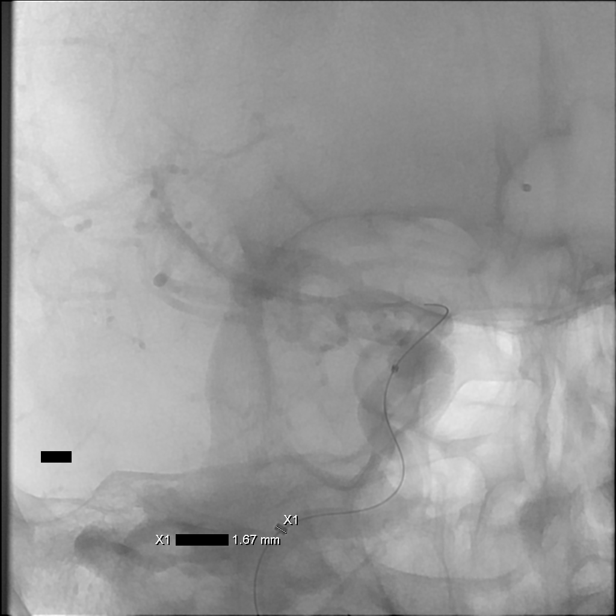
[im 7/18]
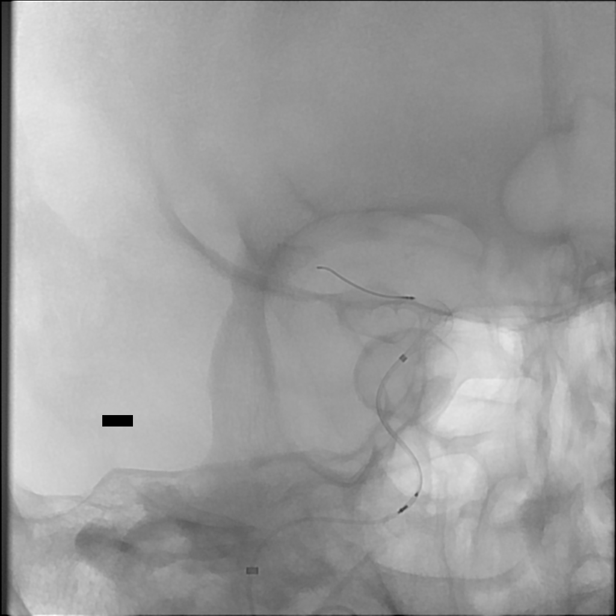
[im 14/18]
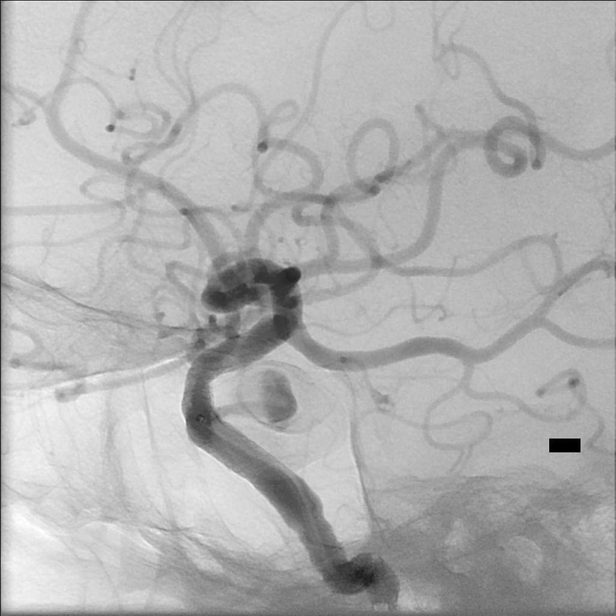

[11 of 24 positions shown; findings below may reference images not displayed]

APPROACH:

The technical aspects of the procedure as well as its potential
risks and benefits were reviewed with the patient and family . These
risks included but were not limited bleeding, infection, allergic
reaction, damage to organs/vital structures, stroke, non-diagnostic
procedure, and the catastrophic outcomes of heart attack, coma, and
death. With an understanding of these risks, informed consent was
obtained and witnessed.

The patient was placed in the supine position on the angiography
table and the skin of right groin prepped in the usual sterile
fashion. The procedure was performed under general anesthesia.

An 8- French sheath was introduced in the right common femoral
artery using Seldinger technique. A fluorophase sequence was used to
document the sheath position.

HEPARIN: [GR] Units total.

CONTRAST AGENT: 60cc, Omnipaque 300

FLUOROSCOPY TIME: 28.6 combined AP and lateral minutes

CATHETER(S) AND WIRE(S):

6-French Penumbra Select vert catheter

088 NeuronMax guide sheath

115cm Catalyst 5 catheter

150cm Via 27 microcatheter

[GR] STD microwire

VESSELS CATHETERIZED:

Right internal carotid artery

Right middle cerebral artery

VESSELS STUDIED:

Right internal carotid artery, head: AP, lateral (pre-embolization)

Right middle cerebral artery, head: AP, lateral

Right internal carotid artery, head: AP, lateral (immediate post
embolization)

Right internal carotid artery, head: AP, lateral (5 minutes
postembolization)

Right internal carotid artery, head: AP, lateral (10 minutes
postembolization)

Right internal carotid artery, head: AP, lateral (final control)

PROCEDURAL NARRATIVE:

Under real-time fluoroscopy, the select catheter was advanced into
the right common carotid artery over an 0.035" guidewire. The guide
sheath was then advanced into the common carotid artery. After
roadmap was taken, the guide sheath was advanced into the proximal
internal carotid artery over the support of the select catheter and
guidewire. The Shuttle Select catheter was removed and a 058
catalyst guidecatheter was coaxially introduced and advanced over
the 027 microcatheter and microwire.

Under roadmap guidance, the microcatheter was advanced over a
Synchro 2 standard microwire into the internal carotid artery and
then into the MCA. The guide catheter was then tracked over the

Microcatheter to its final position in the distal petrous/proximal
cavernous ICA. The Synchro 2 standard microwire was removed and
Pipeline stent system was introduced after microcatheter run was
taken.

Under real time fluoroscopy, the distal Pipeline device was opened
in the MCA and withdrawn back into the supraclinoid ICA. Once
satisfactory position of the Pipeline device was obtained,
deployment was performed across the aneurysm neck under real-time
fluoroscopy. Several single shots were performed during stent
deployment to document its stability and appropriate deployment.
Once deployment was complete, the microcatheter was then advanced
through the Pipeline device and the pusher wire was recaptured and
removed without incident. Cerebral angiography was performed
immediately post deployment and at 5- and 10- minutes post
deployment to document patency of the parent vessel and adequate
positioning of the Pipeline. The microcatheter was subsequently
removed without incident. Final control angiography was then
performed from the guide catheter.

The guide catheter and shuttle sheath were then removed
synchronously.

INTERPRETATION:

Right femoral artery:

The right femoral artery is unremarkable, arterial sheath in
adequate position.

Right internal carotid artery, head (pre-embolization):

Injection reveals the presence of a widely patent ICA that leads to
a patent ACA and MCA. The previously described paraophthalmic ICA
aneurysm is again visualized. The visualized portions of capillary
and venous phases are unremarkable.

Right internal carotid artery, head (immediate post-PED deployment):

Injection reveals the presence of a widely patent ICA that leads to
a patent ACA and MCA. The pipeline device is positioned across the
neck of the aneurysm, and extends from the cavernous to supraclinoid
ICA. The pipeline device is widely open with good vessel wall
apposition in its proximal and distal ends. There is no thrombus
visualized within the pipeline device.

Right internal carotid artery, head (5-min post-deployment):

The ICA, ACA, and MCA are widely patent. The device position is
stable, with good aneurysm coverage. The device is widely open with
good vessel wall apposition in its proximal and distal ends.
Significant contrast stasis is still seen. There is no thrombus
visualized within the device.

Right internal carotid artery, head (10-min post-deployment):

The ICA, ACA, and MCA are widely patent. The device position is
stable, with good aneurysm coverage. The device is widely open with
good vessel wall apposition in its proximal and distal ends.
Significant contrast stasis is still seen. There is no thrombus
visualized within the device.

Right internal carotid artery, head (final control):

Injection reveals the presence of a widely patent ICA that leads to
a patent ACA and MCA. The pipeline device is widely patent with good
vessel wall apposition. No thrombus visualized with the pipeline
device. Significant contrast stasis is seen within the aneurysm. The
parenchymal and venous phases are unremarkable.

DISPOSITION:

After completion of the procedure, the femoral sheath was removed
and hemostasis secured by application of a closure device. The
procedure was well tolerated and no early complications were
observed.

The patient was extubated in the angiography suite. Upon extubation,
patient was following commands and moving all extremities well. The
patient was transferred to the PACU for further recovery.
IMPRESSION: 1. Successful Pipeline embolization of a paraophthalmic right
internal carotid artery aneurysm without local thrombotic or distal
embolic complication.

The patient's family was informed of the preliminary results of the
procedure.

## 2014-12-29 SURGERY — RADIOLOGY WITH ANESTHESIA
Anesthesia: General

## 2014-12-29 MED ORDER — PANTOPRAZOLE SODIUM 40 MG PO TBEC
40.0000 mg | DELAYED_RELEASE_TABLET | Freq: Every day | ORAL | Status: DC
  Administered 2014-12-30: 40 mg via ORAL
  Filled 2014-12-29: qty 1

## 2014-12-29 MED ORDER — PANTOPRAZOLE SODIUM 40 MG PO TBEC: 40.0000 mg | DELAYED_RELEASE_TABLET | Freq: Every day | ORAL | Status: DC

## 2014-12-29 MED ORDER — LOPERAMIDE HCL 2 MG PO CAPS
2.0000 mg | ORAL_CAPSULE | Freq: Three times a day (TID) | ORAL | Status: DC | PRN
  Filled 2014-12-29: qty 1

## 2014-12-29 MED ORDER — SODIUM CHLORIDE 0.9 % IV SOLN
10.0000 mg | INTRAVENOUS | Status: DC | PRN
  Administered 2014-12-29: 50 ug/min via INTRAVENOUS

## 2014-12-29 MED ORDER — HEPARIN SODIUM (PORCINE) 1000 UNIT/ML IJ SOLN
INTRAMUSCULAR | Status: AC
  Administered 2014-12-29: 5000 [IU] via INTRAVENOUS
  Filled 2014-12-29: qty 1

## 2014-12-29 MED ORDER — ONDANSETRON HCL 4 MG/2ML IJ SOLN: 4.0000 mg | Freq: Once | INTRAMUSCULAR | Status: DC | PRN

## 2014-12-29 MED ORDER — DUTASTERIDE-TAMSULOSIN HCL 0.5-0.4 MG PO CAPS: 1.0000 | ORAL_CAPSULE | Freq: Every day | ORAL | Status: DC

## 2014-12-29 MED ORDER — CHLORHEXIDINE GLUCONATE 0.12 % MT SOLN
15.0000 mL | Freq: Two times a day (BID) | OROMUCOSAL | Status: DC
  Administered 2014-12-29 – 2014-12-30 (×2): 15 mL via OROMUCOSAL
  Filled 2014-12-29 (×2): qty 15

## 2014-12-29 MED ORDER — FENTANYL CITRATE (PF) 100 MCG/2ML IJ SOLN
INTRAMUSCULAR | Status: DC | PRN
  Administered 2014-12-29: 250 ug via INTRAVENOUS

## 2014-12-29 MED ORDER — IOHEXOL 300 MG/ML  SOLN
150.0000 mL | Freq: Once | INTRAMUSCULAR | Status: AC | PRN
  Administered 2014-12-29: 60 mL via INTRA_ARTERIAL

## 2014-12-29 MED ORDER — PROPOFOL 10 MG/ML IV BOLUS
INTRAVENOUS | Status: AC
  Filled 2014-12-29: qty 20

## 2014-12-29 MED ORDER — LEVALBUTEROL HCL 0.63 MG/3ML IN NEBU
0.6300 mg | INHALATION_SOLUTION | Freq: Four times a day (QID) | RESPIRATORY_TRACT | Status: DC | PRN
Start: 2014-12-29 — End: 2014-12-30

## 2014-12-29 MED ORDER — LEVALBUTEROL TARTRATE 45 MCG/ACT IN AERO: 1.0000 | INHALATION_SPRAY | Freq: Four times a day (QID) | RESPIRATORY_TRACT | Status: DC | PRN

## 2014-12-29 MED ORDER — ONDANSETRON HCL 4 MG/2ML IJ SOLN
INTRAMUSCULAR | Status: DC | PRN
  Administered 2014-12-29: 4 mg via INTRAVENOUS

## 2014-12-29 MED ORDER — MEPERIDINE HCL 25 MG/ML IJ SOLN
6.2500 mg | INTRAMUSCULAR | Status: DC | PRN
Start: 2014-12-29 — End: 2014-12-29

## 2014-12-29 MED ORDER — IPRATROPIUM BROMIDE 0.02 % IN SOLN: 0.5000 mg | Freq: Two times a day (BID) | RESPIRATORY_TRACT | Status: DC | PRN

## 2014-12-29 MED ORDER — HYDROCODONE-ACETAMINOPHEN 5-325 MG PO TABS
ORAL_TABLET | ORAL | Status: AC
  Filled 2014-12-29: qty 2

## 2014-12-29 MED ORDER — FENTANYL CITRATE (PF) 250 MCG/5ML IJ SOLN
INTRAMUSCULAR | Status: AC
  Filled 2014-12-29: qty 5

## 2014-12-29 MED ORDER — DIVALPROEX SODIUM ER 500 MG PO TB24
1000.0000 mg | ORAL_TABLET | Freq: Every day | ORAL | Status: DC
  Administered 2014-12-29: 1000 mg via ORAL
  Filled 2014-12-29 (×2): qty 2

## 2014-12-29 MED ORDER — GLYCOPYRROLATE 0.2 MG/ML IJ SOLN
INTRAMUSCULAR | Status: DC | PRN
  Administered 2014-12-29: 0.4 mg via INTRAVENOUS

## 2014-12-29 MED ORDER — ASPIRIN 325 MG PO TABS
325.0000 mg | ORAL_TABLET | Freq: Every day | ORAL | Status: DC
Start: 2014-12-30 — End: 2014-12-30
  Administered 2014-12-30: 325 mg via ORAL
  Filled 2014-12-29: qty 1

## 2014-12-29 MED ORDER — HYDROCODONE-ACETAMINOPHEN 7.5-325 MG PO TABS
1.0000 | ORAL_TABLET | Freq: Four times a day (QID) | ORAL | Status: DC | PRN
  Administered 2014-12-29: 2 via ORAL
  Administered 2014-12-30: 1 via ORAL
  Filled 2014-12-29: qty 2
  Filled 2014-12-29: qty 1

## 2014-12-29 MED ORDER — MORPHINE SULFATE 2 MG/ML IJ SOLN
1.0000 mg | INTRAMUSCULAR | Status: DC | PRN
  Administered 2014-12-29 – 2014-12-30 (×4): 2 mg via INTRAVENOUS
  Filled 2014-12-29 (×4): qty 1

## 2014-12-29 MED ORDER — ONDANSETRON HCL 4 MG/2ML IJ SOLN
INTRAMUSCULAR | Status: AC
  Filled 2014-12-29: qty 2

## 2014-12-29 MED ORDER — CLOPIDOGREL BISULFATE 75 MG PO TABS
75.0000 mg | ORAL_TABLET | Freq: Every day | ORAL | Status: DC
  Administered 2014-12-30: 75 mg via ORAL
  Filled 2014-12-29 (×2): qty 1

## 2014-12-29 MED ORDER — HYDROMORPHONE HCL 1 MG/ML IJ SOLN
0.2500 mg | INTRAMUSCULAR | Status: DC | PRN
  Administered 2014-12-29 (×3): 0.5 mg via INTRAVENOUS

## 2014-12-29 MED ORDER — LISDEXAMFETAMINE DIMESYLATE 70 MG PO CAPS
70.0000 mg | ORAL_CAPSULE | Freq: Every morning | ORAL | Status: DC
  Administered 2014-12-30: 70 mg via ORAL
  Filled 2014-12-29: qty 1

## 2014-12-29 MED ORDER — HYDROMORPHONE HCL 1 MG/ML IJ SOLN
INTRAMUSCULAR | Status: AC
  Filled 2014-12-29: qty 1

## 2014-12-29 MED ORDER — EPHEDRINE SULFATE 50 MG/ML IJ SOLN
INTRAMUSCULAR | Status: DC | PRN
  Administered 2014-12-29 (×2): 5 mg via INTRAVENOUS

## 2014-12-29 MED ORDER — HYDROCODONE-ACETAMINOPHEN 7.5-325 MG PO TABS
1.0000 | ORAL_TABLET | Freq: Four times a day (QID) | ORAL | Status: DC | PRN
  Administered 2014-12-29: 2 via ORAL

## 2014-12-29 MED ORDER — ROCURONIUM BROMIDE 100 MG/10ML IV SOLN
INTRAVENOUS | Status: DC | PRN
  Administered 2014-12-29: 50 mg via INTRAVENOUS

## 2014-12-29 MED ORDER — TAMSULOSIN HCL 0.4 MG PO CAPS
0.4000 mg | ORAL_CAPSULE | Freq: Every day | ORAL | Status: DC
  Administered 2014-12-30: 0.4 mg via ORAL
  Filled 2014-12-29: qty 1

## 2014-12-29 MED ORDER — HEPARIN SODIUM (PORCINE) 5000 UNIT/ML IJ SOLN
5000.0000 [IU] | Freq: Three times a day (TID) | INTRAMUSCULAR | Status: DC
  Administered 2014-12-30: 5000 [IU] via SUBCUTANEOUS
  Filled 2014-12-29 (×4): qty 1

## 2014-12-29 MED ORDER — LABETALOL HCL 5 MG/ML IV SOLN: 10.0000 mg | INTRAVENOUS | Status: DC | PRN

## 2014-12-29 MED ORDER — MONTELUKAST SODIUM 10 MG PO TABS
10.0000 mg | ORAL_TABLET | Freq: Every morning | ORAL | Status: DC
  Administered 2014-12-30: 10 mg via ORAL
  Filled 2014-12-29: qty 1

## 2014-12-29 MED ORDER — MIDAZOLAM HCL 2 MG/2ML IJ SOLN
INTRAMUSCULAR | Status: AC
  Filled 2014-12-29: qty 2

## 2014-12-29 MED ORDER — DOXYCYCLINE HYCLATE 100 MG PO CAPS: 100.0000 mg | ORAL_CAPSULE | Freq: Two times a day (BID) | ORAL | Status: DC

## 2014-12-29 MED ORDER — ONDANSETRON HCL 4 MG PO TABS: 4.0000 mg | ORAL_TABLET | ORAL | Status: DC | PRN

## 2014-12-29 MED ORDER — LIDOCAINE HCL (CARDIAC) 20 MG/ML IV SOLN
INTRAVENOUS | Status: AC
  Filled 2014-12-29: qty 5

## 2014-12-29 MED ORDER — SODIUM CHLORIDE 0.9 % IV SOLN
INTRAVENOUS | Status: DC
  Administered 2014-12-29 (×2): via INTRAVENOUS

## 2014-12-29 MED ORDER — PROPOFOL 10 MG/ML IV BOLUS
INTRAVENOUS | Status: DC | PRN
  Administered 2014-12-29: 200 mg via INTRAVENOUS

## 2014-12-29 MED ORDER — NEOSTIGMINE METHYLSULFATE 10 MG/10ML IV SOLN
INTRAVENOUS | Status: DC | PRN
  Administered 2014-12-29: 3 mg via INTRAVENOUS

## 2014-12-29 MED ORDER — LAMOTRIGINE 100 MG PO TABS
200.0000 mg | ORAL_TABLET | Freq: Every day | ORAL | Status: DC
  Administered 2014-12-29: 200 mg via ORAL
  Filled 2014-12-29: qty 2
  Filled 2014-12-29: qty 1

## 2014-12-29 MED ORDER — IPRATROPIUM BROMIDE 0.03 % NA SOLN: 2.0000 | Freq: Two times a day (BID) | NASAL | Status: DC | PRN

## 2014-12-29 MED ORDER — ONDANSETRON HCL 4 MG/2ML IJ SOLN: 4.0000 mg | INTRAMUSCULAR | Status: DC | PRN

## 2014-12-29 MED ORDER — LACTATED RINGERS IV SOLN
INTRAVENOUS | Status: DC
  Administered 2014-12-29 (×2): via INTRAVENOUS

## 2014-12-29 MED ORDER — LIDOCAINE HCL (CARDIAC) 20 MG/ML IV SOLN
INTRAVENOUS | Status: DC | PRN
  Administered 2014-12-29: 100 mg via INTRAVENOUS

## 2014-12-29 MED ORDER — DUTASTERIDE 0.5 MG PO CAPS
0.5000 mg | ORAL_CAPSULE | Freq: Every day | ORAL | Status: DC
  Administered 2014-12-30: 0.5 mg via ORAL
  Filled 2014-12-29: qty 1

## 2014-12-29 MED ORDER — HEPARIN SODIUM (PORCINE) 1000 UNIT/ML IJ SOLN
INTRAMUSCULAR | Status: DC
Start: 2014-12-29 — End: 2014-12-30
  Filled 2014-12-29: qty 1

## 2014-12-29 MED ORDER — PROMETHAZINE HCL 25 MG PO TABS: 12.5000 mg | ORAL_TABLET | Freq: Four times a day (QID) | ORAL | Status: DC | PRN

## 2014-12-29 MED ORDER — METHOCARBAMOL 500 MG PO TABS
500.0000 mg | ORAL_TABLET | Freq: Four times a day (QID) | ORAL | Status: DC | PRN
  Administered 2014-12-29: 500 mg via ORAL
  Filled 2014-12-29 (×2): qty 1

## 2014-12-29 MED ORDER — DOXYCYCLINE HYCLATE 100 MG PO TABS
100.0000 mg | ORAL_TABLET | Freq: Two times a day (BID) | ORAL | Status: DC
  Administered 2014-12-29 – 2014-12-30 (×2): 100 mg via ORAL
  Filled 2014-12-29 (×3): qty 1

## 2014-12-29 NOTE — H&P (Signed)
CC:  Aneurysm  HPI: Philip Gates is a 59 y.o. male initially seen in the outpatient clinic, after a CT scan of the face done for cellulitis incidentally discovered a partially calcified aneurysm. The patient has previously undergone diagnostic cerebral angiogram, and now presents for elective pipeline embolization. He has been taking aspirin 325 mg, and Plavix 75 mg every day for the last 10 days including this morning. He does not report any spontaneous nosebleeds, or gingival bleeding. He may be bruising slightly easier.  PMH: Past Medical History  Diagnosis Date  . Asthma     ACTIVITY INDUCED  . Sinus drainage   . Arthritis   . GERD (gastroesophageal reflux disease)   . Bipolar 1 disorder   . Neck pain, chronic   . Cellulitis 05/2014    right side face  . 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 BPH (benign prostatic hyperplasia)     PSH: Past Surgical History  Procedure Laterality Date  . Nose surgery    . Total hip arthroplasty Left 01/03/2013    Procedure: TOTAL HIP ARTHROPLASTY ANTERIOR APPROACH;  Surgeon: Gearlean Alf, MD;  Location: [facility];  Service: Orthopedics;  Laterality: Left;  . Dental implant  09/26/2014    left lower dental implant  . Total knee arthroplasty Right 10/06/2014    Procedure: RIGHT TOTAL KNEE ARTHROPLASTY;  Surgeon: Gearlean Alf, MD;  Location: [facility];  Service: Orthopedics;  Laterality: Right;    SH: History  Substance Use Topics  . Smoking status: Former Smoker    Quit date: 05/29/2014  . Smokeless tobacco: Not on file  . Alcohol Use: No    MEDS: Prior to Admission medications   Medication Sig Start Date End Date Taking? Authorizing Provider  aspirin 325 MG tablet Take 325 mg by mouth once.   Yes Historical Provider, MD  chlorhexidine (PERIDEX) 0.12 % solution Use as directed 15 mLs in the mouth or throat 2 (two) times daily.   Yes Historical Provider, MD  clindamycin (CLEOCIN) 300 MG capsule Take 600  mg by mouth daily as needed. 11/14/14  Yes Historical Provider, MD  clopidogrel (PLAVIX) 75 MG tablet Take 75 mg by mouth daily. 12/19/14  Yes Historical Provider, MD  dexlansoprazole (DEXILANT) 60 MG capsule Take 60 mg by mouth every morning.    Yes Historical Provider, MD  divalproex (DEPAKOTE ER) 500 MG 24 hr tablet Take 1,000 mg by mouth at bedtime.    Yes Historical Provider, MD  doxycycline (VIBRAMYCIN) 100 MG capsule Take 100 mg by mouth 2 (two) times daily.   Yes Historical Provider, MD  Dutasteride-Tamsulosin HCl (JALYN) 0.5-0.4 MG CAPS Take 1 capsule by mouth daily at 12 noon.    Yes Historical Provider, MD  HYDROcodone-acetaminophen (NORCO) 7.5-325 MG per tablet Take 1-2 tablets by mouth every 6 (six) hours as needed for moderate pain.   Yes Historical Provider, MD  ipratropium (ATROVENT) 0.03 % nasal spray Place 2 sprays into both nostrils every 12 (twelve) hours as needed for rhinitis.   Yes Historical Provider, MD  lamoTRIgine (LAMICTAL) 200 MG tablet Take 200 mg by mouth at bedtime.   Yes Historical Provider, MD  levalbuterol [facility] HFA) 45 MCG/ACT inhaler Inhale 1-2 puffs into the lungs every 6 (six) hours as needed for wheezing.    Yes Historical Provider, MD  lisdexamfetamine (VYVANSE) 70 MG capsule Take 70 mg by mouth every morning.    Yes Historical Provider, MD  methocarbamol (ROBAXIN) 500 MG tablet Take 1 tablet (500 mg total) by mouth every 6 (six) hours as needed for muscle spasms. 10/09/14  Yes [name], PA-C  montelukast (SINGULAIR) 10 MG tablet Take 10 mg by mouth every morning.    Yes Historical Provider, MD  promethazine (PHENERGAN) 25 MG tablet Take 12.5 mg by mouth every 6 (six) hours as needed for nausea or vomiting.   Yes Historical Provider, MD  RABEprazole (ACIPHEX) 20 MG tablet Take 20 mg by mouth every evening.    Yes Historical Provider, MD  tadalafil (CIALIS) 20 MG tablet Take 20 mg by mouth daily as needed for erectile dysfunction.   Yes Historical Provider, MD   testosterone cypionate (DEPOTESTOTERONE CYPIONATE) 100 MG/ML injection Inject 100 mg into the muscle every 14 (fourteen) days. For IM use only   Yes Historical Provider, MD  loperamide (IMODIUM) 2 MG capsule Take 1 capsule (2 mg total) by mouth 3 (three) times daily as needed for diarrhea or loose stools. 06/01/14   Bonnielee Haff, MD  Oxycodone HCl 10 MG TABS Take 1-2 tablets (10-20 mg total) by mouth every 3 (three) hours as needed. Patient not taking: Reported on 10/25/2014 10/09/14   [name], PA-C  rivaroxaban (XARELTO) 10 MG TABS tablet Take 1 tablet (10 mg total) by mouth daily with breakfast. Patient not taking: Reported on 12/15/2014 10/09/14   [name], PA-C  traMADol (ULTRAM) 50 MG tablet Take 1-2 tablets (50-100 mg total) by mouth every 6 (six) hours as needed for moderate pain. Patient not taking: Reported on 12/15/2014 10/09/14   [name], PA-C    ALLERGY: Allergies  Allergen Reactions  . Aspirin Other (See Comments)    Do not take with depakote.   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  IMPRESSION: - 59 y.o. 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  PLAN: - Proceed with endovascular pipeline embolization of the right ICA aneurysm - Neuro ICU postprocedure  The risks and benefits of the procedure, including treatment alternatives were all discussed with the patient and his family in the office. After all questions  were answered, informed consent was obtained.

## 2014-12-29 NOTE — Op Note (Signed)
PIPELINE EMBOLIZATION OF RIGHT INTERNAL CAROTID ARTERY ANEURYSM    OPERATOR:   Dr. Consuella Lose, MD  HISTORY:   The patient is a 59 y.o. 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 He now presents for elective pipeline embolization.  APPROACH:   The technical aspects of the procedure as well as its potential risks and benefits were reviewed with the patient and family . These risks included but were not limited bleeding, infection, allergic reaction, damage to organs/vital structures, stroke, non-diagnostic procedure, and the catastrophic outcomes of heart attack, coma, and death. With an understanding of these risks, informed consent was obtained and witnessed.    The patient was placed in the supine position on the angiography table and the skin of right groin prepped in the usual sterile fashion. The procedure was performed under general anesthesia.  An 8- [place] sheath was introduced in the right common femoral artery using Seldinger technique.  A fluorophase sequence was used to document the sheath position.    HEPARIN: 5000 Units total.   CONTRAST AGENT: 60cc, Omnipaque 300   FLUOROSCOPY TIME: 28.6 combined AP and lateral minutes    CATHETER(S) AND WIRE(S):    6-French [place]umb[place] Select vert catheter 088 NeuronMax guide sheath 115cm Catalyst 5 catheter 150cm Via 27 microcatheter Synchro2 STD microwire    VESSELS CATHETERIZED:   Right internal carotid artery Right middle cerebral artery  VESSELS STUDIED:   Right internal carotid artery, head: AP, lateral (pre-embolization) Right middle cerebral artery, head: AP, lateral Right internal carotid artery, head: AP, lateral (immediate post embolization) Right internal carotid artery, head: AP, lateral (5 minutes postembolization) Right internal carotid artery, head: AP, lateral (10  minutes postembolization) Right internal carotid artery, head: AP, lateral (final control)  PROCEDURAL NARRATIVE:   Under real-time fluoroscopy, the select catheter was advanced into the right common carotid artery over an 0.035" guidewire. The guide sheath was then advanced into the common carotid artery. After roadmap was taken, the guide sheath was advanced into the proximal internal carotid artery over the support of the select catheter and guidewire. The Shuttle Select catheter was removed and a 058 catalyst guidecatheter was coaxially introduced and advanced over the 027 microcatheter and microwire.  Under roadmap guidance, the microcatheter was advanced over a Synchro 2 standard microwire into the internal carotid artery and then into the MCA. The guide catheter was then tracked over the Microcatheter to its final position in the distal petrous/proximal cavernous ICA. The Synchro 2 standard microwire was removed and Pipeline stent system was introduced after microcatheter run was taken.  Under real time fluoroscopy, the distal Pipeline device was opened in the MCA and withdrawn back into the supraclinoid ICA. Once satisfactory position of the Pipeline device was obtained, deployment was performed across the aneurysm neck under real-time fluoroscopy. Several single shots were performed during stent deployment to document its stability and appropriate deployment. Once deployment was complete, the microcatheter was then advanced through the Pipeline device and the pusher wire was recaptured and removed without incident. Cerebral angiography was performed immediately post deployment and at 5- and 10- minutes post deployment to document patency of the parent vessel and adequate positioning of the Pipeline. The microcatheter was subsequently removed without incident. Final control angiography was then performed from the guide catheter.   The guide catheter and shuttle sheath were then removed  synchronously.  INTERPRETATION:   Right femoral  artery: The right femoral artery is unremarkable, arterial sheath in adequate position.  Right internal carotid artery, head (pre-embolization): Injection reveals the presence of a widely patent ICA that leads to a patent ACA and MCA. The previously described paraophthalmic ICA aneurysm is again visualized. The visualized portions of capillary and venous phases are unremarkable.  Right internal carotid artery, head (immediate post-PED deployment): Injection reveals the presence of a widely patent ICA that leads to a patent ACA and MCA. The pipeline device is positioned across the neck of the aneurysm, and extends from the cavernous to supraclinoid ICA. The pipeline device is widely open with good vessel wall apposition in its proximal and distal ends. There is no thrombus visualized within the pipeline device.  Right internal carotid artery, head (5-min post-deployment): The ICA, ACA, and MCA are widely patent. The device position is stable, with good aneurysm coverage. The device is widely open with good vessel wall apposition in its proximal and distal ends. Significant contrast stasis is still seen. There is no thrombus visualized within the device.   Right internal carotid artery, head (10-min post-deployment): The ICA, ACA, and MCA are widely patent. The device position is stable, with good aneurysm coverage. The device is widely open with good vessel wall apposition in its proximal and distal ends. Significant contrast stasis is still seen. There is no thrombus visualized within the device.   Right internal carotid artery, head (final control): Injection reveals the presence of a widely patent ICA that leads to a patent ACA and MCA. The pipeline device is widely patent with good vessel wall apposition. No thrombus visualized with the pipeline device. Significant contrast stasis is seen within the aneurysm. The parenchymal and venous  phases are unremarkable.  DISPOSITION: After completion of the procedure, the femoral sheath was removed and hemostasis secured by application of a closure device. The procedure was well tolerated and no early complications were observed.   The patient was extubated in the angiography suite. Upon extubation, patient was following commands and moving all extremities well. The patient was transferred to the PACU for further recovery.  IMPRESSION:  1. Successful Pipeline embolization of a paraophthalmic right internal carotid artery aneurysm without local thrombotic or distal embolic complication.  The patient's family was informed of the preliminary results of the procedure.

## 2014-12-29 NOTE — Transfer of Care (Signed)
Immediate Anesthesia Transfer of Care Note  Patient: Philip Gates  Procedure(s) Performed: Procedure(s): Embolization (N/A)  Patient Location: PACU  Anesthesia Type:General  Level of Consciousness: awake, alert  and oriented  Airway & Oxygen Therapy: Patient Spontanous Breathing and Patient connected to nasal cannula oxygen  Post-op Assessment: Report given to RN, Post -op Vital signs reviewed and stable and Patient moving all extremities X 4  Post vital signs: Reviewed and stable  Last Vitals:  Filed Vitals:   12/29/14 0957  BP: 152/102  Pulse: 87  Temp: 87.5 C    Complications: No apparent anesthesia complications

## 2014-12-29 NOTE — Anesthesia Procedure Notes (Signed)
Procedure Name: Intubation Date/Time: 12/29/2014 1:03 PM Performed by: Rush Farmer E Pre-anesthesia Checklist: Patient identified, Emergency Drugs available, Suction available, Patient being monitored and Timeout performed Patient Re-evaluated:Patient Re-evaluated prior to inductionOxygen Delivery Method: Circle system utilized Preoxygenation: Pre-oxygenation with 100% oxygen Intubation Type: IV induction Ventilation: Mask ventilation without difficulty Laryngoscope Size: Mac and 4 Grade View: Grade I Tube type: Oral Tube size: 8.0 mm Number of attempts: 1 Airway Equipment and Method: Stylet Placement Confirmation: ETT inserted through vocal cords under direct vision,  positive ETCO2 and breath sounds checked- equal and bilateral Secured at: 22 cm Tube secured with: Tape Dental Injury: Teeth and Oropharynx as per pre-operative assessment

## 2014-12-29 NOTE — Anesthesia Preprocedure Evaluation (Addendum)
Anesthesia Evaluation  Patient identified by MRN, date of birth, ID band Patient awake    Reviewed: Allergy & Precautions, NPO status , Patient's Chart, lab work & pertinent test results  Airway Mallampati: II  TM Distance: >3 FB Neck ROM: Full    Dental  (+) Poor Dentition   Pulmonary asthma , former smoker,    Pulmonary exam normal       Cardiovascular     Neuro/Psych    GI/Hepatic GERD-  Controlled and Medicated,  Endo/Other    Renal/GU      Musculoskeletal  (+) Arthritis -, Osteoarthritis,    Abdominal   Peds  Hematology   Anesthesia Other Findings   Reproductive/Obstetrics                            Anesthesia Physical Anesthesia Plan  ASA: III  Anesthesia Plan: General   Post-op Pain Management:    Induction: Intravenous  Airway Management Planned: Oral ETT  Additional Equipment: Arterial line  Intra-op Plan:   Post-operative Plan: Extubation in OR  Informed Consent: I have reviewed the patients History and Physical, chart, labs and discussed the procedure including the risks, benefits and alternatives for the proposed anesthesia with the patient or authorized representative who has indicated his/her understanding and acceptance.   Dental advisory given  Plan Discussed with: CRNA and Surgeon  Anesthesia Plan Comments:        Anesthesia Quick Evaluation

## 2014-12-30 NOTE — Progress Notes (Signed)
Subjective: Patient reports doing well.  Objective: Vital signs in last 24 hours: Temp:  [97.5 F (36.4 C)-98.7 F (37.1 C)] 97.9 F (36.6 C) (04/23 0740) Pulse Rate:  [30-104] 81 (04/23 0800) Resp:  [12-24] 19 (04/23 0800) BP: (99-152)/(63-102) 122/83 mmHg (04/23 0800) SpO2:  [93 %-99 %] 96 % (04/23 0800) Arterial Line BP: (92-165)/(58-93) 128/75 mmHg (04/23 0800) Weight:  [90.719 kg (200 lb)] 90.719 kg (200 lb) (04/22 0957)  Intake/Output from previous day: 04/22 0701 - 04/23 0700 In: 2873.8 [P.O.:240; I.V.:2633.8] Out: 4920 [Urine:4500; Blood:150] Intake/Output this shift: Total I/O In: 75 [I.V.:75] Out: 450 [Urine:450]  Physical Exam: Awake, alert, conversant.  Neurologic exam normal.  No headache.  Groin puncture site intact.  Lab Results:  Recent Labs  12/29/14 1012  WBC 5.2  HGB 13.7  HCT 41.9  PLT 297   BMET  Recent Labs  12/29/14 1012  NA 140  K 4.5  CL 102  CO2 27  GLUCOSE 105*  BUN 7  CREATININE 1.02  CALCIUM 9.5    Studies/Results: No results found.  Assessment/Plan: [place] home.  Plavix, ASA.  F/U 3 weeks.     LOS: 1 day    Peggyann Shoals, MD 12/30/2014, 8:58 AM

## 2014-12-30 NOTE — Discharge Summary (Signed)
Physician Discharge Summary  Patient ID: Philip Gates MRN: [id number] DOB/AGE: 1956/06/26 59 y.o.  Admit date: 12/29/2014 Discharge date: 12/30/2014  Admission Diagnoses:1 cm Right paraophthalmic internal carotid aneurysm  Discharge Diagnoses: Same Active Problems:   Cerebral aneurysm   Discharged Condition: good  Hospital Course: Patient underwent pipeline stent placement of aneurysm with postoperative ICU observation  Consults: None  Significant Diagnostic Studies: angiography: with pipeline stent  Treatments: surgery: Pipeline stent endovascular embolization of aneurysm  Discharge Exam: Blood pressure 129/65, pulse 87, temperature 97.9 F (36.6 C), temperature source Axillary, resp. rate 23, height 5\' 11"  (1.803 m), weight 90.719 kg (200 lb), SpO2 94 %. Neurologic: Alert and oriented X 3, normal strength and tone. Normal symmetric reflexes. Normal coordination and gait Wound:CDI  Disposition: Home     Medication List    STOP taking these medications        rivaroxaban 10 MG Tabs tablet  Commonly known as:  XARELTO      TAKE these medications        aspirin 325 MG tablet  Take 325 mg by mouth once.     chlorhexidine 0.12 % solution  Commonly known as:  PERIDEX  Use as directed 15 mLs in the mouth or throat 2 (two) times daily.     clindamycin 300 MG capsule  Commonly known as:  CLEOCIN  Take 600 mg by mouth daily as needed.     clopidogrel 75 MG tablet  Commonly known as:  PLAVIX  Take 75 mg by mouth daily.     DEXILANT 60 MG capsule  Generic drug:  dexlansoprazole  Take 60 mg by mouth every morning.     divalproex 500 MG 24 hr tablet  Commonly known as:  DEPAKOTE ER  Take 1,000 mg by mouth at bedtime.     doxycycline 100 MG capsule  Commonly known as:  VIBRAMYCIN  Take 100 mg by mouth 2 (two) times daily.     HYDROcodone-acetaminophen 7.5-325 MG per tablet  Commonly known as:  NORCO  Take 1-2 tablets by mouth every 6 (six) hours as needed  for moderate pain.     ipratropium 0.03 % nasal spray  Commonly known as:  ATROVENT  Place 2 sprays into both nostrils every 12 (twelve) hours as needed for rhinitis.     JALYN 0.5-0.4 MG Caps  Generic drug:  Dutasteride-Tamsulosin HCl  Take 1 capsule by mouth daily at 12 noon.     lamoTRIgine 200 MG tablet  Commonly known as:  LAMICTAL  Take 200 mg by mouth at bedtime.     levalbuterol 45 MCG/ACT inhaler  Commonly known as:  XOPENEX HFA  Inhale 1-2 puffs into the lungs every 6 (six) hours as needed for wheezing.     lisdexamfetamine 70 MG capsule  Commonly known as:  VYVANSE  Take 70 mg by mouth every morning.     loperamide 2 MG capsule  Commonly known as:  IMODIUM  Take 1 capsule (2 mg total) by mouth 3 (three) times daily as needed for diarrhea or loose stools.     methocarbamol 500 MG tablet  Commonly known as:  ROBAXIN  Take 1 tablet (500 mg total) by mouth every 6 (six) hours as needed for muscle spasms.     montelukast 10 MG tablet  Commonly known as:  SINGULAIR  Take 10 mg by mouth every morning.     Oxycodone HCl 10 MG Tabs  Take 1-2 tablets (10-20 mg total) by mouth every 3 (  three) hours as needed.     promethazine 25 MG tablet  Commonly known as:  PHENERGAN  Take 12.5 mg by mouth every 6 (six) hours as needed for nausea or vomiting.     RABEprazole 20 MG tablet  Commonly known as:  ACIPHEX  Take 20 mg by mouth every evening.     tadalafil 20 MG tablet  Commonly known as:  CIALIS  Take 20 mg by mouth daily as needed for erectile dysfunction.     testosterone cypionate 100 MG/ML injection  Commonly known as:  DEPOTESTOTERONE CYPIONATE  Inject 100 mg into the muscle every 14 (fourteen) days. For IM use only     traMADol 50 MG tablet  Commonly known as:  ULTRAM  Take 1-2 tablets (50-100 mg total) by mouth every 6 (six) hours as needed for moderate pain.         Signed: Peggyann Shoals, MD 12/30/2014, 9:28 AM

## 2015-01-01 ENCOUNTER — Encounter (HOSPITAL_COMMUNITY): Payer: Self-pay | Admitting: Neurosurgery

## 2015-01-01 NOTE — Anesthesia Postprocedure Evaluation (Signed)
  Anesthesia Post-op Note  Patient: Philip Gates  Procedure(s) Performed: Procedure(s): Embolization (N/A)  Patient Location: PACU  Anesthesia Type:General  Level of Consciousness: awake and alert   Airway and Oxygen Therapy: Patient Spontanous Breathing  Post-op Pain: none  Post-op Assessment: Post-op Vital signs reviewed  Post-op Vital Signs: Reviewed  Last Vitals:  Filed Vitals:   12/30/14 1000  BP: 124/75  Pulse: 92  Temp:   Resp: 17    Complications: No apparent anesthesia complications

## 2015-01-16 ENCOUNTER — Emergency Department (HOSPITAL_COMMUNITY): Payer: 59

## 2015-01-16 ENCOUNTER — Inpatient Hospital Stay (HOSPITAL_COMMUNITY)
Admission: EM | Admit: 2015-01-16 | Discharge: 2015-01-18 | DRG: 390 | Disposition: A | Discharge status: Home / Self Care | Payer: 59 | Attending: Internal Medicine | Admitting: Internal Medicine

## 2015-01-16 ENCOUNTER — Encounter (HOSPITAL_COMMUNITY): Payer: Self-pay | Admitting: *Deleted

## 2015-01-16 DIAGNOSIS — K5660 Unspecified intestinal obstruction: Principal | ICD-10-CM | POA: Diagnosis present

## 2015-01-16 DIAGNOSIS — M1990 Unspecified osteoarthritis, unspecified site: Secondary | ICD-10-CM | POA: Diagnosis present

## 2015-01-16 DIAGNOSIS — K219 Gastro-esophageal reflux disease without esophagitis: Secondary | ICD-10-CM | POA: Diagnosis present

## 2015-01-16 DIAGNOSIS — Z7982 Long term (current) use of aspirin: Secondary | ICD-10-CM | POA: Diagnosis not present

## 2015-01-16 DIAGNOSIS — K56609 Unspecified intestinal obstruction, unspecified as to partial versus complete obstruction: Secondary | ICD-10-CM | POA: Diagnosis present

## 2015-01-16 DIAGNOSIS — J45909 Unspecified asthma, uncomplicated: Secondary | ICD-10-CM | POA: Diagnosis present

## 2015-01-16 DIAGNOSIS — F3170 Bipolar disorder, currently in remission, most recent episode unspecified: Secondary | ICD-10-CM | POA: Diagnosis present

## 2015-01-16 DIAGNOSIS — D72829 Elevated white blood cell count, unspecified: Secondary | ICD-10-CM | POA: Diagnosis present

## 2015-01-16 DIAGNOSIS — Z87891 Personal history of nicotine dependence: Secondary | ICD-10-CM | POA: Diagnosis not present

## 2015-01-16 DIAGNOSIS — I671 Cerebral aneurysm, nonruptured: Secondary | ICD-10-CM | POA: Diagnosis present

## 2015-01-16 DIAGNOSIS — Z79899 Other long term (current) drug therapy: Secondary | ICD-10-CM | POA: Diagnosis not present

## 2015-01-16 DIAGNOSIS — K5669 Other intestinal obstruction: Secondary | ICD-10-CM | POA: Diagnosis present

## 2015-01-16 DIAGNOSIS — N400 Enlarged prostate without lower urinary tract symptoms: Secondary | ICD-10-CM | POA: Diagnosis present

## 2015-01-16 DIAGNOSIS — Z7902 Long term (current) use of antithrombotics/antiplatelets: Secondary | ICD-10-CM

## 2015-01-16 DIAGNOSIS — Z96642 Presence of left artificial hip joint: Secondary | ICD-10-CM | POA: Diagnosis present

## 2015-01-16 DIAGNOSIS — F319 Bipolar disorder, unspecified: Secondary | ICD-10-CM | POA: Diagnosis present

## 2015-01-16 LAB — CBC WITH DIFFERENTIAL/PLATELET
Basophils Absolute: 0 10*3/uL (ref 0.0–0.1)
Basophils Relative: 0 % (ref 0–1)
Eosinophils Absolute: 0 10*3/uL (ref 0.0–0.7)
Eosinophils Relative: 0 % (ref 0–5)
HCT: 47.8 % (ref 39.0–52.0)
Hemoglobin: 16.1 g/dL (ref 13.0–17.0)
Lymphocytes Relative: 5 % — ABNORMAL LOW (ref 12–46)
Lymphs Abs: 0.9 10*3/uL (ref 0.7–4.0)
MCH: 28.2 pg (ref 26.0–34.0)
MCHC: 33.7 g/dL (ref 30.0–36.0)
MCV: 83.9 fL (ref 78.0–100.0)
Monocytes Absolute: 0.7 10*3/uL (ref 0.1–1.0)
Monocytes Relative: 4 % (ref 3–12)
Neutro Abs: 15.2 10*3/uL — ABNORMAL HIGH (ref 1.7–7.7)
Neutrophils Relative %: 91 % — ABNORMAL HIGH (ref 43–77)
Platelets: 285 10*3/uL (ref 150–400)
RBC: 5.7 MIL/uL (ref 4.22–5.81)
RDW: 14.3 % (ref 11.5–15.5)
WBC: 16.8 10*3/uL — ABNORMAL HIGH (ref 4.0–10.5)

## 2015-01-16 LAB — URINALYSIS, ROUTINE W REFLEX MICROSCOPIC
Bilirubin Urine: NEGATIVE
Glucose, UA: NEGATIVE mg/dL
Ketones, ur: NEGATIVE mg/dL
Leukocytes, UA: NEGATIVE
Nitrite: NEGATIVE
Protein, ur: 30 mg/dL — AB
Specific Gravity, Urine: 1.02 (ref 1.005–1.030)
Urobilinogen, UA: 0.2 mg/dL (ref 0.0–1.0)
pH: 5 (ref 5.0–8.0)

## 2015-01-16 LAB — COMPREHENSIVE METABOLIC PANEL
ALT: 26 U/L (ref 17–63)
AST: 25 U/L (ref 15–41)
Albumin: 5.2 g/dL — ABNORMAL HIGH (ref 3.5–5.0)
Alkaline Phosphatase: 79 U/L (ref 38–126)
Anion gap: 12 (ref 5–15)
BUN: 13 mg/dL (ref 6–20)
CO2: 23 mmol/L (ref 22–32)
Calcium: 10.3 mg/dL (ref 8.9–10.3)
Chloride: 103 mmol/L (ref 101–111)
Creatinine, Ser: 1.15 mg/dL (ref 0.61–1.24)
GFR calc Af Amer: >60 mL/min (ref >60)
GFR calc non Af Amer: >60 mL/min (ref >60)
Glucose, Bld: 119 mg/dL — ABNORMAL HIGH (ref 70–99)
Potassium: 3.7 mmol/L (ref 3.5–5.1)
Sodium: 138 mmol/L (ref 135–145)
Total Bilirubin: 0.5 mg/dL (ref 0.3–1.2)
Total Protein: 8.9 g/dL — ABNORMAL HIGH (ref 6.5–8.1)

## 2015-01-16 LAB — LACTIC ACID, PLASMA: Lactic Acid, Venous: 1.3 mmol/L (ref 0.5–2.0)

## 2015-01-16 LAB — URINE MICROSCOPIC-ADD ON

## 2015-01-16 IMAGING — CT CT ABD-PELV W/ CM
2 of 5 series · 16 of 46 positions shown, 18 images · IV contrast (Omnipaque 300)
Comparison: None.

CLINICAL DATA: Abdominal pain. Atraumatic acute onset abdominal
pain. LEFT lower abdominal pain with nausea and vomiting.

EXAM:
CT ABDOMEN AND PELVIS WITH CONTRAST
TECHNIQUE: Multidetector CT imaging of the abdomen and pelvis was performed
using the standard protocol following bolus administration of
intravenous contrast.
CONTRAST:  50mL OMNIPAQUE IOHEXOL 300 MG/ML SOLN, 100mL OMNIPAQUE
IOHEXOL 300 MG/ML SOLN

[Series 2: abd_pel_with 5.0 b40f · axial · 0.75mm/px · z∈[-452,-6]mm · 13 of 101 slices shown, 15 images]
[im 6/101  soft-tissue]
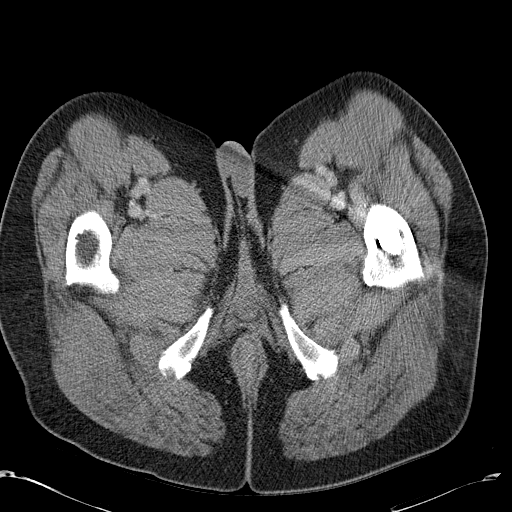
[im 6/101  bone]
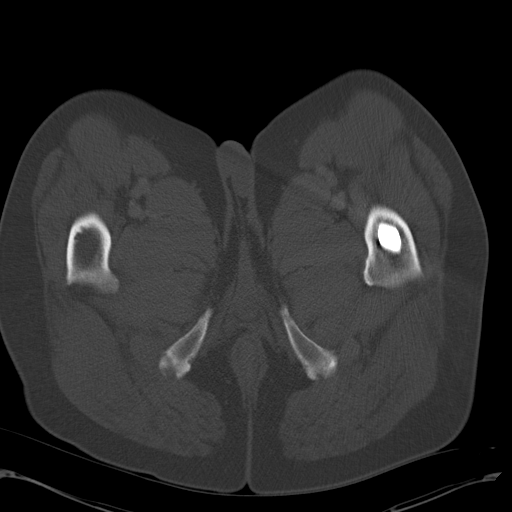
[im 12/101  soft-tissue]
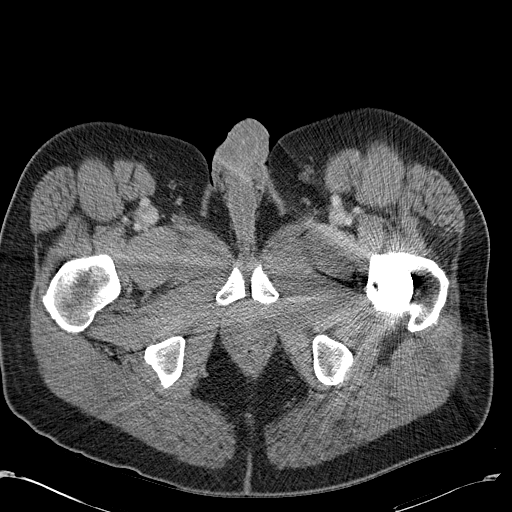
[im 23/101  soft-tissue]
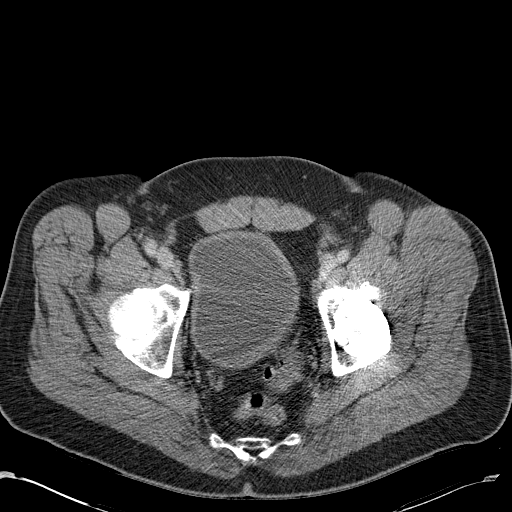
[im 28/101  soft-tissue]
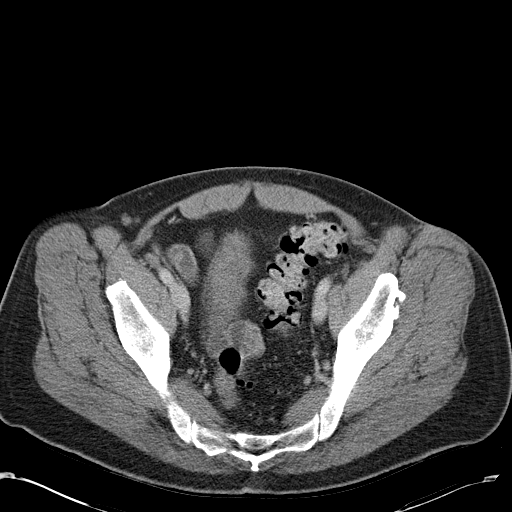
[im 34/101  soft-tissue]
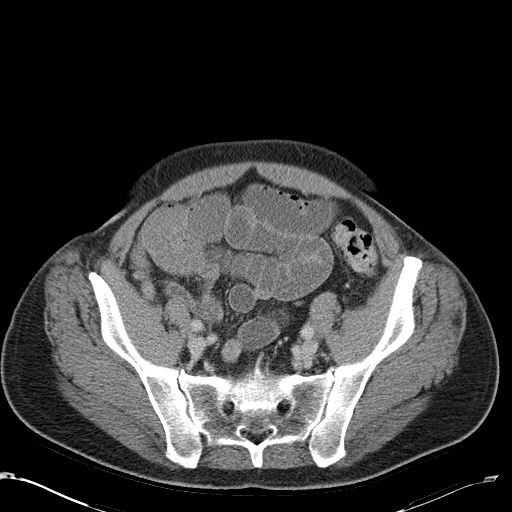
[im 45/101  soft-tissue]
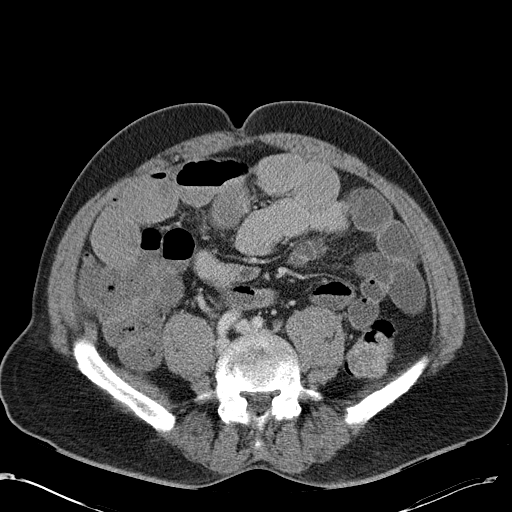
[im 51/101  soft-tissue]
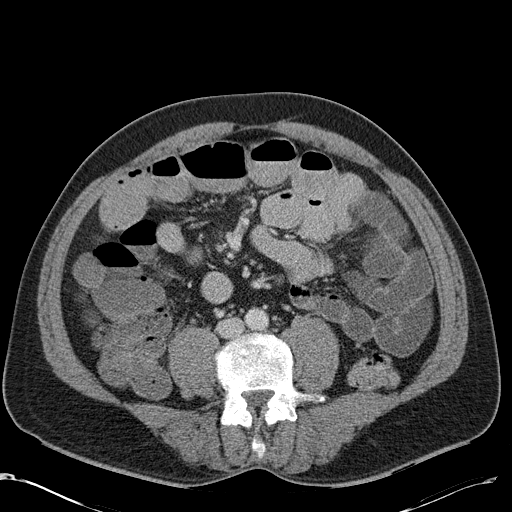
[im 56/101  soft-tissue]
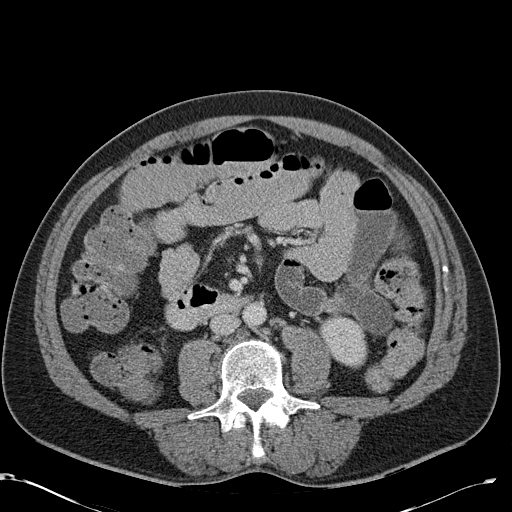
[im 67/101  soft-tissue]
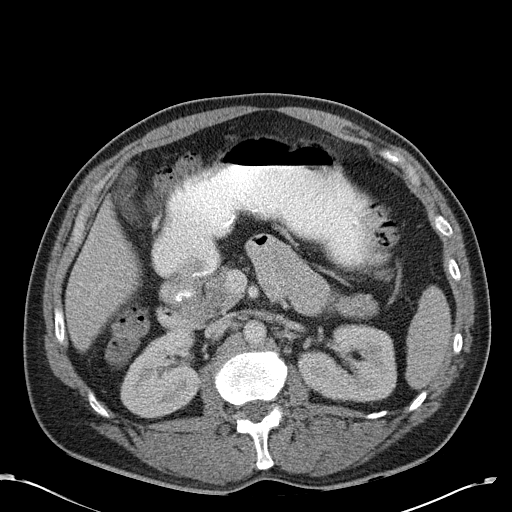
[im 67/101  bone]
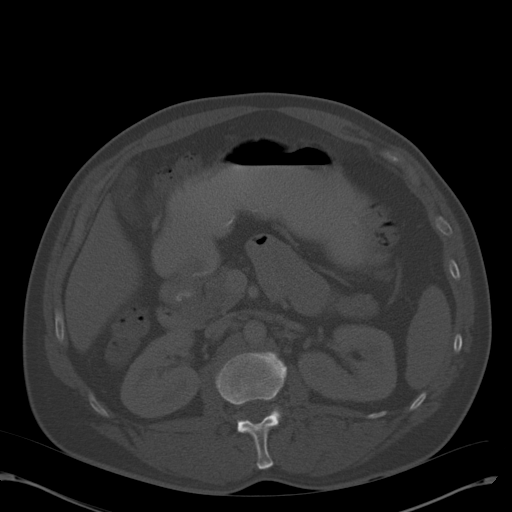
[im 73/101  soft-tissue]
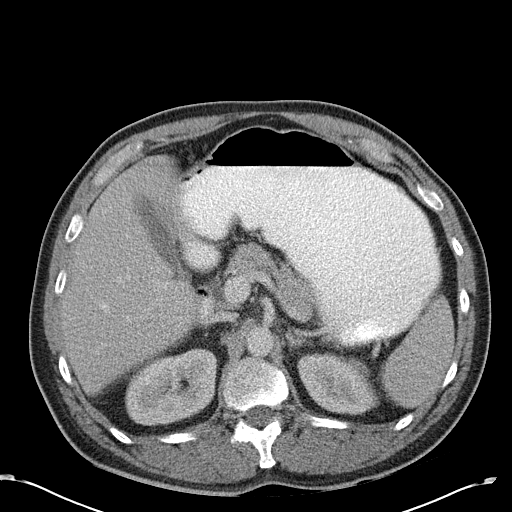
[im 78/101  soft-tissue]
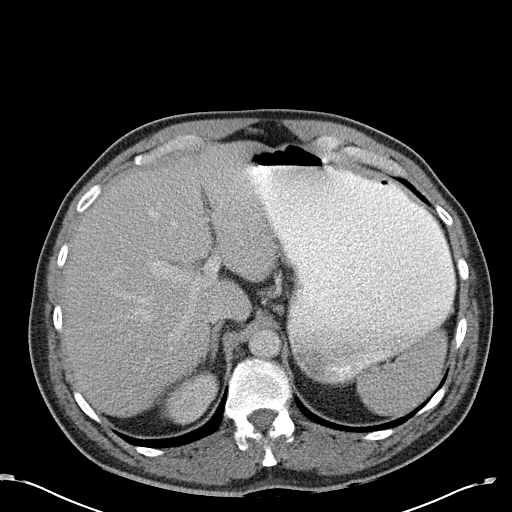
[im 89/101  soft-tissue]
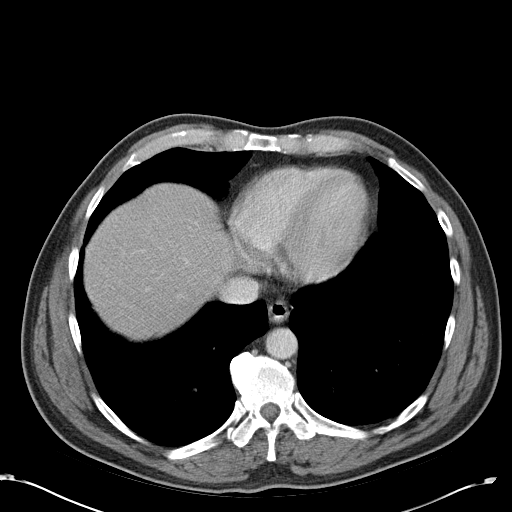
[im 95/101  soft-tissue]
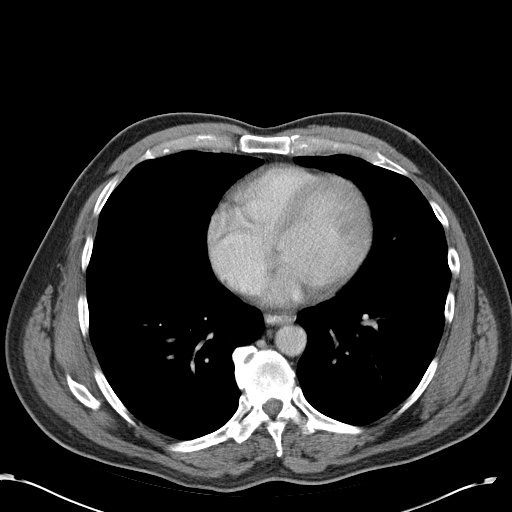

[Series 3: abd_pel_with 3.0 spo cor · coronal · 0.89mm/px · 3 of 97 slices shown]
[im 33/97  soft-tissue]
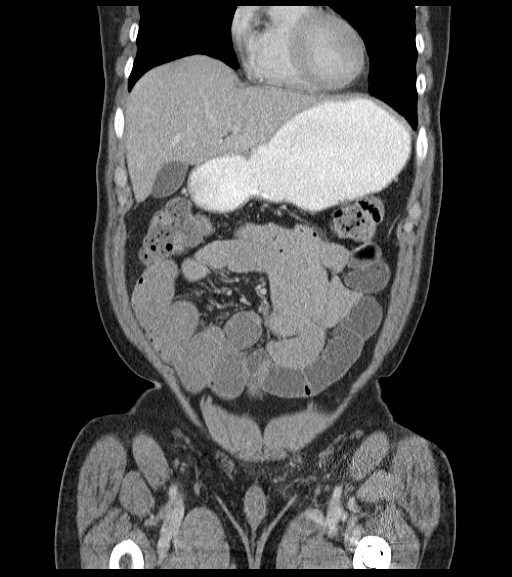
[im 43/97  soft-tissue]
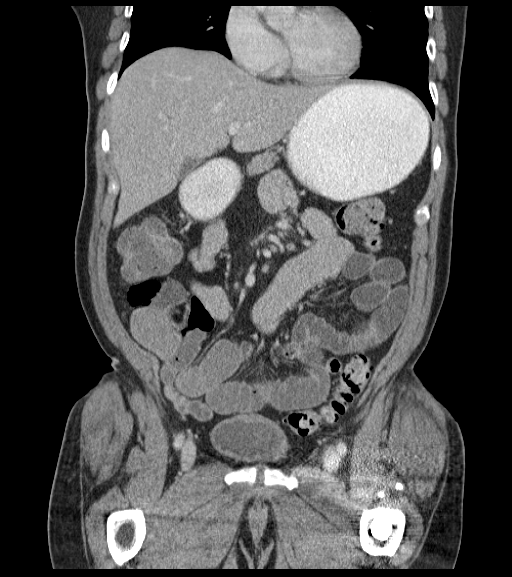
[im 54/97  soft-tissue]
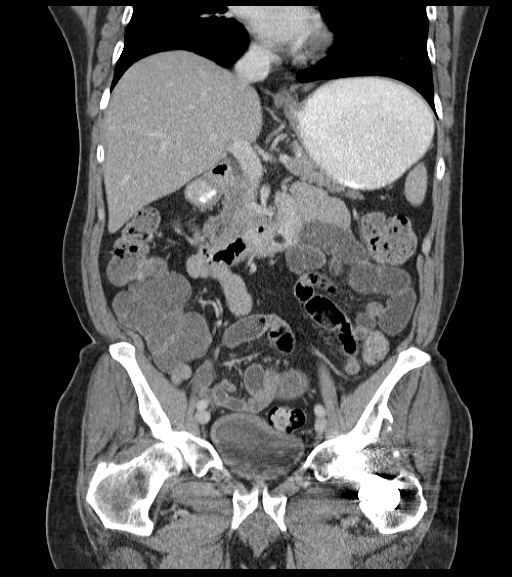

[16 of 46 positions shown; findings below may reference images not displayed]

FINDINGS: Musculoskeletal: No aggressive osseous lesions. Mild dextroconvex
thoracolumbar scoliosis. LEFT total hip arthroplasty.

Lung Bases: Atelectasis.

Liver:  Normal.

Spleen:  Normal.

Gallbladder:  Normal.

Common bile duct:  Normal.

Pancreas:  Normal.

Adrenal glands:  Normal bilaterally.

Kidneys: Normal enhancement and delayed excretion bilaterally. LEFT
ureter appears normal.RIGHT ureter appears normal.

Stomach:  Distended with oral contrast.

Small bowel: Progressive dilation of small bowel extending into the
anatomic pelvis. Distal small bowel is decompressed. Maximal
diameter small bowel loops is 35 mm. There is angulation of several
decompressed loops of small bowel that suggests either internal
hernia or adhesive small bowel obstruction.

Colon: Fluid levels in the proximal colon. Moderate stool burden
extending to the rectosigmoid.

Pelvic Genitourinary: Within normal limits allowing for artifact
from hip arthroplasty.

Peritoneum: No free air.  No free fluid.

Vascular/lymphatic: Normal.

Body Wall: Normal.
IMPRESSION: High-grade small bowel obstruction. Loops of decompressed ileum are
present with angulation suggesting either internal hernia or
adhesive small bowel obstruction.

## 2015-01-16 MED ORDER — SODIUM CHLORIDE 0.9 % IV BOLUS (SEPSIS)
1000.0000 mL | Freq: Once | INTRAVENOUS | Status: AC
  Administered 2015-01-16: 1000 mL via INTRAVENOUS

## 2015-01-16 MED ORDER — ONDANSETRON HCL 4 MG/2ML IJ SOLN
4.0000 mg | Freq: Once | INTRAMUSCULAR | Status: AC
  Administered 2015-01-16: 4 mg via INTRAVENOUS
  Filled 2015-01-16: qty 2

## 2015-01-16 MED ORDER — IOHEXOL 300 MG/ML  SOLN
100.0000 mL | Freq: Once | INTRAMUSCULAR | Status: AC | PRN
  Administered 2015-01-16: 100 mL via INTRAVENOUS

## 2015-01-16 MED ORDER — HYDROMORPHONE HCL 1 MG/ML IJ SOLN
1.0000 mg | Freq: Once | INTRAMUSCULAR | Status: AC
  Administered 2015-01-16: 1 mg via INTRAVENOUS
  Filled 2015-01-16: qty 1

## 2015-01-16 MED ORDER — LIDOCAINE VISCOUS 2 % MT SOLN
15.0000 mL | Freq: Once | OROMUCOSAL | Status: AC
  Administered 2015-01-16: 15 mL via OROMUCOSAL
  Filled 2015-01-16: qty 15

## 2015-01-16 MED ORDER — HYDROMORPHONE HCL 1 MG/ML IJ SOLN
1.0000 mg | Freq: Once | INTRAMUSCULAR | Status: AC
  Administered 2015-01-17: 1 mg via INTRAVENOUS
  Filled 2015-01-16: qty 1

## 2015-01-16 MED ORDER — IOHEXOL 300 MG/ML  SOLN
50.0000 mL | Freq: Once | INTRAMUSCULAR | Status: AC | PRN
  Administered 2015-01-16: 50 mL via ORAL

## 2015-01-16 NOTE — ED Notes (Signed)
Lower abd pain,nv, no diarrhea.

## 2015-01-16 NOTE — ED Provider Notes (Signed)
CSN: [id number]     Arrival date & time 01/16/15  1956 History   First MD Initiated Contact with Patient 01/16/15 2010    This chart was scribed for Virgel Manifold, MD by Terressa Koyanagi, ED Scribe. This patient was seen in room APA18/APA18 and the patient's care was started at 8:15 PM.  Chief Complaint  Patient presents with  . Abdominal Pain   The history is provided by the patient. No language interpreter was used.   PCP: Delphina Cahill, MD HPI Comments: BEDFORD WINSOR is a 59 y.o. male, accompanied by his wife, with PMH noted below, who presents to the Emergency Department complaining of sharp, atraumatic, sudden onset, 10 out of 10,  left sided lower abd pain with associated nausea and vomiting. Pt notes certain positions make the pain worse. Pt reports taking Alkaseltzer and tums at home without relief. While pt notes prior episodes of abd pain, he denies any Hx of similar Sx. Pt denies urinary Sx, diarrhea, Hx of and surgeries, Hx of diverticulitis, SOB, fever or any other Sx at this time.  Past Medical History  Diagnosis Date  . Asthma     ACTIVITY INDUCED  . Sinus drainage   . Arthritis   . GERD (gastroesophageal reflux disease)   . Bipolar 1 disorder   . Neck pain, chronic   . Cellulitis 05/2014    right side face  . 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 BPH (benign prostatic hyperplasia)    Past Surgical History  Procedure Laterality Date  . Nose surgery    . Total hip arthroplasty Left 01/03/2013    Procedure: TOTAL HIP ARTHROPLASTY ANTERIOR APPROACH;  Surgeon: Gearlean Alf, MD;  Location: [facility];  Service: Orthopedics;  Laterality: Left;  . Dental implant  09/26/2014    left lower dental implant  . Total knee arthroplasty Right 10/06/2014    Procedure: RIGHT TOTAL KNEE ARTHROPLASTY;  Surgeon: Gearlean Alf, MD;  Location: [facility];  Service: Orthopedics;  Laterality: Right;  . Radiology with anesthesia N/A 12/29/2014    Procedure:  Embolization;  Surgeon: Consuella Lose, MD;  Location: [place];  Service: Radiology;  Laterality: N/A;  . Cerebral stent     Family History  Problem Relation Age of Onset  . Arrhythmia Mother     Has pacemaker  . Colon cancer Father    History  Substance Use Topics  . Smoking status: Former Smoker    Quit date: 05/29/2014  . Smokeless tobacco: Not on file  . Alcohol Use: No    Review of Systems  Constitutional: Negative for fever and chills.  Respiratory: Negative for shortness of breath.   Gastrointestinal: Positive for nausea, vomiting and abdominal pain. Negative for diarrhea.  Genitourinary: Negative for difficulty urinating.  Neurological: Negative for speech difficulty.  Psychiatric/Behavioral: Negative for confusion.  All other systems reviewed and are negative.  Allergies  Aspirin and Penicillins  Home Medications   Prior to Admission medications   Medication Sig Start Date End Date Taking? Authorizing Provider  aspirin 325 MG tablet Take 325 mg by mouth once.    Historical Provider, MD  chlorhexidine (PERIDEX) 0.12 % solution Use as directed 15 mLs in the mouth or throat 2 (two) times daily.    Historical Provider, MD  clindamycin (CLEOCIN) 300 MG capsule Take 600 mg by mouth daily as needed. 11/14/14   Historical Provider, MD  clopidogrel (PLAVIX) 75 MG tablet Take 75 mg  by mouth daily. 12/19/14   Historical Provider, MD  dexlansoprazole (DEXILANT) 60 MG capsule Take 60 mg by mouth every morning.     Historical Provider, MD  divalproex (DEPAKOTE ER) 500 MG 24 hr tablet Take 1,000 mg by mouth at bedtime.     Historical Provider, MD  doxycycline (VIBRAMYCIN) 100 MG capsule Take 100 mg by mouth 2 (two) times daily.    Historical Provider, MD  Dutasteride-Tamsulosin HCl (JALYN) 0.5-0.4 MG CAPS Take 1 capsule by mouth daily at 12 noon.     Historical Provider, MD  HYDROcodone-acetaminophen (NORCO) 7.5-325 MG per tablet Take 1-2 tablets by mouth every 6 (six) hours as  needed for moderate pain.    Historical Provider, MD  ipratropium (ATROVENT) 0.03 % nasal spray Place 2 sprays into both nostrils every 12 (twelve) hours as needed for rhinitis.    Historical Provider, MD  lamoTRIgine (LAMICTAL) 200 MG tablet Take 200 mg by mouth at bedtime.    Historical Provider, MD  levalbuterol [facility] HFA) 45 MCG/ACT inhaler Inhale 1-2 puffs into the lungs every 6 (six) hours as needed for wheezing.     Historical Provider, MD  lisdexamfetamine (VYVANSE) 70 MG capsule Take 70 mg by mouth every morning.     Historical Provider, MD  loperamide (IMODIUM) 2 MG capsule Take 1 capsule (2 mg total) by mouth 3 (three) times daily as needed for diarrhea or loose stools. 06/01/14   Bonnielee Haff, MD  methocarbamol (ROBAXIN) 500 MG tablet Take 1 tablet (500 mg total) by mouth every 6 (six) hours as needed for muscle spasms. 10/09/14   [name], PA-C  montelukast (SINGULAIR) 10 MG tablet Take 10 mg by mouth every morning.     Historical Provider, MD  Oxycodone HCl 10 MG TABS Take 1-2 tablets (10-20 mg total) by mouth every 3 (three) hours as needed. Patient not taking: Reported on 10/25/2014 10/09/14   [name], PA-C  promethazine (PHENERGAN) 25 MG tablet Take 12.5 mg by mouth every 6 (six) hours as needed for nausea or vomiting.    Historical Provider, MD  RABEprazole (ACIPHEX) 20 MG tablet Take 20 mg by mouth every evening.     Historical Provider, MD  tadalafil (CIALIS) 20 MG tablet Take 20 mg by mouth daily as needed for erectile dysfunction.    Historical Provider, MD  testosterone cypionate (DEPOTESTOTERONE CYPIONATE) 100 MG/ML injection Inject 100 mg into the muscle every 14 (fourteen) days. For IM use only    Historical Provider, MD  traMADol (ULTRAM) 50 MG tablet Take 1-2 tablets (50-100 mg total) by mouth every 6 (six) hours as needed for moderate pain. Patient not taking: Reported on 12/15/2014 10/09/14   [name], PA-C   Triage Vitals: BP 147/100 mmHg  Pulse 115   Temp(Src) 97.9 F (36.6 C) (Oral)  Resp 18  Ht 5\' 11"  (1.803 m)  Wt 205 lb (92.987 kg)  BMI 28.60 kg/m2  SpO2 97% Physical Exam  Constitutional: He is oriented to person, place, and time. He appears well-developed and well-nourished. No distress.  Drowsy but arousable.   HENT:  Head: Normocephalic and atraumatic.  Eyes: Conjunctivae and EOM are normal.  Neck: Neck supple.  Cardiovascular: Normal rate.   Pulmonary/Chest: Effort normal. No respiratory distress.  Abdominal: There is tenderness (diffusely tender). There is guarding (voluntary guarding). There is no rebound.  Musculoskeletal: Normal range of motion.  Neurological: He is alert and oriented to person, place, and time.  Skin: Skin is warm and dry.  Nursing  note and vitals reviewed.   ED Course  Procedures (including critical care time) DIAGNOSTIC STUDIES: Oxygen Saturation is 97% on RA, nl by my interpretation.    COORDINATION OF CARE: 8:19 PM-Discussed treatment plan which includes meds, labs, imaging, UA with pt at bedside and pt agreed to plan.   Labs Review Labs Reviewed  CBC WITH DIFFERENTIAL/PLATELET - Abnormal; Notable for the following:    WBC 16.8 (*)    Neutrophils Relative % 91 (*)    Neutro Abs 15.2 (*)    Lymphocytes Relative 5 (*)    All other components within normal limits  COMPREHENSIVE METABOLIC PANEL - Abnormal; Notable for the following:    Glucose, Bld 119 (*)    Total Protein 8.9 (*)    Albumin 5.2 (*)    All other components within normal limits  URINALYSIS, ROUTINE W REFLEX MICROSCOPIC - Abnormal; Notable for the following:    Hgb urine dipstick TRACE (*)    Protein, ur 30 (*)    All other components within normal limits  URINE MICROSCOPIC-ADD ON - Abnormal; Notable for the following:    Squamous Epithelial / LPF FEW (*)    Casts GRANULAR CAST (*)    All other components within normal limits  LACTIC ACID, PLASMA    Imaging Review Ct Abdomen Pelvis W Contrast  01/16/2015    CLINICAL DATA:  Abdominal pain. Atraumatic acute onset abdominal pain. LEFT lower abdominal pain with nausea and vomiting.  EXAM: CT ABDOMEN AND PELVIS WITH CONTRAST  TECHNIQUE: Multidetector CT imaging of the abdomen and pelvis was performed using the standard protocol following bolus administration of intravenous contrast.  CONTRAST:  68mL OMNIPAQUE IOHEXOL 300 MG/ML SOLN, 139mL OMNIPAQUE IOHEXOL 300 MG/ML SOLN  COMPARISON:  None.  FINDINGS: Musculoskeletal: No aggressive osseous lesions. Mild dextroconvex thoracolumbar scoliosis. LEFT total hip arthroplasty.  Lung Bases: Atelectasis.  Liver:  Normal.  Spleen:  Normal.  Gallbladder:  Normal.  Common bile duct:  Normal.  Pancreas:  Normal.  Adrenal glands:  Normal bilaterally.  Kidneys: Normal enhancement and delayed excretion bilaterally. LEFT ureter appears normal.RIGHT ureter appears normal.  Stomach:  Distended with oral contrast.  Small bowel: Progressive dilation of small bowel extending into the anatomic pelvis. Distal small bowel is decompressed. Maximal diameter small bowel loops is 35 mm. There is angulation of several decompressed loops of small bowel that suggests either internal hernia or adhesive small bowel obstruction.  Colon: Fluid levels in the proximal colon. Moderate stool burden extending to the rectosigmoid.  Pelvic Genitourinary: Within normal limits allowing for artifact from hip arthroplasty.  Peritoneum: No free air.  No free fluid.  Vascular/lymphatic: Normal.  Body Wall: Normal.  IMPRESSION: High-grade small bowel obstruction. Loops of decompressed ileum are present with angulation suggesting either internal hernia or adhesive small bowel obstruction.   Electronically Signed   By: Dereck Ligas M.D.   On: 01/16/2015 21:56     EKG Interpretation None      MDM   Final diagnoses:  SBO (small bowel obstruction)   59 year old male with abdominal pain. Diffuse, but worse on the left side. Significantly tender on exam. No prior  history of abdominal surgery. CT somewhat surprisingly significant for high-grade bowel obstruction. NG tube. Discussed with Dr. Arnoldo Morale, general surgery. Will evaluate patient in the morning. Medicine admission.  I personally preformed the services scribed in my presence. The recorded information has been reviewed is accurate. Virgel Manifold, MD.     Virgel Manifold, MD 01/16/15 [phone]

## 2015-01-16 NOTE — H&P (Signed)
PCP:   Delphina Cahill, MD   Chief Complaint:  Abdominal pain  HPI: 59 year old male who   has a past medical history of Asthma; Sinus drainage; Arthritis; GERD (gastroesophageal reflux disease); Bipolar 1 disorder; Neck pain, chronic; Cellulitis (05/2014); Aneurysm; and BPH (benign prostatic hyperplasia). Today presents to the hospital with sharp sudden onset left lower abdominal pain associated with nausea and vomiting. 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 Patient was prescribed clindamycin 1 tablet 3 times a day for 10 days. And patient has been taking the antibiotic for past 10 days. Denies any fever, no chest pain or shortness of breath. No dysuria urgency frequency of urination. In the ED,  CT scan abdomen pelvis was done which showed high-grade small bowel obstruction, loops of decompressed ileum present with angulation suggesting either internal hernia order this if small bowel obstruction. Gen. surgery was consulted by the ED physician, Dr. Arnoldo Morale recommend putting NG tube and he will see the patient in a.m.  Allergies:   Allergies  Allergen Reactions  . Aspirin Other (See Comments)    Do not take with depakote.   Marland Kitchen Penicillins Rash      Past Medical History  Diagnosis Date  . Asthma     ACTIVITY INDUCED  . Sinus drainage   . Arthritis   . GERD (gastroesophageal reflux disease)   . Bipolar 1 disorder   . Neck pain, chronic   . Cellulitis 05/2014    right side face  . 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 BPH (benign prostatic hyperplasia)     Past Surgical History  Procedure Laterality Date  . Nose surgery    . Total hip arthroplasty Left 01/03/2013    Procedure: TOTAL HIP ARTHROPLASTY ANTERIOR APPROACH;  Surgeon: Gearlean Alf, MD;  Location: [facility];  Service: Orthopedics;  Laterality: Left;  . Dental implant  09/26/2014    left lower  dental implant  . Total knee arthroplasty Right 10/06/2014    Procedure: RIGHT TOTAL KNEE ARTHROPLASTY;  Surgeon: Gearlean Alf, MD;  Location: [facility];  Service: Orthopedics;  Laterality: Right;  . Radiology with anesthesia N/A 12/29/2014    Procedure: Embolization;  Surgeon: Consuella Lose, MD;  Location: [place];  Service: Radiology;  Laterality: N/A;  . Cerebral stent      Prior to Admission medications   Medication Sig Start Date End Date Taking? Authorizing Provider  aspirin 325 MG tablet Take 325 mg by mouth once.   Yes Historical Provider, MD  aspirin-sod bicarb-citric acid (ALKA-SELTZER) 325 MG TBEF tablet Take 325 mg by mouth every 6 (six) hours as needed.   Yes Historical Provider, MD  chlorhexidine (PERIDEX) 0.12 % solution Use as directed 15 mLs in the mouth or throat 2 (two) times daily.   Yes Historical Provider, MD  clindamycin (CLEOCIN) 300 MG capsule Take 300 mg by mouth 3 (three) times daily.  11/14/14  Yes Historical Provider, MD  clopidogrel (PLAVIX) 75 MG tablet Take 75 mg by mouth daily. 12/19/14  Yes Historical Provider, MD  dexlansoprazole (DEXILANT) 60 MG capsule Take 60 mg by mouth every morning.    Yes Historical Provider, MD  divalproex (DEPAKOTE ER) 500 MG 24 hr tablet Take 500 mg by mouth at bedtime.    Yes Historical Provider, MD  Dutasteride-Tamsulosin HCl (JALYN) 0.5-0.4 MG CAPS Take 1 capsule by mouth daily at 12 noon.  Yes Historical Provider, MD  HYDROcodone-acetaminophen (NORCO) 7.5-325 MG per tablet Take 1-2 tablets by mouth every 6 (six) hours as needed for moderate pain.   Yes Historical Provider, MD  ipratropium (ATROVENT) 0.03 % nasal spray Place 2 sprays into both nostrils every 12 (twelve) hours as needed for rhinitis.   Yes Historical Provider, MD  lamoTRIgine (LAMICTAL) 200 MG tablet Take 200 mg by mouth at bedtime.   Yes Historical Provider, MD  levalbuterol [facility] HFA) 45 MCG/ACT inhaler Inhale 1-2 puffs into the lungs every 6 (six) hours as  needed for wheezing.    Yes Historical Provider, MD  lisdexamfetamine (VYVANSE) 70 MG capsule Take 70 mg by mouth every morning.    Yes Historical Provider, MD  montelukast (SINGULAIR) 10 MG tablet Take 10 mg by mouth every morning.    Yes Historical Provider, MD  promethazine (PHENERGAN) 25 MG tablet Take 12.5 mg by mouth every 6 (six) hours as needed for nausea or vomiting.   Yes Historical Provider, MD  RABEprazole (ACIPHEX) 20 MG tablet Take 20 mg by mouth every evening.    Yes Historical Provider, MD  tadalafil (CIALIS) 20 MG tablet Take 20 mg by mouth daily as needed for erectile dysfunction.   Yes Historical Provider, MD  testosterone cypionate (DEPOTESTOTERONE CYPIONATE) 200 MG/ML injection Inject 200 mg into the muscle every 14 (fourteen) days.  12/16/14  Yes Historical Provider, MD  loperamide (IMODIUM) 2 MG capsule Take 1 capsule (2 mg total) by mouth 3 (three) times daily as needed for diarrhea or loose stools. Patient not taking: Reported on 01/16/2015 06/01/14   Bonnielee Haff, MD  methocarbamol (ROBAXIN) 500 MG tablet Take 1 tablet (500 mg total) by mouth every 6 (six) hours as needed for muscle spasms. Patient not taking: Reported on 01/16/2015 10/09/14   [name], PA-C  Oxycodone HCl 10 MG TABS Take 1-2 tablets (10-20 mg total) by mouth every 3 (three) hours as needed. Patient not taking: Reported on 10/25/2014 10/09/14   [name], PA-C  traMADol (ULTRAM) 50 MG tablet Take 1-2 tablets (50-100 mg total) by mouth every 6 (six) hours as needed for moderate pain. Patient not taking: Reported on 12/15/2014 10/09/14   [name], PA-C    Social History:  reports that he quit smoking about 7 months ago. He does not have any smokeless tobacco history on file. He reports that he does not drink alcohol or use illicit drugs.  Family History  Problem Relation Age of Onset  . Arrhythmia Mother     Has pacemaker  . Colon cancer Father      All the positives are listed in BOLD  Review of  Systems:  HEENT: Headache, blurred vision, runny nose, sore throat Neck: Hypothyroidism, hyperthyroidism,,lymphadenopathy Chest : Shortness of breath, history of COPD, Asthma Heart : Chest pain, history of coronary arterey disease GI:  Nausea, vomiting, diarrhea, constipation, GERD GU: Dysuria, urgency, frequency of urination, hematuria Neuro: Stroke, seizures, syncope Psych: Depression, anxiety, hallucinations, bipolar disorder   Physical Exam: Blood pressure 124/78, pulse 102, temperature 97.6 F (36.4 C), temperature source Oral, resp. rate 20, height 5\' 11"  (1.803 m), weight 92.987 kg (205 lb), SpO2 96 %. Constitutional:   Patient is a well-developed and well-nourished *male* in no acute distress and cooperative with exam. Head: Normocephalic and atraumatic Mouth: Mucus membranes moist Eyes: PERRL, EOMI, conjunctivae normal Neck: Supple, No Thyromegaly Cardiovascular: RRR, S1 normal, S2 normal Pulmonary/Chest: CTAB, no wheezes, rales, or rhonchi Abdominal: Soft. Generalized tenderness to palpationr, mildly  distended, bowel sounds are absent, no masses, organomegaly, or guarding present.  Neurological: A&O x3, Strength is normal and symmetric bilaterally, cranial nerve II-XII are grossly intact, no focal motor deficit, sensory intact to light touch bilaterally.  Extremities : No Cyanosis, Clubbing or Edema  Labs on Admission:  Basic Metabolic Panel:  Recent Labs Lab 01/16/15 2035  NA 138  K 3.7  CL 103  CO2 23  GLUCOSE 119*  BUN 13  CREATININE 1.15  CALCIUM 10.3   Liver Function Tests:  Recent Labs Lab 01/16/15 2035  AST 25  ALT 26  ALKPHOS 79  BILITOT 0.5  PROT 8.9*  ALBUMIN 5.2*   No results for input(s): LIPASE, AMYLASE in the last 168 hours. No results for input(s): AMMONIA in the last 168 hours. CBC:  Recent Labs Lab 01/16/15 2035  WBC 16.8*  NEUTROABS 15.2*  HGB 16.1  HCT 47.8  MCV 83.9  PLT 285    Radiological Exams on Admission: Ct  Abdomen Pelvis W Contrast  01/16/2015   CLINICAL DATA:  Abdominal pain. Atraumatic acute onset abdominal pain. LEFT lower abdominal pain with nausea and vomiting.  EXAM: CT ABDOMEN AND PELVIS WITH CONTRAST  TECHNIQUE: Multidetector CT imaging of the abdomen and pelvis was performed using the standard protocol following bolus administration of intravenous contrast.  CONTRAST:  6mL OMNIPAQUE IOHEXOL 300 MG/ML SOLN, 142mL OMNIPAQUE IOHEXOL 300 MG/ML SOLN  COMPARISON:  None.  FINDINGS: Musculoskeletal: No aggressive osseous lesions. Mild dextroconvex thoracolumbar scoliosis. LEFT total hip arthroplasty.  Lung Bases: Atelectasis.  Liver:  Normal.  Spleen:  Normal.  Gallbladder:  Normal.  Common bile duct:  Normal.  Pancreas:  Normal.  Adrenal glands:  Normal bilaterally.  Kidneys: Normal enhancement and delayed excretion bilaterally. LEFT ureter appears normal.RIGHT ureter appears normal.  Stomach:  Distended with oral contrast.  Small bowel: Progressive dilation of small bowel extending into the anatomic pelvis. Distal small bowel is decompressed. Maximal diameter small bowel loops is 35 mm. There is angulation of several decompressed loops of small bowel that suggests either internal hernia or adhesive small bowel obstruction.  Colon: Fluid levels in the proximal colon. Moderate stool burden extending to the rectosigmoid.  Pelvic Genitourinary: Within normal limits allowing for artifact from hip arthroplasty.  Peritoneum: No free air.  No free fluid.  Vascular/lymphatic: Normal.  Body Wall: Normal.  IMPRESSION: High-grade small bowel obstruction. Loops of decompressed ileum are present with angulation suggesting either internal hernia or adhesive small bowel obstruction.   Electronically Signed   By: Dereck Ligas M.D.   On: 01/16/2015 21:56    EKG: Independently reviewed. Normal sinus rhythm   Assessment/Plan Active Problems:   Bipolar 1 disorder   Cerebral aneurysm   SBO (small bowel  obstruction)  Small bowel obstruction Will admit the patient, NG tube has been ordered by the ED physician. Gen. surgery has been consulted, Dr. Arnoldo Morale to see the patient in a.m. Will keep the patient nothing by mouth continue IV fluids. Patient recently finished a course of clindamycin, after patient had dental implants placed. If patient does diarrhea consider stool for C. difficile. Will repeat abdominal x-ray in a.m.  History of embolization of internal carotid artery aneurysm Stable  Bipolar disorder Stable, medication on hold as patient is nothing by mouth  DVT prophylaxis  Lovenox   Code status:Full code  Family discussion: Admission, patients condition and plan of care including tests being ordered have been discussed with the patient and *his wife at bedside* who indicate understanding  and agree with the plan and Code Status.   Time Spent on Admission: 60 min  [facility] Pager: [phone] 01/16/2015, 11:24 PM  If 7PM-7AM, please contact night-coverage  [email or web address]  Password [email or web address]

## 2015-01-17 ENCOUNTER — Inpatient Hospital Stay (HOSPITAL_COMMUNITY): Payer: 59

## 2015-01-17 ENCOUNTER — Encounter (HOSPITAL_COMMUNITY): Payer: Self-pay | Admitting: *Deleted

## 2015-01-17 DIAGNOSIS — K5669 Other intestinal obstruction: Secondary | ICD-10-CM

## 2015-01-17 LAB — COMPREHENSIVE METABOLIC PANEL
ALBUMIN: 3.8 g/dL (ref 3.5–5.0)
ALT: 18 U/L (ref 17–63)
ANION GAP: 6 (ref 5–15)
AST: 18 U/L (ref 15–41)
Alkaline Phosphatase: 54 U/L (ref 38–126)
BUN: 14 mg/dL (ref 6–20)
CHLORIDE: 105 mmol/L (ref 101–111)
CO2: 30 mmol/L (ref 22–32)
CREATININE: 0.89 mg/dL (ref 0.61–1.24)
Calcium: 8.9 mg/dL (ref 8.9–10.3)
GFR calc Af Amer: >60 mL/min (ref >60)
GFR calc non Af Amer: >60 mL/min (ref >60)
Glucose, Bld: 98 mg/dL (ref 70–99)
POTASSIUM: 4.1 mmol/L (ref 3.5–5.1)
Sodium: 141 mmol/L (ref 135–145)
Total Bilirubin: 0.5 mg/dL (ref 0.3–1.2)
Total Protein: 6.5 g/dL (ref 6.5–8.1)

## 2015-01-17 LAB — CBC
HCT: 40.8 % (ref 39.0–52.0)
HEMOGLOBIN: 13.2 g/dL (ref 13.0–17.0)
MCH: 27.6 pg (ref 26.0–34.0)
MCHC: 32.4 g/dL (ref 30.0–36.0)
MCV: 85.4 fL (ref 78.0–100.0)
Platelets: 259 10*3/uL (ref 150–400)
RBC: 4.78 MIL/uL (ref 4.22–5.81)
RDW: 14.5 % (ref 11.5–15.5)
WBC: 6.6 10*3/uL (ref 4.0–10.5)

## 2015-01-17 IMAGING — DX DG ABDOMEN 2V
2 series · 2 of 2 positions shown · non-contrast
Comparison: CT, [DATE]

CLINICAL DATA: Followup small bowel obstruction.  Abdominal pain.

EXAM:
ABDOMEN - 2 VIEW

[abdomen erect]
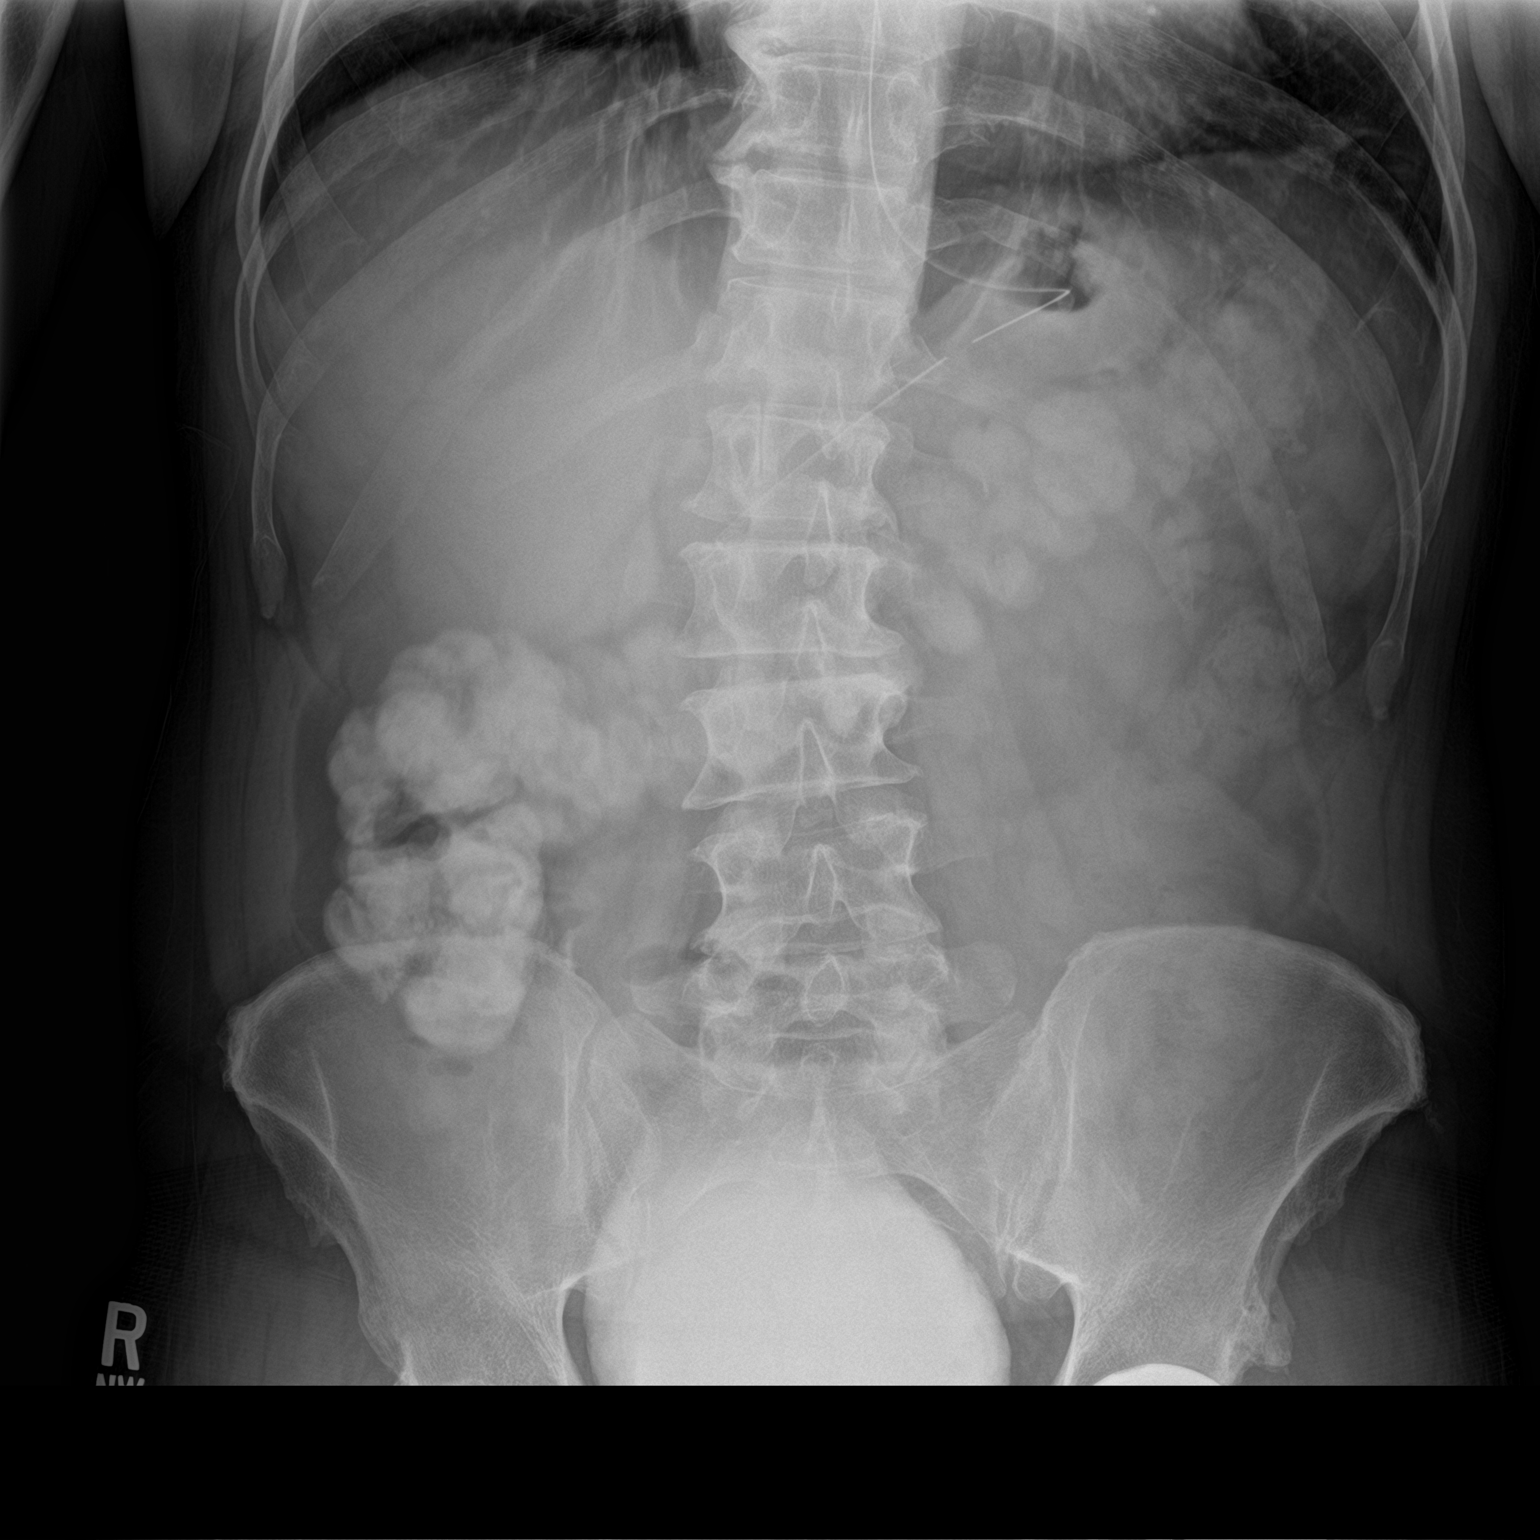

[abdomen supine]
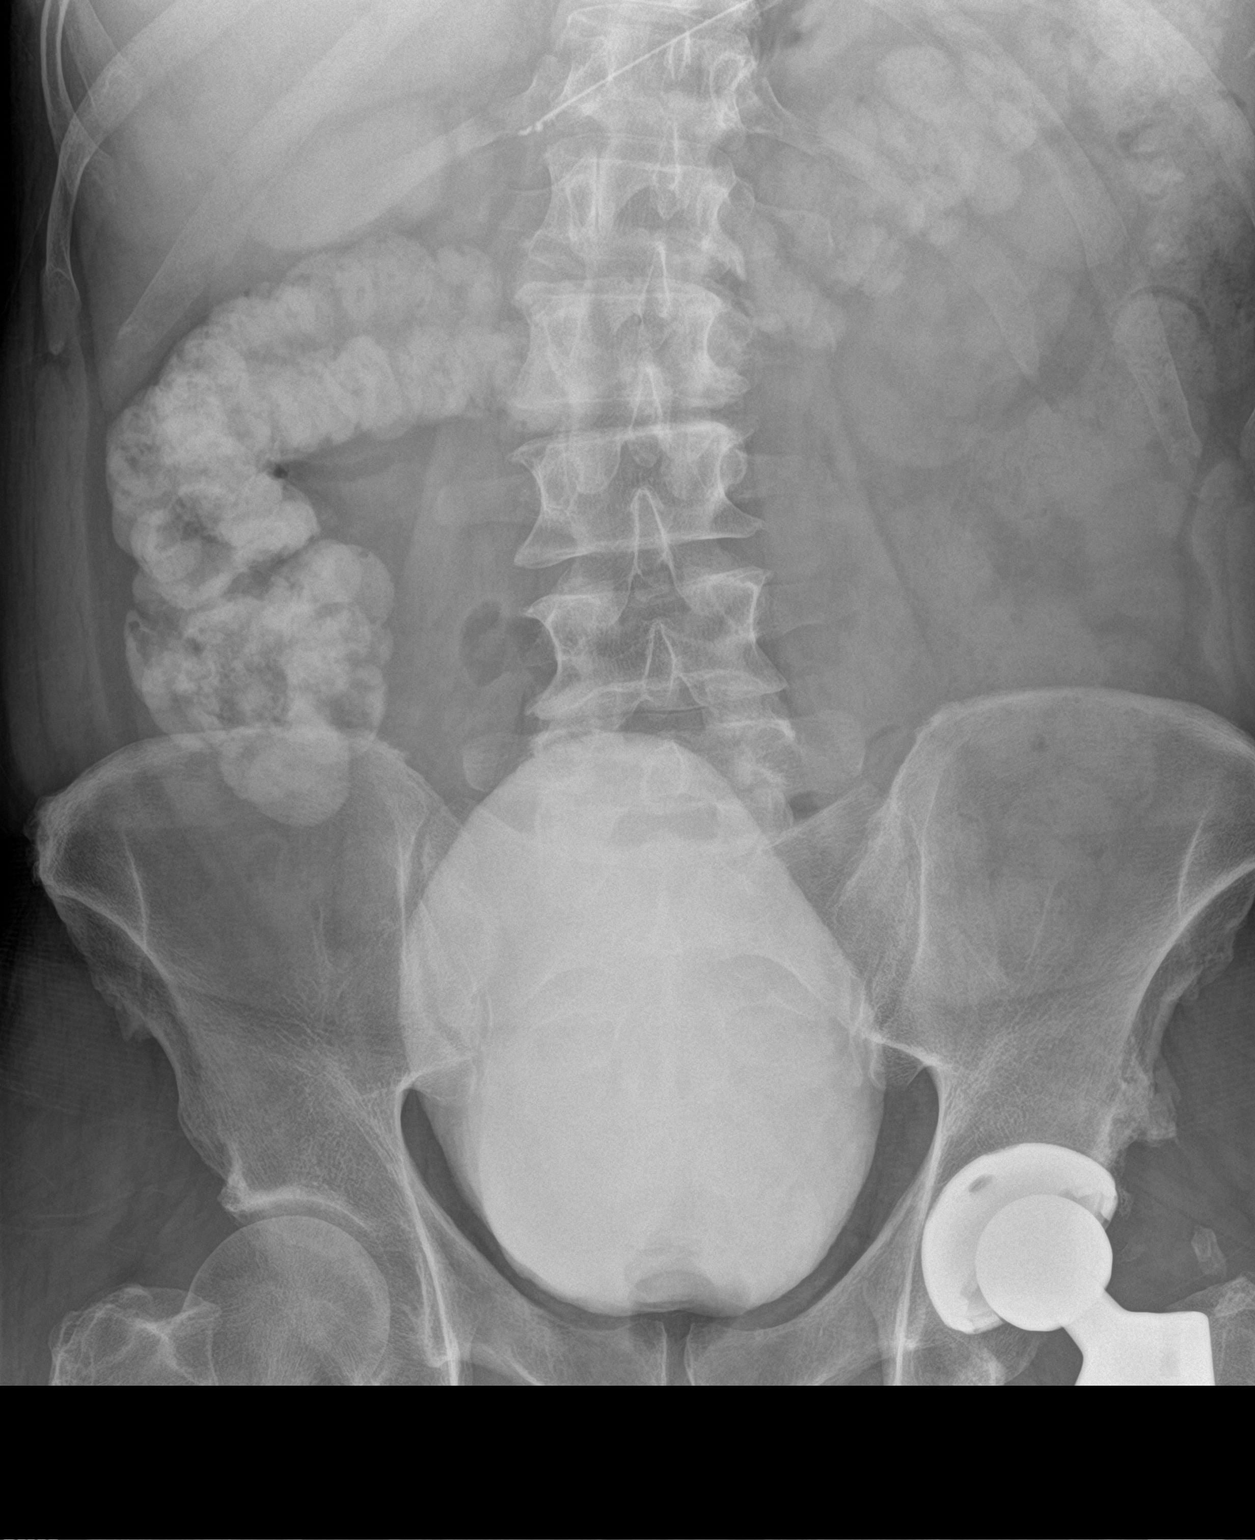

[2 of 2 positions shown; findings below may reference images not displayed]

FINDINGS: There is no small bowel dilation on the current radiograph to
suggest residual small bowel obstruction. Residual contrast is noted
in a normal caliber colon. There is residual intravenous contrast in
the bladder.

Soft tissues are unremarkable. No evidence renal or ureteral stones.

NG tube well positioned with its tip in the distal stomach.

There are degenerative changes of the visualized spine. Left hip
prosthesis is incompletely imaged but appears well seated and
aligned.
IMPRESSION: 1. Resolved small bowel obstruction.  No new abnormalities.

## 2015-01-17 MED ORDER — ONDANSETRON HCL 4 MG/2ML IJ SOLN: 4.0000 mg | Freq: Four times a day (QID) | INTRAMUSCULAR | Status: DC | PRN

## 2015-01-17 MED ORDER — HYDROMORPHONE HCL 1 MG/ML IJ SOLN
1.0000 mg | INTRAMUSCULAR | Status: DC | PRN
  Administered 2015-01-17 (×2): 1 mg via INTRAVENOUS
  Filled 2015-01-17 (×2): qty 1

## 2015-01-17 MED ORDER — KETOROLAC TROMETHAMINE 30 MG/ML IJ SOLN
30.0000 mg | Freq: Once | INTRAMUSCULAR | Status: AC
  Administered 2015-01-17: 30 mg via INTRAVENOUS
  Filled 2015-01-17: qty 1

## 2015-01-17 MED ORDER — PANTOPRAZOLE SODIUM 40 MG IV SOLR
40.0000 mg | INTRAVENOUS | Status: DC
  Administered 2015-01-17 – 2015-01-18 (×2): 40 mg via INTRAVENOUS
  Filled 2015-01-17 (×2): qty 40

## 2015-01-17 MED ORDER — ONDANSETRON HCL 4 MG PO TABS: 4.0000 mg | ORAL_TABLET | Freq: Four times a day (QID) | ORAL | Status: DC | PRN

## 2015-01-17 MED ORDER — ENOXAPARIN SODIUM 40 MG/0.4ML ~~LOC~~ SOLN
40.0000 mg | SUBCUTANEOUS | Status: DC
  Administered 2015-01-18: 40 mg via SUBCUTANEOUS
  Filled 2015-01-17 (×2): qty 0.4

## 2015-01-17 MED ORDER — SODIUM CHLORIDE 0.9 % IV SOLN
INTRAVENOUS | Status: DC
  Administered 2015-01-17 – 2015-01-18 (×4): via INTRAVENOUS

## 2015-01-17 MED ORDER — HYDROMORPHONE HCL 1 MG/ML IJ SOLN
1.0000 mg | INTRAMUSCULAR | Status: DC | PRN
  Administered 2015-01-17 – 2015-01-18 (×10): 1 mg via INTRAVENOUS
  Filled 2015-01-17 (×10): qty 1

## 2015-01-17 MED ORDER — METOCLOPRAMIDE HCL 5 MG/ML IJ SOLN
10.0000 mg | Freq: Once | INTRAMUSCULAR | Status: AC
  Administered 2015-01-17: 10 mg via INTRAVENOUS
  Filled 2015-01-17: qty 2

## 2015-01-17 MED ORDER — SODIUM CHLORIDE 0.9 % IV SOLN
INTRAVENOUS | Status: DC
  Administered 2015-01-17: 01:00:00 via INTRAVENOUS

## 2015-01-17 MED ORDER — DIPHENHYDRAMINE HCL 50 MG/ML IJ SOLN
25.0000 mg | Freq: Once | INTRAMUSCULAR | Status: AC
  Administered 2015-01-17: 25 mg via INTRAVENOUS
  Filled 2015-01-17: qty 1

## 2015-01-17 MED ORDER — ONDANSETRON HCL 4 MG/2ML IJ SOLN: 4.0000 mg | Freq: Three times a day (TID) | INTRAMUSCULAR | Status: AC | PRN

## 2015-01-17 MED ORDER — MENTHOL 3 MG MT LOZG
1.0000 | LOZENGE | OROMUCOSAL | Status: DC | PRN
  Administered 2015-01-17 – 2015-01-18 (×2): 3 mg via ORAL
  Filled 2015-01-17 (×2): qty 9

## 2015-01-17 NOTE — Care Management Note (Signed)
Case Management Note  Patient Details  Name: Philip Gates MRN: [id number] Date of Birth: 1956/05/10  Subjective/Objective:                  Pt admitted from home with SBO. Pt lives with his wife and will return home at discharge. Pt is independent with ADL's.  Action/Plan: No CM needs anticipated.  Expected Discharge Date:                  Expected Discharge Plan:  Home/Self Care  In-House Referral:  NA  Discharge planning Services  CM Consult  Post Acute Care Choice:  NA Choice offered to:  NA  DME Arranged:    DME Agency:     HH Arranged:    HH Agency:     Status of Service:  Completed, signed off  Medicare Important Message Given:    Date Medicare IM Given:    Medicare IM give by:    Date Additional Medicare IM Given:    Additional Medicare Important Message give by:     If discussed at [place] of Stay Meetings, dates discussed:    Additional Comments:  Joylene Draft, RN 01/17/2015, 12:51 PM

## 2015-01-17 NOTE — Consult Note (Signed)
Reason for Consult: Small bowel obstruction Referring Physician: Hospitalist  Philip Gates is an 59 y.o. male.  HPI: Patient is a 59 year old white male who presented emergency room with worsening abdominal distention, nausea, vomiting, and abdominal pain. CT scan of the abdomen was performed which revealed a small bowel obstruction, though there was air and stool within the colon. Patient states that he has had intermittent episodes of this in the past. He has had multiple medical problems including a brain aneurysm and knee surgery. He has been on and off multiple antibiotics. He does deny any diarrhea. He has never had abdominal surgery. An NG tube was placed in the patient does feel significantly better, though he still has some bloating. Some abdominal pain is present, though decreased.  Past Medical History  Diagnosis Date  . Asthma     ACTIVITY INDUCED  . Sinus drainage   . Arthritis   . GERD (gastroesophageal reflux disease)   . Bipolar 1 disorder   . Neck pain, chronic   . Cellulitis 05/2014    right side face  . 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 BPH (benign prostatic hyperplasia)     Past Surgical History  Procedure Laterality Date  . Nose surgery    . Total hip arthroplasty Left 01/03/2013    Procedure: TOTAL HIP ARTHROPLASTY ANTERIOR APPROACH;  Surgeon: Gearlean Alf, MD;  Location: [facility];  Service: Orthopedics;  Laterality: Left;  . Dental implant  09/26/2014    left lower dental implant  . Total knee arthroplasty Right 10/06/2014    Procedure: RIGHT TOTAL KNEE ARTHROPLASTY;  Surgeon: Gearlean Alf, MD;  Location: [facility];  Service: Orthopedics;  Laterality: Right;  . Radiology with anesthesia N/A 12/29/2014    Procedure: Embolization;  Surgeon: Consuella Lose, MD;  Location: [facility];  Service: Radiology;  Laterality: N/A;  . Cerebral stent      Family History  Problem Relation Age of Onset  . Arrhythmia Mother     Has  pacemaker  . Colon cancer Father     Social History:  reports that he quit smoking about 7 months ago. He does not have any smokeless tobacco history on file. He reports that he does not drink alcohol or use illicit drugs.  Allergies:  Allergies  Allergen Reactions  . Aspirin Other (See Comments)    Do not take with depakote.   Marland Kitchen Penicillins Rash    Medications: I have reviewed the patient's current medications.  Results for orders placed or performed during the hospital encounter of 01/16/15 (from the past 48 hour(s))  CBC with Differential     Status: Abnormal   Collection Time: 01/16/15  8:35 PM  Result Value Ref Range   WBC 16.8 (H) 4.0 - 10.5 K/uL   RBC 5.70 4.22 - 5.81 MIL/uL   Hemoglobin 16.1 13.0 - 17.0 g/dL   HCT 47.8 39.0 - 52.0 %   MCV 83.9 78.0 - 100.0 fL   MCH 28.2 26.0 - 34.0 pg   MCHC 33.7 30.0 - 36.0 g/dL   RDW 14.3 11.5 - 15.5 %   Platelets 285 150 - 400 K/uL   Neutrophils Relative % 91 (H) 43 - 77 %   Neutro Abs 15.2 (H) 1.7 - 7.7 K/uL   Lymphocytes Relative 5 (L) 12 - 46 %   Lymphs Abs 0.9 0.7 - 4.0 K/uL   Monocytes Relative 4 3 - 12 %   Monocytes  Absolute 0.7 0.1 - 1.0 K/uL   Eosinophils Relative 0 0 - 5 %   Eosinophils Absolute 0.0 0.0 - 0.7 K/uL   Basophils Relative 0 0 - 1 %   Basophils Absolute 0.0 0.0 - 0.1 K/uL  Comprehensive metabolic panel     Status: Abnormal   Collection Time: 01/16/15  8:35 PM  Result Value Ref Range   Sodium 138 135 - 145 mmol/L   Potassium 3.7 3.5 - 5.1 mmol/L   Chloride 103 101 - 111 mmol/L   CO2 23 22 - 32 mmol/L   Glucose, Bld 119 (H) 70 - 99 mg/dL   BUN 13 6 - 20 mg/dL   Creatinine, Ser 1.15 0.61 - 1.24 mg/dL   Calcium 10.3 8.9 - 10.3 mg/dL   Total Protein 8.9 (H) 6.5 - 8.1 g/dL   Albumin 5.2 (H) 3.5 - 5.0 g/dL   AST 25 15 - 41 U/L   ALT 26 17 - 63 U/L   Alkaline Phosphatase 79 38 - 126 U/L   Total Bilirubin 0.5 0.3 - 1.2 mg/dL   GFR calc non Af Amer >60 >60 mL/min   GFR calc Af Amer >60 >60 mL/min     Comment: (NOTE) The eGFR has been calculated using the CKD EPI equation. This calculation has not been validated in all clinical situations. eGFR's persistently <60 mL/min signify possible Chronic Kidney Disease.    Anion gap 12 5 - 15  Lactic acid, plasma     Status: None   Collection Time: 01/16/15  8:35 PM  Result Value Ref Range   Lactic Acid, Venous 1.3 0.5 - 2.0 mmol/L  Urinalysis, Routine w reflex microscopic     Status: Abnormal   Collection Time: 01/16/15 10:22 PM  Result Value Ref Range   Color, Urine YELLOW YELLOW   APPearance CLEAR CLEAR   Specific Gravity, Urine 1.020 1.005 - 1.030   pH 5.0 5.0 - 8.0   Glucose, UA NEGATIVE NEGATIVE mg/dL   Hgb urine dipstick TRACE (A) NEGATIVE   Bilirubin Urine NEGATIVE NEGATIVE   Ketones, ur NEGATIVE NEGATIVE mg/dL   Protein, ur 30 (A) NEGATIVE mg/dL   Urobilinogen, UA 0.2 0.0 - 1.0 mg/dL   Nitrite NEGATIVE NEGATIVE   Leukocytes, UA NEGATIVE NEGATIVE  Urine microscopic-add on     Status: Abnormal   Collection Time: 01/16/15 10:22 PM  Result Value Ref Range   Squamous Epithelial / LPF FEW (A) RARE   WBC, UA 0-2 <3 WBC/hpf   RBC / HPF 0-2 <3 RBC/hpf   Bacteria, UA RARE RARE   Casts GRANULAR CAST (A) NEGATIVE  CBC     Status: None   Collection Time: 01/17/15  6:08 AM  Result Value Ref Range   WBC 6.6 4.0 - 10.5 K/uL   RBC 4.78 4.22 - 5.81 MIL/uL   Hemoglobin 13.2 13.0 - 17.0 g/dL   HCT 40.8 39.0 - 52.0 %   MCV 85.4 78.0 - 100.0 fL   MCH 27.6 26.0 - 34.0 pg   MCHC 32.4 30.0 - 36.0 g/dL   RDW 14.5 11.5 - 15.5 %   Platelets 259 150 - 400 K/uL  Comprehensive metabolic panel     Status: None   Collection Time: 01/17/15  6:08 AM  Result Value Ref Range   Sodium 141 135 - 145 mmol/L   Potassium 4.1 3.5 - 5.1 mmol/L   Chloride 105 101 - 111 mmol/L   CO2 30 22 - 32 mmol/L   Glucose, Bld 98  70 - 99 mg/dL   BUN 14 6 - 20 mg/dL   Creatinine, Ser 0.89 0.61 - 1.24 mg/dL   Calcium 8.9 8.9 - 10.3 mg/dL   Total Protein 6.5 6.5 - 8.1  g/dL   Albumin 3.8 3.5 - 5.0 g/dL   AST 18 15 - 41 U/L   ALT 18 17 - 63 U/L   Alkaline Phosphatase 54 38 - 126 U/L   Total Bilirubin 0.5 0.3 - 1.2 mg/dL   GFR calc non Af Amer >60 >60 mL/min   GFR calc Af Amer >60 >60 mL/min    Comment: (NOTE) The eGFR has been calculated using the CKD EPI equation. This calculation has not been validated in all clinical situations. eGFR's persistently <60 mL/min signify possible Chronic Kidney Disease.    Anion gap 6 5 - 15    Ct Abdomen Pelvis W Contrast  01/16/2015   CLINICAL DATA:  Abdominal pain. Atraumatic acute onset abdominal pain. LEFT lower abdominal pain with nausea and vomiting.  EXAM: CT ABDOMEN AND PELVIS WITH CONTRAST  TECHNIQUE: Multidetector CT imaging of the abdomen and pelvis was performed using the standard protocol following bolus administration of intravenous contrast.  CONTRAST:  11mL OMNIPAQUE IOHEXOL 300 MG/ML SOLN, 121mL OMNIPAQUE IOHEXOL 300 MG/ML SOLN  COMPARISON:  None.  FINDINGS: Musculoskeletal: No aggressive osseous lesions. Mild dextroconvex thoracolumbar scoliosis. LEFT total hip arthroplasty.  Lung Bases: Atelectasis.  Liver:  Normal.  Spleen:  Normal.  Gallbladder:  Normal.  Common bile duct:  Normal.  Pancreas:  Normal.  Adrenal glands:  Normal bilaterally.  Kidneys: Normal enhancement and delayed excretion bilaterally. LEFT ureter appears normal.RIGHT ureter appears normal.  Stomach:  Distended with oral contrast.  Small bowel: Progressive dilation of small bowel extending into the anatomic pelvis. Distal small bowel is decompressed. Maximal diameter small bowel loops is 35 mm. There is angulation of several decompressed loops of small bowel that suggests either internal hernia or adhesive small bowel obstruction.  Colon: Fluid levels in the proximal colon. Moderate stool burden extending to the rectosigmoid.  Pelvic Genitourinary: Within normal limits allowing for artifact from hip arthroplasty.  Peritoneum: No free air.  No  free fluid.  Vascular/lymphatic: Normal.  Body Wall: Normal.  IMPRESSION: High-grade small bowel obstruction. Loops of decompressed ileum are present with angulation suggesting either internal hernia or adhesive small bowel obstruction.   Electronically Signed   By: Dereck Ligas M.D.   On: 01/16/2015 21:56   Dg Abd 2 Views  01/17/2015   CLINICAL DATA:  Followup small bowel obstruction.  Abdominal pain.  EXAM: ABDOMEN - 2 VIEW  COMPARISON:  CT, 01/16/2015  FINDINGS: There is no small bowel dilation on the current radiograph to suggest residual small bowel obstruction. Residual contrast is noted in a normal caliber colon. There is residual intravenous contrast in the bladder.  Soft tissues are unremarkable. No evidence renal or ureteral stones.  NG tube well positioned with its tip in the distal stomach.  There are degenerative changes of the visualized spine. Left hip prosthesis is incompletely imaged but appears well seated and aligned.  IMPRESSION: 1. Resolved small bowel obstruction.  No new abnormalities.   Electronically Signed   By: Lajean Manes M.D.   On: 01/17/2015 09:59    ROS: See chart Blood pressure 131/73, pulse 100, temperature 98.2 F (36.8 C), temperature source Oral, resp. rate 18, height $RemoveBe'5\' 11"'ufhJdzuVS$  (1.803 m), weight 92.987 kg (205 lb), SpO2 95 %. Physical Exam: Pleasant white male in no acute  distress. His abdomen is slightly distended but soft. Minimal bowel sounds appreciated. No rigidity noted. No hepatosplenomegaly, masses, or hernias identified. Rectal was deferred at this time.  Assessment/Plan: Impression: Small bowel obstruction versus ileus. Clinically improved. Plan: No need for acute surgical intervention at this time. Will monitor the NG tube output. We'll follow with you.  Onna Nodal A 01/17/2015, 11:11 AM

## 2015-01-17 NOTE — Progress Notes (Signed)
UR chart review completed.  

## 2015-01-17 NOTE — Progress Notes (Signed)
[facility] PROGRESS NOTE  Philip Gates QPR:[id number] DOB: 01-30-1956 DOA: 01/16/2015 PCP: Delphina Cahill, MD  Assessment/Plan: 1. Small bowel obstruction. -Patient presented initially with complaints of abdominal pain that was associate with nausea and vomiting. CT scan of abdomen and pelvis revealed findings consistent with high-grade small bowel obstruction. He denies having history of abdominal surgeries. -He seems improved this morning from a clinical standpoint with improvement to abdominal pain and distention. NG tube remains in place. Denies bowel movements however reports passing flatus. -Doubt he'll require surgery with improvement on conservative management, telemetry for recommendations from general surgery -Continue providing supportive care, IV fluids, nothing by mouth status.   2.  Leukocytosis. -Initial lab work revealed a white count of 16,800, which has trended down to 6600.  I believe white count secondary to small bowel obstruction  Code Status: Full code Family Communication: Family not present Disposition Plan:  Anticipate discharge home when stable   Consultants:  General surgery  Procedures:  NG tube placement  HPI/Subjective: Patient is a 59 year old gentleman with a past medical history and has been internal carotid artery aneurysm, gastroesophageal reflux disease, admitted to the medicine service on 01/16/2015 when he presented to the emergency room with complaints of abdominal pain associated with nausea and vomiting. To that symptoms started earlier in the day that were of sudden onset. He reported his last bowel movement was yesterday morning. He denies history of abdominal surgeries. He was worked up with a CT scan of abdomen and pelvis that showed a high-grade small bowel obstruction. He was made nothing by mouth, NG tube placed, IV fluids administered. General surgery was consulted.  Objective: Filed Vitals:   01/17/15 0533  BP: 131/73  Pulse:  100  Temp: 98.2 F (36.8 C)  Resp: 18    Intake/Output Summary (Last 24 hours) at 01/17/15 1038 Last data filed at 01/17/15 0830  Gross per 24 hour  Intake 686.25 ml  Output   1175 ml  Net -488.75 ml   Filed Weights   01/16/15 1959  Weight: 92.987 kg (205 lb)    Exam:   General:  Patient is awake and alert, reports feeling better.  Cardiovascular: Regular rate and rhythm normal S1-S2 no murmurs rubs or gallops  Respiratory: Normal respiratory effort, lungs are clear to auscultation bilaterally  Abdomen: His abdomen is nondistended, overall soft, mild tenderness to palpation across abdomen, NG tube in place  Musculoskeletal: No edema  Data Reviewed: Basic Metabolic Panel:  Recent Labs Lab 01/16/15 2035 01/17/15 0608  NA 138 141  K 3.7 4.1  CL 103 105  CO2 23 30  GLUCOSE 119* 98  BUN 13 14  CREATININE 1.15 0.89  CALCIUM 10.3 8.9   Liver Function Tests:  Recent Labs Lab 01/16/15 2035 01/17/15 0608  AST 25 18  ALT 26 18  ALKPHOS 79 54  BILITOT 0.5 0.5  PROT 8.9* 6.5  ALBUMIN 5.2* 3.8   No results for input(s): LIPASE, AMYLASE in the last 168 hours. No results for input(s): AMMONIA in the last 168 hours. CBC:  Recent Labs Lab 01/16/15 2035 01/17/15 0608  WBC 16.8* 6.6  NEUTROABS 15.2*  --   HGB 16.1 13.2  HCT 47.8 40.8  MCV 83.9 85.4  PLT 285 259   Cardiac Enzymes: No results for input(s): CKTOTAL, CKMB, CKMBINDEX, TROPONINI in the last 168 hours. BNP (last 3 results) No results for input(s): BNP in the last 8760 hours.  ProBNP (last 3 results) No results for input(s): PROBNP in  the last 8760 hours.  CBG: No results for input(s): GLUCAP in the last 168 hours.  No results found for this or any previous visit (from the past 240 hour(s)).   Studies: Ct Abdomen Pelvis W Contrast  01/16/2015   CLINICAL DATA:  Abdominal pain. Atraumatic acute onset abdominal pain. LEFT lower abdominal pain with nausea and vomiting.  EXAM: CT ABDOMEN AND  PELVIS WITH CONTRAST  TECHNIQUE: Multidetector CT imaging of the abdomen and pelvis was performed using the standard protocol following bolus administration of intravenous contrast.  CONTRAST:  71mL OMNIPAQUE IOHEXOL 300 MG/ML SOLN, 158mL OMNIPAQUE IOHEXOL 300 MG/ML SOLN  COMPARISON:  None.  FINDINGS: Musculoskeletal: No aggressive osseous lesions. Mild dextroconvex thoracolumbar scoliosis. LEFT total hip arthroplasty.  Lung Bases: Atelectasis.  Liver:  Normal.  Spleen:  Normal.  Gallbladder:  Normal.  Common bile duct:  Normal.  Pancreas:  Normal.  Adrenal glands:  Normal bilaterally.  Kidneys: Normal enhancement and delayed excretion bilaterally. LEFT ureter appears normal.RIGHT ureter appears normal.  Stomach:  Distended with oral contrast.  Small bowel: Progressive dilation of small bowel extending into the anatomic pelvis. Distal small bowel is decompressed. Maximal diameter small bowel loops is 35 mm. There is angulation of several decompressed loops of small bowel that suggests either internal hernia or adhesive small bowel obstruction.  Colon: Fluid levels in the proximal colon. Moderate stool burden extending to the rectosigmoid.  Pelvic Genitourinary: Within normal limits allowing for artifact from hip arthroplasty.  Peritoneum: No free air.  No free fluid.  Vascular/lymphatic: Normal.  Body Wall: Normal.  IMPRESSION: High-grade small bowel obstruction. Loops of decompressed ileum are present with angulation suggesting either internal hernia or adhesive small bowel obstruction.   Electronically Signed   By: Dereck Ligas M.D.   On: 01/16/2015 21:56   Dg Abd 2 Views  01/17/2015   CLINICAL DATA:  Followup small bowel obstruction.  Abdominal pain.  EXAM: ABDOMEN - 2 VIEW  COMPARISON:  CT, 01/16/2015  FINDINGS: There is no small bowel dilation on the current radiograph to suggest residual small bowel obstruction. Residual contrast is noted in a normal caliber colon. There is residual intravenous contrast  in the bladder.  Soft tissues are unremarkable. No evidence renal or ureteral stones.  NG tube well positioned with its tip in the distal stomach.  There are degenerative changes of the visualized spine. Left hip prosthesis is incompletely imaged but appears well seated and aligned.  IMPRESSION: 1. Resolved small bowel obstruction.  No new abnormalities.   Electronically Signed   By: Lajean Manes M.D.   On: 01/17/2015 09:59    Scheduled Meds: . enoxaparin (LOVENOX) injection  40 mg Subcutaneous Q24H  . pantoprazole (PROTONIX) IV  40 mg Intravenous Q24H   Continuous Infusions: . sodium chloride 100 mL/hr at 01/17/15 0110  . sodium chloride 75 mL/hr at 01/17/15 0111    Active Problems:   Bipolar 1 disorder   Cerebral aneurysm   SBO (small bowel obstruction)    Time spent: 25 minutes    Kelvin Cellar  [facility] Pager [phone]. If 7PM-7AM, please contact night-coverage at [email or web address], password [facility] 01/17/2015, 10:38 AM  LOS: 1 day

## 2015-01-18 LAB — BASIC METABOLIC PANEL
Anion gap: 7 (ref 5–15)
BUN: 13 mg/dL (ref 6–20)
CALCIUM: 8.8 mg/dL — AB (ref 8.9–10.3)
CO2: 27 mmol/L (ref 22–32)
CREATININE: 0.83 mg/dL (ref 0.61–1.24)
Chloride: 106 mmol/L (ref 101–111)
Glucose, Bld: 88 mg/dL (ref 65–99)
Potassium: 3.7 mmol/L (ref 3.5–5.1)
SODIUM: 140 mmol/L (ref 135–145)

## 2015-01-18 LAB — CBC
HEMATOCRIT: 40.1 % (ref 39.0–52.0)
Hemoglobin: 12.8 g/dL — ABNORMAL LOW (ref 13.0–17.0)
MCH: 27.2 pg (ref 26.0–34.0)
MCHC: 31.9 g/dL (ref 30.0–36.0)
MCV: 85.1 fL (ref 78.0–100.0)
Platelets: 239 10*3/uL (ref 150–400)
RBC: 4.71 MIL/uL (ref 4.22–5.81)
RDW: 14.2 % (ref 11.5–15.5)
WBC: 5.3 10*3/uL (ref 4.0–10.5)

## 2015-01-18 MED ORDER — ONDANSETRON HCL 4 MG PO TABS: 4.0000 mg | ORAL_TABLET | Freq: Four times a day (QID) | ORAL | Status: DC | PRN

## 2015-01-18 NOTE — Progress Notes (Signed)
Subjective: Has had flatus over past 24 hours. Feels like he could have a bowel movement.  Objective: Vital signs in last 24 hours: Temp:  [98 F (36.7 C)-98.1 F (36.7 C)] 98.1 F (36.7 C) (05/12 0646) Pulse Rate:  [76-83] 83 (05/12 0646) Resp:  [18] 18 (05/12 0646) BP: (126-147)/(90-92) 147/90 mmHg (05/12 0646) SpO2:  [95 %-97 %] 97 % (05/12 0646) Last BM Date: 01/16/15  Intake/Output from previous day: 05/11 0701 - 05/12 0700 In: 2256.7 [I.V.:2256.7] Out: 1650 [Urine:900; Emesis/NG output:750] Intake/Output this shift:    General appearance: alert, cooperative and no distress GI: soft, non-tender; bowel sounds normal; no masses,  no organomegaly  Lab Results:   Recent Labs  01/17/15 0608 01/18/15 0743  WBC 6.6 5.3  HGB 13.2 12.8*  HCT 40.8 40.1  PLT 259 239   BMET  Recent Labs  01/17/15 0608 01/18/15 0743  NA 141 140  K 4.1 3.7  CL 105 106  CO2 30 27  GLUCOSE 98 88  BUN 14 13  CREATININE 0.89 0.83  CALCIUM 8.9 8.8*   PT/INR No results for input(s): LABPROT, INR in the last 72 hours.  Studies/Results: Ct Abdomen Pelvis W Contrast  01/16/2015   CLINICAL DATA:  Abdominal pain. Atraumatic acute onset abdominal pain. LEFT lower abdominal pain with nausea and vomiting.  EXAM: CT ABDOMEN AND PELVIS WITH CONTRAST  TECHNIQUE: Multidetector CT imaging of the abdomen and pelvis was performed using the standard protocol following bolus administration of intravenous contrast.  CONTRAST:  1mL OMNIPAQUE IOHEXOL 300 MG/ML SOLN, 155mL OMNIPAQUE IOHEXOL 300 MG/ML SOLN  COMPARISON:  None.  FINDINGS: Musculoskeletal: No aggressive osseous lesions. Mild dextroconvex thoracolumbar scoliosis. LEFT total hip arthroplasty.  Lung Bases: Atelectasis.  Liver:  Normal.  Spleen:  Normal.  Gallbladder:  Normal.  Common bile duct:  Normal.  Pancreas:  Normal.  Adrenal glands:  Normal bilaterally.  Kidneys: Normal enhancement and delayed excretion bilaterally. LEFT ureter appears  normal.RIGHT ureter appears normal.  Stomach:  Distended with oral contrast.  Small bowel: Progressive dilation of small bowel extending into the anatomic pelvis. Distal small bowel is decompressed. Maximal diameter small bowel loops is 35 mm. There is angulation of several decompressed loops of small bowel that suggests either internal hernia or adhesive small bowel obstruction.  Colon: Fluid levels in the proximal colon. Moderate stool burden extending to the rectosigmoid.  Pelvic Genitourinary: Within normal limits allowing for artifact from hip arthroplasty.  Peritoneum: No free air.  No free fluid.  Vascular/lymphatic: Normal.  Body Wall: Normal.  IMPRESSION: High-grade small bowel obstruction. Loops of decompressed ileum are present with angulation suggesting either internal hernia or adhesive small bowel obstruction.   Electronically Signed   By: Dereck Ligas M.D.   On: 01/16/2015 21:56   Dg Abd 2 Views  01/17/2015   CLINICAL DATA:  Followup small bowel obstruction.  Abdominal pain.  EXAM: ABDOMEN - 2 VIEW  COMPARISON:  CT, 01/16/2015  FINDINGS: There is no small bowel dilation on the current radiograph to suggest residual small bowel obstruction. Residual contrast is noted in a normal caliber colon. There is residual intravenous contrast in the bladder.  Soft tissues are unremarkable. No evidence renal or ureteral stones.  NG tube well positioned with its tip in the distal stomach.  There are degenerative changes of the visualized spine. Left hip prosthesis is incompletely imaged but appears well seated and aligned.  IMPRESSION: 1. Resolved small bowel obstruction.  No new abnormalities.   Electronically Signed  By: Lajean Manes M.D.   On: 01/17/2015 09:59    Anti-infectives: Anti-infectives    None      Assessment/Plan: Impression: Small bowel obstruction, clinically resolving. Plan: Have removed NG tube and will advance to clear liquid diet. May advance diet once patient has bowel  movements.  LOS: 2 days    Philip Gates A 01/18/2015

## 2015-01-18 NOTE — Progress Notes (Signed)
Pt discharged home today per Dr. Coralyn Pear.  Pt's IV site D/C'd and WDL.  Pt's VSS. Pt provided with home medication list, discharge instructions and prescriptions.  Verbalized understanding.  Pt left floor via WC in stable condition accompanied by NT and family.

## 2015-01-18 NOTE — Care Management Note (Signed)
Case Management Note  Patient Details  Name: GARREN GREENMAN MRN: [id number] Date of Birth: [birth year]  Subjective/Objective:                    Action/Plan:   Expected Discharge Date:                  Expected Discharge Plan:  Home/Self Care  In-House Referral:  NA  Discharge planning Services  CM Consult  Post Acute Care Choice:  NA Choice offered to:  NA  DME Arranged:    DME Agency:     HH Arranged:    HH Agency:     Status of Service:  Completed, signed off  Medicare Important Message Given:  No Date Medicare IM Given:    Medicare IM give by:    Date Additional Medicare IM Given:    Additional Medicare Important Message give by:     If discussed at [place] of Stay Meetings, dates discussed:    Additional Comments: Pt discharged home today. No CM needs noted. Christinia Gully [place], RN 01/18/2015, 4:15 PM

## 2015-01-18 NOTE — Discharge Summary (Signed)
Physician Discharge Summary  Philip Gates VFI:[id number] DOB: 1956-03-30 DOA: 01/16/2015  PCP: Delphina Cahill, MD  Admit date: 01/16/2015 Discharge date: 01/18/2015  Time spent: 35 minutes  Recommendations for Outpatient Follow-up:  1. Patient expressed wishes to be discharged this afternoon, stating tolerating clears and having bowel movements. We recommended that he stay overnight to ensure he would tolerated a regular diet, however, he was adamant about leaving this afternoon.    Discharge Diagnoses:  Principal Problem:   SBO (small bowel obstruction) Active Problems:   Bipolar 1 disorder   Cerebral aneurysm   Discharge Condition: Stable  Diet recommendation: Patient instructed to gradually advance his diet  Filed Weights   01/16/15 1959  Weight: 92.987 kg (205 lb)    History of present illness:  59 year old male who  has a past medical history of Asthma; Sinus drainage; Arthritis; GERD (gastroesophageal reflux disease); Bipolar 1 disorder; Neck pain, chronic; Cellulitis (05/2014); Aneurysm; and BPH (benign prostatic hyperplasia). Today presents to the hospital with sharp sudden onset left lower abdominal pain associated with nausea and vomiting. 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 Patient was prescribed clindamycin 1 tablet 3 times a day for 10 days. And patient has been taking the antibiotic for past 10 days. Denies any fever, no chest pain or shortness of breath. No dysuria urgency frequency of urination. In the ED, CT scan abdomen pelvis was done which showed high-grade small bowel obstruction, loops of decompressed ileum present with angulation suggesting either internal hernia order this if small bowel obstruction.  Hospital Course:  Patient is a 60 year old gentleman with a past medical history and has been internal carotid artery aneurysm, gastroesophageal reflux disease,  admitted to the medicine service on 01/16/2015 when he presented to the emergency room with complaints of abdominal pain associated with nausea and vomiting. To that symptoms started earlier in the day that were of sudden onset. He reported his last bowel movement was yesterday morning. He denies history of abdominal surgeries. He was worked up with a CT scan of abdomen and pelvis that showed a high-grade small bowel obstruction. He was made nothing by mouth, NG tube placed, IV fluids administered. General surgery was consulted. He showed significant improvement with conservative management. By 01/18/2015 he reported resolution to his abdominal pain and flatus. His NG tube was removed and started on clears. He had bowel movement. We had recommended that he stay to see how he did with advancement of his diet to regular however he was adamant about leaving the hospital this afternoon. He was instructed to slowly advance his diet at home. He was given a follow up appointment with Dr Arnoldo Morale of general surgery.   Consultations:  General Surgery  Discharge Exam: Filed Vitals:   01/18/15 1610  BP: 157/98  Pulse: 82  Temp: 98.1 F (36.7 C)  Resp: 18     General: Patient is awake and alert, reports feeling better.  Cardiovascular: Regular rate and rhythm normal S1-S2 no murmurs rubs or gallops  Respiratory: Normal respiratory effort, lungs are clear to auscultation bilaterally  Abdomen: His abdomen is nondistended, soft, having positive bowel movements.   Musculoskeletal: No edema  Discharge Instructions   Discharge Instructions    Call MD for:  difficulty breathing, headache or visual disturbances    Complete by:  As directed      Call MD for:  extreme fatigue    Complete by:  As directed  Call MD for:  hives    Complete by:  As directed      Call MD for:  persistant dizziness or light-headedness    Complete by:  As directed      Call MD for:  persistant nausea and vomiting     Complete by:  As directed      Call MD for:  redness, tenderness, or signs of infection (pain, swelling, redness, odor or green/yellow discharge around incision site)    Complete by:  As directed      Call MD for:  severe uncontrolled pain    Complete by:  As directed      Call MD for:  temperature >100.4    Complete by:  As directed      Diet - low sodium heart healthy    Complete by:  As directed      Diet - low sodium heart healthy    Complete by:  As directed      Increase activity slowly    Complete by:  As directed      Increase activity slowly    Complete by:  As directed           Current Discharge Medication List    START taking these medications   Details  ondansetron (ZOFRAN) 4 MG tablet Take 1 tablet (4 mg total) by mouth every 6 (six) hours as needed for nausea. Qty: 20 tablet, Refills: 0      CONTINUE these medications which have NOT CHANGED   Details  aspirin 325 MG tablet Take 325 mg by mouth once.    clopidogrel (PLAVIX) 75 MG tablet Take 75 mg by mouth daily.    dexlansoprazole (DEXILANT) 60 MG capsule Take 60 mg by mouth every morning.     divalproex (DEPAKOTE ER) 500 MG 24 hr tablet Take 500 mg by mouth at bedtime.     Dutasteride-Tamsulosin HCl (JALYN) 0.5-0.4 MG CAPS Take 1 capsule by mouth daily at 12 noon.     HYDROcodone-acetaminophen (NORCO) 7.5-325 MG per tablet Take 1-2 tablets by mouth every 6 (six) hours as needed for moderate pain.    ipratropium (ATROVENT) 0.03 % nasal spray Place 2 sprays into both nostrils every 12 (twelve) hours as needed for rhinitis.    lamoTRIgine (LAMICTAL) 200 MG tablet Take 200 mg by mouth at bedtime.    levalbuterol (XOPENEX HFA) 45 MCG/ACT inhaler Inhale 1-2 puffs into the lungs every 6 (six) hours as needed for wheezing.     lisdexamfetamine (VYVANSE) 70 MG capsule Take 70 mg by mouth every morning.     montelukast (SINGULAIR) 10 MG tablet Take 10 mg by mouth every morning.     RABEprazole (ACIPHEX) 20 MG  tablet Take 20 mg by mouth every evening.     tadalafil (CIALIS) 20 MG tablet Take 20 mg by mouth daily as needed for erectile dysfunction.    testosterone cypionate (DEPOTESTOTERONE CYPIONATE) 200 MG/ML injection Inject 200 mg into the muscle every 14 (fourteen) days.     methocarbamol (ROBAXIN) 500 MG tablet Take 1 tablet (500 mg total) by mouth every 6 (six) hours as needed for muscle spasms. Qty: 80 tablet, Refills: 1    traMADol (ULTRAM) 50 MG tablet Take 1-2 tablets (50-100 mg total) by mouth every 6 (six) hours as needed for moderate pain. Qty: 90 tablet, Refills: 1      STOP taking these medications     aspirin-sod bicarb-citric acid (ALKA-SELTZER) 325 MG TBEF tablet  chlorhexidine (PERIDEX) 0.12 % solution      clindamycin (CLEOCIN) 300 MG capsule      promethazine (PHENERGAN) 25 MG tablet      loperamide (IMODIUM) 2 MG capsule      Oxycodone HCl 10 MG TABS        Allergies  Allergen Reactions  . Aspirin Other (See Comments)    Do not take with depakote.   Marland Kitchen Penicillins Rash   Follow-up Information    Follow up with Jamesetta So, MD In 1 week.   Specialty:  General Surgery   Contact information:   [address] [place] [address] [phone]       Follow up with Delphina Cahill, MD In 2 weeks.   Specialty:  Internal Medicine   Contact information:    [place] [address] [phone]        The results of significant diagnostics from this hospitalization (including imaging, microbiology, ancillary and laboratory) are listed below for reference.    Significant Diagnostic Studies: Ct Abdomen Pelvis W Contrast  01/16/2015   CLINICAL DATA:  Abdominal pain. Atraumatic acute onset abdominal pain. LEFT lower abdominal pain with nausea and vomiting.  EXAM: CT ABDOMEN AND PELVIS WITH CONTRAST  TECHNIQUE: Multidetector CT imaging of the abdomen and pelvis was performed using the standard protocol following bolus administration of  intravenous contrast.  CONTRAST:  68mL OMNIPAQUE IOHEXOL 300 MG/ML SOLN, 162mL OMNIPAQUE IOHEXOL 300 MG/ML SOLN  COMPARISON:  None.  FINDINGS: Musculoskeletal: No aggressive osseous lesions. Mild dextroconvex thoracolumbar scoliosis. LEFT total hip arthroplasty.  Lung Bases: Atelectasis.  Liver:  Normal.  Spleen:  Normal.  Gallbladder:  Normal.  Common bile duct:  Normal.  Pancreas:  Normal.  Adrenal glands:  Normal bilaterally.  Kidneys: Normal enhancement and delayed excretion bilaterally. LEFT ureter appears normal.RIGHT ureter appears normal.  Stomach:  Distended with oral contrast.  Small bowel: Progressive dilation of small bowel extending into the anatomic pelvis. Distal small bowel is decompressed. Maximal diameter small bowel loops is 35 mm. There is angulation of several decompressed loops of small bowel that suggests either internal hernia or adhesive small bowel obstruction.  Colon: Fluid levels in the proximal colon. Moderate stool burden extending to the rectosigmoid.  Pelvic Genitourinary: Within normal limits allowing for artifact from hip arthroplasty.  Peritoneum: No free air.  No free fluid.  Vascular/lymphatic: Normal.  Body Wall: Normal.  IMPRESSION: High-grade small bowel obstruction. Loops of decompressed ileum are present with angulation suggesting either internal hernia or adhesive small bowel obstruction.   Electronically Signed   By: Dereck Ligas M.D.   On: 01/16/2015 21:56   Ir Transcath/emboliz  01/15/2015   PROCEDURE: PIPELINE EMBOLIZATION OF RIGHT INTERNAL CAROTID ARTERY ANEURYSM  OPERATOR:  Dr. Consuella Lose, MD  HISTORY:  The patient is a 59 y.o. 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 He now presents for elective pipeline embolization.  APPROACH:  The technical aspects of the procedure as well as its potential risks and  benefits were reviewed with the patient and family . These risks included but were not limited bleeding, infection, allergic reaction, damage to organs/vital structures, stroke, non-diagnostic procedure, and the catastrophic outcomes of heart attack, coma, and death. With an understanding of these risks, informed consent was obtained and witnessed.  The patient was placed in the supine position on the angiography table and  the skin of right groin prepped in the usual sterile fashion. The procedure was performed under general anesthesia.  An 8- [place] sheath was introduced in the right common femoral artery using Seldinger technique. A fluorophase sequence was used to document the sheath position.  HEPARIN: 5000 Units total.  CONTRAST AGENT: 60cc, Omnipaque 300  FLUOROSCOPY TIME: 28.6 combined AP and lateral minutes  CATHETER(S) AND WIRE(S):  6-French Penumbra Select vert catheter  088 NeuronMax guide sheath  115cm Catalyst 5 catheter  150cm Via 27 microcatheter  Synchro2 STD microwire  VESSELS CATHETERIZED:  Right internal carotid artery  Right middle cerebral artery  VESSELS STUDIED:  Right internal carotid artery, head: AP, lateral (pre-embolization)  Right middle cerebral artery, head: AP, lateral  Right internal carotid artery, head: AP, lateral (immediate post embolization)  Right internal carotid artery, head: AP, lateral (5 minutes postembolization)  Right internal carotid artery, head: AP, lateral (10 minutes postembolization)  Right internal carotid artery, head: AP, lateral (final control)  PROCEDURAL NARRATIVE:  Under real-time fluoroscopy, the select catheter was advanced into the right common carotid artery over an 0.035" guidewire. The guide sheath was then advanced into the common carotid artery. After roadmap was taken, the guide sheath was advanced into the proximal internal carotid artery over the support of the select catheter and guidewire. The Shuttle Select catheter was removed and a 058  catalyst guidecatheter was coaxially introduced and advanced over the 027 microcatheter and microwire.  Under roadmap guidance, the microcatheter was advanced over a Synchro 2 standard microwire into the internal carotid artery and then into the MCA. The guide catheter was then tracked over the  Microcatheter to its final position in the distal petrous/proximal cavernous ICA. The Synchro 2 standard microwire was removed and Pipeline stent system was introduced after microcatheter run was taken.  Under real time fluoroscopy, the distal Pipeline device was opened in the MCA and withdrawn back into the supraclinoid ICA. Once satisfactory position of the Pipeline device was obtained, deployment was performed across the aneurysm neck under real-time fluoroscopy. Several single shots were performed during stent deployment to document its stability and appropriate deployment. Once deployment was complete, the microcatheter was then advanced through the Pipeline device and the pusher wire was recaptured and removed without incident. Cerebral angiography was performed immediately post deployment and at 5- and 10- minutes post deployment to document patency of the parent vessel and adequate positioning of the Pipeline. The microcatheter was subsequently removed without incident. Final control angiography was then performed from the guide catheter.  The guide catheter and shuttle sheath were then removed synchronously.  INTERPRETATION:  Right femoral artery:  The right femoral artery is unremarkable, arterial sheath in adequate position.  Right internal carotid artery, head (pre-embolization):  Injection reveals the presence of a widely patent ICA that leads to a patent ACA and MCA. The previously described paraophthalmic ICA aneurysm is again visualized. The visualized portions of capillary and venous phases are unremarkable.  Right internal carotid artery, head (immediate post-PED deployment):  Injection reveals the presence  of a widely patent ICA that leads to a patent ACA and MCA. The pipeline device is positioned across the neck of the aneurysm, and extends from the cavernous to supraclinoid ICA. The pipeline device is widely open with good vessel wall apposition in its proximal and distal ends. There is no thrombus visualized within the pipeline device.  Right internal carotid artery, head (5-min post-deployment):  The ICA, ACA, and MCA are widely patent. The device position  is stable, with good aneurysm coverage. The device is widely open with good vessel wall apposition in its proximal and distal ends. Significant contrast stasis is still seen. There is no thrombus visualized within the device.  Right internal carotid artery, head (10-min post-deployment):  The ICA, ACA, and MCA are widely patent. The device position is stable, with good aneurysm coverage. The device is widely open with good vessel wall apposition in its proximal and distal ends. Significant contrast stasis is still seen. There is no thrombus visualized within the device.  Right internal carotid artery, head (final control):  Injection reveals the presence of a widely patent ICA that leads to a patent ACA and MCA. The pipeline device is widely patent with good vessel wall apposition. No thrombus visualized with the pipeline device. Significant contrast stasis is seen within the aneurysm. The parenchymal and venous phases are unremarkable.  DISPOSITION:  After completion of the procedure, the femoral sheath was removed and hemostasis secured by application of a closure device. The procedure was well tolerated and no early complications were observed.  The patient was extubated in the angiography suite. Upon extubation, patient was following commands and moving all extremities well. The patient was transferred to the PACU for further recovery.  IMPRESSION:  1. Successful Pipeline embolization of a paraophthalmic right internal carotid artery aneurysm without local  thrombotic or distal embolic complication.  The patient's family was informed of the preliminary results of the procedure.   Electronically Signed   By: Consuella Lose   On: 01/15/2015 10:32   Ir Angiogram Follow Up Study  01/15/2015   PROCEDURE: PIPELINE EMBOLIZATION OF RIGHT INTERNAL CAROTID ARTERY ANEURYSM  OPERATOR:  Dr. Consuella Lose, MD  HISTORY:  The patient is a 59 y.o. 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 He now presents for elective pipeline embolization.  APPROACH:  The technical aspects of the procedure as well as its potential risks and benefits were reviewed with the patient and family . These risks included but were not limited bleeding, infection, allergic reaction, damage to organs/vital structures, stroke, non-diagnostic procedure, and the catastrophic outcomes of heart attack, coma, and death. With an understanding of these risks, informed consent was obtained and witnessed.  The patient was placed in the supine position on the angiography table and the skin of right groin prepped in the usual sterile fashion. The procedure was performed under general anesthesia.  An 8- [place] sheath was introduced in the right common femoral artery using Seldinger technique. A fluorophase sequence was used to document the sheath position.  HEPARIN: 5000 Units total.  CONTRAST AGENT: 60cc, Omnipaque 300  FLUOROSCOPY TIME: 28.6 combined AP and lateral minutes  CATHETER(S) AND WIRE(S):  6-French Penumbra Select vert catheter  088 NeuronMax guide sheath  115cm Catalyst 5 catheter  150cm Via 27 microcatheter  Synchro2 STD microwire  VESSELS CATHETERIZED:  Right internal carotid artery  Right middle cerebral artery  VESSELS STUDIED:  Right internal carotid artery, head: AP, lateral (pre-embolization)  Right middle cerebral artery, head: AP, lateral  Right  internal carotid artery, head: AP, lateral (immediate post embolization)  Right internal carotid artery, head: AP, lateral (5 minutes postembolization)  Right internal carotid artery, head: AP, lateral (10 minutes postembolization)  Right internal carotid artery, head: AP, lateral (final control)  PROCEDURAL NARRATIVE:  Under real-time fluoroscopy, the select catheter was advanced into the right common  carotid artery over an 0.035" guidewire. The guide sheath was then advanced into the common carotid artery. After roadmap was taken, the guide sheath was advanced into the proximal internal carotid artery over the support of the select catheter and guidewire. The Shuttle Select catheter was removed and a 058 catalyst guidecatheter was coaxially introduced and advanced over the 027 microcatheter and microwire.  Under roadmap guidance, the microcatheter was advanced over a Synchro 2 standard microwire into the internal carotid artery and then into the MCA. The guide catheter was then tracked over the  Microcatheter to its final position in the distal petrous/proximal cavernous ICA. The Synchro 2 standard microwire was removed and Pipeline stent system was introduced after microcatheter run was taken.  Under real time fluoroscopy, the distal Pipeline device was opened in the MCA and withdrawn back into the supraclinoid ICA. Once satisfactory position of the Pipeline device was obtained, deployment was performed across the aneurysm neck under real-time fluoroscopy. Several single shots were performed during stent deployment to document its stability and appropriate deployment. Once deployment was complete, the microcatheter was then advanced through the Pipeline device and the pusher wire was recaptured and removed without incident. Cerebral angiography was performed immediately post deployment and at 5- and 10- minutes post deployment to document patency of the parent vessel and adequate positioning of the Pipeline. The  microcatheter was subsequently removed without incident. Final control angiography was then performed from the guide catheter.  The guide catheter and shuttle sheath were then removed synchronously.  INTERPRETATION:  Right femoral artery:  The right femoral artery is unremarkable, arterial sheath in adequate position.  Right internal carotid artery, head (pre-embolization):  Injection reveals the presence of a widely patent ICA that leads to a patent ACA and MCA. The previously described paraophthalmic ICA aneurysm is again visualized. The visualized portions of capillary and venous phases are unremarkable.  Right internal carotid artery, head (immediate post-PED deployment):  Injection reveals the presence of a widely patent ICA that leads to a patent ACA and MCA. The pipeline device is positioned across the neck of the aneurysm, and extends from the cavernous to supraclinoid ICA. The pipeline device is widely open with good vessel wall apposition in its proximal and distal ends. There is no thrombus visualized within the pipeline device.  Right internal carotid artery, head (5-min post-deployment):  The ICA, ACA, and MCA are widely patent. The device position is stable, with good aneurysm coverage. The device is widely open with good vessel wall apposition in its proximal and distal ends. Significant contrast stasis is still seen. There is no thrombus visualized within the device.  Right internal carotid artery, head (10-min post-deployment):  The ICA, ACA, and MCA are widely patent. The device position is stable, with good aneurysm coverage. The device is widely open with good vessel wall apposition in its proximal and distal ends. Significant contrast stasis is still seen. There is no thrombus visualized within the device.  Right internal carotid artery, head (final control):  Injection reveals the presence of a widely patent ICA that leads to a patent ACA and MCA. The pipeline device is widely patent with good  vessel wall apposition. No thrombus visualized with the pipeline device. Significant contrast stasis is seen within the aneurysm. The parenchymal and venous phases are unremarkable.  DISPOSITION:  After completion of the procedure, the femoral sheath was removed and hemostasis secured by application of a closure device. The procedure was well tolerated and no early complications were observed.  The patient was extubated in the angiography suite. Upon extubation, patient was following commands and moving all extremities well. The patient was transferred to the PACU for further recovery.  IMPRESSION:  1. Successful Pipeline embolization of a paraophthalmic right internal carotid artery aneurysm without local thrombotic or distal embolic complication.  The patient's family was informed of the preliminary results of the procedure.   Electronically Signed   By: Consuella Lose   On: 01/15/2015 10:32   Dg Abd 2 Views  01/17/2015   CLINICAL DATA:  Followup small bowel obstruction.  Abdominal pain.  EXAM: ABDOMEN - 2 VIEW  COMPARISON:  CT, 01/16/2015  FINDINGS: There is no small bowel dilation on the current radiograph to suggest residual small bowel obstruction. Residual contrast is noted in a normal caliber colon. There is residual intravenous contrast in the bladder.  Soft tissues are unremarkable. No evidence renal or ureteral stones.  NG tube well positioned with its tip in the distal stomach.  There are degenerative changes of the visualized spine. Left hip prosthesis is incompletely imaged but appears well seated and aligned.  IMPRESSION: 1. Resolved small bowel obstruction.  No new abnormalities.   Electronically Signed   By: Lajean Manes M.D.   On: 01/17/2015 09:59   Ir Angio Intra Extracran Sel Internal Carotid Uni R Mod Sed  01/15/2015   PROCEDURE: PIPELINE EMBOLIZATION OF RIGHT INTERNAL CAROTID ARTERY ANEURYSM  OPERATOR:  Dr. Consuella Lose, MD  HISTORY:  The patient is a 59 y.o. 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 He now presents for elective pipeline embolization.  APPROACH:  The technical aspects of the procedure as well as its potential risks and benefits were reviewed with the patient and family . These risks included but were not limited bleeding, infection, allergic reaction, damage to organs/vital structures, stroke, non-diagnostic procedure, and the catastrophic outcomes of heart attack, coma, and death. With an understanding of these risks, informed consent was obtained and witnessed.  The patient was placed in the supine position on the angiography table and the skin of right groin prepped in the usual sterile fashion. The procedure was performed under general anesthesia.  An 8- [place] sheath was introduced in the right common femoral artery using Seldinger technique. A fluorophase sequence was used to document the sheath position.  HEPARIN: 5000 Units total.  CONTRAST AGENT: 60cc, Omnipaque 300  FLUOROSCOPY TIME: 28.6 combined AP and lateral minutes  CATHETER(S) AND WIRE(S):  6-French Penumbra Select vert catheter  088 NeuronMax guide sheath  115cm Catalyst 5 catheter  150cm Via 27 microcatheter  Synchro2 STD microwire  VESSELS CATHETERIZED:  Right internal carotid artery  Right middle cerebral artery  VESSELS STUDIED:  Right internal carotid artery, head: AP, lateral (pre-embolization)  Right middle cerebral artery, head: AP, lateral  Right internal carotid artery, head: AP, lateral (immediate post embolization)  Right internal carotid artery, head: AP, lateral (5 minutes postembolization)  Right internal carotid artery, head: AP, lateral (10 minutes postembolization)  Right internal carotid artery, head: AP, lateral (final control)  PROCEDURAL NARRATIVE:  Under real-time fluoroscopy, the select catheter was advanced into the right common  carotid artery over an 0.035" guidewire. The guide sheath was then advanced into the common carotid artery. After roadmap was taken, the guide sheath was advanced into the proximal internal carotid artery over the support of the select catheter and guidewire. The  Shuttle Select catheter was removed and a 058 catalyst guidecatheter was coaxially introduced and advanced over the 027 microcatheter and microwire.  Under roadmap guidance, the microcatheter was advanced over a Synchro 2 standard microwire into the internal carotid artery and then into the MCA. The guide catheter was then tracked over the  Microcatheter to its final position in the distal petrous/proximal cavernous ICA. The Synchro 2 standard microwire was removed and Pipeline stent system was introduced after microcatheter run was taken.  Under real time fluoroscopy, the distal Pipeline device was opened in the MCA and withdrawn back into the supraclinoid ICA. Once satisfactory position of the Pipeline device was obtained, deployment was performed across the aneurysm neck under real-time fluoroscopy. Several single shots were performed during stent deployment to document its stability and appropriate deployment. Once deployment was complete, the microcatheter was then advanced through the Pipeline device and the pusher wire was recaptured and removed without incident. Cerebral angiography was performed immediately post deployment and at 5- and 10- minutes post deployment to document patency of the parent vessel and adequate positioning of the Pipeline. The microcatheter was subsequently removed without incident. Final control angiography was then performed from the guide catheter.  The guide catheter and shuttle sheath were then removed synchronously.  INTERPRETATION:  Right femoral artery:  The right femoral artery is unremarkable, arterial sheath in adequate position.  Right internal carotid artery, head (pre-embolization):  Injection reveals the  presence of a widely patent ICA that leads to a patent ACA and MCA. The previously described paraophthalmic ICA aneurysm is again visualized. The visualized portions of capillary and venous phases are unremarkable.  Right internal carotid artery, head (immediate post-PED deployment):  Injection reveals the presence of a widely patent ICA that leads to a patent ACA and MCA. The pipeline device is positioned across the neck of the aneurysm, and extends from the cavernous to supraclinoid ICA. The pipeline device is widely open with good vessel wall apposition in its proximal and distal ends. There is no thrombus visualized within the pipeline device.  Right internal carotid artery, head (5-min post-deployment):  The ICA, ACA, and MCA are widely patent. The device position is stable, with good aneurysm coverage. The device is widely open with good vessel wall apposition in its proximal and distal ends. Significant contrast stasis is still seen. There is no thrombus visualized within the device.  Right internal carotid artery, head (10-min post-deployment):  The ICA, ACA, and MCA are widely patent. The device position is stable, with good aneurysm coverage. The device is widely open with good vessel wall apposition in its proximal and distal ends. Significant contrast stasis is still seen. There is no thrombus visualized within the device.  Right internal carotid artery, head (final control):  Injection reveals the presence of a widely patent ICA that leads to a patent ACA and MCA. The pipeline device is widely patent with good vessel wall apposition. No thrombus visualized with the pipeline device. Significant contrast stasis is seen within the aneurysm. The parenchymal and venous phases are unremarkable.  DISPOSITION:  After completion of the procedure, the femoral sheath was removed and hemostasis secured by application of a closure device. The procedure was well tolerated and no early complications were observed.   The patient was extubated in the angiography suite. Upon extubation, patient was following commands and moving all extremities well. The patient was transferred to the PACU for further recovery.  IMPRESSION:  1. 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  The patient's family was informed of the preliminary results of the procedure.   Electronically Signed   By: Consuella Lose   On: 01/15/2015 10:32    Microbiology: No results found for this or any previous visit (from the past 240 hour(s)).   Labs: Basic Metabolic Panel:  Recent Labs Lab 01/16/15 2035 01/17/15 0608 01/18/15 0743  NA 138 141 140  K 3.7 4.1 3.7  CL 103 105 106  CO2 23 30 27   GLUCOSE 119* 98 88  BUN 13 14 13   CREATININE 1.15 0.89 0.83  CALCIUM 10.3 8.9 8.8*   Liver Function Tests:  Recent Labs Lab 01/16/15 2035 01/17/15 0608  AST 25 18  ALT 26 18  ALKPHOS 79 54  BILITOT 0.5 0.5  PROT 8.9* 6.5  ALBUMIN 5.2* 3.8   No results for input(s): LIPASE, AMYLASE in the last 168 hours. No results for input(s): AMMONIA in the last 168 hours. CBC:  Recent Labs Lab 01/16/15 2035 01/17/15 0608 01/18/15 0743  WBC 16.8* 6.6 5.3  NEUTROABS 15.2*  --   --   HGB 16.1 13.2 12.8*  HCT 47.8 40.8 40.1  MCV 83.9 85.4 85.1  PLT 285 259 239   Cardiac Enzymes: No results for input(s): CKTOTAL, CKMB, CKMBINDEX, TROPONINI in the last 168 hours. BNP: BNP (last 3 results) No results for input(s): BNP in the last 8760 hours.  ProBNP (last 3 results) No results for input(s): PROBNP in the last 8760 hours.  CBG: No results for input(s): GLUCAP in the last 168 hours.     SignedKelvin Cellar  [facility] 01/18/2015, 4:26 PM

## 2015-02-08 ENCOUNTER — Ambulatory Visit (HOSPITAL_COMMUNITY): Payer: 59

## 2015-05-01 ENCOUNTER — Encounter: Payer: Self-pay | Admitting: Surgery

## 2015-05-01 ENCOUNTER — Other Ambulatory Visit: Payer: Self-pay

## 2015-05-01 DIAGNOSIS — I83813 Varicose veins of bilateral lower extremities with pain: Secondary | ICD-10-CM

## 2015-06-04 ENCOUNTER — Other Ambulatory Visit: Payer: Self-pay | Admitting: Neurosurgery

## 2015-06-05 ENCOUNTER — Other Ambulatory Visit (HOSPITAL_COMMUNITY): Payer: Self-pay | Admitting: Neurosurgery

## 2015-06-05 DIAGNOSIS — I671 Cerebral aneurysm, nonruptured: Secondary | ICD-10-CM

## 2015-06-21 ENCOUNTER — Encounter: Payer: Self-pay | Admitting: Surgery

## 2015-06-25 ENCOUNTER — Ambulatory Visit (INDEPENDENT_AMBULATORY_CARE_PROVIDER_SITE_OTHER): Payer: Commercial Managed Care - HMO | Admitting: Surgery

## 2015-06-25 ENCOUNTER — Ambulatory Visit (HOSPITAL_COMMUNITY)
Admission: RE | Admit: 2015-06-25 | Discharge: 2015-06-25 | Disposition: A | Discharge status: Home / Self Care | Payer: Commercial Managed Care - HMO | Source: Ambulatory Visit | Attending: Surgery | Admitting: Surgery

## 2015-06-25 ENCOUNTER — Encounter: Payer: Self-pay | Admitting: Surgery

## 2015-06-25 VITALS — BP 154/94 | HR 87 | Temp 97.7°F | Resp 20 | Ht 71.5 in | Wt 208.0 lb

## 2015-06-25 DIAGNOSIS — I868 Varicose veins of other specified sites: Secondary | ICD-10-CM

## 2015-06-25 DIAGNOSIS — I83813 Varicose veins of bilateral lower extremities with pain: Secondary | ICD-10-CM | POA: Insufficient documentation

## 2015-06-25 DIAGNOSIS — I872 Venous insufficiency (chronic) (peripheral): Secondary | ICD-10-CM | POA: Insufficient documentation

## 2015-06-25 NOTE — Progress Notes (Signed)
Patient name: Philip Gates MRN: [id number] DOB: 1955/12/23 Sex: male   Referred by: Dr. Nevada Crane  Reason for referral:  Chief Complaint  Patient presents with  . New Evaluation    ref. by Dr. Dyann Ruddle for Bilat Chronic Venous Insufficiency     HISTORY OF PRESENT ILLNESS: This is a 59 year old gentleman who comes in today for evaluation of bilateral leg pain.  He states that he has problems in his feet and legs which can become numb and tingling.  He has been diagnosed with neuropathy.  He also complains of occasional pain within his varicose veins on his lower extremities.  The right leg bothers him more than the left.  He is no more compression stockings.  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  He must take Plavix for 1 year.  Patient has a history of bipolar disorder.  He suffers from reflux disease.  He is undergone a right knee replacement and left hip replacement.  Past Medical History  Diagnosis Date  . Asthma     ACTIVITY INDUCED  . Sinus drainage   . Arthritis   . GERD (gastroesophageal reflux disease)   . Bipolar 1 disorder ([place])   . Neck pain, chronic   . Cellulitis 05/2014    right side face  . 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 BPH (benign prostatic hyperplasia)   . Venous insufficiency   . 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  . Acid reflux   . Cerebral aneurysm     Past Surgical History  Procedure Laterality Date  . Nose surgery    . Total hip arthroplasty Left 01/03/2013    Procedure: TOTAL HIP ARTHROPLASTY ANTERIOR APPROACH;  Surgeon: Gearlean Alf, MD;  Location: [facility];  Service: Orthopedics;  Laterality: Left;  . Dental implant  09/26/2014    left lower dental implant  . Total knee arthroplasty Right 10/06/2014    Procedure: RIGHT TOTAL KNEE ARTHROPLASTY;  Surgeon: Gearlean Alf, MD;  Location: [facility];  Service:  Orthopedics;  Laterality: Right;  . Radiology with anesthesia N/A 12/29/2014    Procedure: Embolization;  Surgeon: Consuella Lose, MD;  Location: [facility];  Service: Radiology;  Laterality: N/A;  . Cerebral stent    . Joint replacement Right     Knee  . Joint replacement Left     Hip    Social History   Social History  . Marital Status: Married    Spouse Name: N/A  . Number of Children: N/A  . Years of Education: N/A   Occupational History  . Not on file.   Social History Main Topics  . Smoking status: Former Smoker -- 0.50 packs/day    Types: Cigarettes    Start date: 05/23/2015  . Smokeless tobacco: Not on file     Comment: Stated "I smoke off and on"  . Alcohol Use: No  . Drug Use: No  . Sexual Activity: Not on file   Other Topics Concern  . Not on file   Social History Narrative    Family History  Problem Relation Age of Onset  . Arrhythmia Mother     Has pacemaker  . Hypertension Mother   . Heart disease Mother   . Mental illness Mother   . Varicose Veins Mother   . Colon cancer Father   . Cancer  Father     Prostate and Colon  . Diabetes Father   . Hypertension Father     Allergies as of 06/25/2015 - Review Complete 06/25/2015  Allergen Reaction Noted  . Aspirin Other (See Comments) 10/02/2014  . Penicillins Rash 12/21/2012    Current Outpatient Prescriptions on File Prior to Visit  Medication Sig Dispense Refill  . aspirin 325 MG tablet Take 325 mg by mouth once.    . clopidogrel (PLAVIX) 75 MG tablet Take 75 mg by mouth daily.    Marland Kitchen dexlansoprazole (DEXILANT) 60 MG capsule Take 60 mg by mouth every morning.     . divalproex (DEPAKOTE ER) 500 MG 24 hr tablet Take 500 mg by mouth at bedtime.     . Dutasteride-Tamsulosin HCl (JALYN) 0.5-0.4 MG CAPS Take 1 capsule by mouth daily at 12 noon.     Marland Kitchen HYDROcodone-acetaminophen (NORCO) 7.5-325 MG per tablet Take 1-2 tablets by mouth every 6 (six) hours as needed for moderate pain.    Marland Kitchen ipratropium  (ATROVENT) 0.03 % nasal spray Place 2 sprays into both nostrils every 12 (twelve) hours as needed for rhinitis.    Marland Kitchen lamoTRIgine (LAMICTAL) 200 MG tablet Take 200 mg by mouth at bedtime.    . levalbuterol (XOPENEX HFA) 45 MCG/ACT inhaler Inhale 1-2 puffs into the lungs every 6 (six) hours as needed for wheezing.     Marland Kitchen lisdexamfetamine (VYVANSE) 70 MG capsule Take 70 mg by mouth every morning.     . montelukast (SINGULAIR) 10 MG tablet Take 10 mg by mouth every morning.     . ondansetron (ZOFRAN) 4 MG tablet Take 1 tablet (4 mg total) by mouth every 6 (six) hours as needed for nausea. 20 tablet 0  . RABEprazole (ACIPHEX) 20 MG tablet Take 20 mg by mouth every evening.     . tadalafil (CIALIS) 20 MG tablet Take 20 mg by mouth daily as needed for erectile dysfunction.    Marland Kitchen testosterone cypionate (DEPOTESTOTERONE CYPIONATE) 200 MG/ML injection Inject 200 mg into the muscle every 14 (fourteen) days.     . methocarbamol (ROBAXIN) 500 MG tablet Take 1 tablet (500 mg total) by mouth every 6 (six) hours as needed for muscle spasms. (Patient not taking: Reported on 06/25/2015) 80 tablet 1  . traMADol (ULTRAM) 50 MG tablet Take 1-2 tablets (50-100 mg total) by mouth every 6 (six) hours as needed for moderate pain. (Patient not taking: Reported on 12/15/2014) 90 tablet 1   No current facility-administered medications on file prior to visit.     REVIEW OF SYSTEMS: Cardiovascular: Positive for shortness of breath with exertion, pain in legs on walking and when lying flat, varicose veins. Pulmonary: Positive for asthma and wheezing. Neurologic: Positive for weakness and numbness in his arms and legs No dizziness. Hematologic: No bleeding problems or clotting disorders. Musculoskeletal: No joint pain or joint swelling. Gastrointestinal: No blood in stool or hematemesis Genitourinary: No dysuria or hematuria. Psychiatric:: No history of major depression. Integumentary: No rashes or ulcers. Constitutional: No  fever or chills.  PHYSICAL EXAMINATION:  Filed Vitals:   06/25/15 1338 06/25/15 1342  BP: 152/94 154/94  Pulse: 87   Temp: 97.7 F (36.5 C)   TempSrc: Oral   Resp: 20   Height: 5' 11.5" (1.816 m)   Weight: 208 lb (94.348 kg)    Body mass index is 28.61 kg/(m^2). General: The patient appears their stated age.   HEENT:  No gross abnormalities Pulmonary: Respirations are non-labored Musculoskeletal: There are no  major deformities.   Neurologic: No focal weakness or paresthesias are detected, Skin: There are no ulcer or rashes noted. Psychiatric: The patient has normal affect. Cardiovascular: There is a regular rate and rhythm without significant murmur appreciated.  Palpable pedal pulses.  1+ pitting edema to the right leg, trace edema to the left  Diagnostic Studies: I have reviewed his venous insufficiency ultrasound today.  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  There is reflux within the right common femoral and superficial femoral vein.  There is reflux in the left common femoral superficial femoral and popliteal vein.  There is reflux in the right great saphenous vein with maximum diameter 0.82 cm.  There is reflux in the left great saphenous vein with maximum diameter of 0.70 cm     Assessment:   Chronic venous insufficiency Plan:  I discussed with the patient that his symptoms of neuropathy which are pain and numbness in his feet will likely not be helped with venous ablation.  He does have pain in his varicose veins which would be improved.  Some of his symptoms may be related to the edema in his legs.  Therefore before proceeding with any procedure, I think he would benefit from a trial of compression stockings.  I have given him 20-30 thigh-high compression here in the office.  He is to follow-up in 3 months to see if he has gotten any benefit from the stockings.  If he  has noticed some benefit, consideration for endovenous laser ablation of the saphenous vein on the right and left would be considered.     Eldridge Abrahams, M.D. [facility] Office: [phone] Pager:  [phone]

## 2015-07-04 DIAGNOSIS — Z0279 Encounter for issue of other medical certificate: Secondary | ICD-10-CM | POA: Diagnosis not present

## 2015-07-17 ENCOUNTER — Other Ambulatory Visit (HOSPITAL_COMMUNITY): Payer: Self-pay | Admitting: Neurosurgery

## 2015-07-17 ENCOUNTER — Ambulatory Visit (HOSPITAL_COMMUNITY)
Admission: RE | Admit: 2015-07-17 | Discharge: 2015-07-17 | Disposition: A | Discharge status: Home / Self Care | Payer: Commercial Managed Care - HMO | Source: Ambulatory Visit | Attending: Neurosurgery | Admitting: Neurosurgery

## 2015-07-17 DIAGNOSIS — I872 Venous insufficiency (chronic) (peripheral): Secondary | ICD-10-CM | POA: Insufficient documentation

## 2015-07-17 DIAGNOSIS — Z7902 Long term (current) use of antithrombotics/antiplatelets: Secondary | ICD-10-CM | POA: Diagnosis not present

## 2015-07-17 DIAGNOSIS — Z87891 Personal history of nicotine dependence: Secondary | ICD-10-CM | POA: Diagnosis not present

## 2015-07-17 DIAGNOSIS — N400 Enlarged prostate without lower urinary tract symptoms: Secondary | ICD-10-CM | POA: Insufficient documentation

## 2015-07-17 DIAGNOSIS — F319 Bipolar disorder, unspecified: Secondary | ICD-10-CM | POA: Insufficient documentation

## 2015-07-17 DIAGNOSIS — I671 Cerebral aneurysm, nonruptured: Secondary | ICD-10-CM | POA: Insufficient documentation

## 2015-07-17 DIAGNOSIS — Z48812 Encounter for surgical aftercare following surgery on the circulatory system: Secondary | ICD-10-CM | POA: Insufficient documentation

## 2015-07-17 DIAGNOSIS — K219 Gastro-esophageal reflux disease without esophagitis: Secondary | ICD-10-CM | POA: Insufficient documentation

## 2015-07-17 DIAGNOSIS — M1990 Unspecified osteoarthritis, unspecified site: Secondary | ICD-10-CM | POA: Diagnosis not present

## 2015-07-17 DIAGNOSIS — Z7982 Long term (current) use of aspirin: Secondary | ICD-10-CM | POA: Diagnosis not present

## 2015-07-17 DIAGNOSIS — J45909 Unspecified asthma, uncomplicated: Secondary | ICD-10-CM | POA: Diagnosis not present

## 2015-07-17 DIAGNOSIS — Z880 Allergy status to penicillin: Secondary | ICD-10-CM | POA: Diagnosis not present

## 2015-07-17 HISTORY — PX: SUBCLAVIAN ANGIOGRAM: SHX5350

## 2015-07-17 LAB — URINALYSIS, ROUTINE W REFLEX MICROSCOPIC
Bilirubin Urine: NEGATIVE
Glucose, UA: NEGATIVE mg/dL
Hgb urine dipstick: NEGATIVE
Ketones, ur: NEGATIVE mg/dL
LEUKOCYTES UA: NEGATIVE
NITRITE: NEGATIVE
PROTEIN: NEGATIVE mg/dL
Specific Gravity, Urine: 1.016 (ref 1.005–1.030)
UROBILINOGEN UA: 0.2 mg/dL (ref 0.0–1.0)
pH: 6.5 (ref 5.0–8.0)

## 2015-07-17 LAB — CBC WITH DIFFERENTIAL/PLATELET
BASOS ABS: 0 10*3/uL (ref 0.0–0.1)
Basophils Relative: 1 %
Eosinophils Absolute: 0.1 10*3/uL (ref 0.0–0.7)
Eosinophils Relative: 2 %
HEMATOCRIT: 40.5 % (ref 39.0–52.0)
Hemoglobin: 13.2 g/dL (ref 13.0–17.0)
LYMPHS PCT: 46 %
Lymphs Abs: 2.4 10*3/uL (ref 0.7–4.0)
MCH: 26.8 pg (ref 26.0–34.0)
MCHC: 32.6 g/dL (ref 30.0–36.0)
MCV: 82.3 fL (ref 78.0–100.0)
MONO ABS: 0.4 10*3/uL (ref 0.1–1.0)
Monocytes Relative: 8 %
NEUTROS ABS: 2.3 10*3/uL (ref 1.7–7.7)
Neutrophils Relative %: 43 %
Platelets: 213 10*3/uL (ref 150–400)
RBC: 4.92 MIL/uL (ref 4.22–5.81)
RDW: 15.6 % — AB (ref 11.5–15.5)
WBC: 5.3 10*3/uL (ref 4.0–10.5)

## 2015-07-17 LAB — BASIC METABOLIC PANEL
ANION GAP: 9 (ref 5–15)
BUN: 10 mg/dL (ref 6–20)
CO2: 27 mmol/L (ref 22–32)
Calcium: 9.8 mg/dL (ref 8.9–10.3)
Chloride: 101 mmol/L (ref 101–111)
Creatinine, Ser: 0.91 mg/dL (ref 0.61–1.24)
GFR calc Af Amer: >60 mL/min (ref >60)
GLUCOSE: 106 mg/dL — AB (ref 65–99)
POTASSIUM: 4.2 mmol/L (ref 3.5–5.1)
Sodium: 137 mmol/L (ref 135–145)

## 2015-07-17 LAB — PROTIME-INR
INR: 0.93 (ref 0.00–1.49)
Prothrombin Time: 12.7 seconds (ref 11.6–15.2)

## 2015-07-17 LAB — APTT: aPTT: 21 seconds — ABNORMAL LOW (ref 24–37)

## 2015-07-17 IMAGING — XA IR CAROTID INTERNAL HEAD/NECK BILAT  (MS)
7 series · 15 of 24 positions shown · IV contrast (IODINE)
Comparison: none

[Series 3: carotid care · 2 acquisitions, 1 frame shown (1 of 5)]
[im 1/2  full-range]
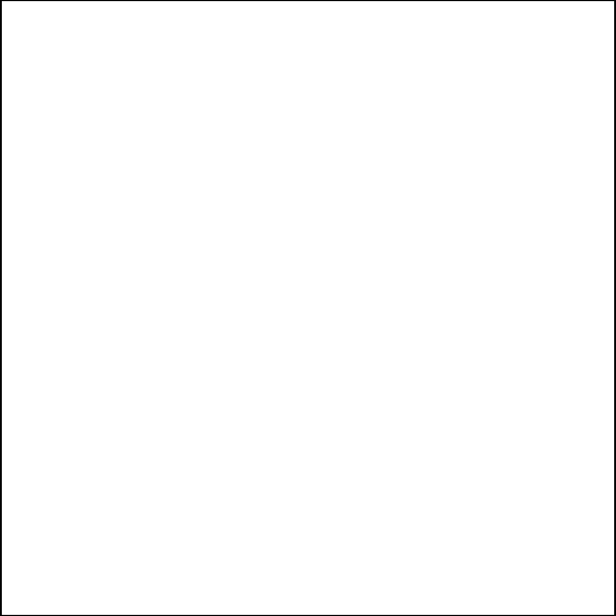

[Series 4: carotid care · 2 acquisitions, 2 frames shown (2 of 5)]
[im 1/2]
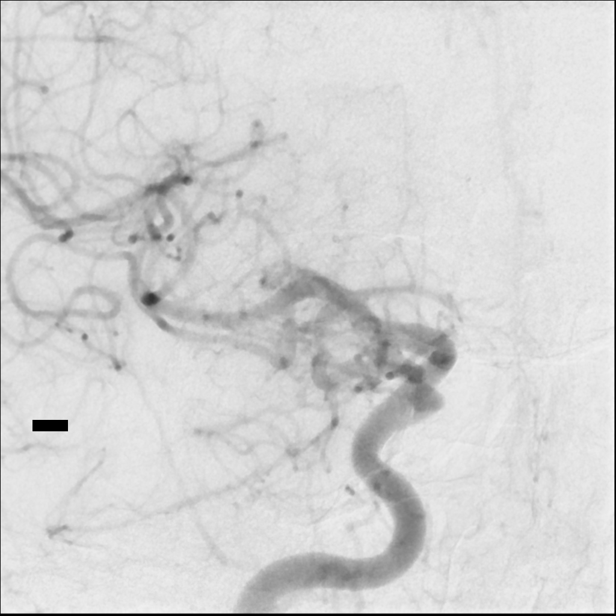
[im 2/2]
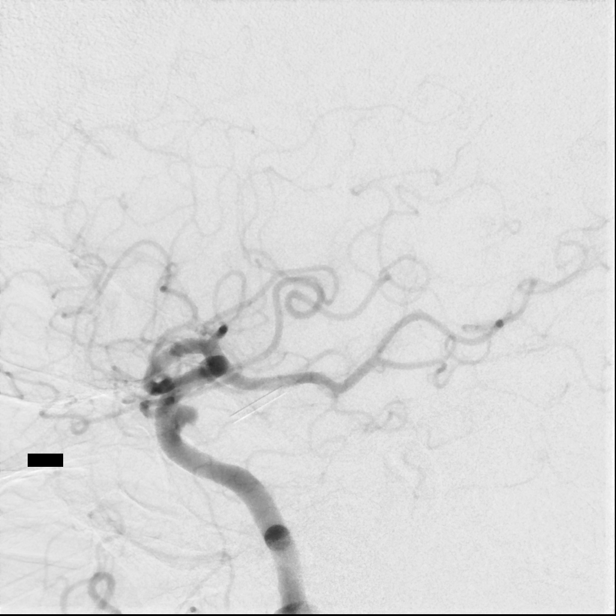

[Series 5: carotid care · 2 acquisitions, 3 frames shown (3 of 5)]
[im 1/2  full-range]
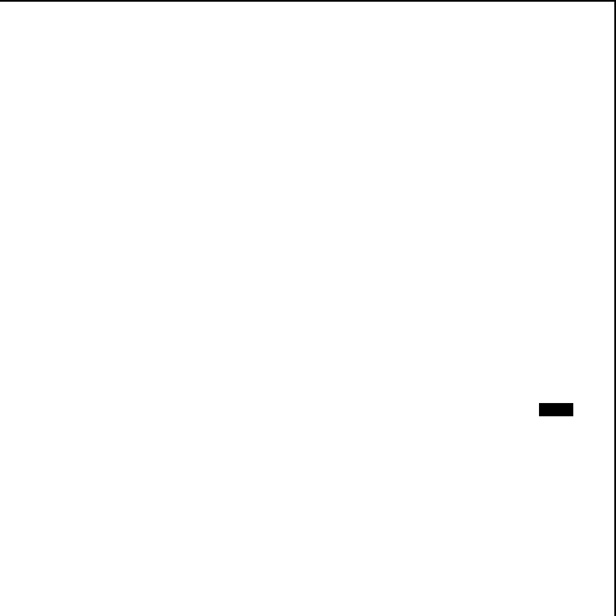
[im 2/2]
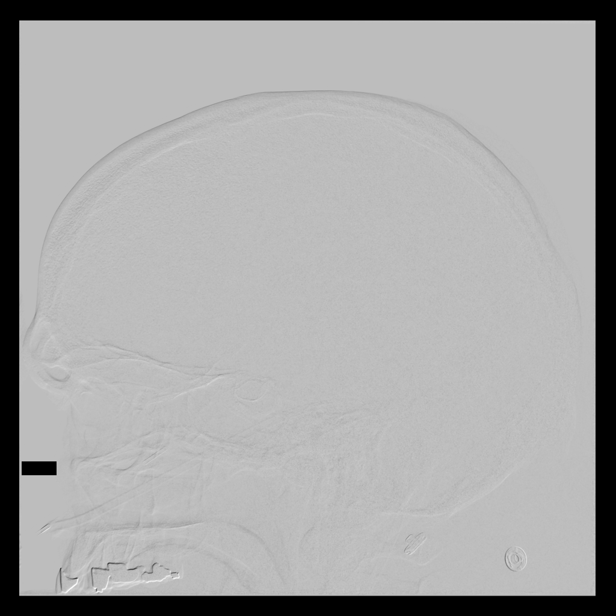
[im 2/2]
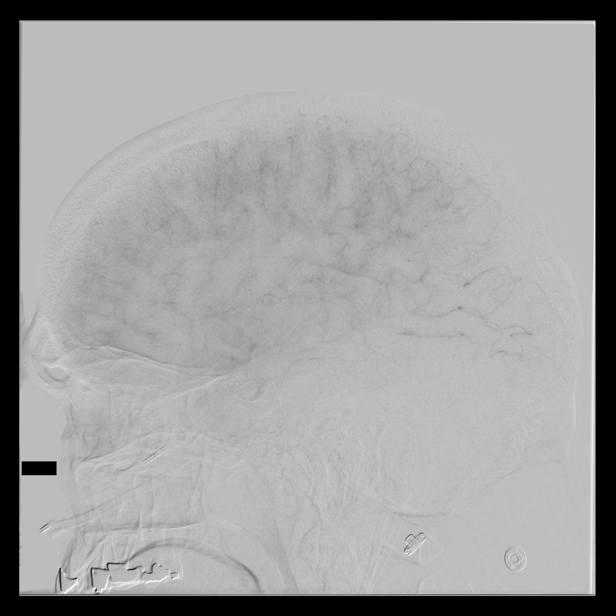

[Series 6: carotid care · 2 acquisitions, 1 frame shown (4 of 5)]
[im 1/2]
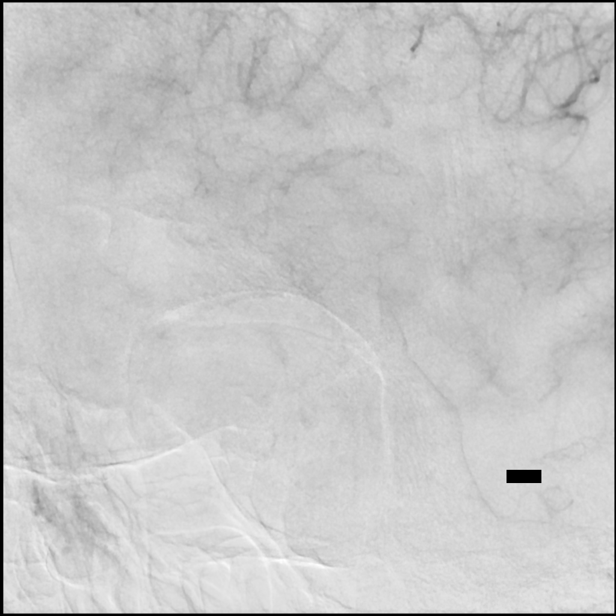

[Series 7: carotid care · 2 acquisitions, 3 frames shown (5 of 5)]
[im 1/2]
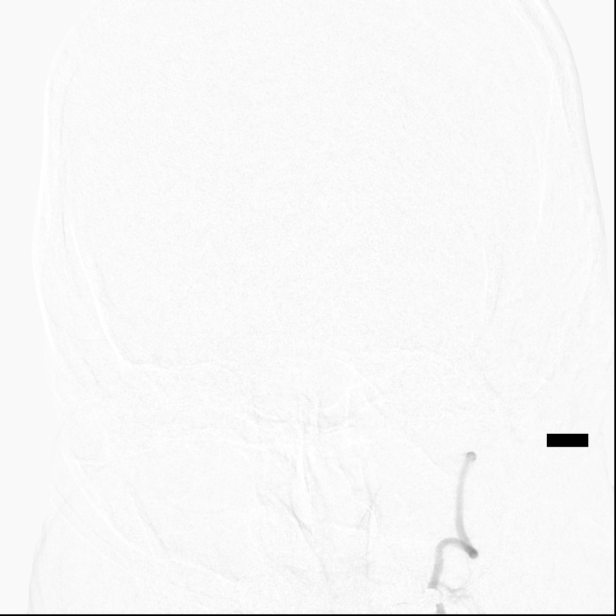
[im 1/2]
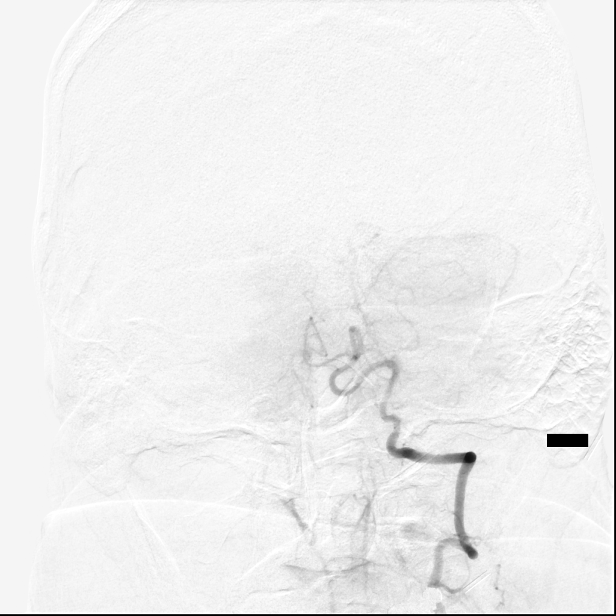
[im 2/2]
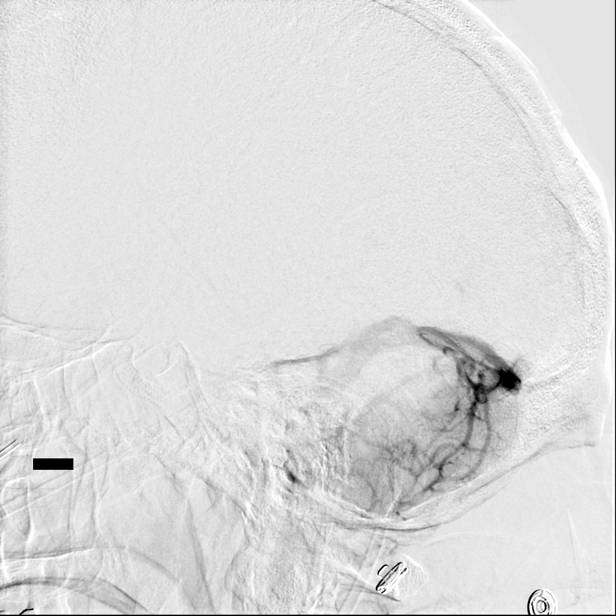

[Series 8: fl angio · 1 of 14 frames shown]
[frame 3/14]
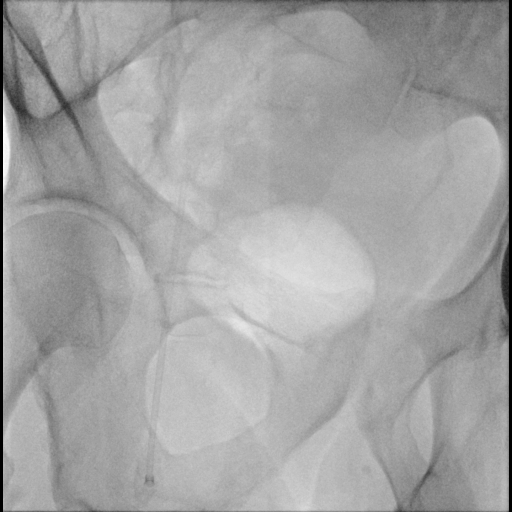

[Series 300: ir angio intra extracran sel com carotid · 4 of 16 slices shown]
[im 2/16]
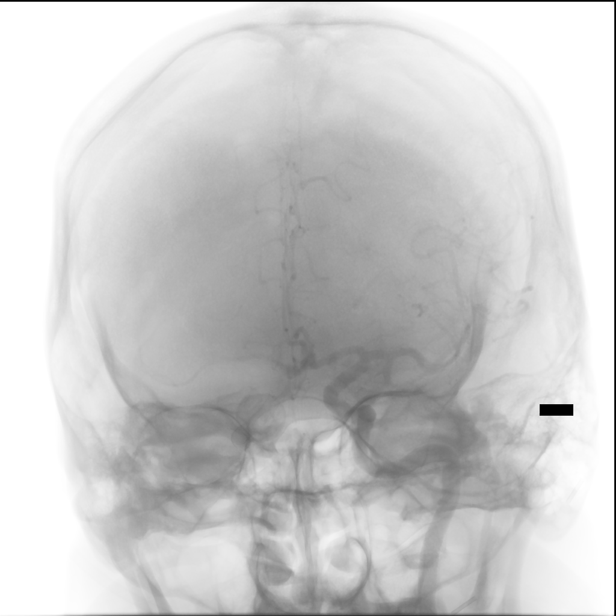
[im 7/16]
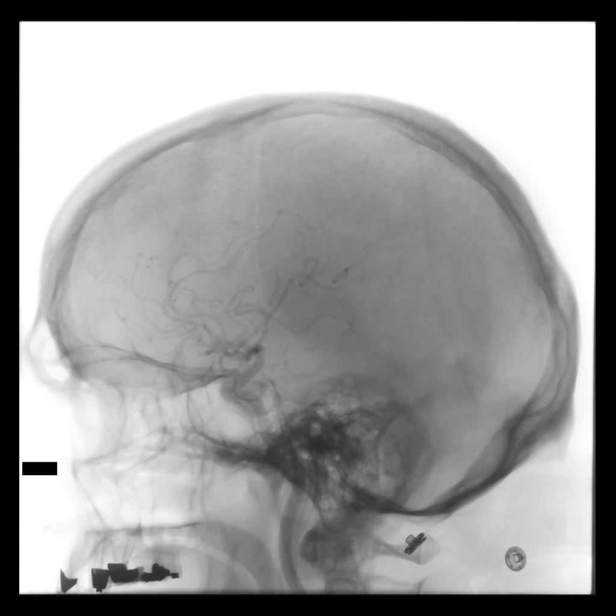
[im 10/16]
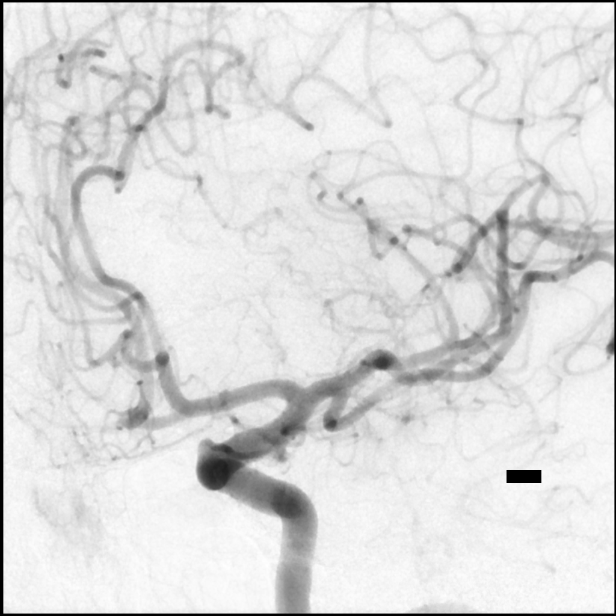
[im 16/16]
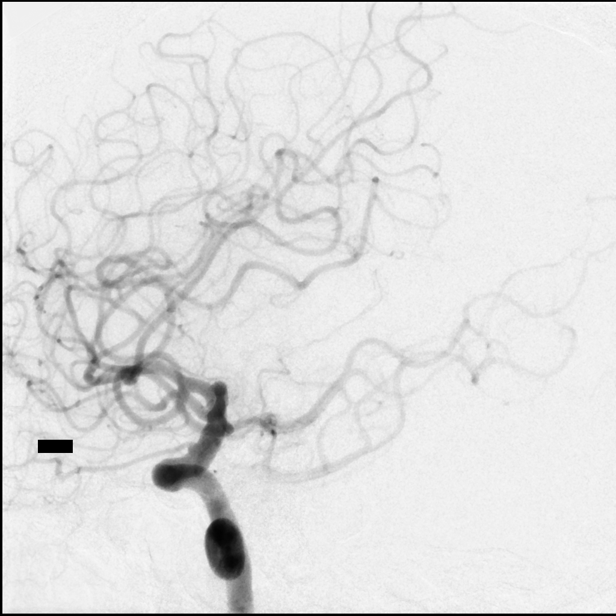

[15 of 24 positions shown; findings below may reference images not displayed]

Canned report from images found in remote index.

Refer to host system for actual result text.

## 2015-07-17 MED ORDER — HEPARIN SODIUM (PORCINE) 1000 UNIT/ML IJ SOLN
2000.0000 [IU] | Freq: Once | INTRAMUSCULAR | Status: AC
  Administered 2015-07-17: 2000 [IU] via INTRAVENOUS

## 2015-07-17 MED ORDER — SODIUM CHLORIDE 0.9 % IV SOLN: INTRAVENOUS | Status: DC

## 2015-07-17 MED ORDER — NALOXONE HCL 0.4 MG/ML IJ SOLN
INTRAMUSCULAR | Status: AC
  Filled 2015-07-17: qty 1

## 2015-07-17 MED ORDER — FENTANYL CITRATE (PF) 100 MCG/2ML IJ SOLN
INTRAMUSCULAR | Status: AC | PRN
  Administered 2015-07-17: 50 ug via INTRAVENOUS

## 2015-07-17 MED ORDER — HYDROCODONE-ACETAMINOPHEN 5-325 MG PO TABS: 1.0000 | ORAL_TABLET | ORAL | Status: DC | PRN

## 2015-07-17 MED ORDER — HEPARIN SODIUM (PORCINE) 1000 UNIT/ML IJ SOLN
INTRAMUSCULAR | Status: AC
  Filled 2015-07-17: qty 1

## 2015-07-17 MED ORDER — MIDAZOLAM HCL 2 MG/2ML IJ SOLN
INTRAMUSCULAR | Status: AC | PRN
  Administered 2015-07-17: 1 mg via INTRAVENOUS

## 2015-07-17 MED ORDER — IOHEXOL 300 MG/ML  SOLN
150.0000 mL | Freq: Once | INTRAMUSCULAR | Status: DC | PRN
  Administered 2015-07-17: 80 mL via INTRAVENOUS
  Filled 2015-07-17: qty 150

## 2015-07-17 MED ORDER — FLUMAZENIL 0.5 MG/5ML IV SOLN
INTRAVENOUS | Status: AC
  Filled 2015-07-17: qty 5

## 2015-07-17 MED ORDER — FENTANYL CITRATE (PF) 100 MCG/2ML IJ SOLN
INTRAMUSCULAR | Status: AC
  Filled 2015-07-17: qty 4

## 2015-07-17 MED ORDER — HYDRALAZINE HCL 20 MG/ML IJ SOLN
INTRAMUSCULAR | Status: AC
  Filled 2015-07-17: qty 1

## 2015-07-17 MED ORDER — LIDOCAINE HCL 1 % IJ SOLN
INTRAMUSCULAR | Status: AC
  Filled 2015-07-17: qty 20

## 2015-07-17 MED ORDER — MIDAZOLAM HCL 2 MG/2ML IJ SOLN
INTRAMUSCULAR | Status: AC
  Filled 2015-07-17: qty 4

## 2015-07-17 NOTE — H&P (Signed)
CC:  Aneurysm  HPI: Philip Gates is a 59 y.o. male who underwent Pipeline embolization of a right ICA aneurysm discovered incidentally after facial CT for cellulitis. He has done well postop, and presents today for routine f/u angiogram.  PMH: Past Medical History  Diagnosis Date  . Asthma     ACTIVITY INDUCED  . Sinus drainage   . Arthritis   . GERD (gastroesophageal reflux disease)   . Bipolar 1 disorder ([place])   . Neck pain, chronic   . Cellulitis 05/2014    right side face  . 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 BPH (benign prostatic hyperplasia)   . Venous insufficiency   . 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  . Acid reflux   . Cerebral aneurysm     PSH: Past Surgical History  Procedure Laterality Date  . Nose surgery    . Total hip arthroplasty Left 01/03/2013    Procedure: TOTAL HIP ARTHROPLASTY ANTERIOR APPROACH;  Surgeon: Gearlean Alf, MD;  Location: [facility];  Service: Orthopedics;  Laterality: Left;  . Dental implant  09/26/2014    left lower dental implant  . Total knee arthroplasty Right 10/06/2014    Procedure: RIGHT TOTAL KNEE ARTHROPLASTY;  Surgeon: Gearlean Alf, MD;  Location: [facility];  Service: Orthopedics;  Laterality: Right;  . Radiology with anesthesia N/A 12/29/2014    Procedure: Embolization;  Surgeon: Consuella Lose, MD;  Location: [facility];  Service: Radiology;  Laterality: N/A;  . Cerebral stent    . Joint replacement Right     Knee  . Joint replacement Left     Hip    SH: Social History  Substance Use Topics  . Smoking status: Former Smoker -- 0.50 packs/day    Types: Cigarettes    Start date: 05/23/2015  . Smokeless tobacco: Not on file     Comment: Stated "I smoke off and on"  . Alcohol Use: No    MEDS: Prior to Admission medications   Medication Sig Start Date End Date Taking? Authorizing Provider  aspirin 81 MG tablet Take 81 mg by mouth daily.    Yes Historical Provider, MD  clopidogrel (PLAVIX) 75 MG tablet Take 75 mg by mouth daily. 12/19/14  Yes Historical Provider, MD  dexlansoprazole (DEXILANT) 60 MG capsule Take 60 mg by mouth every morning.    Yes Historical Provider, MD  divalproex (DEPAKOTE ER) 500 MG 24 hr tablet Take 500 mg by mouth at bedtime.    Yes Historical Provider, MD  Dutasteride-Tamsulosin HCl (JALYN) 0.5-0.4 MG CAPS Take 1 capsule by mouth daily at 12 noon.    Yes Historical Provider, MD  HYDROcodone-acetaminophen (NORCO) 7.5-325 MG per tablet Take 1-2 tablets by mouth every 6 (six) hours as needed for moderate pain.   Yes Historical Provider, MD  ibuprofen (ADVIL,MOTRIN) 200 MG tablet Take 800 mg by mouth every 8 (eight) hours as needed for mild pain or moderate pain.   Yes Historical Provider, MD  ipratropium (ATROVENT) 0.03 % nasal spray Place 2 sprays into both nostrils every 12 (twelve) hours as needed for rhinitis.   Yes Historical Provider, MD  lamoTRIgine (LAMICTAL) 200 MG tablet Take 200 mg by mouth at bedtime.   Yes Historical Provider, MD  levalbuterol [facility] HFA) 45 MCG/ACT inhaler Inhale 1-2 puffs into the lungs every 6 (six) hours as needed for wheezing.    Yes Historical  Provider, MD  lisdexamfetamine (VYVANSE) 70 MG capsule Take 70 mg by mouth every morning.    Yes Historical Provider, MD  montelukast (SINGULAIR) 10 MG tablet Take 10 mg by mouth every morning.    Yes Historical Provider, MD  ondansetron (ZOFRAN) 4 MG tablet Take 1 tablet (4 mg total) by mouth every 6 (six) hours as needed for nausea. 01/18/15  Yes Kelvin Cellar, MD  Probiotic Product (PROBIOTIC DAILY PO) Take 1 capsule by mouth daily.   Yes Historical Provider, MD  RABEprazole (ACIPHEX) 20 MG tablet Take 20 mg by mouth every evening.    Yes Historical Provider, MD  tadalafil (CIALIS) 20 MG tablet Take 20 mg by mouth daily as needed for erectile dysfunction.   Yes Historical Provider, MD  testosterone cypionate (DEPOTESTOTERONE CYPIONATE)  200 MG/ML injection Inject 200 mg into the muscle every 14 (fourteen) days.  12/16/14  Yes Historical Provider, MD    ALLERGY: Allergies  Allergen Reactions  . Aspirin Other (See Comments)    Do not take with depakote.   Marland Kitchen Penicillins Rash    ROS: ROS  NEUROLOGIC EXAM: Awake, alert, oriented Memory and concentration grossly intact Speech fluent, appropriate CN grossly intact Motor exam: Upper Extremities Deltoid Bicep Tricep Grip  Right 5/5 5/5 5/5 5/5  Left 5/5 5/5 5/5 5/5   Lower Extremity IP Quad PF DF EHL  Right 5/5 5/5 5/5 5/5 5/5  Left 5/5 5/5 5/5 5/5 5/5   Sensation grossly intact to LT  IMPRESSION: - 59 y.o. male 6 mo s/p Pipeline embolization RICA aneurysm  PLAN: - Proceed with diagnostic cerebral angiogram - Likely home post-procedure  The risks and benefits of the procedure, including treatment alternatives were all discussed with the patient and his family in the office. After all questions were answered, informed consent was obtained.

## 2015-07-17 NOTE — Sedation Documentation (Signed)
Vital signs stable. 

## 2015-07-17 NOTE — Op Note (Signed)
DIAGNOSTIC CEREBRAL ANGIOGRAM    OPERATOR:   Dr. Consuella Lose, MD  HISTORY:   The patient is a 59 y.o. 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 He presents for routine f/u diagnostic cerebral angiogram.  APPROACH:   The technical aspects of the procedure as well as its potential risks and benefits were reviewed with the patient. These risks included but were not limited bleeding, infection, allergic reaction, damage to organs/vital structures, stroke, non-diagnostic procedure, and the catastrophic outcomes of heart attack, coma, and death. With an understanding of these risks, informed consent was obtained and witnessed.    The patient was placed in the supine position on the angiography table and the skin of right groin prepped in the usual sterile fashion. The procedure was performed under local anesthesia (1%-solution of bicarbonate-bufferred Lidoacaine) and conscious sedation with Versed and fentanyl monitored by the in-suite nurse.    A 5- French sheath was introduced in the right common femoral artery using Seldinger technique.  A fluorophase sequence was used to document the sheath position.    HEPARIN: 2000 Units total.   CONTRAST AGENT: 60cc, Omnipaque 300   FLUOROSCOPY TIME: See IR records    CATHETER(S) AND WIRE(S):    5-French JB-1 glidecatheter   0.035" glidewire    VESSELS CATHETERIZED:   Right internal carotid   Left internal carotid   Right vertebral   Left vertebral   Right common femoral  VESSELS STUDIED:   Right internal carotid, head Right vertebral Left internal carotid, head Left vertebral Right femoral  PROCEDURAL NARRATIVE:   A 5-Fr JB-1 terumo glide catheter was advanced over a 0.035 glidewire into the aortic arch. The above vessels were then sequentially catheterized and cervical/cerebral angiograms taken. After review of images, the catheter was removed without incident.    INTERPRETATION:   Right internal  carotid: head:   Injection reveals the presence of a widely patent ICA, M1, and A1 segments and their branches. Pipeline device is seen extending from the cavernous to supraclinoid internal carotid artery. There is no in-stent stenosis seen. There is continued filling of the aneurysm neck, now measuring approximately 3.6 x 3.7 mm. The parenchymal and venous phases are normal. The venous sinuses are widely patent.    Left internal carotid: head:   Injection reveals the presence of a widely patent ICA, A1, and M1 segments and their branches. There is no significant stenosis, occlusion, aneurysm, or high flow vascular malformation visualized. The parenchymal and venous phases are normal. The venous sinuses are widely patent.    Right vertebral:   Injection reveals the presence of a widely patent vertebral artery. This leads to a widely patent basilar artery that terminates in bilateral P1. The basilar apex is normal. There is no significant stenosis, occlusion, aneurysm, or vascular malformation visualized. The parenchymal and venous phases are normal. The venous sinuses are widely patent.    Left vertebral:    Normal vessel which terminates in a left PICA. No aneurysms are seen. See basilar description above.    Right femoral:    Normal vessel. No significant atherosclerotic disease. Arterial sheath in adequate position.   DISPOSITION:  Upon completion of the study, the femoral sheath was removed and hemostasis obtained using a 5-Fr ExoSeal closure device. Good proximal and distal lower extremity pulses were documented upon achievement of hemostasis.    The procedure was well tolerated and no early complications were observed.       The patient was transferred back  to the holding area to be positioned flat in bed for 3 hours of observation.    IMPRESSION:  1. 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 No in-stent stenosis is seen.  The preliminary results of this procedure were shared with the patient and the patient's family.

## 2015-07-17 NOTE — Discharge Instructions (Signed)
Angiogram, Care After °Refer to this sheet in the next few weeks. These instructions provide you with information about caring for yourself after your procedure. Your health care provider may also give you more specific instructions. Your treatment has been planned according to current medical practices, but problems sometimes occur. Call your health care provider if you have any problems or questions after your procedure. °WHAT TO EXPECT AFTER THE PROCEDURE °After your procedure, it is typical to have the following: °· Bruising at the catheter insertion site that usually fades within 1-2 weeks. °· Blood collecting in the tissue (hematoma) that may be painful to the touch. It should usually decrease in size and tenderness within 1-2 weeks. °HOME CARE INSTRUCTIONS °· Take medicines only as directed by your health care provider. °· You may shower 24-48 hours after the procedure or as directed by your health care provider. Remove the bandage (dressing) and gently wash the site with plain soap and water. Pat the area dry with a clean towel. Do not rub the site, because this may cause bleeding. °· Do not take baths, swim, or use a hot tub until your health care provider approves. °· Check your insertion site every day for redness, swelling, or drainage. °· Do not apply powder or lotion to the site. °· Do not lift over 10 lb (4.5 kg) for 5 days after your procedure or as directed by your health care provider. °· Ask your health care provider when it is okay to: °¨ Return to work or school. °¨ Resume usual physical activities or sports. °¨ Resume sexual activity. °· Do not drive home if you are discharged the same day as the procedure. Have someone else drive you. °· You may drive 24 hours after the procedure unless otherwise instructed by your health care provider. °· Do not operate machinery or power tools for 24 hours after the procedure or as directed by your health care provider. °· If your procedure was done as an  outpatient procedure, which means that you went home the same day as your procedure, a responsible adult should be with you for the first 24 hours after you arrive home. °· Keep all follow-up visits as directed by your health care provider. This is important. °SEEK MEDICAL CARE IF: °· You have a fever. °· You have chills. °· You have increased bleeding from the catheter insertion site. Hold pressure on the site. °SEEK IMMEDIATE MEDICAL CARE IF: °· You have unusual pain at the catheter insertion site. °· You have redness, warmth, or swelling at the catheter insertion site. °· You have drainage (other than a small amount of blood on the dressing) from the catheter insertion site. °· The catheter insertion site is bleeding, and the bleeding does not stop after 30 minutes of holding steady pressure on the site. °· The area near or just beyond the catheter insertion site becomes pale, cool, tingly, or numb. °  °This information is not intended to replace advice given to you by your health care provider. Make sure you discuss any questions you have with your health care provider. °  °Document Released: 03/13/2005 Document Revised: 09/15/2014 Document Reviewed: 01/26/2013 °Elsevier Interactive Patient Education ©2016 Else[employer] Inc[employer] °Angiogram, Care After °Refer to this sheet in the next few weeks. These instructions provide you with information about caring for yourself after your procedure. Your health care provider may also give you more specific instructions. Your treatment has been planned according to current medical practices, but problems sometimes occur. Call your   health care provider if you have any problems or questions after your procedure. °WHAT TO EXPECT AFTER THE PROCEDURE °After your procedure, it is typical to have the following: °· Bruising at the catheter insertion site that usually fades within 1-2 weeks. °· Blood collecting in the tissue (hematoma) that may be painful to the touch. It should usually  decrease in size and tenderness within 1-2 weeks. °HOME CARE INSTRUCTIONS °· Take medicines only as directed by your health care provider. °· You may shower 24-48 hours after the procedure or as directed by your health care provider. Remove the bandage (dressing) and gently wash the site with plain soap and water. Pat the area dry with a clean towel. Do not rub the site, because this may cause bleeding. °· Do not take baths, swim, or use a hot tub until your health care provider approves. °· Check your insertion site every day for redness, swelling, or drainage. °· Do not apply powder or lotion to the site. °· Do not lift over 10 lb (4.5 kg) for 5 days after your procedure or as directed by your health care provider. °· Ask your health care provider when it is okay to: °¨ Return to work or school. °¨ Resume usual physical activities or sports. °¨ Resume sexual activity. °· Do not drive home if you are discharged the same day as the procedure. Have someone else drive you. °· You may drive 24 hours after the procedure unless otherwise instructed by your health care provider. °· Do not operate machinery or power tools for 24 hours after the procedure or as directed by your health care provider. °· If your procedure was done as an outpatient procedure, which means that you went home the same day as your procedure, a responsible adult should be with you for the first 24 hours after you arrive home. °· Keep all follow-up visits as directed by your health care provider. This is important. °SEEK MEDICAL CARE IF: °· You have a fever. °· You have chills. °· You have increased bleeding from the catheter insertion site. Hold pressure on the site. °SEEK IMMEDIATE MEDICAL CARE IF: °· You have unusual pain at the catheter insertion site. °· You have redness, warmth, or swelling at the catheter insertion site. °· You have drainage (other than a small amount of blood on the dressing) from the catheter insertion site. °· The catheter  insertion site is bleeding, and the bleeding does not stop after 30 minutes of holding steady pressure on the site. °· The area near or just beyond the catheter insertion site becomes pale, cool, tingly, or numb. °  °This information is not intended to replace advice given to you by your health care provider. Make sure you discuss any questions you have with your health care provider. °  °Document Released: 03/13/2005 Document Revised: 09/15/2014 Document Reviewed: 01/26/2013 °Elsevier Interactive Patient Education ©2016 Elsevier Inc. ° °

## 2015-07-18 ENCOUNTER — Other Ambulatory Visit (HOSPITAL_COMMUNITY): Payer: Self-pay | Admitting: Neurosurgery

## 2015-07-18 DIAGNOSIS — I671 Cerebral aneurysm, nonruptured: Secondary | ICD-10-CM

## 2015-09-13 DIAGNOSIS — G8929 Other chronic pain: Secondary | ICD-10-CM | POA: Insufficient documentation

## 2015-09-13 DIAGNOSIS — M542 Cervicalgia: Secondary | ICD-10-CM | POA: Insufficient documentation

## 2015-09-13 DIAGNOSIS — M545 Low back pain, unspecified: Secondary | ICD-10-CM | POA: Insufficient documentation

## 2015-09-21 ENCOUNTER — Encounter: Payer: Self-pay | Admitting: Surgery

## 2015-09-24 HISTORY — PX: EYE SURGERY: SHX253

## 2015-10-01 ENCOUNTER — Ambulatory Visit (INDEPENDENT_AMBULATORY_CARE_PROVIDER_SITE_OTHER): Payer: Self-pay | Admitting: Surgery

## 2015-10-01 DIAGNOSIS — I872 Venous insufficiency (chronic) (peripheral): Secondary | ICD-10-CM

## 2015-10-01 NOTE — Progress Notes (Signed)
No show

## 2015-10-12 ENCOUNTER — Encounter (HOSPITAL_COMMUNITY): Payer: Self-pay | Admitting: Psychiatry

## 2015-10-12 ENCOUNTER — Ambulatory Visit (INDEPENDENT_AMBULATORY_CARE_PROVIDER_SITE_OTHER): Payer: Commercial Managed Care - HMO | Admitting: Psychiatry

## 2015-10-12 VITALS — BP 137/89 | HR 85 | Ht 71.5 in | Wt 205.0 lb

## 2015-10-12 DIAGNOSIS — F909 Attention-deficit hyperactivity disorder, unspecified type: Secondary | ICD-10-CM | POA: Insufficient documentation

## 2015-10-12 DIAGNOSIS — F3162 Bipolar disorder, current episode mixed, moderate: Secondary | ICD-10-CM

## 2015-10-12 DIAGNOSIS — F902 Attention-deficit hyperactivity disorder, combined type: Secondary | ICD-10-CM | POA: Diagnosis not present

## 2015-10-12 MED ORDER — LAMOTRIGINE 200 MG PO TABS: 200.0000 mg | ORAL_TABLET | Freq: Every day | ORAL | Status: DC

## 2015-10-12 MED ORDER — DIVALPROEX SODIUM ER 500 MG PO TB24: 500.0000 mg | ORAL_TABLET | Freq: Every day | ORAL | Status: DC

## 2015-10-12 MED ORDER — LISDEXAMFETAMINE DIMESYLATE 70 MG PO CAPS: 70.0000 mg | ORAL_CAPSULE | Freq: Every morning | ORAL | Status: DC

## 2015-10-12 NOTE — Progress Notes (Signed)
Psychiatric Initial Adult Assessment   Patient Identification: Philip Gates MRN:  [id number] Date of Evaluation:  10/12/2015 Referral Source: Dr. Nevada Crane Chief Complaint:   Chief Complaint    Manic Behavior; Depression; ADD; Establish Care     Visit Diagnosis:    ICD-9-CM ICD-10-CM   1. Bipolar 1 disorder, mixed, moderate (HCC) 296.62 F31.62 Valproic Acid level  2. Attention deficit hyperactivity disorder (ADHD), combined type 314.01 F90.2    Diagnosis:   Patient Active Problem List   Diagnosis Date Noted  . Bipolar 1 disorder, mixed, moderate ([facility]) [F31.62] 10/12/2015  . Attention deficit hyperactivity disorder (ADHD) [F90.9] 10/12/2015  . Chronic venous insufficiency [I87.2] 06/25/2015  . SBO (small bowel obstruction) ([facility]) [K56.69] 01/16/2015  . Cerebral aneurysm [I67.1] 12/29/2014  . OA (osteoarthritis) of knee [M17.9] 10/06/2014  . Facial cellulitis [id number] 05/31/2014  . Asthma [J45.909]   . GERD (gastroesophageal reflux disease) [K21.9]   . Bipolar 1 disorder ([facility] [place]) [F31.9]   . OA (osteoarthritis) of hip [M16.9] 01/03/2013   History of Present Illness:  This patient is a 60 year old married white male lives with his wife in [place]. 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  The patient was referred by his primary physician, Dr. Edwyna Ready call, for further assessment and treatment of bipolar disorder and ADHD.  The patient is a vague historian and he tends to ramble a lot. Apparently as a child he was very unfocused and hyperactive. He is always had difficulty completing tasks. He was never diagnosed with ADHD in childhood and he was able to complete high school and 1 year of machine shop training. He has worked in numerous jobs in Film/video editor.  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 He quit all this about 10 years ago. About 7 years ago  he became increasingly angry and moody and irritable. Some of it started when he and his wife are building a house and he felt like the contractor "screwed me out of $100,000.". He began seeing Dr. Candis Schatz in [place] and was diagnosed as being bipolar because he had significant mood swings anger and irritability. Since that he's been on a combination of Lamictal and Depakote which he thinks has helped. Because of his long-term difficulties with focus and concentration he was started on Vyvanse which she also thinks has helped.  The patient still has some difficulties with irritability but not like he used to. He and his wife don't get along but he admits that he was having an affair with an old girlfriend for the last few years and still talks to this woman periodically. His wife found out about it and is quite resentful. He also has significant memory problems and often loses his train of thought. He denies any head injuries. 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 He has numerous medical problems and has chronic pain from arthritis. Last year he also had knee surgery and is well as a small bowel obstruction.  The patient no longer uses drugs or alcohol. He is active in church and does a lot of things with his cousin. His energy is pretty good and he states he sleeps fairly well. He has never had psychiatric hospitalization or treatment for drug or alcohol abuse Elements:  Location:  Global. Quality:  Stable. Severity:  Moderate to severe. Timing:  Daily. Duration:  Several years. Context:  Conflicts with wife. Associated Signs/Symptoms: Depression Symptoms:  depressed mood, psychomotor agitation, difficulty concentrating, anxiety, (Hypo) Manic Symptoms:  Distractibility, Irritable Mood, Labiality of Mood,  Past Medical History:  Past Medical History  Diagnosis Date  . Asthma     ACTIVITY INDUCED  . Sinus drainage   . Arthritis   . GERD (gastroesophageal reflux  disease)   . Bipolar 1 disorder ([facility])   . Neck pain, chronic   . Cellulitis 05/2014    right side face  . 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 BPH (benign prostatic hyperplasia)   . Venous insufficiency   . 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  . Acid reflux   . Cerebral aneurysm     Past Surgical History  Procedure Laterality Date  . Nose surgery    . Total hip arthroplasty Left 01/03/2013    Procedure: TOTAL HIP ARTHROPLASTY ANTERIOR APPROACH;  Surgeon: Gearlean Alf, MD;  Location: [facility];  Service: Orthopedics;  Laterality: Left;  . Dental implant  09/26/2014    left lower dental implant  . Total knee arthroplasty Right 10/06/2014    Procedure: RIGHT TOTAL KNEE ARTHROPLASTY;  Surgeon: Gearlean Alf, MD;  Location: [facility];  Service: Orthopedics;  Laterality: Right;  . Radiology with anesthesia N/A 12/29/2014    Procedure: Embolization;  Surgeon: Consuella Lose, MD;  Location: [facility];  Service: Radiology;  Laterality: N/A;  . Cerebral stent    . Joint replacement Right     Knee  . Joint replacement Left     Hip   Family History:  Family History  Problem Relation Age of Onset  . Arrhythmia Mother     Has pacemaker  . Hypertension Mother   . Heart disease Mother   . Mental illness Mother   . Varicose Veins Mother   . Depression Mother   . Colon cancer Father   . Cancer Father     Prostate and Colon  . Diabetes Father   . Hypertension Father   . Depression Sister    Social History:   Social History   Social History  . Marital Status: Married    Spouse Name: N/A  . Number of Children: N/A  . Years of Education: N/A   Social History Main Topics  . Smoking status: Current Every Day Smoker -- 0.50 packs/day    Types: Cigarettes    Start date: 05/23/2015  . Smokeless tobacco: Former Systems developer     Comment: Stated "I smoke off and on"  . Alcohol Use: No     Comment: 10-12-15 per pt no  .  Drug Use: No     Comment: 10-12-15 per tp no  . Sexual Activity: Yes   Other Topics Concern  . None   Social History Narrative   Additional Social History: The patient grew up in [place] with both parents and younger sister. 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 He has been married to the same woman for over 2 years and has 2 grown daughters and 5 grandchildren. He denies any legal history but did use drugs and alcohol for quite a number of years in the past  Musculoskeletal: Strength & Muscle Tone: within normal limits Gait & Station: normal Patient leans: N/A  Psychiatric  Specialty Exam: HPI  Review of Systems  Eyes: Positive for blurred vision.  Musculoskeletal: Positive for back pain, joint pain and neck pain.  Psychiatric/Behavioral: Positive for depression. The patient is nervous/anxious.     Blood pressure 137/89, pulse 85, height 5' 11.5" (1.816 m), weight 205 lb (92.987 kg), SpO2 93 %.Body mass index is 28.2 kg/(m^2).  General Appearance: Casual and Fairly Groomed  Eye Contact:  Good  Speech:  Clear and Coherent  Volume:  Normal  Mood:  Irritable  Affect:  Constricted  Thought Process:  Circumstantial and Coherent  Orientation:  Full (Time, Place, and Person)  Thought Content:  Rumination  Suicidal Thoughts:  No  Homicidal Thoughts:  No  Memory:  Immediate;   Fair Recent;   Poor Remote;   Poor  Judgement:  Fair  Insight:  Lacking  Psychomotor Activity:  Normal  Concentration:  Fair  Recall:  Poor  Fund of Knowledge:Fair  Language: Good  Akathisia:  No  Handed:  Right  AIMS (if indicated):    Assets:  Communication Skills Desire for Improvement Resilience Social Support  ADL's:  Intact  Cognition: WNL  Sleep:  ok   Is the patient at risk to self?  No. Has the patient been a risk to self in the past 6 months?  No. Has the patient been a risk to self  within the distant past?  No. Is the patient a risk to others?  No. Has the patient been a risk to others in the past 6 months?  No. Has the patient been a risk to others within the distant past?  No.  Allergies:   Allergies  Allergen Reactions  . Aspirin Other (See Comments)    Do not take with depakote.   Marland Kitchen Penicillins Rash   Current Medications: Current Outpatient Prescriptions  Medication Sig Dispense Refill  . aspirin 325 MG tablet Take 325 mg by mouth daily.    Marland Kitchen dexlansoprazole (DEXILANT) 60 MG capsule Take 60 mg by mouth every morning.     . divalproex (DEPAKOTE ER) 500 MG 24 hr tablet Take 1 tablet (500 mg total) by mouth at bedtime. 30 tablet 2  . Dutasteride-Tamsulosin HCl (JALYN) 0.5-0.4 MG CAPS Take 1 capsule by mouth daily at 12 noon.     Marland Kitchen HYDROcodone-acetaminophen (NORCO) 7.5-325 MG per tablet Take 1-2 tablets by mouth every 6 (six) hours as needed for moderate pain.    Marland Kitchen ibuprofen (ADVIL,MOTRIN) 200 MG tablet Take 800 mg by mouth every 8 (eight) hours as needed for mild pain or moderate pain.    Marland Kitchen ipratropium (ATROVENT) 0.03 % nasal spray Place 2 sprays into both nostrils every 12 (twelve) hours as needed for rhinitis.    Marland Kitchen lamoTRIgine (LAMICTAL) 200 MG tablet Take 1 tablet (200 mg total) by mouth at bedtime. 30 tablet 2  . levalbuterol (XOPENEX HFA) 45 MCG/ACT inhaler Inhale 1-2 puffs into the lungs every 6 (six) hours as needed for wheezing.     Marland Kitchen lisdexamfetamine (VYVANSE) 70 MG capsule Take 1 capsule (70 mg total) by mouth every morning. 30 capsule 0  . montelukast (SINGULAIR) 10 MG tablet Take 10 mg by mouth every morning.     . Probiotic Product (PROBIOTIC DAILY PO) Take 1 capsule by mouth daily.    . promethazine (PHENERGAN) 25 MG tablet Take 25 mg by mouth every 6 (six) hours as needed for nausea or vomiting.    . RABEprazole (ACIPHEX) 20 MG tablet Take 20 mg by mouth every evening.     Marland Kitchen  tadalafil (CIALIS) 20 MG tablet Take 20 mg by mouth daily as needed for  erectile dysfunction.    Marland Kitchen testosterone cypionate (DEPOTESTOTERONE CYPIONATE) 200 MG/ML injection Inject 200 mg into the muscle every 14 (fourteen) days.      No current facility-administered medications for this visit.    Previous Psychotropic Medications: Yes   Substance Abuse History in the last 12 months:  No.  Consequences of Substance Abuse: NA  Medical Decision Making:  Established Problem, Stable/Improving (1), Review or order clinical lab tests (1), Review and summation of old records (2), Review or order medicine tests (1) and Review of Medication Regimen & Side Effects (2)  Treatment Plan Summary: Medication management   This patient is a 61 year old white male with a remote history of alcohol and substance abuse which is now in remission. He also has a presumed diagnosis of bipolar disorder but he claims the current medications have been helpful. We will need to check his Depakote level. For now however he will continue his current doses of Lamictal and Depakote. He will also continue Vyvanse for ADHD symptoms. He'll return in 4 weeks and I suggested he bring his wife so I can get a better understanding of what his behavior and affect is like at home    [place], [facility] 2/3/20173:08 PM

## 2015-10-16 ENCOUNTER — Encounter: Payer: Self-pay | Admitting: Vascular Surgery

## 2015-10-23 ENCOUNTER — Ambulatory Visit (INDEPENDENT_AMBULATORY_CARE_PROVIDER_SITE_OTHER): Payer: Commercial Managed Care - HMO | Admitting: Vascular Surgery

## 2015-10-23 ENCOUNTER — Encounter: Payer: Self-pay | Admitting: Vascular Surgery

## 2015-10-23 VITALS — BP 156/95 | HR 101 | Temp 97.8°F | Resp 18 | Ht 71.0 in | Wt 210.0 lb

## 2015-10-23 DIAGNOSIS — I83891 Varicose veins of right lower extremities with other complications: Secondary | ICD-10-CM

## 2015-10-23 NOTE — Progress Notes (Signed)
Filed Vitals:   10/23/15 1056 10/23/15 1108  BP: 152/89 156/95  Pulse: 103 101  Temp: 97.8 F (36.6 C)   TempSrc: Oral   Resp: 18   Height: 5\' 11"  (1.803 m)   Weight: 210 lb (95.255 kg)   SpO2: 97%

## 2015-10-23 NOTE — Progress Notes (Signed)
Subjective:     Patient ID: Philip Gates, male   DOB: [birth year], 60 y.o.   MRN: [id number]  HPI This 60 year old male returns to further discuss his painful varicosities in the right leg. Patient continues to have pain and swelling in the right leg which worsens as the day progresses. He has tried long-leg elastic compression stockings 20-30 millimeter gradient as well as elevation and ibuprofen but has had no improvement in his symptoms.  His symptoms are affecting his daily living and he would like treatment if possible. He has no history of DVT.   Past Medical History  Diagnosis Date  . Asthma     ACTIVITY INDUCED  . Sinus drainage   . Arthritis   . GERD (gastroesophageal reflux disease)   . Bipolar 1 disorder ([place])   . Neck pain, chronic   . Cellulitis 05/2014    right side face  . 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 BPH (benign prostatic hyperplasia)   . Venous insufficiency   . 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  . Acid reflux   . Cerebral aneurysm     Social History  Substance Use Topics  . Smoking status: Current Every Day Smoker -- 0.50 packs/day    Types: Cigarettes    Start date: 05/23/2015  . Smokeless tobacco: Former Systems developer     Comment: Stated "I smoke off and on"  . Alcohol Use: No     Comment: 10-12-15 per pt no    Family History  Problem Relation Age of Onset  . Arrhythmia Mother     Has pacemaker  . Hypertension Mother   . Heart disease Mother   . Mental illness Mother   . Varicose Veins Mother   . Depression Mother   . Colon cancer Father   . Cancer Father     Prostate and Colon  . Diabetes Father   . Hypertension Father   . Depression Sister     Allergies  Allergen Reactions  . Aspirin Other (See Comments)    Do not take with depakote.   Marland Kitchen Penicillins Rash     Current outpatient prescriptions:  .  aspirin 325 MG tablet, Take 325 mg by mouth daily., Disp: ,  Rfl:  .  dexlansoprazole (DEXILANT) 60 MG capsule, Take 60 mg by mouth every morning. , Disp: , Rfl:  .  divalproex (DEPAKOTE ER) 500 MG 24 hr tablet, Take 1 tablet (500 mg total) by mouth at bedtime., Disp: 30 tablet, Rfl: 2 .  Dutasteride-Tamsulosin HCl (JALYN) 0.5-0.4 MG CAPS, Take 1 capsule by mouth daily at 12 noon. , Disp: , Rfl:  .  HYDROcodone-acetaminophen (NORCO) 7.5-325 MG per tablet, Take 1-2 tablets by mouth every 6 (six) hours as needed for moderate pain., Disp: , Rfl:  .  ibuprofen (ADVIL,MOTRIN) 200 MG tablet, Take 800 mg by mouth every 8 (eight) hours as needed for mild pain or moderate pain., Disp: , Rfl:  .  ipratropium (ATROVENT) 0.03 % nasal spray, Place 2 sprays into both nostrils every 12 (twelve) hours as needed for rhinitis. Reported on 10/23/2015, Disp: , Rfl:  .  lamoTRIgine (LAMICTAL) 200 MG tablet, Take 1 tablet (200 mg total) by mouth at bedtime., Disp: 30 tablet, Rfl: 2 .  levalbuterol (XOPENEX HFA) 45 MCG/ACT inhaler, Inhale 1-2 puffs into the lungs every 6 (six) hours as needed for wheezing. , Disp: ,  Rfl:  .  lisdexamfetamine (VYVANSE) 70 MG capsule, Take 1 capsule (70 mg total) by mouth every morning., Disp: 30 capsule, Rfl: 0 .  LYRICA 75 MG capsule, 2 (two) times daily., Disp: , Rfl:  .  montelukast (SINGULAIR) 10 MG tablet, Take 10 mg by mouth every morning. , Disp: , Rfl:  .  Probiotic Product (PROBIOTIC DAILY PO), Take 1 capsule by mouth daily., Disp: , Rfl:  .  promethazine (PHENERGAN) 25 MG tablet, Take 25 mg by mouth every 6 (six) hours as needed for nausea or vomiting., Disp: , Rfl:  .  RABEprazole (ACIPHEX) 20 MG tablet, Take 20 mg by mouth every evening. , Disp: , Rfl:  .  tadalafil (CIALIS) 20 MG tablet, Take 20 mg by mouth daily as needed for erectile dysfunction., Disp: , Rfl:  .  testosterone cypionate (DEPOTESTOTERONE CYPIONATE) 200 MG/ML injection, Inject 200 mg into the muscle every 14 (fourteen) days. , Disp: , Rfl:   Filed Vitals:   10/23/15  1056 10/23/15 1108  BP: 152/89 156/95  Pulse: 103 101  Temp: 97.8 F (36.6 C)   TempSrc: Oral   Resp: 18   Height: 5\' 11"  (1.803 m)   Weight: 210 lb (95.255 kg)   SpO2: 97%     Body mass index is 29.3 kg/(m^2).           Review of Systems Denies chest pain, dyspnea on exertion, PND, orthopnea. Has history of bilateral neuropathy. Has had right knee replacement and continues to have discomfort in the right leg.     Objective:   Physical Exam BP 156/95 mmHg  Pulse 101  Temp(Src) 97.8 F (36.6 C) (Oral)  Resp 18  Ht 5\' 11"  (1.803 m)  Wt 210 lb (95.255 kg)  BMI 29.30 kg/m2  SpO2 97%   Gen. Well-developed well-nourished male in no apparent distress alert and oriented 3 Lungs no rhonchi or wheezing Right leg with 1+ edema. Diffuse pattern of extensive reticular veins lower third of leg extending into right medial malleolar area and on the dorsum of foot. A few bulging varicosities in the medial calf and thigh. Early hyperpigmentation lower third right leg. 3+ dorsalis pedis pulse palpable.    formal venous duplex exam at last visit revealed gross reflux right great saphenous vein in a  Large caliber vein. There is no DVT on the right.     Assessment:      pain and swelling right leg being exacerbated by gross reflux right great saphenous vein with painful reticular veins lower third right leg. Symptoms have been resistant to conservative measures including long-leg elastic compression stockings 20-30 millimeter gradient, elevation, and ibuprofen. Symptoms are affecting patient's daily living and prevent him from being on his feet and being active for long periods of time.   Patient needs laser ablation right great saphenous vein +1 course of sclerotherapy to hopefully improve his symptomatology. We will proceed with precertification to perform this in the near future.     Plan:

## 2015-10-31 ENCOUNTER — Other Ambulatory Visit: Payer: Self-pay | Admitting: *Deleted

## 2015-10-31 DIAGNOSIS — Z0279 Encounter for issue of other medical certificate: Secondary | ICD-10-CM | POA: Diagnosis not present

## 2015-10-31 DIAGNOSIS — I83811 Varicose veins of right lower extremities with pain: Secondary | ICD-10-CM

## 2015-11-08 ENCOUNTER — Ambulatory Visit (INDEPENDENT_AMBULATORY_CARE_PROVIDER_SITE_OTHER): Payer: Commercial Managed Care - HMO | Admitting: Psychiatry

## 2015-11-08 ENCOUNTER — Encounter (HOSPITAL_COMMUNITY): Payer: Self-pay | Admitting: Psychiatry

## 2015-11-08 VITALS — BP 145/64 | HR 92 | Ht 71.0 in | Wt 208.0 lb

## 2015-11-08 DIAGNOSIS — F3162 Bipolar disorder, current episode mixed, moderate: Secondary | ICD-10-CM | POA: Diagnosis not present

## 2015-11-08 DIAGNOSIS — F902 Attention-deficit hyperactivity disorder, combined type: Secondary | ICD-10-CM | POA: Diagnosis not present

## 2015-11-08 MED ORDER — LISDEXAMFETAMINE DIMESYLATE 70 MG PO CAPS: 70.0000 mg | ORAL_CAPSULE | Freq: Every morning | ORAL | Status: DC

## 2015-11-08 MED ORDER — LISDEXAMFETAMINE DIMESYLATE 70 MG PO CAPS: 70.0000 mg | ORAL_CAPSULE | Freq: Every day | ORAL | Status: DC

## 2015-11-08 MED ORDER — DIVALPROEX SODIUM ER 500 MG PO TB24: 500.0000 mg | ORAL_TABLET | Freq: Every day | ORAL | Status: DC

## 2015-11-08 MED ORDER — LAMOTRIGINE 200 MG PO TABS: 200.0000 mg | ORAL_TABLET | Freq: Every day | ORAL | Status: DC

## 2015-11-08 NOTE — Progress Notes (Signed)
Patient ID: Philip Gates, male   DOB: 12/14/1955, 60 y.o.   MRN: [id number]  Psychiatric Initial Adult Assessment   Patient Identification: Philip Gates MRN:  [id number] Date of Evaluation:  11/08/2015 Referral Source: Dr. Nevada Crane Chief Complaint:   Chief Complaint    Manic Behavior; Depression; ADHD; Follow-up     Visit Diagnosis:    ICD-9-CM ICD-10-CM   1. Bipolar 1 disorder, mixed, moderate (HCC) 296.62 F31.62   2. Attention deficit hyperactivity disorder (ADHD), combined type 314.01 F90.2    Diagnosis:   Patient Active Problem List   Diagnosis Date Noted  . Bipolar 1 disorder, mixed, moderate ([place]) [F31.62] 10/12/2015  . Attention deficit hyperactivity disorder (ADHD) [F90.9] 10/12/2015  . Chronic venous insufficiency [I87.2] 06/25/2015  . SBO (small bowel obstruction) ([place]) [K56.69] 01/16/2015  . Cerebral aneurysm [I67.1] 12/29/2014  . OA (osteoarthritis) of knee [M17.9] 10/06/2014  . Facial cellulitis [id number] 05/31/2014  . Asthma [J45.909]   . GERD (gastroesophageal reflux disease) [K21.9]   . Bipolar 1 disorder ([place]) [F31.9]   . OA (osteoarthritis) of hip [M16.9] 01/03/2013   History of Present Illness:  This patient is a 60 year old married white male lives with his wife in [place]. 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  The patient was referred by his primary physician, Dr. Wende Neighbors, for further assessment and treatment of bipolar disorder and ADHD.  The patient is a vague historian and he tends to ramble a lot. Apparently as a child he was very unfocused and hyperactive. He is always had difficulty completing tasks. He was never diagnosed with ADHD in childhood and he was able to complete high school and 1 year of machine shop training. He has worked in numerous jobs in Film/video editor.  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  He quit all this about 10 years ago. About 7 years ago he became increasingly angry and moody and irritable. Some of it started when he and his wife are building a house and he felt like the contractor "screwed me out of $100,000.". He began seeing Dr. Candis Schatz in [place] and was diagnosed as being bipolar because he had significant mood swings anger and irritability. Since that he's been on a combination of Lamictal and Depakote which he thinks has helped. Because of his long-term difficulties with focus and concentration he was started on Vyvanse which she also thinks has helped.  The patient still has some difficulties with irritability but not like he used to. He and his wife don't get along but he admits that he was having an affair with an old girlfriend for the last few years and still talks to this woman periodically. His wife found out about it and is quite resentful. He also has significant memory problems and often loses his train of thought. He denies any head injuries. 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 He has numerous medical problems and has chronic pain from arthritis. Last year he also had knee surgery and is well as a small bowel obstruction.  The patient no longer uses drugs or alcohol. He is active in church and does a lot of things with his cousin. His energy is pretty good and he states he sleeps fairly well. He has never had psychiatric hospitalization or treatment for drug or alcohol abuse  The patient returns  after 4 weeks. He had his Depakote level done at Dr. Juel Burrow office and it is 37.6 which is low. Nevertheless he feels pretty good on it and states that his mood is stable. He and his wife are still having their arguments. He states that he is still "just talking" to the other girlfriend because she is going to a stressful time because her husband had an affair. He sleeping about the same as always which isn't too much but this is how he has been off  his life. He denies having severe mood swings anger irritability. He is staying well focused on Vyvanse Elements:  Location:  Global. Quality:  Stable. Severity:  Moderate to severe. Timing:  Daily. Duration:  Several years. Context:  Conflicts with wife. Associated Signs/Symptoms: Depression Symptoms:  depressed mood, psychomotor agitation, difficulty concentrating, anxiety, (Hypo) Manic Symptoms:  Distractibility, Irritable Mood, Labiality of Mood,  Past Medical History:  Past Medical History  Diagnosis Date  . Asthma     ACTIVITY INDUCED  . Sinus drainage   . Arthritis   . GERD (gastroesophageal reflux disease)   . Bipolar 1 disorder ([place])   . Neck pain, chronic   . Cellulitis 05/2014    right side face  . 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 BPH (benign prostatic hyperplasia)   . Venous insufficiency   . 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  . Acid reflux   . Cerebral aneurysm     Past Surgical History  Procedure Laterality Date  . Nose surgery    . Total hip arthroplasty Left 01/03/2013    Procedure: TOTAL HIP ARTHROPLASTY ANTERIOR APPROACH;  Surgeon: Gearlean Alf, MD;  Location: [facility];  Service: Orthopedics;  Laterality: Left;  . Dental implant  09/26/2014    left lower dental implant  . Total knee arthroplasty Right 10/06/2014    Procedure: RIGHT TOTAL KNEE ARTHROPLASTY;  Surgeon: Gearlean Alf, MD;  Location: [facility];  Service: Orthopedics;  Laterality: Right;  . Radiology with anesthesia N/A 12/29/2014    Procedure: Embolization;  Surgeon: Consuella Lose, MD;  Location: [facility];  Service: Radiology;  Laterality: N/A;  . Cerebral stent    . Joint replacement Right     Knee  . Joint replacement Left     Hip  . Subclavian angiogram  Nov. 8, 2016  . Eye surgery Left Jan. 16, 2017    Cataract   Family History:  Family History  Problem Relation Age of Onset  . Arrhythmia  Mother     Has pacemaker  . Hypertension Mother   . Heart disease Mother   . Mental illness Mother   . Varicose Veins Mother   . Depression Mother   . Colon cancer Father   . Cancer Father     Prostate and Colon  . Diabetes Father   . Hypertension Father   . Depression Sister    Social History:   Social History   Social History  . Marital Status: Married    Spouse Name: N/A  . Number of Children: N/A  . Years of Education: N/A   Social History Main Topics  . Smoking status: Current Every Day Smoker -- 0.50 packs/day    Types: Cigarettes    Start date: 05/23/2015  . Smokeless tobacco: Former Systems developer     Comment: Stated "I smoke off and on"  . Alcohol Use:  No     Comment: 10-12-15 per pt no  . Drug Use: No     Comment: 10-12-15 per tp no  . Sexual Activity: Yes   Other Topics Concern  . None   Social History Narrative   Additional Social History: The patient grew up in [place] with both parents and younger sister. 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 He has been married to the same woman for over 86 years and has 2 grown daughters and 5 grandchildren. He denies any legal history but did use drugs and alcohol for quite a number of years in the past  Musculoskeletal: Strength & Muscle Tone: within normal limits Gait & Station: normal Patient leans: N/A  Psychiatric Specialty Exam: Depression         Review of Systems  Eyes: Positive for blurred vision.  Musculoskeletal: Positive for back pain, joint pain and neck pain.  Psychiatric/Behavioral: Positive for depression. The patient is nervous/anxious.     Blood pressure 145/64, pulse 92, height 5\' 11"  (1.803 m), weight 208 lb (94.348 kg), SpO2 97 %.Body mass index is 29.02 kg/(m^2).  General Appearance: Casual and Fairly Groomed  Eye Contact:  Good  Speech:  Clear and Coherent  Volume:  Normal  Mood:  Fairly good    Affect:  Calm   Thought Process:  Circumstantial and Coherent  Orientation:  Full (Time, Place, and Person)  Thought Content:  Rumination  Suicidal Thoughts:  No  Homicidal Thoughts:  No  Memory:  Immediate;   Fair Recent;   Poor Remote;   Poor  Judgement:  Fair  Insight:  Lacking  Psychomotor Activity:  Normal  Concentration:  Fair  Recall:  Poor  Fund of Knowledge:Fair  Language: Good  Akathisia:  No  Handed:  Right  AIMS (if indicated):    Assets:  Communication Skills Desire for Improvement Resilience Social Support  ADL's:  Intact  Cognition: WNL  Sleep:  ok   Is the patient at risk to self?  No. Has the patient been a risk to self in the past 6 months?  No. Has the patient been a risk to self within the distant past?  No. Is the patient a risk to others?  No. Has the patient been a risk to others in the past 6 months?  No. Has the patient been a risk to others within the distant past?  No.  Allergies:   Allergies  Allergen Reactions  . Aspirin Other (See Comments)    Do not take with depakote.   Marland Kitchen Penicillins Rash   Current Medications: Current Outpatient Prescriptions  Medication Sig Dispense Refill  . aspirin 325 MG tablet Take 325 mg by mouth daily.    Marland Kitchen dexlansoprazole (DEXILANT) 60 MG capsule Take 60 mg by mouth every morning.     . divalproex (DEPAKOTE ER) 500 MG 24 hr tablet Take 1 tablet (500 mg total) by mouth at bedtime. 30 tablet 2  . Dutasteride-Tamsulosin HCl (JALYN) 0.5-0.4 MG CAPS Take 1 capsule by mouth daily at 12 noon.     Marland Kitchen HYDROcodone-acetaminophen (NORCO) 7.5-325 MG per tablet Take 1-2 tablets by mouth every 4 (four) hours as needed for moderate pain.     Marland Kitchen ibuprofen (ADVIL,MOTRIN) 200 MG tablet Take 800 mg by mouth every 8 (eight) hours as needed for mild pain or moderate pain.    Marland Kitchen ipratropium (ATROVENT) 0.03 %  nasal spray Place 2 sprays into both nostrils every 12 (twelve) hours as needed for rhinitis. Reported on 10/23/2015    .  lamoTRIgine (LAMICTAL) 200 MG tablet Take 1 tablet (200 mg total) by mouth at bedtime. 30 tablet 2  . levalbuterol (XOPENEX HFA) 45 MCG/ACT inhaler Inhale 1-2 puffs into the lungs every 6 (six) hours as needed for wheezing.     Marland Kitchen lisdexamfetamine (VYVANSE) 70 MG capsule Take 1 capsule (70 mg total) by mouth every morning. 30 capsule 0  . LYRICA 75 MG capsule 2 (two) times daily.    . montelukast (SINGULAIR) 10 MG tablet Take 10 mg by mouth every morning.     . Probiotic Product (PROBIOTIC DAILY PO) Take 1 capsule by mouth daily.    . promethazine (PHENERGAN) 25 MG tablet Take 25 mg by mouth every 6 (six) hours as needed for nausea or vomiting.    . RABEprazole (ACIPHEX) 20 MG tablet Take 20 mg by mouth every evening.     . tadalafil (CIALIS) 20 MG tablet Take 20 mg by mouth daily as needed for erectile dysfunction.    Marland Kitchen testosterone cypionate (DEPOTESTOTERONE CYPIONATE) 200 MG/ML injection Inject 200 mg into the muscle every 14 (fourteen) days.     Marland Kitchen lisdexamfetamine (VYVANSE) 70 MG capsule Take 1 capsule (70 mg total) by mouth daily. 30 capsule 0  . lisdexamfetamine (VYVANSE) 70 MG capsule Take 1 capsule (70 mg total) by mouth daily. 30 capsule 0   No current facility-administered medications for this visit.    Previous Psychotropic Medications: Yes   Substance Abuse History in the last 12 months:  No.  Consequences of Substance Abuse: NA  Medical Decision Making:  Established Problem, Stable/Improving (1), Review or order clinical lab tests (1), Review and summation of old records (2), Review or order medicine tests (1) and Review of Medication Regimen & Side Effects (2)  Treatment Plan Summary: Medication management   The patient will continue Vyvanse 70 mg every morning for ADHD, Lamictal 200 mg daily and Depakote ER 500 mg daily for mood stabilization. He'll return in 3 months or call sooner if needed    [place], [facility] 3/2/20172:14 PM

## 2015-11-30 ENCOUNTER — Encounter: Payer: Self-pay | Admitting: Vascular Surgery

## 2015-12-07 ENCOUNTER — Encounter: Payer: Self-pay | Admitting: Vascular Surgery

## 2015-12-10 ENCOUNTER — Ambulatory Visit (INDEPENDENT_AMBULATORY_CARE_PROVIDER_SITE_OTHER): Payer: Commercial Managed Care - HMO | Admitting: Vascular Surgery

## 2015-12-10 ENCOUNTER — Encounter: Payer: Self-pay | Admitting: Vascular Surgery

## 2015-12-10 VITALS — BP 138/83 | HR 102 | Temp 96.7°F | Resp 16 | Ht 71.75 in | Wt 205.0 lb

## 2015-12-10 DIAGNOSIS — I83891 Varicose veins of right lower extremities with other complications: Secondary | ICD-10-CM | POA: Diagnosis not present

## 2015-12-10 NOTE — Progress Notes (Signed)
Subjective:     Patient ID: Philip Gates, male   DOB: [birth year], 60 y.o.   MRN: [id number]  HPI This 60 year old male had laser ablation of the right great saphenous vein performed under local tumescent anesthesia for gross reflux with pain and swelling. He tolerated the procedure well. A total of about 2500 J of energy was utilized.   Review of Systems     Objective:   Physical Exam BP 138/83 mmHg  Pulse 102  Temp(Src) 96.7 F (35.9 C)  Resp 16  Ht 5' 11.75" (1.822 m)  Wt 205 lb (92.987 kg)  BMI 28.01 kg/m2  SpO2 97%       Assessment:      well-tolerated laser ablation right great saphenous vein performed under local tumescent anesthesia     Plan:      return in 1 week for venous duplex exam to confirm closure right great saphenous vein

## 2015-12-10 NOTE — Progress Notes (Signed)
Laser Ablation Procedure    Date: 12/10/2015   Philip Gates DOB:[birth year]  Consent signed: Yes    Surgeon:  Dr. Nelda Severe. Kellie Simmering  Procedure: Laser Ablation: right Greater Saphenous Vein  BP 138/83 mmHg  Pulse 102  Temp(Src) 96.7 F (35.9 C)  Resp 16  Ht 5' 11.75" (1.822 m)  Wt 205 lb (92.987 kg)  BMI 28.01 kg/m2  SpO2 97%  Tumescent Anesthesia: 400 cc 0.9% NaCl with 50 cc Lidocaine HCL with 1% Epi and 15 cc 8.4% NaHCO3  Local Anesthesia: 4 cc Lidocaine HCL and NaHCO3 (ratio 2:1)  Pulsed Mode: 15 watts, 531ms delay, 1.0 duration  Total Energy: 2574             Total Pulses: 172               Total Time: 2:52    Patient tolerated procedure well  Notes:   Description of Procedure:  After marking the course of the secondary varicosities, the patient was placed on the operating table in the supine position, and the right leg was prepped and draped in sterile fashion.   Local anesthetic was administered and under ultrasound guidance the saphenous vein was accessed with a micro needle and guide wire; then the mirco puncture sheath was placed.  A guide wire was inserted saphenofemoral junction , followed by a 5 french sheath.  The position of the sheath and then the laser fiber below the junction was confirmed using the ultrasound.  Tumescent anesthesia was administered along the course of the saphenous vein using ultrasound guidance. The patient was placed in Trendelenburg position and protective laser glasses were placed on patient and staff, and the laser was fired at 15 watts continuous mode advancing 1-17mm/second for a total of 2574 joules.   Steri strips were applied to the stab wounds and ABD pads and thigh high compression stockings were applied.  Ace wrap bandages were applied over the phlebectomy sites and at the top of the saphenofemoral junction. Blood loss was less than 15 cc.  The patient ambulated out of the operating room having tolerated the procedure well.

## 2015-12-10 NOTE — Progress Notes (Signed)
Filed Vitals:   12/10/15 1053 12/10/15 1054  BP: 146/91 138/83  Pulse: 100 102  Temp: 96.7 F (35.9 C)   Resp: 16   Height: 5' 11.75" (1.822 m)   Weight: 205 lb (92.987 kg)   SpO2: 97%

## 2015-12-11 ENCOUNTER — Telehealth: Payer: Self-pay | Admitting: *Deleted

## 2015-12-11 NOTE — Telephone Encounter (Signed)
Pt doing well. Using ice at top of leg. Following all instructions.

## 2015-12-17 ENCOUNTER — Encounter: Payer: Self-pay | Admitting: Vascular Surgery

## 2015-12-17 ENCOUNTER — Ambulatory Visit (INDEPENDENT_AMBULATORY_CARE_PROVIDER_SITE_OTHER): Payer: Commercial Managed Care - HMO | Admitting: Vascular Surgery

## 2015-12-17 ENCOUNTER — Ambulatory Visit (HOSPITAL_COMMUNITY)
Admission: RE | Admit: 2015-12-17 | Discharge: 2015-12-17 | Disposition: A | Discharge status: Home / Self Care | Payer: Commercial Managed Care - HMO | Source: Ambulatory Visit | Attending: Vascular Surgery | Admitting: Vascular Surgery

## 2015-12-17 VITALS — BP 143/90 | HR 99 | Temp 97.0°F | Resp 18 | Ht 71.75 in | Wt 205.8 lb

## 2015-12-17 DIAGNOSIS — K219 Gastro-esophageal reflux disease without esophagitis: Secondary | ICD-10-CM | POA: Insufficient documentation

## 2015-12-17 DIAGNOSIS — Z9889 Other specified postprocedural states: Secondary | ICD-10-CM | POA: Diagnosis present

## 2015-12-17 DIAGNOSIS — I83811 Varicose veins of right lower extremities with pain: Secondary | ICD-10-CM | POA: Insufficient documentation

## 2015-12-17 DIAGNOSIS — I83891 Varicose veins of right lower extremities with other complications: Secondary | ICD-10-CM

## 2015-12-17 NOTE — Progress Notes (Signed)
Subjective:     Patient ID: Philip Gates, male   DOB: 11/08/1955, 60 y.o.   MRN: [id number]  HPI This 60 year old male returns 1 week post-laser ablation right great saphenous vein from proximal calf to near the saphenofemoral junction for gross reflux. Patient had been experiencing pain and swelling also had extensive network of reticular veins and lower third of the leg. He states that the swelling has decreased particularly around the knee area. He has worn his elastic compression stockings and take an ibuprofen as instructed.   Past Medical History  Diagnosis Date  . Asthma     ACTIVITY INDUCED  . Sinus drainage   . Arthritis   . GERD (gastroesophageal reflux disease)   . Bipolar 1 disorder ([place])   . Neck pain, chronic   . Cellulitis 05/2014    right side face  . 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 BPH (benign prostatic hyperplasia)   . Venous insufficiency   . 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  . Acid reflux   . Cerebral aneurysm     Social History  Substance Use Topics  . Smoking status: Current Every Day Smoker -- 0.50 packs/day    Types: Cigarettes    Start date: 05/23/2015  . Smokeless tobacco: Former Systems developer     Comment: Stated "I smoke off and on"  . Alcohol Use: No     Comment: 10-12-15 per pt no    Family History  Problem Relation Age of Onset  . Arrhythmia Mother     Has pacemaker  . Hypertension Mother   . Heart disease Mother   . Mental illness Mother   . Varicose Veins Mother   . Depression Mother   . Colon cancer Father   . Cancer Father     Prostate and Colon  . Diabetes Father   . Hypertension Father   . Depression Sister     Allergies  Allergen Reactions  . Aspirin Other (See Comments)    Do not take with depakote.   Marland Kitchen Penicillins Rash     Current outpatient prescriptions:  .  aspirin 325 MG tablet, Take 325 mg by mouth daily., Disp: , Rfl:  .   dexlansoprazole (DEXILANT) 60 MG capsule, Take 60 mg by mouth every morning. , Disp: , Rfl:  .  divalproex (DEPAKOTE ER) 500 MG 24 hr tablet, Take 1 tablet (500 mg total) by mouth at bedtime., Disp: 30 tablet, Rfl: 2 .  Dutasteride-Tamsulosin HCl (JALYN) 0.5-0.4 MG CAPS, Take 1 capsule by mouth daily at 12 noon. , Disp: , Rfl:  .  HYDROcodone-acetaminophen (NORCO) 7.5-325 MG per tablet, Take 1-2 tablets by mouth every 4 (four) hours as needed for moderate pain. , Disp: , Rfl:  .  ibuprofen (ADVIL,MOTRIN) 200 MG tablet, Take 800 mg by mouth every 8 (eight) hours as needed for mild pain or moderate pain., Disp: , Rfl:  .  ipratropium (ATROVENT) 0.03 % nasal spray, Place 2 sprays into both nostrils every 12 (twelve) hours as needed for rhinitis. Reported on 10/23/2015, Disp: , Rfl:  .  lamoTRIgine (LAMICTAL) 200 MG tablet, Take 1 tablet (200 mg total) by mouth at bedtime., Disp: 30 tablet, Rfl: 2 .  levalbuterol (XOPENEX HFA) 45 MCG/ACT inhaler, Inhale 1-2 puffs into the lungs every 6 (six) hours as needed for wheezing. , Disp: , Rfl:  .  lisdexamfetamine (VYVANSE) 70  MG capsule, Take 1 capsule (70 mg total) by mouth every morning., Disp: 30 capsule, Rfl: 0 .  lisdexamfetamine (VYVANSE) 70 MG capsule, Take 1 capsule (70 mg total) by mouth daily., Disp: 30 capsule, Rfl: 0 .  lisdexamfetamine (VYVANSE) 70 MG capsule, Take 1 capsule (70 mg total) by mouth daily., Disp: 30 capsule, Rfl: 0 .  LYRICA 75 MG capsule, 2 (two) times daily., Disp: , Rfl:  .  montelukast (SINGULAIR) 10 MG tablet, Take 10 mg by mouth every morning. , Disp: , Rfl:  .  Probiotic Product (PROBIOTIC DAILY PO), Take 1 capsule by mouth daily., Disp: , Rfl:  .  promethazine (PHENERGAN) 25 MG tablet, Take 25 mg by mouth every 6 (six) hours as needed for nausea or vomiting., Disp: , Rfl:  .  RABEprazole (ACIPHEX) 20 MG tablet, Take 20 mg by mouth every evening. , Disp: , Rfl:  .  tadalafil (CIALIS) 20 MG tablet, Take 20 mg by mouth daily as  needed for erectile dysfunction., Disp: , Rfl:  .  testosterone cypionate (DEPOTESTOTERONE CYPIONATE) 200 MG/ML injection, Inject 200 mg into the muscle every 14 (fourteen) days. , Disp: , Rfl:   Filed Vitals:   12/17/15 0947 12/17/15 0950  BP: 140/92 143/90  Pulse: 99   Temp: 97 F (36.1 C)   TempSrc: Oral   Resp: 18   Height: 5' 11.75" (1.822 m)   Weight: 205 lb 12.8 oz (93.35 kg)   SpO2: 99%     Body mass index is 28.12 kg/(m^2).           Review of Systems Denies chest pain, dyspnea on exertion, PND, orthopnea, hemoptysis, claudication     Objective:   Physical Exam BP 143/90 mmHg  Pulse 99  Temp(Src) 97 F (36.1 C) (Oral)  Resp 18  Ht 5' 11.75" (1.822 m)  Wt 205 lb 12.8 oz (93.35 kg)  BMI 28.12 kg/m2  SpO2 99%   Gen. Well-developed well-nourished male no apparent distress alert and oriented 3 Lungs no rhonchi or wheezing Cardiovascular regular rhythm no murmurs Right leg with mild discomfort to deep palpation of the great saphenous vein from the distal thigh to near the saphenofemoral junction. No distal edema noted. 3+ dorsalis pedis pulse palpable. Extensive network of reticular veins lower third of leg. No ulceration noted.   today I ordered a venous duplex exam of the right leg which I reviewed and interpreted. There is no DVT. There is total closure of the right great saphenous vein from the distal knee region to near the saphenofemoral junction.     Assessment:      successful laser ablation right great saphenous vein for pain and swelling and extensive reticular vein network     Plan:      patient return May 10  For 1 course of sclerotherapy to complete his treatment regimen

## 2016-01-11 ENCOUNTER — Encounter: Payer: Self-pay | Admitting: *Deleted

## 2016-01-11 ENCOUNTER — Other Ambulatory Visit (HOSPITAL_COMMUNITY): Payer: Self-pay | Admitting: Neurosurgery

## 2016-01-11 DIAGNOSIS — I671 Cerebral aneurysm, nonruptured: Secondary | ICD-10-CM

## 2016-01-16 ENCOUNTER — Ambulatory Visit (INDEPENDENT_AMBULATORY_CARE_PROVIDER_SITE_OTHER): Payer: Commercial Managed Care - HMO | Admitting: *Deleted

## 2016-01-16 DIAGNOSIS — I83891 Varicose veins of right lower extremities with other complications: Secondary | ICD-10-CM

## 2016-01-16 NOTE — Progress Notes (Signed)
X=.3% Sotradecol administered with a 27g butterfly.  Patient received a total of 18cc.  Treated all areas of concern on the right leg where the laser ablation was performed. Easy access. Tol well. Follow prn.   Compression stockings applied: Yes.

## 2016-01-17 ENCOUNTER — Encounter: Payer: Self-pay | Admitting: Vascular Surgery

## 2016-01-29 ENCOUNTER — Ambulatory Visit (INDEPENDENT_AMBULATORY_CARE_PROVIDER_SITE_OTHER): Payer: Commercial Managed Care - HMO | Admitting: *Deleted

## 2016-01-29 DIAGNOSIS — I83891 Varicose veins of right lower extremities with other complications: Secondary | ICD-10-CM

## 2016-01-29 NOTE — Progress Notes (Signed)
Pt walked in concerned about a sclerotherapy insertion site he scratched by accident. It had bled a little and he wanted to be sure he was ok. I cleaned the insertion site which has scabbed over. His tx was 01/16/16. All areas are resolving as expected. Injected veins are darker and hard if raised. Asked him to continue wearing the stocking during the day through Thursday. Pt reassurred. Will follow prn.

## 2016-02-08 ENCOUNTER — Ambulatory Visit (INDEPENDENT_AMBULATORY_CARE_PROVIDER_SITE_OTHER): Payer: Commercial Managed Care - HMO | Admitting: Psychiatry

## 2016-02-08 ENCOUNTER — Encounter (HOSPITAL_COMMUNITY): Payer: Self-pay | Admitting: Psychiatry

## 2016-02-08 VITALS — BP 147/94 | HR 103 | Ht 71.75 in | Wt 209.0 lb

## 2016-02-08 DIAGNOSIS — F3162 Bipolar disorder, current episode mixed, moderate: Secondary | ICD-10-CM | POA: Diagnosis not present

## 2016-02-08 DIAGNOSIS — F902 Attention-deficit hyperactivity disorder, combined type: Secondary | ICD-10-CM

## 2016-02-08 MED ORDER — LISDEXAMFETAMINE DIMESYLATE 70 MG PO CAPS: 70.0000 mg | ORAL_CAPSULE | Freq: Every day | ORAL | Status: DC

## 2016-02-08 MED ORDER — LAMOTRIGINE 200 MG PO TABS: 200.0000 mg | ORAL_TABLET | Freq: Every day | ORAL | Status: DC

## 2016-02-08 MED ORDER — DIVALPROEX SODIUM ER 500 MG PO TB24: 500.0000 mg | ORAL_TABLET | Freq: Every day | ORAL | Status: DC

## 2016-02-08 MED ORDER — LISDEXAMFETAMINE DIMESYLATE 70 MG PO CAPS: 70.0000 mg | ORAL_CAPSULE | Freq: Every morning | ORAL | Status: DC

## 2016-02-08 NOTE — Progress Notes (Signed)
Patient ID: Philip Gates, male   DOB: 20-Mar-1956, 60 y.o.   MRN: [id number] Patient ID: Philip Gates, male   DOB: [birth year], 60 y.o.   MRN: [id number]  Psychiatric Initial Adult Assessment   Patient Identification: Philip Gates MRN:  [id number] Date of Evaluation:  02/08/2016 Referral Source: Dr. Nevada Crane Chief Complaint:   Chief Complaint    Manic Behavior; Depression; ADHD; Follow-up     Visit Diagnosis:  No diagnosis found. Diagnosis:   Patient Active Problem List   Diagnosis Date Noted  . Varicose veins of right lower extremity with complications [id number]  . Bipolar 1 disorder, mixed, moderate ([place]) [F31.62] 10/12/2015  . Attention deficit hyperactivity disorder (ADHD) [F90.9] 10/12/2015  . Chronic venous insufficiency [I87.2] 06/25/2015  . SBO (small bowel obstruction) ([place]) [K56.69] 01/16/2015  . Cerebral aneurysm [I67.1] 12/29/2014  . OA (osteoarthritis) of knee [M17.9] 10/06/2014  . Facial cellulitis [id number] 05/31/2014  . Asthma [J45.909]   . GERD (gastroesophageal reflux disease) [K21.9]   . Bipolar 1 disorder ([place]) [F31.9]   . OA (osteoarthritis) of hip [M16.9] 01/03/2013   History of Present Illness:  This patient is a 60 year old married white male lives with his wife in [place]. 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  The patient was referred by his primary physician, Dr. Wende Neighbors, for further assessment and treatment of bipolar disorder and ADHD.  The patient is a vague historian and he tends to ramble a lot. Apparently as a child he was very unfocused and hyperactive. He is always had difficulty completing tasks. He was never diagnosed with ADHD in childhood and he was able to complete high school and 1 year of machine shop training. He has worked in numerous jobs in Film/video editor.  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 He quit all this about 10 years ago. About 7 years ago he became increasingly angry and moody and irritable. Some of it started when he and his wife are building a house and he felt like the contractor "screwed me out of $100,000.". He began seeing Dr. Candis Schatz in [place] and was diagnosed as being bipolar because he had significant mood swings anger and irritability. Since that he's been on a combination of Lamictal and Depakote which he thinks has helped. Because of his long-term difficulties with focus and concentration he was started on Vyvanse which she also thinks has helped.  The patient still has some difficulties with irritability but not like he used to. He and his wife don't get along but he admits that he was having an affair with an old girlfriend for the last few years and still talks to this woman periodically. His wife found out about it and is quite resentful. He also has significant memory problems and often loses his train of thought. He denies any head injuries. 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 He has numerous medical problems and has chronic pain from arthritis. Last year he also had knee surgery and is well as a small bowel obstruction.  The patient no longer uses drugs or alcohol. He is active in church and does a lot of things with his cousin. His energy is pretty good and he states he sleeps fairly well. He has never had psychiatric hospitalization or treatment for drug or alcohol abuse  The patient  returns after 3 months. He states that his life has fallen apart at home. He continued to have an affair with another woman. His wife and children found out more information about this and his wife took an overdose of Xanax in a suicide attempt. She ended up in the behavioral health hospital couple of weeks ago because of this. He states he wants to keep the marriage together but his wife is very difficult to live with. They have built a life  together including property 2 grown children and grandchildren and he doesn't want to lose it. Yet he gets along a lot better with the other woman. He states that his mood is generally good and he things the medication still helpful but I strongly suggested he get into counseling here to figure out what he wants to do next Elements:  Location:  Global. Quality:  Stable. Severity:  Moderate to severe. Timing:  Daily. Duration:  Several years. Context:  Conflicts with wife. Associated Signs/Symptoms: Depression Symptoms:  depressed mood, psychomotor agitation, difficulty concentrating, anxiety, (Hypo) Manic Symptoms:  Distractibility, Irritable Mood, Labiality of Mood,  Past Medical History:  Past Medical History  Diagnosis Date  . Asthma     ACTIVITY INDUCED  . Sinus drainage   . Arthritis   . GERD (gastroesophageal reflux disease)   . Bipolar 1 disorder ([place])   . Neck pain, chronic   . Cellulitis 05/2014    right side face  . 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 BPH (benign prostatic hyperplasia)   . Venous insufficiency   . 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  . Acid reflux   . Cerebral aneurysm     Past Surgical History  Procedure Laterality Date  . Nose surgery    . Total hip arthroplasty Left 01/03/2013    Procedure: TOTAL HIP ARTHROPLASTY ANTERIOR APPROACH;  Surgeon: Gearlean Alf, MD;  Location: [facility];  Service: Orthopedics;  Laterality: Left;  . Dental implant  09/26/2014    left lower dental implant  . Total knee arthroplasty Right 10/06/2014    Procedure: RIGHT TOTAL KNEE ARTHROPLASTY;  Surgeon: Gearlean Alf, MD;  Location: [facility];  Service: Orthopedics;  Laterality: Right;  . Radiology with anesthesia N/A 12/29/2014    Procedure: Embolization;  Surgeon: Consuella Lose, MD;  Location: [facility];  Service: Radiology;  Laterality: N/A;  . Cerebral stent    . Joint replacement Right      Knee  . Joint replacement Left     Hip  . Subclavian angiogram  Nov. 8, 2016  . Eye surgery Left Jan. 16, 2017    Cataract   Family History:  Family History  Problem Relation Age of Onset  . Arrhythmia Mother     Has pacemaker  . Hypertension Mother   . Heart disease Mother   . Mental illness Mother   . Varicose Veins Mother   . Depression Mother   . Colon cancer Father   . Cancer Father     Prostate and Colon  . Diabetes Father   . Hypertension Father   . Depression Sister    Social History:   Social History   Social History  . Marital Status: Married    Spouse Name: N/A  . Number of Children: N/A  . Years of Education: N/A   Social History Main Topics  . Smoking status: Current Every  Day Smoker -- 0.50 packs/day    Types: Cigarettes    Start date: 05/23/2015  . Smokeless tobacco: Former Systems developer     Comment: Stated "I smoke off and on"  . Alcohol Use: No     Comment: 10-12-15 per pt no  . Drug Use: No     Comment: 10-12-15 per tp no  . Sexual Activity: Yes   Other Topics Concern  . None   Social History Narrative   Additional Social History: The patient grew up in [place] with both parents and younger sister. 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 He has been married to the same woman for over 74 years and has 2 grown daughters and 5 grandchildren. He denies any legal history but did use drugs and alcohol for quite a number of years in the past  Musculoskeletal: Strength & Muscle Tone: within normal limits Gait & Station: normal Patient leans: N/A  Psychiatric Specialty Exam: Depression         Review of Systems  Eyes: Positive for blurred vision.  Musculoskeletal: Positive for back pain, joint pain and neck pain.  Psychiatric/Behavioral: Positive for depression. The patient is nervous/anxious.     Blood pressure 147/94, pulse 103, height 5' 11.75"  (1.822 m), weight 209 lb (94.802 kg), SpO2 96 %.Body mass index is 28.56 kg/(m^2).  General Appearance: Casual and Fairly Groomed  Eye Contact:  Good  Speech:  Clear and Coherent  Volume:  Normal  Mood:  Fairly goodSomewhat anxious   Affect:  Calm   Thought Process:  Circumstantial and Coherent  Orientation:  Full (Time, Place, and Person)  Thought Content:  Rumination  Suicidal Thoughts:  No  Homicidal Thoughts:  No  Memory:  Immediate;   Fair Recent;   Poor Remote;   Poor  Judgement:  Fair  Insight:  Lacking  Psychomotor Activity:  Normal  Concentration:  Fair  Recall:  Poor  Fund of Knowledge:Fair  Language: Good  Akathisia:  No  Handed:  Right  AIMS (if indicated):    Assets:  Communication Skills Desire for Improvement Resilience Social Support  ADL's:  Intact  Cognition: WNL  Sleep:  ok   Is the patient at risk to self?  No. Has the patient been a risk to self in the past 6 months?  No. Has the patient been a risk to self within the distant past?  No. Is the patient a risk to others?  No. Has the patient been a risk to others in the past 6 months?  No. Has the patient been a risk to others within the distant past?  No.  Allergies:   Allergies  Allergen Reactions  . Aspirin Other (See Comments)    Do not take with depakote.   Marland Kitchen Penicillins Rash   Current Medications: Current Outpatient Prescriptions  Medication Sig Dispense Refill  . lisdexamfetamine (VYVANSE) 70 MG capsule Take 1 capsule (70 mg total) by mouth every morning. 30 capsule 0  . testosterone cypionate (DEPOTESTOTERONE CYPIONATE) 200 MG/ML injection Inject 200 mg into the muscle every 14 (fourteen) days.     Marland Kitchen aspirin 325 MG tablet Take 325 mg by mouth daily.    Marland Kitchen dexlansoprazole (DEXILANT) 60 MG capsule Take 60 mg by mouth every morning.     . divalproex (DEPAKOTE ER) 500 MG 24 hr tablet Take 1 tablet (500 mg total) by mouth at  bedtime. 30 tablet 2  . Dutasteride-Tamsulosin HCl (JALYN) 0.5-0.4  MG CAPS Take 1 capsule by mouth daily at 12 noon.     Marland Kitchen HYDROcodone-acetaminophen (NORCO) 7.5-325 MG per tablet Take 1-2 tablets by mouth every 4 (four) hours as needed for moderate pain.     Marland Kitchen ibuprofen (ADVIL,MOTRIN) 200 MG tablet Take 800 mg by mouth every 8 (eight) hours as needed for mild pain or moderate pain.    Marland Kitchen ipratropium (ATROVENT) 0.03 % nasal spray Place 2 sprays into both nostrils every 12 (twelve) hours as needed for rhinitis. Reported on 10/23/2015    . lamoTRIgine (LAMICTAL) 200 MG tablet Take 1 tablet (200 mg total) by mouth at bedtime. 30 tablet 2  . levalbuterol (XOPENEX HFA) 45 MCG/ACT inhaler Inhale 1-2 puffs into the lungs every 6 (six) hours as needed for wheezing.     Marland Kitchen lisdexamfetamine (VYVANSE) 70 MG capsule Take 1 capsule (70 mg total) by mouth daily. 30 capsule 0  . lisdexamfetamine (VYVANSE) 70 MG capsule Take 1 capsule (70 mg total) by mouth daily. 30 capsule 0  . LYRICA 75 MG capsule 2 (two) times daily.    . montelukast (SINGULAIR) 10 MG tablet Take 10 mg by mouth every morning.     . Probiotic Product (PROBIOTIC DAILY PO) Take 1 capsule by mouth daily. Reported on 02/08/2016    . promethazine (PHENERGAN) 25 MG tablet Take 25 mg by mouth every 6 (six) hours as needed for nausea or vomiting.    . RABEprazole (ACIPHEX) 20 MG tablet Take 20 mg by mouth every evening. Reported on 02/08/2016    . tadalafil (CIALIS) 20 MG tablet Take 20 mg by mouth daily as needed for erectile dysfunction.     No current facility-administered medications for this visit.    Previous Psychotropic Medications: Yes   Substance Abuse History in the last 12 months:  No.  Consequences of Substance Abuse: NA  Medical Decision Making:  Established Problem, Stable/Improving (1), Review or order clinical lab tests (1), Review and summation of old records (2), Review or order medicine tests (1) and Review of Medication Regimen & Side Effects (2)  Treatment Plan Summary: Medication management    The patient will continue Vyvanse 70 mg every morning for ADHD, Lamictal 200 mg daily and Depakote ER 500 mg daily for mood stabilization.He'll be scheduled for counseling with Jenny Reichmann Rodenbaugh He'll return in 3 months or call sooner if needed    [place], [facility] 6/2/20172:11 PM

## 2016-02-15 ENCOUNTER — Ambulatory Visit (HOSPITAL_COMMUNITY): Admission: RE | Admit: 2016-02-15 | Payer: Commercial Managed Care - HMO | Source: Ambulatory Visit

## 2016-03-05 ENCOUNTER — Ambulatory Visit (HOSPITAL_COMMUNITY): Payer: Self-pay | Admitting: Psychology

## 2016-03-18 ENCOUNTER — Ambulatory Visit (INDEPENDENT_AMBULATORY_CARE_PROVIDER_SITE_OTHER): Payer: Commercial Managed Care - HMO | Admitting: Psychology

## 2016-03-18 DIAGNOSIS — F3162 Bipolar disorder, current episode mixed, moderate: Secondary | ICD-10-CM

## 2016-03-19 ENCOUNTER — Encounter (HOSPITAL_COMMUNITY): Payer: Self-pay | Admitting: Psychology

## 2016-03-19 NOTE — Progress Notes (Signed)
Patient:  Philip Gates   DOB: [birth year]  MR Number: [id number]  Location: [facility] [address] [place] [address] Dept: [phone]  Start: 9 AM 10 AM End: 10 AM  Provider/Observer:     Edgardo Roys PSYD  Chief Complaint:      Chief Complaint  Patient presents with  . Agitation  . Stress    Reason For Service:     The patient was referred by Dr. Harrington Challenger for psychotherapeutic interventions.  The patient has a history of mood disturbance as well as attentional issues when he was a teenager. I do think that it is likely overall explained by an underlying bipolar disorder. The patient is having ongoing conflicts at the present time that he felt would be beneficial if he got some counseling with. The patient reports that several years ago he had an affair with a woman that he hated when he was in high school. More recently, his wife and daughter found out about this affair that it occurred in the past but the patient is continued to have contact with this woman ongoing. The patient reports that he has tried to tell his wife that he is not continuing to have an affair but she is very angry and continues to ask him about antidepressant for details. The patient reports that he is unsure whether he wants to stay married to his wife are not. The patient has a history of being very controlling and knows that he made a mistake with the affair. He reports that his daughter has forgiven him for what transpired but he is not sure if his wife ever will. He is not sure if he is going to be able to put up with her constant and not trusting him.  Interventions Strategy:  Cognitive/behavioral psychotherapeutic interventions  Participation Level:   Active  Participation Quality:  Appropriate      Behavioral Observation:  Well Groomed, Alert, and Appropriate.   Current Psychosocial Factors: The patient reports  that there continues to be conflicts and stressors between he and his wife. He reports that he has not been actually able to decide whether she wants to say very to his wife for look for a longer term relationship with his ex-girlfriend. The patient reports that the biggest obstacle to leave and his wife is the fact that he would have such a financial and life disruption with the things that he and his wife accumulated over the years. He is concerned that his daughter would be very angry if he left her mother. The patient reports that there are some concerns about whether his wife will ever be able to let this issue ago. We worked on this concept and pointed out that the patient is a half to really make a decision himself his wife is likely to ever be comfortable with with his insistence that nothing will happen in the future.  Content of Session:   Reviewed current symptoms and worked on Radiographer, therapeutic and strategies.  Current Status:   The patient reports that he has not been raising his voice is getting angry like he used to in the past. He knows that that is counterproductive. He reports that he is doing well in his psychotropic medications stable  Patient Progress:   Stable  Target Goals:   Target goals include reducing anger responses and building better coping skills.  Last Reviewed:   03/18/2016  Goals Addressed Today:  Today we worked on Therapist, occupational and strategies.  Impression/Diagnosis:   The patient has a long history of agitated and impulsive behaviors. He felt that he had attention deficit disorder as a child/teenager but was formally diagnosed with bipolar disorder as an adult. He has had issues with substance use and abuse in the past but has not used for a very long time. He was seen by a psychiatrist in [place] who diagnosed with bipolar disorder and he was started on mood stabilizing medications. He responded well to these medications and continues to report that he  responds well to medications and interventions he is receiving from Dr. Harrington Challenger.  Diagnosis:    Axis I: Bipolar 1 disorder, mixed, moderate ([place])  Avanish Cerullo R, PsyD 03/19/2016

## 2016-04-10 ENCOUNTER — Ambulatory Visit (HOSPITAL_COMMUNITY): Payer: Self-pay | Admitting: Psychology

## 2016-04-28 ENCOUNTER — Encounter (HOSPITAL_COMMUNITY): Payer: Self-pay | Admitting: Psychology

## 2016-04-28 ENCOUNTER — Ambulatory Visit (INDEPENDENT_AMBULATORY_CARE_PROVIDER_SITE_OTHER): Payer: Commercial Managed Care - HMO | Admitting: Psychology

## 2016-04-28 DIAGNOSIS — F3162 Bipolar disorder, current episode mixed, moderate: Secondary | ICD-10-CM

## 2016-04-28 NOTE — Progress Notes (Signed)
Patient:  Philip Gates   DOB: 23-Oct-1955  MR Number: [id number]  Location: [facility] [address] [place] [address] Dept: [phone]  Start: 9 AM 10 AM End: 10 AM  Provider/Observer:     Edgardo Roys PSYD  Chief Complaint:      Chief Complaint  Patient presents with  . Agitation  . Stress    Reason For Service:     The patient was referred by Dr. Harrington Challenger for psychotherapeutic interventions.  The patient has a history of mood disturbance as well as attentional issues when he was a teenager. I do think that it is likely overall explained by an underlying bipolar disorder. The patient is having ongoing conflicts at the present time that he felt would be beneficial if he got some counseling with. The patient reports that several years ago he had an affair with a woman that he hated when he was in high school. More recently, his wife and daughter found out about this affair that it occurred in the past but the patient is continued to have contact with this woman ongoing. The patient reports that he has tried to tell his wife that he is not continuing to have an affair but she is very angry and continues to ask him about antidepressant for details. The patient reports that he is unsure whether he wants to stay married to his wife are not. The patient has a history of being very controlling and knows that he made a mistake with the affair. He reports that his daughter has forgiven him for what transpired but he is not sure if his wife ever will. He is not sure if he is going to be able to put up with her constant and not trusting him.  Interventions Strategy:  Cognitive/behavioral psychotherapeutic interventions  Participation Level:   Active  Participation Quality:  Appropriate      Behavioral Observation:  Well Groomed, Alert, and Appropriate.   Current Psychosocial Factors: The patient reports  that he and his wife are doing a little better and he is working on issues around this.  He reports that his wife has started seeing me.  I did not connect these two people as being married.  I talked with him about any concerns about this and he reported that he was fully ok with this.  He reports that his wife knew he was seeing me when she started seeing me.  I have not talked with her yet about this issue but he is ok.  I offered to refer one of them to other provider but patient reports that he wants to continue to see me and expects his wife to feel the same way.  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  He reprots that he was able to get some insite about himself with this and we worked on these issues..  Content of Session:   Reviewed current symptoms and worked on Radiographer, therapeutic and strategies.  Current Status:   The patient reports that he has not been raising his voice is getting angry like he used to in the past. He knows that that is counterproductive. He reports that he is doing well in his psychotropic medications stable  Patient Progress:   Stable  Target Goals:   Target goals include reducing anger responses and building better coping skills.  Last Reviewed:   04/28/2016  Goals Addressed Today:    Today we worked on Therapist, occupational and strategies.  Impression/Diagnosis:   The patient has a long history of agitated and impulsive behaviors. He felt that he had attention deficit disorder as a child/teenager but was formally diagnosed with bipolar disorder as an adult. He has had issues with substance use and abuse in the past but has not used for a very long time. He was seen by a psychiatrist in [place] who diagnosed with bipolar disorder and he was started on mood stabilizing medications. He responded well to these medications and continues to report that he responds well to medications and interventions he is receiving from Dr.  Harrington Challenger.  Diagnosis:    Axis I: Bipolar 1 disorder, mixed, moderate ([place])  Jameriah Trotti R, PsyD 04/28/2016

## 2016-05-09 ENCOUNTER — Ambulatory Visit (HOSPITAL_COMMUNITY): Payer: Self-pay | Admitting: Psychiatry

## 2016-05-16 ENCOUNTER — Telehealth: Payer: Self-pay | Admitting: *Deleted

## 2016-05-16 NOTE — Telephone Encounter (Signed)
Left telephone voice message with [name]. Returning his earlier voice message regarding setting up an appointment with Dr. Kellie Simmering.  [name] is s/p endovenous laser ablation of right greater saphenous vein by Dr. Kellie Simmering on 12-10-2015 and sclerotherapy (right leg) by Thea Silversmith RN on 01-16-2016.  As I will not be in the office this afternoon, I advised [name] to call [phone] and ask for triage nurse if urgent problem. Made him aware that I would be back in the [facility] office on 05-20-2016 and Kathlee Nations would be back in office on 05-21-2016.

## 2016-05-22 ENCOUNTER — Ambulatory Visit (HOSPITAL_COMMUNITY): Payer: Self-pay | Admitting: Psychiatry

## 2016-05-23 ENCOUNTER — Ambulatory Visit (INDEPENDENT_AMBULATORY_CARE_PROVIDER_SITE_OTHER): Payer: Commercial Managed Care - HMO | Admitting: Psychiatry

## 2016-05-23 ENCOUNTER — Ambulatory Visit (HOSPITAL_COMMUNITY): Payer: Self-pay | Admitting: Psychiatry

## 2016-05-23 ENCOUNTER — Encounter (HOSPITAL_COMMUNITY): Payer: Self-pay | Admitting: Psychiatry

## 2016-05-23 VITALS — BP 140/88 | Ht 71.0 in | Wt 213.0 lb

## 2016-05-23 DIAGNOSIS — F3162 Bipolar disorder, current episode mixed, moderate: Secondary | ICD-10-CM

## 2016-05-23 MED ORDER — LISDEXAMFETAMINE DIMESYLATE 70 MG PO CAPS: 70.0000 mg | ORAL_CAPSULE | Freq: Every morning | ORAL | 0 refills | Status: DC

## 2016-05-23 MED ORDER — LISDEXAMFETAMINE DIMESYLATE 70 MG PO CAPS: 70.0000 mg | ORAL_CAPSULE | Freq: Every day | ORAL | 0 refills | Status: DC

## 2016-05-23 MED ORDER — LAMOTRIGINE 200 MG PO TABS: 200.0000 mg | ORAL_TABLET | Freq: Every day | ORAL | 2 refills | Status: DC

## 2016-05-23 MED ORDER — DIVALPROEX SODIUM ER 500 MG PO TB24: 500.0000 mg | ORAL_TABLET | Freq: Every day | ORAL | 2 refills | Status: DC

## 2016-05-23 NOTE — Progress Notes (Signed)
Patient ID: Philip Gates, male   DOB: [birth year], 60 y.o.   MRN: [id number] Patient ID: Philip Gates, male   DOB: [birth year], 60 y.o.   MRN: [id number]  Psychiatric Initial Adult Assessment   Patient Identification: Philip Gates MRN:  [id number] Date of Evaluation:  05/23/2016 Referral Source: Dr. Nevada Crane Chief Complaint:   Chief Complaint    ADD; Depression; Anxiety; Follow-up     Visit Diagnosis:    ICD-9-CM ICD-10-CM   1. Bipolar 1 disorder, mixed, moderate (HCC) 296.62 F31.62    Diagnosis:   Patient Active Problem List   Diagnosis Date Noted  . Varicose veins of right lower extremity with complications 123456 [id number]  . Bipolar 1 disorder, mixed, moderate ([place]) [F31.62] 10/12/2015  . Attention deficit hyperactivity disorder (ADHD) [F90.9] 10/12/2015  . Chronic venous insufficiency [I87.2] 06/25/2015  . SBO (small bowel obstruction) ([place]) [K56.69] 01/16/2015  . Cerebral aneurysm [I67.1] 12/29/2014  . OA (osteoarthritis) of knee [M17.9] 10/06/2014  . Facial cellulitis [id number] 05/31/2014  . Asthma [J45.909]   . GERD (gastroesophageal reflux disease) [K21.9]   . Bipolar 1 disorder ([place]) [F31.9]   . OA (osteoarthritis) of hip [M16.9] 01/03/2013   History of Present Illness:  This patient is a 61 year old married white male lives with his wife in [place]. 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  The patient was referred by his primary physician, Dr. Wende Neighbors, for further assessment and treatment of bipolar disorder and ADHD.  The patient is a vague historian and he tends to ramble a lot. Apparently as a child he was very unfocused and hyperactive. He is always had difficulty completing tasks. He was never diagnosed with ADHD in childhood and he was able to complete high school and 1 year of machine shop training. He has worked in numerous jobs in Film/video editor.  The patient drank heavily  and used drugs like marijuana and cocaine for quite a number of years. He quit all this about 10 years ago. About 7 years ago he became increasingly angry and moody and irritable. Some of it started when he and his wife are building a house and he felt like the contractor "screwed me out of $100,000.". He began seeing Dr. Candis Schatz in [place] and was diagnosed as being bipolar because he had significant mood swings anger and irritability. Since that he's been on a combination of Lamictal and Depakote which he thinks has helped. Because of his long-term difficulties with focus and concentration he was started on Vyvanse which she also thinks has helped.  The patient still has some difficulties with irritability but not like he used to. He and his wife don't get along but he admits that he was having an affair with an old girlfriend for the last few years and still talks to this woman periodically. His wife found out about it and is quite resentful. He also has significant memory problems and often loses his train of thought. He denies any head injuries. 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 He has numerous medical problems and has chronic pain from arthritis. Last year he also had knee surgery and is well as a small bowel obstruction.  The patient no longer uses drugs or alcohol. He is active in church and does a lot of things with his cousin. His energy is pretty good and he states he sleeps fairly well. He has  never had psychiatric hospitalization or treatment for drug or alcohol abuse  The patient returns after 3 months. He states that his lmarriage is not much better. From his point of view his wife is throwing things up in his face that he is really not doing any more and doesn't trust him. They had a big argument the other night and now she is threatening to leave. He doesn't really wanted to leave but on the other hand he gets along better with the woman he had the affair with  and he does with his wife. I suggested he asked Dr. Jefm Miles to do marital counseling and he actually sees the wife as well. I've spoken him today and he is willing to do this. The patient is not drinking and states the medications of helped his mood is much as can be expected. He is staying focused Elements:  Location:  Global. Quality:  Stable. Severity:  Moderate to severe. Timing:  Daily. Duration:  Several years. Context:  Conflicts with wife. Associated Signs/Symptoms: Depression Symptoms:  depressed mood, psychomotor agitation, difficulty concentrating, anxiety, (Hypo) Manic Symptoms:  Distractibility, Irritable Mood, Labiality of Mood,  Past Medical History:  Past Medical History:  Diagnosis Date  . Acid reflux   . 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 Arthritis   . Asthma    ACTIVITY INDUCED  . Bipolar 1 disorder ([place])   . BPH (benign prostatic hyperplasia)   . Cellulitis 05/2014   right side face  . Cerebral aneurysm   . GERD (gastroesophageal reflux disease)   . Neck pain, chronic   . Sinus drainage   . 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  . Venous insufficiency     Past Surgical History:  Procedure Laterality Date  . cerebral stent    . dental implant  09/26/2014   left lower dental implant  . EYE SURGERY Left Jan. 16, 2017   Cataract  . JOINT REPLACEMENT Right    Knee  . JOINT REPLACEMENT Left    Hip  . NOSE SURGERY    . RADIOLOGY WITH ANESTHESIA N/A 12/29/2014   Procedure: Embolization;  Surgeon: Consuella Lose, MD;  Location: [facility];  Service: Radiology;  Laterality: N/A;  . SUBCLAVIAN ANGIOGRAM  Nov. 8, 2016  . TOTAL HIP ARTHROPLASTY Left 01/03/2013   Procedure: TOTAL HIP ARTHROPLASTY ANTERIOR APPROACH;  Surgeon: Gearlean Alf, MD;  Location: [facility];  Service: Orthopedics;  Laterality: Left;  . TOTAL KNEE ARTHROPLASTY Right 10/06/2014   Procedure: RIGHT TOTAL KNEE  ARTHROPLASTY;  Surgeon: Gearlean Alf, MD;  Location: [facility];  Service: Orthopedics;  Laterality: Right;   Family History:  Family History  Problem Relation Age of Onset  . Arrhythmia Mother     Has pacemaker  . Hypertension Mother   . Heart disease Mother   . Mental illness Mother   . Varicose Veins Mother   . Depression Mother   . Colon cancer Father   . Cancer Father     Prostate and Colon  . Diabetes Father   . Hypertension Father   . Depression Sister    Social History:   Social History   Social History  . Marital status: Married    Spouse name: N/A  . Number of children: N/A  . Years of education: N/A   Social History Main Topics  . Smoking status: Current Every Day Smoker  Packs/day: 0.50    Types: Cigarettes    Start date: 05/23/2015  . Smokeless tobacco: Former Systems developer     Comment: Stated "I smoke off and on"  . Alcohol use No     Comment: 10-12-15 per pt no  . Drug use: No     Comment: 10-12-15 per tp no  . Sexual activity: Yes   Other Topics Concern  . None   Social History Narrative  . None   Additional Social History: The patient grew up in [place] with both parents and younger sister. 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 He has been married to the same woman for over 76 years and has 2 grown daughters and 5 grandchildren. He denies any legal history but did use drugs and alcohol for quite a number of years in the past  Musculoskeletal: Strength & Muscle Tone: within normal limits Gait & Station: normal Patient leans: N/A  Psychiatric Specialty Exam: Depression         Past medical history includes anxiety.   Anxiety  Symptoms include nervous/anxious behavior.      Review of Systems  Eyes: Positive for blurred vision.  Musculoskeletal: Positive for back pain, joint pain and neck pain.  Psychiatric/Behavioral: Positive for depression. The  patient is nervous/anxious.     Blood pressure 140/88, height 5\' 11"  (1.803 m), weight 213 lb (96.6 kg).Body mass index is 29.71 kg/m.  General Appearance: Casual and Fairly Groomed  Eye Contact:  Good  Speech:  Clear and Coherent  Volume:  Normal  Mood:  Fairly goodSomewhat anxious   Affect:  neutral  Thought Process:  Circumstantial and Coherent  Orientation:  Full (Time, Place, and Person)  Thought Content:  Rumination  Suicidal Thoughts:  No  Homicidal Thoughts:  No  Memory:  Immediate;   Fair Recent;   Poor Remote;   Poor  Judgement:  Fair  Insight:  Lacking  Psychomotor Activity:  Normal  Concentration:  Fair  Recall:  Poor  Fund of Knowledge:Fair  Language: Good  Akathisia:  No  Handed:  Right  AIMS (if indicated):    Assets:  Communication Skills Desire for Improvement Resilience Social Support  ADL's:  Intact  Cognition: WNL  Sleep:  ok   Is the patient at risk to self?  No. Has the patient been a risk to self in the past 6 months?  No. Has the patient been a risk to self within the distant past?  No. Is the patient a risk to others?  No. Has the patient been a risk to others in the past 6 months?  No. Has the patient been a risk to others within the distant past?  No.  Allergies:   Allergies  Allergen Reactions  . Aspirin Other (See Comments)    Do not take with depakote.   Marland Kitchen Penicillins Rash   Current Medications: Current Outpatient Prescriptions  Medication Sig Dispense Refill  . aspirin 325 MG tablet Take 325 mg by mouth daily.    Marland Kitchen dexlansoprazole (DEXILANT) 60 MG capsule Take 60 mg by mouth every morning.     . divalproex (DEPAKOTE ER) 500 MG 24 hr tablet Take 1 tablet (500 mg total) by mouth at bedtime. 30 tablet 2  . Dutasteride-Tamsulosin HCl (JALYN) 0.5-0.4 MG CAPS Take 1 capsule by mouth daily at 12 noon.     Marland Kitchen HYDROcodone-acetaminophen (NORCO) 7.5-325 MG  per tablet Take 1-2 tablets by mouth every 4 (four) hours as needed for moderate pain.      Marland Kitchen ibuprofen (ADVIL,MOTRIN) 200 MG tablet Take 800 mg by mouth every 8 (eight) hours as needed for mild pain or moderate pain.    Marland Kitchen ipratropium (ATROVENT) 0.03 % nasal spray Place 2 sprays into both nostrils every 12 (twelve) hours as needed for rhinitis. Reported on 10/23/2015    . lamoTRIgine (LAMICTAL) 200 MG tablet Take 1 tablet (200 mg total) by mouth at bedtime. 30 tablet 2  . levalbuterol (XOPENEX HFA) 45 MCG/ACT inhaler Inhale 1-2 puffs into the lungs every 6 (six) hours as needed for wheezing.     Marland Kitchen lisdexamfetamine (VYVANSE) 70 MG capsule Take 1 capsule (70 mg total) by mouth daily. 30 capsule 0  . lisdexamfetamine (VYVANSE) 70 MG capsule Take 1 capsule (70 mg total) by mouth daily. 30 capsule 0  . lisdexamfetamine (VYVANSE) 70 MG capsule Take 1 capsule (70 mg total) by mouth every morning. 30 capsule 0  . LYRICA 75 MG capsule 2 (two) times daily.    . montelukast (SINGULAIR) 10 MG tablet Take 10 mg by mouth every morning.     . Probiotic Product (PROBIOTIC DAILY PO) Take 1 capsule by mouth daily. Reported on 02/08/2016    . promethazine (PHENERGAN) 25 MG tablet Take 25 mg by mouth every 6 (six) hours as needed for nausea or vomiting.    . RABEprazole (ACIPHEX) 20 MG tablet Take 20 mg by mouth every evening. Reported on 02/08/2016    . tadalafil (CIALIS) 20 MG tablet Take 20 mg by mouth daily as needed for erectile dysfunction.    Marland Kitchen testosterone cypionate (DEPOTESTOTERONE CYPIONATE) 200 MG/ML injection Inject 200 mg into the muscle every 14 (fourteen) days.      No current facility-administered medications for this visit.     Previous Psychotropic Medications: Yes   Substance Abuse History in the last 12 months:  No.  Consequences of Substance Abuse: NA  Medical Decision Making:  Established Problem, Stable/Improving (1), Review or order clinical lab tests (1), Review and summation of old records (2), Review or order medicine tests (1) and Review of Medication Regimen & Side Effects  (2)  Treatment Plan Summary: Medication management   The patient will continue Vyvanse 70 mg every morning for ADHD, Lamictal 200 mg daily and Depakote ER 500 mg daily for mood stabilization.He'll be scheduled for counseling with Tera Mater He'll return in 3 months or call sooner if needed    [place], [facility] 9/15/201711:33 AM

## 2016-05-26 ENCOUNTER — Telehealth (HOSPITAL_COMMUNITY): Payer: Self-pay | Admitting: *Deleted

## 2016-05-27 ENCOUNTER — Ambulatory Visit (HOSPITAL_COMMUNITY): Payer: Commercial Managed Care - HMO | Admitting: Psychology

## 2016-05-28 ENCOUNTER — Ambulatory Visit (HOSPITAL_COMMUNITY): Payer: Self-pay | Admitting: Psychiatry

## 2016-06-12 ENCOUNTER — Ambulatory Visit (INDEPENDENT_AMBULATORY_CARE_PROVIDER_SITE_OTHER): Payer: Commercial Managed Care - HMO | Admitting: Psychology

## 2016-06-12 DIAGNOSIS — F3162 Bipolar disorder, current episode mixed, moderate: Secondary | ICD-10-CM | POA: Diagnosis not present

## 2016-06-13 ENCOUNTER — Encounter (HOSPITAL_COMMUNITY): Payer: Self-pay | Admitting: Psychology

## 2016-06-13 NOTE — Progress Notes (Signed)
Patient:  Philip Gates   DOB: May 07, 1956  MR Number: [id number]  Location: [facility] [address] [place] [address] Dept: [phone]  Start: 9 AM 10 AM End: 10 AM  Provider/Observer:     Edgardo Roys PSYD  Chief Complaint:      Chief Complaint  Patient presents with  . Agitation  . Stress    Reason For Service:     The patient was referred by Dr. Harrington Challenger for psychotherapeutic interventions.  The patient has a history of mood disturbance as well as attentional issues when he was a teenager. I do think that it is likely overall explained by an underlying bipolar disorder. The patient is having ongoing conflicts at the present time that he felt would be beneficial if he got some counseling with. The patient reports that several years ago he had an affair with a woman that he hated when he was in high school. More recently, his wife and daughter found out about this affair that it occurred in the past but the patient is continued to have contact with this woman ongoing. The patient reports that he has tried to tell his wife that he is not continuing to have an affair but she is very angry and continues to ask him about antidepressant for details. The patient reports that he is unsure whether he wants to stay married to his wife are not. The patient has a history of being very controlling and knows that he made a mistake with the affair. He reports that his daughter has forgiven him for what transpired but he is not sure if his wife ever will. He is not sure if he is going to be able to put up with her constant and not trusting him.  Interventions Strategy:  Cognitive/behavioral psychotherapeutic interventions  Participation Level:   Active  Participation Quality:  Appropriate      Behavioral Observation:  Well Groomed, Alert, and Appropriate.   Current Psychosocial Factors: The patient Came in  today along with his wife. They describe some of the struggles that they have had within their marriage but also some of the things are working well but better. The patient reports that he is still very upset and angry with the feeling like his wife is watching him. However, she denies that she is actively spying on him. He does acknowledge that his interactions with this other woman while they were not sexual was disrespectful to his wife and he understands why she has or concerns.  Content of Session:   Reviewed current symptoms and worked on Radiographer, therapeutic and strategies.  Current Status:   The patient reports that he has not been raising his voice is getting angry like he used to in the past. He knows that that is counterproductive. He reports that he is doing well in his psychotropic medications stable  the patient reports that he would like to continue to work on marital issues and does feel like he is making some efforts.  Patient Progress:   Stable  Target Goals:   Target goals include reducing anger responses and building better coping skills.  Last Reviewed:   06/13/2016  Goals Addressed Today:    Today we worked on Therapist, occupational and strategies.  Impression/Diagnosis:   The patient has a long history of agitated and impulsive behaviors. He felt that he had attention deficit disorder as a child/teenager but was formally diagnosed with  bipolar disorder as an adult. He has had issues with substance use and abuse in the past but has not used for a very long time. He was seen by a psychiatrist in [place] who diagnosed with bipolar disorder and he was started on mood stabilizing medications. He responded well to these medications and continues to report that he responds well to medications and interventions he is receiving from Dr. Harrington Challenger.  Diagnosis:    Axis I: Bipolar 1 disorder, mixed, moderate ([place])  RODENBOUGH,JOHN R, PsyD 06/13/2016

## 2016-06-26 ENCOUNTER — Ambulatory Visit (INDEPENDENT_AMBULATORY_CARE_PROVIDER_SITE_OTHER): Payer: Commercial Managed Care - HMO | Admitting: Psychology

## 2016-06-26 ENCOUNTER — Encounter (HOSPITAL_COMMUNITY): Payer: Self-pay | Admitting: Psychology

## 2016-06-26 DIAGNOSIS — F3162 Bipolar disorder, current episode mixed, moderate: Secondary | ICD-10-CM

## 2016-06-26 NOTE — Progress Notes (Signed)
Patient:  Philip Gates   DOB: [birth year]  MR Number: [id number]  Location: [facility] [address] [place] [address] Dept: [phone]  Start: 9 AM 10 AM End: 10 AM  Provider/Observer:     Edgardo Roys PSYD  Chief Complaint:      Chief Complaint  Patient presents with  . Agitation  . Stress    Reason For Service:     The patient was referred by Dr. Harrington Challenger for psychotherapeutic interventions.  The patient has a history of mood disturbance as well as attentional issues when he was a teenager. I do think that it is likely overall explained by an underlying bipolar disorder. The patient is having ongoing conflicts at the present time that he felt would be beneficial if he got some counseling with. The patient reports that several years ago he had an affair with a woman that he hated when he was in high school. More recently, his wife and daughter found out about this affair that it occurred in the past but the patient is continued to have contact with this woman ongoing. The patient reports that he has tried to tell his wife that he is not continuing to have an affair but she is very angry and continues to ask him about antidepressant for details. The patient reports that he is unsure whether he wants to stay married to his wife are not. The patient has a history of being very controlling and knows that he made a mistake with the affair. He reports that his daughter has forgiven him for what transpired but he is not sure if his wife ever will. He is not sure if he is going to be able to put up with her constant and not trusting him.  Interventions Strategy:  Cognitive/behavioral psychotherapeutic interventions  Participation Level:   Active  Participation Quality:  Appropriate      Behavioral Observation:  Well Groomed, Alert, and Appropriate.   Current Psychosocial Factors: The patient and  wife return with significant conflict brewing between the two.  Compitition rising.  Content of Session:   Reviewed current symptoms and worked on Radiographer, therapeutic and strategies.  Current Status:   The patient reports that he has not been raising his voice is getting angry like he used to in the past. He knows that that is counterproductive. He reports that he is doing well in his psychotropic medications stable  the patient reports that he would like to continue to work on marital issues and does feel like he is making some efforts.  Patient Progress:   Stable  Target Goals:   Target goals include reducing anger responses and building better coping skills.  Last Reviewed:   06/26/2016  Goals Addressed Today:    Today we worked on Therapist, occupational and strategies.  Impression/Diagnosis:   The patient has a long history of agitated and impulsive behaviors. He felt that he had attention deficit disorder as a child/teenager but was formally diagnosed with bipolar disorder as an adult. He has had issues with substance use and abuse in the past but has not used for a very long time. He was seen by a psychiatrist in [place] who diagnosed with bipolar disorder and he was started on mood stabilizing medications. He responded well to these medications and continues to report that he responds well to medications and interventions he is receiving from Dr. Harrington Challenger.  Diagnosis:  Axis I: Bipolar 1 disorder, mixed, moderate ([place])  Adriannah Steinkamp R, PsyD 06/26/2016

## 2016-07-10 ENCOUNTER — Ambulatory Visit (INDEPENDENT_AMBULATORY_CARE_PROVIDER_SITE_OTHER): Payer: Commercial Managed Care - HMO | Admitting: Psychology

## 2016-07-10 ENCOUNTER — Encounter (HOSPITAL_COMMUNITY): Payer: Self-pay | Admitting: Psychology

## 2016-07-10 DIAGNOSIS — F3162 Bipolar disorder, current episode mixed, moderate: Secondary | ICD-10-CM | POA: Diagnosis not present

## 2016-07-10 NOTE — Progress Notes (Signed)
Patient:  Philip Gates   DOB: 04-11-1956  MR Number: [id number]  Location: [facility] [address] [place] [address] Dept: [phone]  Start: 8 AM End: 9 AM  Provider/Observer:     Edgardo Roys PSYD  Chief Complaint:      Chief Complaint  Patient presents with  . Agitation    Reason For Service:     The patient was referred by Dr. Harrington Challenger for psychotherapeutic interventions.  The patient has a history of mood disturbance as well as attentional issues when he was a teenager. I do think that it is likely overall explained by an underlying bipolar disorder. The patient is having ongoing conflicts at the present time that he felt would be beneficial if he got some counseling with. The patient reports that several years ago he had an affair with a woman that he hated when he was in high school. More recently, his wife and daughter found out about this affair that it occurred in the past but the patient is continued to have contact with this woman ongoing. The patient reports that he has tried to tell his wife that he is not continuing to have an affair but she is very angry and continues to ask him about antidepressant for details. The patient reports that he is unsure whether he wants to stay married to his wife are not. The patient has a history of being very controlling and knows that he made a mistake with the affair. He reports that his daughter has forgiven him for what transpired but he is not sure if his wife ever will. He is not sure if he is going to be able to put up with her constant and not trusting him.  Interventions Strategy:  Cognitive/behavioral psychotherapeutic interventions  Participation Level:   Active  Participation Quality:  Appropriate      Behavioral Observation:  Well Groomed, Alert, and Appropriate.   Current Psychosocial Factors: The patient and wife return And  worked on some of the significant complex been going on between the patient and his wife. The patient had a lot of questions about what bipolar affective disorder was in general  Content of Session:   Reviewed current symptoms and worked on Radiographer, therapeutic and strategies.  Current Status:   The patient reports that he has not been raising his voice is getting angry like he used to in the past. He knows that that is counterproductive. He reports that he is doing well in his psychotropic medications stable  the patient reports that he would like to continue to work on marital issues and does feel like he is making some efforts.  Patient Progress:   Stable  Target Goals:   Target goals include reducing anger responses and building better coping skills.  Last Reviewed:   07/10/2016  Goals Addressed Today:    Today we worked on Therapist, occupational and strategies.  Impression/Diagnosis:   The patient has a long history of agitated and impulsive behaviors. He felt that he had attention deficit disorder as a child/teenager but was formally diagnosed with bipolar disorder as an adult. He has had issues with substance use and abuse in the past but has not used for a very long time. He was seen by a psychiatrist in [place] who diagnosed with bipolar disorder and he was started on mood stabilizing medications. He responded well to these medications and continues to report that he  responds well to medications and interventions he is receiving from Dr. Harrington Challenger.  Diagnosis:    Axis I: Bipolar 1 disorder, mixed, moderate ([place])  RODENBOUGH,JOHN R, PsyD 07/10/2016

## 2016-07-24 ENCOUNTER — Ambulatory Visit (INDEPENDENT_AMBULATORY_CARE_PROVIDER_SITE_OTHER): Payer: Commercial Managed Care - HMO | Admitting: Psychology

## 2016-07-24 DIAGNOSIS — F3162 Bipolar disorder, current episode mixed, moderate: Secondary | ICD-10-CM

## 2016-07-25 ENCOUNTER — Encounter (HOSPITAL_COMMUNITY): Payer: Self-pay | Admitting: Psychology

## 2016-07-25 NOTE — Progress Notes (Signed)
Patient:  Philip Gates   DOB: 01-01-1956  MR Number: [id number]  Location: [facility] [address] [place] [address] Dept: [phone]  Start: 9AM End: 10 AM  Provider/Observer:     Edgardo Roys PSYD  Chief Complaint:      Chief Complaint  Patient presents with  . Agitation    Reason For Service:     The patient was referred by Dr. Harrington Challenger for psychotherapeutic interventions.  The patient has a history of mood disturbance as well as attentional issues when he was a teenager. I do think that it is likely overall explained by an underlying bipolar disorder. The patient is having ongoing conflicts at the present time that he felt would be beneficial if he got some counseling with. The patient reports that several years ago he had an affair with a woman that he hated when he was in high school. More recently, his wife and daughter found out about this affair that it occurred in the past but the patient is continued to have contact with this woman ongoing. The patient reports that he has tried to tell his wife that he is not continuing to have an affair but she is very angry and continues to ask him about antidepressant for details. The patient reports that he is unsure whether he wants to stay married to his wife are not. The patient has a history of being very controlling and knows that he made a mistake with the affair. He reports that his daughter has forgiven him for what transpired but he is not sure if his wife ever will. He is not sure if he is going to be able to put up with her constant and not trusting him.  Interventions Strategy:  Cognitive/behavioral psychotherapeutic interventions  Participation Level:   Active  Participation Quality:  Appropriate      Behavioral Observation:  Well Groomed, Alert, and Appropriate.   Current Psychosocial Factors: The patient and wife return And  worked on some of the significant complex been going on between the patient and his wife. The patient had a lot of questions about what bipolar affective disorder was in general  Content of Session:   Reviewed current symptoms and worked on Radiographer, therapeutic and strategies.  Current Status:   The patient reports that he has not been raising his voice is getting angry like he used to in the past. He knows that that is counterproductive. He reports that he is doing well in his psychotropic medications stable  the patient reports that he would like to continue to work on marital issues and does feel like he is making some efforts.  Patient Progress:   Stable  Target Goals:   Target goals include reducing anger responses and building better coping skills.  Last Reviewed:   07/24/2016  Goals Addressed Today:    Today we worked on Therapist, occupational and strategies.  Impression/Diagnosis:   The patient has a long history of agitated and impulsive behaviors. He felt that he had attention deficit disorder as a child/teenager but was formally diagnosed with bipolar disorder as an adult. He has had issues with substance use and abuse in the past but has not used for a very long time. He was seen by a psychiatrist in [place] who diagnosed with bipolar disorder and he was started on mood stabilizing medications. He responded well to these medications and continues to report that he responds  well to medications and interventions he is receiving from Dr. Harrington Challenger.  Diagnosis:    Axis I: Bipolar 1 disorder, mixed, moderate ([place])  RODENBOUGH,JOHN R, PsyD 07/25/2016

## 2016-08-07 ENCOUNTER — Ambulatory Visit (HOSPITAL_COMMUNITY): Payer: Commercial Managed Care - HMO | Admitting: Psychology

## 2016-08-21 ENCOUNTER — Ambulatory Visit (INDEPENDENT_AMBULATORY_CARE_PROVIDER_SITE_OTHER): Payer: Commercial Managed Care - HMO | Admitting: Psychology

## 2016-08-21 DIAGNOSIS — F3162 Bipolar disorder, current episode mixed, moderate: Secondary | ICD-10-CM | POA: Diagnosis not present

## 2016-08-22 ENCOUNTER — Ambulatory Visit (INDEPENDENT_AMBULATORY_CARE_PROVIDER_SITE_OTHER): Payer: Commercial Managed Care - HMO | Admitting: Psychiatry

## 2016-08-22 ENCOUNTER — Encounter (HOSPITAL_COMMUNITY): Payer: Self-pay | Admitting: Psychiatry

## 2016-08-22 ENCOUNTER — Ambulatory Visit (HOSPITAL_COMMUNITY): Payer: Self-pay | Admitting: Psychiatry

## 2016-08-22 VITALS — BP 166/94 | HR 98 | Ht 71.0 in | Wt 213.6 lb

## 2016-08-22 DIAGNOSIS — F17210 Nicotine dependence, cigarettes, uncomplicated: Secondary | ICD-10-CM

## 2016-08-22 DIAGNOSIS — Z818 Family history of other mental and behavioral disorders: Secondary | ICD-10-CM

## 2016-08-22 DIAGNOSIS — F902 Attention-deficit hyperactivity disorder, combined type: Secondary | ICD-10-CM

## 2016-08-22 DIAGNOSIS — Z800 Family history of malignant neoplasm of digestive organs: Secondary | ICD-10-CM

## 2016-08-22 DIAGNOSIS — F3162 Bipolar disorder, current episode mixed, moderate: Secondary | ICD-10-CM | POA: Diagnosis not present

## 2016-08-22 DIAGNOSIS — Z9889 Other specified postprocedural states: Secondary | ICD-10-CM | POA: Diagnosis not present

## 2016-08-22 DIAGNOSIS — Z8249 Family history of ischemic heart disease and other diseases of the circulatory system: Secondary | ICD-10-CM | POA: Diagnosis not present

## 2016-08-22 DIAGNOSIS — Z833 Family history of diabetes mellitus: Secondary | ICD-10-CM

## 2016-08-22 DIAGNOSIS — Z8042 Family history of malignant neoplasm of prostate: Secondary | ICD-10-CM

## 2016-08-22 MED ORDER — LAMOTRIGINE 200 MG PO TABS: 200.0000 mg | ORAL_TABLET | Freq: Every day | ORAL | 2 refills | Status: DC

## 2016-08-22 MED ORDER — LISDEXAMFETAMINE DIMESYLATE 70 MG PO CAPS: 70.0000 mg | ORAL_CAPSULE | Freq: Every morning | ORAL | 0 refills | Status: DC

## 2016-08-22 MED ORDER — DIVALPROEX SODIUM ER 500 MG PO TB24: 500.0000 mg | ORAL_TABLET | Freq: Every day | ORAL | 2 refills | Status: DC

## 2016-08-22 MED ORDER — LISDEXAMFETAMINE DIMESYLATE 70 MG PO CAPS: 70.0000 mg | ORAL_CAPSULE | Freq: Every day | ORAL | 0 refills | Status: DC

## 2016-08-22 NOTE — Progress Notes (Signed)
Patient ID: Philip Gates, male   DOB: Jun 19, 1956, 60 y.o.   MRN: [id number] Patient ID: Philip Gates, male   DOB: 03-07-1956, 60 y.o.   MRN: [id number]  Psychiatric Initial Adult Assessment   Patient Identification: Philip Gates MRN:  [id number] Date of Evaluation:  08/22/2016 Referral Source: Dr. Nevada Crane Chief Complaint:   Chief Complaint    Depression; Anxiety; ADD; Follow-up     Visit Diagnosis:    ICD-9-CM ICD-10-CM   1. Bipolar 1 disorder, mixed, moderate (H[facility]) 296.62 F31.62 Valproic Acid level  2. Attention deficit hyperactivity disorder (ADHD), combined type 314.01 F90.2    Diagnosis:   Patient Active Problem List   Diagnosis Date Noted  . Varicose veins of right lower extremity with complications 123456 [id number]  . Bipolar 1 disorder, mixed, moderate ([place]) [F31.62] 10/12/2015  . Attention deficit hyperactivity disorder (ADHD) [F90.9] 10/12/2015  . Chronic venous insufficiency [I87.2] 06/25/2015  . SBO (small bowel obstruction) [K56.609] 01/16/2015  . Cerebral aneurysm [I67.1] 12/29/2014  . OA (osteoarthritis) of knee [M17.10] 10/06/2014  . Facial cellulitis [id number] 05/31/2014  . Asthma [J45.909]   . GERD (gastroesophageal reflux disease) [K21.9]   . Bipolar 1 disorder ([place]) [F31.9]   . OA (osteoarthritis) of hip [M16.9] 01/03/2013   History of Present Illness:  This patient is a 60 year old married white male lives with his wife in [place]. 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  The patient was referred by his primary physician, Dr. Wende Neighbors, for further assessment and treatment of bipolar disorder and ADHD.  The patient is a vague historian and he tends to ramble a lot. Apparently as a child he was very unfocused and hyperactive. He is always had difficulty completing tasks. 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 He has worked in numerous jobs in Film/video editor.  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 He quit all this about 10 years ago. About 7 years ago he became increasingly angry and moody and irritable. Some of it started when he and his wife are building a house and he felt like the contractor "screwed me out of $100,000.". He began seeing Dr. Candis Schatz in [place] and was diagnosed as being bipolar because he had significant mood swings anger and irritability. Since that he's been on a combination of Lamictal and Depakote which he thinks has helped. Because of his long-term difficulties with focus and concentration he was started on Vyvanse which she also thinks has helped.  The patient still has some difficulties with irritability but not like he used to. He and his wife don't get along but he admits that he was having an affair with an old girlfriend for the last few years and still talks to this woman periodically. His wife found out about it and is quite resentful. He also has significant memory problems and often loses his train of thought. He denies any head injuries. 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 He has numerous medical problems and has chronic pain from arthritis. Last year he also had knee surgery and is well as a small bowel obstruction.  The patient no longer uses drugs or alcohol. He is active in church and does a lot of things with his cousin. His  energy is pretty good and he states he sleeps fairly well. He has never had psychiatric hospitalization or treatment for drug or alcohol abuse  The patient returns after 59 months. He states that his lmarriage is going better and he is less angry and irritable. He states that he wonders if the Lamictal and/or Depakote are causing sexual dysfunction. He also has low testosterone so it's difficult to say. He would like to get off these at some  point I stated that they seem to have helped his anger and irritability. We will start by taking his Depakote level and perhaps next time we can start to taper this went off. He's feeling much more focused on the Vyvanse Elements:  Location:  Global. Quality:  Stable. Severity:  Moderate to severe. Timing:  Daily. Duration:  Several years. Context:  Conflicts with wife. Associated Signs/Symptoms: Depression Symptoms:  depressed mood, psychomotor agitation, difficulty concentrating, anxiety, (Hypo) Manic Symptoms:  Distractibility, Irritable Mood, Labiality of Mood,  Past Medical History:  Past Medical History:  Diagnosis Date  . Acid reflux   . 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 Arthritis   . Asthma    ACTIVITY INDUCED  . Bipolar 1 disorder ([place])   . BPH (benign prostatic hyperplasia)   . Cellulitis 05/2014   right side face  . Cerebral aneurysm   . GERD (gastroesophageal reflux disease)   . Neck pain, chronic   . Sinus drainage   . 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  . Venous insufficiency     Past Surgical History:  Procedure Laterality Date  . cerebral stent    . dental implant  09/26/2014   left lower dental implant  . EYE SURGERY Left Jan. 16, 2017   Cataract  . JOINT REPLACEMENT Right    Knee  . JOINT REPLACEMENT Left    Hip  . NOSE SURGERY    . RADIOLOGY WITH ANESTHESIA N/A 12/29/2014   Procedure: Embolization;  Surgeon: Consuella Lose, MD;  Location: [facility];  Service: Radiology;  Laterality: N/A;  . SUBCLAVIAN ANGIOGRAM  Nov. 8, 2016  . TOTAL HIP ARTHROPLASTY Left 01/03/2013   Procedure: TOTAL HIP ARTHROPLASTY ANTERIOR APPROACH;  Surgeon: Gearlean Alf, MD;  Location: [facility];  Service: Orthopedics;  Laterality: Left;  . TOTAL KNEE ARTHROPLASTY Right 10/06/2014   Procedure: RIGHT TOTAL KNEE ARTHROPLASTY;  Surgeon: Gearlean Alf, MD;  Location: [facility];  Service: Orthopedics;   Laterality: Right;   Family History:  Family History  Problem Relation Age of Onset  . Arrhythmia Mother     Has pacemaker  . Hypertension Mother   . Heart disease Mother   . Mental illness Mother   . Varicose Veins Mother   . Depression Mother   . Colon cancer Father   . Cancer Father     Prostate and Colon  . Diabetes Father   . Hypertension Father   . Depression Sister    Social History:   Social History   Social History  . Marital status: Married    Spouse name: N/A  . Number of children: N/A  . Years of education: N/A   Social History Main Topics  . Smoking status: Current Every Day Smoker    Packs/day: 0.50    Types: Cigarettes    Start date: 05/23/2015  . Smokeless tobacco: Former Systems developer     Comment: Stated "I smoke off  and on"  . Alcohol use No     Comment: 10-12-15 per pt no  . Drug use: No     Comment: 10-12-15 per tp no  . Sexual activity: Yes   Other Topics Concern  . None   Social History Narrative  . None   Additional Social History: The patient grew up in [place] with both parents and younger sister. 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 He has been married to the same woman for over 31 years and has 2 grown daughters and 5 grandchildren. He denies any legal history but did use drugs and alcohol for quite a number of years in the past  Musculoskeletal: Strength & Muscle Tone: within normal limits Gait & Station: normal Patient leans: N/A  Psychiatric Specialty Exam: Depression         Past medical history includes anxiety.   Anxiety  Symptoms include nervous/anxious behavior.      Review of Systems  Eyes: Positive for blurred vision.  Musculoskeletal: Positive for back pain, joint pain and neck pain.  Psychiatric/Behavioral: Positive for depression. The patient is nervous/anxious.     Blood pressure (!) 166/94, pulse 98, height 5\' 11"  (1.803  m), weight 213 lb 9.6 oz (96.9 kg), SpO2 92 %.Body mass index is 29.79 kg/m.  General Appearance: Casual and Fairly Groomed  Eye Contact:  Good  Speech:  Clear and Coherent  Volume:  Normal  Mood:  Fairly good  Affect:  Brighter   Thought Process:  Circumstantial and Coherent  Orientation:  Full (Time, Place, and Person)  Thought Content:  Rumination  Suicidal Thoughts:  No  Homicidal Thoughts:  No  Memory:  Immediate;   Fair Recent;   Poor Remote;   Poor  Judgement:  Fair  Insight:  Lacking  Psychomotor Activity:  Normal  Concentration:  Fair  Recall:  Poor  Fund of Knowledge:Fair  Language: Good  Akathisia:  No  Handed:  Right  AIMS (if indicated):    Assets:  Communication Skills Desire for Improvement Resilience Social Support  ADL's:  Intact  Cognition: WNL  Sleep:  ok   Is the patient at risk to self?  No. Has the patient been a risk to self in the past 6 months?  No. Has the patient been a risk to self within the distant past?  No. Is the patient a risk to others?  No. Has the patient been a risk to others in the past 6 months?  No. Has the patient been a risk to others within the distant past?  No.  Allergies:   Allergies  Allergen Reactions  . Aspirin Other (See Comments)    Do not take with depakote.   Marland Kitchen Penicillins Rash   Current Medications: Current Outpatient Prescriptions  Medication Sig Dispense Refill  . aspirin 325 MG tablet Take 325 mg by mouth daily.    Marland Kitchen dexlansoprazole (DEXILANT) 60 MG capsule Take 60 mg by mouth every morning.     . divalproex (DEPAKOTE ER) 500 MG 24 hr tablet Take 1 tablet (500 mg total) by mouth at bedtime. 30 tablet 2  . Dutasteride-Tamsulosin HCl (JALYN) 0.5-0.4 MG CAPS Take 1 capsule by mouth daily at 12 noon.     Marland Kitchen HYDROcodone-acetaminophen (NORCO) 10-325 MG tablet Take 1-2 tablets by mouth every 6 (six) hours as needed.    Marland Kitchen ibuprofen (ADVIL,MOTRIN) 200 MG tablet  Take 800 mg by mouth every 8 (eight) hours as needed  for mild pain or moderate pain.    Marland Kitchen ipratropium (ATROVENT) 0.03 % nasal spray Place 2 sprays into both nostrils every 12 (twelve) hours as needed for rhinitis. Reported on 10/23/2015    . lamoTRIgine (LAMICTAL) 200 MG tablet Take 1 tablet (200 mg total) by mouth at bedtime. 30 tablet 2  . levalbuterol (XOPENEX HFA) 45 MCG/ACT inhaler Inhale 1-2 puffs into the lungs every 6 (six) hours as needed for wheezing.     Marland Kitchen lisdexamfetamine (VYVANSE) 70 MG capsule Take 1 capsule (70 mg total) by mouth every morning. 30 capsule 0  . montelukast (SINGULAIR) 10 MG tablet Take 10 mg by mouth every morning.     . promethazine (PHENERGAN) 25 MG tablet Take 25 mg by mouth every 6 (six) hours as needed for nausea or vomiting.    . tadalafil (CIALIS) 20 MG tablet Take 20 mg by mouth daily as needed for erectile dysfunction.    Marland Kitchen testosterone cypionate (DEPOTESTOTERONE CYPIONATE) 200 MG/ML injection Inject 200 mg into the muscle every 14 (fourteen) days.     Marland Kitchen lisdexamfetamine (VYVANSE) 70 MG capsule Take 1 capsule (70 mg total) by mouth daily. 30 capsule 0  . lisdexamfetamine (VYVANSE) 70 MG capsule Take 1 capsule (70 mg total) by mouth daily. 30 capsule 0   No current facility-administered medications for this visit.     Previous Psychotropic Medications: Yes   Substance Abuse History in the last 12 months:  No.  Consequences of Substance Abuse: NA  Medical Decision Making:  Established Problem, Stable/Improving (1), Review or order clinical lab tests (1), Review and summation of old records (2), Review or order medicine tests (1) and Review of Medication Regimen & Side Effects (2)  Treatment Plan Summary: Medication management   The patient will continue Vyvanse 70 mg every morning for ADHD, Lamictal 200 mg daily and Depakote ER 500 mg daily for mood stabilization.He will check a Depakote level today  He'll return in 3 months or call sooner if needed    Emalee Knies, [place] 12/15/201711:35 AM

## 2016-09-04 ENCOUNTER — Ambulatory Visit (INDEPENDENT_AMBULATORY_CARE_PROVIDER_SITE_OTHER): Payer: Commercial Managed Care - HMO | Admitting: Psychology

## 2016-09-04 DIAGNOSIS — F3162 Bipolar disorder, current episode mixed, moderate: Secondary | ICD-10-CM | POA: Diagnosis not present

## 2016-09-11 NOTE — Progress Notes (Signed)
Patient:  Philip Gates   DOB: 1956/06/26  MR Number: [id number]  Location: [facility] [address] [place] [address] Dept: [phone]  Start: 9AM End: 10 AM  Provider/Observer:     Edgardo Roys PSYD  Chief Complaint:      Chief Complaint  Patient presents with  . Agitation    Reason For Service:     The patient was referred by Dr. Harrington Challenger for psychotherapeutic interventions.  The patient has a history of mood disturbance as well as attentional issues when he was a teenager. I do think that it is likely overall explained by an underlying bipolar disorder. The patient is having ongoing conflicts at the present time that he felt would be beneficial if he got some counseling with. The patient reports that several years ago he had an affair with a woman that he hated when he was in high school. More recently, his wife and daughter found out about this affair that it occurred in the past but the patient is continued to have contact with this woman ongoing. The patient reports that he has tried to tell his wife that he is not continuing to have an affair but she is very angry and continues to ask him about antidepressant for details. The patient reports that he is unsure whether he wants to stay married to his wife are not. The patient has a history of being very controlling and knows that he made a mistake with the affair. He reports that his daughter has forgiven him for what transpired but he is not sure if his wife ever will. He is not sure if he is going to be able to put up with her constant and not trusting him.  Interventions Strategy:  Cognitive/behavioral psychotherapeutic interventions  Participation Level:   Active  Participation Quality:  Appropriate      Behavioral Observation:  Well Groomed, Alert, and Appropriate.   Current Psychosocial Factors: The patient and wife return And  worked on some of the significant complex been going on between the patient and his wife. The patient had a lot of questions about what bipolar affective disorder was in general  Content of Session:   Reviewed current symptoms and worked on Radiographer, therapeutic and strategies.  Current Status:   The patient reports that he has not been raising his voice is getting angry like he used to in the past. He knows that that is counterproductive. He reports that he is doing well in his psychotropic medications stable  the patient reports that he would like to continue to work on marital issues and does feel like he is making some efforts.  Patient Progress:   Stable  Target Goals:   Target goals include reducing anger responses and building better coping skills.  Last Reviewed:   09/04/2016  Goals Addressed Today:    Today we worked on Therapist, occupational and strategies.  Impression/Diagnosis:   The patient has a long history of agitated and impulsive behaviors. He felt that he had attention deficit disorder as a child/teenager but was formally diagnosed with bipolar disorder as an adult. He has had issues with substance use and abuse in the past but has not used for a very long time. He was seen by a psychiatrist in [place] who diagnosed with bipolar disorder and he was started on mood stabilizing medications. He responded well to these medications and continues to report that he responds  well to medications and interventions he is receiving from Dr. Harrington Challenger.  Diagnosis:    Axis I: Bipolar 1 disorder, mixed, moderate ([place])  Peretz Thieme R, PsyD 09/11/2016

## 2016-09-18 ENCOUNTER — Ambulatory Visit (HOSPITAL_COMMUNITY): Payer: Self-pay | Admitting: Psychology

## 2016-09-22 NOTE — Progress Notes (Signed)
Patient:  Philip Gates   DOB: Jan 31, 1956  MR Number: [id number]  Location: [facility] [address] [place] [address] Dept: [phone]  Start: 9AM End: 10 AM  Provider/Observer:     Edgardo Roys PSYD  Chief Complaint:      Chief Complaint  Patient presents with  . Agitation  . Stress    Reason For Service:     The patient was referred by Dr. Harrington Challenger for psychotherapeutic interventions.  The patient has a history of mood disturbance as well as attentional issues when he was a teenager. I do think that it is likely overall explained by an underlying bipolar disorder. The patient is having ongoing conflicts at the present time that he felt would be beneficial if he got some counseling with. The patient reports that several years ago he had an affair with a woman that he hated when he was in high school. More recently, his wife and daughter found out about this affair that it occurred in the past but the patient is continued to have contact with this woman ongoing. The patient reports that he has tried to tell his wife that he is not continuing to have an affair but she is very angry and continues to ask him about antidepressant for details. The patient reports that he is unsure whether he wants to stay married to his wife are not. The patient has a history of being very controlling and knows that he made a mistake with the affair. He reports that his daughter has forgiven him for what transpired but he is not sure if his wife ever will. He is not sure if he is going to be able to put up with her constant and not trusting him.  Interventions Strategy:  Cognitive/behavioral psychotherapeutic interventions  Participation Level:   Active  Participation Quality:  Appropriate      Behavioral Observation:  Well Groomed, Alert, and Appropriate.   Current Psychosocial Factors: The patient and wife  return And worked on some of the significant complex been going on between the patient and his wife. The patient had a lot of questions about what bipolar affective disorder was in general  Content of Session:   Reviewed current symptoms and worked on Radiographer, therapeutic and strategies.  Current Status:   The patient reports that he has not been raising his voice is getting angry like he used to in the past. He knows that that is counterproductive. He reports that he is doing well in his psychotropic medications stable  the patient reports that he would like to continue to work on marital issues and does feel like he is making some efforts.  Patient Progress:   Stable  Target Goals:   Target goals include reducing anger responses and building better coping skills.  Last Reviewed:   12/142017  Goals Addressed Today:    Today we worked on Therapist, occupational and strategies.  Impression/Diagnosis:   The patient has a long history of agitated and impulsive behaviors. He felt that he had attention deficit disorder as a child/teenager but was formally diagnosed with bipolar disorder as an adult. He has had issues with substance use and abuse in the past but has not used for a very long time. He was seen by a psychiatrist in [place] who diagnosed with bipolar disorder and he was started on mood stabilizing medications. He responded well to these medications and continues to report  that he responds well to medications and interventions he is receiving from Dr. Harrington Challenger.  Diagnosis:    Axis I: Bipolar 1 disorder, mixed, moderate ([place])  Teandra Harlan R, PsyD 09/22/2016

## 2016-11-03 ENCOUNTER — Encounter (INDEPENDENT_AMBULATORY_CARE_PROVIDER_SITE_OTHER): Payer: Self-pay | Admitting: Internal Medicine

## 2016-11-12 ENCOUNTER — Encounter (HOSPITAL_COMMUNITY): Payer: Self-pay | Admitting: Psychiatry

## 2016-11-12 ENCOUNTER — Ambulatory Visit (INDEPENDENT_AMBULATORY_CARE_PROVIDER_SITE_OTHER): Payer: Commercial Managed Care - HMO | Admitting: Psychiatry

## 2016-11-12 VITALS — BP 158/95 | HR 101 | Ht 71.0 in | Wt 212.8 lb

## 2016-11-12 DIAGNOSIS — F902 Attention-deficit hyperactivity disorder, combined type: Secondary | ICD-10-CM | POA: Diagnosis not present

## 2016-11-12 DIAGNOSIS — Z7982 Long term (current) use of aspirin: Secondary | ICD-10-CM

## 2016-11-12 DIAGNOSIS — Z818 Family history of other mental and behavioral disorders: Secondary | ICD-10-CM | POA: Diagnosis not present

## 2016-11-12 DIAGNOSIS — Z888 Allergy status to other drugs, medicaments and biological substances status: Secondary | ICD-10-CM

## 2016-11-12 DIAGNOSIS — F17210 Nicotine dependence, cigarettes, uncomplicated: Secondary | ICD-10-CM

## 2016-11-12 DIAGNOSIS — F3162 Bipolar disorder, current episode mixed, moderate: Secondary | ICD-10-CM | POA: Diagnosis not present

## 2016-11-12 DIAGNOSIS — Z880 Allergy status to penicillin: Secondary | ICD-10-CM

## 2016-11-12 DIAGNOSIS — Z79899 Other long term (current) drug therapy: Secondary | ICD-10-CM

## 2016-11-12 MED ORDER — LISDEXAMFETAMINE DIMESYLATE 70 MG PO CAPS: 70.0000 mg | ORAL_CAPSULE | Freq: Every morning | ORAL | 0 refills | Status: DC

## 2016-11-12 MED ORDER — LISDEXAMFETAMINE DIMESYLATE 70 MG PO CAPS: 70.0000 mg | ORAL_CAPSULE | Freq: Every day | ORAL | 0 refills | Status: DC

## 2016-11-12 MED ORDER — LAMOTRIGINE 200 MG PO TABS: 200.0000 mg | ORAL_TABLET | Freq: Every day | ORAL | 2 refills | Status: DC

## 2016-11-12 NOTE — Progress Notes (Signed)
Patient ID: Philip Gates, male   DOB: March 04, 1956, 61 y.o.   MRN: [id number] Patient ID: Philip Gates, male   DOB: 25-Aug-1956, 61 y.o.   MRN: [id number]  Psychiatric Initial Adult Assessment   Patient Identification: Philip Gates MRN:  [id number] Date of Evaluation:  11/12/2016 Referral Source: Dr. Nevada Crane Chief Complaint:   Chief Complaint    Anxiety; Depression; ADHD; Follow-up     Visit Diagnosis:    ICD-9-CM ICD-10-CM   1. Bipolar 1 disorder, mixed, moderate (HCC) 296.62 F31.62   2. Attention deficit hyperactivity disorder (ADHD), combined type 314.01 F90.2    Diagnosis:   Patient Active Problem List   Diagnosis Date Noted  . Varicose veins of right lower extremity with complications [J03.159] 45/85/9292  . Bipolar 1 disorder, mixed, moderate ([place]) [F31.62] 10/12/2015  . Attention deficit hyperactivity disorder (ADHD) [F90.9] 10/12/2015  . Chronic venous insufficiency [I87.2] 06/25/2015  . SBO (small bowel obstruction) [K56.609] 01/16/2015  . Cerebral aneurysm [I67.1] 12/29/2014  . OA (osteoarthritis) of knee [M17.10] 10/06/2014  . Facial cellulitis [K46.286] 05/31/2014  . Asthma [J45.909]   . GERD (gastroesophageal reflux disease) [K21.9]   . Bipolar 1 disorder (Philip Gates) [F31.9]   . OA (osteoarthritis) of hip [M16.9] 01/03/2013   History of Present Illness:  This patient is a 61 year old married white male lives with his wife in [place]. 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  The patient was referred by his primary physician, Dr. Wende Neighbors, for further assessment and treatment of bipolar disorder and ADHD.  The patient is a vague historian and he tends to ramble a lot. Apparently as a child he was very unfocused and hyperactive. He is always had difficulty completing tasks. He was never diagnosed with ADHD in childhood and he was able to complete high school and 1 year of machine shop training.  He has worked in numerous jobs in Film/video editor.  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 He quit all this about 10 years ago. About 7 years ago he became increasingly angry and moody and irritable. Some of it started when he and his wife are building a house and he felt like the contractor "screwed me out of $100,000.". He began seeing Dr. Candis Schatz in [place] and was diagnosed as being bipolar because he had significant mood swings anger and irritability. Since that he's been on a combination of Lamictal and Depakote which he thinks has helped. Because of his long-term difficulties with focus and concentration he was started on Vyvanse which she also thinks has helped.  The patient still has some difficulties with irritability but not like he used to. He and his wife don't get along but he admits that he was having an affair with an old girlfriend for the last few years and still talks to this woman periodically. His wife found out about it and is quite resentful. He also has significant memory problems and often loses his train of thought. He denies any head injuries. 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 He has numerous medical problems and has chronic pain from arthritis. Last year he also had knee surgery and is well as a small bowel obstruction.  The patient no longer uses drugs or alcohol. He is active in church and does a lot of things with his cousin. His energy is  pretty good and he states he sleeps fairly well. He has never had psychiatric hospitalization or treatment for drug or alcohol abuse  The patient returns after 3 months. He states that he is still doing pretty well.he and his wife are building a new house together and they getting along better. He still would like to get off one of the medications and his Depakote level is quite low so he may as well get off this and continue on Lamictal.the Vyvanse is  helped tremendously with his focus  Elements:  Location:  Global. Quality:  Stable. Severity:  Moderate to severe. Timing:  Daily. Duration:  Several years. Context:  Conflicts with wife. Associated Signs/Symptoms: Depression Symptoms:  depressed mood, psychomotor agitation, difficulty concentrating, anxiety, (Hypo) Manic Symptoms:  Distractibility, Irritable Mood, Labiality of Mood,  Past Medical History:  Past Medical History:  Diagnosis Date  . Acid reflux   . 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 Arthritis   . Asthma    ACTIVITY INDUCED  . Bipolar 1 disorder (Philip Gates)   . BPH (benign prostatic hyperplasia)   . Cellulitis 05/2014   right side face  . Cerebral aneurysm   . GERD (gastroesophageal reflux disease)   . Neck pain, chronic   . Sinus drainage   . 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  . Venous insufficiency     Past Surgical History:  Procedure Laterality Date  . cerebral stent    . dental implant  09/26/2014   left lower dental implant  . EYE SURGERY Left Jan. 16, 2017   Cataract  . JOINT REPLACEMENT Right    Knee  . JOINT REPLACEMENT Left    Hip  . NOSE SURGERY    . RADIOLOGY WITH ANESTHESIA N/A 12/29/2014   Procedure: Embolization;  Surgeon: Consuella Lose, MD;  Location: [facility];  Service: Radiology;  Laterality: N/A;  . SUBCLAVIAN ANGIOGRAM  Nov. 8, 2016  . TOTAL HIP ARTHROPLASTY Left 01/03/2013   Procedure: TOTAL HIP ARTHROPLASTY ANTERIOR APPROACH;  Surgeon: Gearlean Alf, MD;  Location: [facility];  Service: Orthopedics;  Laterality: Left;  . TOTAL KNEE ARTHROPLASTY Right 10/06/2014   Procedure: RIGHT TOTAL KNEE ARTHROPLASTY;  Surgeon: Gearlean Alf, MD;  Location: [facility];  Service: Orthopedics;  Laterality: Right;   Family History:  Family History  Problem Relation Age of Onset  . Arrhythmia Mother     Has pacemaker  . Hypertension Mother   . Heart disease Mother   .  Mental illness Mother   . Varicose Veins Mother   . Depression Mother   . Colon cancer Father   . Cancer Father     Prostate and Colon  . Diabetes Father   . Hypertension Father   . Depression Sister    Social History:   Social History   Social History  . Marital status: Married    Spouse name: N/A  . Number of children: N/A  . Years of education: N/A   Social History Main Topics  . Smoking status: Current Every Day Smoker    Packs/day: 0.50    Types: Cigarettes    Start date: 05/23/2015  . Smokeless tobacco: Former Systems developer     Comment: Stated "I smoke off and on"  . Alcohol use No     Comment: 10-12-15 per pt no  . Drug use: No     Comment: 10-12-15 per tp no  .  Sexual activity: Yes   Other Topics Concern  . None   Social History Narrative  . None   Additional Social History: The patient grew up in [place] with both parents and younger sister. 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 He has been married to the same woman for over 70 years and has 2 grown daughters and 5 grandchildren. He denies any legal history but did use drugs and alcohol for quite a number of years in the past  Musculoskeletal: Strength & Muscle Tone: within normal limits Gait & Station: normal Patient leans: N/A  Psychiatric Specialty Exam: Depression         Past medical history includes anxiety.   Anxiety  Symptoms include nervous/anxious behavior.      Review of Systems  Eyes: Positive for blurred vision.  Musculoskeletal: Positive for back pain, joint pain and neck pain.  Psychiatric/Behavioral: Positive for depression. The patient is nervous/anxious.     Blood pressure (!) 158/95, pulse (!) 101, height 5\' 11"  (1.803 m), weight 212 lb 12.8 oz (96.5 kg).Body mass index is 29.68 kg/m.  General Appearance: Casual and Fairly Groomed  Eye Contact:  Good  Speech:  Clear and Coherent  Volume:   Normal  Mood:  Fairly good  Affect:  Bright  Thought Process:  Circumstantial and Coherent  Orientation:  Full (Time, Place, and Person)  Thought Content:  Rumination  Suicidal Thoughts:  No  Homicidal Thoughts:  No  Memory:  Immediate;   Fair Recent;   Poor Remote;   Poor  Judgement:  Fair  Insight:  Lacking  Psychomotor Activity:  Normal  Concentration:  Fair  Recall:  Poor  Fund of Knowledge:Fair  Language: Good  Akathisia:  No  Handed:  Right  AIMS (if indicated):    Assets:  Communication Skills Desire for Improvement Resilience Social Support  ADL's:  Intact  Cognition: WNL  Sleep:  ok   Is the patient at risk to self?  No. Has the patient been a risk to self in the past 6 months?  No. Has the patient been a risk to self within the distant past?  No. Is the patient a risk to others?  No. Has the patient been a risk to others in the past 6 months?  No. Has the patient been a risk to others within the distant past?  No.  Allergies:   Allergies  Allergen Reactions  . Aspirin Other (See Comments)    Do not take with depakote.   Marland Kitchen Penicillins Rash   Current Medications: Current Outpatient Prescriptions  Medication Sig Dispense Refill  . aspirin 325 MG tablet Take 325 mg by mouth daily.    Marland Kitchen dexlansoprazole (DEXILANT) 60 MG capsule Take 60 mg by mouth every morning.     . Dutasteride-Tamsulosin HCl (JALYN) 0.5-0.4 MG CAPS Take 1 capsule by mouth daily at 12 noon.     Marland Kitchen HYDROcodone-acetaminophen (NORCO) 10-325 MG tablet Take 1-2 tablets by mouth every 6 (six) hours as needed.    Marland Kitchen ibuprofen (ADVIL,MOTRIN) 200 MG tablet Take 800 mg by mouth every 8 (eight) hours as needed for mild pain or moderate pain.    Marland Kitchen ipratropium (ATROVENT) 0.03 % nasal spray Place 2 sprays into both nostrils every 12 (twelve) hours as needed for rhinitis. Reported on 10/23/2015    . lamoTRIgine (LAMICTAL) 200 MG tablet Take 1 tablet (200 mg  total) by mouth at bedtime. 30 tablet 2  .  levalbuterol (XOPENEX HFA) 45 MCG/ACT inhaler Inhale 1-2 puffs into the lungs every 6 (six) hours as needed for wheezing.     Marland Kitchen lisdexamfetamine (VYVANSE) 70 MG capsule Take 1 capsule (70 mg total) by mouth every morning. 30 capsule 0  . montelukast (SINGULAIR) 10 MG tablet Take 10 mg by mouth every morning.     . promethazine (PHENERGAN) 25 MG tablet Take 25 mg by mouth every 6 (six) hours as needed for nausea or vomiting.    . tadalafil (CIALIS) 20 MG tablet Take 20 mg by mouth daily as needed for erectile dysfunction.    Marland Kitchen testosterone cypionate (DEPOTESTOTERONE CYPIONATE) 200 MG/ML injection Inject 200 mg into the muscle every 14 (fourteen) days.     Marland Kitchen lisdexamfetamine (VYVANSE) 70 MG capsule Take 1 capsule (70 mg total) by mouth daily. 30 capsule 0  . lisdexamfetamine (VYVANSE) 70 MG capsule Take 1 capsule (70 mg total) by mouth daily. 30 capsule 0   No current facility-administered medications for this visit.     Previous Psychotropic Medications: Yes   Substance Abuse History in the last 12 months:  No.  Consequences of Substance Abuse: NA  Medical Decision Making:  Established Problem, Stable/Improving (1), Review or order clinical lab tests (1), Review and summation of old records (2), Review or order medicine tests (1) and Review of Medication Regimen & Side Effects (2)  Treatment Plan Summary: Medication management   The patient will continue Vyvanse 70 mg every morning for ADHD, Lamictal 200 mg daily. He will discontinue Depakote but if problems arise from discontinuation UL call me immediately  He'll return in 3 months or call sooner if needed    [place], [facility] 3/7/20181:34 PM

## 2016-11-13 ENCOUNTER — Encounter (INDEPENDENT_AMBULATORY_CARE_PROVIDER_SITE_OTHER): Payer: Self-pay | Admitting: *Deleted

## 2016-11-13 ENCOUNTER — Ambulatory Visit (INDEPENDENT_AMBULATORY_CARE_PROVIDER_SITE_OTHER): Payer: Commercial Managed Care - HMO | Admitting: Internal Medicine

## 2016-11-13 ENCOUNTER — Other Ambulatory Visit (INDEPENDENT_AMBULATORY_CARE_PROVIDER_SITE_OTHER): Payer: Self-pay | Admitting: Internal Medicine

## 2016-11-13 ENCOUNTER — Encounter (INDEPENDENT_AMBULATORY_CARE_PROVIDER_SITE_OTHER): Payer: Self-pay | Admitting: Internal Medicine

## 2016-11-13 VITALS — BP 160/90 | HR 84 | Temp 97.6°F | Ht 71.0 in | Wt 216.2 lb

## 2016-11-13 DIAGNOSIS — Z800 Family history of malignant neoplasm of digestive organs: Secondary | ICD-10-CM

## 2016-11-13 DIAGNOSIS — D508 Other iron deficiency anemias: Secondary | ICD-10-CM

## 2016-11-13 DIAGNOSIS — D649 Anemia, unspecified: Secondary | ICD-10-CM | POA: Insufficient documentation

## 2016-11-13 NOTE — Telephone Encounter (Signed)
Error   This encounter was created in error - please disregard. 

## 2016-11-13 NOTE — Patient Instructions (Signed)
The risks and benefits such as perforation, bleeding, and infection were reviewed with the patient and is agreeable. 

## 2016-11-13 NOTE — Progress Notes (Addendum)
Subjective:    Patient ID: Philip Gates, male    DOB: 1956-07-14, 61 y.o.   MRN: [id number]  HPI  Referred by Dr. Allyn Kenner for anemia. He denies seeing any blood in his stool. He has not lost any weight. His appetite is good. No abdominal pain.  He has a BM daily.  Family hx of colon cancer in a father 37 yrs old . He is doing good.  His last colonoscopy was 14 yrs ago and he reports it was normal.   Patient is typing on phone the entire visit.    10/24/2016 H and H 12.0 and 38.5, MCV 82. Total bili 0.3, ALP 56, AST 23, ALT 28. HA1C 5.6    Review of Systems Past Medical History:  Diagnosis Date  . Acid reflux   . 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 Arthritis   . Asthma    ACTIVITY INDUCED  . Bipolar 1 disorder ([place])   . BPH (benign prostatic hyperplasia)   . Cellulitis 05/2014   right side face  . Cerebral aneurysm   . GERD (gastroesophageal reflux disease)   . Neck pain, chronic   . Sinus drainage   . 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  . Venous insufficiency    Current Outpatient Prescriptions  Medication Sig Dispense Refill  . aspirin 325 MG tablet Take 325 mg by mouth daily.    Marland Kitchen dexlansoprazole (DEXILANT) 60 MG capsule Take 60 mg by mouth every morning.     . Dutasteride-Tamsulosin HCl (JALYN) 0.5-0.4 MG CAPS Take 1 capsule by mouth daily at 12 noon.     . Fe Fum-Fe Poly-Vit C-Lactobac (FUSION PO) Take by mouth.    Marland Kitchen ibuprofen (ADVIL,MOTRIN) 200 MG tablet Take 800 mg by mouth every 8 (eight) hours as needed for mild pain or moderate pain.    Marland Kitchen ipratropium (ATROVENT) 0.03 % nasal spray Place 2 sprays into both nostrils every 12 (twelve) hours as needed for rhinitis. Reported on 10/23/2015    . lamoTRIgine (LAMICTAL) 200 MG tablet Take 1 tablet (200 mg total) by mouth at bedtime. 30 tablet 2  . lisdexamfetamine (VYVANSE) 70 MG capsule Take 1 capsule (70 mg total) by mouth every  morning. 30 capsule 0  . montelukast (SINGULAIR) 10 MG tablet Take 10 mg by mouth every morning.     . Multiple Vitamin (MULTIVITAMIN) LIQD Take 5 mLs by mouth daily.    . promethazine (PHENERGAN) 25 MG tablet Take 25 mg by mouth every 6 (six) hours as needed for nausea or vomiting.    . tadalafil (CIALIS) 20 MG tablet Take 20 mg by mouth daily as needed for erectile dysfunction.    Marland Kitchen testosterone cypionate (DEPOTESTOTERONE CYPIONATE) 200 MG/ML injection Inject 200 mg into the muscle every 14 (fourteen) days.      No current facility-administered medications for this visit.              Objective:   Physical Exam Blood pressure (!) 160/90, pulse 84, temperature 97.6 F (36.4 C), height 5\' 11"  (1.803 m), weight 216 lb 3.2 oz (98.1 kg). Alert and oriented. Skin warm and dry. Oral mucosa is moist.   . Sclera anicteric, conjunctivae is pink. Thyroid not enlarged. No cervical lymphadenopathy. Lungs clear. Heart regular rate and rhythm.  Abdomen is soft. Bowel sounds are positive. No hepatomegaly. No abdominal masses felt. No tenderness.  No edema  to lower extremities.           Assessment & Plan:  Anemia: Am going to get iron studies. Family hx of colon cancer. Colonoscopy. The risks and benefits such as perforation, bleeding, and infection were reviewed with the patient and is agreeable.

## 2016-11-14 ENCOUNTER — Encounter (INDEPENDENT_AMBULATORY_CARE_PROVIDER_SITE_OTHER): Payer: Self-pay

## 2016-11-17 ENCOUNTER — Other Ambulatory Visit (INDEPENDENT_AMBULATORY_CARE_PROVIDER_SITE_OTHER): Payer: Self-pay | Admitting: Internal Medicine

## 2016-11-18 ENCOUNTER — Encounter (INDEPENDENT_AMBULATORY_CARE_PROVIDER_SITE_OTHER): Payer: Self-pay

## 2016-11-18 ENCOUNTER — Telehealth (INDEPENDENT_AMBULATORY_CARE_PROVIDER_SITE_OTHER): Payer: Self-pay | Admitting: Internal Medicine

## 2016-11-18 LAB — IRON AND TIBC
IRON: 300 ug/dL — AB (ref 38–169)
Iron Saturation: 74 % — ABNORMAL HIGH (ref 15–55)
Total Iron Binding Capacity: 407 ug/dL (ref 250–450)
UIBC: 107 ug/dL — ABNORMAL LOW (ref 111–343)

## 2016-11-18 LAB — FERRITIN: Ferritin: 27 ng/mL — ABNORMAL LOW (ref 30–400)

## 2016-11-18 NOTE — Telephone Encounter (Signed)
Results given to patient. He is scheduled for a colonoscopy next month

## 2016-11-20 ENCOUNTER — Ambulatory Visit (HOSPITAL_COMMUNITY): Payer: Self-pay | Admitting: Psychiatry

## 2016-12-01 ENCOUNTER — Telehealth (INDEPENDENT_AMBULATORY_CARE_PROVIDER_SITE_OTHER): Payer: Self-pay | Admitting: Internal Medicine

## 2016-12-01 ENCOUNTER — Other Ambulatory Visit (INDEPENDENT_AMBULATORY_CARE_PROVIDER_SITE_OTHER): Payer: Self-pay | Admitting: Internal Medicine

## 2016-12-01 DIAGNOSIS — D508 Other iron deficiency anemias: Secondary | ICD-10-CM

## 2016-12-01 NOTE — Telephone Encounter (Signed)
EGD/Colonoscopy

## 2016-12-02 NOTE — Telephone Encounter (Signed)
EGD added to TCS, patient aware 

## 2017-01-01 DIAGNOSIS — Z833 Family history of diabetes mellitus: Secondary | ICD-10-CM | POA: Diagnosis not present

## 2017-01-01 DIAGNOSIS — K449 Diaphragmatic hernia without obstruction or gangrene: Secondary | ICD-10-CM | POA: Diagnosis not present

## 2017-01-01 DIAGNOSIS — Z8249 Family history of ischemic heart disease and other diseases of the circulatory system: Secondary | ICD-10-CM | POA: Diagnosis not present

## 2017-01-01 DIAGNOSIS — K228 Other specified diseases of esophagus: Secondary | ICD-10-CM | POA: Diagnosis not present

## 2017-01-01 DIAGNOSIS — K219 Gastro-esophageal reflux disease without esophagitis: Secondary | ICD-10-CM | POA: Diagnosis not present

## 2017-01-01 DIAGNOSIS — N400 Enlarged prostate without lower urinary tract symptoms: Secondary | ICD-10-CM | POA: Diagnosis not present

## 2017-01-01 DIAGNOSIS — Z886 Allergy status to analgesic agent status: Secondary | ICD-10-CM | POA: Diagnosis not present

## 2017-01-01 DIAGNOSIS — Z96642 Presence of left artificial hip joint: Secondary | ICD-10-CM | POA: Diagnosis not present

## 2017-01-01 DIAGNOSIS — Z807 Family history of other malignant neoplasms of lymphoid, hematopoietic and related tissues: Secondary | ICD-10-CM | POA: Diagnosis not present

## 2017-01-01 DIAGNOSIS — F17210 Nicotine dependence, cigarettes, uncomplicated: Secondary | ICD-10-CM | POA: Diagnosis not present

## 2017-01-01 DIAGNOSIS — F319 Bipolar disorder, unspecified: Secondary | ICD-10-CM | POA: Diagnosis not present

## 2017-01-01 DIAGNOSIS — M1990 Unspecified osteoarthritis, unspecified site: Secondary | ICD-10-CM | POA: Diagnosis not present

## 2017-01-01 DIAGNOSIS — Z96651 Presence of right artificial knee joint: Secondary | ICD-10-CM | POA: Diagnosis not present

## 2017-01-01 DIAGNOSIS — Z880 Allergy status to penicillin: Secondary | ICD-10-CM | POA: Diagnosis not present

## 2017-01-01 DIAGNOSIS — Z01812 Encounter for preprocedural laboratory examination: Secondary | ICD-10-CM | POA: Diagnosis not present

## 2017-01-01 DIAGNOSIS — Z79899 Other long term (current) drug therapy: Secondary | ICD-10-CM | POA: Diagnosis not present

## 2017-01-01 DIAGNOSIS — Z800 Family history of malignant neoplasm of digestive organs: Secondary | ICD-10-CM | POA: Diagnosis not present

## 2017-01-01 DIAGNOSIS — Z8042 Family history of malignant neoplasm of prostate: Secondary | ICD-10-CM | POA: Diagnosis not present

## 2017-01-01 DIAGNOSIS — D509 Iron deficiency anemia, unspecified: Secondary | ICD-10-CM | POA: Diagnosis not present

## 2017-01-01 DIAGNOSIS — K644 Residual hemorrhoidal skin tags: Secondary | ICD-10-CM | POA: Diagnosis not present

## 2017-01-01 DIAGNOSIS — I872 Venous insufficiency (chronic) (peripheral): Secondary | ICD-10-CM | POA: Diagnosis not present

## 2017-01-02 ENCOUNTER — Encounter (HOSPITAL_COMMUNITY)
Admission: RE | Disposition: A | Discharge status: Home / Self Care | Payer: Self-pay | Source: Ambulatory Visit | Attending: Internal Medicine

## 2017-01-02 ENCOUNTER — Ambulatory Visit (HOSPITAL_COMMUNITY): Admit: 2017-01-02 | Payer: Commercial Managed Care - HMO | Admitting: Internal Medicine

## 2017-01-02 ENCOUNTER — Ambulatory Visit (HOSPITAL_COMMUNITY)
Admission: RE | Admit: 2017-01-02 | Discharge: 2017-01-02 | Disposition: A | Discharge status: Home / Self Care | Payer: Commercial Managed Care - HMO | Source: Ambulatory Visit | Attending: Internal Medicine | Admitting: Internal Medicine

## 2017-01-02 ENCOUNTER — Encounter (HOSPITAL_COMMUNITY): Payer: Self-pay | Admitting: *Deleted

## 2017-01-02 ENCOUNTER — Encounter (HOSPITAL_COMMUNITY): Payer: Self-pay

## 2017-01-02 DIAGNOSIS — I872 Venous insufficiency (chronic) (peripheral): Secondary | ICD-10-CM | POA: Insufficient documentation

## 2017-01-02 DIAGNOSIS — K644 Residual hemorrhoidal skin tags: Secondary | ICD-10-CM | POA: Diagnosis not present

## 2017-01-02 DIAGNOSIS — Z96642 Presence of left artificial hip joint: Secondary | ICD-10-CM | POA: Insufficient documentation

## 2017-01-02 DIAGNOSIS — N400 Enlarged prostate without lower urinary tract symptoms: Secondary | ICD-10-CM | POA: Insufficient documentation

## 2017-01-02 DIAGNOSIS — K228 Other specified diseases of esophagus: Secondary | ICD-10-CM | POA: Diagnosis not present

## 2017-01-02 DIAGNOSIS — Z8042 Family history of malignant neoplasm of prostate: Secondary | ICD-10-CM | POA: Insufficient documentation

## 2017-01-02 DIAGNOSIS — D509 Iron deficiency anemia, unspecified: Secondary | ICD-10-CM | POA: Diagnosis not present

## 2017-01-02 DIAGNOSIS — Z96651 Presence of right artificial knee joint: Secondary | ICD-10-CM | POA: Insufficient documentation

## 2017-01-02 DIAGNOSIS — D649 Anemia, unspecified: Secondary | ICD-10-CM | POA: Insufficient documentation

## 2017-01-02 DIAGNOSIS — Z833 Family history of diabetes mellitus: Secondary | ICD-10-CM | POA: Insufficient documentation

## 2017-01-02 DIAGNOSIS — Z800 Family history of malignant neoplasm of digestive organs: Secondary | ICD-10-CM | POA: Insufficient documentation

## 2017-01-02 DIAGNOSIS — Z886 Allergy status to analgesic agent status: Secondary | ICD-10-CM | POA: Insufficient documentation

## 2017-01-02 DIAGNOSIS — Z01812 Encounter for preprocedural laboratory examination: Secondary | ICD-10-CM | POA: Insufficient documentation

## 2017-01-02 DIAGNOSIS — Z8249 Family history of ischemic heart disease and other diseases of the circulatory system: Secondary | ICD-10-CM | POA: Insufficient documentation

## 2017-01-02 DIAGNOSIS — K449 Diaphragmatic hernia without obstruction or gangrene: Secondary | ICD-10-CM

## 2017-01-02 DIAGNOSIS — F319 Bipolar disorder, unspecified: Secondary | ICD-10-CM | POA: Insufficient documentation

## 2017-01-02 DIAGNOSIS — F17210 Nicotine dependence, cigarettes, uncomplicated: Secondary | ICD-10-CM | POA: Insufficient documentation

## 2017-01-02 DIAGNOSIS — D508 Other iron deficiency anemias: Secondary | ICD-10-CM

## 2017-01-02 DIAGNOSIS — Z807 Family history of other malignant neoplasms of lymphoid, hematopoietic and related tissues: Secondary | ICD-10-CM | POA: Insufficient documentation

## 2017-01-02 DIAGNOSIS — K219 Gastro-esophageal reflux disease without esophagitis: Secondary | ICD-10-CM | POA: Insufficient documentation

## 2017-01-02 DIAGNOSIS — Z880 Allergy status to penicillin: Secondary | ICD-10-CM | POA: Insufficient documentation

## 2017-01-02 DIAGNOSIS — M1990 Unspecified osteoarthritis, unspecified site: Secondary | ICD-10-CM | POA: Insufficient documentation

## 2017-01-02 DIAGNOSIS — Z79899 Other long term (current) drug therapy: Secondary | ICD-10-CM | POA: Insufficient documentation

## 2017-01-02 HISTORY — PX: ESOPHAGOGASTRODUODENOSCOPY: SHX5428

## 2017-01-02 HISTORY — PX: COLONOSCOPY: SHX5424

## 2017-01-02 LAB — IRON AND TIBC
Iron: 127 ug/dL (ref 45–182)
Saturation Ratios: 33 % (ref 17.9–39.5)
TIBC: 386 ug/dL (ref 250–450)
UIBC: 259 ug/dL

## 2017-01-02 LAB — HEMOGLOBIN AND HEMATOCRIT, BLOOD
HEMATOCRIT: 45.5 % (ref 39.0–52.0)
HEMOGLOBIN: 15.7 g/dL (ref 13.0–17.0)

## 2017-01-02 SURGERY — EGD (ESOPHAGOGASTRODUODENOSCOPY)
Anesthesia: Moderate Sedation

## 2017-01-02 SURGERY — COLONOSCOPY
Anesthesia: Moderate Sedation

## 2017-01-02 MED ORDER — MIDAZOLAM HCL 5 MG/5ML IJ SOLN
INTRAMUSCULAR | Status: AC
  Filled 2017-01-02: qty 10

## 2017-01-02 MED ORDER — PROMETHAZINE HCL 25 MG/ML IJ SOLN
INTRAMUSCULAR | Status: DC | PRN
  Administered 2017-01-02: 6.25 mg via INTRAVENOUS

## 2017-01-02 MED ORDER — LIDOCAINE VISCOUS 2 % MT SOLN
OROMUCOSAL | Status: DC | PRN
  Administered 2017-01-02: 1 via OROMUCOSAL

## 2017-01-02 MED ORDER — LIDOCAINE VISCOUS 2 % MT SOLN
OROMUCOSAL | Status: AC
  Filled 2017-01-02: qty 15

## 2017-01-02 MED ORDER — MEPERIDINE HCL 50 MG/ML IJ SOLN
INTRAMUSCULAR | Status: AC
  Filled 2017-01-02: qty 1

## 2017-01-02 MED ORDER — SODIUM CHLORIDE 0.9 % IV SOLN
INTRAVENOUS | Status: DC
  Administered 2017-01-02: 09:00:00 via INTRAVENOUS

## 2017-01-02 MED ORDER — FLINTSTONES PLUS IRON PO CHEW: 1.0000 | CHEWABLE_TABLET | Freq: Two times a day (BID) | ORAL | Status: DC

## 2017-01-02 MED ORDER — MEPERIDINE HCL 50 MG/ML IJ SOLN
INTRAMUSCULAR | Status: DC | PRN
  Administered 2017-01-02 (×2): 25 mg via INTRAVENOUS

## 2017-01-02 MED ORDER — MIDAZOLAM HCL 5 MG/5ML IJ SOLN
INTRAMUSCULAR | Status: DC | PRN
  Administered 2017-01-02: 3 mg via INTRAVENOUS
  Administered 2017-01-02: 2 mg via INTRAVENOUS

## 2017-01-02 MED ORDER — STERILE WATER FOR IRRIGATION IR SOLN
Status: DC | PRN
  Administered 2017-01-02: 2.5 mL

## 2017-01-02 MED ORDER — PROMETHAZINE HCL 25 MG/ML IJ SOLN
INTRAMUSCULAR | Status: AC
  Filled 2017-01-02: qty 1

## 2017-01-02 NOTE — H&P (Signed)
Philip Gates is an 61 y.o. male.   Chief Complaint: Patient is here for EGD and colonoscopy. HPI: Patient is 61 year old Caucasian male who was recently found to have anemia with low normal MCV. He has chronic GERD. He is on PPI with satisfactory control of heartburn. He denies dysphagia abdominal pain melena or frank rectal bleeding. He may notice blood in the tissue if he has multiple bowel movements on a given day. He is on full dose aspirin and he also takes ibuprofen 800 mg 3 times a day. There is no history of peptic ulcer disease. Iron studies reveal low serum ferritin. Serum iron was reportedly be 300 mcg/dL this is most likely a false reading. He had normal colonoscopy 14 years ago. Family history is negative for CRC but his father has history of ulcerative colitis and also treated for mantle cell lymphoma of the colon  Past Medical History:  Diagnosis Date  . Acid reflux   . 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 Arthritis   . Asthma    ACTIVITY INDUCED  . Bipolar 1 disorder ([place])   . BPH (benign prostatic hyperplasia)   . Cellulitis 05/2014   right side face  . Cerebral aneurysm    has a stent  . GERD (gastroesophageal reflux disease)   . Neck pain, chronic   . Sinus drainage   . 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  . Venous insufficiency     Past Surgical History:  Procedure Laterality Date  . cerebral stent    . dental implant  09/26/2014   left lower dental implant  . EYE SURGERY Left Jan. 16, 2017   Cataract  . JOINT REPLACEMENT Right    Knee  . JOINT REPLACEMENT Left    Hip  . NOSE SURGERY    . RADIOLOGY WITH ANESTHESIA N/A 12/29/2014   Procedure: Embolization;  Surgeon: Consuella Lose, MD;  Location: [facility];  Service: Radiology;  Laterality: N/A;  . SUBCLAVIAN ANGIOGRAM  Nov. 8, 2016  . TOTAL HIP ARTHROPLASTY Left 01/03/2013   Procedure: TOTAL HIP ARTHROPLASTY ANTERIOR APPROACH;   Surgeon: Gearlean Alf, MD;  Location: [facility];  Service: Orthopedics;  Laterality: Left;  . TOTAL KNEE ARTHROPLASTY Right 10/06/2014   Procedure: RIGHT TOTAL KNEE ARTHROPLASTY;  Surgeon: Gearlean Alf, MD;  Location: [facility];  Service: Orthopedics;  Laterality: Right;    Family History  Problem Relation Age of Onset  . Arrhythmia Mother     Has pacemaker  . Hypertension Mother   . Heart disease Mother   . Mental illness Mother   . Varicose Veins Mother   . Depression Mother   .     Marland Kitchen Cancer Father     Prostate and Colon  . Diabetes Father   . Hypertension Father   . Mantle cell lymphoma of Colon Father   . Depression Sister    Social History:  reports that he has been smoking Cigarettes.  He started smoking about 19 months ago. He has been smoking about 0.50 packs per day. He has quit using smokeless tobacco. He reports that he does not drink alcohol or use drugs.  Allergies:  Allergies  Allergen Reactions  . Aspirin Other (See Comments)    Do not take with depakote.   . Erythromycin     Caused a "twisted stomach"  . Penicillins Rash    Medications Prior to  Admission  Medication Sig Dispense Refill  . aspirin 325 MG tablet Take 325 mg by mouth daily.    Marland Kitchen dexlansoprazole (DEXILANT) 60 MG capsule Take 60 mg by mouth every morning.     . Dutasteride-Tamsulosin HCl (JALYN) 0.5-0.4 MG CAPS Take 1 capsule by mouth daily at 12 noon.     Marland Kitchen HYDROcodone-acetaminophen (NORCO) 10-325 MG tablet Take 1 tablet by mouth every 6 (six) hours as needed for moderate pain.    Marland Kitchen ibuprofen (ADVIL,MOTRIN) 200 MG tablet Take 800 mg by mouth every 8 (eight) hours as needed for mild pain or moderate pain.    Marland Kitchen lamoTRIgine (LAMICTAL) 200 MG tablet Take 1 tablet (200 mg total) by mouth at bedtime. 30 tablet 2  . levalbuterol (XOPENEX HFA) 45 MCG/ACT inhaler Inhale 2 puffs into the lungs every 6 (six) hours as needed for wheezing.    Marland Kitchen levocetirizine (XYZAL) 5 MG tablet Take 1 tablet by mouth at  bedtime.    Marland Kitchen lisdexamfetamine (VYVANSE) 70 MG capsule Take 1 capsule (70 mg total) by mouth every morning. 30 capsule 0  . montelukast (SINGULAIR) 10 MG tablet Take 10 mg by mouth every morning.     . Multiple Vitamins-Minerals (MULTIVITAMIN WITH MINERALS) tablet Take 1 tablet by mouth daily.    . promethazine (PHENERGAN) 25 MG tablet Take 25 mg by mouth every 6 (six) hours as needed for nausea or vomiting.    . ranitidine (ZANTAC) 150 MG capsule Take 150 mg by mouth daily as needed for heartburn.    . tadalafil (CIALIS) 20 MG tablet Take 20 mg by mouth daily as needed for erectile dysfunction.    Marland Kitchen testosterone cypionate (DEPOTESTOTERONE CYPIONATE) 200 MG/ML injection Inject 200 mg into the muscle every 14 (fourteen) days.     Marland Kitchen ipratropium (ATROVENT) 0.03 % nasal spray Place 2 sprays into both nostrils every 12 (twelve) hours as needed for rhinitis. Reported on 10/23/2015      No results found for this or any previous visit (from the past 48 hour(s)). No results found.  ROS  Blood pressure (!) 150/102, pulse 87, temperature 97.6 F (36.4 C), temperature source Oral, resp. rate 18, height 5\' 11"  (1.803 m), weight 216 lb (98 kg), SpO2 98 %. Physical Exam  Constitutional: He appears well-developed and well-nourished.  HENT:  Mouth/Throat: Oropharynx is clear and moist.  Eyes: Conjunctivae are normal. No scleral icterus.  Neck: No thyromegaly present.  Cardiovascular: Normal rate, regular rhythm and normal heart sounds.   No murmur heard. Respiratory: Effort normal and breath sounds normal.  GI: Soft. He exhibits no distension and no mass. There is no tenderness.  Musculoskeletal: He exhibits no edema.  Lymphadenopathy:    He has no cervical adenopathy.  Neurological: He is alert.  Skin: Skin is warm and dry.     Assessment/Plan Chronic GERD. Chronic NSAID use Anemia felt to be due to iron deficiency. Diagnostic EGD and colonoscopy.   Hildred Laser, MD 01/02/2017, 9:38 AM

## 2017-01-02 NOTE — Op Note (Addendum)
[facility] Patient Name: Philip Gates Procedure Date: 01/02/2017 9:08 AM MRN: [id number] Date of Birth: 12/08/1955 Attending MD: Hildred Laser , MD CSN: [id number] Age: 61 Admit Type: Outpatient Procedure:                Upper GI endoscopy Indications:              Unexplained iron deficiency anemia,                            Gastro-esophageal reflux disease Providers:                Hildred Laser, MD, Charlyne Petrin RN, RN, Aram Candela Referring MD:             Delphina Cahill, MD Medicines:                Lidocaine spray, Promethazine 6.25 mg IV,                            Meperidine 50 mg IV, Midazolam 5 mg IV Complications:            No immediate complications. Estimated Blood Loss:     Estimated blood loss: none. Procedure:                Pre-Anesthesia Assessment:                           - Prior to the procedure, a History and Physical                            was performed, and patient medications and                            allergies were reviewed. The patient's tolerance of                            previous anesthesia was also reviewed. The risks                            and benefits of the procedure and the sedation                            options and risks were discussed with the patient.                            All questions were answered, and informed consent                            was obtained. Prior Anticoagulants: The patient                            last took aspirin 2 days and ibuprofen 1 day prior  to the procedure. ASA Grade Assessment: III - A                            patient with severe systemic disease. After                            reviewing the risks and benefits, the patient was                            deemed in satisfactory condition to undergo the                            procedure.                           After obtaining informed consent, the endoscope was                       passed under direct vision. Throughout the                            procedure, the patient's blood pressure, pulse, and                            oxygen saturations were monitored continuously. The                            EG-[id number] ([id number]) scope was introduced through the                            mouth, and advanced to the second part of duodenum.                            The upper GI endoscopy was accomplished without                            difficulty. The patient tolerated the procedure                            well. Scope In: 9:49:30 AM Scope Out: 9:54:56 AM Total Procedure Duration: 0 hours 5 minutes 26 seconds  Findings:      The examined esophagus was normal.      The Z-line was irregular and was found 36 cm from the incisors.      A 2 cm hiatal hernia was present.      The entire examined stomach was normal.      The duodenal bulb and second portion of the duodenum were normal. Impression:               - Normal esophagus.                           - Z-line irregular, 36 cm from the incisors.                           - 2 cm hiatal hernia.                           -  Normal stomach.                           - Normal duodenal bulb and second portion of the                            duodenum.                           - No specimens collected. Moderate Sedation:      Moderate (conscious) sedation was administered by the endoscopy nurse       and supervised by the endoscopist. The following parameters were       monitored: oxygen saturation, heart rate, blood pressure, CO2       capnography and response to care. Total physician intraservice time was       8 minutes. Recommendation:           - Patient has a contact number available for                            emergencies. The signs and symptoms of potential                            delayed complications were discussed with the                            patient. Return to normal  activities tomorrow.                            Written discharge instructions were provided to the                            patient.                           - Resume previous diet today.                           - Continue present medications. Procedure Code(s):        --- Professional ---                           [phone], Esophagogastroduodenoscopy, flexible,                            transoral; diagnostic, including collection of                            specimen(s) by brushing or washing, when performed                            (separate procedure) Diagnosis Code(s):        --- Professional ---                           K22.8, Other specified diseases of esophagus  K44.9, Diaphragmatic hernia without obstruction or                            gangrene                           D50.9, Iron deficiency anemia, unspecified                           K21.9, Gastro-esophageal reflux disease without                            esophagitis CPT copyright 2016 American Medical Association. All rights reserved. The codes documented in this report are preliminary and upon coder review may  be revised to meet current compliance requirements. Hildred Laser, MD Hildred Laser, MD 01/02/2017 10:24:34 AM This report has been signed electronically. Number of Addenda: 0

## 2017-01-02 NOTE — Discharge Instructions (Signed)
Keep ibuprofen use to minimum. Resume other medications and diet as before. [place] with iron chewable 1 tablet twice daily. No driving for 24 hours. Physician will call with results of blood tests.   Colonoscopy, Adult, Care After This sheet gives you information about how to care for yourself after your procedure. Your doctor may also give you more specific instructions. If you have problems or questions, call your doctor. Follow these instructions at home: General instructions    For the first 24 hours after the procedure:  Do not drive or use machinery.  Do not sign important documents.  Do not drink alcohol.  Do your daily activities more slowly than normal.  Eat foods that are soft and easy to digest.  Rest often.  Take over-the-counter or prescription medicines only as told by your doctor.  It is up to you to get the results of your procedure. Ask your doctor, or the department performing the procedure, when your results will be ready. To help cramping and bloating:   Try walking around.  Put heat on your belly (abdomen) as told by your doctor. Use a heat source that your doctor recommends, such as a moist heat pack or a heating pad.  Put a towel between your skin and the heat source.  Leave the heat on for 20-30 minutes.  Remove the heat if your skin turns bright red. This is especially important if you cannot feel pain, heat, or cold. You can get burned. Eating and drinking   Drink enough fluid to keep your pee (urine) clear or pale yellow.  Return to your normal diet as told by your doctor. Avoid heavy or fried foods that are hard to digest.  Avoid drinking alcohol for as long as told by your doctor. Contact a doctor if:  You have blood in your poop (stool) 2-3 days after the procedure. Get help right away if:  You have more than a small amount of blood in your poop.  You see large clumps of tissue (blood clots) in your poop.  Your belly is  swollen.  You feel sick to your stomach (nauseous).  You throw up (vomit).  You have a fever.  You have belly pain that gets worse, and medicine does not help your pain. This information is not intended to replace advice given to you by your health care provider. Make sure you discuss any questions you have with your health care provider. Document Released: 09/27/2010 Document Revised: 05/19/2016 Document Reviewed: 05/19/2016 Elsevier Interactive Patient Education  2017 [employer]  Hemorrhoids Hemorrhoids are swollen veins in and around the rectum or anus. There are two types of hemorrhoids:  Internal hemorrhoids. These occur in the veins that are just inside the rectum. They may poke through to the outside and become irritated and painful.  External hemorrhoids. These occur in the veins that are outside of the anus and can be felt as a painful swelling or hard lump near the anus. Most hemorrhoids do not cause serious problems, and they can be managed with home treatments such as diet and lifestyle changes. If home treatments do not help your symptoms, procedures can be done to shrink or remove the hemorrhoids. What are the causes? This condition is caused by increased pressure in the anal area. This pressure may result from various things, including:  Constipation.  Straining to have a bowel movement.  Diarrhea.  Pregnancy.  Obesity.  Sitting for long periods of time.  Heavy lifting or other activity that  causes you to strain.  Anal sex. What are the signs or symptoms? Symptoms of this condition include:  Pain.  Anal itching or irritation.  Rectal bleeding.  Leakage of stool (feces).  Anal swelling.  One or more lumps around the anus. How is this diagnosed? This condition can often be diagnosed through a visual exam. Other exams or tests may also be done, such as:  Examination of the rectal area with a gloved hand (digital rectal exam).  Examination of  the anal canal using a small tube (anoscope).  A blood test, if you have lost a significant amount of blood.  A test to look inside the colon (sigmoidoscopy or colonoscopy). How is this treated? This condition can usually be treated at home. However, various procedures may be done if dietary changes, lifestyle changes, and other home treatments do not help your symptoms. These procedures can help make the hemorrhoids smaller or remove them completely. Some of these procedures involve surgery, and others do not. Common procedures include:  Rubber band ligation. Rubber bands are placed at the base of the hemorrhoids to cut off the blood supply to them.  Sclerotherapy. Medicine is injected into the hemorrhoids to shrink them.  Infrared coagulation. A type of light energy is used to get rid of the hemorrhoids.  Hemorrhoidectomy surgery. The hemorrhoids are surgically removed, and the veins that supply them are tied off.  Stapled hemorrhoidopexy surgery. A circular stapling device is used to remove the hemorrhoids and use staples to cut off the blood supply to them. Follow these instructions at home: Eating and drinking   Eat foods that have a lot of fiber in them, such as whole grains, beans, nuts, fruits, and vegetables. Ask your health care provider about taking products that have added fiber (fiber supplements).  Drink enough fluid to keep your urine clear or pale yellow. Managing pain and swelling   Take warm sitz baths for 20 minutes, 3-4 times a day to ease pain and discomfort.  If directed, apply ice to the affected area. Using ice packs between sitz baths may be helpful.  Put ice in a plastic bag.  Place a towel between your skin and the bag.  Leave the ice on for 20 minutes, 2-3 times a day. General instructions   Take over-the-counter and prescription medicines only as told by your health care provider.  Use medicated creams or suppositories as told.  Exercise  regularly.  Go to the bathroom when you have the urge to have a bowel movement. Do not wait.  Avoid straining to have bowel movements.  Keep the anal area dry and clean. Use wet toilet paper or moist towelettes after a bowel movement.  Do not sit on the toilet for long periods of time. This increases blood pooling and pain. Contact a health care provider if:  You have increasing pain and swelling that are not controlled by treatment or medicine.  You have uncontrolled bleeding.  You have difficulty having a bowel movement, or you are unable to have a bowel movement.  You have pain or inflammation outside the area of the hemorrhoids. This information is not intended to replace advice given to you by your health care provider. Make sure you discuss any questions you have with your health care provider. Document Released: 08/22/2000 Document Revised: 01/23/2016 Document Reviewed: 05/09/2015 Elsevier Interactive Patient Education  2017 [employer].

## 2017-01-02 NOTE — Op Note (Signed)
[facility] Patient Name: Philip Gates Procedure Date: 01/02/2017 9:56 AM MRN: [id number] Date of Birth: 10-Dec-1955 Attending MD: Hildred Laser , MD CSN: [id number] Age: 61 Admit Type: Outpatient Procedure:                Colonoscopy Indications:              Unexplained iron deficiency anemia Providers:                Hildred Laser, MD, Charlyne Petrin RN, RN, Aram Candela Referring MD:             Delphina Cahill, MD Medicines:                Midazolam 0 mg IV Complications:            No immediate complications. Estimated Blood Loss:     Estimated blood loss: none. Procedure:                Pre-Anesthesia Assessment:                           - Prior to the procedure, a History and Physical                            was performed, and patient medications and                            allergies were reviewed. The patient's tolerance of                            previous anesthesia was also reviewed. The risks                            and benefits of the procedure and the sedation                            options and risks were discussed with the patient.                            All questions were answered, and informed consent                            was obtained. Prior Anticoagulants: The patient                            last took aspirin 2 days and ibuprofen 1 day prior                            to the procedure. ASA Grade Assessment: III - A                            patient with severe systemic disease. After  reviewing the risks and benefits, the patient was                            deemed in satisfactory condition to undergo the                            procedure.                           After obtaining informed consent, the colonoscope                            was passed under direct vision. Throughout the                            procedure, the patient's blood pressure, pulse, and                oxygen saturations were monitored continuously. The                            [id number] ([id number]) scope was introduced through                            the anus and advanced to the the cecum, identified                            by appendiceal orifice and ileocecal valve. The                            colonoscopy was performed without difficulty. The                            patient tolerated the procedure well. The quality                            of the bowel preparation was good. The ileocecal                            valve, appendiceal orifice, and rectum were                            photographed. Scope In: 9:59:49 AM Scope Out: 10:13:41 AM Scope Withdrawal Time: 0 hours 8 minutes 22 seconds  Total Procedure Duration: 0 hours 13 minutes 52 seconds  Findings:      The perianal and digital rectal examinations were normal.      The colon (entire examined portion) appeared normal.      External hemorrhoids were found during retroflexion. The hemorrhoids       were small. Impression:               - The entire examined colon is normal.                           - External hemorrhoids.                           -  No specimens collected. Moderate Sedation:      Moderate (conscious) sedation was administered by the endoscopy nurse       and supervised by the endoscopist. The following parameters were       monitored: oxygen saturation, heart rate, blood pressure, CO2       capnography and response to care. Total physician intraservice time was       14 minutes. Recommendation:           - Patient has a contact number available for                            emergencies. The signs and symptoms of potential                            delayed complications were discussed with the                            patient. Return to normal activities tomorrow.                            Written discharge instructions were provided to the                             patient.                           - Resume previous diet today.                           - Continue present medications.                           - will check H&H serum iron and TIBC today.                           - [place]stone chewable with iron 1 tablet twice a day                           - Repeat colonoscopy in 10 years for screening                            purposes. Procedure Code(s):        --- Professional ---                           [phone], Colonoscopy, flexible; diagnostic, including                            collection of specimen(s) by brushing or washing,                            when performed (separate procedure)                           99152, Moderate sedation services provided by the  same physician or other qualified health care                            professional performing the diagnostic or                            therapeutic service that the sedation supports,                            requiring the presence of an independent trained                            observer to assist in the monitoring of the                            patient's level of consciousness and physiological                            status; initial 15 minutes of intraservice time,                            patient age 16 years or older Diagnosis Code(s):        --- Professional ---                           K64.4, Residual hemorrhoidal skin tags                           D50.9, Iron deficiency anemia, unspecified CPT copyright 2016 American Medical Association. All rights reserved. The codes documented in this report are preliminary and upon coder review may  be revised to meet current compliance requirements. Hildred Laser, MD Hildred Laser, MD 01/02/2017 10:28:14 AM This report has been signed electronically. Number of Addenda: 0

## 2017-01-05 ENCOUNTER — Telehealth (INDEPENDENT_AMBULATORY_CARE_PROVIDER_SITE_OTHER): Payer: Self-pay | Admitting: Internal Medicine

## 2017-01-05 NOTE — Telephone Encounter (Signed)
Philip Gates, the patient  Had an EGD.  He could try some Imodium.

## 2017-01-05 NOTE — Telephone Encounter (Signed)
Patient was called. He states that his diarrhea got better. He now ask if he can take the vitamin that Dr.Hall gave him and the [facility] Vitamin with Iron together? To be addressed by Dr.Rehman 01/06/2017.

## 2017-01-05 NOTE — Telephone Encounter (Signed)
Forwarded to Danise Edge will you take a look at this as Dr.Rehman is out of town today?

## 2017-01-05 NOTE — Telephone Encounter (Signed)
Patient left a message Friday afternoon and stated that he had a procedure done and the diarrhea had not stopped.  He'd like to know when it will stop.  [phone]

## 2017-01-09 NOTE — Telephone Encounter (Signed)
Per Dr.Rehman patient may take both. Patient was called and made aware.

## 2017-01-12 ENCOUNTER — Other Ambulatory Visit: Payer: Self-pay | Admitting: Neurosurgery

## 2017-01-12 DIAGNOSIS — I671 Cerebral aneurysm, nonruptured: Secondary | ICD-10-CM

## 2017-01-14 ENCOUNTER — Encounter (HOSPITAL_COMMUNITY): Payer: Self-pay | Admitting: Internal Medicine

## 2017-01-19 ENCOUNTER — Other Ambulatory Visit: Payer: Self-pay | Admitting: Student

## 2017-01-20 ENCOUNTER — Other Ambulatory Visit: Payer: Self-pay | Admitting: Neurosurgery

## 2017-01-20 ENCOUNTER — Ambulatory Visit (HOSPITAL_COMMUNITY)
Admission: RE | Admit: 2017-01-20 | Discharge: 2017-01-20 | Disposition: A | Discharge status: Home / Self Care | Payer: Commercial Managed Care - HMO | Source: Ambulatory Visit | Attending: Neurosurgery | Admitting: Neurosurgery

## 2017-01-20 DIAGNOSIS — Z8774 Personal history of (corrected) congenital malformations of heart and circulatory system: Secondary | ICD-10-CM | POA: Diagnosis not present

## 2017-01-20 DIAGNOSIS — J45909 Unspecified asthma, uncomplicated: Secondary | ICD-10-CM | POA: Insufficient documentation

## 2017-01-20 DIAGNOSIS — I872 Venous insufficiency (chronic) (peripheral): Secondary | ICD-10-CM | POA: Diagnosis not present

## 2017-01-20 DIAGNOSIS — I671 Cerebral aneurysm, nonruptured: Secondary | ICD-10-CM

## 2017-01-20 DIAGNOSIS — N400 Enlarged prostate without lower urinary tract symptoms: Secondary | ICD-10-CM | POA: Insufficient documentation

## 2017-01-20 DIAGNOSIS — Z880 Allergy status to penicillin: Secondary | ICD-10-CM | POA: Diagnosis not present

## 2017-01-20 DIAGNOSIS — F319 Bipolar disorder, unspecified: Secondary | ICD-10-CM | POA: Insufficient documentation

## 2017-01-20 DIAGNOSIS — M1990 Unspecified osteoarthritis, unspecified site: Secondary | ICD-10-CM | POA: Diagnosis not present

## 2017-01-20 DIAGNOSIS — F17210 Nicotine dependence, cigarettes, uncomplicated: Secondary | ICD-10-CM | POA: Diagnosis not present

## 2017-01-20 DIAGNOSIS — Z7982 Long term (current) use of aspirin: Secondary | ICD-10-CM | POA: Diagnosis not present

## 2017-01-20 DIAGNOSIS — K219 Gastro-esophageal reflux disease without esophagitis: Secondary | ICD-10-CM | POA: Diagnosis not present

## 2017-01-20 DIAGNOSIS — G8929 Other chronic pain: Secondary | ICD-10-CM | POA: Diagnosis not present

## 2017-01-20 DIAGNOSIS — Z48812 Encounter for surgical aftercare following surgery on the circulatory system: Secondary | ICD-10-CM | POA: Diagnosis not present

## 2017-01-20 DIAGNOSIS — M542 Cervicalgia: Secondary | ICD-10-CM | POA: Diagnosis not present

## 2017-01-20 HISTORY — PX: IR ANGIO INTRA EXTRACRAN SEL INTERNAL CAROTID BILAT MOD SED: IMG5363

## 2017-01-20 HISTORY — PX: IR ANGIO VERTEBRAL SEL VERTEBRAL UNI L MOD SED: IMG5367

## 2017-01-20 LAB — BASIC METABOLIC PANEL
Anion gap: 7 (ref 5–15)
BUN: 12 mg/dL (ref 6–20)
CALCIUM: 9 mg/dL (ref 8.9–10.3)
CO2: 27 mmol/L (ref 22–32)
Chloride: 105 mmol/L (ref 101–111)
Creatinine, Ser: 0.86 mg/dL (ref 0.61–1.24)
GFR calc Af Amer: >60 mL/min (ref >60)
Glucose, Bld: 97 mg/dL (ref 65–99)
POTASSIUM: 3.4 mmol/L — AB (ref 3.5–5.1)
SODIUM: 139 mmol/L (ref 135–145)

## 2017-01-20 LAB — CBC WITH DIFFERENTIAL/PLATELET
BASOS ABS: 0 10*3/uL (ref 0.0–0.1)
BASOS PCT: 0 %
EOS ABS: 0.1 10*3/uL (ref 0.0–0.7)
EOS PCT: 1 %
HCT: 42.8 % (ref 39.0–52.0)
Hemoglobin: 14.6 g/dL (ref 13.0–17.0)
LYMPHS PCT: 33 %
Lymphs Abs: 2.2 10*3/uL (ref 0.7–4.0)
MCH: 29.6 pg (ref 26.0–34.0)
MCHC: 34.1 g/dL (ref 30.0–36.0)
MCV: 86.8 fL (ref 78.0–100.0)
Monocytes Absolute: 0.6 10*3/uL (ref 0.1–1.0)
Monocytes Relative: 9 %
Neutro Abs: 3.7 10*3/uL (ref 1.7–7.7)
Neutrophils Relative %: 57 %
PLATELETS: 206 10*3/uL (ref 150–400)
RBC: 4.93 MIL/uL (ref 4.22–5.81)
RDW: 17.1 % — AB (ref 11.5–15.5)
WBC: 6.6 10*3/uL (ref 4.0–10.5)

## 2017-01-20 LAB — PROTIME-INR
INR: 0.97
PROTHROMBIN TIME: 12.8 s (ref 11.4–15.2)

## 2017-01-20 LAB — APTT: APTT: 30 s (ref 24–36)

## 2017-01-20 IMAGING — XA IR CAROTID INTERNAL HEAD/NECK BILAT  (MS)
7 series · 13 of 24 positions shown · IV contrast (IODINE)
Comparison: none

PROCEDURE:
DIAGNOSTIC CEREBRAL ANGIOGRAM
HISTORY: The patient is a 60-year-old man with a history of right internal
carotid artery aneurysm treated with the pipeline embolization
device approximately 2 years ago. He presents today for routine
long-term angiographic follow-up.
TECHNIQUE: CATHETERS AND WIRES
5-French JB-1 glidecatheter  0.035" glidewire

[Series 1: carotid care 2 · 2 acquisitions, 2 frames shown (1 of 5)]
[im 1/2]
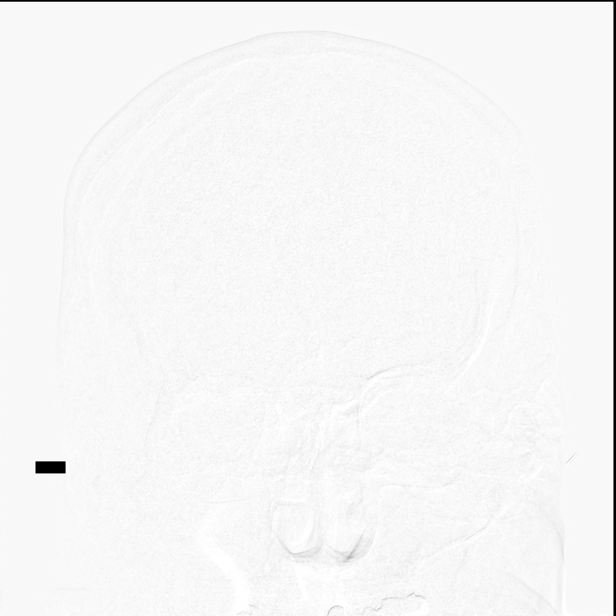
[im 2/2]
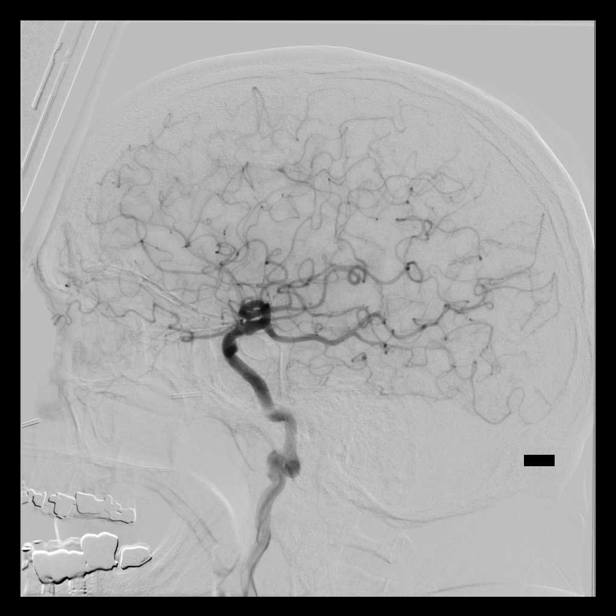

[Series 2: carotid care 2 · 2 acquisitions, 2 frames shown (2 of 5)]
[im 1/2]
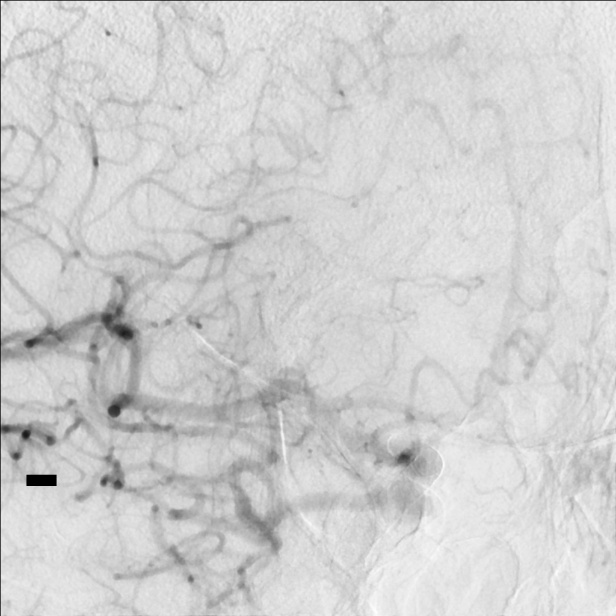
[im 2/2]
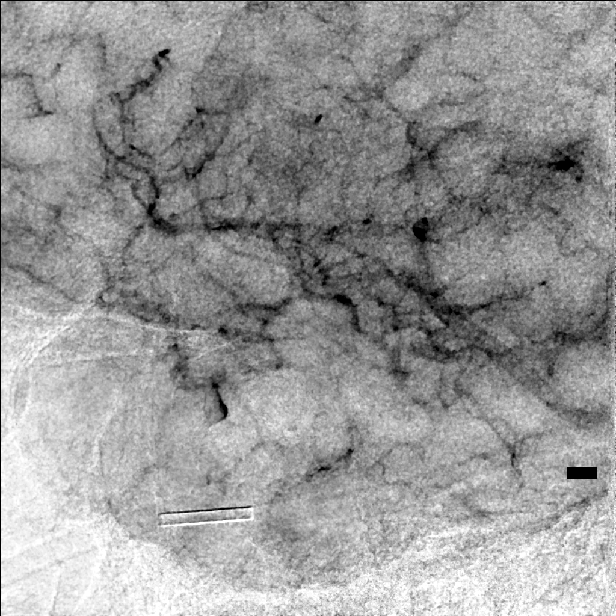

[Series 3: carotid care 2 · 2 acquisitions, 1 frame shown (3 of 5)]
[im 1/2]
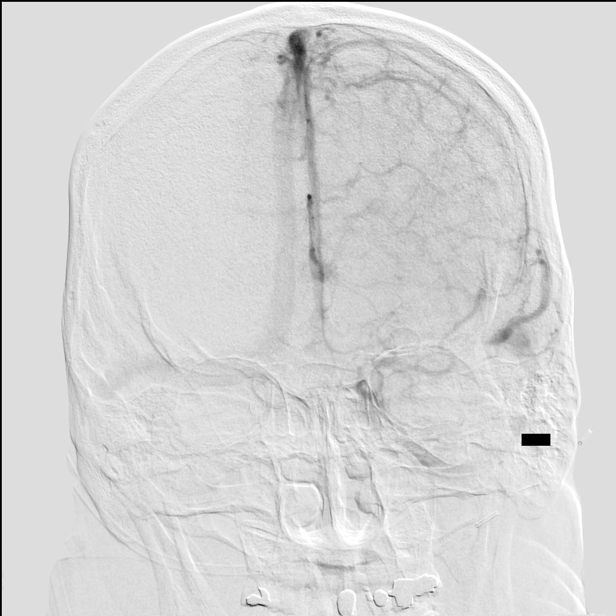

[Series 4: carotid care 2 · 2 acquisitions, 2 frames shown (4 of 5)]
[im 1/2]
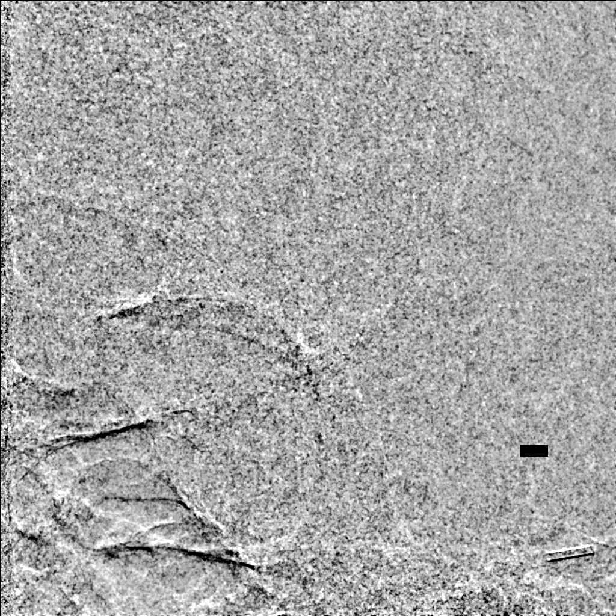
[im 2/2]
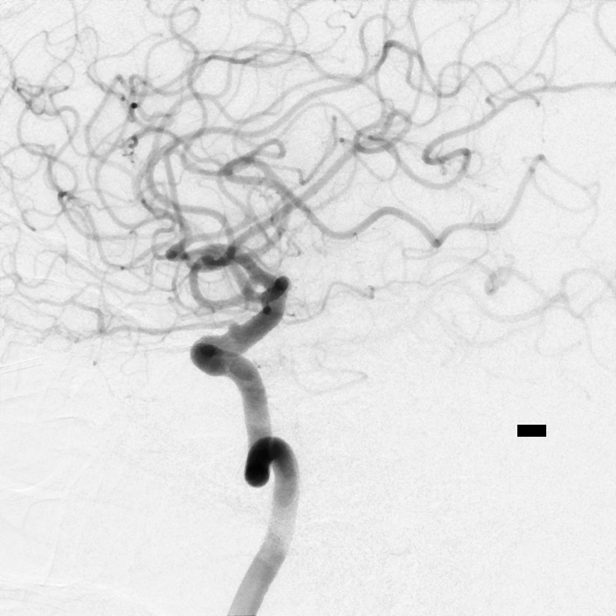

[Series 5: carotid care 2 · 2 acquisitions, 2 frames shown (5 of 5)]
[im 1/2]
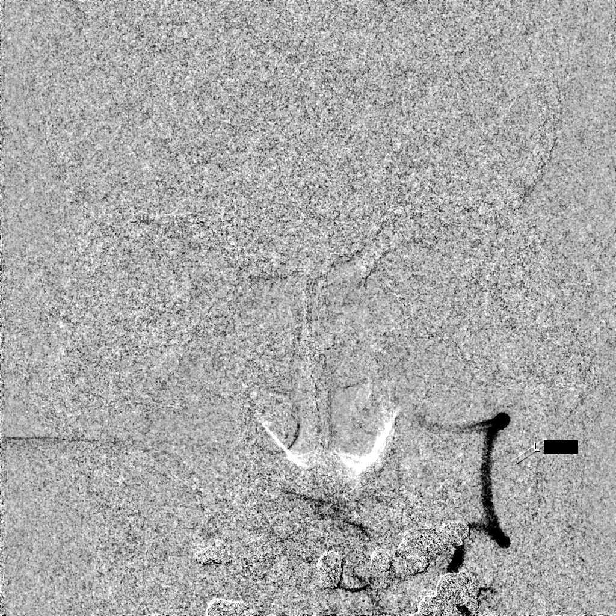
[im 2/2]
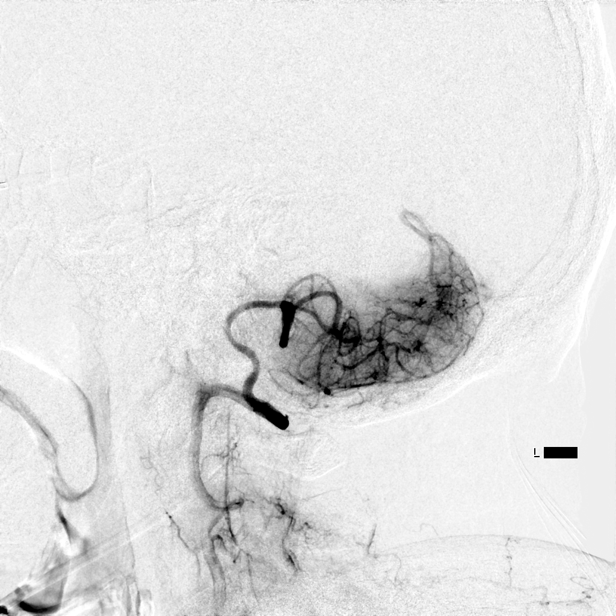

[Series 7: fl neuro n · 1 of 19 frames shown]
[frame 6/19]
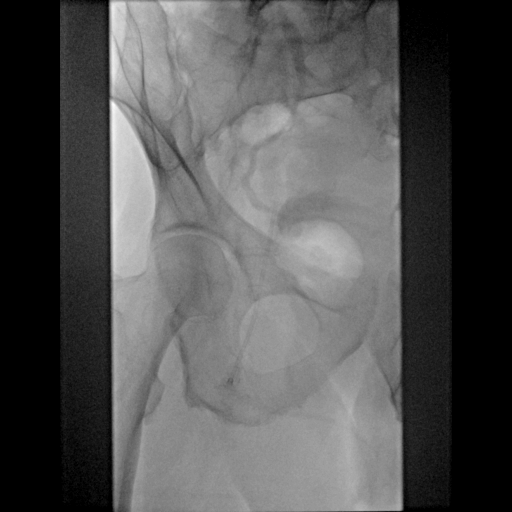

[Series 300: dr. (person_name) · 3 of 12 slices shown]
[im 3/12]
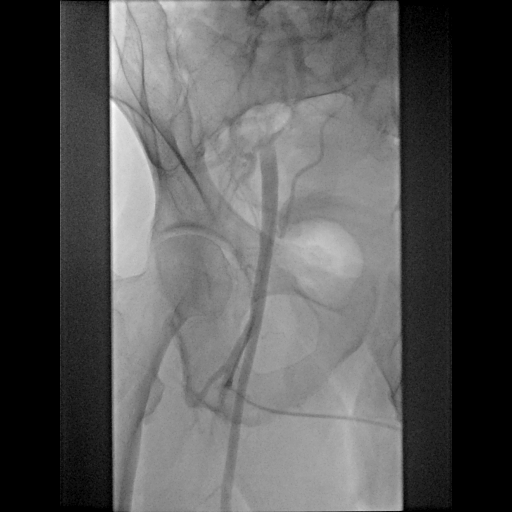
[im 7/12]
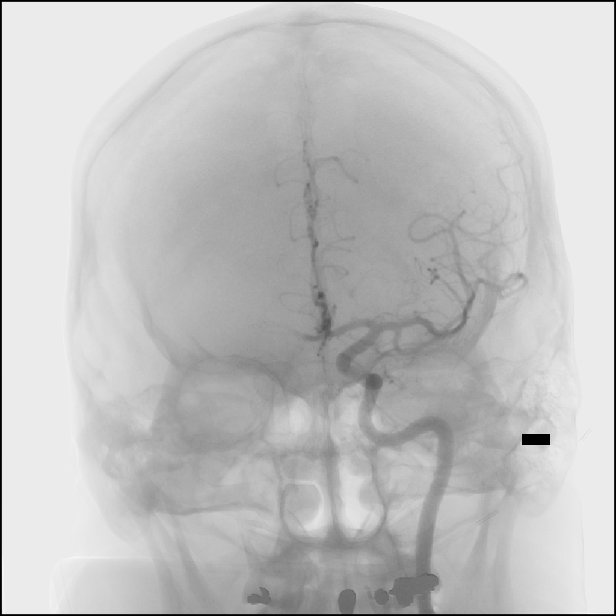
[im 12/12]
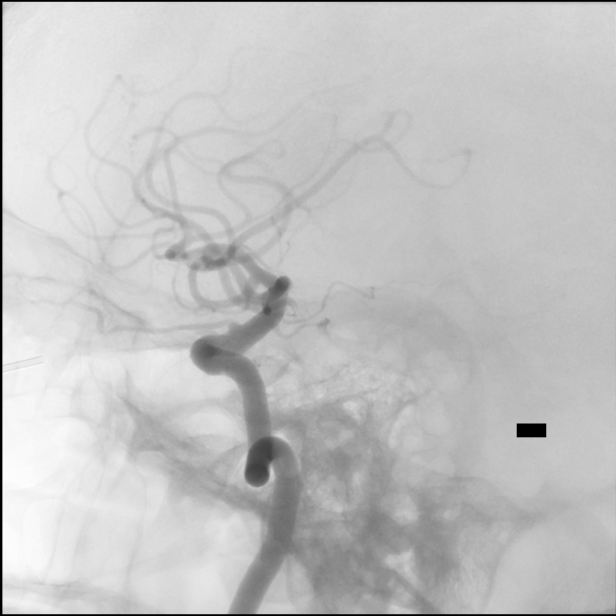

[13 of 24 positions shown; findings below may reference images not displayed]

ACCESS:
The technical aspects of the procedure as well as its potential
risks and benefits were reviewed with the patient. These risks
included but were not limited bleeding, infection, allergic
reaction, damage to organs/vital structures, stroke, non-diagnostic
procedure, and the catastrophic outcomes of heart attack, coma, and
death. With an understanding of these risks, informed consent was
obtained and witnessed. The patient was placed in the supine
position on the angiography table and the skin of right groin
prepped in the usual sterile fashion. The procedure was performed
under local anesthesia (1%-solution of bicarbonate-bufferred
Lidocaine) and conscious sedation with Versed and fentanyl monitored
by the in-suite nurse. A 5- French sheath was introduced in the
right common femoral artery using Seldinger technique. A fluorophase
sequence was used to document the sheath position.

MEDICATIONS:
HEPARIN: [YX] units total.

CONTRAST:  cc, Omnipaque 300

FLUOROSCOPY TIME:  FLUOROSCOPY TIME: 12.6 combined AP and lateral
minutes
VESSELS CATHETERIZED
Right internal carotid

Left internal carotid

Left vertebral

Right common femoral

VESSELS STUDIED
Right internal carotid, head

Left internal carotid, head

Left vertebral

Right femoral

PROCEDURAL NARRATIVE
A 5-Fr JB-1 terumo glide catheter was advanced over a
glidewire into the aortic arch. The above vessels were then
sequentially catheterized and cervical/cerebral angiograms taken.
After review of images, the catheter was removed without incident.
FINDINGS: Right internal carotid: head:

Injection reveals the presence of a widely patent ICA, M1, and A1
segments and their branches. Pipeline device is seen extending from
the cavernous to supraclinoid internal carotid artery. There is no
InStent stenosis. There is continued filling of the previously seen
distal cavernous aneurysm, measuring approximately 5.2 x 3.0 mm. In
comparison to angiogram [DATE], this is largely unchanged.
The parenchymal and venous phases are normal. The venous sinuses are
widely patent.

Left internal carotid: head:

Injection reveals the presence of a widely patent ICA, A1, and M1
segments and their branches. There is no significant stenosis,
occlusion, aneurysm, or high flow vascular malformation visualized.
The parenchymal and venous phases are normal. The venous sinuses are
widely patent.

Left vertebral:

Injection reveals the presence of a widely patent vertebral artery.
This terminate as a left PICA. No aneurysms are seen. The
parenchymal and venous phases are normal. The venous sinuses are
widely patent.

Right femoral:

Normal vessel. No significant atherosclerotic disease. Arterial
sheath in adequate position.

DISPOSITION:
Upon completion of the study, the femoral sheath was removed and
hemostasis obtained using a 5-Fr ExoSeal closure device. Good
proximal and distal lower extremity pulses were documented upon
achievement of hemostasis. The procedure was well tolerated and no
early complications were observed. The patient was transferred back
to the holding area to be positioned flat in bed for 3 hours of
observation.
IMPRESSION: 1. Continued residual filling of the cavernous right internal
carotid artery, now 2 years after treatment with the pipeline
embolization device. Although considerably smaller than the original
aneurysm, it is unchanged in comparison to prior angiogram 18 months
ago. No InStent stenosis is seen.

The preliminary results of this procedure were shared with the
patient and the patient's family.

## 2017-01-20 MED ORDER — HYDROCODONE-ACETAMINOPHEN 5-325 MG PO TABS
1.0000 | ORAL_TABLET | ORAL | Status: DC | PRN
  Administered 2017-01-20: 2 via ORAL

## 2017-01-20 MED ORDER — CHLORHEXIDINE GLUCONATE CLOTH 2 % EX PADS: 6.0000 | MEDICATED_PAD | Freq: Once | CUTANEOUS | Status: DC

## 2017-01-20 MED ORDER — LIDOCAINE HCL 1 % IJ SOLN
INTRAMUSCULAR | Status: AC
  Filled 2017-01-20: qty 20

## 2017-01-20 MED ORDER — MIDAZOLAM HCL 2 MG/2ML IJ SOLN
INTRAMUSCULAR | Status: AC | PRN
  Administered 2017-01-20: 1 mg via INTRAVENOUS

## 2017-01-20 MED ORDER — MIDAZOLAM HCL 2 MG/2ML IJ SOLN
INTRAMUSCULAR | Status: AC
  Filled 2017-01-20: qty 2

## 2017-01-20 MED ORDER — SODIUM CHLORIDE 0.9 % IV SOLN: INTRAVENOUS | Status: DC

## 2017-01-20 MED ORDER — HEPARIN SODIUM (PORCINE) 1000 UNIT/ML IJ SOLN
INTRAMUSCULAR | Status: AC | PRN
  Administered 2017-01-20: 2000 [IU] via INTRAVENOUS

## 2017-01-20 MED ORDER — FENTANYL CITRATE (PF) 100 MCG/2ML IJ SOLN
INTRAMUSCULAR | Status: AC
  Filled 2017-01-20: qty 2

## 2017-01-20 MED ORDER — HYDROCODONE-ACETAMINOPHEN 5-325 MG PO TABS
ORAL_TABLET | ORAL | Status: AC
  Administered 2017-01-20: 2 via ORAL
  Filled 2017-01-20: qty 2

## 2017-01-20 MED ORDER — LIDOCAINE HCL 1 % IJ SOLN
INTRAMUSCULAR | Status: AC | PRN
  Administered 2017-01-20: 10 mL

## 2017-01-20 MED ORDER — HEPARIN SODIUM (PORCINE) 1000 UNIT/ML IJ SOLN
INTRAMUSCULAR | Status: AC
  Filled 2017-01-20: qty 2

## 2017-01-20 MED ORDER — IOPAMIDOL (ISOVUE-300) INJECTION 61%
INTRAVENOUS | Status: AC
  Administered 2017-01-20: 75 mL
  Filled 2017-01-20: qty 100

## 2017-01-20 MED ORDER — FENTANYL CITRATE (PF) 100 MCG/2ML IJ SOLN
INTRAMUSCULAR | Status: AC | PRN
  Administered 2017-01-20: 25 ug via INTRAVENOUS

## 2017-01-20 MED ORDER — SODIUM CHLORIDE 0.9 % IV SOLN
INTRAVENOUS | Status: AC | PRN
  Administered 2017-01-20: 10 mL/h via INTRAVENOUS

## 2017-01-20 NOTE — Discharge Instructions (Addendum)
Femoral Site Care °Refer to this sheet in the next few weeks. These instructions provide you with information about caring for yourself after your procedure. Your health care provider may also give you more specific instructions. Your treatment has been planned according to current medical practices, but problems sometimes occur. Call your health care provider if you have any problems or questions after your procedure. °What can I expect after the procedure? °After your procedure, it is typical to have the following: °· Bruising at the site that usually fades within 1-2 weeks. °· Blood collecting in the tissue (hematoma) that may be painful to the touch. It should usually decrease in size and tenderness within 1-2 weeks. °Follow these instructions at home: °· Take medicines only as directed by your health care provider. °· You may shower 24-48 hours after the procedure or as directed by your health care provider. Remove the bandage (dressing) and gently wash the site with plain soap and water. Pat the area dry with a clean towel. Do not rub the site, because this may cause bleeding. °· Do not take baths, swim, or use a hot tub until your health care provider approves. °· Check your insertion site every day for redness, swelling, or drainage. °· Do not apply powder or lotion to the site. °· Limit use of stairs to twice a day for the first 2-3 days or as directed by your health care provider. °· Do not squat for the first 2-3 days or as directed by your health care provider. °· Do not lift over 10 lb (4.5 kg) for 5 days after your procedure or as directed by your health care provider. °· Ask your health care provider when it is okay to: °¨ Return to work or school. °¨ Resume usual physical activities or sports. °¨ Resume sexual activity. °· Do not drive home if you are discharged the same day as the procedure. Have someone else drive you. °· You may drive 24 hours after the procedure unless otherwise instructed by  your health care provider. °· Do not operate machinery or power tools for 24 hours after the procedure or as directed by your health care provider. °· If your procedure was done as an outpatient procedure, which means that you went home the same day as your procedure, a responsible adult should be with you for the first 24 hours after you arrive home. °· Keep all follow-up visits as directed by your health care provider. This is important. °Contact a health care provider if: °· You have a fever. °· You have chills. °· You have increased bleeding from the site. Hold pressure on the site. °Get help right away if: °· You have unusual pain at the site. °· You have redness, warmth, or swelling at the site. °· You have drainage (other than a small amount of blood on the dressing) from the site. °· The site is bleeding, and the bleeding does not stop after 30 minutes of holding steady pressure on the site. °· Your leg or foot becomes pale, cool, tingly, or numb. °This information is not intended to replace advice given to you by your health care provider. Make sure you discuss any questions you have with your health care provider. °Document Released: 04/28/2014 Document Revised: 01/31/2016 Document Reviewed: 03/14/2014 °Elsevier Interactive Patient Education © 2017 [employer] ° °

## 2017-01-20 NOTE — Sedation Documentation (Signed)
5 Fr. Exoseal to right groin 

## 2017-01-20 NOTE — H&P (Signed)
CC:  Aneurysm  HPI: Philip Gates is a 61 y.o. male who underwent Pipeline embolization of RICA aneurysm about 2 years ago. He has done well and presents for long-term follow-up.  PMH: Past Medical History:  Diagnosis Date  . Acid reflux   . 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 Arthritis   . Asthma    ACTIVITY INDUCED  . Bipolar 1 disorder ([place])   . BPH (benign prostatic hyperplasia)   . Cellulitis 05/2014   right side face  . Cerebral aneurysm    has a stent  . GERD (gastroesophageal reflux disease)   . Neck pain, chronic   . Sinus drainage   . 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  . Venous insufficiency     PSH: Past Surgical History:  Procedure Laterality Date  . cerebral stent    . COLONOSCOPY N/A 01/02/2017   Procedure: COLONOSCOPY;  Surgeon: Rogene Houston, MD;  Location: [facility];  Service: Endoscopy;  Laterality: N/A;  . dental implant  09/26/2014   left lower dental implant  . ESOPHAGOGASTRODUODENOSCOPY N/A 01/02/2017   Procedure: ESOPHAGOGASTRODUODENOSCOPY (EGD);  Surgeon: Rogene Houston, MD;  Location: [facility];  Service: Endoscopy;  Laterality: N/A;  910  . EYE SURGERY Left Jan. 16, 2017   Cataract  . JOINT REPLACEMENT Right    Knee  . JOINT REPLACEMENT Left    Hip  . NOSE SURGERY    . RADIOLOGY WITH ANESTHESIA N/A 12/29/2014   Procedure: Embolization;  Surgeon: Consuella Lose, MD;  Location: [facility];  Service: Radiology;  Laterality: N/A;  . SUBCLAVIAN ANGIOGRAM  Nov. 8, 2016  . TOTAL HIP ARTHROPLASTY Left 01/03/2013   Procedure: TOTAL HIP ARTHROPLASTY ANTERIOR APPROACH;  Surgeon: Gearlean Alf, MD;  Location: [facility];  Service: Orthopedics;  Laterality: Left;  . TOTAL KNEE ARTHROPLASTY Right 10/06/2014   Procedure: RIGHT TOTAL KNEE ARTHROPLASTY;  Surgeon: Gearlean Alf, MD;  Location: [facility];  Service: Orthopedics;  Laterality: Right;    SH: Social History   Substance Use Topics  . Smoking status: Current Every Day Smoker    Packs/day: 0.50    Types: Cigarettes    Start date: 05/23/2015  . Smokeless tobacco: Former Systems developer     Comment: Stated "I smoke off and on"  . Alcohol use No     Comment: 10-12-15 per pt no    MEDS: Prior to Admission medications   Medication Sig Start Date End Date Taking? Authorizing Provider  aspirin 325 MG tablet Take 325 mg by mouth daily.   Yes [provider]  dexlansoprazole (DEXILANT) 60 MG capsule Take 60 mg by mouth every morning.    Yes [provider]  Dutasteride-Tamsulosin HCl (JALYN) 0.5-0.4 MG CAPS Take 1 capsule by mouth daily at 12 noon.    Yes [provider]  ferrous sulfate 325 (65 FE) MG tablet Take 325 mg by mouth daily with breakfast.   Yes [provider]  HYDROcodone-acetaminophen (NORCO) 10-325 MG tablet Take 1 tablet by mouth every 6 (six) hours as needed for moderate pain.   Yes [provider]  ibuprofen (ADVIL,MOTRIN) 200 MG tablet Take 800 mg by mouth every 8 (eight) hours as needed for mild pain or moderate pain.   Yes [provider]  lamoTRIgine (LAMICTAL) 200 MG tablet Take 1 tablet (200 mg total) by mouth at bedtime. 11/12/16  Yes  Cloria Spring, MD  levalbuterol [facility]) 45 MCG/ACT inhaler Inhale 2 puffs into the lungs every 6 (six) hours as needed for wheezing.   Yes [provider]  levocetirizine (XYZAL) 5 MG tablet Take 1 tablet by mouth at bedtime. 11/10/16  Yes [provider]  lisdexamfetamine (VYVANSE) 70 MG capsule Take 1 capsule (70 mg total) by mouth every morning. 11/12/16  Yes Cloria Spring, MD  montelukast (SINGULAIR) 10 MG tablet Take 10 mg by mouth every morning.    Yes [provider]  Multiple Vitamins-Minerals (MULTIVITAMIN WITH MINERALS) tablet Take 1 tablet by mouth daily.   Yes [provider]  Pediatric Multivitamins-Iron (FLINTSTONES PLUS IRON) chewable tablet Chew 1 tablet  by mouth 2 (two) times daily. 01/02/17  Yes Rehman, Mechele Dawley, MD  promethazine (PHENERGAN) 25 MG tablet Take 25 mg by mouth every 6 (six) hours as needed for nausea or vomiting.   Yes [provider]  ranitidine (ZANTAC) 150 MG capsule Take 150 mg by mouth daily as needed for heartburn.   Yes [provider]  tadalafil (CIALIS) 20 MG tablet Take 20 mg by mouth daily as needed for erectile dysfunction.   Yes [provider]  testosterone cypionate (DEPOTESTOTERONE CYPIONATE) 200 MG/ML injection Inject 200 mg into the muscle every 14 (fourteen) days.  12/16/14  Yes [provider]  ipratropium (ATROVENT) 0.03 % nasal spray Place 2 sprays into both nostrils every 12 (twelve) hours as needed for rhinitis. Reported on 10/23/2015    [provider]    ALLERGY: Allergies  Allergen Reactions  . Aspirin Other (See Comments)    Do not take with depakote.   . Erythromycin     Caused a "twisted stomach"  . Penicillins Rash    ROS: ROS  NEUROLOGIC EXAM: Awake, alert, oriented Memory and concentration grossly intact Speech fluent, appropriate CN grossly intact Motor exam: Upper Extremities Deltoid Bicep Tricep Grip  Right 5/5 5/5 5/5 5/5  Left 5/5 5/5 5/5 5/5   Lower Extremity IP Quad PF DF EHL  Right 5/5 5/5 5/5 5/5 5/5  Left 5/5 5/5 5/5 5/5 5/5   Sensation grossly intact to LT  IMPRESSION: - 61 y.o. male 2 yrs s/p Pipeline embolization of RICA aneurysm, doing well.  PLAN: - Will proceed with follow-up angiogram.  I have reviewed the indications, risks, benefits, and alternatives to the angiogram with the patient. All questions were answered.

## 2017-01-30 ENCOUNTER — Encounter (HOSPITAL_COMMUNITY): Payer: Self-pay | Admitting: Neurosurgery

## 2017-02-12 ENCOUNTER — Ambulatory Visit (INDEPENDENT_AMBULATORY_CARE_PROVIDER_SITE_OTHER): Payer: Commercial Managed Care - HMO | Admitting: Psychiatry

## 2017-02-12 ENCOUNTER — Encounter (HOSPITAL_COMMUNITY): Payer: Self-pay | Admitting: Psychiatry

## 2017-02-12 VITALS — BP 150/100 | HR 113 | Ht 71.25 in | Wt 211.0 lb

## 2017-02-12 DIAGNOSIS — F17210 Nicotine dependence, cigarettes, uncomplicated: Secondary | ICD-10-CM

## 2017-02-12 DIAGNOSIS — Z818 Family history of other mental and behavioral disorders: Secondary | ICD-10-CM | POA: Diagnosis not present

## 2017-02-12 DIAGNOSIS — F902 Attention-deficit hyperactivity disorder, combined type: Secondary | ICD-10-CM | POA: Diagnosis not present

## 2017-02-12 DIAGNOSIS — F3162 Bipolar disorder, current episode mixed, moderate: Secondary | ICD-10-CM | POA: Diagnosis not present

## 2017-02-12 MED ORDER — LISDEXAMFETAMINE DIMESYLATE 70 MG PO CAPS: 70.0000 mg | ORAL_CAPSULE | Freq: Every day | ORAL | 0 refills | Status: DC

## 2017-02-12 MED ORDER — LISDEXAMFETAMINE DIMESYLATE 70 MG PO CAPS: 70.0000 mg | ORAL_CAPSULE | Freq: Every morning | ORAL | 0 refills | Status: DC

## 2017-02-12 MED ORDER — LAMOTRIGINE 200 MG PO TABS: 200.0000 mg | ORAL_TABLET | Freq: Every day | ORAL | 2 refills | Status: DC

## 2017-02-12 NOTE — Progress Notes (Signed)
Patient ID: Philip Gates, male   DOB: [birth year], 61 y.o.   MRN: [id number] Patient ID: Philip Gates, male   DOB: 11/09/1955, 61 y.o.   MRN: [id number]  Psychiatric Initial Adult Assessment   Patient Identification: Philip Gates MRN:  [id number] Date of Evaluation:  02/12/2017 Referral Source: Dr. Nevada Crane Chief Complaint:   Chief Complaint    Depression; Manic Behavior; Follow-up     Visit Diagnosis:    ICD-10-CM   1. Bipolar 1 disorder, mixed, moderate (HCC) F31.62   2. Attention deficit hyperactivity disorder (ADHD), combined type F90.2    Diagnosis:   Patient Active Problem List   Diagnosis Date Noted  . Family hx of colon cancer [Z80.0] 11/13/2016  . 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 Bipolar 1 disorder, mixed, moderate ([place]) [F31.62] 10/12/2015  . Attention deficit hyperactivity disorder (ADHD) [F90.9] 10/12/2015  . Chronic venous insufficiency [I87.2] 06/25/2015  . SBO (small bowel obstruction) ([place]) [K56.609] 01/16/2015  . Cerebral aneurysm [I67.1] 12/29/2014  . OA (osteoarthritis) of knee [M17.10] 10/06/2014  . Facial cellulitis [S28.315] 05/31/2014  . Asthma [J45.909]   . GERD (gastroesophageal reflux disease) [K21.9]   . Bipolar 1 disorder ([facility]) [F31.9]   . OA (osteoarthritis) of hip [M16.9] 01/03/2013   History of Present Illness:  This patient is a 61 year old married white male lives with his wife in [place]. 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  The patient was referred by his primary physician, Dr. Wende Neighbors, for further assessment and treatment of bipolar disorder and ADHD.  The patient is a vague historian and he tends to ramble a lot. Apparently as a child he was very unfocused and hyperactive. He is always had difficulty completing tasks. He was never diagnosed with ADHD in childhood and  he was able to complete high school and 1 year of machine shop training. He has worked in numerous jobs in Film/video editor.  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 He quit all this about 10 years ago. About 7 years ago he became increasingly angry and moody and irritable. Some of it started when he and his wife are building a house and he felt like the contractor "screwed me out of $100,000.". He began seeing Dr. Candis Schatz in [place] and was diagnosed as being bipolar because he had significant mood swings anger and irritability. Since that he's been on a combination of Lamictal and Depakote which he thinks has helped. Because of his long-term difficulties with focus and concentration he was started on Vyvanse which she also thinks has helped.  The patient still has some difficulties with irritability but not like he used to. He and his wife don't get along but he admits that he was having an affair with an old girlfriend for the last few years and still talks to this woman periodically. His wife found out about it and is quite resentful. He also has significant memory problems and often loses his train of thought. He denies any head injuries. 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 He has numerous medical problems and has chronic pain from arthritis. Last year he also had knee surgery and is well as a small bowel obstruction.  The patient no longer uses drugs or alcohol. He is active in  church and does a lot of things with his cousin. His energy is pretty good and he states he sleeps fairly well. He has never had psychiatric hospitalization or treatment for drug or alcohol abuse  The patient returns after 3 months. He is off Depakote but he claims he is doing okay with just the Lamictal. His mood is good and he and his wife are getting along well. He denies significant anger spells or irritability. He is focusing well on the  Vyvanse Elements:  Location:  Global. Quality:  Stable. Severity:  Moderate to severe. Timing:  Daily. Duration:  Several years. Context:  Conflicts with wife. Associated Signs/Symptoms: Depression Symptoms:  depressed mood, psychomotor agitation, difficulty concentrating, anxiety, (Hypo) Manic Symptoms:  Distractibility, Irritable Mood, Labiality of Mood,  Past Medical History:  Past Medical History:  Diagnosis Date  . Acid reflux   . 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 Arthritis   . Asthma    ACTIVITY INDUCED  . Bipolar 1 disorder ([place])   . BPH (benign prostatic hyperplasia)   . Cellulitis 05/2014   right side face  . Cerebral aneurysm    has a stent  . GERD (gastroesophageal reflux disease)   . Neck pain, chronic   . Sinus drainage   . 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  . Venous insufficiency     Past Surgical History:  Procedure Laterality Date  . cerebral stent    . COLONOSCOPY N/A 01/02/2017   Procedure: COLONOSCOPY;  Surgeon: Rogene Houston, MD;  [facility];  Service: Endoscopy;  Laterality: N/A;  . dental implant  09/26/2014   left lower dental implant  . ESOPHAGOGASTRODUODENOSCOPY N/A 01/02/2017   Procedure: ESOPHAGOGASTRODUODENOSCOPY (EGD);  Surgeon: Rogene Houston, MD;  [facility];  Service: Endoscopy;  Laterality: N/A;  910  . EYE SURGERY Left Jan. 16, 2017   Cataract  . IR ANGIO INTRA EXTRACRAN SEL INTERNAL CAROTID BILAT MOD SED  01/20/2017  . IR ANGIO VERTEBRAL SEL VERTEBRAL UNI L MOD SED  01/20/2017  . JOINT REPLACEMENT Right    Knee  . JOINT REPLACEMENT Left    Hip  . NOSE SURGERY    . RADIOLOGY WITH ANESTHESIA N/A 12/29/2014   Procedure: Embolization;  Surgeon: Consuella Lose, MD;  Location: [facility];  Service: Radiology;  Laterality: N/A;  . SUBCLAVIAN ANGIOGRAM  Nov. 8, 2016  . TOTAL HIP ARTHROPLASTY Left 01/03/2013   Procedure: TOTAL  HIP ARTHROPLASTY ANTERIOR APPROACH;  Surgeon: Gearlean Alf, MD;  Location: [facility];  Service: Orthopedics;  Laterality: Left;  . TOTAL KNEE ARTHROPLASTY Right 10/06/2014   Procedure: RIGHT TOTAL KNEE ARTHROPLASTY;  Surgeon: Gearlean Alf, MD;  Location: [facility];  Service: Orthopedics;  Laterality: Right;   Family History:  Family History  Problem Relation Age of Onset  . Arrhythmia Mother        Has pacemaker  . Hypertension Mother   . Heart disease Mother   . Mental illness Mother   . Varicose Veins Mother   . Depression Mother   . Colon cancer Father   . Cancer Father        Prostate and Colon  . Diabetes Father   . Hypertension Father   . Cancer - Colon Father   . Depression Sister    Social History:   Social History   Social History  . Marital status:  Married    Spouse name: N/A  . Number of children: N/A  . Years of education: N/A   Social History Main Topics  . Smoking status: Current Every Day Smoker    Packs/day: 0.50    Types: Cigarettes    Start date: 05/23/2015  . Smokeless tobacco: Former Systems developer     Comment: Stated "I smoke off and on"  . Alcohol use No     Comment: 10-12-15 per pt no  . Drug use: No     Comment: 10-12-15 per tp no  . Sexual activity: Yes   Other Topics Concern  . Not on file   Social History Narrative  . No narrative on file   Additional Social History: The patient grew up in [place] with both parents and younger sister. 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 He has been married to the same woman for over 59 years and has 2 grown daughters and 5 grandchildren. He denies any legal history but did use drugs and alcohol for quite a number of years in the past  Musculoskeletal: Strength & Muscle Tone: within normal limits Gait & Station: normal Patient leans: N/A  Psychiatric Specialty Exam: Anxiety  Symptoms include nervous/anxious  behavior.    Depression         Past medical history includes anxiety.     Review of Systems  Eyes: Positive for blurred vision.  Musculoskeletal: Positive for back pain, joint pain and neck pain.  Psychiatric/Behavioral: Positive for depression. The patient is nervous/anxious.     Blood pressure (!) 150/100, pulse (!) 113, height 5' 11.25" (1.81 m), weight 211 lb (95.7 kg), SpO2 92 %.Body mass index is 29.22 kg/m.  General Appearance: Casual and Fairly Groomed  Eye Contact:  Good  Speech:  Clear and Coherent  Volume:  Normal  Mood:   good  Affect:  Bright  Thought Process:  Circumstantial and Coherent  Orientation:  Full (Time, Place, and Person)  Thought Content:  Rumination  Suicidal Thoughts:  No  Homicidal Thoughts:  No  Memory:  Immediate;   Fair Recent;   Poor Remote;   Poor  Judgement:  Fair  Insight:  Lacking  Psychomotor Activity:  Normal  Concentration:  Fair  Recall:  Poor  Fund of Knowledge:Fair  Language: Good  Akathisia:  No  Handed:  Right  AIMS (if indicated):    Assets:  Communication Skills Desire for Improvement Resilience Social Support  ADL's:  Intact  Cognition: WNL  Sleep:  ok   Is the patient at risk to self?  No. Has the patient been a risk to self in the past 6 months?  No. Has the patient been a risk to self within the distant past?  No. Is the patient a risk to others?  No. Has the patient been a risk to others in the past 6 months?  No. Has the patient been a risk to others within the distant past?  No.  Allergies:   Allergies  Allergen Reactions  . Aspirin Other (See Comments)    Do not take with depakote.   . Erythromycin     Caused a "twisted stomach"  . Penicillins Rash   Current Medications: Current Outpatient Prescriptions  Medication Sig Dispense Refill  . aspirin 325 MG tablet Take 325 mg by mouth daily.    Marland Kitchen dexlansoprazole (DEXILANT) 60 MG capsule Take 60  mg by mouth every morning.     . Dutasteride-Tamsulosin  HCl (JALYN) 0.5-0.4 MG CAPS Take 1 capsule by mouth daily at 12 noon.     . ferrous sulfate 325 (65 FE) MG tablet Take 325 mg by mouth daily with breakfast.    . HYDROcodone-acetaminophen (NORCO) 10-325 MG tablet Take 1 tablet by mouth every 6 (six) hours as needed for moderate pain.    Marland Kitchen ibuprofen (ADVIL,MOTRIN) 200 MG tablet Take 800 mg by mouth every 8 (eight) hours as needed for mild pain or moderate pain.    Marland Kitchen ipratropium (ATROVENT) 0.03 % nasal spray Place 2 sprays into both nostrils every 12 (twelve) hours as needed for rhinitis. Reported on 10/23/2015    . lamoTRIgine (LAMICTAL) 200 MG tablet Take 1 tablet (200 mg total) by mouth at bedtime. 30 tablet 2  . levalbuterol (XOPENEX HFA) 45 MCG/ACT inhaler Inhale 2 puffs into the lungs every 6 (six) hours as needed for wheezing.    Marland Kitchen levocetirizine (XYZAL) 5 MG tablet Take 1 tablet by mouth at bedtime.    Marland Kitchen lisdexamfetamine (VYVANSE) 70 MG capsule Take 1 capsule (70 mg total) by mouth every morning. 30 capsule 0  . montelukast (SINGULAIR) 10 MG tablet Take 10 mg by mouth every morning.     . Multiple Vitamins-Minerals (MULTIVITAMIN WITH MINERALS) tablet Take 1 tablet by mouth daily.    . Pediatric Multivitamins-Iron (FLINTSTONES PLUS IRON) chewable tablet Chew 1 tablet by mouth 2 (two) times daily.    . promethazine (PHENERGAN) 25 MG tablet Take 25 mg by mouth every 6 (six) hours as needed for nausea or vomiting.    . ranitidine (ZANTAC) 150 MG capsule Take 150 mg by mouth daily as needed for heartburn.    . tadalafil (CIALIS) 20 MG tablet Take 20 mg by mouth daily as needed for erectile dysfunction.    Marland Kitchen testosterone cypionate (DEPOTESTOTERONE CYPIONATE) 200 MG/ML injection Inject 200 mg into the muscle every 14 (fourteen) days.     Marland Kitchen lisdexamfetamine (VYVANSE) 70 MG capsule Take 1 capsule (70 mg total) by mouth daily. 30 capsule 0  . lisdexamfetamine (VYVANSE) 70 MG capsule Take 1 capsule (70 mg total) by mouth daily. 30 capsule 0   No current  facility-administered medications for this visit.     Previous Psychotropic Medications: Yes   Substance Abuse History in the last 12 months:  No.  Consequences of Substance Abuse: NA  Medical Decision Making:  Established Problem, Stable/Improving (1), Review or order clinical lab tests (1), Review and summation of old records (2), Review or order medicine tests (1) and Review of Medication Regimen & Side Effects (2)  Treatment Plan Summary: Medication management   The patient will continue Vyvanse 70 mg every morning for ADHD, Lamictal 200 mg daily. He will return in 3 months   Nyshawn Gowdy, [facility] 6/7/20182:01 PM

## 2017-03-09 DIAGNOSIS — Z6828 Body mass index (BMI) 28.0-28.9, adult: Secondary | ICD-10-CM | POA: Diagnosis not present

## 2017-03-09 DIAGNOSIS — M542 Cervicalgia: Secondary | ICD-10-CM | POA: Diagnosis not present

## 2017-03-09 DIAGNOSIS — N400 Enlarged prostate without lower urinary tract symptoms: Secondary | ICD-10-CM | POA: Diagnosis not present

## 2017-03-20 DIAGNOSIS — J069 Acute upper respiratory infection, unspecified: Secondary | ICD-10-CM | POA: Diagnosis not present

## 2017-03-20 DIAGNOSIS — R05 Cough: Secondary | ICD-10-CM | POA: Diagnosis not present

## 2017-03-27 ENCOUNTER — Telehealth (HOSPITAL_COMMUNITY): Payer: Self-pay | Admitting: *Deleted

## 2017-03-27 NOTE — Telephone Encounter (Signed)
Prior authorization for Vyvanse received. Called [employer] [phone] was told to submit online

## 2017-03-31 DIAGNOSIS — J060 Acute laryngopharyngitis: Secondary | ICD-10-CM | POA: Diagnosis not present

## 2017-03-31 DIAGNOSIS — Z6828 Body mass index (BMI) 28.0-28.9, adult: Secondary | ICD-10-CM | POA: Diagnosis not present

## 2017-03-31 DIAGNOSIS — R05 Cough: Secondary | ICD-10-CM | POA: Diagnosis not present

## 2017-04-06 ENCOUNTER — Telehealth (HOSPITAL_COMMUNITY): Payer: Self-pay | Admitting: *Deleted

## 2017-04-06 NOTE — Telephone Encounter (Signed)
Received denial of Vyvnase. Called [phone] gave additional information that was required and asked for an urgent request. Decision will be faxed.

## 2017-04-07 ENCOUNTER — Telehealth (HOSPITAL_COMMUNITY): Payer: Self-pay | Admitting: *Deleted

## 2017-04-07 NOTE — Telephone Encounter (Signed)
noted 

## 2017-04-07 NOTE — Telephone Encounter (Signed)
Received fax from [employer] stating that Vyvnase 70mg  has been approved from 04/07/17-09/07/17. Called to notify pharmacy.

## 2017-04-10 DIAGNOSIS — R7301 Impaired fasting glucose: Secondary | ICD-10-CM | POA: Diagnosis not present

## 2017-04-10 DIAGNOSIS — I10 Essential (primary) hypertension: Secondary | ICD-10-CM | POA: Diagnosis not present

## 2017-04-13 DIAGNOSIS — E291 Testicular hypofunction: Secondary | ICD-10-CM | POA: Diagnosis not present

## 2017-04-13 DIAGNOSIS — Z0001 Encounter for general adult medical examination with abnormal findings: Secondary | ICD-10-CM | POA: Diagnosis not present

## 2017-04-13 DIAGNOSIS — R05 Cough: Secondary | ICD-10-CM | POA: Diagnosis not present

## 2017-04-13 DIAGNOSIS — N401 Enlarged prostate with lower urinary tract symptoms: Secondary | ICD-10-CM | POA: Diagnosis not present

## 2017-04-13 DIAGNOSIS — G894 Chronic pain syndrome: Secondary | ICD-10-CM | POA: Diagnosis not present

## 2017-04-13 DIAGNOSIS — G629 Polyneuropathy, unspecified: Secondary | ICD-10-CM | POA: Diagnosis not present

## 2017-04-13 DIAGNOSIS — K219 Gastro-esophageal reflux disease without esophagitis: Secondary | ICD-10-CM | POA: Diagnosis not present

## 2017-05-12 ENCOUNTER — Ambulatory Visit (HOSPITAL_COMMUNITY): Payer: Commercial Managed Care - HMO | Admitting: Psychiatry

## 2017-05-12 ENCOUNTER — Ambulatory Visit (INDEPENDENT_AMBULATORY_CARE_PROVIDER_SITE_OTHER): Payer: PPO | Admitting: Psychiatry

## 2017-05-12 ENCOUNTER — Encounter (HOSPITAL_COMMUNITY): Payer: Self-pay | Admitting: Psychiatry

## 2017-05-12 VITALS — BP 131/107 | HR 115 | Ht 71.75 in | Wt 211.0 lb

## 2017-05-12 DIAGNOSIS — F3162 Bipolar disorder, current episode mixed, moderate: Secondary | ICD-10-CM | POA: Diagnosis not present

## 2017-05-12 DIAGNOSIS — Z818 Family history of other mental and behavioral disorders: Secondary | ICD-10-CM | POA: Diagnosis not present

## 2017-05-12 DIAGNOSIS — F17210 Nicotine dependence, cigarettes, uncomplicated: Secondary | ICD-10-CM | POA: Diagnosis not present

## 2017-05-12 DIAGNOSIS — F902 Attention-deficit hyperactivity disorder, combined type: Secondary | ICD-10-CM | POA: Diagnosis not present

## 2017-05-12 DIAGNOSIS — Z79899 Other long term (current) drug therapy: Secondary | ICD-10-CM | POA: Diagnosis not present

## 2017-05-12 MED ORDER — LISDEXAMFETAMINE DIMESYLATE 70 MG PO CAPS: 70.0000 mg | ORAL_CAPSULE | Freq: Every morning | ORAL | 0 refills | Status: DC

## 2017-05-12 MED ORDER — LISDEXAMFETAMINE DIMESYLATE 70 MG PO CAPS: 70.0000 mg | ORAL_CAPSULE | Freq: Every day | ORAL | 0 refills | Status: DC

## 2017-05-12 MED ORDER — LAMOTRIGINE 200 MG PO TABS: 200.0000 mg | ORAL_TABLET | Freq: Every day | ORAL | 2 refills | Status: DC

## 2017-05-12 NOTE — Progress Notes (Signed)
Patient ID: TANUJ MULLENS, male   DOB: [birth year], 61 y.o.   MRN: [id number] Patient ID: ARNOLDO HILDRETH, male   DOB: 08-Dec-1955, 61 y.o.   MRN: [id number]  Psychiatric Initial Adult Assessment   Patient Identification: Philip Gates MRN:  [id number] Date of Evaluation:  05/12/2017 Referral Source: Dr. Nevada Crane Chief Complaint:   Chief Complaint    Depression; Anxiety; Manic Behavior; Follow-up     Visit Diagnosis:    ICD-10-CM   1. Bipolar 1 disorder, mixed, moderate (HCC) F31.62   2. Attention deficit hyperactivity disorder (ADHD), combined type F90.2    Diagnosis:   Patient Active Problem List   Diagnosis Date Noted  . Family hx of colon cancer [Z80.0] 11/13/2016  . 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 Varicose veins of right lower extremity with complications [Z60.109] 32/35/5732  . Bipolar 1 disorder, mixed, moderate ([place]) [F31.62] 10/12/2015  . Attention deficit hyperactivity disorder (ADHD) [F90.9] 10/12/2015  . Chronic venous insufficiency [I87.2] 06/25/2015  . SBO (small bowel obstruction) ([place]) [K56.609] 01/16/2015  . Cerebral aneurysm [I67.1] 12/29/2014  . OA (osteoarthritis) of knee [M17.10] 10/06/2014  . Facial cellulitis [K02.542] 05/31/2014  . Asthma [J45.909]   . GERD (gastroesophageal reflux disease) [K21.9]   . Bipolar 1 disorder ([place]) [F31.9]   . OA (osteoarthritis) of hip [M16.9] 01/03/2013   History of Present Illness:  This patient is a 61 year old married white male lives with his wife in [place]. 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  The patient was referred by his primary physician, Dr. Wende Neighbors, for further assessment and treatment of bipolar disorder and ADHD.  The patient is a vague historian and he tends to ramble a lot. Apparently as a child he was very unfocused and hyperactive. He is always had difficulty completing tasks. He was never diagnosed with ADHD in  childhood and he was able to complete high school and 1 year of machine shop training. He has worked in numerous jobs in Film/video editor.  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 He quit all this about 10 years ago. About 7 years ago he became increasingly angry and moody and irritable. Some of it started when he and his wife are building a house and he felt like the contractor "screwed me out of $100,000.". He began seeing Dr. Candis Schatz in [place] and was diagnosed as being bipolar because he had significant mood swings anger and irritability. Since that he's been on a combination of Lamictal and Depakote which he thinks has helped. Because of his long-term difficulties with focus and concentration he was started on Vyvanse which he also thinks has helped.  The patient still has some difficulties with irritability but not like he used to. He and his wife don't get along but he admits that he was having an affair with an old girlfriend for the last few years and still talks to this woman periodically. His wife found out about it and is quite resentful. He also has significant memory problems and often loses his train of thought. He denies any head injuries. 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 He has numerous medical problems and has chronic pain from arthritis. Last year he also had knee surgery and is well as a small bowel obstruction.  The patient no longer uses drugs or alcohol. He is active  in church and does a lot of things with his cousin. His energy is pretty good and he states he sleeps fairly well. He has never had psychiatric hospitalization or treatment for drug or alcohol abuse  The patient returns after 3 months. He and his wife are building a house and is almost completed. 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 He states that the Lamictal continues to help his temper issues  and he is not blowing up at people like he used to. The Vyvanse continues to help him focus. His blood pressure remains high and I told him he needs to inform his primary physician. He claims that "goes up and down." Elements:  Location:  Global. Quality:  Stable. Severity:  Moderate to severe. Timing:  Daily. Duration:  Several years. Context:  Conflicts with wife. Associated Signs/Symptoms: Depression Symptoms:  depressed mood, psychomotor agitation, difficulty concentrating, anxiety, (Hypo) Manic Symptoms:  Distractibility, Irritable Mood, Labiality of Mood,  Past Medical History:  Past Medical History:  Diagnosis Date  . Acid reflux   . 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 Arthritis   . Asthma    ACTIVITY INDUCED  . Bipolar 1 disorder ([place])   . BPH (benign prostatic hyperplasia)   . Cellulitis 05/2014   right side face  . Cerebral aneurysm    has a stent  . GERD (gastroesophageal reflux disease)   . Neck pain, chronic   . Sinus drainage   . 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  . Venous insufficiency     Past Surgical History:  Procedure Laterality Date  . cerebral stent    . COLONOSCOPY N/A 01/02/2017   Procedure: COLONOSCOPY;  Surgeon: Rogene Houston, MD;  [facility];  Service: Endoscopy;  Laterality: N/A;  . dental implant  09/26/2014   left lower dental implant  . ESOPHAGOGASTRODUODENOSCOPY N/A 01/02/2017   Procedure: ESOPHAGOGASTRODUODENOSCOPY (EGD);  Surgeon: Rogene Houston, MD;  [facility];  Service: Endoscopy;  Laterality: N/A;  910  . EYE SURGERY Left Jan. 16, 2017   Cataract  . IR ANGIO INTRA EXTRACRAN SEL INTERNAL CAROTID BILAT MOD SED  01/20/2017  . IR ANGIO VERTEBRAL SEL VERTEBRAL UNI L MOD SED  01/20/2017  . JOINT REPLACEMENT Right    Knee  . JOINT REPLACEMENT Left    Hip  . NOSE SURGERY    . RADIOLOGY WITH ANESTHESIA N/A 12/29/2014    Procedure: Embolization;  Surgeon: Consuella Lose, MD;  Location: [facility];  Service: Radiology;  Laterality: N/A;  . SUBCLAVIAN ANGIOGRAM  Nov. 8, 2016  . TOTAL HIP ARTHROPLASTY Left 01/03/2013   Procedure: TOTAL HIP ARTHROPLASTY ANTERIOR APPROACH;  Surgeon: Gearlean Alf, MD;  [facility];  Service: Orthopedics;  Laterality: Left;  . TOTAL KNEE ARTHROPLASTY Right 10/06/2014   Procedure: RIGHT TOTAL KNEE ARTHROPLASTY;  Surgeon: Gearlean Alf, MD;  [facility];  Service: Orthopedics;  Laterality: Right;   Family History:  Family History  Problem Relation Age of Onset  . Arrhythmia Mother        Has pacemaker  . Hypertension Mother   . Heart disease Mother   . Mental illness Mother   . Varicose Veins Mother   . Depression Mother   . Colon cancer Father   . Cancer Father        Prostate and Colon  .  Diabetes Father   . Hypertension Father   . Cancer - Colon Father   . Depression Sister    Social History:   Social History   Social History  . Marital status: Married    Spouse name: N/A  . Number of children: N/A  . Years of education: N/A   Social History Main Topics  . Smoking status: Current Every Day Smoker    Packs/day: 0.50    Types: Cigarettes    Start date: 05/23/2015  . Smokeless tobacco: Former Systems developer     Comment: Stated "I smoke off and on"  . Alcohol use No     Comment: 10-12-15 per pt no  . Drug use: No     Comment: 10-12-15 per tp no  . Sexual activity: Yes   Other Topics Concern  . None   Social History Narrative  . None   Additional Social History: The patient grew up in [place] with both parents and younger sister. 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 He has been married to the same woman for over 92 years and has 2 grown daughters and 5 grandchildren. He denies any legal history but did use drugs and alcohol for quite a number of years in the  past  Musculoskeletal: Strength & Muscle Tone: within normal limits Gait & Station: normal Patient leans: N/A  Psychiatric Specialty Exam: Depression         Past medical history includes anxiety.   Anxiety  Symptoms include nervous/anxious behavior.      Review of Systems  Eyes: Positive for blurred vision.  Musculoskeletal: Positive for back pain, joint pain and neck pain.  Psychiatric/Behavioral: Positive for depression. The patient is nervous/anxious.     Blood pressure (!) 131/107, pulse (!) 115, height 5' 11.75" (1.822 m), weight 211 lb (95.7 kg).Body mass index is 28.82 kg/m.  General Appearance: Casual and Fairly Groomed  Eye Contact:  Good  Speech:  Clear and Coherent  Volume:  Normal  Mood:   good  Affect:  Bright  Thought Process:  Circumstantial and Coherent  Orientation:  Full (Time, Place, and Person)  Thought Content:  Rumination  Suicidal Thoughts:  No  Homicidal Thoughts:  No  Memory:  Immediate;   Fair Recent;   Poor Remote;   Poor  Judgement:  Fair  Insight:  Lacking  Psychomotor Activity:  Normal  Concentration:  Fair  Recall:  Poor  Fund of Knowledge:Fair  Language: Good  Akathisia:  No  Handed:  Right  AIMS (if indicated):    Assets:  Communication Skills Desire for Improvement Resilience Social Support  ADL's:  Intact  Cognition: WNL  Sleep:  ok   Is the patient at risk to self?  No. Has the patient been a risk to self in the past 6 months?  No. Has the patient been a risk to self within the distant past?  No. Is the patient a risk to others?  No. Has the patient been a risk to others in the past 6 months?  No. Has the patient been a risk to others within the distant past?  No.  Allergies:   Allergies  Allergen Reactions  . Aspirin Other (See Comments)    Do not take with depakote.   . Erythromycin     Caused a "twisted stomach"  . Penicillins Rash   Current Medications: Current Outpatient Prescriptions  Medication Sig  Dispense Refill  . lisdexamfetamine (VYVANSE) 70 MG capsule Take 1 capsule (70 mg total) by mouth daily. 30 capsule 0  . Pediatric Multivitamins-Iron (FLINTSTONES PLUS IRON) chewable tablet Chew 1 tablet by mouth 2 (two) times daily.    Marland Kitchen aspirin 325 MG tablet Take 325 mg by mouth daily.    Marland Kitchen dexlansoprazole (DEXILANT) 60 MG capsule Take 60 mg by mouth every morning.     . Dutasteride-Tamsulosin HCl (JALYN) 0.5-0.4 MG CAPS Take 1 capsule by mouth daily at 12 noon.     . ferrous sulfate 325 (65 FE) MG tablet Take 325 mg by mouth daily with breakfast.    . HYDROcodone-acetaminophen (NORCO) 10-325 MG tablet Take 1 tablet by mouth every 6 (six) hours as needed for moderate pain.    Marland Kitchen ibuprofen (ADVIL,MOTRIN) 200 MG tablet Take 800 mg by mouth every 8 (eight) hours as needed for mild pain or moderate pain.    Marland Kitchen ipratropium (ATROVENT) 0.03 % nasal spray Place 2 sprays into both nostrils every 12 (twelve) hours as needed for rhinitis. Reported on 10/23/2015    . lamoTRIgine (LAMICTAL) 200 MG tablet Take 1 tablet (200 mg total) by mouth at bedtime. 30 tablet 2  . levalbuterol (XOPENEX HFA) 45 MCG/ACT inhaler Inhale 2 puffs into the lungs every 6 (six) hours as needed for wheezing.    Marland Kitchen levocetirizine (XYZAL) 5 MG tablet Take 1 tablet by mouth at bedtime.    Marland Kitchen lisdexamfetamine (VYVANSE) 70 MG capsule Take 1 capsule (70 mg total) by mouth daily. 30 capsule 0  . lisdexamfetamine (VYVANSE) 70 MG capsule Take 1 capsule (70 mg total) by mouth every morning. 30 capsule 0  . montelukast (SINGULAIR) 10 MG tablet Take 10 mg by mouth every morning.     . Multiple Vitamins-Minerals (MULTIVITAMIN WITH MINERALS) tablet Take 1 tablet by mouth daily.    . promethazine (PHENERGAN) 25 MG tablet Take 25 mg by mouth every 6 (six) hours as needed for nausea or vomiting.    . ranitidine (ZANTAC) 150 MG capsule Take 150 mg by mouth daily as needed for heartburn.    . tadalafil (CIALIS) 20 MG tablet Take 20 mg by mouth daily as  needed for erectile dysfunction.    Marland Kitchen testosterone cypionate (DEPOTESTOTERONE CYPIONATE) 200 MG/ML injection Inject 200 mg into the muscle every 14 (fourteen) days.      No current facility-administered medications for this visit.     Previous Psychotropic Medications: Yes   Substance Abuse History in the last 12 months:  No.  Consequences of Substance Abuse: NA  Medical Decision Making:  Established Problem, Stable/Improving (1), Review or order clinical lab tests (1), Review and summation of old records (2), Review or order medicine tests (1) and Review of Medication Regimen & Side Effects (2)  Treatment Plan Summary: Medication management   The patient will continue Vyvanse 70 mg every morning for ADHD, Lamictal 200 mg daily. He will return in 3 months   [place], [facility] 9/4/201811:42 AM

## 2017-05-13 DIAGNOSIS — G629 Polyneuropathy, unspecified: Secondary | ICD-10-CM | POA: Diagnosis not present

## 2017-05-13 DIAGNOSIS — E291 Testicular hypofunction: Secondary | ICD-10-CM | POA: Diagnosis not present

## 2017-05-13 DIAGNOSIS — I10 Essential (primary) hypertension: Secondary | ICD-10-CM | POA: Diagnosis not present

## 2017-05-13 DIAGNOSIS — K219 Gastro-esophageal reflux disease without esophagitis: Secondary | ICD-10-CM | POA: Diagnosis not present

## 2017-05-13 DIAGNOSIS — N401 Enlarged prostate with lower urinary tract symptoms: Secondary | ICD-10-CM | POA: Diagnosis not present

## 2017-05-13 DIAGNOSIS — M25569 Pain in unspecified knee: Secondary | ICD-10-CM | POA: Diagnosis not present

## 2017-05-13 DIAGNOSIS — G894 Chronic pain syndrome: Secondary | ICD-10-CM | POA: Diagnosis not present

## 2017-05-13 DIAGNOSIS — Z6828 Body mass index (BMI) 28.0-28.9, adult: Secondary | ICD-10-CM | POA: Diagnosis not present

## 2017-05-13 DIAGNOSIS — D649 Anemia, unspecified: Secondary | ICD-10-CM | POA: Diagnosis not present

## 2017-05-13 DIAGNOSIS — I720 Aneurysm of carotid artery: Secondary | ICD-10-CM | POA: Diagnosis not present

## 2017-05-28 DIAGNOSIS — D649 Anemia, unspecified: Secondary | ICD-10-CM | POA: Diagnosis not present

## 2017-05-28 DIAGNOSIS — E291 Testicular hypofunction: Secondary | ICD-10-CM | POA: Diagnosis not present

## 2017-05-28 DIAGNOSIS — N401 Enlarged prostate with lower urinary tract symptoms: Secondary | ICD-10-CM | POA: Diagnosis not present

## 2017-05-28 DIAGNOSIS — Z6828 Body mass index (BMI) 28.0-28.9, adult: Secondary | ICD-10-CM | POA: Diagnosis not present

## 2017-05-28 DIAGNOSIS — Z79899 Other long term (current) drug therapy: Secondary | ICD-10-CM | POA: Diagnosis not present

## 2017-06-01 DIAGNOSIS — L570 Actinic keratosis: Secondary | ICD-10-CM | POA: Diagnosis not present

## 2017-06-01 DIAGNOSIS — C44719 Basal cell carcinoma of skin of left lower limb, including hip: Secondary | ICD-10-CM | POA: Diagnosis not present

## 2017-06-01 DIAGNOSIS — B078 Other viral warts: Secondary | ICD-10-CM | POA: Diagnosis not present

## 2017-06-01 DIAGNOSIS — L821 Other seborrheic keratosis: Secondary | ICD-10-CM | POA: Diagnosis not present

## 2017-06-01 DIAGNOSIS — L578 Other skin changes due to chronic exposure to nonionizing radiation: Secondary | ICD-10-CM | POA: Diagnosis not present

## 2017-06-26 DIAGNOSIS — R0981 Nasal congestion: Secondary | ICD-10-CM | POA: Diagnosis not present

## 2017-06-26 DIAGNOSIS — Z23 Encounter for immunization: Secondary | ICD-10-CM | POA: Diagnosis not present

## 2017-06-26 DIAGNOSIS — R05 Cough: Secondary | ICD-10-CM | POA: Diagnosis not present

## 2017-06-26 DIAGNOSIS — R252 Cramp and spasm: Secondary | ICD-10-CM | POA: Diagnosis not present

## 2017-06-26 DIAGNOSIS — K140 Glossitis: Secondary | ICD-10-CM | POA: Diagnosis not present

## 2017-07-14 DIAGNOSIS — K140 Glossitis: Secondary | ICD-10-CM | POA: Diagnosis not present

## 2017-08-07 DIAGNOSIS — K149 Disease of tongue, unspecified: Secondary | ICD-10-CM | POA: Diagnosis not present

## 2017-08-07 DIAGNOSIS — I10 Essential (primary) hypertension: Secondary | ICD-10-CM | POA: Diagnosis not present

## 2017-08-07 DIAGNOSIS — Z6831 Body mass index (BMI) 31.0-31.9, adult: Secondary | ICD-10-CM | POA: Diagnosis not present

## 2017-08-10 ENCOUNTER — Ambulatory Visit (HOSPITAL_COMMUNITY): Payer: PPO | Admitting: Psychiatry

## 2017-08-10 ENCOUNTER — Encounter (HOSPITAL_COMMUNITY): Payer: Self-pay | Admitting: Psychiatry

## 2017-08-10 VITALS — BP 142/96 | HR 107 | Ht 71.0 in | Wt 214.0 lb

## 2017-08-10 DIAGNOSIS — R454 Irritability and anger: Secondary | ICD-10-CM

## 2017-08-10 DIAGNOSIS — F419 Anxiety disorder, unspecified: Secondary | ICD-10-CM | POA: Diagnosis not present

## 2017-08-10 DIAGNOSIS — F900 Attention-deficit hyperactivity disorder, predominantly inattentive type: Secondary | ICD-10-CM

## 2017-08-10 DIAGNOSIS — R450 Nervousness: Secondary | ICD-10-CM

## 2017-08-10 DIAGNOSIS — Z818 Family history of other mental and behavioral disorders: Secondary | ICD-10-CM | POA: Diagnosis not present

## 2017-08-10 DIAGNOSIS — F17210 Nicotine dependence, cigarettes, uncomplicated: Secondary | ICD-10-CM

## 2017-08-10 DIAGNOSIS — Z736 Limitation of activities due to disability: Secondary | ICD-10-CM

## 2017-08-10 MED ORDER — LISDEXAMFETAMINE DIMESYLATE 70 MG PO CAPS: 70.0000 mg | ORAL_CAPSULE | Freq: Every morning | ORAL | 0 refills | Status: DC

## 2017-08-10 MED ORDER — LAMOTRIGINE 200 MG PO TABS: 200.0000 mg | ORAL_TABLET | Freq: Every day | ORAL | 2 refills | Status: DC

## 2017-08-10 MED ORDER — LISDEXAMFETAMINE DIMESYLATE 70 MG PO CAPS: 70.0000 mg | ORAL_CAPSULE | Freq: Every day | ORAL | 0 refills | Status: DC

## 2017-08-10 NOTE — Progress Notes (Signed)
BH MD/PA/NP OP Progress Note  08/10/2017 11:09 AM Philip Gates  MRN:  [id number]  Chief Complaint:  Chief Complaint    Anxiety; ADHD     HPI: This patient is a 61 year old married white male lives with his wife in [place]. 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  The patient was referred by his primary physician, Dr. Wende Neighbors, for further assessment and treatment of bipolar disorder and ADHD.  The patient is a vague historian and he tends to ramble a lot. Apparently as a child he was very unfocused and hyperactive. He is always had difficulty completing tasks. He was never diagnosed with ADHD in childhood and he was able to complete high school and 1 year of machine shop training. He has worked in numerous jobs in Film/video editor.  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 He quit all this about 10 years ago. About 7 years ago he became increasingly angry and moody and irritable. Some of it started when he and his wife are building a house and he felt like the contractor "screwed me out of $100,000.". He began seeing Dr. Candis Schatz in [place] and was diagnosed as being bipolar because he had significant mood swings anger and irritability. Since that he's been on a combination of Lamictal and Depakote which he thinks has helped. Because of his long-term difficulties with focus and concentration he was started on Vyvanse which he also thinks has helped.  The patient still has some difficulties with irritability but not like he used to. He and his wife don't get along but he admits that he was having an affair with an old girlfriend for the last few years and still talks to this woman periodically. His wife found out about it and is quite resentful. He also has significant memory problems and often loses his train of thought. He denies any head injuries.  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 He has numerous medical problems and has chronic pain from arthritis. Last year he also had knee surgery and is well as a small bowel obstruction.  The patient no longer uses drugs or alcohol. He is active in church and does a lot of things with his cousin. His energy is pretty good and he states he sleeps fairly well. He has never had psychiatric hospitalization or treatment for drug or alcohol abuse  The patient returns after 3 months.  He is struggling with his new house.  He states the contractor left a number of things unfinished and he and his cousin are trying to get them done.  He is frustrated with the whole situation because he has been through this before.  For the most part however he is not been significantly agitated and his mood is fairly good.  The Vyvanse continues to help him with focus.  Blood pressure is somewhat high again today but his primary doctor just started him on medication for this 3 days ago Visit Diagnosis:    ICD-10-CM   1. Attention deficit hyperactivity disorder (ADHD), predominantly inattentive type F90.0 lisdexamfetamine (VYVANSE) 70 MG capsule    Past Psychiatric History: Past outpatient treatment  Past Medical History:  Past Medical History:  Diagnosis Date  . Acid reflux   . 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  Arthritis   . Asthma    ACTIVITY INDUCED  . Bipolar 1 disorder ([place])   . BPH (benign prostatic hyperplasia)   . Cellulitis 05/2014   right side face  . Cerebral aneurysm    has a stent  . GERD (gastroesophageal reflux disease)   . Neck pain, chronic   . Sinus drainage   . 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  . Venous insufficiency     Past Surgical History:  Procedure Laterality Date  . cerebral stent    . COLONOSCOPY N/A 01/02/2017   Procedure: COLONOSCOPY;  Surgeon: Rogene Houston, MD;   [facility];  Service: Endoscopy;  Laterality: N/A;  . dental implant  09/26/2014   left lower dental implant  . ESOPHAGOGASTRODUODENOSCOPY N/A 01/02/2017   Procedure: ESOPHAGOGASTRODUODENOSCOPY (EGD);  Surgeon: Rogene Houston, MD;  [facility];  Service: Endoscopy;  Laterality: N/A;  910  . EYE SURGERY Left Jan. 16, 2017   Cataract  . IR ANGIO INTRA EXTRACRAN SEL INTERNAL CAROTID BILAT MOD SED  01/20/2017  . IR ANGIO VERTEBRAL SEL VERTEBRAL UNI L MOD SED  01/20/2017  . JOINT REPLACEMENT Right    Knee  . JOINT REPLACEMENT Left    Hip  . NOSE SURGERY    . RADIOLOGY WITH ANESTHESIA N/A 12/29/2014   Procedure: Embolization;  Surgeon: Consuella Lose, MD;  Location: [facility];  Service: Radiology;  Laterality: N/A;  . SUBCLAVIAN ANGIOGRAM  Nov. 8, 2016  . TOTAL HIP ARTHROPLASTY Left 01/03/2013   Procedure: TOTAL HIP ARTHROPLASTY ANTERIOR APPROACH;  Surgeon: Gearlean Alf, MD;  [facility];  Service: Orthopedics;  Laterality: Left;  . TOTAL KNEE ARTHROPLASTY Right 10/06/2014   Procedure: RIGHT TOTAL KNEE ARTHROPLASTY;  Surgeon: Gearlean Alf, MD;  [facility];  Service: Orthopedics;  Laterality: Right;    Family Psychiatric History: See below  Family History:  Family History  Problem Relation Age of Onset  . Arrhythmia Mother        Has pacemaker  . Hypertension Mother   . Heart disease Mother   . Mental illness Mother   . Varicose Veins Mother   . Depression Mother   . Colon cancer Father   . Cancer Father        Prostate and Colon  . Diabetes Father   . Hypertension Father   . Cancer - Colon Father   . Depression Sister     Social History:  Social History   Socioeconomic History  . Marital status: Married    Spouse name: None  . Number of children: None  . Years of education: None  . Highest education level: None  Social Needs  . Financial resource strain: None  . Food insecurity - worry: None  . Food insecurity - inability: None   . Transportation needs - medical: None  . Transportation needs - non-medical: None  Occupational History  . None  Tobacco Use  . Smoking status: Current Every Day Smoker    Packs/day: 0.50    Types: Cigarettes    Start date: 05/23/2015  . Smokeless tobacco: Former Systems developer  . Tobacco comment: Stated "I smoke off and on"  Substance and Sexual Activity  . Alcohol use: No    Alcohol/week: 0.0 oz    Comment: 10-12-15 per pt no  . Drug use: No    Comment: 10-12-15 per tp no  . Sexual activity: Yes  Other Topics Concern  .  None  Social History Narrative  . None    Allergies:  Allergies  Allergen Reactions  . Aspirin Other (See Comments)    Do not take with depakote.   . Erythromycin     Caused a "twisted stomach"  . Penicillins Rash    Metabolic Disorder Labs: No results found for: HGBA1C, MPG No results found for: PROLACTIN No results found for: CHOL, TRIG, HDL, CHOLHDL, VLDL, LDLCALC No results found for: TSH  Therapeutic Level Labs: No results found for: LITHIUM No results found for: VALPROATE No components found for:  CBMZ  Current Medications: Current Outpatient Medications  Medication Sig Dispense Refill  . aspirin 325 MG tablet Take 325 mg by mouth daily.    Marland Kitchen dexlansoprazole (DEXILANT) 60 MG capsule Take 60 mg by mouth every morning.     . Dutasteride-Tamsulosin HCl (JALYN) 0.5-0.4 MG CAPS Take 1 capsule by mouth daily at 12 noon.     . ferrous sulfate 325 (65 FE) MG tablet Take 325 mg by mouth daily with breakfast.    . HYDROcodone-acetaminophen (NORCO) 10-325 MG tablet Take 1 tablet by mouth every 6 (six) hours as needed for moderate pain.    Marland Kitchen ibuprofen (ADVIL,MOTRIN) 200 MG tablet Take 800 mg by mouth every 8 (eight) hours as needed for mild pain or moderate pain.    Marland Kitchen ipratropium (ATROVENT) 0.03 % nasal spray Place 2 sprays into both nostrils every 12 (twelve) hours as needed for rhinitis. Reported on 10/23/2015    . lamoTRIgine (LAMICTAL) 200 MG tablet Take 1  tablet (200 mg total) by mouth at bedtime. 30 tablet 2  . levalbuterol (XOPENEX HFA) 45 MCG/ACT inhaler Inhale 2 puffs into the lungs every 6 (six) hours as needed for wheezing.    Marland Kitchen levocetirizine (XYZAL) 5 MG tablet Take 1 tablet by mouth at bedtime.    Marland Kitchen lisdexamfetamine (VYVANSE) 70 MG capsule Take 1 capsule (70 mg total) by mouth daily. 30 capsule 0  . lisdexamfetamine (VYVANSE) 70 MG capsule Take 1 capsule (70 mg total) by mouth daily. 30 capsule 0  . lisdexamfetamine (VYVANSE) 70 MG capsule Take 1 capsule (70 mg total) by mouth every morning. 30 capsule 0  . montelukast (SINGULAIR) 10 MG tablet Take 10 mg by mouth every morning.     . Multiple Vitamins-Minerals (MULTIVITAMIN WITH MINERALS) tablet Take 1 tablet by mouth daily.    . Pediatric Multivitamins-Iron (FLINTSTONES PLUS IRON) chewable tablet Chew 1 tablet by mouth 2 (two) times daily.    . promethazine (PHENERGAN) 25 MG tablet Take 25 mg by mouth every 6 (six) hours as needed for nausea or vomiting.    . ranitidine (ZANTAC) 150 MG capsule Take 150 mg by mouth daily as needed for heartburn.    . tadalafil (CIALIS) 20 MG tablet Take 20 mg by mouth daily as needed for erectile dysfunction.    Marland Kitchen testosterone cypionate (DEPOTESTOTERONE CYPIONATE) 200 MG/ML injection Inject 200 mg into the muscle every 14 (fourteen) days.     Marland Kitchen losartan (COZAAR) 25 MG tablet      No current facility-administered medications for this visit.      Musculoskeletal: Strength & Muscle Tone: within normal limits Gait & Station: normal Patient leans: N/A  Psychiatric Specialty Exam: Review of Systems  Psychiatric/Behavioral: The patient is nervous/anxious.   All other systems reviewed and are negative.   Blood pressure (!) 142/96, pulse (!) 107, height 5\' 11"  (1.803 m), weight 214 lb (97.1 kg), SpO2 97 %.Body mass index is 29.85  kg/m.  General Appearance: Casual, Neat and Well Groomed  Eye Contact:  Good  Speech:  Clear and Coherent  Volume:  Normal   Mood:  Anxious  Affect:  Appropriate  Thought Process:  Goal Directed  Orientation:  Full (Time, Place, and Person)  Thought Content: Rumination   Suicidal Thoughts:  No  Homicidal Thoughts: no  Memory:  Immediate;   Good Recent;   Good Remote;   Fair  Judgement:  Fair  Insight:  Fair  Psychomotor Activity:  Normal  Concentration:  Concentration: Good and Attention Span: Good on medication  Recall:  Good  Fund of Knowledge: Good  Language: Good  Akathisia:  No  Handed:  Right  AIMS (if indicated): not done  Assets:  Communication Skills Desire for Improvement Physical Health Resilience Social Support Talents/Skills  ADL's:  Intact  Cognition: WNL  Sleep:  Good   Screenings:   Assessment and Plan: Patient is a 61 year old male with a history of mood swings and irritability.  The Lamictal 200 mg at bedtime seems to be helping this.  The Vyvanse continues to help with his focus.  He is working to get his blood pressure down by cutting down his cigarettes and is also now on losartan.  He will return to see me in 3 months   [name], MD 08/10/2017, 11:09 AM

## 2017-08-20 DIAGNOSIS — I10 Essential (primary) hypertension: Secondary | ICD-10-CM | POA: Diagnosis not present

## 2017-08-20 DIAGNOSIS — R05 Cough: Secondary | ICD-10-CM | POA: Diagnosis not present

## 2017-08-20 DIAGNOSIS — K140 Glossitis: Secondary | ICD-10-CM | POA: Diagnosis not present

## 2017-09-03 DIAGNOSIS — I10 Essential (primary) hypertension: Secondary | ICD-10-CM | POA: Diagnosis not present

## 2017-09-03 DIAGNOSIS — R05 Cough: Secondary | ICD-10-CM | POA: Diagnosis not present

## 2017-09-03 DIAGNOSIS — K140 Glossitis: Secondary | ICD-10-CM | POA: Diagnosis not present

## 2017-09-05 DIAGNOSIS — J069 Acute upper respiratory infection, unspecified: Secondary | ICD-10-CM | POA: Diagnosis not present

## 2017-09-05 DIAGNOSIS — Z6831 Body mass index (BMI) 31.0-31.9, adult: Secondary | ICD-10-CM | POA: Diagnosis not present

## 2017-09-05 DIAGNOSIS — I10 Essential (primary) hypertension: Secondary | ICD-10-CM | POA: Diagnosis not present

## 2017-09-05 DIAGNOSIS — R05 Cough: Secondary | ICD-10-CM | POA: Diagnosis not present

## 2017-09-10 DIAGNOSIS — R05 Cough: Secondary | ICD-10-CM | POA: Diagnosis not present

## 2017-09-10 DIAGNOSIS — J069 Acute upper respiratory infection, unspecified: Secondary | ICD-10-CM | POA: Diagnosis not present

## 2017-09-10 DIAGNOSIS — Z6831 Body mass index (BMI) 31.0-31.9, adult: Secondary | ICD-10-CM | POA: Diagnosis not present

## 2017-09-10 DIAGNOSIS — R140 Abdominal distension (gaseous): Secondary | ICD-10-CM | POA: Diagnosis not present

## 2017-09-14 DIAGNOSIS — R252 Cramp and spasm: Secondary | ICD-10-CM | POA: Diagnosis not present

## 2017-09-14 DIAGNOSIS — I10 Essential (primary) hypertension: Secondary | ICD-10-CM | POA: Diagnosis not present

## 2017-09-14 DIAGNOSIS — J019 Acute sinusitis, unspecified: Secondary | ICD-10-CM | POA: Diagnosis not present

## 2017-09-14 DIAGNOSIS — Z6831 Body mass index (BMI) 31.0-31.9, adult: Secondary | ICD-10-CM | POA: Diagnosis not present

## 2017-09-16 DIAGNOSIS — I671 Cerebral aneurysm, nonruptured: Secondary | ICD-10-CM | POA: Diagnosis not present

## 2017-09-16 DIAGNOSIS — Z6830 Body mass index (BMI) 30.0-30.9, adult: Secondary | ICD-10-CM | POA: Diagnosis not present

## 2017-09-16 DIAGNOSIS — R030 Elevated blood-pressure reading, without diagnosis of hypertension: Secondary | ICD-10-CM | POA: Diagnosis not present

## 2017-09-22 ENCOUNTER — Other Ambulatory Visit (HOSPITAL_COMMUNITY): Payer: Self-pay | Admitting: Neurosurgery

## 2017-09-22 DIAGNOSIS — I10 Essential (primary) hypertension: Secondary | ICD-10-CM | POA: Diagnosis not present

## 2017-09-22 DIAGNOSIS — Z6831 Body mass index (BMI) 31.0-31.9, adult: Secondary | ICD-10-CM | POA: Diagnosis not present

## 2017-09-22 DIAGNOSIS — R140 Abdominal distension (gaseous): Secondary | ICD-10-CM | POA: Diagnosis not present

## 2017-09-22 DIAGNOSIS — R252 Cramp and spasm: Secondary | ICD-10-CM | POA: Diagnosis not present

## 2017-09-22 DIAGNOSIS — J019 Acute sinusitis, unspecified: Secondary | ICD-10-CM | POA: Diagnosis not present

## 2017-09-22 DIAGNOSIS — I671 Cerebral aneurysm, nonruptured: Secondary | ICD-10-CM

## 2017-09-23 DIAGNOSIS — Z961 Presence of intraocular lens: Secondary | ICD-10-CM | POA: Diagnosis not present

## 2017-09-23 DIAGNOSIS — H43811 Vitreous degeneration, right eye: Secondary | ICD-10-CM | POA: Diagnosis not present

## 2017-09-23 DIAGNOSIS — H2511 Age-related nuclear cataract, right eye: Secondary | ICD-10-CM | POA: Diagnosis not present

## 2017-09-23 DIAGNOSIS — H43812 Vitreous degeneration, left eye: Secondary | ICD-10-CM | POA: Diagnosis not present

## 2017-09-23 DIAGNOSIS — H40033 Anatomical narrow angle, bilateral: Secondary | ICD-10-CM | POA: Diagnosis not present

## 2017-09-28 DIAGNOSIS — I671 Cerebral aneurysm, nonruptured: Secondary | ICD-10-CM | POA: Diagnosis not present

## 2017-09-29 DIAGNOSIS — I671 Cerebral aneurysm, nonruptured: Secondary | ICD-10-CM | POA: Diagnosis not present

## 2017-09-29 DIAGNOSIS — I720 Aneurysm of carotid artery: Secondary | ICD-10-CM | POA: Diagnosis not present

## 2017-10-01 DIAGNOSIS — Z6830 Body mass index (BMI) 30.0-30.9, adult: Secondary | ICD-10-CM | POA: Diagnosis not present

## 2017-10-01 DIAGNOSIS — R030 Elevated blood-pressure reading, without diagnosis of hypertension: Secondary | ICD-10-CM | POA: Diagnosis not present

## 2017-10-01 DIAGNOSIS — I671 Cerebral aneurysm, nonruptured: Secondary | ICD-10-CM | POA: Diagnosis not present

## 2017-10-14 ENCOUNTER — Telehealth (HOSPITAL_COMMUNITY): Payer: Self-pay | Admitting: *Deleted

## 2017-10-14 NOTE — Telephone Encounter (Signed)
Dr Harrington Challenger  Patient's wife(Faye) has called in stating that she very afraid. Patient isn't taking medication & behavioral issues are Frightening. She stated that if he knew she was calling well " I'm deathly afraid". Asked for a call back on # [phone]

## 2017-10-14 NOTE — Telephone Encounter (Signed)
Per wife patient is drinking and becomes violent and threatening. Advised her to let police know or get a restraining order. Also, she can file petition for invol commitment. I offered to see him sooner but he needs alcohol treatment. She will think over these options. I have told her that if she feels unsafe she needs to leave and get a restraining order

## 2017-10-19 DIAGNOSIS — K149 Disease of tongue, unspecified: Secondary | ICD-10-CM | POA: Diagnosis not present

## 2017-10-19 DIAGNOSIS — N400 Enlarged prostate without lower urinary tract symptoms: Secondary | ICD-10-CM | POA: Diagnosis not present

## 2017-10-19 DIAGNOSIS — J069 Acute upper respiratory infection, unspecified: Secondary | ICD-10-CM | POA: Diagnosis not present

## 2017-10-19 DIAGNOSIS — D649 Anemia, unspecified: Secondary | ICD-10-CM | POA: Diagnosis not present

## 2017-10-19 DIAGNOSIS — M542 Cervicalgia: Secondary | ICD-10-CM | POA: Diagnosis not present

## 2017-10-19 DIAGNOSIS — K140 Glossitis: Secondary | ICD-10-CM | POA: Diagnosis not present

## 2017-10-19 DIAGNOSIS — J019 Acute sinusitis, unspecified: Secondary | ICD-10-CM | POA: Diagnosis not present

## 2017-10-19 DIAGNOSIS — R140 Abdominal distension (gaseous): Secondary | ICD-10-CM | POA: Diagnosis not present

## 2017-10-19 DIAGNOSIS — R252 Cramp and spasm: Secondary | ICD-10-CM | POA: Diagnosis not present

## 2017-10-19 DIAGNOSIS — R05 Cough: Secondary | ICD-10-CM | POA: Diagnosis not present

## 2017-10-19 DIAGNOSIS — D291 Benign neoplasm of prostate: Secondary | ICD-10-CM | POA: Diagnosis not present

## 2017-10-19 DIAGNOSIS — Z6831 Body mass index (BMI) 31.0-31.9, adult: Secondary | ICD-10-CM | POA: Diagnosis not present

## 2017-10-21 DIAGNOSIS — K219 Gastro-esophageal reflux disease without esophagitis: Secondary | ICD-10-CM | POA: Diagnosis not present

## 2017-10-21 DIAGNOSIS — G629 Polyneuropathy, unspecified: Secondary | ICD-10-CM | POA: Diagnosis not present

## 2017-10-21 DIAGNOSIS — M25562 Pain in left knee: Secondary | ICD-10-CM | POA: Diagnosis not present

## 2017-10-21 DIAGNOSIS — R030 Elevated blood-pressure reading, without diagnosis of hypertension: Secondary | ICD-10-CM | POA: Diagnosis not present

## 2017-10-21 DIAGNOSIS — F319 Bipolar disorder, unspecified: Secondary | ICD-10-CM | POA: Diagnosis not present

## 2017-10-21 DIAGNOSIS — E291 Testicular hypofunction: Secondary | ICD-10-CM | POA: Diagnosis not present

## 2017-10-21 DIAGNOSIS — G8929 Other chronic pain: Secondary | ICD-10-CM | POA: Diagnosis not present

## 2017-10-21 DIAGNOSIS — J449 Chronic obstructive pulmonary disease, unspecified: Secondary | ICD-10-CM | POA: Diagnosis not present

## 2017-10-21 DIAGNOSIS — R252 Cramp and spasm: Secondary | ICD-10-CM | POA: Diagnosis not present

## 2017-10-21 DIAGNOSIS — I720 Aneurysm of carotid artery: Secondary | ICD-10-CM | POA: Diagnosis not present

## 2017-10-21 DIAGNOSIS — Z96652 Presence of left artificial knee joint: Secondary | ICD-10-CM | POA: Diagnosis not present

## 2017-10-21 DIAGNOSIS — N400 Enlarged prostate without lower urinary tract symptoms: Secondary | ICD-10-CM | POA: Diagnosis not present

## 2017-10-26 DIAGNOSIS — E291 Testicular hypofunction: Secondary | ICD-10-CM | POA: Diagnosis not present

## 2017-11-02 DIAGNOSIS — J069 Acute upper respiratory infection, unspecified: Secondary | ICD-10-CM | POA: Diagnosis not present

## 2017-11-02 DIAGNOSIS — R05 Cough: Secondary | ICD-10-CM | POA: Diagnosis not present

## 2017-11-04 DIAGNOSIS — R05 Cough: Secondary | ICD-10-CM | POA: Diagnosis not present

## 2017-11-06 ENCOUNTER — Encounter (HOSPITAL_COMMUNITY): Payer: Self-pay | Admitting: Psychiatry

## 2017-11-06 ENCOUNTER — Ambulatory Visit (HOSPITAL_COMMUNITY): Payer: Self-pay | Admitting: Psychiatry

## 2017-11-06 ENCOUNTER — Ambulatory Visit (HOSPITAL_COMMUNITY): Payer: PPO | Admitting: Psychiatry

## 2017-11-06 VITALS — BP 163/98 | HR 103 | Ht 71.0 in | Wt 218.0 lb

## 2017-11-06 DIAGNOSIS — F3162 Bipolar disorder, current episode mixed, moderate: Secondary | ICD-10-CM

## 2017-11-06 DIAGNOSIS — Z818 Family history of other mental and behavioral disorders: Secondary | ICD-10-CM

## 2017-11-06 DIAGNOSIS — Z736 Limitation of activities due to disability: Secondary | ICD-10-CM

## 2017-11-06 DIAGNOSIS — F900 Attention-deficit hyperactivity disorder, predominantly inattentive type: Secondary | ICD-10-CM | POA: Diagnosis not present

## 2017-11-06 DIAGNOSIS — F17210 Nicotine dependence, cigarettes, uncomplicated: Secondary | ICD-10-CM | POA: Diagnosis not present

## 2017-11-06 DIAGNOSIS — G894 Chronic pain syndrome: Secondary | ICD-10-CM

## 2017-11-06 MED ORDER — LAMOTRIGINE 200 MG PO TABS: 200.0000 mg | ORAL_TABLET | Freq: Every day | ORAL | 2 refills | Status: DC

## 2017-11-06 MED ORDER — LISDEXAMFETAMINE DIMESYLATE 70 MG PO CAPS: 70.0000 mg | ORAL_CAPSULE | Freq: Every day | ORAL | 0 refills | Status: DC

## 2017-11-06 NOTE — Progress Notes (Signed)
[facility] MD/PA/NP OP Progress Note  11/06/2017 10:40 AM Philip Gates  MRN:  [id number]  Chief Complaint:  Chief Complaint    Anxiety; Agitation; ADHD; Follow-up     HPI: This patient is a 62 year old married white male lives with his wife in [place]. He used to work as a Consulting civil engineer for a [employer] but has been retired for about one year on disability.  The patient was referred by his primary physician, Dr. Wende Neighbors, for further assessment and treatment of bipolar disorder and ADHD.  The patient is a vague historian and he tends to ramble a lot. Apparently as a child he was very unfocused and hyperactive. He is always had difficulty completing tasks. He was never diagnosed with ADHD in childhood and he was able to complete high school and 1 year of machine shop training. He has worked in numerous jobs in Film/video editor.  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 He quit all this about 10 years ago. About 7 years ago he became increasingly angry and moody and irritable. Some of it started when he and his wife are building a house and he felt like the contractor "screwed me out of $100,000.". He began seeing Dr. Candis Schatz in [place] and was diagnosed as being bipolar because he had significant mood swings anger and irritability. Since that he's been on a combination of Lamictal and Depakote which he thinks has helped. Because of his long-term difficulties with focus and concentration he was started on Vyvanse which he also thinks has helped.  The patient still has some difficulties with irritability but not like he used to. He and his wife don't get along but he admits that he was having an affair with an old girlfriend for the last few years and still talks to this woman periodically. His wife found out about it and is quite resentful. He also has significant memory problems and often loses his train of thought. He  denies any head injuries. 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 He has numerous medical problems and has chronic pain from arthritis. Last year he also had knee surgery and is well as a small bowel obstruction.  The patient no longer uses drugs or alcohol. He is active in church and does a lot of things with his cousin. His energy is pretty good and he states he sleeps fairly well. He has never had psychiatric hospitalization or treatment for drug or alcohol abuse  Patient returns after 3 months.  His wife had called me earlier this month and stated that she was deathly afraid of him.  She claims he had been drinking heavily and was getting violent and abusive.  She denies that he was physically abusive but verbally was quite abusive.  I advised her to call the police get a restraining order or leave.  I also explained the involuntary petition for commitment program.  Apparently none of this happened because they are still together.  He states that he has not drank since that incident.  He claims that his wife is also to blame because she "pushes my buttons."  I offered to get them into family counseling here but he declined.  He states he is tried this several times and it does not work.  He claims he is going to stop drinking and that he is going to be more compliant with his medication.  He denies any  thoughts of hurting himself or his wife today Visit Diagnosis:    ICD-10-CM   1. Bipolar 1 disorder, mixed, moderate (HCC) F31.62   2. Attention deficit hyperactivity disorder (ADHD), predominantly inattentive type F90.0 lisdexamfetamine (VYVANSE) 70 MG capsule    Past Psychiatric History: Past outpatient treatment for bipolar disorder  Past Medical History:  Past Medical History:  Diagnosis Date  . Acid reflux   . Aneurysm (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 Arthritis   . Asthma    ACTIVITY INDUCED  . Bipolar 1 disorder ([place])   . BPH (benign  prostatic hyperplasia)   . Cellulitis 05/2014   right side face  . Cerebral aneurysm    has a stent  . GERD (gastroesophageal reflux disease)   . Neck pain, chronic   . Sinus drainage   . 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  . Venous insufficiency     Past Surgical History:  Procedure Laterality Date  . cerebral stent    . COLONOSCOPY N/A 01/02/2017   Procedure: COLONOSCOPY;  Surgeon: Rogene Houston, MD;  [facility];  Service: Endoscopy;  Laterality: N/A;  . dental implant  09/26/2014   left lower dental implant  . ESOPHAGOGASTRODUODENOSCOPY N/A 01/02/2017   Procedure: ESOPHAGOGASTRODUODENOSCOPY (EGD);  Surgeon: Rogene Houston, MD;  [facility];  Service: Endoscopy;  Laterality: N/A;  910  . EYE SURGERY Left Jan. 16, 2017   Cataract  . IR ANGIO INTRA EXTRACRAN SEL INTERNAL CAROTID BILAT MOD SED  01/20/2017  . IR ANGIO VERTEBRAL SEL VERTEBRAL UNI L MOD SED  01/20/2017  . JOINT REPLACEMENT Right    Knee  . JOINT REPLACEMENT Left    Hip  . NOSE SURGERY    . RADIOLOGY WITH ANESTHESIA N/A 12/29/2014   Procedure: Embolization;  Surgeon: Consuella Lose, MD;  Location: [facility];  Service: Radiology;  Laterality: N/A;  . SUBCLAVIAN ANGIOGRAM  Nov. 8, 2016  . TOTAL HIP ARTHROPLASTY Left 01/03/2013   Procedure: TOTAL HIP ARTHROPLASTY ANTERIOR APPROACH;  Surgeon: Gearlean Alf, MD;  Location: [facility];  Service: Orthopedics;  Laterality: Left;  . TOTAL KNEE ARTHROPLASTY Right 10/06/2014   Procedure: RIGHT TOTAL KNEE ARTHROPLASTY;  Surgeon: Gearlean Alf, MD;  Location: [facility];  Service: Orthopedics;  Laterality: Right;    Family Psychiatric History: See below  Family History:  Family History  Problem Relation Age of Onset  . Arrhythmia Mother        Has pacemaker  . Hypertension Mother   . Heart disease Mother   . Mental illness Mother   . Varicose Veins Mother   . Depression Mother   . Colon cancer Father   .  Cancer Father        Prostate and Colon  . Diabetes Father   . Hypertension Father   . Cancer - Colon Father   . Depression Sister     Social History:  Social History   Socioeconomic History  . Marital status: Married    Spouse name: None  . Number of children: None  . Years of education: None  . Highest education level: None  Social Needs  . Financial resource strain: None  . Food insecurity - worry: None  . Food insecurity - inability: None  . Transportation needs - medical: None  . Transportation needs - non-medical: None  Occupational History  . None  Tobacco Use  .  Smoking status: Current Every Day Smoker    Packs/day: 0.50    Types: Cigarettes    Start date: 05/23/2015  . Smokeless tobacco: Former Systems developer  . Tobacco comment: Stated "I smoke off and on"  Substance and Sexual Activity  . Alcohol use: No    Alcohol/week: 0.0 oz    Comment: 10-12-15 per pt no  . Drug use: No    Comment: 10-12-15 per tp no  . Sexual activity: Yes  Other Topics Concern  . None  Social History Narrative  . None    Allergies:  Allergies  Allergen Reactions  . Aspirin Other (See Comments)    Do not take with depakote.   . Erythromycin     Caused a "twisted stomach"  . Penicillins Rash    Metabolic Disorder Labs: No results found for: HGBA1C, MPG No results found for: PROLACTIN No results found for: CHOL, TRIG, HDL, CHOLHDL, VLDL, LDLCALC No results found for: TSH  Therapeutic Level Labs: No results found for: LITHIUM No results found for: VALPROATE No components found for:  CBMZ  Current Medications: Current Outpatient Medications  Medication Sig Dispense Refill  . aspirin 325 MG tablet Take 325 mg by mouth daily.    Marland Kitchen dexlansoprazole (DEXILANT) 60 MG capsule Take 60 mg by mouth every morning.     . Dutasteride-Tamsulosin HCl (JALYN) 0.5-0.4 MG CAPS Take 1 capsule by mouth daily at 12 noon.     . ferrous sulfate 325 (65 FE) MG tablet Take 325 mg by mouth daily with  breakfast.    . HYDROcodone-acetaminophen (NORCO) 10-325 MG tablet Take 1 tablet by mouth every 6 (six) hours as needed for moderate pain.    Marland Kitchen ibuprofen (ADVIL,MOTRIN) 200 MG tablet Take 800 mg by mouth every 8 (eight) hours as needed for mild pain or moderate pain.    Marland Kitchen ipratropium (ATROVENT) 0.03 % nasal spray Place 2 sprays into both nostrils every 12 (twelve) hours as needed for rhinitis. Reported on 10/23/2015    . lamoTRIgine (LAMICTAL) 200 MG tablet Take 1 tablet (200 mg total) by mouth at bedtime. 30 tablet 2  . levalbuterol (XOPENEX HFA) 45 MCG/ACT inhaler Inhale 2 puffs into the lungs every 6 (six) hours as needed for wheezing.    Marland Kitchen levocetirizine (XYZAL) 5 MG tablet Take 1 tablet by mouth at bedtime.    Marland Kitchen lisdexamfetamine (VYVANSE) 70 MG capsule Take 1 capsule (70 mg total) by mouth every morning. 30 capsule 0  . lisdexamfetamine (VYVANSE) 70 MG capsule Take 1 capsule (70 mg total) by mouth daily. 30 capsule 0  . lisdexamfetamine (VYVANSE) 70 MG capsule Take 1 capsule (70 mg total) by mouth daily. 30 capsule 0  . losartan (COZAAR) 25 MG tablet     . montelukast (SINGULAIR) 10 MG tablet Take 10 mg by mouth every morning.     . Multiple Vitamins-Minerals (MULTIVITAMIN WITH MINERALS) tablet Take 1 tablet by mouth daily.    . Pediatric Multivitamins-Iron (FLINTSTONES PLUS IRON) chewable tablet Chew 1 tablet by mouth 2 (two) times daily.    . promethazine (PHENERGAN) 25 MG tablet Take 25 mg by mouth every 6 (six) hours as needed for nausea or vomiting.    . ranitidine (ZANTAC) 150 MG capsule Take 150 mg by mouth daily as needed for heartburn.    . tadalafil (CIALIS) 20 MG tablet Take 20 mg by mouth daily as needed for erectile dysfunction.    Marland Kitchen testosterone cypionate (DEPOTESTOTERONE CYPIONATE) 200 MG/ML injection Inject 200 mg into  the muscle every 14 (fourteen) days.      No current facility-administered medications for this visit.      Musculoskeletal: Strength & Muscle Tone: within  normal limits Gait & Station: normal Patient leans: N/A  Psychiatric Specialty Exam: Review of Systems  HENT: Positive for congestion.   All other systems reviewed and are negative.   Blood pressure (!) 163/98, pulse (!) 103, height 5\' 11"  (1.803 m), weight 218 lb (98.9 kg), SpO2 97 %.Body mass index is 30.4 kg/m.  General Appearance: Casual and Fairly Groomed  Eye Contact:  Fair  Speech:  Clear and Coherent  Volume:  Normal  Mood:  Irritable  Affect:  Constricted  Thought Process:  Goal Directed  Orientation:  Full (Time, Place, and Person)  Thought Content: Rumination   Suicidal Thoughts:  No  Homicidal Thoughts:  No  Memory:  Immediate;   Good Recent;   Good Remote;   NA  Judgement:  Poor  Insight:  Lacking  Psychomotor Activity:  Normal  Concentration:  Concentration: Good and Attention Span: Good  Recall:  Good  Fund of Knowledge: Good  Language: Good  Akathisia:  No  Handed:  Right  AIMS (if indicated): not done  Assets:  Communication Skills Desire for Improvement Physical Health Resilience Social Support Talents/Skills  ADL's:  Intact  Cognition: WNL  Sleep:  Good   Screenings:   Assessment and Plan: This patient is a 62 year old male with a history of ADHD bipolar disorder significant anger problems.  He also obviously has a drinking issue.  He claims he has quit drinking.  He does not want referrals to any sort of alcohol treatment programs or further counseling.  I have explained to him that this is all I can offer and I have explained this to his wife as well.  At this point he does not appear to be dangerous to self or others.  He claims he will be more compliant with the Lamictal 200 mg at bedtime which helps his mood and irritability as well as the Vyvanse 70 mg every morning for focus.  He will return to see me in 2 months   [name], MD 11/06/2017, 10:40 AM

## 2017-11-25 DIAGNOSIS — Z961 Presence of intraocular lens: Secondary | ICD-10-CM | POA: Diagnosis not present

## 2017-11-25 DIAGNOSIS — H43812 Vitreous degeneration, left eye: Secondary | ICD-10-CM | POA: Diagnosis not present

## 2017-11-25 DIAGNOSIS — H2511 Age-related nuclear cataract, right eye: Secondary | ICD-10-CM | POA: Diagnosis not present

## 2017-11-25 DIAGNOSIS — H40033 Anatomical narrow angle, bilateral: Secondary | ICD-10-CM | POA: Diagnosis not present

## 2017-11-25 DIAGNOSIS — H43811 Vitreous degeneration, right eye: Secondary | ICD-10-CM | POA: Diagnosis not present

## 2017-11-27 ENCOUNTER — Encounter: Payer: Self-pay | Admitting: Podiatry

## 2017-11-27 ENCOUNTER — Ambulatory Visit: Payer: PPO

## 2017-11-27 ENCOUNTER — Ambulatory Visit: Payer: PPO | Admitting: Podiatry

## 2017-11-27 VITALS — BP 144/91 | HR 111 | Resp 16

## 2017-11-27 DIAGNOSIS — L603 Nail dystrophy: Secondary | ICD-10-CM | POA: Diagnosis not present

## 2017-11-27 DIAGNOSIS — B351 Tinea unguium: Secondary | ICD-10-CM

## 2017-11-27 DIAGNOSIS — J019 Acute sinusitis, unspecified: Secondary | ICD-10-CM | POA: Diagnosis not present

## 2017-11-27 DIAGNOSIS — Z6830 Body mass index (BMI) 30.0-30.9, adult: Secondary | ICD-10-CM | POA: Diagnosis not present

## 2017-11-27 MED ORDER — TERBINAFINE HCL 250 MG PO TABS: 250.0000 mg | ORAL_TABLET | Freq: Every day | ORAL | 0 refills | Status: DC

## 2017-11-30 NOTE — Progress Notes (Signed)
Subjective:   Patient ID: Philip Gates, male   DOB: 62 y.o.   MRN: [id number]   HPI Patient presents stating he is here for thick discolored nailbeds 1-5 both feet and that is tried topical medicines has family history about this and is been going on for a number of years.  Patient states that it is gradually becoming more discoloring for him   Review of Systems  All other systems reviewed and are negative.       Objective:  Physical Exam  Constitutional: He appears well-developed and well-nourished.  Cardiovascular: Intact distal pulses.  Pulmonary/Chest: Effort normal.  Musculoskeletal: Normal range of motion.  Neurological: He is alert.  Skin: Skin is warm.  Nursing note and vitals reviewed.   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  I did educate him on oral and the risk of oral and is willing to accept this and at this point he is scheduled for laser and started that today and will have that done on a monthly basis.  Patient understands that improvement will take upwards of 6 months to 1 year for this condition

## 2017-12-08 NOTE — Progress Notes (Signed)
Pt presents with mycotic infection of nails 1-5 bilateral  All other systems are negative  Laser therapy administered to affected nails and tolerated well. All safety precautions were in place. Re-appointed in 4 weeks for 2nd treatment 

## 2017-12-28 ENCOUNTER — Ambulatory Visit: Payer: Self-pay

## 2017-12-28 DIAGNOSIS — L603 Nail dystrophy: Secondary | ICD-10-CM

## 2017-12-28 DIAGNOSIS — L570 Actinic keratosis: Secondary | ICD-10-CM | POA: Diagnosis not present

## 2017-12-28 DIAGNOSIS — D485 Neoplasm of uncertain behavior of skin: Secondary | ICD-10-CM | POA: Diagnosis not present

## 2017-12-28 DIAGNOSIS — L821 Other seborrheic keratosis: Secondary | ICD-10-CM | POA: Diagnosis not present

## 2017-12-28 DIAGNOSIS — L918 Other hypertrophic disorders of the skin: Secondary | ICD-10-CM | POA: Diagnosis not present

## 2017-12-28 DIAGNOSIS — B351 Tinea unguium: Secondary | ICD-10-CM

## 2017-12-28 DIAGNOSIS — Z85828 Personal history of other malignant neoplasm of skin: Secondary | ICD-10-CM | POA: Diagnosis not present

## 2018-01-05 NOTE — Progress Notes (Signed)
Pt presents with mycotic infection of nails 1-5 bilateral  All other systems are negative  Laser therapy administered to affected nails and tolerated well. All safety precautions were in place. Re-appointed in 4 weeks for 3rd treatment 

## 2018-01-06 ENCOUNTER — Other Ambulatory Visit (HOSPITAL_COMMUNITY): Payer: Self-pay | Admitting: Psychiatry

## 2018-01-06 ENCOUNTER — Telehealth (HOSPITAL_COMMUNITY): Payer: Self-pay

## 2018-01-06 MED ORDER — LISDEXAMFETAMINE DIMESYLATE 70 MG PO CAPS: 70.0000 mg | ORAL_CAPSULE | Freq: Every day | ORAL | 0 refills | Status: DC

## 2018-01-06 NOTE — Telephone Encounter (Signed)
I do not see any vyvanse in PMP AWARE database. Could you contact the [facility] and ask the dates he received Vyvanse refill this month.

## 2018-01-06 NOTE — Telephone Encounter (Signed)
Contacted pharmacy and per pharmacist Tripp he said that the last refill was April 2nd.

## 2018-01-06 NOTE — Telephone Encounter (Signed)
Ordered for a month.

## 2018-01-06 NOTE — Telephone Encounter (Signed)
Patient called and requesting refills on Vyvanse 70mg . Patient next appointment is 01-11-18. Script to be called in at [facility]. Please advise

## 2018-01-07 ENCOUNTER — Ambulatory Visit (HOSPITAL_COMMUNITY): Payer: PPO | Admitting: Psychiatry

## 2018-01-07 NOTE — Telephone Encounter (Signed)
Called and spoke with patient to inform him that the medication has been sent to the pharmacy

## 2018-01-11 ENCOUNTER — Encounter (HOSPITAL_COMMUNITY): Payer: Self-pay | Admitting: Psychiatry

## 2018-01-11 ENCOUNTER — Ambulatory Visit (HOSPITAL_COMMUNITY): Payer: PPO | Admitting: Psychiatry

## 2018-01-11 VITALS — BP 166/96 | HR 90 | Ht 70.0 in | Wt 218.0 lb

## 2018-01-11 DIAGNOSIS — F900 Attention-deficit hyperactivity disorder, predominantly inattentive type: Secondary | ICD-10-CM

## 2018-01-11 DIAGNOSIS — Z818 Family history of other mental and behavioral disorders: Secondary | ICD-10-CM

## 2018-01-11 DIAGNOSIS — Z736 Limitation of activities due to disability: Secondary | ICD-10-CM | POA: Diagnosis not present

## 2018-01-11 DIAGNOSIS — M2550 Pain in unspecified joint: Secondary | ICD-10-CM | POA: Diagnosis not present

## 2018-01-11 DIAGNOSIS — Z87891 Personal history of nicotine dependence: Secondary | ICD-10-CM

## 2018-01-11 DIAGNOSIS — F3162 Bipolar disorder, current episode mixed, moderate: Secondary | ICD-10-CM

## 2018-01-11 MED ORDER — LAMOTRIGINE 200 MG PO TABS: 200.0000 mg | ORAL_TABLET | Freq: Every day | ORAL | 2 refills | Status: DC

## 2018-01-11 MED ORDER — LISDEXAMFETAMINE DIMESYLATE 70 MG PO CAPS: 70.0000 mg | ORAL_CAPSULE | Freq: Every day | ORAL | 0 refills | Status: DC

## 2018-01-11 NOTE — Progress Notes (Signed)
[facility] MD/PA/NP OP Progress Note  01/11/2018 9:53 AM Philip Gates  MRN:  [id number]  Chief Complaint:  Chief Complaint    ADHD; Anxiety     HPI: This patient is a 62 year old married white male lives with his wife in [place]. He used to work as a Consulting civil engineer for a [employer] but has been retired for about one year on disability.  The patient was referred by his primary physician, Dr. Wende Neighbors, for further assessment and treatment of bipolar disorder and ADHD.  The patient is a vague historian and he tends to ramble a lot. Apparently as a child he was very unfocused and hyperactive. He is always had difficulty completing tasks. He was never diagnosed with ADHD in childhood and he was able to complete high school and 1 year of machine shop training. He has worked in numerous jobs in Film/video editor.  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 He quit all this about 10 years ago. About 7 years ago he became increasingly angry and moody and irritable. Some of it started when he and his wife are building a house and he felt like the contractor "screwed me out of $100,000.". He began seeing Dr. Candis Schatz in [place] and was diagnosed as being bipolar because he had significant mood swings anger and irritability. Since that he's been on a combination of Lamictal and Depakote which he thinks has helped. Because of his long-term difficulties with focus and concentration he was started on Vyvanse which he also thinks has helped.  The patient still has some difficulties with irritability but not like he used to. He and his wife don't get along but he admits that he was having an affair with an old girlfriend for the last few years and still talks to this woman periodically. His wife found out about it and is quite resentful. He also has significant memory problems and often loses his train of thought. He denies any head injuries.  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 He has numerous medical problems and has chronic pain from arthritis. Last year he also had knee surgery and is well as a small bowel obstruction.  The patient no longer uses drugs or alcohol. He is active in church and does a lot of things with his cousin. His energy is pretty good and he states he sleeps fairly well. He has never had psychiatric hospitalization or treatment for drug or alcohol abuse  The patient returns after 3 months.  He states he is doing fairly well.  He has no longer drank any alcohol and so far is not had any further altercations with his wife.  He does admit that he drinks quite a few energy drinks particularly on the weekends.  His blood pressure is high again today and I urged him to stop this.  He also eats salty sunflower nuts as well as pork.  He is going to see his primary doctor next week and I urged him to discuss all this.  He states that his mood has been good and he has no thoughts of hurting self or others and the Vyvanse is helping him stay focused.  Visit Diagnosis:    ICD-10-CM   1. Attention deficit hyperactivity disorder (ADHD), predominantly inattentive type F90.0   2. Bipolar 1 disorder, mixed, moderate (HCC) F31.62     Past Psychiatric History: Past outpatient treatment for bipolar disorder  Past Medical History:  Past Medical History:  Diagnosis Date  . Acid reflux   . 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 Arthritis   . Asthma    ACTIVITY INDUCED  . Bipolar 1 disorder ([place])   . BPH (benign prostatic hyperplasia)   . Cellulitis 05/2014   right side face  . Cerebral aneurysm    has a stent  . GERD (gastroesophageal reflux disease)   . Neck pain, chronic   . Sinus drainage   . 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  . Venous insufficiency     Past Surgical History:  Procedure Laterality Date  . cerebral  stent    . COLONOSCOPY N/A 01/02/2017   Procedure: COLONOSCOPY;  Surgeon: Rogene Houston, MD;  [facility];  Service: Endoscopy;  Laterality: N/A;  . dental implant  09/26/2014   left lower dental implant  . ESOPHAGOGASTRODUODENOSCOPY N/A 01/02/2017   Procedure: ESOPHAGOGASTRODUODENOSCOPY (EGD);  Surgeon: Rogene Houston, MD;  [facility];  Service: Endoscopy;  Laterality: N/A;  910  . EYE SURGERY Left Jan. 16, 2017   Cataract  . IR ANGIO INTRA EXTRACRAN SEL INTERNAL CAROTID BILAT MOD SED  01/20/2017  . IR ANGIO VERTEBRAL SEL VERTEBRAL UNI L MOD SED  01/20/2017  . JOINT REPLACEMENT Right    Knee  . JOINT REPLACEMENT Left    Hip  . NOSE SURGERY    . RADIOLOGY WITH ANESTHESIA N/A 12/29/2014   Procedure: Embolization;  Surgeon: Consuella Lose, MD;  Location: [facility];  Service: Radiology;  Laterality: N/A;  . SUBCLAVIAN ANGIOGRAM  Nov. 8, 2016  . TOTAL HIP ARTHROPLASTY Left 01/03/2013   Procedure: TOTAL HIP ARTHROPLASTY ANTERIOR APPROACH;  Surgeon: Gearlean Alf, MD;  Location: [facility];  Service: Orthopedics;  Laterality: Left;  . TOTAL KNEE ARTHROPLASTY Right 10/06/2014   Procedure: RIGHT TOTAL KNEE ARTHROPLASTY;  Surgeon: Gearlean Alf, MD;  Location: [facility];  Service: Orthopedics;  Laterality: Right;    Family Psychiatric History: See below  Family History:  Family History  Problem Relation Age of Onset  . Arrhythmia Mother        Has pacemaker  . Hypertension Mother   . Heart disease Mother   . Mental illness Mother   . Varicose Veins Mother   . Depression Mother   . Colon cancer Father   . Cancer Father        Prostate and Colon  . Diabetes Father   . Hypertension Father   . Cancer - Colon Father   . Depression Sister     Social History:  Social History   Socioeconomic History  . Marital status: Married    Spouse name: Not on file  . Number of children: Not on file  . Years of education: Not on file  . Highest education level: Not on file   Occupational History  . Not on file  Social Needs  . Financial resource strain: Not on file  . Food insecurity:    Worry: Not on file    Inability: Not on file  . Transportation needs:    Medical: Not on file    Non-medical: Not on file  Tobacco Use  . Smoking status: Former Smoker    Packs/day: 0.50    Types: Cigarettes    Start date: 05/23/2015    Last attempt to quit: 10/09/2017    Years  since quitting: 0.2  . Smokeless tobacco: Former Systems developer  . Tobacco comment: Stated "I smoke off and on"  Substance and Sexual Activity  . Alcohol use: No    Alcohol/week: 0.0 oz    Comment: 10-12-15 per pt no  . Drug use: No    Comment: 10-12-15 per tp no  . Sexual activity: Yes    Partners: Female  Lifestyle  . Physical activity:    Days per week: Not on file    Minutes per session: Not on file  . Stress: Not on file  Relationships  . Social connections:    Talks on phone: Not on file    Gets together: Not on file    Attends religious service: Not on file    Active member of club or organization: Not on file    Attends meetings of clubs or organizations: Not on file    Relationship status: Not on file  Other Topics Concern  . Not on file  Social History Narrative  . Not on file    Allergies:  Allergies  Allergen Reactions  . Erythromycin     Caused a "twisted stomach"  . Penicillins Rash    Metabolic Disorder Labs: No results found for: HGBA1C, MPG No results found for: PROLACTIN No results found for: CHOL, TRIG, HDL, CHOLHDL, VLDL, LDLCALC No results found for: TSH  Therapeutic Level Labs: No results found for: LITHIUM No results found for: VALPROATE No components found for:  CBMZ  Current Medications: Current Outpatient Medications  Medication Sig Dispense Refill  . aspirin 325 MG tablet Take 325 mg by mouth daily.    . cyclobenzaprine (FLEXERIL) 5 MG tablet     . dexlansoprazole (DEXILANT) 60 MG capsule Take 60 mg by mouth every morning.     .  Dutasteride-Tamsulosin HCl (JALYN) 0.5-0.4 MG CAPS Take 1 capsule by mouth daily at 12 noon.     . ferrous sulfate 325 (65 FE) MG tablet Take 325 mg by mouth daily with breakfast.    . HYDROcodone-acetaminophen (NORCO) 10-325 MG tablet Take 1 tablet by mouth every 6 (six) hours as needed for moderate pain.    Marland Kitchen ibuprofen (ADVIL,MOTRIN) 200 MG tablet Take 800 mg by mouth every 8 (eight) hours as needed for mild pain or moderate pain.    Marland Kitchen lamoTRIgine (LAMICTAL) 200 MG tablet Take 1 tablet (200 mg total) by mouth at bedtime. 30 tablet 2  . levalbuterol (XOPENEX HFA) 45 MCG/ACT inhaler Inhale 2 puffs into the lungs every 6 (six) hours as needed for wheezing.    Marland Kitchen levocetirizine (XYZAL) 5 MG tablet Take 1 tablet by mouth at bedtime.    Marland Kitchen lisdexamfetamine (VYVANSE) 70 MG capsule Take 1 capsule (70 mg total) by mouth daily. 30 capsule 0  . montelukast (SINGULAIR) 10 MG tablet Take 10 mg by mouth every morning.     . Multiple Vitamins-Minerals (MULTIVITAMIN WITH MINERALS) tablet Take 1 tablet by mouth daily.    . Pediatric Multivitamins-Iron (FLINTSTONES PLUS IRON) chewable tablet Chew 1 tablet by mouth 2 (two) times daily.    Marland Kitchen PROAIR HFA 108 (90 Base) MCG/ACT inhaler     . promethazine (PHENERGAN) 25 MG tablet Take 25 mg by mouth every 6 (six) hours as needed for nausea or vomiting.    . tadalafil (CIALIS) 20 MG tablet Take 20 mg by mouth daily as needed for erectile dysfunction.    . terbinafine (LAMISIL) 250 MG tablet Take 1 tablet (250 mg total) by mouth daily. [place]  tablet 0  . testosterone cypionate (DEPOTESTOTERONE CYPIONATE) 200 MG/ML injection Inject 200 mg into the muscle every 14 (fourteen) days.     Marland Kitchen ipratropium (ATROVENT) 0.03 % nasal spray Place 2 sprays into both nostrils every 12 (twelve) hours as needed for rhinitis. Reported on 10/23/2015    . lisdexamfetamine (VYVANSE) 70 MG capsule Take 1 capsule (70 mg total) by mouth daily. 30 capsule 0  . lisdexamfetamine (VYVANSE) 70 MG capsule Take  1 capsule (70 mg total) by mouth daily. 30 capsule 0  . losartan-hydrochlorothiazide (HYZAAR) 100-25 MG tablet     . predniSONE (DELTASONE) 10 MG tablet     . ranitidine (ZANTAC) 150 MG capsule Take 150 mg by mouth daily as needed for heartburn.     No current facility-administered medications for this visit.      Musculoskeletal: Strength & Muscle Tone: within normal limits Gait & Station: normal Patient leans: N/A  Psychiatric Specialty Exam: Review of Systems  Musculoskeletal: Positive for joint pain.  All other systems reviewed and are negative.   Blood pressure (!) 166/96, pulse 90, height 5\' 10"  (1.778 m), weight 218 lb (98.9 kg).Body mass index is 31.28 kg/m.  General Appearance: Casual, Neat and Well Groomed  Eye Contact:  Good  Speech:  Clear and Coherent  Volume:  Normal  Mood:  Euthymic  Affect:  Congruent  Thought Process:  Goal Directed  Orientation:  Full (Time, Place, and Person)  Thought Content: WDL   Suicidal Thoughts:  No  Homicidal Thoughts:  No  Memory:  Immediate;   Good Recent;   Good Remote;   Fair  Judgement:  Fair  Insight:  Lacking  Psychomotor Activity:  Normal  Concentration:  Concentration: Good and Attention Span: Good  Recall:  Good  Fund of Knowledge: Good  Language: Good  Akathisia:  No  Handed:  Right  AIMS (if indicated): not done  Assets:  Communication Skills Desire for Improvement Resilience Social Support Talents/Skills  ADL's:  Intact  Cognition: WNL  Sleep:  Good   Screenings:   Assessment and Plan: This patient is a 62 year old male with a history of ADHD and bipolar disorder.  He seems to be doing fairly well in terms of mood and focus.  However his blood pressure remains high.  He has been urged to stop the energy drinks and cut down on salt and consult his family doctor.  He will continue Vyvanse 70 mg daily for ADHD and Lamictal 200 mg daily for mood stabilization.  He will return to see me in 3  months   [name], MD 01/11/2018, 9:53 AM

## 2018-01-19 DIAGNOSIS — Z6831 Body mass index (BMI) 31.0-31.9, adult: Secondary | ICD-10-CM | POA: Diagnosis not present

## 2018-01-19 DIAGNOSIS — G894 Chronic pain syndrome: Secondary | ICD-10-CM | POA: Diagnosis not present

## 2018-01-19 DIAGNOSIS — J449 Chronic obstructive pulmonary disease, unspecified: Secondary | ICD-10-CM | POA: Diagnosis not present

## 2018-01-19 DIAGNOSIS — I720 Aneurysm of carotid artery: Secondary | ICD-10-CM | POA: Diagnosis not present

## 2018-01-19 DIAGNOSIS — Z96659 Presence of unspecified artificial knee joint: Secondary | ICD-10-CM | POA: Diagnosis not present

## 2018-01-19 DIAGNOSIS — F319 Bipolar disorder, unspecified: Secondary | ICD-10-CM | POA: Diagnosis not present

## 2018-01-19 DIAGNOSIS — M25569 Pain in unspecified knee: Secondary | ICD-10-CM | POA: Diagnosis not present

## 2018-01-19 DIAGNOSIS — E291 Testicular hypofunction: Secondary | ICD-10-CM | POA: Diagnosis not present

## 2018-01-19 DIAGNOSIS — M25519 Pain in unspecified shoulder: Secondary | ICD-10-CM | POA: Diagnosis not present

## 2018-01-19 DIAGNOSIS — I10 Essential (primary) hypertension: Secondary | ICD-10-CM | POA: Diagnosis not present

## 2018-01-27 ENCOUNTER — Ambulatory Visit: Payer: PPO

## 2018-01-27 DIAGNOSIS — B351 Tinea unguium: Secondary | ICD-10-CM

## 2018-01-27 DIAGNOSIS — L603 Nail dystrophy: Secondary | ICD-10-CM

## 2018-02-08 DIAGNOSIS — R252 Cramp and spasm: Secondary | ICD-10-CM | POA: Diagnosis not present

## 2018-02-08 DIAGNOSIS — M25562 Pain in left knee: Secondary | ICD-10-CM | POA: Diagnosis not present

## 2018-02-08 DIAGNOSIS — J019 Acute sinusitis, unspecified: Secondary | ICD-10-CM | POA: Diagnosis not present

## 2018-02-08 DIAGNOSIS — Z6831 Body mass index (BMI) 31.0-31.9, adult: Secondary | ICD-10-CM | POA: Diagnosis not present

## 2018-02-08 DIAGNOSIS — K149 Disease of tongue, unspecified: Secondary | ICD-10-CM | POA: Diagnosis not present

## 2018-02-08 DIAGNOSIS — J449 Chronic obstructive pulmonary disease, unspecified: Secondary | ICD-10-CM | POA: Diagnosis not present

## 2018-02-08 DIAGNOSIS — I10 Essential (primary) hypertension: Secondary | ICD-10-CM | POA: Diagnosis not present

## 2018-02-08 DIAGNOSIS — R140 Abdominal distension (gaseous): Secondary | ICD-10-CM | POA: Diagnosis not present

## 2018-02-08 DIAGNOSIS — K140 Glossitis: Secondary | ICD-10-CM | POA: Diagnosis not present

## 2018-02-08 DIAGNOSIS — Z6830 Body mass index (BMI) 30.0-30.9, adult: Secondary | ICD-10-CM | POA: Diagnosis not present

## 2018-02-08 DIAGNOSIS — R030 Elevated blood-pressure reading, without diagnosis of hypertension: Secondary | ICD-10-CM | POA: Diagnosis not present

## 2018-02-08 DIAGNOSIS — J069 Acute upper respiratory infection, unspecified: Secondary | ICD-10-CM | POA: Diagnosis not present

## 2018-02-09 NOTE — Progress Notes (Signed)
Pt presents with mycotic infection of nails 1-5 bilateral  All other systems are negative  Laser therapy administered to affected nails and tolerated well. All safety precautions were in place. Re-appointed in 4 weeks for 4th treatment 

## 2018-02-26 ENCOUNTER — Telehealth: Payer: Self-pay | Admitting: *Deleted

## 2018-02-26 NOTE — Telephone Encounter (Signed)
Requesting refill for terbinafine-90 day treatment is complete-f/u appt needed-request sent to pharmacy for denial

## 2018-03-02 ENCOUNTER — Ambulatory Visit: Payer: PPO

## 2018-03-02 DIAGNOSIS — R140 Abdominal distension (gaseous): Secondary | ICD-10-CM | POA: Diagnosis not present

## 2018-03-02 DIAGNOSIS — J069 Acute upper respiratory infection, unspecified: Secondary | ICD-10-CM | POA: Diagnosis not present

## 2018-03-02 DIAGNOSIS — L603 Nail dystrophy: Secondary | ICD-10-CM

## 2018-03-02 DIAGNOSIS — J449 Chronic obstructive pulmonary disease, unspecified: Secondary | ICD-10-CM | POA: Diagnosis not present

## 2018-03-02 DIAGNOSIS — Z6831 Body mass index (BMI) 31.0-31.9, adult: Secondary | ICD-10-CM | POA: Diagnosis not present

## 2018-03-02 DIAGNOSIS — K140 Glossitis: Secondary | ICD-10-CM | POA: Diagnosis not present

## 2018-03-02 DIAGNOSIS — R252 Cramp and spasm: Secondary | ICD-10-CM | POA: Diagnosis not present

## 2018-03-02 DIAGNOSIS — I10 Essential (primary) hypertension: Secondary | ICD-10-CM | POA: Diagnosis not present

## 2018-03-02 DIAGNOSIS — K149 Disease of tongue, unspecified: Secondary | ICD-10-CM | POA: Diagnosis not present

## 2018-03-02 DIAGNOSIS — M25562 Pain in left knee: Secondary | ICD-10-CM | POA: Diagnosis not present

## 2018-03-02 DIAGNOSIS — J019 Acute sinusitis, unspecified: Secondary | ICD-10-CM | POA: Diagnosis not present

## 2018-03-02 DIAGNOSIS — Z6830 Body mass index (BMI) 30.0-30.9, adult: Secondary | ICD-10-CM | POA: Diagnosis not present

## 2018-03-02 DIAGNOSIS — R030 Elevated blood-pressure reading, without diagnosis of hypertension: Secondary | ICD-10-CM | POA: Diagnosis not present

## 2018-03-04 NOTE — Progress Notes (Signed)
LAser procedure not performed due to malfunction, will call patient back to reschedule

## 2018-03-24 DIAGNOSIS — R252 Cramp and spasm: Secondary | ICD-10-CM | POA: Diagnosis not present

## 2018-03-24 DIAGNOSIS — K149 Disease of tongue, unspecified: Secondary | ICD-10-CM | POA: Diagnosis not present

## 2018-03-24 DIAGNOSIS — J069 Acute upper respiratory infection, unspecified: Secondary | ICD-10-CM | POA: Diagnosis not present

## 2018-03-24 DIAGNOSIS — Z6830 Body mass index (BMI) 30.0-30.9, adult: Secondary | ICD-10-CM | POA: Diagnosis not present

## 2018-03-24 DIAGNOSIS — J449 Chronic obstructive pulmonary disease, unspecified: Secondary | ICD-10-CM | POA: Diagnosis not present

## 2018-03-24 DIAGNOSIS — K140 Glossitis: Secondary | ICD-10-CM | POA: Diagnosis not present

## 2018-03-24 DIAGNOSIS — J019 Acute sinusitis, unspecified: Secondary | ICD-10-CM | POA: Diagnosis not present

## 2018-03-24 DIAGNOSIS — R140 Abdominal distension (gaseous): Secondary | ICD-10-CM | POA: Diagnosis not present

## 2018-03-24 DIAGNOSIS — R030 Elevated blood-pressure reading, without diagnosis of hypertension: Secondary | ICD-10-CM | POA: Diagnosis not present

## 2018-03-24 DIAGNOSIS — I10 Essential (primary) hypertension: Secondary | ICD-10-CM | POA: Diagnosis not present

## 2018-03-24 DIAGNOSIS — Z6831 Body mass index (BMI) 31.0-31.9, adult: Secondary | ICD-10-CM | POA: Diagnosis not present

## 2018-03-24 DIAGNOSIS — M25562 Pain in left knee: Secondary | ICD-10-CM | POA: Diagnosis not present

## 2018-03-29 DIAGNOSIS — K219 Gastro-esophageal reflux disease without esophagitis: Secondary | ICD-10-CM | POA: Diagnosis not present

## 2018-03-29 DIAGNOSIS — R030 Elevated blood-pressure reading, without diagnosis of hypertension: Secondary | ICD-10-CM | POA: Diagnosis not present

## 2018-03-29 DIAGNOSIS — I10 Essential (primary) hypertension: Secondary | ICD-10-CM | POA: Diagnosis not present

## 2018-03-29 DIAGNOSIS — R41840 Attention and concentration deficit: Secondary | ICD-10-CM | POA: Diagnosis not present

## 2018-03-29 DIAGNOSIS — E782 Mixed hyperlipidemia: Secondary | ICD-10-CM | POA: Diagnosis not present

## 2018-03-29 DIAGNOSIS — E875 Hyperkalemia: Secondary | ICD-10-CM | POA: Diagnosis not present

## 2018-03-29 DIAGNOSIS — I720 Aneurysm of carotid artery: Secondary | ICD-10-CM | POA: Diagnosis not present

## 2018-03-29 DIAGNOSIS — J449 Chronic obstructive pulmonary disease, unspecified: Secondary | ICD-10-CM | POA: Diagnosis not present

## 2018-03-29 DIAGNOSIS — G629 Polyneuropathy, unspecified: Secondary | ICD-10-CM | POA: Diagnosis not present

## 2018-03-29 DIAGNOSIS — F319 Bipolar disorder, unspecified: Secondary | ICD-10-CM | POA: Diagnosis not present

## 2018-04-13 ENCOUNTER — Encounter (HOSPITAL_COMMUNITY): Payer: Self-pay | Admitting: Psychiatry

## 2018-04-13 ENCOUNTER — Ambulatory Visit (HOSPITAL_COMMUNITY): Payer: PPO | Admitting: Psychiatry

## 2018-04-13 VITALS — BP 147/93 | HR 100 | Ht 70.0 in | Wt 216.0 lb

## 2018-04-13 DIAGNOSIS — F900 Attention-deficit hyperactivity disorder, predominantly inattentive type: Secondary | ICD-10-CM | POA: Diagnosis not present

## 2018-04-13 DIAGNOSIS — F3162 Bipolar disorder, current episode mixed, moderate: Secondary | ICD-10-CM | POA: Diagnosis not present

## 2018-04-13 MED ORDER — LISDEXAMFETAMINE DIMESYLATE 70 MG PO CAPS: 70.0000 mg | ORAL_CAPSULE | Freq: Every day | ORAL | 0 refills | Status: DC

## 2018-04-13 MED ORDER — LAMOTRIGINE 200 MG PO TABS: 200.0000 mg | ORAL_TABLET | Freq: Every day | ORAL | 2 refills | Status: DC

## 2018-04-13 NOTE — Progress Notes (Signed)
[facility] MD/PA/NP OP Progress Note  04/13/2018 10:44 AM Philip Gates  MRN:  [id number]  Chief Complaint:  Chief Complaint    Anxiety; ADHD; Follow-up     HPI: This patient is a 62 year old married white male lives with his wife in [place]. 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  The patient was referred by his primary physician, Dr. Wende Neighbors, for further assessment and treatment of bipolar disorder and ADHD.  The patient is a vague historian and he tends to ramble a lot. Apparently as a child he was very unfocused and hyperactive. He is always had difficulty completing tasks. He was never diagnosed with ADHD in childhood and he was able to complete high school and 1 year of machine shop training. He has worked in numerous jobs in Film/video editor.  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 He quit all this about 10 years ago. About 7 years ago he became increasingly angry and moody and irritable. Some of it started when he and his wife are building a house and he felt like the contractor "screwed me out of $100,000.". He began seeing Dr. Candis Schatz in [place] and was diagnosed as being bipolar because he had significant mood swings anger and irritability. Since that he's been on a combination of Lamictal and Depakote which he thinks has helped. Because of his long-term difficulties with focus and concentration he was started on Vyvanse which he also thinks has helped.  The patient still has some difficulties with irritability but not like he used to. He and his wife don't get along but he admits that he was having an affair with an old girlfriend for the last few years and still talks to this woman periodically. His wife found out about it and is quite resentful. He also has significant memory problems and often loses his train of thought. He denies any head  injuries. 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 He has numerous medical problems and has chronic pain from arthritis. Last year he also had knee surgery and is well as a small bowel obstruction.  The patient no longer uses drugs or alcohol. He is active in church and does a lot of things with his cousin. His energy is pretty good and he states he sleeps fairly well. He has never had psychiatric hospitalization or treatment for drug or alcohol abuse  The patient returns after 3 months.  He brings in a letter from the sheriff of [place] regarding his concealed handgun permit.  They are asking his mental health providers stating that he does not have a medical infirmity to prevent him from handling a firearm.  Given that on 10/14/2017 his wife had called and stated that he had been drinking violent and threatening I told him that I do not think I can medically are legally provide this statement.  However I offered to speak to [facility] health attorneys before make a final decision.  He was not too happy about my response but was not angry or out of control.  He did feel like he is being "singled out".  He denies any current use of alcohol or drugs and states that he and his wife are getting along well.  His blood pressure is still high.     Visit Diagnosis:    ICD-10-CM  1. Attention deficit hyperactivity disorder (ADHD), predominantly inattentive type F90.0   2. Bipolar 1 disorder, mixed, moderate (HCC) F31.62     Past Psychiatric History: Past outpatient treatment for bipolar disorder  Past Medical History:  Past Medical History:  Diagnosis Date  . Acid reflux   . 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 Arthritis   . Asthma    ACTIVITY INDUCED  . Bipolar 1 disorder ([place])   . BPH (benign prostatic hyperplasia)   . Cellulitis 05/2014   right side face  . Cerebral aneurysm    has a stent  . GERD (gastroesophageal reflux  disease)   . Neck pain, chronic   . Sinus drainage   . 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  . Venous insufficiency     Past Surgical History:  Procedure Laterality Date  . cerebral stent    . COLONOSCOPY N/A 01/02/2017   Procedure: COLONOSCOPY;  Surgeon: Rogene Houston, MD;  [facility];  Service: Endoscopy;  Laterality: N/A;  . dental implant  09/26/2014   left lower dental implant  . ESOPHAGOGASTRODUODENOSCOPY N/A 01/02/2017   Procedure: ESOPHAGOGASTRODUODENOSCOPY (EGD);  Surgeon: Rogene Houston, MD;  [facility];  Service: Endoscopy;  Laterality: N/A;  910  . EYE SURGERY Left Jan. 16, 2017   Cataract  . IR ANGIO INTRA EXTRACRAN SEL INTERNAL CAROTID BILAT MOD SED  01/20/2017  . IR ANGIO VERTEBRAL SEL VERTEBRAL UNI L MOD SED  01/20/2017  . JOINT REPLACEMENT Right    Knee  . JOINT REPLACEMENT Left    Hip  . NOSE SURGERY    . RADIOLOGY WITH ANESTHESIA N/A 12/29/2014   Procedure: Embolization;  Surgeon: Consuella Lose, MD;  Location: [facility];  Service: Radiology;  Laterality: N/A;  . SUBCLAVIAN ANGIOGRAM  Nov. 8, 2016  . TOTAL HIP ARTHROPLASTY Left 01/03/2013   Procedure: TOTAL HIP ARTHROPLASTY ANTERIOR APPROACH;  Surgeon: Gearlean Alf, MD;  Location: [facility];  Service: Orthopedics;  Laterality: Left;  . TOTAL KNEE ARTHROPLASTY Right 10/06/2014   Procedure: RIGHT TOTAL KNEE ARTHROPLASTY;  Surgeon: Gearlean Alf, MD;  Location: [facility];  Service: Orthopedics;  Laterality: Right;    Family Psychiatric History: See below  Family History:  Family History  Problem Relation Age of Onset  . Arrhythmia Mother        Has pacemaker  . Hypertension Mother   . Heart disease Mother   . Mental illness Mother   . Varicose Veins Mother   . Depression Mother   . Colon cancer Father   . Cancer Father        Prostate and Colon  . Diabetes Father   . Hypertension Father   . Cancer - Colon Father   . Depression Sister      Social History:  Social History   Socioeconomic History  . Marital status: Married    Spouse name: Not on file  . Number of children: Not on file  . Years of education: Not on file  . Highest education level: Not on file  Occupational History  . Not on file  Social Needs  . Financial resource strain: Not on file  . Food insecurity:    Worry: Not on file    Inability: Not on file  . Transportation needs:    Medical: Not on file    Non-medical: Not on file  Tobacco Use  .  Smoking status: Former Smoker    Packs/day: 0.50    Types: Cigarettes    Start date: 05/23/2015    Last attempt to quit: 10/09/2017    Years since quitting: 0.5  . Smokeless tobacco: Former Systems developer  . Tobacco comment: Stated "I smoke off and on"  Substance and Sexual Activity  . Alcohol use: No    Alcohol/week: 0.0 oz    Comment: 10-12-15 per pt no  . Drug use: No    Comment: 10-12-15 per tp no  . Sexual activity: Yes    Partners: Female  Lifestyle  . Physical activity:    Days per week: Not on file    Minutes per session: Not on file  . Stress: Not on file  Relationships  . Social connections:    Talks on phone: Not on file    Gets together: Not on file    Attends religious service: Not on file    Active member of club or organization: Not on file    Attends meetings of clubs or organizations: Not on file    Relationship status: Not on file  Other Topics Concern  . Not on file  Social History Narrative  . Not on file    Allergies:  Allergies  Allergen Reactions  . Erythromycin     Caused a "twisted stomach"  . Penicillins Rash    Metabolic Disorder Labs: No results found for: HGBA1C, MPG No results found for: PROLACTIN No results found for: CHOL, TRIG, HDL, CHOLHDL, VLDL, LDLCALC No results found for: TSH  Therapeutic Level Labs: No results found for: LITHIUM No results found for: VALPROATE No components found for:  CBMZ  Current Medications: Current Outpatient Medications   Medication Sig Dispense Refill  . aspirin 325 MG tablet Take 325 mg by mouth daily.    . cyclobenzaprine (FLEXERIL) 5 MG tablet     . dexlansoprazole (DEXILANT) 60 MG capsule Take 60 mg by mouth every morning.     . Dutasteride-Tamsulosin HCl (JALYN) 0.5-0.4 MG CAPS Take 1 capsule by mouth daily at 12 noon.     . ferrous sulfate 325 (65 FE) MG tablet Take 325 mg by mouth daily with breakfast.    . HYDROcodone-acetaminophen (NORCO) 10-325 MG tablet Take 1 tablet by mouth every 6 (six) hours as needed for moderate pain.    Marland Kitchen ibuprofen (ADVIL,MOTRIN) 200 MG tablet Take 800 mg by mouth every 8 (eight) hours as needed for mild pain or moderate pain.    Marland Kitchen ipratropium (ATROVENT) 0.03 % nasal spray Place 2 sprays into both nostrils every 12 (twelve) hours as needed for rhinitis. Reported on 10/23/2015    . lamoTRIgine (LAMICTAL) 200 MG tablet Take 1 tablet (200 mg total) by mouth at bedtime. 30 tablet 2  . levalbuterol (XOPENEX HFA) 45 MCG/ACT inhaler Inhale 2 puffs into the lungs every 6 (six) hours as needed for wheezing.    Marland Kitchen levocetirizine (XYZAL) 5 MG tablet Take 1 tablet by mouth at bedtime.    Marland Kitchen lisdexamfetamine (VYVANSE) 70 MG capsule Take 1 capsule (70 mg total) by mouth daily. 30 capsule 0  . lisdexamfetamine (VYVANSE) 70 MG capsule Take 1 capsule (70 mg total) by mouth daily. 30 capsule 0  . lisdexamfetamine (VYVANSE) 70 MG capsule Take 1 capsule (70 mg total) by mouth daily. 30 capsule 0  . losartan-hydrochlorothiazide (HYZAAR) 100-25 MG tablet     . montelukast (SINGULAIR) 10 MG tablet Take 10 mg by mouth every morning.     Marland Kitchen  Multiple Vitamins-Minerals (MULTIVITAMIN WITH MINERALS) tablet Take 1 tablet by mouth daily.    . Pediatric Multivitamins-Iron (FLINTSTONES PLUS IRON) chewable tablet Chew 1 tablet by mouth 2 (two) times daily.    . predniSONE (DELTASONE) 10 MG tablet     . PROAIR HFA 108 (90 Base) MCG/ACT inhaler     . promethazine (PHENERGAN) 25 MG tablet Take 25 mg by mouth every 6  (six) hours as needed for nausea or vomiting.    . ranitidine (ZANTAC) 150 MG capsule Take 150 mg by mouth daily as needed for heartburn.    . tadalafil (CIALIS) 20 MG tablet Take 20 mg by mouth daily as needed for erectile dysfunction.    . terbinafine (LAMISIL) 250 MG tablet Take 1 tablet (250 mg total) by mouth daily. 90 tablet 0  . testosterone cypionate (DEPOTESTOTERONE CYPIONATE) 200 MG/ML injection Inject 200 mg into the muscle every 14 (fourteen) days.      No current facility-administered medications for this visit.      Musculoskeletal: Strength & Muscle Tone: within normal limits Gait & Station: normal Patient leans: N/A  Psychiatric Specialty Exam: Review of Systems  Musculoskeletal: Positive for joint pain.  All other systems reviewed and are negative.   Blood pressure (!) 147/93, pulse 100, height 5\' 10"  (1.778 m), weight 216 lb (98 kg), SpO2 96 %.Body mass index is 30.99 kg/m.  General Appearance: Casual, Neat and Well Groomed  Eye Contact:  Good  Speech:  Clear and Coherent  Volume:  Normal  Mood:  Irritable  Affect:  Constricted  Thought Process:  Goal Directed  Orientation:  Full (Time, Place, and Person)  Thought Content: Rumination   Suicidal Thoughts:  No  Homicidal Thoughts:  No  Memory:  Immediate;   Good Recent;   Good Remote;   Fair  Judgement:  Fair  Insight:  Lacking  Psychomotor Activity:  Normal  Concentration:  Concentration: Good and Attention Span: Good  Recall:  Good  Fund of Knowledge: Good  Language: Good  Akathisia:  No  Handed:  Right  AIMS (if indicated): not done  Assets:  Communication Skills Desire for Improvement Physical Health Resilience Social Support Talents/Skills  ADL's:  Intact  Cognition: WNL  Sleep:  Good   Screenings:   Assessment and Plan: Patient is a 33-year-old male with a history of bipolar disorder and ADHD as well as alcohol abuse.  He claims he is not currently drinking and he does not appear  intoxicated.  Nevertheless given his history of violent behavior towards his wife I do not think it is prudent to support his concealed handgun permit application.  Nevertheless I will consult [facility] management and our legal counsel before we make a decision.  For now he will continue Vyvanse 70 mg daily for ADHD as well as Lamictal 200 mg daily at bedtime for mood stabilization.  He again has been asked to consult his family physician regarding his blood pressure.  He will return to see me in 3 months   [name], MD 04/13/2018, 10:44 AM

## 2018-05-11 DIAGNOSIS — R05 Cough: Secondary | ICD-10-CM | POA: Diagnosis not present

## 2018-05-11 DIAGNOSIS — J441 Chronic obstructive pulmonary disease with (acute) exacerbation: Secondary | ICD-10-CM | POA: Diagnosis not present

## 2018-05-11 DIAGNOSIS — Z6831 Body mass index (BMI) 31.0-31.9, adult: Secondary | ICD-10-CM | POA: Diagnosis not present

## 2018-05-21 DIAGNOSIS — R05 Cough: Secondary | ICD-10-CM | POA: Diagnosis not present

## 2018-05-21 DIAGNOSIS — H65196 Other acute nonsuppurative otitis media, recurrent, bilateral: Secondary | ICD-10-CM | POA: Diagnosis not present

## 2018-05-21 DIAGNOSIS — Z6831 Body mass index (BMI) 31.0-31.9, adult: Secondary | ICD-10-CM | POA: Diagnosis not present

## 2018-05-31 DIAGNOSIS — M25562 Pain in left knee: Secondary | ICD-10-CM | POA: Diagnosis not present

## 2018-05-31 DIAGNOSIS — R7301 Impaired fasting glucose: Secondary | ICD-10-CM | POA: Diagnosis not present

## 2018-05-31 DIAGNOSIS — R140 Abdominal distension (gaseous): Secondary | ICD-10-CM | POA: Diagnosis not present

## 2018-05-31 DIAGNOSIS — E782 Mixed hyperlipidemia: Secondary | ICD-10-CM | POA: Diagnosis not present

## 2018-05-31 DIAGNOSIS — R252 Cramp and spasm: Secondary | ICD-10-CM | POA: Diagnosis not present

## 2018-05-31 DIAGNOSIS — G8929 Other chronic pain: Secondary | ICD-10-CM | POA: Diagnosis not present

## 2018-05-31 DIAGNOSIS — I10 Essential (primary) hypertension: Secondary | ICD-10-CM | POA: Diagnosis not present

## 2018-05-31 DIAGNOSIS — R030 Elevated blood-pressure reading, without diagnosis of hypertension: Secondary | ICD-10-CM | POA: Diagnosis not present

## 2018-05-31 DIAGNOSIS — K149 Disease of tongue, unspecified: Secondary | ICD-10-CM | POA: Diagnosis not present

## 2018-05-31 DIAGNOSIS — K219 Gastro-esophageal reflux disease without esophagitis: Secondary | ICD-10-CM | POA: Diagnosis not present

## 2018-05-31 DIAGNOSIS — J441 Chronic obstructive pulmonary disease with (acute) exacerbation: Secondary | ICD-10-CM | POA: Diagnosis not present

## 2018-05-31 DIAGNOSIS — J069 Acute upper respiratory infection, unspecified: Secondary | ICD-10-CM | POA: Diagnosis not present

## 2018-05-31 DIAGNOSIS — J449 Chronic obstructive pulmonary disease, unspecified: Secondary | ICD-10-CM | POA: Diagnosis not present

## 2018-05-31 DIAGNOSIS — K140 Glossitis: Secondary | ICD-10-CM | POA: Diagnosis not present

## 2018-05-31 DIAGNOSIS — Z6831 Body mass index (BMI) 31.0-31.9, adult: Secondary | ICD-10-CM | POA: Diagnosis not present

## 2018-05-31 DIAGNOSIS — J019 Acute sinusitis, unspecified: Secondary | ICD-10-CM | POA: Diagnosis not present

## 2018-05-31 DIAGNOSIS — Z6830 Body mass index (BMI) 30.0-30.9, adult: Secondary | ICD-10-CM | POA: Diagnosis not present

## 2018-06-29 ENCOUNTER — Ambulatory Visit: Payer: PPO | Admitting: Podiatry

## 2018-06-29 DIAGNOSIS — Z85828 Personal history of other malignant neoplasm of skin: Secondary | ICD-10-CM | POA: Diagnosis not present

## 2018-06-29 DIAGNOSIS — L821 Other seborrheic keratosis: Secondary | ICD-10-CM | POA: Diagnosis not present

## 2018-06-29 DIAGNOSIS — D485 Neoplasm of uncertain behavior of skin: Secondary | ICD-10-CM | POA: Diagnosis not present

## 2018-06-29 DIAGNOSIS — L578 Other skin changes due to chronic exposure to nonionizing radiation: Secondary | ICD-10-CM | POA: Diagnosis not present

## 2018-06-29 DIAGNOSIS — L603 Nail dystrophy: Secondary | ICD-10-CM

## 2018-06-29 DIAGNOSIS — C44622 Squamous cell carcinoma of skin of right upper limb, including shoulder: Secondary | ICD-10-CM | POA: Diagnosis not present

## 2018-06-29 DIAGNOSIS — C44722 Squamous cell carcinoma of skin of right lower limb, including hip: Secondary | ICD-10-CM | POA: Diagnosis not present

## 2018-06-29 DIAGNOSIS — L570 Actinic keratosis: Secondary | ICD-10-CM | POA: Diagnosis not present

## 2018-07-02 NOTE — Progress Notes (Signed)
Pt presents with mycotic infection of nails 1-5 bilateral   All other systems are negative  Laser therapy administered to affected nails and tolerated well. All safety precautions were in place. Re-appointed in 4 weeks for 5th treatment 

## 2018-07-14 ENCOUNTER — Ambulatory Visit (HOSPITAL_COMMUNITY): Payer: PPO | Admitting: Psychiatry

## 2018-07-14 ENCOUNTER — Encounter (HOSPITAL_COMMUNITY): Payer: Self-pay | Admitting: Psychiatry

## 2018-07-14 VITALS — BP 131/87 | HR 113 | Ht 70.0 in | Wt 216.0 lb

## 2018-07-14 DIAGNOSIS — F3162 Bipolar disorder, current episode mixed, moderate: Secondary | ICD-10-CM | POA: Diagnosis not present

## 2018-07-14 DIAGNOSIS — F900 Attention-deficit hyperactivity disorder, predominantly inattentive type: Secondary | ICD-10-CM | POA: Diagnosis not present

## 2018-07-14 MED ORDER — LAMOTRIGINE 200 MG PO TABS: 200.0000 mg | ORAL_TABLET | Freq: Every day | ORAL | 2 refills | Status: DC

## 2018-07-14 MED ORDER — LISDEXAMFETAMINE DIMESYLATE 70 MG PO CAPS: 70.0000 mg | ORAL_CAPSULE | Freq: Every day | ORAL | 0 refills | Status: DC

## 2018-07-14 NOTE — Progress Notes (Signed)
[facility] MD/PA/NP OP Progress Note  07/14/2018 10:38 AM Philip Gates  MRN:  [id number]  Chief Complaint:  Chief Complaint    Manic Behavior; Depression; ADHD; Follow-up; Alcohol Problem     HPI: This patient is a62 year old married white male lives with his wife in [place]. He used to work as a Consulting civil engineer for a [employer] but has been retired for about one year on disability.  The patient was referred by his primary physician, Dr. Wende Neighbors, for further assessment and treatment of bipolar disorder and ADHD.  The patient is a vague historian and he tends to ramble a lot. Apparently as a child he was very unfocused and hyperactive. He is always had difficulty completing tasks. He was never diagnosed with ADHD in childhood and he was able to complete high school and 1 year of machine shop training. He has worked in numerous jobs in Film/video editor.  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 He quit all this about 10 years ago. About 7 years ago he became increasingly angry and moody and irritable. Some of it started when he and his wife are building a house and he felt like the contractor "screwed me out of $100,000.". He began seeing Dr. Candis Schatz in [place] and was diagnosed as being bipolar because he had significant mood swings anger and irritability. Since that he's been on a combination of Lamictal and Depakote which he thinks has helped. Because of his long-term difficulties with focus and concentration he was started on Vyvanse which he also thinks has helped.  The patient still has some difficulties with irritability but not like he used to. He and his wife don't get along but he admits that he was having an affair with an old girlfriend for the last few years and still talks to this woman periodically. His wife found out about it and is quite resentful. He also has significant memory problems and often loses his  train of thought. He denies any head injuries. 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 He has numerous medical problems and has chronic pain from arthritis. Last year he also had knee surgery and is well as a small bowel obstruction.  The patient no longer uses drugs or alcohol. He is active in church and does a lot of things with his cousin. His energy is pretty good and he states he sleeps fairly well. He has never had psychiatric hospitalization or treatment for drug or alcohol abuse  The patient returns after 3 months.  He states overall he is doing well.  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  He denies any use of drugs or alcohol.  He still upset that I would not okay his gun permit but I explained given the circumstances of the threats he had made in the past to his wife I could not legally do this.  I had also checked with [facility] risk management and they agreed that it would not be prudent to do so.  The patient denies any current issues with his wife.  He claims they are getting along well.  He denies any thoughts of harm to self or others or depressed mood.  He is focusing well with the Vyvanse. Visit Diagnosis:    ICD-10-CM   1. Attention deficit hyperactivity disorder (ADHD), predominantly inattentive type F90.0   2. Bipolar 1 disorder, mixed, moderate (HCC) F31.62  Past Psychiatric History: Past outpatient treatment for bipolar disorder  Past Medical History:  Past Medical History:  Diagnosis Date  . Acid reflux   . 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 Arthritis   . Asthma    ACTIVITY INDUCED  . Bipolar 1 disorder ([place])   . BPH (benign prostatic hyperplasia)   . Cellulitis 05/2014   right side face  . Cerebral aneurysm    has a stent  . GERD (gastroesophageal reflux disease)   . Neck pain, chronic   . Sinus drainage   . 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  . Venous insufficiency     Past Surgical History:  Procedure Laterality Date  . cerebral stent    . COLONOSCOPY N/A 01/02/2017   Procedure: COLONOSCOPY;  Surgeon: Rogene Houston, MD;  [facility];  Service: Endoscopy;  Laterality: N/A;  . dental implant  09/26/2014   left lower dental implant  . ESOPHAGOGASTRODUODENOSCOPY N/A 01/02/2017   Procedure: ESOPHAGOGASTRODUODENOSCOPY (EGD);  Surgeon: Rogene Houston, MD;  [facility];  Service: Endoscopy;  Laterality: N/A;  910  . EYE SURGERY Left Jan. 16, 2017   Cataract  . IR ANGIO INTRA EXTRACRAN SEL INTERNAL CAROTID BILAT MOD SED  01/20/2017  . IR ANGIO VERTEBRAL SEL VERTEBRAL UNI L MOD SED  01/20/2017  . JOINT REPLACEMENT Right    Knee  . JOINT REPLACEMENT Left    Hip  . NOSE SURGERY    . RADIOLOGY WITH ANESTHESIA N/A 12/29/2014   Procedure: Embolization;  Surgeon: Consuella Lose, MD;  Location: [facility];  Service: Radiology;  Laterality: N/A;  . SUBCLAVIAN ANGIOGRAM  Nov. 8, 2016  . TOTAL HIP ARTHROPLASTY Left 01/03/2013   Procedure: TOTAL HIP ARTHROPLASTY ANTERIOR APPROACH;  Surgeon: Gearlean Alf, MD;  Location: [facility];  Service: Orthopedics;  Laterality: Left;  . TOTAL KNEE ARTHROPLASTY Right 10/06/2014   Procedure: RIGHT TOTAL KNEE ARTHROPLASTY;  Surgeon: Gearlean Alf, MD;  Location: [facility];  Service: Orthopedics;  Laterality: Right;    Family Psychiatric History: See below  Family History:  Family History  Problem Relation Age of Onset  . Arrhythmia Mother        Has pacemaker  . Hypertension Mother   . Heart disease Mother   . Mental illness Mother   . Varicose Veins Mother   . Depression Mother   . Colon cancer Father   . Cancer Father        Prostate and Colon  . Diabetes Father   . Hypertension Father   . Cancer - Colon Father   . Depression Sister     Social History:  Social History   Socioeconomic History  . Marital status: Married    Spouse name: Not on file  .  Number of children: Not on file  . Years of education: Not on file  . Highest education level: Not on file  Occupational History  . Not on file  Social Needs  . Financial resource strain: Not on file  . Food insecurity:    Worry: Not on file    Inability: Not on file  . Transportation needs:    Medical: Not on file    Non-medical: Not on file  Tobacco Use  . Smoking status: Former Smoker    Packs/day: 0.50    Types: Cigarettes    Start date: 05/23/2015  Last attempt to quit: 10/09/2017    Years since quitting: 0.7  . Smokeless tobacco: Former Systems developer  . Tobacco comment: Stated "I smoke off and on"  Substance and Sexual Activity  . Alcohol use: No    Alcohol/week: 0.0 standard drinks    Comment: 10-12-15 per pt no  . Drug use: No    Comment: 10-12-15 per tp no  . Sexual activity: Yes    Partners: Female  Lifestyle  . Physical activity:    Days per week: Not on file    Minutes per session: Not on file  . Stress: Not on file  Relationships  . Social connections:    Talks on phone: Not on file    Gets together: Not on file    Attends religious service: Not on file    Active member of club or organization: Not on file    Attends meetings of clubs or organizations: Not on file    Relationship status: Not on file  Other Topics Concern  . Not on file  Social History Narrative  . Not on file    Allergies:  Allergies  Allergen Reactions  . Erythromycin     Caused a "twisted stomach"  . Penicillins Rash    Metabolic Disorder Labs: No results found for: HGBA1C, MPG No results found for: PROLACTIN No results found for: CHOL, TRIG, HDL, CHOLHDL, VLDL, LDLCALC No results found for: TSH  Therapeutic Level Labs: No results found for: LITHIUM No results found for: VALPROATE No components found for:  CBMZ  Current Medications: Current Outpatient Medications  Medication Sig Dispense Refill  . aspirin 325 MG tablet Take 325 mg by mouth daily.    . cyclobenzaprine (FLEXERIL)  5 MG tablet     . dexlansoprazole (DEXILANT) 60 MG capsule Take 60 mg by mouth every morning.     . Dutasteride-Tamsulosin HCl (JALYN) 0.5-0.4 MG CAPS Take 1 capsule by mouth daily at 12 noon.     . ferrous sulfate 325 (65 FE) MG tablet Take 325 mg by mouth daily with breakfast.    . HYDROcodone-acetaminophen (NORCO) 10-325 MG tablet Take 1 tablet by mouth every 6 (six) hours as needed for moderate pain.    Marland Kitchen ibuprofen (ADVIL,MOTRIN) 200 MG tablet Take 800 mg by mouth every 8 (eight) hours as needed for mild pain or moderate pain.    Marland Kitchen ipratropium (ATROVENT) 0.03 % nasal spray Place 2 sprays into both nostrils every 12 (twelve) hours as needed for rhinitis. Reported on 10/23/2015    . lamoTRIgine (LAMICTAL) 200 MG tablet Take 1 tablet (200 mg total) by mouth at bedtime. 30 tablet 2  . levalbuterol (XOPENEX HFA) 45 MCG/ACT inhaler Inhale 2 puffs into the lungs every 6 (six) hours as needed for wheezing.    Marland Kitchen levocetirizine (XYZAL) 5 MG tablet Take 1 tablet by mouth at bedtime.    Marland Kitchen lisdexamfetamine (VYVANSE) 70 MG capsule Take 1 capsule (70 mg total) by mouth daily. 30 capsule 0  . lisdexamfetamine (VYVANSE) 70 MG capsule Take 1 capsule (70 mg total) by mouth daily. 30 capsule 0  . lisdexamfetamine (VYVANSE) 70 MG capsule Take 1 capsule (70 mg total) by mouth daily. 30 capsule 0  . losartan-hydrochlorothiazide (HYZAAR) 100-25 MG tablet     . montelukast (SINGULAIR) 10 MG tablet Take 10 mg by mouth every morning.     . Multiple Vitamins-Minerals (MULTIVITAMIN WITH MINERALS) tablet Take 1 tablet by mouth daily.    . Pediatric Multivitamins-Iron (FLINTSTONES PLUS IRON) chewable  tablet Chew 1 tablet by mouth 2 (two) times daily.    . predniSONE (DELTASONE) 10 MG tablet     . PROAIR HFA 108 (90 Base) MCG/ACT inhaler     . promethazine (PHENERGAN) 25 MG tablet Take 25 mg by mouth every 6 (six) hours as needed for nausea or vomiting.    . ranitidine (ZANTAC) 150 MG capsule Take 150 mg by mouth daily as  needed for heartburn.    . tadalafil (CIALIS) 20 MG tablet Take 20 mg by mouth daily as needed for erectile dysfunction.    . terbinafine (LAMISIL) 250 MG tablet Take 1 tablet (250 mg total) by mouth daily. 90 tablet 0  . testosterone cypionate (DEPOTESTOTERONE CYPIONATE) 200 MG/ML injection Inject 200 mg into the muscle every 14 (fourteen) days.      No current facility-administered medications for this visit.      Musculoskeletal: Strength & Muscle Tone: within normal limits Gait & Station: normal Patient leans: N/A  Psychiatric Specialty Exam: Review of Systems  All other systems reviewed and are negative.   Blood pressure 131/87, pulse (!) 113, height 5\' 10"  (1.778 m), weight 216 lb (98 kg), SpO2 97 %.Body mass index is 30.99 kg/m.  General Appearance: Casual and Fairly Groomed  Eye Contact:  Good  Speech:  Clear and Coherent  Volume:  Normal  Mood:  Irritable  Affect:  Flat  Thought Process:  Goal Directed  Orientation:  Full (Time, Place, and Person)  Thought Content: Rumination   Suicidal Thoughts:  No  Homicidal Thoughts:  No  Memory:  Immediate;   Good Recent;   Good Remote;   Fair  Judgement:  Poor  Insight:  Shallow  Psychomotor Activity:  Normal  Concentration:  Concentration: Good and Attention Span: Good  Recall:  Good  Fund of Knowledge: Fair  Language: Good  Akathisia:  No  Handed:  Right  AIMS (if indicated): not done  Assets:  Communication Skills Desire for Improvement Physical Health Resilience Social Support Talents/Skills  ADL's:  Intact  Cognition: WNL  Sleep:  Good   Screenings:   Assessment and Plan: This patient is a 62 year old male with a history of alcohol abuse 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  He is a bit irritable today but did come around after I explained the reasons that I could not support him getting a concealed carry permit.  He will continue Vyvanse 70 mg daily for focus and Lamictal 200 mg  daily for mood swings.  He will return to see me in 3 months   [name], MD 07/14/2018, 10:38 AM

## 2018-07-19 DIAGNOSIS — M5442 Lumbago with sciatica, left side: Secondary | ICD-10-CM | POA: Diagnosis not present

## 2018-07-19 DIAGNOSIS — M5440 Lumbago with sciatica, unspecified side: Secondary | ICD-10-CM | POA: Insufficient documentation

## 2018-07-19 DIAGNOSIS — I10 Essential (primary) hypertension: Secondary | ICD-10-CM | POA: Diagnosis not present

## 2018-07-27 ENCOUNTER — Ambulatory Visit: Payer: Self-pay

## 2018-07-27 DIAGNOSIS — L603 Nail dystrophy: Secondary | ICD-10-CM

## 2018-07-27 DIAGNOSIS — B351 Tinea unguium: Secondary | ICD-10-CM

## 2018-07-28 NOTE — Progress Notes (Signed)
Pt presents with mycotic infection of nails 1-5 bilateral   All other systems are negative  Laser therapy administered to affected nails and tolerated well. All safety precautions were in place. Re-appointed in 4 weeks for 6th treatment

## 2018-08-20 DIAGNOSIS — M5442 Lumbago with sciatica, left side: Secondary | ICD-10-CM | POA: Diagnosis not present

## 2018-08-24 ENCOUNTER — Ambulatory Visit: Payer: Self-pay

## 2018-08-24 DIAGNOSIS — B351 Tinea unguium: Secondary | ICD-10-CM

## 2018-08-24 DIAGNOSIS — M5126 Other intervertebral disc displacement, lumbar region: Secondary | ICD-10-CM | POA: Diagnosis not present

## 2018-08-24 DIAGNOSIS — M47816 Spondylosis without myelopathy or radiculopathy, lumbar region: Secondary | ICD-10-CM | POA: Diagnosis not present

## 2018-08-24 DIAGNOSIS — M5442 Lumbago with sciatica, left side: Secondary | ICD-10-CM | POA: Diagnosis not present

## 2018-08-24 DIAGNOSIS — L603 Nail dystrophy: Secondary | ICD-10-CM

## 2018-08-24 DIAGNOSIS — M4807 Spinal stenosis, lumbosacral region: Secondary | ICD-10-CM | POA: Diagnosis not present

## 2018-08-25 DIAGNOSIS — I10 Essential (primary) hypertension: Secondary | ICD-10-CM | POA: Diagnosis not present

## 2018-08-25 DIAGNOSIS — Z6830 Body mass index (BMI) 30.0-30.9, adult: Secondary | ICD-10-CM | POA: Diagnosis not present

## 2018-08-25 DIAGNOSIS — M542 Cervicalgia: Secondary | ICD-10-CM | POA: Diagnosis not present

## 2018-08-25 NOTE — Progress Notes (Signed)
Pt presents with mycotic infection of nails 1-5 bilateral   All other systems are negative  Laser therapy administered to affected nails and tolerated well. All safety precautions were in place. Re-appointed in 4 weeks for 5th treatment

## 2018-08-27 ENCOUNTER — Encounter: Payer: Self-pay | Admitting: Podiatry

## 2018-08-27 ENCOUNTER — Ambulatory Visit: Payer: PPO | Admitting: Podiatry

## 2018-08-27 DIAGNOSIS — L600 Ingrowing nail: Secondary | ICD-10-CM

## 2018-08-27 MED ORDER — NEOMYCIN-POLYMYXIN-HC 3.5-10000-1 OT SOLN: OTIC | 0 refills | Status: DC

## 2018-08-27 NOTE — Patient Instructions (Signed)

## 2018-08-28 NOTE — Progress Notes (Signed)
Subjective:   Patient ID: Philip Gates, male   DOB: 62 y.o.   MRN: [id number]   HPI patient presents with chronic ingrown toenail of the right third toe medial border that is been very painful and he is tried to trim it himself and had previous laser therapy due to discoloration.  Patient states it is very sore and he has tried to soak it also without relief of his symptoms   Review of Systems  All other systems reviewed and are negative.       Objective:  Physical Exam Vitals signs and nursing note reviewed.  Constitutional:      Appearance: He is well-developed.  Pulmonary:     Effort: Pulmonary effort is normal.  Musculoskeletal: Normal range of motion.  Skin:    General: Skin is warm.  Neurological:     Mental Status: He is alert.     Neurovascular status intact muscle strength is adequate range of motion was found to be within normal limits with patient having generalized nail disease with thickness of the beds and has an incurvation of the medial border of the third digit right that is painful when pressed and make shoe gear difficult.  Patient's nail in general is not healthy but the medial side is the most painful he is experiencing with no redness or active drainage noted     Assessment:  Ingrown toenail deformity third right with also mycotic nail infection 1-5 bilateral which is contributory to the pain he experiences     Plan:  H&P and discussed condition at great length.  I do think we can try to just work on the corner but I did explain that there is no guarantee that this will solve his problem and that ultimately it may require removal of the entire nail.  At this point we can attempt to just fix the corner and hopefully this will relieve his pain and allow him still to have some nail growth.  I had him sign consent form explaining risk and today infiltrated the third digit 61 mg Xylocaine Marcaine mixture did sterile prep of the toe removed medial border exposed  matrix and applied phenol 3 applications 30 seconds followed by alcohol lavage and sterile dressing.  Gave instructions on soaks encouraged him to leave dressing on for 24 hours but to take it off earlier if any throbbing were to occur and wrote prescription for Corticosporin otic solution.  Will be rechecked and is encouraged to call with questions

## 2018-09-06 DIAGNOSIS — J440 Chronic obstructive pulmonary disease with acute lower respiratory infection: Secondary | ICD-10-CM | POA: Diagnosis not present

## 2018-09-06 DIAGNOSIS — Z6831 Body mass index (BMI) 31.0-31.9, adult: Secondary | ICD-10-CM | POA: Diagnosis not present

## 2018-09-06 DIAGNOSIS — E669 Obesity, unspecified: Secondary | ICD-10-CM | POA: Diagnosis not present

## 2018-09-06 DIAGNOSIS — F17219 Nicotine dependence, cigarettes, with unspecified nicotine-induced disorders: Secondary | ICD-10-CM | POA: Diagnosis not present

## 2018-09-15 DIAGNOSIS — M542 Cervicalgia: Secondary | ICD-10-CM | POA: Diagnosis not present

## 2018-09-24 DIAGNOSIS — I10 Essential (primary) hypertension: Secondary | ICD-10-CM | POA: Diagnosis not present

## 2018-09-24 DIAGNOSIS — D649 Anemia, unspecified: Secondary | ICD-10-CM | POA: Diagnosis not present

## 2018-09-24 DIAGNOSIS — J449 Chronic obstructive pulmonary disease, unspecified: Secondary | ICD-10-CM | POA: Diagnosis not present

## 2018-09-24 DIAGNOSIS — R030 Elevated blood-pressure reading, without diagnosis of hypertension: Secondary | ICD-10-CM | POA: Diagnosis not present

## 2018-09-24 DIAGNOSIS — F319 Bipolar disorder, unspecified: Secondary | ICD-10-CM | POA: Diagnosis not present

## 2018-09-24 DIAGNOSIS — J441 Chronic obstructive pulmonary disease with (acute) exacerbation: Secondary | ICD-10-CM | POA: Diagnosis not present

## 2018-09-24 DIAGNOSIS — R7301 Impaired fasting glucose: Secondary | ICD-10-CM | POA: Diagnosis not present

## 2018-09-24 DIAGNOSIS — E782 Mixed hyperlipidemia: Secondary | ICD-10-CM | POA: Diagnosis not present

## 2018-09-24 DIAGNOSIS — K219 Gastro-esophageal reflux disease without esophagitis: Secondary | ICD-10-CM | POA: Diagnosis not present

## 2018-09-27 DIAGNOSIS — M542 Cervicalgia: Secondary | ICD-10-CM | POA: Diagnosis not present

## 2018-09-27 DIAGNOSIS — G8929 Other chronic pain: Secondary | ICD-10-CM | POA: Diagnosis not present

## 2018-09-27 DIAGNOSIS — M545 Low back pain: Secondary | ICD-10-CM | POA: Diagnosis not present

## 2018-10-04 DIAGNOSIS — I10 Essential (primary) hypertension: Secondary | ICD-10-CM | POA: Diagnosis not present

## 2018-10-04 DIAGNOSIS — M4802 Spinal stenosis, cervical region: Secondary | ICD-10-CM | POA: Diagnosis not present

## 2018-10-04 DIAGNOSIS — M4800 Spinal stenosis, site unspecified: Secondary | ICD-10-CM | POA: Insufficient documentation

## 2018-10-04 DIAGNOSIS — M5412 Radiculopathy, cervical region: Secondary | ICD-10-CM | POA: Diagnosis not present

## 2018-10-04 DIAGNOSIS — Z6830 Body mass index (BMI) 30.0-30.9, adult: Secondary | ICD-10-CM | POA: Diagnosis not present

## 2018-10-13 DIAGNOSIS — M5412 Radiculopathy, cervical region: Secondary | ICD-10-CM | POA: Diagnosis not present

## 2018-10-13 DIAGNOSIS — M4802 Spinal stenosis, cervical region: Secondary | ICD-10-CM | POA: Diagnosis not present

## 2018-10-14 ENCOUNTER — Encounter (HOSPITAL_COMMUNITY): Payer: Self-pay | Admitting: Psychiatry

## 2018-10-14 ENCOUNTER — Ambulatory Visit (HOSPITAL_COMMUNITY): Payer: PPO | Admitting: Psychiatry

## 2018-10-14 VITALS — BP 133/84 | HR 103 | Ht 70.0 in | Wt 215.0 lb

## 2018-10-14 DIAGNOSIS — F900 Attention-deficit hyperactivity disorder, predominantly inattentive type: Secondary | ICD-10-CM | POA: Diagnosis not present

## 2018-10-14 DIAGNOSIS — F3162 Bipolar disorder, current episode mixed, moderate: Secondary | ICD-10-CM | POA: Diagnosis not present

## 2018-10-14 MED ORDER — LISDEXAMFETAMINE DIMESYLATE 70 MG PO CAPS: 70.0000 mg | ORAL_CAPSULE | Freq: Every day | ORAL | 0 refills | Status: DC

## 2018-10-14 MED ORDER — LAMOTRIGINE 200 MG PO TABS: 200.0000 mg | ORAL_TABLET | Freq: Every day | ORAL | 2 refills | Status: DC

## 2018-10-14 NOTE — Progress Notes (Signed)
[facility] MD/PA/NP OP Progress Note  10/14/2018 10:33 AM Philip Gates  MRN:  [id number]  Chief Complaint:  Chief Complaint    Anxiety; ADHD; Agitation; Follow-up     HPI: This patient is a63 year old married white male lives with his wife in [place]. 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  The patient was referred by his primary physician, Dr. Wende Neighbors, for further assessment and treatment of bipolar disorder and ADHD.  The patient is a vague historian and he tends to ramble a lot. Apparently as a child he was very unfocused and hyperactive. He is always had difficulty completing tasks. He was never diagnosed with ADHD in childhood and he was able to complete high school and 1 year of machine shop training. He has worked in numerous jobs in Film/video editor.  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 He quit all this about 10 years ago. About 7 years ago he became increasingly angry and moody and irritable. Some of it started when he and his wife are building a house and he felt like the contractor "screwed me out of $100,000.". He began seeing Dr. Candis Schatz in [place] and was diagnosed as being bipolar because he had significant mood swings anger and irritability. Since that he's been on a combination of Lamictal and Depakote which he thinks has helped. Because of his long-term difficulties with focus and concentration he was started on Vyvanse which he also thinks has helped.  The patient still has some difficulties with irritability but not like he used to. He and his wife don't get along but he admits that he was having an affair with an old girlfriend for the last few years and still talks to this woman periodically. His wife found out about it and is quite resentful. He also has significant memory problems and often loses his train of thought. He  denies any head injuries. 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 He has numerous medical problems and has chronic pain from arthritis. Last year he also had knee surgery and is well as a small bowel obstruction.  The patient no longer uses drugs or alcohol. He is active in church and does a lot of things with his cousin. His energy is pretty good and he states he sleeps fairly well. He has never had psychiatric hospitalization or treatment for drug or alcohol abuse  The patient returns after 3 months.  He states he has been doing okay but he "gets upset when I come appear."  He claims he is still upset about the incident last year in which his wife called and stated that he was violent and threatening towards her.  Because of these allegations I was not able to agree to allow him to have a concealed carry gun permit.  This was also checked through the [facility] health risk management office and they also advised that this was unsafe to recommend.  He takes this personally and I explained this was not a personal issue but would be the same for any patient with a history of alcohol abuse violent behaviors when intoxicated bipolar disorder etc.  I explained if he was still that unhappy that it precluded a positive patient physician relationship we would be happy to refer him elsewhere.  He declined this claiming "it is already in my record anyway."  He does state that he is not had any further altercations with his wife he has been medication compliant and has been staying away from alcohol.  He is sleeping and eating well and spending much of his time coaching his grandson and his baseball. Visit Diagnosis:    ICD-10-CM   1. Attention deficit hyperactivity disorder (ADHD), predominantly inattentive type F90.0   2. Bipolar 1 disorder, mixed, moderate (HCC) F31.62     Past Psychiatric History: Past outpatient treatment for bipolar disorder  Past Medical History:  Past Medical History:   Diagnosis Date  . Acid reflux   . 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 Arthritis   . Asthma    ACTIVITY INDUCED  . Bipolar 1 disorder ([place])   . BPH (benign prostatic hyperplasia)   . Cellulitis 05/2014   right side face  . Cerebral aneurysm    has a stent  . GERD (gastroesophageal reflux disease)   . Neck pain, chronic   . Sinus drainage   . 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  . Venous insufficiency     Past Surgical History:  Procedure Laterality Date  . cerebral stent    . COLONOSCOPY N/A 01/02/2017   Procedure: COLONOSCOPY;  Surgeon: Rogene Houston, MD;  [facility];  Service: Endoscopy;  Laterality: N/A;  . dental implant  09/26/2014   left lower dental implant  . ESOPHAGOGASTRODUODENOSCOPY N/A 01/02/2017   Procedure: ESOPHAGOGASTRODUODENOSCOPY (EGD);  Surgeon: Rogene Houston, MD;  [facility]: [facility];  Service: Endoscopy;  Laterality: N/A;  910  . EYE SURGERY Left Jan. 16, 2017   Cataract  . IR ANGIO INTRA EXTRACRAN SEL INTERNAL CAROTID BILAT MOD SED  01/20/2017  . IR ANGIO VERTEBRAL SEL VERTEBRAL UNI L MOD SED  01/20/2017  . JOINT REPLACEMENT Right    Knee  . JOINT REPLACEMENT Left    Hip  . NOSE SURGERY    . RADIOLOGY WITH ANESTHESIA N/A 12/29/2014   Procedure: Embolization;  Surgeon: Consuella Lose, MD;  Location: [facility];  Service: Radiology;  Laterality: N/A;  . SUBCLAVIAN ANGIOGRAM  Nov. 8, 2016  . TOTAL HIP ARTHROPLASTY Left 01/03/2013   Procedure: TOTAL HIP ARTHROPLASTY ANTERIOR APPROACH;  Surgeon: Gearlean Alf, MD;  Location: [facility];  Service: Orthopedics;  Laterality: Left;  . TOTAL KNEE ARTHROPLASTY Right 10/06/2014   Procedure: RIGHT TOTAL KNEE ARTHROPLASTY;  Surgeon: Gearlean Alf, MD;  Location: [facility];  Service: Orthopedics;  Laterality: Right;    Family Psychiatric History: See below  Family History:  Family History  Problem Relation Age of Onset   . Arrhythmia Mother        Has pacemaker  . Hypertension Mother   . Heart disease Mother   . Mental illness Mother   . Varicose Veins Mother   . Depression Mother   . Colon cancer Father   . Cancer Father        Prostate and Colon  . Diabetes Father   . Hypertension Father   . Cancer - Colon Father   . Depression Sister     Social History:  Social History   Socioeconomic History  . Marital status: Married    Spouse name: Not on file  . Number of children: Not on file  . Years of education: Not on file  . Highest education level: Not on file  Occupational History  .  Not on file  Social Needs  . Financial resource strain: Not on file  . Food insecurity:    Worry: Not on file    Inability: Not on file  . Transportation needs:    Medical: Not on file    Non-medical: Not on file  Tobacco Use  . Smoking status: Former Smoker    Packs/day: 0.50    Types: Cigarettes    Start date: 05/23/2015    Last attempt to quit: 10/09/2017    Years since quitting: 1.0  . Smokeless tobacco: Former Systems developer  . Tobacco comment: Stated "I smoke off and on"  Substance and Sexual Activity  . Alcohol use: No    Alcohol/week: 0.0 standard drinks    Comment: 10-12-15 per pt no  . Drug use: No    Comment: 10-12-15 per tp no  . Sexual activity: Yes    Partners: Female  Lifestyle  . Physical activity:    Days per week: Not on file    Minutes per session: Not on file  . Stress: Not on file  Relationships  . Social connections:    Talks on phone: Not on file    Gets together: Not on file    Attends religious service: Not on file    Active member of club or organization: Not on file    Attends meetings of clubs or organizations: Not on file    Relationship status: Not on file  Other Topics Concern  . Not on file  Social History Narrative  . Not on file    Allergies:  Allergies  Allergen Reactions  . Erythromycin     Caused a "twisted stomach"  . Penicillins Rash    Metabolic  Disorder Labs: No results found for: HGBA1C, MPG No results found for: PROLACTIN No results found for: CHOL, TRIG, HDL, CHOLHDL, VLDL, LDLCALC No results found for: TSH  Therapeutic Level Labs: No results found for: LITHIUM No results found for: VALPROATE No components found for:  CBMZ  Current Medications: Current Outpatient Medications  Medication Sig Dispense Refill  . aspirin 325 MG tablet Take 325 mg by mouth daily.    . cyclobenzaprine (FLEXERIL) 5 MG tablet     . dexlansoprazole (DEXILANT) 60 MG capsule Take 60 mg by mouth every morning.     . Dutasteride-Tamsulosin HCl (JALYN) 0.5-0.4 MG CAPS Take 1 capsule by mouth daily at 12 noon.     . ferrous sulfate 325 (65 FE) MG tablet Take 325 mg by mouth daily with breakfast.    . HYDROcodone-acetaminophen (NORCO) 10-325 MG tablet Take 1 tablet by mouth every 6 (six) hours as needed for moderate pain.    Marland Kitchen ibuprofen (ADVIL,MOTRIN) 200 MG tablet Take 800 mg by mouth every 8 (eight) hours as needed for mild pain or moderate pain.    Marland Kitchen ipratropium (ATROVENT) 0.03 % nasal spray Place 2 sprays into both nostrils every 12 (twelve) hours as needed for rhinitis. Reported on 10/23/2015    . lamoTRIgine (LAMICTAL) 200 MG tablet Take 1 tablet (200 mg total) by mouth at bedtime. 30 tablet 2  . levalbuterol (XOPENEX HFA) 45 MCG/ACT inhaler Inhale 2 puffs into the lungs every 6 (six) hours as needed for wheezing.    Marland Kitchen levocetirizine (XYZAL) 5 MG tablet Take 1 tablet by mouth at bedtime.    Marland Kitchen lisdexamfetamine (VYVANSE) 70 MG capsule Take 1 capsule (70 mg total) by mouth daily. 30 capsule 0  . lisdexamfetamine (VYVANSE) 70 MG capsule Take 1 capsule (70  mg total) by mouth daily. 30 capsule 0  . lisdexamfetamine (VYVANSE) 70 MG capsule Take 1 capsule (70 mg total) by mouth daily. 30 capsule 0  . losartan-hydrochlorothiazide (HYZAAR) 100-25 MG tablet     . montelukast (SINGULAIR) 10 MG tablet Take 10 mg by mouth every morning.     . Multiple  Vitamins-Minerals (MULTIVITAMIN WITH MINERALS) tablet Take 1 tablet by mouth daily.    Marland Kitchen neomycin-polymyxin-hydrocortisone (CORTISPORIN) OTIC solution 1-2 drops to toe twice daily after soaking 10 mL 0  . neomycin-polymyxin-hydrocortisone (CORTISPORIN) OTIC solution Apply 1-2 drops to toe after soaking twice a day 10 mL 0  . Pediatric Multivitamins-Iron (FLINTSTONES PLUS IRON) chewable tablet Chew 1 tablet by mouth 2 (two) times daily.    . predniSONE (DELTASONE) 10 MG tablet     . PROAIR HFA 108 (90 Base) MCG/ACT inhaler     . promethazine (PHENERGAN) 25 MG tablet Take 25 mg by mouth every 6 (six) hours as needed for nausea or vomiting.    . ranitidine (ZANTAC) 150 MG capsule Take 150 mg by mouth daily as needed for heartburn.    . tadalafil (CIALIS) 20 MG tablet Take 20 mg by mouth daily as needed for erectile dysfunction.    . terbinafine (LAMISIL) 250 MG tablet Take 1 tablet (250 mg total) by mouth daily. 90 tablet 0  . testosterone cypionate (DEPOTESTOTERONE CYPIONATE) 200 MG/ML injection Inject 200 mg into the muscle every 14 (fourteen) days.      No current facility-administered medications for this visit.      Musculoskeletal: Strength & Muscle Tone: within normal limits Gait & Station: normal Patient leans: N/A  Psychiatric Specialty Exam: Review of Systems  Musculoskeletal: Positive for back pain, joint pain and neck pain.  Neurological: Positive for tingling.  All other systems reviewed and are negative.   Blood pressure 133/84, pulse (!) 103, height 5\' 10"  (1.778 m), weight 215 lb (97.5 kg), SpO2 99 %.Body mass index is 30.85 kg/m.  General Appearance: Casual and Fairly Groomed  Eye Contact:  Good  Speech:  Clear and Coherent  Volume:  Normal  Mood:  Irritable  Affect:  Constricted  Thought Process:  Goal Directed  Orientation:  Full (Time, Place, and Person)  Thought Content: Rumination   Suicidal Thoughts:  No  Homicidal Thoughts:  No  Memory:  Immediate;    Good Recent;   Good Remote;   Fair  Judgement:  Fair  Insight:  Shallow  Psychomotor Activity:  Normal  Concentration:  Concentration: Good and Attention Span: Good  Recall:  Good  Fund of Knowledge: Fair  Language: Good  Akathisia:  No  Handed:  Right  AIMS (if indicated): not done  Assets:  Communication Skills Desire for Improvement Resilience Social Support Talents/Skills  ADL's:  Intact  Cognition: WNL  Sleep:  Good   Screenings:   Assessment and Plan: This patient is a 63 year old male with a history of bipolar disorder, alcohol abuse in remission and ADHD.  He states that he continues to do well on his current medications he will continue Vyvanse 70 mg every morning for focus and Lamictal 200 mg daily for mood stabilization.  He will return to see me in 3 months   [name], MD 10/14/2018, 10:33 AM

## 2018-10-27 DIAGNOSIS — R05 Cough: Secondary | ICD-10-CM | POA: Diagnosis not present

## 2018-10-27 DIAGNOSIS — R0602 Shortness of breath: Secondary | ICD-10-CM | POA: Diagnosis not present

## 2018-10-27 DIAGNOSIS — R0981 Nasal congestion: Secondary | ICD-10-CM | POA: Diagnosis not present

## 2018-10-27 DIAGNOSIS — J060 Acute laryngopharyngitis: Secondary | ICD-10-CM | POA: Diagnosis not present

## 2018-11-18 DIAGNOSIS — J219 Acute bronchiolitis, unspecified: Secondary | ICD-10-CM | POA: Diagnosis not present

## 2018-11-18 DIAGNOSIS — J060 Acute laryngopharyngitis: Secondary | ICD-10-CM | POA: Diagnosis not present

## 2018-11-29 DIAGNOSIS — F319 Bipolar disorder, unspecified: Secondary | ICD-10-CM | POA: Diagnosis not present

## 2018-11-29 DIAGNOSIS — E782 Mixed hyperlipidemia: Secondary | ICD-10-CM | POA: Diagnosis not present

## 2018-11-29 DIAGNOSIS — J449 Chronic obstructive pulmonary disease, unspecified: Secondary | ICD-10-CM | POA: Diagnosis not present

## 2018-11-29 DIAGNOSIS — R7301 Impaired fasting glucose: Secondary | ICD-10-CM | POA: Diagnosis not present

## 2018-11-29 DIAGNOSIS — I10 Essential (primary) hypertension: Secondary | ICD-10-CM | POA: Diagnosis not present

## 2018-11-29 DIAGNOSIS — J441 Chronic obstructive pulmonary disease with (acute) exacerbation: Secondary | ICD-10-CM | POA: Diagnosis not present

## 2018-11-29 DIAGNOSIS — R030 Elevated blood-pressure reading, without diagnosis of hypertension: Secondary | ICD-10-CM | POA: Diagnosis not present

## 2018-11-29 DIAGNOSIS — K219 Gastro-esophageal reflux disease without esophagitis: Secondary | ICD-10-CM | POA: Diagnosis not present

## 2018-11-29 DIAGNOSIS — D649 Anemia, unspecified: Secondary | ICD-10-CM | POA: Diagnosis not present

## 2019-01-07 DIAGNOSIS — I10 Essential (primary) hypertension: Secondary | ICD-10-CM | POA: Diagnosis not present

## 2019-01-07 DIAGNOSIS — J441 Chronic obstructive pulmonary disease with (acute) exacerbation: Secondary | ICD-10-CM | POA: Diagnosis not present

## 2019-01-07 DIAGNOSIS — E782 Mixed hyperlipidemia: Secondary | ICD-10-CM | POA: Diagnosis not present

## 2019-01-07 DIAGNOSIS — R7301 Impaired fasting glucose: Secondary | ICD-10-CM | POA: Diagnosis not present

## 2019-01-10 DIAGNOSIS — M545 Low back pain: Secondary | ICD-10-CM | POA: Diagnosis not present

## 2019-01-10 DIAGNOSIS — M5412 Radiculopathy, cervical region: Secondary | ICD-10-CM | POA: Diagnosis not present

## 2019-01-10 DIAGNOSIS — E291 Testicular hypofunction: Secondary | ICD-10-CM | POA: Diagnosis not present

## 2019-01-10 DIAGNOSIS — G8929 Other chronic pain: Secondary | ICD-10-CM | POA: Diagnosis not present

## 2019-01-12 ENCOUNTER — Encounter (HOSPITAL_COMMUNITY): Payer: Self-pay | Admitting: Psychiatry

## 2019-01-12 ENCOUNTER — Other Ambulatory Visit: Payer: Self-pay

## 2019-01-12 ENCOUNTER — Ambulatory Visit (INDEPENDENT_AMBULATORY_CARE_PROVIDER_SITE_OTHER): Payer: PPO | Admitting: Psychiatry

## 2019-01-12 DIAGNOSIS — F900 Attention-deficit hyperactivity disorder, predominantly inattentive type: Secondary | ICD-10-CM | POA: Diagnosis not present

## 2019-01-12 DIAGNOSIS — F3162 Bipolar disorder, current episode mixed, moderate: Secondary | ICD-10-CM

## 2019-01-12 MED ORDER — LISDEXAMFETAMINE DIMESYLATE 70 MG PO CAPS: 70.0000 mg | ORAL_CAPSULE | Freq: Every day | ORAL | 0 refills | Status: DC

## 2019-01-12 MED ORDER — LAMOTRIGINE 200 MG PO TABS: 200.0000 mg | ORAL_TABLET | Freq: Every day | ORAL | 2 refills | Status: DC

## 2019-01-12 NOTE — Progress Notes (Signed)
Virtual Visit via Telephone Note  I connected with Philip Gates on 01/12/19 at  9:20 AM EDT by telephone and verified that I am speaking with the correct person using two identifiers.   I discussed the limitations, risks, security and privacy concerns of performing an evaluation and management service by telephone and the availability of in person appointments. I also discussed with the patient that there may be a patient responsible charge related to this service. The patient expressed understanding and agreed to proceed.       I discussed the assessment and treatment plan with the patient. The patient was provided an opportunity to ask questions and all were answered. The patient agreed with the plan and demonstrated an understanding of the instructions.   The patient was advised to call back or seek an in-person evaluation if the symptoms worsen or if the condition fails to improve as anticipated.  I provided 15 minutes of non-face-to-face time during this encounter.   [name], MD  [facility] MD/PA/NP OP Progress Note  01/12/2019 9:55 AM Philip Gates  MRN:  [id number]  Chief Complaint:  Chief Complaint    ADHD; Manic Behavior; Follow-up     HPI: This patient is a63 year old married white male lives with his wife in [place]. 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  The patient was referred by his primary physician, Dr. Wende Neighbors, for further assessment and treatment of bipolar disorder and ADHD.  The patient is a vague historian and he tends to ramble a lot. Apparently as a child he was very unfocused and hyperactive. He is always had difficulty completing tasks. He was never diagnosed with ADHD in childhood and he was able to complete high school and 1 year of machine shop training. He has worked in numerous jobs in Film/video editor.  The patient drank heavily and used drugs like marijuana  and cocaine for quite a number of years. He quit all this about 10 years ago. About 7 years ago he became increasingly angry and moody and irritable. Some of it started when he and his wife are building a house and he felt like the contractor "screwed me out of $100,000.". He began seeing Dr. Candis Schatz in [place] and was diagnosed as being bipolar because he had significant mood swings anger and irritability. Since that he's been on a combination of Lamictal and Depakote which he thinks has helped. Because of his long-term difficulties with focus and concentration he was started on Vyvanse which he also thinks has helped.  The patient still has some difficulties with irritability but not like he used to. He and his wife don't get along but he admits that he was having an affair with an old girlfriend for the last few years and still talks to this woman periodically. His wife found out about it and is quite resentful. He also has significant memory problems and often loses his train of thought. He denies any head injuries. 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 He has numerous medical problems and has chronic pain from arthritis. Last year he also had knee surgery and is well as a small bowel obstruction.  The patient no longer uses drugs or alcohol. He is active in church and does a lot of things with his cousin. His energy is pretty good and he states he sleeps fairly well. He has never had psychiatric  hospitalization or treatment for drug or alcohol abuse  The patient returns after 3 months and is assessed via telephone due to the coronavirus pandemic.  He states that he is generally doing well.  His back is been hurting a lot and he is going to have to see a physician about this.  For the most part he is sleeping well denies significant problems with mood swings irritability or anger.  He and his wife are getting along well by his report.  He is not drinking at all.  He states  that his medicines for mood swings and focus seem to be working well and he denies any symptoms of depression thoughts of self-harm or harm to others. Visit Diagnosis:    ICD-10-CM   1. Attention deficit hyperactivity disorder (ADHD), predominantly inattentive type F90.0   2. Bipolar 1 disorder, mixed, moderate (HCC) F31.62     Past Psychiatric History: Past outpatient treatment for bipolar disorder  Past Medical History:  Past Medical History:  Diagnosis Date  . Acid reflux   . 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 Arthritis   . Asthma    ACTIVITY INDUCED  . Bipolar 1 disorder ([place])   . BPH (benign prostatic hyperplasia)   . Cellulitis 05/2014   right side face  . Cerebral aneurysm    has a stent  . GERD (gastroesophageal reflux disease)   . Neck pain, chronic   . Sinus drainage   . 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  . Venous insufficiency     Past Surgical History:  Procedure Laterality Date  . cerebral stent    . COLONOSCOPY N/A 01/02/2017   Procedure: COLONOSCOPY;  Surgeon: Rogene Houston, MD;  [facility]: [facility];  Service: Endoscopy;  Laterality: N/A;  . dental implant  09/26/2014   left lower dental implant  . ESOPHAGOGASTRODUODENOSCOPY N/A 01/02/2017   Procedure: ESOPHAGOGASTRODUODENOSCOPY (EGD);  Surgeon: Rogene Houston, MD;  Location: [facility];  Service: Endoscopy;  Laterality: N/A;  910  . EYE SURGERY Left Jan. 16, 2017   Cataract  . IR ANGIO INTRA EXTRACRAN SEL INTERNAL CAROTID BILAT MOD SED  01/20/2017  . IR ANGIO VERTEBRAL SEL VERTEBRAL UNI L MOD SED  01/20/2017  . JOINT REPLACEMENT Right    Knee  . JOINT REPLACEMENT Left    Hip  . NOSE SURGERY    . RADIOLOGY WITH ANESTHESIA N/A 12/29/2014   Procedure: Embolization;  Surgeon: Consuella Lose, MD;  Location: [facility];  Service: Radiology;  Laterality: N/A;  . SUBCLAVIAN ANGIOGRAM  Nov. 8, 2016  . TOTAL HIP ARTHROPLASTY  Left 01/03/2013   Procedure: TOTAL HIP ARTHROPLASTY ANTERIOR APPROACH;  Surgeon: Gearlean Alf, MD;  Location: [facility];  Service: Orthopedics;  Laterality: Left;  . TOTAL KNEE ARTHROPLASTY Right 10/06/2014   Procedure: RIGHT TOTAL KNEE ARTHROPLASTY;  Surgeon: Gearlean Alf, MD;  Location: [facility];  Service: Orthopedics;  Laterality: Right;    Family Psychiatric History: See below  Family History:  Family History  Problem Relation Age of Onset  . Arrhythmia Mother        Has pacemaker  . Hypertension Mother   . Heart disease Mother   . Mental illness Mother   . Varicose Veins Mother   . Depression Mother   . Colon cancer Father   . Cancer Father        Prostate and Colon  .  Diabetes Father   . Hypertension Father   . Cancer - Colon Father   . Depression Sister     Social History:  Social History   Socioeconomic History  . Marital status: Married    Spouse name: Not on file  . Number of children: Not on file  . Years of education: Not on file  . Highest education level: Not on file  Occupational History  . Not on file  Social Needs  . Financial resource strain: Not on file  . Food insecurity:    Worry: Not on file    Inability: Not on file  . Transportation needs:    Medical: Not on file    Non-medical: Not on file  Tobacco Use  . Smoking status: Former Smoker    Packs/day: 0.50    Types: Cigarettes    Start date: 05/23/2015    Last attempt to quit: 10/09/2017    Years since quitting: 1.2  . Smokeless tobacco: Former Systems developer  . Tobacco comment: Stated "I smoke off and on"  Substance and Sexual Activity  . Alcohol use: No    Alcohol/week: 0.0 standard drinks    Comment: 10-12-15 per pt no  . Drug use: No    Comment: 10-12-15 per tp no  . Sexual activity: Yes    Partners: Female  Lifestyle  . Physical activity:    Days per week: Not on file    Minutes per session: Not on file  . Stress: Not on file  Relationships  . Social connections:    Talks on phone: Not on  file    Gets together: Not on file    Attends religious service: Not on file    Active member of club or organization: Not on file    Attends meetings of clubs or organizations: Not on file    Relationship status: Not on file  Other Topics Concern  . Not on file  Social History Narrative  . Not on file    Allergies:  Allergies  Allergen Reactions  . Erythromycin     Caused a "twisted stomach"  . Penicillins Rash    Metabolic Disorder Labs: No results found for: HGBA1C, MPG No results found for: PROLACTIN No results found for: CHOL, TRIG, HDL, CHOLHDL, VLDL, LDLCALC No results found for: TSH  Therapeutic Level Labs: No results found for: LITHIUM No results found for: VALPROATE No components found for:  CBMZ  Current Medications: Current Outpatient Medications  Medication Sig Dispense Refill  . aspirin 325 MG tablet Take 325 mg by mouth daily.    . cyclobenzaprine (FLEXERIL) 5 MG tablet     . dexlansoprazole (DEXILANT) 60 MG capsule Take 60 mg by mouth every morning.     . Dutasteride-Tamsulosin HCl (JALYN) 0.5-0.4 MG CAPS Take 1 capsule by mouth daily at 12 noon.     . ferrous sulfate 325 (65 FE) MG tablet Take 325 mg by mouth daily with breakfast.    . HYDROcodone-acetaminophen (NORCO) 10-325 MG tablet Take 1 tablet by mouth every 6 (six) hours as needed for moderate pain.    Marland Kitchen ibuprofen (ADVIL,MOTRIN) 200 MG tablet Take 800 mg by mouth every 8 (eight) hours as needed for mild pain or moderate pain.    Marland Kitchen ipratropium (ATROVENT) 0.03 % nasal spray Place 2 sprays into both nostrils every 12 (twelve) hours as needed for rhinitis. Reported on 10/23/2015    . lamoTRIgine (LAMICTAL) 200 MG tablet Take 1 tablet (200 mg total) by mouth at  bedtime. 30 tablet 2  . levalbuterol (XOPENEX HFA) 45 MCG/ACT inhaler Inhale 2 puffs into the lungs every 6 (six) hours as needed for wheezing.    Marland Kitchen levocetirizine (XYZAL) 5 MG tablet Take 1 tablet by mouth at bedtime.    Marland Kitchen lisdexamfetamine  (VYVANSE) 70 MG capsule Take 1 capsule (70 mg total) by mouth daily. 30 capsule 0  . lisdexamfetamine (VYVANSE) 70 MG capsule Take 1 capsule (70 mg total) by mouth daily. 30 capsule 0  . lisdexamfetamine (VYVANSE) 70 MG capsule Take 1 capsule (70 mg total) by mouth daily. 30 capsule 0  . losartan-hydrochlorothiazide (HYZAAR) 100-25 MG tablet     . montelukast (SINGULAIR) 10 MG tablet Take 10 mg by mouth every morning.     . Multiple Vitamins-Minerals (MULTIVITAMIN WITH MINERALS) tablet Take 1 tablet by mouth daily.    Marland Kitchen neomycin-polymyxin-hydrocortisone (CORTISPORIN) OTIC solution 1-2 drops to toe twice daily after soaking 10 mL 0  . neomycin-polymyxin-hydrocortisone (CORTISPORIN) OTIC solution Apply 1-2 drops to toe after soaking twice a day 10 mL 0  . Pediatric Multivitamins-Iron (FLINTSTONES PLUS IRON) chewable tablet Chew 1 tablet by mouth 2 (two) times daily.    . predniSONE (DELTASONE) 10 MG tablet     . PROAIR HFA 108 (90 Base) MCG/ACT inhaler     . promethazine (PHENERGAN) 25 MG tablet Take 25 mg by mouth every 6 (six) hours as needed for nausea or vomiting.    . ranitidine (ZANTAC) 150 MG capsule Take 150 mg by mouth daily as needed for heartburn.    . tadalafil (CIALIS) 20 MG tablet Take 20 mg by mouth daily as needed for erectile dysfunction.    . terbinafine (LAMISIL) 250 MG tablet Take 1 tablet (250 mg total) by mouth daily. 90 tablet 0  . testosterone cypionate (DEPOTESTOTERONE CYPIONATE) 200 MG/ML injection Inject 200 mg into the muscle every 14 (fourteen) days.      No current facility-administered medications for this visit.      Musculoskeletal: Strength & Muscle Tone: within normal limits Gait & Station: normal Patient leans: N/A  Psychiatric Specialty Exam: Review of Systems  Musculoskeletal: Positive for back pain.  All other systems reviewed and are negative.   There were no vitals taken for this visit.There is no height or weight on file to calculate BMI.   General Appearance: NA  Eye Contact:  NA  Speech:  Clear and Coherent  Volume:  Normal  Mood:  Euthymic  Affect:  n/a  Thought Process:  Goal Directed  Orientation:  Full (Time, Place, and Person)  Thought Content: WDL   Suicidal Thoughts:  No  Homicidal Thoughts:  No  Memory:  Immediate;   Good Recent;   Good Remote;   Fair  Judgement:  Fair  Insight:  Fair  Psychomotor Activity:  Normal  Concentration:  Concentration: Good and Attention Span: Good  Recall:  Good  Fund of Knowledge: Good  Language: Good  Akathisia:  No  Handed:  Right  AIMS (if indicated): not done  Assets:  Communication Skills Desire for Improvement Resilience Social Support Talents/Skills  ADL's:  Intact  Cognition: WNL  Sleep:  Good   Screenings:   Assessment and Plan: This patient is a 63 year old male with a history of bipolar disorder and ADHD.  He is continuing to do well on his current regimen.  He will continue Lamictal 200 mg at bedtime for mood stabilization and Vyvanse 70 mg every morning for focus.  He will return to see me  in 3 months   [name], MD 01/12/2019, 9:55 AM

## 2019-01-13 DIAGNOSIS — Z0000 Encounter for general adult medical examination without abnormal findings: Secondary | ICD-10-CM | POA: Diagnosis not present

## 2019-01-19 DIAGNOSIS — M5126 Other intervertebral disc displacement, lumbar region: Secondary | ICD-10-CM | POA: Insufficient documentation

## 2019-01-19 DIAGNOSIS — I10 Essential (primary) hypertension: Secondary | ICD-10-CM | POA: Diagnosis not present

## 2019-01-19 DIAGNOSIS — M48062 Spinal stenosis, lumbar region with neurogenic claudication: Secondary | ICD-10-CM | POA: Diagnosis not present

## 2019-01-19 DIAGNOSIS — M48061 Spinal stenosis, lumbar region without neurogenic claudication: Secondary | ICD-10-CM | POA: Diagnosis present

## 2019-01-19 DIAGNOSIS — Z6830 Body mass index (BMI) 30.0-30.9, adult: Secondary | ICD-10-CM | POA: Diagnosis not present

## 2019-02-02 DIAGNOSIS — M5412 Radiculopathy, cervical region: Secondary | ICD-10-CM | POA: Diagnosis not present

## 2019-02-07 DIAGNOSIS — I8391 Asymptomatic varicose veins of right lower extremity: Secondary | ICD-10-CM | POA: Diagnosis not present

## 2019-02-07 DIAGNOSIS — Z85828 Personal history of other malignant neoplasm of skin: Secondary | ICD-10-CM | POA: Diagnosis not present

## 2019-02-07 DIAGNOSIS — L239 Allergic contact dermatitis, unspecified cause: Secondary | ICD-10-CM | POA: Diagnosis not present

## 2019-02-07 DIAGNOSIS — L821 Other seborrheic keratosis: Secondary | ICD-10-CM | POA: Diagnosis not present

## 2019-02-07 DIAGNOSIS — L570 Actinic keratosis: Secondary | ICD-10-CM | POA: Diagnosis not present

## 2019-02-07 DIAGNOSIS — C44519 Basal cell carcinoma of skin of other part of trunk: Secondary | ICD-10-CM | POA: Diagnosis not present

## 2019-03-07 DIAGNOSIS — H40033 Anatomical narrow angle, bilateral: Secondary | ICD-10-CM | POA: Diagnosis not present

## 2019-03-07 DIAGNOSIS — Z961 Presence of intraocular lens: Secondary | ICD-10-CM | POA: Diagnosis not present

## 2019-03-07 DIAGNOSIS — H43813 Vitreous degeneration, bilateral: Secondary | ICD-10-CM | POA: Diagnosis not present

## 2019-03-07 DIAGNOSIS — H2511 Age-related nuclear cataract, right eye: Secondary | ICD-10-CM | POA: Diagnosis not present

## 2019-03-30 DIAGNOSIS — I10 Essential (primary) hypertension: Secondary | ICD-10-CM | POA: Diagnosis not present

## 2019-03-30 DIAGNOSIS — E782 Mixed hyperlipidemia: Secondary | ICD-10-CM | POA: Diagnosis not present

## 2019-03-30 DIAGNOSIS — R030 Elevated blood-pressure reading, without diagnosis of hypertension: Secondary | ICD-10-CM | POA: Diagnosis not present

## 2019-03-30 DIAGNOSIS — D649 Anemia, unspecified: Secondary | ICD-10-CM | POA: Diagnosis not present

## 2019-03-30 DIAGNOSIS — N529 Male erectile dysfunction, unspecified: Secondary | ICD-10-CM | POA: Diagnosis not present

## 2019-03-30 DIAGNOSIS — J441 Chronic obstructive pulmonary disease with (acute) exacerbation: Secondary | ICD-10-CM | POA: Diagnosis not present

## 2019-03-30 DIAGNOSIS — E291 Testicular hypofunction: Secondary | ICD-10-CM | POA: Diagnosis not present

## 2019-03-30 DIAGNOSIS — R7301 Impaired fasting glucose: Secondary | ICD-10-CM | POA: Diagnosis not present

## 2019-03-30 DIAGNOSIS — F319 Bipolar disorder, unspecified: Secondary | ICD-10-CM | POA: Diagnosis not present

## 2019-03-30 DIAGNOSIS — K219 Gastro-esophageal reflux disease without esophagitis: Secondary | ICD-10-CM | POA: Diagnosis not present

## 2019-03-30 DIAGNOSIS — J449 Chronic obstructive pulmonary disease, unspecified: Secondary | ICD-10-CM | POA: Diagnosis not present

## 2019-04-14 ENCOUNTER — Ambulatory Visit (HOSPITAL_COMMUNITY): Payer: PPO | Admitting: Psychiatry

## 2019-04-14 ENCOUNTER — Other Ambulatory Visit: Payer: Self-pay

## 2019-04-18 DIAGNOSIS — J449 Chronic obstructive pulmonary disease, unspecified: Secondary | ICD-10-CM | POA: Diagnosis not present

## 2019-04-18 DIAGNOSIS — J302 Other seasonal allergic rhinitis: Secondary | ICD-10-CM | POA: Diagnosis not present

## 2019-04-18 DIAGNOSIS — E291 Testicular hypofunction: Secondary | ICD-10-CM | POA: Diagnosis not present

## 2019-04-18 DIAGNOSIS — F319 Bipolar disorder, unspecified: Secondary | ICD-10-CM | POA: Diagnosis not present

## 2019-04-18 DIAGNOSIS — N400 Enlarged prostate without lower urinary tract symptoms: Secondary | ICD-10-CM | POA: Diagnosis not present

## 2019-04-18 DIAGNOSIS — K219 Gastro-esophageal reflux disease without esophagitis: Secondary | ICD-10-CM | POA: Diagnosis not present

## 2019-04-18 DIAGNOSIS — I10 Essential (primary) hypertension: Secondary | ICD-10-CM | POA: Diagnosis not present

## 2019-04-18 DIAGNOSIS — G629 Polyneuropathy, unspecified: Secondary | ICD-10-CM | POA: Diagnosis not present

## 2019-04-18 DIAGNOSIS — I720 Aneurysm of carotid artery: Secondary | ICD-10-CM | POA: Diagnosis not present

## 2019-04-18 DIAGNOSIS — Z79891 Long term (current) use of opiate analgesic: Secondary | ICD-10-CM | POA: Diagnosis not present

## 2019-04-18 DIAGNOSIS — M170 Bilateral primary osteoarthritis of knee: Secondary | ICD-10-CM | POA: Diagnosis not present

## 2019-04-18 DIAGNOSIS — G8929 Other chronic pain: Secondary | ICD-10-CM | POA: Diagnosis not present

## 2019-04-19 ENCOUNTER — Encounter (HOSPITAL_COMMUNITY): Payer: Self-pay | Admitting: Psychiatry

## 2019-04-19 ENCOUNTER — Other Ambulatory Visit: Payer: Self-pay

## 2019-04-19 ENCOUNTER — Ambulatory Visit (INDEPENDENT_AMBULATORY_CARE_PROVIDER_SITE_OTHER): Payer: PPO | Admitting: Psychiatry

## 2019-04-19 DIAGNOSIS — F3162 Bipolar disorder, current episode mixed, moderate: Secondary | ICD-10-CM

## 2019-04-19 DIAGNOSIS — F900 Attention-deficit hyperactivity disorder, predominantly inattentive type: Secondary | ICD-10-CM | POA: Diagnosis not present

## 2019-04-19 MED ORDER — LISDEXAMFETAMINE DIMESYLATE 70 MG PO CAPS: 70.0000 mg | ORAL_CAPSULE | Freq: Every day | ORAL | 0 refills | Status: DC

## 2019-04-19 MED ORDER — LAMOTRIGINE 200 MG PO TABS: 200.0000 mg | ORAL_TABLET | Freq: Every day | ORAL | 2 refills | Status: DC

## 2019-04-19 NOTE — Progress Notes (Signed)
Virtual Visit via Video Note  I connected with Philip Gates on 04/19/19 at  2:20 PM EDT by a video enabled telemedicine application and verified that I am speaking with the correct person using two identifiers.   I discussed the limitations of evaluation and management by telemedicine and the availability of in person appointments. The patient expressed understanding and agreed to proceed.     I discussed the assessment and treatment plan with the patient. The patient was provided an opportunity to ask questions and all were answered. The patient agreed with the plan and demonstrated an understanding of the instructions.   The patient was advised to call back or seek an in-person evaluation if the symptoms worsen or if the condition fails to improve as anticipated.  I provided 15 minutes of non-face-to-face time during this encounter.   [name], MD  [facility] MD/PA/NP OP Progress Note  04/19/2019 2:41 PM Philip Gates  MRN:  [id number]  Chief Complaint:  Chief Complaint    ADD; Manic Behavior; Follow-up     HPI: This patient is a63 year old married white male lives with his wife in [place]. He used to work as a Consulting civil engineer for a [employer] but has been retired for about one year on disability.  The patient was referred by his primary physician, Dr. Wende Neighbors, for further assessment and treatment of bipolar disorder and ADHD.  The patient is a vague historian and he tends to ramble a lot. Apparently as a child he was very unfocused and hyperactive. He is always had difficulty completing tasks. He was never diagnosed with ADHD in childhood and he was able to complete high school and 1 year of machine shop training. He has worked in numerous jobs in Film/video editor.  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 He quit all this about 10 years ago. About 7 years ago he became increasingly angry and moody and  irritable. Some of it started when he and his wife are building a house and he felt like the contractor "screwed me out of $100,000.". He began seeing Dr. Candis Schatz in [place] and was diagnosed as being bipolar because he had significant mood swings anger and irritability. Since that he's been on a combination of Lamictal and Depakote which he thinks has helped. Because of his long-term difficulties with focus and concentration he was started on Vyvanse which he also thinks has helped.  The patient still has some difficulties with irritability but not like he used to. He and his wife don't get along but he admits that he was having an affair with an old girlfriend for the last few years and still talks to this woman periodically. His wife found out about it and is quite resentful. He also has significant memory problems and often loses his train of thought. He denies any head injuries. 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 He has numerous medical problems and has chronic pain from arthritis. Last year he also had knee surgery and is well as a small bowel obstruction.  The patient no longer uses drugs or alcohol. He is active in church and does a lot of things with his cousin. His energy is pretty good and he states he sleeps fairly well. He has never had psychiatric hospitalization or treatment for drug or alcohol abuse  The patient returns for follow-up after 3 months.  He states that he is doing  fairly well.  He was helping his grandson with his baseball team and he enjoys this very much.  He denies any significant mood swings or irritability and states that he is getting along well with his wife.  He states that he had a physical yesterday and everything was normal and his blood work was "excellent."  He states that he is still staying focused and has not had any conflicts with anyone. Visit Diagnosis:    ICD-10-CM   1. Attention deficit hyperactivity disorder (ADHD),  predominantly inattentive type  F90.0   2. Bipolar 1 disorder, mixed, moderate (HCC)  F31.62     Past Psychiatric History: Past outpatient treatment for bipolar disorder  Past Medical History:  Past Medical History:  Diagnosis Date  . Acid reflux   . 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 Arthritis   . Asthma    ACTIVITY INDUCED  . Bipolar 1 disorder ([place])   . BPH (benign prostatic hyperplasia)   . Cellulitis 05/2014   right side face  . Cerebral aneurysm    has a stent  . GERD (gastroesophageal reflux disease)   . Neck pain, chronic   . Sinus drainage   . 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  . Venous insufficiency     Past Surgical History:  Procedure Laterality Date  . cerebral stent    . COLONOSCOPY N/A 01/02/2017   Procedure: COLONOSCOPY;  Surgeon: Rogene Houston, MD;  [facility];  Service: Endoscopy;  Laterality: N/A;  . dental implant  09/26/2014   left lower dental implant  . ESOPHAGOGASTRODUODENOSCOPY N/A 01/02/2017   Procedure: ESOPHAGOGASTRODUODENOSCOPY (EGD);  Surgeon: Rogene Houston, MD;  [facility];  Service: Endoscopy;  Laterality: N/A;  910  . EYE SURGERY Left Jan. 16, 2017   Cataract  . IR ANGIO INTRA EXTRACRAN SEL INTERNAL CAROTID BILAT MOD SED  01/20/2017  . IR ANGIO VERTEBRAL SEL VERTEBRAL UNI L MOD SED  01/20/2017  . JOINT REPLACEMENT Right    Knee  . JOINT REPLACEMENT Left    Hip  . NOSE SURGERY    . RADIOLOGY WITH ANESTHESIA N/A 12/29/2014   Procedure: Embolization;  Surgeon: Consuella Lose, MD;  Location: [place];  Service: Radiology;  Laterality: N/A;  . SUBCLAVIAN ANGIOGRAM  Nov. 8, 2016  . TOTAL HIP ARTHROPLASTY Left 01/03/2013   Procedure: TOTAL HIP ARTHROPLASTY ANTERIOR APPROACH;  Surgeon: Gearlean Alf, MD;  Location: [facility];  Service: Orthopedics;  Laterality: Left;  . TOTAL KNEE ARTHROPLASTY Right 10/06/2014   Procedure: RIGHT TOTAL  KNEE ARTHROPLASTY;  Surgeon: Gearlean Alf, MD;  Location: [facility];  Service: Orthopedics;  Laterality: Right;    Family Psychiatric History: see below  Family History:  Family History  Problem Relation Age of Onset  . Arrhythmia Mother        Has pacemaker  . Hypertension Mother   . Heart disease Mother   . Mental illness Mother   . Varicose Veins Mother   . Depression Mother   . Colon cancer Father   . Cancer Father        Prostate and Colon  . Diabetes Father   . Hypertension Father   . Cancer - Colon Father   . Depression Sister     Social History:  Social History   Socioeconomic History  . Marital status: Married    Spouse name: Not  on file  . Number of children: Not on file  . Years of education: Not on file  . Highest education level: Not on file  Occupational History  . Not on file  Social Needs  . Financial resource strain: Not on file  . Food insecurity    Worry: Not on file    Inability: Not on file  . Transportation needs    Medical: Not on file    Non-medical: Not on file  Tobacco Use  . Smoking status: Former Smoker    Packs/day: 0.50    Types: Cigarettes    Start date: 05/23/2015    Quit date: 10/09/2017    Years since quitting: 1.5  . Smokeless tobacco: Former Systems developer  . Tobacco comment: Stated "I smoke off and on"  Substance and Sexual Activity  . Alcohol use: No    Alcohol/week: 0.0 standard drinks    Comment: 10-12-15 per pt no  . Drug use: No    Comment: 10-12-15 per tp no  . Sexual activity: Yes    Partners: Female  Lifestyle  . Physical activity    Days per week: Not on file    Minutes per session: Not on file  . Stress: Not on file  Relationships  . Social Herbalist on phone: Not on file    Gets together: Not on file    Attends religious service: Not on file    Active member of club or organization: Not on file    Attends meetings of clubs or organizations: Not on file    Relationship status: Not on file  Other Topics  Concern  . Not on file  Social History Narrative  . Not on file    Allergies:  Allergies  Allergen Reactions  . Erythromycin     Caused a "twisted stomach"  . Penicillins Rash    Metabolic Disorder Labs: No results found for: HGBA1C, MPG No results found for: PROLACTIN No results found for: CHOL, TRIG, HDL, CHOLHDL, VLDL, LDLCALC No results found for: TSH  Therapeutic Level Labs: No results found for: LITHIUM No results found for: VALPROATE No components found for:  CBMZ  Current Medications: Current Outpatient Medications  Medication Sig Dispense Refill  . aspirin 325 MG tablet Take 325 mg by mouth daily.    . cyclobenzaprine (FLEXERIL) 5 MG tablet     . dexlansoprazole (DEXILANT) 60 MG capsule Take 60 mg by mouth every morning.     . Dutasteride-Tamsulosin HCl (JALYN) 0.5-0.4 MG CAPS Take 1 capsule by mouth daily at 12 noon.     . ferrous sulfate 325 (65 FE) MG tablet Take 325 mg by mouth daily with breakfast.    . HYDROcodone-acetaminophen (NORCO) 10-325 MG tablet Take 1 tablet by mouth every 6 (six) hours as needed for moderate pain.    Marland Kitchen ibuprofen (ADVIL,MOTRIN) 200 MG tablet Take 800 mg by mouth every 8 (eight) hours as needed for mild pain or moderate pain.    Marland Kitchen ipratropium (ATROVENT) 0.03 % nasal spray Place 2 sprays into both nostrils every 12 (twelve) hours as needed for rhinitis. Reported on 10/23/2015    . lamoTRIgine (LAMICTAL) 200 MG tablet Take 1 tablet (200 mg total) by mouth at bedtime. 30 tablet 2  . levalbuterol (XOPENEX HFA) 45 MCG/ACT inhaler Inhale 2 puffs into the lungs every 6 (six) hours as needed for wheezing.    Marland Kitchen levocetirizine (XYZAL) 5 MG tablet Take 1 tablet by mouth at bedtime.    Marland Kitchen  lisdexamfetamine (VYVANSE) 70 MG capsule Take 1 capsule (70 mg total) by mouth daily. 30 capsule 0  . lisdexamfetamine (VYVANSE) 70 MG capsule Take 1 capsule (70 mg total) by mouth daily. 30 capsule 0  . lisdexamfetamine (VYVANSE) 70 MG capsule Take 1 capsule (70 mg  total) by mouth daily. 30 capsule 0  . losartan-hydrochlorothiazide (HYZAAR) 100-25 MG tablet     . montelukast (SINGULAIR) 10 MG tablet Take 10 mg by mouth every morning.     . Multiple Vitamins-Minerals (MULTIVITAMIN WITH MINERALS) tablet Take 1 tablet by mouth daily.    Marland Kitchen neomycin-polymyxin-hydrocortisone (CORTISPORIN) OTIC solution 1-2 drops to toe twice daily after soaking 10 mL 0  . neomycin-polymyxin-hydrocortisone (CORTISPORIN) OTIC solution Apply 1-2 drops to toe after soaking twice a day 10 mL 0  . Pediatric Multivitamins-Iron (FLINTSTONES PLUS IRON) chewable tablet Chew 1 tablet by mouth 2 (two) times daily.    . predniSONE (DELTASONE) 10 MG tablet     . PROAIR HFA 108 (90 Base) MCG/ACT inhaler     . promethazine (PHENERGAN) 25 MG tablet Take 25 mg by mouth every 6 (six) hours as needed for nausea or vomiting.    . ranitidine (ZANTAC) 150 MG capsule Take 150 mg by mouth daily as needed for heartburn.    . tadalafil (CIALIS) 20 MG tablet Take 20 mg by mouth daily as needed for erectile dysfunction.    . terbinafine (LAMISIL) 250 MG tablet Take 1 tablet (250 mg total) by mouth daily. 90 tablet 0  . testosterone cypionate (DEPOTESTOTERONE CYPIONATE) 200 MG/ML injection Inject 200 mg into the muscle every 14 (fourteen) days.      No current facility-administered medications for this visit.      Musculoskeletal: Strength & Muscle Tone: within normal limits Gait & Station: normal Patient leans: N/A  Psychiatric Specialty Exam: Review of Systems  Musculoskeletal: Positive for back pain and neck pain.  All other systems reviewed and are negative.   There were no vitals taken for this visit.There is no height or weight on file to calculate BMI.  General Appearance: Casual and Fairly Groomed  Eye Contact:  Good  Speech:  Clear and Coherent  Volume:  Normal  Mood:  Euthymic  Affect:  Appropriate and Congruent  Thought Process:  Goal Directed  Orientation:  Full (Time, Place, and  Person)  Thought Content: WDL   Suicidal Thoughts:  No  Homicidal Thoughts:  No  Memory:  Immediate;   Good Recent;   Good Remote;   Good  Judgement:  Fair  Insight:  Fair  Psychomotor Activity:  Normal  Concentration:  Concentration: Good and Attention Span: Good  Recall:  Good  Fund of Knowledge: Good  Language: Good  Akathisia:  No  Handed:  Right  AIMS (if indicated): not done  Assets:  Communication Skills Desire for Improvement Physical Health Resilience Social Support Talents/Skills  ADL's:  Intact  Cognition: WNL  Sleep:  Good   Screenings:   Assessment and Plan: This patient is a 63 year old male with a history of bipolar disorder and ADHD.  He continues to do well on his current regimen.  He will continue Lamictal 200 mg at bedtime for mood stabilization and Vyvanse 70 mg every morning for focus.  He will return to see me in 3 months   [name], MD 04/19/2019, 2:41 PM

## 2019-05-13 DIAGNOSIS — D649 Anemia, unspecified: Secondary | ICD-10-CM | POA: Diagnosis not present

## 2019-05-13 DIAGNOSIS — I10 Essential (primary) hypertension: Secondary | ICD-10-CM | POA: Diagnosis not present

## 2019-05-13 DIAGNOSIS — J441 Chronic obstructive pulmonary disease with (acute) exacerbation: Secondary | ICD-10-CM | POA: Diagnosis not present

## 2019-05-13 DIAGNOSIS — R030 Elevated blood-pressure reading, without diagnosis of hypertension: Secondary | ICD-10-CM | POA: Diagnosis not present

## 2019-05-13 DIAGNOSIS — F319 Bipolar disorder, unspecified: Secondary | ICD-10-CM | POA: Diagnosis not present

## 2019-05-13 DIAGNOSIS — R7301 Impaired fasting glucose: Secondary | ICD-10-CM | POA: Diagnosis not present

## 2019-05-13 DIAGNOSIS — J449 Chronic obstructive pulmonary disease, unspecified: Secondary | ICD-10-CM | POA: Diagnosis not present

## 2019-05-13 DIAGNOSIS — K219 Gastro-esophageal reflux disease without esophagitis: Secondary | ICD-10-CM | POA: Diagnosis not present

## 2019-05-13 DIAGNOSIS — E782 Mixed hyperlipidemia: Secondary | ICD-10-CM | POA: Diagnosis not present

## 2019-06-20 DIAGNOSIS — J441 Chronic obstructive pulmonary disease with (acute) exacerbation: Secondary | ICD-10-CM | POA: Diagnosis not present

## 2019-06-20 DIAGNOSIS — R030 Elevated blood-pressure reading, without diagnosis of hypertension: Secondary | ICD-10-CM | POA: Diagnosis not present

## 2019-06-20 DIAGNOSIS — E782 Mixed hyperlipidemia: Secondary | ICD-10-CM | POA: Diagnosis not present

## 2019-06-20 DIAGNOSIS — I10 Essential (primary) hypertension: Secondary | ICD-10-CM | POA: Diagnosis not present

## 2019-06-20 DIAGNOSIS — K219 Gastro-esophageal reflux disease without esophagitis: Secondary | ICD-10-CM | POA: Diagnosis not present

## 2019-06-20 DIAGNOSIS — R7301 Impaired fasting glucose: Secondary | ICD-10-CM | POA: Diagnosis not present

## 2019-06-20 DIAGNOSIS — J449 Chronic obstructive pulmonary disease, unspecified: Secondary | ICD-10-CM | POA: Diagnosis not present

## 2019-06-20 DIAGNOSIS — D649 Anemia, unspecified: Secondary | ICD-10-CM | POA: Diagnosis not present

## 2019-06-20 DIAGNOSIS — F319 Bipolar disorder, unspecified: Secondary | ICD-10-CM | POA: Diagnosis not present

## 2019-07-18 DIAGNOSIS — Z23 Encounter for immunization: Secondary | ICD-10-CM | POA: Diagnosis not present

## 2019-07-20 ENCOUNTER — Ambulatory Visit (INDEPENDENT_AMBULATORY_CARE_PROVIDER_SITE_OTHER): Payer: PPO | Admitting: Psychiatry

## 2019-07-20 ENCOUNTER — Other Ambulatory Visit: Payer: Self-pay

## 2019-07-20 ENCOUNTER — Encounter (HOSPITAL_COMMUNITY): Payer: Self-pay | Admitting: Psychiatry

## 2019-07-20 DIAGNOSIS — F3162 Bipolar disorder, current episode mixed, moderate: Secondary | ICD-10-CM | POA: Diagnosis not present

## 2019-07-20 DIAGNOSIS — F900 Attention-deficit hyperactivity disorder, predominantly inattentive type: Secondary | ICD-10-CM | POA: Diagnosis not present

## 2019-07-20 MED ORDER — LISDEXAMFETAMINE DIMESYLATE 70 MG PO CAPS: 70.0000 mg | ORAL_CAPSULE | Freq: Every day | ORAL | 0 refills | Status: DC

## 2019-07-20 MED ORDER — LAMOTRIGINE 200 MG PO TABS: 200.0000 mg | ORAL_TABLET | Freq: Every day | ORAL | 2 refills | Status: DC

## 2019-07-20 NOTE — Progress Notes (Signed)
Virtual Visit via Telephone Note  I connected with Philip Gates on 07/20/19 at 10:20 AM EST by telephone and verified that I am speaking with the correct person using two identifiers.   I discussed the limitations, risks, security and privacy concerns of performing an evaluation and management service by telephone and the availability of in person appointments. I also discussed with the patient that there may be a patient responsible charge related to this service. The patient expressed understanding and agreed to proceed.     I discussed the assessment and treatment plan with the patient. The patient was provided an opportunity to ask questions and all were answered. The patient agreed with the plan and demonstrated an understanding of the instructions.   The patient was advised to call back or seek an in-person evaluation if the symptoms worsen or if the condition fails to improve as anticipated.  I provided 15 minutes of non-face-to-face time during this encounter.   [name], MD  [facility] MD/PA/NP OP Progress Note  07/20/2019 10:57 AM Philip Gates  MRN:  [id number]  Chief Complaint:  Chief Complaint    ADHD; Manic Behavior; Follow-up     HPI: This patient is a63 year old married white male lives with his wife in [place]. 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  The patient was referred by his primary physician, Dr. Wende Neighbors, for further assessment and treatment of bipolar disorder and ADHD.  The patient is a vague historian and he tends to ramble a lot. Apparently as a child he was very unfocused and hyperactive. He is always had difficulty completing tasks. He was never diagnosed with ADHD in childhood and he was able to complete high school and 1 year of machine shop training. He has worked in numerous jobs in Film/video editor.  The patient drank heavily and used drugs like marijuana and  cocaine for quite a number of years. He quit all this about 10 years ago. About 7 years ago he became increasingly angry and moody and irritable. Some of it started when he and his wife are building a house and he felt like the contractor "screwed me out of $100,000.". He began seeing Dr. Candis Schatz in [place] and was diagnosed as being bipolar because he had significant mood swings anger and irritability. Since that he's been on a combination of Lamictal and Depakote which he thinks has helped. Because of his long-term difficulties with focus and concentration he was started on Vyvanse which he also thinks has helped.  The patient still has some difficulties with irritability but not like he used to. He and his wife don't get along but he admits that he was having an affair with an old girlfriend for the last few years and still talks to this woman periodically. His wife found out about it and is quite resentful. He also has significant memory problems and often loses his train of thought. He denies any head injuries. 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 He has numerous medical problems and has chronic pain from arthritis. Last year he also had knee surgery and is well as a small bowel obstruction.  The patient no longer uses drugs or alcohol. He is active in church and does a lot of things with his cousin. His energy is pretty good and he states he sleeps fairly well. He has never had psychiatric hospitalization or treatment  for drug or alcohol abuse  The patient returns for follow-up after 3 months.  He states he spending most of his time helping his grandsons with their virtual learning.  He has 1/5 grader in 1 first grader.  He is also enjoying playing golf and has bought a new truck.  He states that his mood has been good and he has not had significant anger or irritability spells.  The Vyvanse continues to help him focus and he feels that Lamictal has helped His mood  stable. Visit Diagnosis:    ICD-10-CM   1. Attention deficit hyperactivity disorder (ADHD), predominantly inattentive type  F90.0   2. Bipolar 1 disorder, mixed, moderate (HCC)  F31.62     Past Psychiatric History: Past outpatient treatment for bipolar disorder Past Medical History:  Past Medical History:  Diagnosis Date  . Acid reflux   . 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 Arthritis   . Asthma    ACTIVITY INDUCED  . Bipolar 1 disorder ([place])   . BPH (benign prostatic hyperplasia)   . Cellulitis 05/2014   right side face  . Cerebral aneurysm    has a stent  . GERD (gastroesophageal reflux disease)   . Neck pain, chronic   . Sinus drainage   . 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  . Venous insufficiency     Past Surgical History:  Procedure Laterality Date  . cerebral stent    . COLONOSCOPY N/A 01/02/2017   Procedure: COLONOSCOPY;  Surgeon: Rogene Houston, MD;  [facility];  Service: Endoscopy;  Laterality: N/A;  . dental implant  09/26/2014   left lower dental implant  . ESOPHAGOGASTRODUODENOSCOPY N/A 01/02/2017   Procedure: ESOPHAGOGASTRODUODENOSCOPY (EGD);  Surgeon: Rogene Houston, MD;  Location: [facility];  Service: Endoscopy;  Laterality: N/A;  910  . EYE SURGERY Left Jan. 16, 2017   Cataract  . IR ANGIO INTRA EXTRACRAN SEL INTERNAL CAROTID BILAT MOD SED  01/20/2017  . IR ANGIO VERTEBRAL SEL VERTEBRAL UNI L MOD SED  01/20/2017  . JOINT REPLACEMENT Right    Knee  . JOINT REPLACEMENT Left    Hip  . NOSE SURGERY    . RADIOLOGY WITH ANESTHESIA N/A 12/29/2014   Procedure: Embolization;  Surgeon: Consuella Lose, MD;  Location: [facility];  Service: Radiology;  Laterality: N/A;  . SUBCLAVIAN ANGIOGRAM  Nov. 8, 2016  . TOTAL HIP ARTHROPLASTY Left 01/03/2013   Procedure: TOTAL HIP ARTHROPLASTY ANTERIOR APPROACH;  Surgeon: Gearlean Alf, MD;  Location: [facility];  Service: Orthopedics;   Laterality: Left;  . TOTAL KNEE ARTHROPLASTY Right 10/06/2014   Procedure: RIGHT TOTAL KNEE ARTHROPLASTY;  Surgeon: Gearlean Alf, MD;  Location: [facility];  Service: Orthopedics;  Laterality: Right;    Family Psychiatric History: see below  Family History:  Family History  Problem Relation Age of Onset  . Arrhythmia Mother        Has pacemaker  . Hypertension Mother   . Heart disease Mother   . Mental illness Mother   . Varicose Veins Mother   . Depression Mother   . Colon cancer Father   . Cancer Father        Prostate and Colon  . Diabetes Father   . Hypertension Father   . Cancer - Colon Father   . Depression Sister     Social History:  Social History  Socioeconomic History  . Marital status: Married    Spouse name: Not on file  . Number of children: Not on file  . Years of education: Not on file  . Highest education level: Not on file  Occupational History  . Not on file  Social Needs  . Financial resource strain: Not on file  . Food insecurity    Worry: Not on file    Inability: Not on file  . Transportation needs    Medical: Not on file    Non-medical: Not on file  Tobacco Use  . Smoking status: Former Smoker    Packs/day: 0.50    Types: Cigarettes    Start date: 05/23/2015    Quit date: 10/09/2017    Years since quitting: 1.7  . Smokeless tobacco: Former Systems developer  . Tobacco comment: Stated "I smoke off and on"  Substance and Sexual Activity  . Alcohol use: No    Alcohol/week: 0.0 standard drinks    Comment: 10-12-15 per pt no  . Drug use: No    Comment: 10-12-15 per tp no  . Sexual activity: Yes    Partners: Female  Lifestyle  . Physical activity    Days per week: Not on file    Minutes per session: Not on file  . Stress: Not on file  Relationships  . Social Herbalist on phone: Not on file    Gets together: Not on file    Attends religious service: Not on file    Active member of club or organization: Not on file    Attends meetings of  clubs or organizations: Not on file    Relationship status: Not on file  Other Topics Concern  . Not on file  Social History Narrative  . Not on file    Allergies:  Allergies  Allergen Reactions  . Erythromycin     Caused a "twisted stomach"  . Penicillins Rash    Metabolic Disorder Labs: No results found for: HGBA1C, MPG No results found for: PROLACTIN No results found for: CHOL, TRIG, HDL, CHOLHDL, VLDL, LDLCALC No results found for: TSH  Therapeutic Level Labs: No results found for: LITHIUM No results found for: VALPROATE No components found for:  CBMZ  Current Medications: Current Outpatient Medications  Medication Sig Dispense Refill  . aspirin 325 MG tablet Take 325 mg by mouth daily.    . cyclobenzaprine (FLEXERIL) 5 MG tablet     . dexlansoprazole (DEXILANT) 60 MG capsule Take 60 mg by mouth every morning.     . Dutasteride-Tamsulosin HCl (JALYN) 0.5-0.4 MG CAPS Take 1 capsule by mouth daily at 12 noon.     . ferrous sulfate 325 (65 FE) MG tablet Take 325 mg by mouth daily with breakfast.    . HYDROcodone-acetaminophen (NORCO) 10-325 MG tablet Take 1 tablet by mouth every 6 (six) hours as needed for moderate pain.    Marland Kitchen ibuprofen (ADVIL,MOTRIN) 200 MG tablet Take 800 mg by mouth every 8 (eight) hours as needed for mild pain or moderate pain.    Marland Kitchen ipratropium (ATROVENT) 0.03 % nasal spray Place 2 sprays into both nostrils every 12 (twelve) hours as needed for rhinitis. Reported on 10/23/2015    . lamoTRIgine (LAMICTAL) 200 MG tablet Take 1 tablet (200 mg total) by mouth at bedtime. 30 tablet 2  . levalbuterol (XOPENEX HFA) 45 MCG/ACT inhaler Inhale 2 puffs into the lungs every 6 (six) hours as needed for wheezing.    Marland Kitchen levocetirizine (XYZAL)  5 MG tablet Take 1 tablet by mouth at bedtime.    Marland Kitchen lisdexamfetamine (VYVANSE) 70 MG capsule Take 1 capsule (70 mg total) by mouth daily. 30 capsule 0  . lisdexamfetamine (VYVANSE) 70 MG capsule Take 1 capsule (70 mg total) by mouth  daily. 30 capsule 0  . lisdexamfetamine (VYVANSE) 70 MG capsule Take 1 capsule (70 mg total) by mouth daily. 30 capsule 0  . losartan-hydrochlorothiazide (HYZAAR) 100-25 MG tablet     . montelukast (SINGULAIR) 10 MG tablet Take 10 mg by mouth every morning.     . Multiple Vitamins-Minerals (MULTIVITAMIN WITH MINERALS) tablet Take 1 tablet by mouth daily.    Marland Kitchen neomycin-polymyxin-hydrocortisone (CORTISPORIN) OTIC solution 1-2 drops to toe twice daily after soaking 10 mL 0  . neomycin-polymyxin-hydrocortisone (CORTISPORIN) OTIC solution Apply 1-2 drops to toe after soaking twice a day 10 mL 0  . Pediatric Multivitamins-Iron (FLINTSTONES PLUS IRON) chewable tablet Chew 1 tablet by mouth 2 (two) times daily.    . predniSONE (DELTASONE) 10 MG tablet     . PROAIR HFA 108 (90 Base) MCG/ACT inhaler     . promethazine (PHENERGAN) 25 MG tablet Take 25 mg by mouth every 6 (six) hours as needed for nausea or vomiting.    . ranitidine (ZANTAC) 150 MG capsule Take 150 mg by mouth daily as needed for heartburn.    . tadalafil (CIALIS) 20 MG tablet Take 20 mg by mouth daily as needed for erectile dysfunction.    . terbinafine (LAMISIL) 250 MG tablet Take 1 tablet (250 mg total) by mouth daily. 90 tablet 0  . testosterone cypionate (DEPOTESTOTERONE CYPIONATE) 200 MG/ML injection Inject 200 mg into the muscle every 14 (fourteen) days.      No current facility-administered medications for this visit.      Musculoskeletal: Strength & Muscle Tone: within normal limits Gait & Station: normal Patient leans: N/A  Psychiatric Specialty Exam: Review of Systems  Musculoskeletal: Positive for back pain.  All other systems reviewed and are negative.   There were no vitals taken for this visit.There is no height or weight on file to calculate BMI.  General Appearance: NA  Eye Contact:  NA  Speech:  Clear and Coherent  Volume:  Normal  Mood:  Euthymic  Affect:  NA  Thought Process:  Goal Directed  Orientation:   Full (Time, Place, and Person)  Thought Content: WDL   Suicidal Thoughts:  No  Homicidal Thoughts:  No  Memory:  Immediate;   Good Recent;   Good Remote;   Fair  Judgement:  Good  Insight:  Fair  Psychomotor Activity:  Normal  Concentration:  Concentration: Good and Attention Span: Good  Recall:  Good  Fund of Knowledge: Fair  Language: Good  Akathisia:  No  Handed:  Right  AIMS (if indicated): not done  Assets:  Communication Skills Desire for Improvement Resilience Social Support Talents/Skills  ADL's:  Intact  Cognition: WNL  Sleep:  Good   Screenings:   Assessment and Plan: This patient is a 63 year old male with a history of bipolar disorder and ADHD.  He continues to do well on his current medications.  He will continue Lamictal 200 mg at bedtime for mood stabilization and Vyvanse 70 mg every morning for focus.  He will return to see me in 3 months   [name], MD 07/20/2019, 10:57 AM

## 2019-07-21 ENCOUNTER — Ambulatory Visit (HOSPITAL_COMMUNITY): Payer: PPO | Admitting: Psychiatry

## 2019-07-21 DIAGNOSIS — R7301 Impaired fasting glucose: Secondary | ICD-10-CM | POA: Diagnosis not present

## 2019-07-21 DIAGNOSIS — J441 Chronic obstructive pulmonary disease with (acute) exacerbation: Secondary | ICD-10-CM | POA: Diagnosis not present

## 2019-07-21 DIAGNOSIS — E782 Mixed hyperlipidemia: Secondary | ICD-10-CM | POA: Diagnosis not present

## 2019-07-21 DIAGNOSIS — D649 Anemia, unspecified: Secondary | ICD-10-CM | POA: Diagnosis not present

## 2019-07-21 DIAGNOSIS — F319 Bipolar disorder, unspecified: Secondary | ICD-10-CM | POA: Diagnosis not present

## 2019-07-21 DIAGNOSIS — I10 Essential (primary) hypertension: Secondary | ICD-10-CM | POA: Diagnosis not present

## 2019-07-21 DIAGNOSIS — J449 Chronic obstructive pulmonary disease, unspecified: Secondary | ICD-10-CM | POA: Diagnosis not present

## 2019-07-21 DIAGNOSIS — R030 Elevated blood-pressure reading, without diagnosis of hypertension: Secondary | ICD-10-CM | POA: Diagnosis not present

## 2019-07-21 DIAGNOSIS — K219 Gastro-esophageal reflux disease without esophagitis: Secondary | ICD-10-CM | POA: Diagnosis not present

## 2019-08-24 DIAGNOSIS — L812 Freckles: Secondary | ICD-10-CM | POA: Diagnosis not present

## 2019-08-24 DIAGNOSIS — L853 Xerosis cutis: Secondary | ICD-10-CM | POA: Diagnosis not present

## 2019-08-24 DIAGNOSIS — I8392 Asymptomatic varicose veins of left lower extremity: Secondary | ICD-10-CM | POA: Diagnosis not present

## 2019-08-24 DIAGNOSIS — L821 Other seborrheic keratosis: Secondary | ICD-10-CM | POA: Diagnosis not present

## 2019-08-24 DIAGNOSIS — I8391 Asymptomatic varicose veins of right lower extremity: Secondary | ICD-10-CM | POA: Diagnosis not present

## 2019-08-24 DIAGNOSIS — L570 Actinic keratosis: Secondary | ICD-10-CM | POA: Diagnosis not present

## 2019-08-24 DIAGNOSIS — D225 Melanocytic nevi of trunk: Secondary | ICD-10-CM | POA: Diagnosis not present

## 2019-08-24 DIAGNOSIS — Z85828 Personal history of other malignant neoplasm of skin: Secondary | ICD-10-CM | POA: Diagnosis not present

## 2019-09-08 DIAGNOSIS — J029 Acute pharyngitis, unspecified: Secondary | ICD-10-CM | POA: Diagnosis not present

## 2019-09-12 DIAGNOSIS — M79645 Pain in left finger(s): Secondary | ICD-10-CM | POA: Diagnosis not present

## 2019-09-12 DIAGNOSIS — M1812 Unilateral primary osteoarthritis of first carpometacarpal joint, left hand: Secondary | ICD-10-CM | POA: Diagnosis not present

## 2019-09-12 DIAGNOSIS — M25512 Pain in left shoulder: Secondary | ICD-10-CM | POA: Diagnosis not present

## 2019-09-12 DIAGNOSIS — M25511 Pain in right shoulder: Secondary | ICD-10-CM | POA: Diagnosis not present

## 2019-09-12 DIAGNOSIS — M189 Osteoarthritis of first carpometacarpal joint, unspecified: Secondary | ICD-10-CM | POA: Insufficient documentation

## 2019-09-12 DIAGNOSIS — M25542 Pain in joints of left hand: Secondary | ICD-10-CM | POA: Insufficient documentation

## 2019-09-12 DIAGNOSIS — M25522 Pain in left elbow: Secondary | ICD-10-CM | POA: Insufficient documentation

## 2019-09-17 DIAGNOSIS — M25522 Pain in left elbow: Secondary | ICD-10-CM | POA: Diagnosis not present

## 2019-09-21 DIAGNOSIS — S46212A Strain of muscle, fascia and tendon of other parts of biceps, left arm, initial encounter: Secondary | ICD-10-CM | POA: Diagnosis not present

## 2019-09-21 DIAGNOSIS — M1812 Unilateral primary osteoarthritis of first carpometacarpal joint, left hand: Secondary | ICD-10-CM | POA: Diagnosis not present

## 2019-09-21 DIAGNOSIS — M79645 Pain in left finger(s): Secondary | ICD-10-CM | POA: Diagnosis not present

## 2019-09-21 DIAGNOSIS — M25522 Pain in left elbow: Secondary | ICD-10-CM | POA: Diagnosis not present

## 2019-09-23 DIAGNOSIS — J441 Chronic obstructive pulmonary disease with (acute) exacerbation: Secondary | ICD-10-CM | POA: Diagnosis not present

## 2019-09-23 DIAGNOSIS — I10 Essential (primary) hypertension: Secondary | ICD-10-CM | POA: Diagnosis not present

## 2019-09-23 DIAGNOSIS — D649 Anemia, unspecified: Secondary | ICD-10-CM | POA: Diagnosis not present

## 2019-09-23 DIAGNOSIS — J449 Chronic obstructive pulmonary disease, unspecified: Secondary | ICD-10-CM | POA: Diagnosis not present

## 2019-09-23 DIAGNOSIS — E7849 Other hyperlipidemia: Secondary | ICD-10-CM | POA: Diagnosis not present

## 2019-09-23 DIAGNOSIS — R030 Elevated blood-pressure reading, without diagnosis of hypertension: Secondary | ICD-10-CM | POA: Diagnosis not present

## 2019-09-23 DIAGNOSIS — K219 Gastro-esophageal reflux disease without esophagitis: Secondary | ICD-10-CM | POA: Diagnosis not present

## 2019-09-23 DIAGNOSIS — F319 Bipolar disorder, unspecified: Secondary | ICD-10-CM | POA: Diagnosis not present

## 2019-09-23 DIAGNOSIS — R7301 Impaired fasting glucose: Secondary | ICD-10-CM | POA: Diagnosis not present

## 2019-10-14 DIAGNOSIS — E291 Testicular hypofunction: Secondary | ICD-10-CM | POA: Diagnosis not present

## 2019-10-14 DIAGNOSIS — R05 Cough: Secondary | ICD-10-CM | POA: Diagnosis not present

## 2019-10-14 DIAGNOSIS — I10 Essential (primary) hypertension: Secondary | ICD-10-CM | POA: Diagnosis not present

## 2019-10-14 DIAGNOSIS — R7301 Impaired fasting glucose: Secondary | ICD-10-CM | POA: Diagnosis not present

## 2019-10-14 DIAGNOSIS — E782 Mixed hyperlipidemia: Secondary | ICD-10-CM | POA: Diagnosis not present

## 2019-10-14 DIAGNOSIS — R030 Elevated blood-pressure reading, without diagnosis of hypertension: Secondary | ICD-10-CM | POA: Diagnosis not present

## 2019-10-14 DIAGNOSIS — D649 Anemia, unspecified: Secondary | ICD-10-CM | POA: Diagnosis not present

## 2019-10-14 DIAGNOSIS — D291 Benign neoplasm of prostate: Secondary | ICD-10-CM | POA: Diagnosis not present

## 2019-10-18 DIAGNOSIS — Z79891 Long term (current) use of opiate analgesic: Secondary | ICD-10-CM | POA: Diagnosis not present

## 2019-10-18 DIAGNOSIS — I720 Aneurysm of carotid artery: Secondary | ICD-10-CM | POA: Diagnosis not present

## 2019-10-18 DIAGNOSIS — J302 Other seasonal allergic rhinitis: Secondary | ICD-10-CM | POA: Diagnosis not present

## 2019-10-18 DIAGNOSIS — F319 Bipolar disorder, unspecified: Secondary | ICD-10-CM | POA: Diagnosis not present

## 2019-10-18 DIAGNOSIS — M170 Bilateral primary osteoarthritis of knee: Secondary | ICD-10-CM | POA: Diagnosis not present

## 2019-10-18 DIAGNOSIS — J449 Chronic obstructive pulmonary disease, unspecified: Secondary | ICD-10-CM | POA: Diagnosis not present

## 2019-10-18 DIAGNOSIS — K219 Gastro-esophageal reflux disease without esophagitis: Secondary | ICD-10-CM | POA: Diagnosis not present

## 2019-10-18 DIAGNOSIS — G8929 Other chronic pain: Secondary | ICD-10-CM | POA: Diagnosis not present

## 2019-10-18 DIAGNOSIS — G629 Polyneuropathy, unspecified: Secondary | ICD-10-CM | POA: Diagnosis not present

## 2019-10-18 DIAGNOSIS — I10 Essential (primary) hypertension: Secondary | ICD-10-CM | POA: Diagnosis not present

## 2019-10-18 DIAGNOSIS — N400 Enlarged prostate without lower urinary tract symptoms: Secondary | ICD-10-CM | POA: Diagnosis not present

## 2019-10-18 DIAGNOSIS — E291 Testicular hypofunction: Secondary | ICD-10-CM | POA: Diagnosis not present

## 2019-10-19 ENCOUNTER — Other Ambulatory Visit (HOSPITAL_COMMUNITY): Payer: Self-pay | Admitting: Psychiatry

## 2019-10-19 ENCOUNTER — Telehealth (HOSPITAL_COMMUNITY): Payer: Self-pay | Admitting: *Deleted

## 2019-10-19 MED ORDER — LAMOTRIGINE 200 MG PO TABS: 200.0000 mg | ORAL_TABLET | Freq: Every day | ORAL | 2 refills | Status: DC

## 2019-10-19 MED ORDER — LISDEXAMFETAMINE DIMESYLATE 70 MG PO CAPS: 70.0000 mg | ORAL_CAPSULE | Freq: Every day | ORAL | 0 refills | Status: DC

## 2019-10-19 NOTE — Telephone Encounter (Signed)
sent 

## 2019-10-19 NOTE — Telephone Encounter (Signed)
PATIENT CALLED REQUESTING REFILL VYVANSE & LAMICTAL NEXT APPT 10/21/19 @ 9:20 AM. PER Rx O REFILLS

## 2019-10-20 ENCOUNTER — Other Ambulatory Visit (HOSPITAL_COMMUNITY): Payer: Self-pay | Admitting: Psychiatry

## 2019-10-20 MED ORDER — LISDEXAMFETAMINE DIMESYLATE 70 MG PO CAPS: 70.0000 mg | ORAL_CAPSULE | Freq: Every day | ORAL | 0 refills | Status: DC

## 2019-10-21 ENCOUNTER — Other Ambulatory Visit: Payer: Self-pay

## 2019-10-21 ENCOUNTER — Ambulatory Visit (INDEPENDENT_AMBULATORY_CARE_PROVIDER_SITE_OTHER): Payer: PPO | Admitting: Psychiatry

## 2019-10-21 ENCOUNTER — Encounter (HOSPITAL_COMMUNITY): Payer: Self-pay | Admitting: Psychiatry

## 2019-10-21 DIAGNOSIS — F3162 Bipolar disorder, current episode mixed, moderate: Secondary | ICD-10-CM

## 2019-10-21 DIAGNOSIS — F900 Attention-deficit hyperactivity disorder, predominantly inattentive type: Secondary | ICD-10-CM

## 2019-10-21 MED ORDER — LAMOTRIGINE 200 MG PO TABS: 200.0000 mg | ORAL_TABLET | Freq: Every day | ORAL | 2 refills | Status: DC

## 2019-10-21 MED ORDER — LISDEXAMFETAMINE DIMESYLATE 70 MG PO CAPS: 70.0000 mg | ORAL_CAPSULE | Freq: Every day | ORAL | 0 refills | Status: DC

## 2019-10-21 NOTE — Progress Notes (Signed)
Virtual Visit via Telephone Note  I connected with Philip Gates on 10/21/19 at  9:20 AM EST by telephone and verified that I am speaking with the correct person using two identifiers.   I discussed the limitations, risks, security and privacy concerns of performing an evaluation and management service by telephone and the availability of in person appointments. I also discussed with the patient that there may be a patient responsible charge related to this service. The patient expressed understanding and agreed to proceed.    I discussed the assessment and treatment plan with the patient. The patient was provided an opportunity to ask questions and all were answered. The patient agreed with the plan and demonstrated an understanding of the instructions.   The patient was advised to call back or seek an in-person evaluation if the symptoms worsen or if the condition fails to improve as anticipated.  I provided 15 minutes of non-face-to-face time during this encounter.   [name], MD  [facility] MD/PA/NP OP Progress Note  10/21/2019 9:42 AM Philip Gates  MRN:  [id number]  Chief Complaint:  Chief Complaint    ADHD; Manic Behavior; Follow-up     HPI: This patient is a64 year old married white male lives with his wife in [place]. 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  The patient was referred by his primary physician, Dr. Wende Neighbors, for further assessment and treatment of bipolar disorder and ADHD.  The patient is a vague historian and he tends to ramble a lot. Apparently as a child he was very unfocused and hyperactive. He is always had difficulty completing tasks. He was never diagnosed with ADHD in childhood and he was able to complete high school and 1 year of machine shop training. He has worked in numerous jobs in Film/video editor.  The patient drank heavily and used drugs like marijuana and  cocaine for quite a number of years. He quit all this about 10 years ago. About 7 years ago he became increasingly angry and moody and irritable. Some of it started when he and his wife are building a house and he felt like the contractor "screwed me out of $100,000.". He began seeing Dr. Candis Schatz in [place] and was diagnosed as being bipolar because he had significant mood swings anger and irritability. Since that he's been on a combination of Lamictal and Depakote which he thinks has helped. Because of his long-term difficulties with focus and concentration he was started on Vyvanse which he also thinks has helped.  The patient still has some difficulties with irritability but not like he used to. He and his wife don't get along but he admits that he was having an affair with an old girlfriend for the last few years and still talks to this woman periodically. His wife found out about it and is quite resentful. He also has significant memory problems and often loses his train of thought. He denies any head injuries. 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 He has numerous medical problems and has chronic pain from arthritis. Last year he also had knee surgery and is well as a small bowel obstruction.  The patient no longer uses drugs or alcohol. He is active in church and does a lot of things with his cousin. His energy is pretty good and he states he sleeps fairly well. He has never had psychiatric hospitalization or treatment  for drug or alcohol abuse  The patient returns for follow-up after 3 months.  He states he has been doing okay for the most part but has been stressed about his mother's situation.  She has been living on her own and has had several falls with current hospitalizations for congestive heart failure.  She is now in a temporary nursing home facility and is slated to come back home soon.  He is worried about providing home health care for her.  For the most part  however he states his mood is been stable he has not had significant mood swings or anger spells.  The Vyvanse continues to help with his focus and he is not depressed or having any thoughts of self-harm. Visit Diagnosis:    ICD-10-CM   1. Attention deficit hyperactivity disorder (ADHD), predominantly inattentive type  F90.0   2. Bipolar 1 disorder, mixed, moderate (HCC)  F31.62     Past Psychiatric History: Past outpatient treatment for bipolar disorder  Past Medical History:  Past Medical History:  Diagnosis Date  . Acid reflux   . 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 Arthritis   . Asthma    ACTIVITY INDUCED  . Bipolar 1 disorder ([place])   . BPH (benign prostatic hyperplasia)   . Cellulitis 05/2014   right side face  . Cerebral aneurysm    has a stent  . GERD (gastroesophageal reflux disease)   . Neck pain, chronic   . Sinus drainage   . 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  . Venous insufficiency     Past Surgical History:  Procedure Laterality Date  . cerebral stent    . COLONOSCOPY N/A 01/02/2017   Procedure: COLONOSCOPY;  Surgeon: Rogene Houston, MD;  [facility];  Service: Endoscopy;  Laterality: N/A;  . dental implant  09/26/2014   left lower dental implant  . ESOPHAGOGASTRODUODENOSCOPY N/A 01/02/2017   Procedure: ESOPHAGOGASTRODUODENOSCOPY (EGD);  Surgeon: Rogene Houston, MD;  [facility]: [facility];  Service: Endoscopy;  Laterality: N/A;  910  . EYE SURGERY Left Jan. 16, 2017   Cataract  . IR ANGIO INTRA EXTRACRAN SEL INTERNAL CAROTID BILAT MOD SED  01/20/2017  . IR ANGIO VERTEBRAL SEL VERTEBRAL UNI L MOD SED  01/20/2017  . JOINT REPLACEMENT Right    Knee  . JOINT REPLACEMENT Left    Hip  . NOSE SURGERY    . RADIOLOGY WITH ANESTHESIA N/A 12/29/2014   Procedure: Embolization;  Surgeon: Consuella Lose, MD;  Location: [facility];  Service: Radiology;  Laterality: N/A;  .  SUBCLAVIAN ANGIOGRAM  Nov. 8, 2016  . TOTAL HIP ARTHROPLASTY Left 01/03/2013   Procedure: TOTAL HIP ARTHROPLASTY ANTERIOR APPROACH;  Surgeon: Gearlean Alf, MD;  Location: [facility];  Service: Orthopedics;  Laterality: Left;  . TOTAL KNEE ARTHROPLASTY Right 10/06/2014   Procedure: RIGHT TOTAL KNEE ARTHROPLASTY;  Surgeon: Gearlean Alf, MD;  Location: [facility];  Service: Orthopedics;  Laterality: Right;    Family Psychiatric History: see below  Family History:  Family History  Problem Relation Age of Onset  . Arrhythmia Mother        Has pacemaker  . Hypertension Mother   . Heart disease Mother   . Mental illness Mother   . Varicose Veins Mother   . Depression Mother   . Colon cancer Father   . Cancer Father  Prostate and Colon  . Diabetes Father   . Hypertension Father   . Cancer - Colon Father   . Depression Sister     Social History:  Social History   Socioeconomic History  . Marital status: Married    Spouse name: Not on file  . Number of children: Not on file  . Years of education: Not on file  . Highest education level: Not on file  Occupational History  . Not on file  Tobacco Use  . Smoking status: Former Smoker    Packs/day: 0.50    Types: Cigarettes    Start date: 05/23/2015    Quit date: 10/09/2017    Years since quitting: 2.0  . Smokeless tobacco: Former Systems developer  . Tobacco comment: Stated "I smoke off and on"  Substance and Sexual Activity  . Alcohol use: No    Alcohol/week: 0.0 standard drinks    Comment: 10-12-15 per pt no  . Drug use: No    Comment: 10-12-15 per tp no  . Sexual activity: Yes    Partners: Female  Other Topics Concern  . Not on file  Social History Narrative  . Not on file   Social Determinants of Health   Financial Resource Strain:   . Difficulty of Paying Living Expenses: Not on file  Food Insecurity:   . Worried About Charity fundraiser in the Last Year: Not on file  . Ran Out of Food in the Last Year: Not on file   Transportation Needs:   . Lack of Transportation (Medical): Not on file  . Lack of Transportation (Non-Medical): Not on file  Physical Activity:   . Days of Exercise per Week: Not on file  . Minutes of Exercise per Session: Not on file  Stress:   . Feeling of Stress : Not on file  Social Connections:   . Frequency of Communication with Friends and Family: Not on file  . Frequency of Social Gatherings with Friends and Family: Not on file  . Attends Religious Services: Not on file  . Active Member of Clubs or Organizations: Not on file  . Attends Archivist Meetings: Not on file  . Marital Status: Not on file    Allergies:  Allergies  Allergen Reactions  . Erythromycin     Caused a "twisted stomach"  . Penicillins Rash    Metabolic Disorder Labs: No results found for: HGBA1C, MPG No results found for: PROLACTIN No results found for: CHOL, TRIG, HDL, CHOLHDL, VLDL, LDLCALC No results found for: TSH  Therapeutic Level Labs: No results found for: LITHIUM No results found for: VALPROATE No components found for:  CBMZ  Current Medications: Current Outpatient Medications  Medication Sig Dispense Refill  . aspirin 325 MG tablet Take 325 mg by mouth daily.    . cyclobenzaprine (FLEXERIL) 5 MG tablet     . dexlansoprazole (DEXILANT) 60 MG capsule Take 60 mg by mouth every morning.     . Dutasteride-Tamsulosin HCl (JALYN) 0.5-0.4 MG CAPS Take 1 capsule by mouth daily at 12 noon.     . ferrous sulfate 325 (65 FE) MG tablet Take 325 mg by mouth daily with breakfast.    . HYDROcodone-acetaminophen (NORCO) 10-325 MG tablet Take 1 tablet by mouth every 6 (six) hours as needed for moderate pain.    Marland Kitchen ibuprofen (ADVIL,MOTRIN) 200 MG tablet Take 800 mg by mouth every 8 (eight) hours as needed for mild pain or moderate pain.    Marland Kitchen ipratropium (ATROVENT)  0.03 % nasal spray Place 2 sprays into both nostrils every 12 (twelve) hours as needed for rhinitis. Reported on 10/23/2015    .  lamoTRIgine (LAMICTAL) 200 MG tablet Take 1 tablet (200 mg total) by mouth at bedtime. 30 tablet 2  . levalbuterol (XOPENEX HFA) 45 MCG/ACT inhaler Inhale 2 puffs into the lungs every 6 (six) hours as needed for wheezing.    Marland Kitchen levocetirizine (XYZAL) 5 MG tablet Take 1 tablet by mouth at bedtime.    Marland Kitchen lisdexamfetamine (VYVANSE) 70 MG capsule Take 1 capsule (70 mg total) by mouth daily. 30 capsule 0  . lisdexamfetamine (VYVANSE) 70 MG capsule Take 1 capsule (70 mg total) by mouth daily. 30 capsule 0  . lisdexamfetamine (VYVANSE) 70 MG capsule Take 1 capsule (70 mg total) by mouth daily. 30 capsule 0  . losartan-hydrochlorothiazide (HYZAAR) 100-25 MG tablet     . montelukast (SINGULAIR) 10 MG tablet Take 10 mg by mouth every morning.     . Multiple Vitamins-Minerals (MULTIVITAMIN WITH MINERALS) tablet Take 1 tablet by mouth daily.    Marland Kitchen neomycin-polymyxin-hydrocortisone (CORTISPORIN) OTIC solution 1-2 drops to toe twice daily after soaking 10 mL 0  . neomycin-polymyxin-hydrocortisone (CORTISPORIN) OTIC solution Apply 1-2 drops to toe after soaking twice a day 10 mL 0  . Pediatric Multivitamins-Iron (FLINTSTONES PLUS IRON) chewable tablet Chew 1 tablet by mouth 2 (two) times daily.    . predniSONE (DELTASONE) 10 MG tablet     . PROAIR HFA 108 (90 Base) MCG/ACT inhaler     . promethazine (PHENERGAN) 25 MG tablet Take 25 mg by mouth every 6 (six) hours as needed for nausea or vomiting.    . ranitidine (ZANTAC) 150 MG capsule Take 150 mg by mouth daily as needed for heartburn.    . tadalafil (CIALIS) 20 MG tablet Take 20 mg by mouth daily as needed for erectile dysfunction.    . terbinafine (LAMISIL) 250 MG tablet Take 1 tablet (250 mg total) by mouth daily. 90 tablet 0  . testosterone cypionate (DEPOTESTOTERONE CYPIONATE) 200 MG/ML injection Inject 200 mg into the muscle every 14 (fourteen) days.      No current facility-administered medications for this visit.     Musculoskeletal: Strength &  Muscle Tone: within normal limits Gait & Station: normal Patient leans: N/A  Psychiatric Specialty Exam: Review of Systems  All other systems reviewed and are negative.   There were no vitals taken for this visit.There is no height or weight on file to calculate BMI.  General Appearance: NA  Eye Contact:  NA  Speech:  Clear and Coherent  Volume:  Normal  Mood:  Anxious  Affect:  NA  Thought Process:  Goal Directed  Orientation:  Full (Time, Place, and Person)  Thought Content: Rumination   Suicidal Thoughts:  No  Homicidal Thoughts:  No  Memory:  Immediate;   Good Recent;   Good Remote;   Fair  Judgement:  Good  Insight:  Fair  Psychomotor Activity:  Normal  Concentration:  Concentration: Good and Attention Span: Good  Recall:  Good  Fund of Knowledge: Good  Language: Good  Akathisia:  No  Handed:  Right  AIMS (if indicated): not done  Assets:  Communication Skills Desire for Improvement Resilience Social Support Talents/Skills  ADL's:  Intact  Cognition: WNL  Sleep:  Good   Screenings:   Assessment and Plan: Patient is a 64 year old male with a history of bipolar and ADHD.  He is stressed and worried about his  mother but denies being depressed.  He will continue Lamictal 200 mg at bedtime for mood stabilization and Vyvanse 70 mg each morning for focus.  He will return to see me in 3 months   [name], MD 10/21/2019, 9:42 AM

## 2019-10-26 DIAGNOSIS — R030 Elevated blood-pressure reading, without diagnosis of hypertension: Secondary | ICD-10-CM | POA: Diagnosis not present

## 2019-10-26 DIAGNOSIS — D649 Anemia, unspecified: Secondary | ICD-10-CM | POA: Diagnosis not present

## 2019-10-26 DIAGNOSIS — J441 Chronic obstructive pulmonary disease with (acute) exacerbation: Secondary | ICD-10-CM | POA: Diagnosis not present

## 2019-10-26 DIAGNOSIS — F319 Bipolar disorder, unspecified: Secondary | ICD-10-CM | POA: Diagnosis not present

## 2019-10-26 DIAGNOSIS — J449 Chronic obstructive pulmonary disease, unspecified: Secondary | ICD-10-CM | POA: Diagnosis not present

## 2019-10-26 DIAGNOSIS — K219 Gastro-esophageal reflux disease without esophagitis: Secondary | ICD-10-CM | POA: Diagnosis not present

## 2019-10-26 DIAGNOSIS — R7301 Impaired fasting glucose: Secondary | ICD-10-CM | POA: Diagnosis not present

## 2019-10-26 DIAGNOSIS — E7849 Other hyperlipidemia: Secondary | ICD-10-CM | POA: Diagnosis not present

## 2019-10-26 DIAGNOSIS — I10 Essential (primary) hypertension: Secondary | ICD-10-CM | POA: Diagnosis not present

## 2019-11-16 DIAGNOSIS — F319 Bipolar disorder, unspecified: Secondary | ICD-10-CM | POA: Diagnosis not present

## 2019-11-16 DIAGNOSIS — J441 Chronic obstructive pulmonary disease with (acute) exacerbation: Secondary | ICD-10-CM | POA: Diagnosis not present

## 2019-11-16 DIAGNOSIS — J449 Chronic obstructive pulmonary disease, unspecified: Secondary | ICD-10-CM | POA: Diagnosis not present

## 2019-11-16 DIAGNOSIS — I10 Essential (primary) hypertension: Secondary | ICD-10-CM | POA: Diagnosis not present

## 2019-11-16 DIAGNOSIS — R030 Elevated blood-pressure reading, without diagnosis of hypertension: Secondary | ICD-10-CM | POA: Diagnosis not present

## 2019-11-16 DIAGNOSIS — K219 Gastro-esophageal reflux disease without esophagitis: Secondary | ICD-10-CM | POA: Diagnosis not present

## 2019-11-16 DIAGNOSIS — E7849 Other hyperlipidemia: Secondary | ICD-10-CM | POA: Diagnosis not present

## 2019-11-16 DIAGNOSIS — D649 Anemia, unspecified: Secondary | ICD-10-CM | POA: Diagnosis not present

## 2019-11-16 DIAGNOSIS — R7301 Impaired fasting glucose: Secondary | ICD-10-CM | POA: Diagnosis not present

## 2019-12-10 DIAGNOSIS — R7301 Impaired fasting glucose: Secondary | ICD-10-CM | POA: Diagnosis not present

## 2019-12-10 DIAGNOSIS — K219 Gastro-esophageal reflux disease without esophagitis: Secondary | ICD-10-CM | POA: Diagnosis not present

## 2019-12-10 DIAGNOSIS — F319 Bipolar disorder, unspecified: Secondary | ICD-10-CM | POA: Diagnosis not present

## 2019-12-10 DIAGNOSIS — J441 Chronic obstructive pulmonary disease with (acute) exacerbation: Secondary | ICD-10-CM | POA: Diagnosis not present

## 2019-12-10 DIAGNOSIS — I10 Essential (primary) hypertension: Secondary | ICD-10-CM | POA: Diagnosis not present

## 2019-12-10 DIAGNOSIS — J449 Chronic obstructive pulmonary disease, unspecified: Secondary | ICD-10-CM | POA: Diagnosis not present

## 2019-12-10 DIAGNOSIS — R030 Elevated blood-pressure reading, without diagnosis of hypertension: Secondary | ICD-10-CM | POA: Diagnosis not present

## 2019-12-10 DIAGNOSIS — E7849 Other hyperlipidemia: Secondary | ICD-10-CM | POA: Diagnosis not present

## 2019-12-10 DIAGNOSIS — D649 Anemia, unspecified: Secondary | ICD-10-CM | POA: Diagnosis not present

## 2020-01-09 ENCOUNTER — Telehealth (HOSPITAL_COMMUNITY): Payer: PPO | Admitting: Psychiatry

## 2020-01-09 ENCOUNTER — Other Ambulatory Visit: Payer: Self-pay

## 2020-01-11 ENCOUNTER — Ambulatory Visit (HOSPITAL_COMMUNITY): Payer: PPO | Admitting: Psychiatry

## 2020-01-18 ENCOUNTER — Telehealth (INDEPENDENT_AMBULATORY_CARE_PROVIDER_SITE_OTHER): Payer: PPO | Admitting: Psychiatry

## 2020-01-18 ENCOUNTER — Encounter (HOSPITAL_COMMUNITY): Payer: Self-pay | Admitting: Psychiatry

## 2020-01-18 ENCOUNTER — Other Ambulatory Visit: Payer: Self-pay

## 2020-01-18 DIAGNOSIS — F900 Attention-deficit hyperactivity disorder, predominantly inattentive type: Secondary | ICD-10-CM | POA: Diagnosis not present

## 2020-01-18 DIAGNOSIS — F3162 Bipolar disorder, current episode mixed, moderate: Secondary | ICD-10-CM

## 2020-01-18 MED ORDER — LAMOTRIGINE 200 MG PO TABS: 200.0000 mg | ORAL_TABLET | Freq: Every day | ORAL | 2 refills | Status: DC

## 2020-01-18 MED ORDER — LISDEXAMFETAMINE DIMESYLATE 70 MG PO CAPS: 70.0000 mg | ORAL_CAPSULE | Freq: Every day | ORAL | 0 refills | Status: DC

## 2020-01-18 NOTE — Progress Notes (Signed)
Virtual Visit via Telephone Note  I connected with Philip Gates on 01/18/20 at 11:20 AM EDT by telephone and verified that I am speaking with the correct person using two identifiers.   I discussed the limitations, risks, security and privacy concerns of performing an evaluation and management service by telephone and the availability of in person appointments. I also discussed with the patient that there may be a patient responsible charge related to this service. The patient expressed understanding and agreed to proceed.    I discussed the assessment and treatment plan with the patient. The patient was provided an opportunity to ask questions and all were answered. The patient agreed with the plan and demonstrated an understanding of the instructions.   The patient was advised to call back or seek an in-person evaluation if the symptoms worsen or if the condition fails to improve as anticipated.  I provided 15 minutes of non-face-to-face time during this encounter.   [name], MD  [facility] MD/PA/NP OP Progress Note  01/18/2020 11:20 AM Philip Gates  MRN:  [id number]  Chief Complaint:  Chief Complaint    ADHD; Manic Behavior; Follow-up     HPI: This patient is a64 year old married white male lives with his wife in [place]. He used to work as a Consulting civil engineer for a [employer] but has been retired for about one year on disability.  The patient was referred by his primary physician, Dr. Wende Neighbors, for further assessment and treatment of bipolar disorder and ADHD.  The patient is a vague historian and he tends to ramble a lot. Apparently as a child he was very unfocused and hyperactive. He is always had difficulty completing tasks. He was never diagnosed with ADHD in childhood and he was able to complete high school and 1 year of machine shop training. He has worked in numerous jobs in Film/video editor.  The patient drank heavily and used drugs like marijuana and  cocaine for quite a number of years. He quit all this about 10 years ago. About 7 years ago he became increasingly angry and moody and irritable. Some of it started when he and his wife are building a house and he felt like the contractor "screwed me out of $100,000.". He began seeing Dr. Candis Schatz in [place] and was diagnosed as being bipolar because he had significant mood swings anger and irritability. Since that he's been on a combination of Lamictal and Depakote which he thinks has helped. Because of his long-term difficulties with focus and concentration he was started on Vyvanse which he also thinks has helped.  The patient still has some difficulties with irritability but not like he used to. He and his wife don't get along but he admits that he was having an affair with an old girlfriend for the last few years and still talks to this woman periodically. His wife found out about it and is quite resentful. He also has significant memory problems and often loses his train of thought. He denies any head injuries. 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 He has numerous medical problems and has chronic pain from arthritis. Last year he also had knee surgery and is well as a small bowel obstruction.  The patient no longer uses drugs or alcohol. He is active in church and does a lot of things with his cousin. His energy is pretty good and he states he sleeps fairly well. He has never had psychiatric hospitalization or treatment for  drug or alcohol abuse  The patient returns for follow-up after 3 months.  He states that his mother died on 11/12/2022.  She had been declining over the last year or 2 and had been having a lot of falls.  She was in a nursing care facility so he was unable to see her as much towards the end.  They were very close and he still misses her a great deal.  He feels sad but denies serious depression or suicidal ideation.  He is staying active with his children and  grandchildren and is coaching baseball.  He is sleeping well.  He denies outbursts or temper issues .  His focus is good. Visit Diagnosis:    ICD-10-CM   1. Attention deficit hyperactivity disorder (ADHD), predominantly inattentive type  F90.0   2. Bipolar 1 disorder, mixed, moderate (HCC)  F31.62     Past Psychiatric History: Past outpatient treatment for bipolar disorder  Past Medical History:  Past Medical History:  Diagnosis Date  . Acid reflux   . 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 Arthritis   . Asthma    ACTIVITY INDUCED  . Bipolar 1 disorder ([place])   . BPH (benign prostatic hyperplasia)   . Cellulitis 05/2014   right side face  . Cerebral aneurysm    has a stent  . GERD (gastroesophageal reflux disease)   . Neck pain, chronic   . Sinus drainage   . 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  . Venous insufficiency     Past Surgical History:  Procedure Laterality Date  . cerebral stent    . COLONOSCOPY N/A 01/02/2017   Procedure: COLONOSCOPY;  Surgeon: Rogene Houston, MD;  [facility];  Service: Endoscopy;  Laterality: N/A;  . dental implant  09/26/2014   left lower dental implant  . ESOPHAGOGASTRODUODENOSCOPY N/A 01/02/2017   Procedure: ESOPHAGOGASTRODUODENOSCOPY (EGD);  Surgeon: Rogene Houston, MD;  Location: [facility];  Service: Endoscopy;  Laterality: N/A;  910  . EYE SURGERY Left Jan. 16, 2017   Cataract  . IR ANGIO INTRA EXTRACRAN SEL INTERNAL CAROTID BILAT MOD SED  01/20/2017  . IR ANGIO VERTEBRAL SEL VERTEBRAL UNI L MOD SED  01/20/2017  . JOINT REPLACEMENT Right    Knee  . JOINT REPLACEMENT Left    Hip  . NOSE SURGERY    . RADIOLOGY WITH ANESTHESIA N/A 12/29/2014   Procedure: Embolization;  Surgeon: Consuella Lose, MD;  Location: [facility];  Service: Radiology;  Laterality: N/A;  . SUBCLAVIAN ANGIOGRAM  Nov. 8, 2016  . TOTAL HIP ARTHROPLASTY Left 01/03/2013    Procedure: TOTAL HIP ARTHROPLASTY ANTERIOR APPROACH;  Surgeon: Gearlean Alf, MD;  Location: [facility];  Service: Orthopedics;  Laterality: Left;  . TOTAL KNEE ARTHROPLASTY Right 10/06/2014   Procedure: RIGHT TOTAL KNEE ARTHROPLASTY;  Surgeon: Gearlean Alf, MD;  Location: [facility];  Service: Orthopedics;  Laterality: Right;    Family Psychiatric History: see below  Family History:  Family History  Problem Relation Age of Onset  . Arrhythmia Mother        Has pacemaker  . Hypertension Mother   . Heart disease Mother   . Mental illness Mother   . Varicose Veins Mother   . Depression Mother   . Colon cancer Father   . Cancer Father        Prostate and Colon  .  Diabetes Father   . Hypertension Father   . Cancer - Colon Father   . Depression Sister     Social History:  Social History   Socioeconomic History  . Marital status: Married    Spouse name: Not on file  . Number of children: Not on file  . Years of education: Not on file  . Highest education level: Not on file  Occupational History  . Not on file  Tobacco Use  . Smoking status: Former Smoker    Packs/day: 0.50    Types: Cigarettes    Start date: 05/23/2015    Quit date: 10/09/2017    Years since quitting: 2.2  . Smokeless tobacco: Former Systems developer  . Tobacco comment: Stated "I smoke off and on"  Substance and Sexual Activity  . Alcohol use: No    Alcohol/week: 0.0 standard drinks    Comment: 10-12-15 per pt no  . Drug use: No    Comment: 10-12-15 per tp no  . Sexual activity: Yes    Partners: Female  Other Topics Concern  . Not on file  Social History Narrative  . Not on file   Social Determinants of Health   Financial Resource Strain:   . Difficulty of Paying Living Expenses:   Food Insecurity:   . Worried About Charity fundraiser in the Last Year:   . Arboriculturist in the Last Year:   Transportation Needs:   . Film/video editor (Medical):   Marland Kitchen Lack of Transportation (Non-Medical):   Physical  Activity:   . Days of Exercise per Week:   . Minutes of Exercise per Session:   Stress:   . Feeling of Stress :   Social Connections:   . Frequency of Communication with Friends and Family:   . Frequency of Social Gatherings with Friends and Family:   . Attends Religious Services:   . Active Member of Clubs or Organizations:   . Attends Archivist Meetings:   Marland Kitchen Marital Status:     Allergies:  Allergies  Allergen Reactions  . Erythromycin     Caused a "twisted stomach"  . Penicillins Rash    Metabolic Disorder Labs: No results found for: HGBA1C, MPG No results found for: PROLACTIN No results found for: CHOL, TRIG, HDL, CHOLHDL, VLDL, LDLCALC No results found for: TSH  Therapeutic Level Labs: No results found for: LITHIUM No results found for: VALPROATE No components found for:  CBMZ  Current Medications: Current Outpatient Medications  Medication Sig Dispense Refill  . aspirin 325 MG tablet Take 325 mg by mouth daily.    . cyclobenzaprine (FLEXERIL) 5 MG tablet     . dexlansoprazole (DEXILANT) 60 MG capsule Take 60 mg by mouth every morning.     . Dutasteride-Tamsulosin HCl (JALYN) 0.5-0.4 MG CAPS Take 1 capsule by mouth daily at 12 noon.     . ferrous sulfate 325 (65 FE) MG tablet Take 325 mg by mouth daily with breakfast.    . HYDROcodone-acetaminophen (NORCO) 10-325 MG tablet Take 1 tablet by mouth every 6 (six) hours as needed for moderate pain.    Marland Kitchen ibuprofen (ADVIL,MOTRIN) 200 MG tablet Take 800 mg by mouth every 8 (eight) hours as needed for mild pain or moderate pain.    Marland Kitchen ipratropium (ATROVENT) 0.03 % nasal spray Place 2 sprays into both nostrils every 12 (twelve) hours as needed for rhinitis. Reported on 10/23/2015    . lamoTRIgine (LAMICTAL) 200 MG tablet Take 1 tablet (  200 mg total) by mouth at bedtime. 30 tablet 2  . levalbuterol (XOPENEX HFA) 45 MCG/ACT inhaler Inhale 2 puffs into the lungs every 6 (six) hours as needed for wheezing.    Marland Kitchen  levocetirizine (XYZAL) 5 MG tablet Take 1 tablet by mouth at bedtime.    Marland Kitchen lisdexamfetamine (VYVANSE) 70 MG capsule Take 1 capsule (70 mg total) by mouth daily. 30 capsule 0  . lisdexamfetamine (VYVANSE) 70 MG capsule Take 1 capsule (70 mg total) by mouth daily. 30 capsule 0  . lisdexamfetamine (VYVANSE) 70 MG capsule Take 1 capsule (70 mg total) by mouth daily. 30 capsule 0  . losartan-hydrochlorothiazide (HYZAAR) 100-25 MG tablet     . montelukast (SINGULAIR) 10 MG tablet Take 10 mg by mouth every morning.     . Multiple Vitamins-Minerals (MULTIVITAMIN WITH MINERALS) tablet Take 1 tablet by mouth daily.    Marland Kitchen neomycin-polymyxin-hydrocortisone (CORTISPORIN) OTIC solution 1-2 drops to toe twice daily after soaking 10 mL 0  . neomycin-polymyxin-hydrocortisone (CORTISPORIN) OTIC solution Apply 1-2 drops to toe after soaking twice a day 10 mL 0  . Pediatric Multivitamins-Iron (FLINTSTONES PLUS IRON) chewable tablet Chew 1 tablet by mouth 2 (two) times daily.    . predniSONE (DELTASONE) 10 MG tablet     . PROAIR HFA 108 (90 Base) MCG/ACT inhaler     . promethazine (PHENERGAN) 25 MG tablet Take 25 mg by mouth every 6 (six) hours as needed for nausea or vomiting.    . ranitidine (ZANTAC) 150 MG capsule Take 150 mg by mouth daily as needed for heartburn.    . tadalafil (CIALIS) 20 MG tablet Take 20 mg by mouth daily as needed for erectile dysfunction.    . terbinafine (LAMISIL) 250 MG tablet Take 1 tablet (250 mg total) by mouth daily. 90 tablet 0  . testosterone cypionate (DEPOTESTOTERONE CYPIONATE) 200 MG/ML injection Inject 200 mg into the muscle every 14 (fourteen) days.      No current facility-administered medications for this visit.     Musculoskeletal: Strength & Muscle Tone: within normal limits Gait & Station: normal Patient leans: N/A  Psychiatric Specialty Exam: Review of Systems  HENT: Positive for congestion.   All other systems reviewed and are negative.   There were no vitals  taken for this visit.There is no height or weight on file to calculate BMI.  General Appearance: NA  Eye Contact:  NA  Speech:  Clear and Coherent  Volume:  Normal  Mood:  Euthymic  Affect:  NA  Thought Process:  Goal Directed  Orientation:  Full (Time, Place, and Person)  Thought Content: Rumination   Suicidal Thoughts:  No  Homicidal Thoughts:  No  Memory:  Immediate;   Good Recent;   Good Remote;   Good  Judgement:  Good  Insight:  Fair  Psychomotor Activity:  Normal  Concentration:  Concentration: Good and Attention Span: Good  Recall:  Good  Fund of Knowledge: Good  Language: Good  Akathisia:  No  Handed:  Right  AIMS (if indicated): not done  Assets:  Communication Skills Desire for Improvement Physical Health Resilience Social Support Talents/Skills  ADL's:  Intact  Cognition: WNL  Sleep:  Good   Screenings:   Assessment and Plan: This patient is a 64 year old male with a history of bipolar disorder and ADHD.  Although he is in bereavement regarding his mother's death he denies serious depression or suicidal ideation.  He will continue Lamictal 200 mg at bedtime for mood stabilization and Vyvanse  70 mg each morning for ADHD.  He will return to see me in 3 months   [name], MD 01/18/2020, 11:20 AM

## 2020-01-26 DIAGNOSIS — J449 Chronic obstructive pulmonary disease, unspecified: Secondary | ICD-10-CM | POA: Diagnosis not present

## 2020-01-26 DIAGNOSIS — R030 Elevated blood-pressure reading, without diagnosis of hypertension: Secondary | ICD-10-CM | POA: Diagnosis not present

## 2020-01-26 DIAGNOSIS — K219 Gastro-esophageal reflux disease without esophagitis: Secondary | ICD-10-CM | POA: Diagnosis not present

## 2020-01-26 DIAGNOSIS — E7849 Other hyperlipidemia: Secondary | ICD-10-CM | POA: Diagnosis not present

## 2020-01-26 DIAGNOSIS — I10 Essential (primary) hypertension: Secondary | ICD-10-CM | POA: Diagnosis not present

## 2020-01-26 DIAGNOSIS — R7301 Impaired fasting glucose: Secondary | ICD-10-CM | POA: Diagnosis not present

## 2020-01-26 DIAGNOSIS — D649 Anemia, unspecified: Secondary | ICD-10-CM | POA: Diagnosis not present

## 2020-01-26 DIAGNOSIS — J441 Chronic obstructive pulmonary disease with (acute) exacerbation: Secondary | ICD-10-CM | POA: Diagnosis not present

## 2020-01-26 DIAGNOSIS — F319 Bipolar disorder, unspecified: Secondary | ICD-10-CM | POA: Diagnosis not present

## 2020-02-07 DIAGNOSIS — J441 Chronic obstructive pulmonary disease with (acute) exacerbation: Secondary | ICD-10-CM | POA: Diagnosis not present

## 2020-02-07 DIAGNOSIS — E7849 Other hyperlipidemia: Secondary | ICD-10-CM | POA: Diagnosis not present

## 2020-02-07 DIAGNOSIS — R7301 Impaired fasting glucose: Secondary | ICD-10-CM | POA: Diagnosis not present

## 2020-02-07 DIAGNOSIS — F319 Bipolar disorder, unspecified: Secondary | ICD-10-CM | POA: Diagnosis not present

## 2020-02-07 DIAGNOSIS — D649 Anemia, unspecified: Secondary | ICD-10-CM | POA: Diagnosis not present

## 2020-02-07 DIAGNOSIS — I10 Essential (primary) hypertension: Secondary | ICD-10-CM | POA: Diagnosis not present

## 2020-02-07 DIAGNOSIS — R030 Elevated blood-pressure reading, without diagnosis of hypertension: Secondary | ICD-10-CM | POA: Diagnosis not present

## 2020-02-07 DIAGNOSIS — J449 Chronic obstructive pulmonary disease, unspecified: Secondary | ICD-10-CM | POA: Diagnosis not present

## 2020-02-07 DIAGNOSIS — K219 Gastro-esophageal reflux disease without esophagitis: Secondary | ICD-10-CM | POA: Diagnosis not present

## 2020-02-22 DIAGNOSIS — L570 Actinic keratosis: Secondary | ICD-10-CM | POA: Diagnosis not present

## 2020-02-22 DIAGNOSIS — Z85828 Personal history of other malignant neoplasm of skin: Secondary | ICD-10-CM | POA: Diagnosis not present

## 2020-02-22 DIAGNOSIS — L814 Other melanin hyperpigmentation: Secondary | ICD-10-CM | POA: Diagnosis not present

## 2020-02-22 DIAGNOSIS — D225 Melanocytic nevi of trunk: Secondary | ICD-10-CM | POA: Diagnosis not present

## 2020-02-22 DIAGNOSIS — L821 Other seborrheic keratosis: Secondary | ICD-10-CM | POA: Diagnosis not present

## 2020-02-23 DIAGNOSIS — K219 Gastro-esophageal reflux disease without esophagitis: Secondary | ICD-10-CM | POA: Diagnosis not present

## 2020-02-23 DIAGNOSIS — R7301 Impaired fasting glucose: Secondary | ICD-10-CM | POA: Diagnosis not present

## 2020-02-23 DIAGNOSIS — R030 Elevated blood-pressure reading, without diagnosis of hypertension: Secondary | ICD-10-CM | POA: Diagnosis not present

## 2020-02-23 DIAGNOSIS — D649 Anemia, unspecified: Secondary | ICD-10-CM | POA: Diagnosis not present

## 2020-02-23 DIAGNOSIS — F319 Bipolar disorder, unspecified: Secondary | ICD-10-CM | POA: Diagnosis not present

## 2020-02-23 DIAGNOSIS — J449 Chronic obstructive pulmonary disease, unspecified: Secondary | ICD-10-CM | POA: Diagnosis not present

## 2020-02-23 DIAGNOSIS — E7849 Other hyperlipidemia: Secondary | ICD-10-CM | POA: Diagnosis not present

## 2020-02-23 DIAGNOSIS — Z634 Disappearance and death of family member: Secondary | ICD-10-CM | POA: Diagnosis not present

## 2020-02-23 DIAGNOSIS — K429 Umbilical hernia without obstruction or gangrene: Secondary | ICD-10-CM | POA: Diagnosis not present

## 2020-02-23 DIAGNOSIS — J441 Chronic obstructive pulmonary disease with (acute) exacerbation: Secondary | ICD-10-CM | POA: Diagnosis not present

## 2020-02-23 DIAGNOSIS — I10 Essential (primary) hypertension: Secondary | ICD-10-CM | POA: Diagnosis not present

## 2020-03-06 DIAGNOSIS — H43813 Vitreous degeneration, bilateral: Secondary | ICD-10-CM | POA: Diagnosis not present

## 2020-03-06 DIAGNOSIS — H2511 Age-related nuclear cataract, right eye: Secondary | ICD-10-CM | POA: Diagnosis not present

## 2020-03-06 DIAGNOSIS — H40031 Anatomical narrow angle, right eye: Secondary | ICD-10-CM | POA: Diagnosis not present

## 2020-03-06 DIAGNOSIS — Z961 Presence of intraocular lens: Secondary | ICD-10-CM | POA: Diagnosis not present

## 2020-03-13 DIAGNOSIS — R030 Elevated blood-pressure reading, without diagnosis of hypertension: Secondary | ICD-10-CM | POA: Diagnosis not present

## 2020-03-13 DIAGNOSIS — Z634 Disappearance and death of family member: Secondary | ICD-10-CM | POA: Diagnosis not present

## 2020-03-13 DIAGNOSIS — K429 Umbilical hernia without obstruction or gangrene: Secondary | ICD-10-CM | POA: Diagnosis not present

## 2020-03-13 DIAGNOSIS — D649 Anemia, unspecified: Secondary | ICD-10-CM | POA: Diagnosis not present

## 2020-03-13 DIAGNOSIS — I10 Essential (primary) hypertension: Secondary | ICD-10-CM | POA: Diagnosis not present

## 2020-03-13 DIAGNOSIS — F319 Bipolar disorder, unspecified: Secondary | ICD-10-CM | POA: Diagnosis not present

## 2020-03-13 DIAGNOSIS — R7301 Impaired fasting glucose: Secondary | ICD-10-CM | POA: Diagnosis not present

## 2020-03-13 DIAGNOSIS — J441 Chronic obstructive pulmonary disease with (acute) exacerbation: Secondary | ICD-10-CM | POA: Diagnosis not present

## 2020-03-13 DIAGNOSIS — J449 Chronic obstructive pulmonary disease, unspecified: Secondary | ICD-10-CM | POA: Diagnosis not present

## 2020-03-13 DIAGNOSIS — K219 Gastro-esophageal reflux disease without esophagitis: Secondary | ICD-10-CM | POA: Diagnosis not present

## 2020-03-13 DIAGNOSIS — E7849 Other hyperlipidemia: Secondary | ICD-10-CM | POA: Diagnosis not present

## 2020-04-12 ENCOUNTER — Encounter (HOSPITAL_COMMUNITY): Payer: Self-pay | Admitting: Psychiatry

## 2020-04-12 ENCOUNTER — Telehealth (INDEPENDENT_AMBULATORY_CARE_PROVIDER_SITE_OTHER): Payer: PPO | Admitting: Psychiatry

## 2020-04-12 ENCOUNTER — Other Ambulatory Visit: Payer: Self-pay

## 2020-04-12 DIAGNOSIS — F3162 Bipolar disorder, current episode mixed, moderate: Secondary | ICD-10-CM | POA: Diagnosis not present

## 2020-04-12 DIAGNOSIS — F900 Attention-deficit hyperactivity disorder, predominantly inattentive type: Secondary | ICD-10-CM

## 2020-04-12 MED ORDER — LISDEXAMFETAMINE DIMESYLATE 70 MG PO CAPS: 70.0000 mg | ORAL_CAPSULE | Freq: Every day | ORAL | 0 refills | Status: DC

## 2020-04-12 MED ORDER — LAMOTRIGINE 200 MG PO TABS: 200.0000 mg | ORAL_TABLET | Freq: Every day | ORAL | 2 refills | Status: DC

## 2020-04-12 NOTE — Progress Notes (Signed)
Virtual Visit via Telephone Note  I connected with Philip Gates on 04/12/20 at  8:40 AM EDT by telephone and verified that I am speaking with the correct person using two identifiers.   I discussed the limitations, risks, security and privacy concerns of performing an evaluation and management service by telephone and the availability of in person appointments. I also discussed with the patient that there may be a patient responsible charge related to this service. The patient expressed understanding and agreed to proceed.    I discussed the assessment and treatment plan with the patient. The patient was provided an opportunity to ask questions and all were answered. The patient agreed with the plan and demonstrated an understanding of the instructions.   The patient was advised to call back or seek an in-person evaluation if the symptoms worsen or if the condition fails to improve as anticipated.  I provided 15 minutes of non-face-to-face time during this encounter. Location: Provider office, patient home  Philip Spiller, MD  [facility] MD/PA/NP OP Progress Note  04/12/2020 8:57 AM Philip Gates  MRN:  [id number]  Chief Complaint:  Chief Complaint    Follow-up; ADHD; Manic Behavior     HPI: This patient is a64 year old married white male lives with his wife in [place]. 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  The patient was referred by his primary physician, Dr. Wende Neighbors, for further assessment and treatment of bipolar disorder and ADHD.  The patient is a vague historian and he tends to ramble a lot. Apparently as a child he was very unfocused and hyperactive. He is always had difficulty completing tasks. He was never diagnosed with ADHD in childhood and he was able to complete high school and 1 year of machine shop training. He has worked in numerous jobs in Film/video editor.  The patient drank heavily  and used drugs like marijuana and cocaine for quite a number of years. He quit all this about 10 years ago. About 7 years ago he became increasingly angry and moody and irritable. Some of it started when he and his wife are building a house and he felt like the contractor "screwed me out of $100,000.". He began seeing Dr. Candis Schatz in [place] and was diagnosed as being bipolar because he had significant mood swings anger and irritability. Since that he's been on a combination of Lamictal and Depakote which he thinks has helped. Because of his long-term difficulties with focus and concentration he was started on Vyvanse which he also thinks has helped.  The patient still has some difficulties with irritability but not like he used to. He and his wife don't get along but he admits that he was having an affair with an old girlfriend for the last few years and still talks to this woman periodically. His wife found out about it and is quite resentful. He also has significant memory problems and often loses his train of thought. He denies any head injuries. 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 He has numerous medical problems and has chronic pain from arthritis. Last year he also had knee surgery and is well as a small bowel obstruction.  The patient no longer uses drugs or alcohol. He is active in church and does a lot of things with his cousin. His energy is pretty good and he states he sleeps fairly well. He has never had  psychiatric hospitalization or treatment for drug or alcohol abuse  The patient returns for follow-up after 3 months.  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  They have won the state tournament and now are going to the [employer] later today.  He is very excited and happy about it.  His mood has been very good.  He denies any recent medical changes.  He has not had any temper outbursts.  The Vyvanse  continues to help him stay focused. Visit Diagnosis:    ICD-10-CM   1. Attention deficit hyperactivity disorder (ADHD), predominantly inattentive type  F90.0   2. Bipolar 1 disorder, mixed, moderate (HCC)  F31.62     Past Psychiatric History: Past outpatient treatment for bipolar disorder  Past Medical History:  Past Medical History:  Diagnosis Date  . Acid reflux   . 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 Arthritis   . Asthma    ACTIVITY INDUCED  . Bipolar 1 disorder ([place])   . BPH (benign prostatic hyperplasia)   . Cellulitis 05/2014   right side face  . Cerebral aneurysm    has a stent  . GERD (gastroesophageal reflux disease)   . Neck pain, chronic   . Sinus drainage   . 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  . Venous insufficiency     Past Surgical History:  Procedure Laterality Date  . cerebral stent    . COLONOSCOPY N/A 01/02/2017   Procedure: COLONOSCOPY;  Surgeon: Rogene Houston, MD;  [facility];  Service: Endoscopy;  Laterality: N/A;  . dental implant  09/26/2014   left lower dental implant  . ESOPHAGOGASTRODUODENOSCOPY N/A 01/02/2017   Procedure: ESOPHAGOGASTRODUODENOSCOPY (EGD);  Surgeon: Rogene Houston, MD;  [facility]: [facility];  Service: Endoscopy;  Laterality: N/A;  910  . EYE SURGERY Left Jan. 16, 2017   Cataract  . IR ANGIO INTRA EXTRACRAN SEL INTERNAL CAROTID BILAT MOD SED  01/20/2017  . IR ANGIO VERTEBRAL SEL VERTEBRAL UNI L MOD SED  01/20/2017  . JOINT REPLACEMENT Right    Knee  . JOINT REPLACEMENT Left    Hip  . NOSE SURGERY    . RADIOLOGY WITH ANESTHESIA N/A 12/29/2014   Procedure: Embolization;  Surgeon: Consuella Lose, MD;  Location: [facility];  Service: Radiology;  Laterality: N/A;  . SUBCLAVIAN ANGIOGRAM  Nov. 8, 2016  . TOTAL HIP ARTHROPLASTY Left 01/03/2013   Procedure: TOTAL HIP ARTHROPLASTY ANTERIOR APPROACH;  Surgeon: Gearlean Alf, MD;  Location:  [facility];  Service: Orthopedics;  Laterality: Left;  . TOTAL KNEE ARTHROPLASTY Right 10/06/2014   Procedure: RIGHT TOTAL KNEE ARTHROPLASTY;  Surgeon: Gearlean Alf, MD;  Location: [facility];  Service: Orthopedics;  Laterality: Right;    Family Psychiatric History: see below  Family History:  Family History  Problem Relation Age of Onset  . Arrhythmia Mother        Has pacemaker  . Hypertension Mother   . Heart disease Mother   . Mental illness Mother   . Varicose Veins Mother   . Depression Mother   . Colon cancer Father   . Cancer Father        Prostate and Colon  . Diabetes Father   . Hypertension Father   . Cancer - Colon Father   . Depression Sister     Social History:  Social History   Socioeconomic History  . Marital status: Married    Spouse name: Not on file  . Number of children: Not on file  . Years of education: Not on file  . Highest education level: Not on file  Occupational History  . Not on file  Tobacco Use  . Smoking status: Former Smoker    Packs/day: 0.50    Types: Cigarettes    Start date: 05/23/2015    Quit date: 10/09/2017    Years since quitting: 2.5  . Smokeless tobacco: Former Systems developer  . Tobacco comment: Stated "I smoke off and on"  Substance and Sexual Activity  . Alcohol use: No    Alcohol/week: 0.0 standard drinks    Comment: 10-12-15 per pt no  . Drug use: No    Comment: 10-12-15 per tp no  . Sexual activity: Yes    Partners: Female  Other Topics Concern  . Not on file  Social History Narrative  . Not on file   Social Determinants of Health   Financial Resource Strain:   . Difficulty of Paying Living Expenses:   Food Insecurity:   . Worried About Charity fundraiser in the Last Year:   . Arboriculturist in the Last Year:   Transportation Needs:   . Film/video editor (Medical):   Marland Kitchen Lack of Transportation (Non-Medical):   Physical Activity:   . Days of Exercise per Week:   . Minutes of Exercise per Session:   Stress:   .  Feeling of Stress :   Social Connections:   . Frequency of Communication with Friends and Family:   . Frequency of Social Gatherings with Friends and Family:   . Attends Religious Services:   . Active Member of Clubs or Organizations:   . Attends Archivist Meetings:   Marland Kitchen Marital Status:     Allergies:  Allergies  Allergen Reactions  . Erythromycin     Caused a "twisted stomach"  . Penicillins Rash    Metabolic Disorder Labs: No results found for: HGBA1C, MPG No results found for: PROLACTIN No results found for: CHOL, TRIG, HDL, CHOLHDL, VLDL, LDLCALC No results found for: TSH  Therapeutic Level Labs: No results found for: LITHIUM No results found for: VALPROATE No components found for:  CBMZ  Current Medications: Current Outpatient Medications  Medication Sig Dispense Refill  . aspirin 325 MG tablet Take 325 mg by mouth daily.    . cyclobenzaprine (FLEXERIL) 5 MG tablet     . dexlansoprazole (DEXILANT) 60 MG capsule Take 60 mg by mouth every morning.     . Dutasteride-Tamsulosin HCl (JALYN) 0.5-0.4 MG CAPS Take 1 capsule by mouth daily at 12 noon.     . ferrous sulfate 325 (65 FE) MG tablet Take 325 mg by mouth daily with breakfast.    . HYDROcodone-acetaminophen (NORCO) 10-325 MG tablet Take 1 tablet by mouth every 6 (six) hours as needed for moderate pain.    Marland Kitchen ibuprofen (ADVIL,MOTRIN) 200 MG tablet Take 800 mg by mouth every 8 (eight) hours as needed for mild pain or moderate pain.    Marland Kitchen ipratropium (ATROVENT) 0.03 % nasal spray Place 2 sprays into both nostrils every 12 (twelve) hours as needed for rhinitis. Reported on 10/23/2015    . lamoTRIgine (LAMICTAL) 200 MG tablet Take 1 tablet (200 mg total) by mouth at bedtime. 30 tablet 2  . levalbuterol (XOPENEX HFA) 45 MCG/ACT inhaler Inhale 2 puffs into the lungs every 6 (  six) hours as needed for wheezing.    Marland Kitchen levocetirizine (XYZAL) 5 MG tablet Take 1 tablet by mouth at bedtime.    Marland Kitchen lisdexamfetamine (VYVANSE) 70  MG capsule Take 1 capsule (70 mg total) by mouth daily. 30 capsule 0  . lisdexamfetamine (VYVANSE) 70 MG capsule Take 1 capsule (70 mg total) by mouth daily. 30 capsule 0  . lisdexamfetamine (VYVANSE) 70 MG capsule Take 1 capsule (70 mg total) by mouth daily. 30 capsule 0  . losartan-hydrochlorothiazide (HYZAAR) 100-25 MG tablet     . montelukast (SINGULAIR) 10 MG tablet Take 10 mg by mouth every morning.     . Multiple Vitamins-Minerals (MULTIVITAMIN WITH MINERALS) tablet Take 1 tablet by mouth daily.    Marland Kitchen neomycin-polymyxin-hydrocortisone (CORTISPORIN) OTIC solution 1-2 drops to toe twice daily after soaking 10 mL 0  . neomycin-polymyxin-hydrocortisone (CORTISPORIN) OTIC solution Apply 1-2 drops to toe after soaking twice a day 10 mL 0  . Pediatric Multivitamins-Iron (FLINTSTONES PLUS IRON) chewable tablet Chew 1 tablet by mouth 2 (two) times daily.    . predniSONE (DELTASONE) 10 MG tablet     . PROAIR HFA 108 (90 Base) MCG/ACT inhaler     . promethazine (PHENERGAN) 25 MG tablet Take 25 mg by mouth every 6 (six) hours as needed for nausea or vomiting.    . ranitidine (ZANTAC) 150 MG capsule Take 150 mg by mouth daily as needed for heartburn.    . tadalafil (CIALIS) 20 MG tablet Take 20 mg by mouth daily as needed for erectile dysfunction.    . terbinafine (LAMISIL) 250 MG tablet Take 1 tablet (250 mg total) by mouth daily. 90 tablet 0  . testosterone cypionate (DEPOTESTOTERONE CYPIONATE) 200 MG/ML injection Inject 200 mg into the muscle every 14 (fourteen) days.      No current facility-administered medications for this visit.     Musculoskeletal: Strength & Muscle Tone: within normal limits Gait & Station: normal Patient leans: N/A  Psychiatric Specialty Exam: Review of Systems  HENT: Positive for rhinorrhea.   All other systems reviewed and are negative.   There were no vitals taken for this visit.There is no height or weight on file to calculate BMI.  General Appearance: NA  Eye  Contact:  NA  Speech:  Clear and Coherent  Volume:  Normal  Mood:  Euthymic  Affect:  NA  Thought Process:  Goal Directed  Orientation:  Full (Time, Place, and Person)  Thought Content: WDL   Suicidal Thoughts:  No  Homicidal Thoughts:  No  Memory:  Immediate;   Good Recent;   Good Remote;   Good  Judgement:  Good  Insight:  Fair  Psychomotor Activity:  Normal  Concentration:  Concentration: Good and Attention Span: Good  Recall:  Good  Fund of Knowledge: Good  Language: Good  Akathisia:  No  Handed:  Right  AIMS (if indicated): not done  Assets:  Communication Skills Desire for Improvement Physical Health Resilience Social Support Talents/Skills  ADL's:  Intact  Cognition: WNL  Sleep:  Good   Screenings:   Assessment and Plan: This patient is a 64 year old male with a history of bipolar disorder and ADHD.  His mood has been stable and his ADHD is under good control.  He will continue Lamictal 200 mg at bedtime for mood stabilization and Vyvanse 70 mg every morning for ADHD.  He will return to see me in 3 months   Philip Spiller, MD 04/12/2020, 8:57 AM

## 2020-04-17 DIAGNOSIS — K429 Umbilical hernia without obstruction or gangrene: Secondary | ICD-10-CM | POA: Diagnosis not present

## 2020-04-17 DIAGNOSIS — K219 Gastro-esophageal reflux disease without esophagitis: Secondary | ICD-10-CM | POA: Diagnosis not present

## 2020-04-17 DIAGNOSIS — R7301 Impaired fasting glucose: Secondary | ICD-10-CM | POA: Diagnosis not present

## 2020-04-17 DIAGNOSIS — R030 Elevated blood-pressure reading, without diagnosis of hypertension: Secondary | ICD-10-CM | POA: Diagnosis not present

## 2020-04-17 DIAGNOSIS — Z634 Disappearance and death of family member: Secondary | ICD-10-CM | POA: Diagnosis not present

## 2020-04-17 DIAGNOSIS — I10 Essential (primary) hypertension: Secondary | ICD-10-CM | POA: Diagnosis not present

## 2020-04-17 DIAGNOSIS — D649 Anemia, unspecified: Secondary | ICD-10-CM | POA: Diagnosis not present

## 2020-04-17 DIAGNOSIS — J449 Chronic obstructive pulmonary disease, unspecified: Secondary | ICD-10-CM | POA: Diagnosis not present

## 2020-04-17 DIAGNOSIS — F319 Bipolar disorder, unspecified: Secondary | ICD-10-CM | POA: Diagnosis not present

## 2020-04-17 DIAGNOSIS — J441 Chronic obstructive pulmonary disease with (acute) exacerbation: Secondary | ICD-10-CM | POA: Diagnosis not present

## 2020-04-17 DIAGNOSIS — E7849 Other hyperlipidemia: Secondary | ICD-10-CM | POA: Diagnosis not present

## 2020-04-19 ENCOUNTER — Telehealth (HOSPITAL_COMMUNITY): Payer: PPO | Admitting: Psychiatry

## 2020-05-01 DIAGNOSIS — H6503 Acute serous otitis media, bilateral: Secondary | ICD-10-CM | POA: Diagnosis not present

## 2020-05-01 DIAGNOSIS — J449 Chronic obstructive pulmonary disease, unspecified: Secondary | ICD-10-CM | POA: Diagnosis not present

## 2020-05-01 DIAGNOSIS — K149 Disease of tongue, unspecified: Secondary | ICD-10-CM | POA: Diagnosis not present

## 2020-05-01 DIAGNOSIS — E7849 Other hyperlipidemia: Secondary | ICD-10-CM | POA: Diagnosis not present

## 2020-05-01 DIAGNOSIS — K140 Glossitis: Secondary | ICD-10-CM | POA: Diagnosis not present

## 2020-05-01 DIAGNOSIS — R252 Cramp and spasm: Secondary | ICD-10-CM | POA: Diagnosis not present

## 2020-05-01 DIAGNOSIS — J019 Acute sinusitis, unspecified: Secondary | ICD-10-CM | POA: Diagnosis not present

## 2020-05-01 DIAGNOSIS — J069 Acute upper respiratory infection, unspecified: Secondary | ICD-10-CM | POA: Diagnosis not present

## 2020-05-01 DIAGNOSIS — Z6830 Body mass index (BMI) 30.0-30.9, adult: Secondary | ICD-10-CM | POA: Diagnosis not present

## 2020-05-01 DIAGNOSIS — Z6831 Body mass index (BMI) 31.0-31.9, adult: Secondary | ICD-10-CM | POA: Diagnosis not present

## 2020-05-01 DIAGNOSIS — H65196 Other acute nonsuppurative otitis media, recurrent, bilateral: Secondary | ICD-10-CM | POA: Diagnosis not present

## 2020-05-01 DIAGNOSIS — M170 Bilateral primary osteoarthritis of knee: Secondary | ICD-10-CM | POA: Diagnosis not present

## 2020-05-04 DIAGNOSIS — Z0001 Encounter for general adult medical examination with abnormal findings: Secondary | ICD-10-CM | POA: Diagnosis not present

## 2020-05-11 DIAGNOSIS — J069 Acute upper respiratory infection, unspecified: Secondary | ICD-10-CM | POA: Diagnosis not present

## 2020-05-11 DIAGNOSIS — Z1152 Encounter for screening for COVID-19: Secondary | ICD-10-CM | POA: Diagnosis not present

## 2020-05-11 DIAGNOSIS — R0602 Shortness of breath: Secondary | ICD-10-CM | POA: Diagnosis not present

## 2020-05-22 DIAGNOSIS — R030 Elevated blood-pressure reading, without diagnosis of hypertension: Secondary | ICD-10-CM | POA: Diagnosis not present

## 2020-05-22 DIAGNOSIS — K219 Gastro-esophageal reflux disease without esophagitis: Secondary | ICD-10-CM | POA: Diagnosis not present

## 2020-05-22 DIAGNOSIS — J441 Chronic obstructive pulmonary disease with (acute) exacerbation: Secondary | ICD-10-CM | POA: Diagnosis not present

## 2020-05-22 DIAGNOSIS — I10 Essential (primary) hypertension: Secondary | ICD-10-CM | POA: Diagnosis not present

## 2020-05-22 DIAGNOSIS — R7301 Impaired fasting glucose: Secondary | ICD-10-CM | POA: Diagnosis not present

## 2020-05-22 DIAGNOSIS — F319 Bipolar disorder, unspecified: Secondary | ICD-10-CM | POA: Diagnosis not present

## 2020-05-22 DIAGNOSIS — Z6829 Body mass index (BMI) 29.0-29.9, adult: Secondary | ICD-10-CM | POA: Insufficient documentation

## 2020-05-22 DIAGNOSIS — M5412 Radiculopathy, cervical region: Secondary | ICD-10-CM | POA: Diagnosis not present

## 2020-05-22 DIAGNOSIS — J449 Chronic obstructive pulmonary disease, unspecified: Secondary | ICD-10-CM | POA: Diagnosis not present

## 2020-05-22 DIAGNOSIS — E7849 Other hyperlipidemia: Secondary | ICD-10-CM | POA: Diagnosis not present

## 2020-05-22 DIAGNOSIS — D649 Anemia, unspecified: Secondary | ICD-10-CM | POA: Diagnosis not present

## 2020-06-19 DIAGNOSIS — E7849 Other hyperlipidemia: Secondary | ICD-10-CM | POA: Diagnosis not present

## 2020-06-19 DIAGNOSIS — I10 Essential (primary) hypertension: Secondary | ICD-10-CM | POA: Diagnosis not present

## 2020-06-19 DIAGNOSIS — Z720 Tobacco use: Secondary | ICD-10-CM | POA: Diagnosis not present

## 2020-06-19 DIAGNOSIS — J449 Chronic obstructive pulmonary disease, unspecified: Secondary | ICD-10-CM | POA: Diagnosis not present

## 2020-06-27 DIAGNOSIS — I10 Essential (primary) hypertension: Secondary | ICD-10-CM | POA: Insufficient documentation

## 2020-06-27 DIAGNOSIS — M5126 Other intervertebral disc displacement, lumbar region: Secondary | ICD-10-CM | POA: Diagnosis not present

## 2020-06-27 DIAGNOSIS — Z6829 Body mass index (BMI) 29.0-29.9, adult: Secondary | ICD-10-CM | POA: Diagnosis not present

## 2020-06-27 DIAGNOSIS — M48062 Spinal stenosis, lumbar region with neurogenic claudication: Secondary | ICD-10-CM | POA: Diagnosis not present

## 2020-07-04 DIAGNOSIS — Z23 Encounter for immunization: Secondary | ICD-10-CM | POA: Diagnosis not present

## 2020-07-04 DIAGNOSIS — J029 Acute pharyngitis, unspecified: Secondary | ICD-10-CM | POA: Diagnosis not present

## 2020-07-13 ENCOUNTER — Telehealth (INDEPENDENT_AMBULATORY_CARE_PROVIDER_SITE_OTHER): Payer: PPO | Admitting: Psychiatry

## 2020-07-13 ENCOUNTER — Other Ambulatory Visit: Payer: Self-pay

## 2020-07-13 ENCOUNTER — Encounter (HOSPITAL_COMMUNITY): Payer: Self-pay | Admitting: Psychiatry

## 2020-07-13 DIAGNOSIS — F3162 Bipolar disorder, current episode mixed, moderate: Secondary | ICD-10-CM

## 2020-07-13 DIAGNOSIS — F900 Attention-deficit hyperactivity disorder, predominantly inattentive type: Secondary | ICD-10-CM | POA: Diagnosis not present

## 2020-07-13 MED ORDER — LISDEXAMFETAMINE DIMESYLATE 70 MG PO CAPS: 70.0000 mg | ORAL_CAPSULE | Freq: Every day | ORAL | 0 refills | Status: DC

## 2020-07-13 MED ORDER — LAMOTRIGINE 200 MG PO TABS: 200.0000 mg | ORAL_TABLET | Freq: Every day | ORAL | 2 refills | Status: DC

## 2020-07-13 NOTE — Progress Notes (Signed)
Virtual Visit via Telephone Note  I connected with Philip Gates on 07/13/20 at  9:00 AM EDT by telephone and verified that I am speaking with the correct person using two identifiers.  Location: Patient: home  Provider: home   I discussed the limitations, risks, security and privacy concerns of performing an evaluation and management service by telephone and the availability of in person appointments. I also discussed with the patient that there may be a patient responsible charge related to this service. The patient expressed understanding and agreed to proceed.     I discussed the assessment and treatment plan with the patient. The patient was provided an opportunity to ask questions and all were answered. The patient agreed with the plan and demonstrated an understanding of the instructions.   The patient was advised to call back or seek an in-person evaluation if the symptoms worsen or if the condition fails to improve as anticipated.  I provided 15 minutes of non-face-to-face time during this encounter.   Philip Spiller, MD  [facility] MD/PA/NP OP Progress Note  07/13/2020 9:19 AM Philip Gates  MRN:  [id number]  Chief Complaint:  Chief Complaint    Manic Behavior; ADHD; Follow-up     HPI: This patient is a26 year old married white male lives with his wife in [place]. 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  The patient was referred by his primary physician, Dr. Wende Neighbors, for further assessment and treatment of bipolar disorder and ADHD.  The patient is a vague historian and he tends to ramble a lot. Apparently as a child he was very unfocused and hyperactive. He is always had difficulty completing tasks. He was never diagnosed with ADHD in childhood and he was able to complete high school and 1 year of machine shop training. He has worked in numerous jobs in Film/video editor.  The patient  drank heavily and used drugs like marijuana and cocaine for quite a number of years. He quit all this about 10 years ago. About 7 years ago he became increasingly angry and moody and irritable. Some of it started when he and his wife are building a house and he felt like the contractor "screwed me out of $100,000.". He began seeing Dr. Candis Schatz in [place] and was diagnosed as being bipolar because he had significant mood swings anger and irritability. Since that he's been on a combination of Lamictal and Depakote which he thinks has helped. Because of his long-term difficulties with focus and concentration he was started on Vyvanse which he also thinks has helped.  The patient still has some difficulties with irritability but not like he used to. He and his wife don't get along but he admits that he was having an affair with an old girlfriend for the last few years and still talks to this woman periodically. His wife found out about it and is quite resentful. He also has significant memory problems and often loses his train of thought. He denies any head injuries. 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 He has numerous medical problems and has chronic pain from arthritis. Last year he also had knee surgery and is well as a small bowel obstruction.  The patient no longer uses drugs or alcohol. He is active in church and does a lot of things with his cousin. His energy is pretty good and he states he sleeps fairly well.  He has never had psychiatric hospitalization or treatment for drug or alcohol abuse  The patient returns for follow-up after 3 months.  He has had some health issues such as pain in his neck and back and he has been getting injections which have helped.  He is also suffering from tinnitus and has ordered some sort of vitamin formulary for this.  Other than that his health has been fairly good.  He states his mood is good and he denies significant anger or irritability.   His focus remains good on the Vyvanse. Visit Diagnosis:    ICD-10-CM   1. Attention deficit hyperactivity disorder (ADHD), predominantly inattentive type  F90.0   2. Bipolar 1 disorder, mixed, moderate (HCC)  F31.62     Past Psychiatric History: Past outpatient treatment for bipolar disorder  Past Medical History:  Past Medical History:  Diagnosis Date  . Acid reflux   . 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 Arthritis   . Asthma    ACTIVITY INDUCED  . Bipolar 1 disorder ([place])   . BPH (benign prostatic hyperplasia)   . Cellulitis 05/2014   right side face  . Cerebral aneurysm    has a stent  . GERD (gastroesophageal reflux disease)   . Neck pain, chronic   . Sinus drainage   . 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  . Venous insufficiency     Past Surgical History:  Procedure Laterality Date  . cerebral stent    . COLONOSCOPY N/A 01/02/2017   Procedure: COLONOSCOPY;  Surgeon: Rogene Houston, MD;  [facility]: [facility];  Service: Endoscopy;  Laterality: N/A;  . dental implant  09/26/2014   left lower dental implant  . ESOPHAGOGASTRODUODENOSCOPY N/A 01/02/2017   Procedure: ESOPHAGOGASTRODUODENOSCOPY (EGD);  Surgeon: Rogene Houston, MD;  [facility]: [facility];  Service: Endoscopy;  Laterality: N/A;  910  . EYE SURGERY Left Jan. 16, 2017   Cataract  . IR ANGIO INTRA EXTRACRAN SEL INTERNAL CAROTID BILAT MOD SED  01/20/2017  . IR ANGIO VERTEBRAL SEL VERTEBRAL UNI L MOD SED  01/20/2017  . JOINT REPLACEMENT Right    Knee  . JOINT REPLACEMENT Left    Hip  . NOSE SURGERY    . RADIOLOGY WITH ANESTHESIA N/A 12/29/2014   Procedure: Embolization;  Surgeon: Consuella Lose, MD;  Location: [facility];  Service: Radiology;  Laterality: N/A;  . SUBCLAVIAN ANGIOGRAM  Nov. 8, 2016  . TOTAL HIP ARTHROPLASTY Left 01/03/2013   Procedure: TOTAL HIP ARTHROPLASTY ANTERIOR APPROACH;  Surgeon: Gearlean Alf, MD;   Location: [facility];  Service: Orthopedics;  Laterality: Left;  . TOTAL KNEE ARTHROPLASTY Right 10/06/2014   Procedure: RIGHT TOTAL KNEE ARTHROPLASTY;  Surgeon: Gearlean Alf, MD;  Location: [facility];  Service: Orthopedics;  Laterality: Right;    Family Psychiatric History: see below  Family History:  Family History  Problem Relation Age of Onset  . Arrhythmia Mother        Has pacemaker  . Hypertension Mother   . Heart disease Mother   . Mental illness Mother   . Varicose Veins Mother   . Depression Mother   . Colon cancer Father   . Cancer Father        Prostate and Colon  . Diabetes Father   . Hypertension Father   . Cancer - Colon Father   . Depression Sister  Social History:  Social History   Socioeconomic History  . Marital status: Married    Spouse name: Not on file  . Number of children: Not on file  . Years of education: Not on file  . Highest education level: Not on file  Occupational History  . Not on file  Tobacco Use  . Smoking status: Former Smoker    Packs/day: 0.50    Types: Cigarettes    Start date: 05/23/2015    Quit date: 10/09/2017    Years since quitting: 2.7  . Smokeless tobacco: Former Systems developer  . Tobacco comment: Stated "I smoke off and on"  Substance and Sexual Activity  . Alcohol use: No    Alcohol/week: 0.0 standard drinks    Comment: 10-12-15 per pt no  . Drug use: No    Comment: 10-12-15 per tp no  . Sexual activity: Yes    Partners: Female  Other Topics Concern  . Not on file  Social History Narrative  . Not on file   Social Determinants of Health   Financial Resource Strain:   . Difficulty of Paying Living Expenses: Not on file  Food Insecurity:   . Worried About Charity fundraiser in the Last Year: Not on file  . Ran Out of Food in the Last Year: Not on file  Transportation Needs:   . Lack of Transportation (Medical): Not on file  . Lack of Transportation (Non-Medical): Not on file  Physical Activity:   . Days of Exercise per  Week: Not on file  . Minutes of Exercise per Session: Not on file  Stress:   . Feeling of Stress : Not on file  Social Connections:   . Frequency of Communication with Friends and Family: Not on file  . Frequency of Social Gatherings with Friends and Family: Not on file  . Attends Religious Services: Not on file  . Active Member of Clubs or Organizations: Not on file  . Attends Archivist Meetings: Not on file  . Marital Status: Not on file    Allergies:  Allergies  Allergen Reactions  . Erythromycin     Caused a "twisted stomach"  . Penicillins Rash    Metabolic Disorder Labs: No results found for: HGBA1C, MPG No results found for: PROLACTIN No results found for: CHOL, TRIG, HDL, CHOLHDL, VLDL, LDLCALC No results found for: TSH  Therapeutic Level Labs: No results found for: LITHIUM No results found for: VALPROATE No components found for:  CBMZ  Current Medications: Current Outpatient Medications  Medication Sig Dispense Refill  . aspirin 325 MG tablet Take 325 mg by mouth daily.    . cyclobenzaprine (FLEXERIL) 5 MG tablet     . dexlansoprazole (DEXILANT) 60 MG capsule Take 60 mg by mouth every morning.     . Dutasteride-Tamsulosin HCl (JALYN) 0.5-0.4 MG CAPS Take 1 capsule by mouth daily at 12 noon.     . ferrous sulfate 325 (65 FE) MG tablet Take 325 mg by mouth daily with breakfast.    . HYDROcodone-acetaminophen (NORCO) 10-325 MG tablet Take 1 tablet by mouth every 6 (six) hours as needed for moderate pain.    Marland Kitchen ibuprofen (ADVIL,MOTRIN) 200 MG tablet Take 800 mg by mouth every 8 (eight) hours as needed for mild pain or moderate pain.    Marland Kitchen ipratropium (ATROVENT) 0.03 % nasal spray Place 2 sprays into both nostrils every 12 (twelve) hours as needed for rhinitis. Reported on 10/23/2015    . lamoTRIgine (LAMICTAL) 200  MG tablet Take 1 tablet (200 mg total) by mouth at bedtime. 30 tablet 2  . levalbuterol (XOPENEX HFA) 45 MCG/ACT inhaler Inhale 2 puffs into the  lungs every 6 (six) hours as needed for wheezing.    Marland Kitchen levocetirizine (XYZAL) 5 MG tablet Take 1 tablet by mouth at bedtime.    Marland Kitchen lisdexamfetamine (VYVANSE) 70 MG capsule Take 1 capsule (70 mg total) by mouth daily. 30 capsule 0  . lisdexamfetamine (VYVANSE) 70 MG capsule Take 1 capsule (70 mg total) by mouth daily. 30 capsule 0  . lisdexamfetamine (VYVANSE) 70 MG capsule Take 1 capsule (70 mg total) by mouth daily. 30 capsule 0  . losartan-hydrochlorothiazide (HYZAAR) 100-25 MG tablet     . montelukast (SINGULAIR) 10 MG tablet Take 10 mg by mouth every morning.     . Multiple Vitamins-Minerals (MULTIVITAMIN WITH MINERALS) tablet Take 1 tablet by mouth daily.    Marland Kitchen neomycin-polymyxin-hydrocortisone (CORTISPORIN) OTIC solution 1-2 drops to toe twice daily after soaking 10 mL 0  . neomycin-polymyxin-hydrocortisone (CORTISPORIN) OTIC solution Apply 1-2 drops to toe after soaking twice a day 10 mL 0  . Pediatric Multivitamins-Iron (FLINTSTONES PLUS IRON) chewable tablet Chew 1 tablet by mouth 2 (two) times daily.    . predniSONE (DELTASONE) 10 MG tablet     . PROAIR HFA 108 (90 Base) MCG/ACT inhaler     . promethazine (PHENERGAN) 25 MG tablet Take 25 mg by mouth every 6 (six) hours as needed for nausea or vomiting.    . ranitidine (ZANTAC) 150 MG capsule Take 150 mg by mouth daily as needed for heartburn.    . tadalafil (CIALIS) 20 MG tablet Take 20 mg by mouth daily as needed for erectile dysfunction.    . terbinafine (LAMISIL) 250 MG tablet Take 1 tablet (250 mg total) by mouth daily. 90 tablet 0  . testosterone cypionate (DEPOTESTOTERONE CYPIONATE) 200 MG/ML injection Inject 200 mg into the muscle every 14 (fourteen) days.      No current facility-administered medications for this visit.     Musculoskeletal: Strength & Muscle Tone: within normal limits Gait & Station: normal Patient leans: N/A  Psychiatric Specialty Exam: Review of Systems  HENT: Positive for tinnitus.   Musculoskeletal:  Positive for arthralgias, back pain and neck pain.  All other systems reviewed and are negative.   There were no vitals taken for this visit.There is no height or weight on file to calculate BMI.  General Appearance: NA  Eye Contact:  NA  Speech:  Clear and Coherent  Volume:  Normal  Mood:  Euthymic  Affect:  NA  Thought Process:  Goal Directed  Orientation:  Full (Time, Place, and Person)  Thought Content: WDL   Suicidal Thoughts:  No  Homicidal Thoughts:  No  Memory:  Immediate;   Good Recent;   Good Remote;   Good  Judgement:  Good  Insight:  Fair  Psychomotor Activity:  Normal  Concentration:  Concentration: Good and Attention Span: Good  Recall:  Good  Fund of Knowledge: Good  Language: Good  Akathisia:  No  Handed:  Right  AIMS (if indicated): not done  Assets:  Communication Skills Desire for Improvement Resilience Social Support Talents/Skills  ADL's:  Intact  Cognition: WNL  Sleep:  Good   Screenings:   Assessment and Plan: This patient is a 64 year old male with a history of bipolar disorder and ADHD.  His mood is stable and his ADHD is under good control.  He will continue Lamictal 200  mg at bedtime for mood stabilization and Vyvanse 70 mg every morning for ADHD.  He will return to see me in 3 months   Philip Spiller, MD 07/13/2020, 9:19 AM

## 2020-07-19 DIAGNOSIS — E7849 Other hyperlipidemia: Secondary | ICD-10-CM | POA: Diagnosis not present

## 2020-07-19 DIAGNOSIS — J449 Chronic obstructive pulmonary disease, unspecified: Secondary | ICD-10-CM | POA: Diagnosis not present

## 2020-07-19 DIAGNOSIS — I10 Essential (primary) hypertension: Secondary | ICD-10-CM | POA: Diagnosis not present

## 2020-07-19 DIAGNOSIS — B370 Candidal stomatitis: Secondary | ICD-10-CM | POA: Diagnosis not present

## 2020-07-19 DIAGNOSIS — Z720 Tobacco use: Secondary | ICD-10-CM | POA: Diagnosis not present

## 2020-07-19 DIAGNOSIS — J029 Acute pharyngitis, unspecified: Secondary | ICD-10-CM | POA: Diagnosis not present

## 2020-07-27 DIAGNOSIS — M48062 Spinal stenosis, lumbar region with neurogenic claudication: Secondary | ICD-10-CM | POA: Diagnosis not present

## 2020-07-27 DIAGNOSIS — Z6830 Body mass index (BMI) 30.0-30.9, adult: Secondary | ICD-10-CM | POA: Diagnosis not present

## 2020-07-27 DIAGNOSIS — I10 Essential (primary) hypertension: Secondary | ICD-10-CM | POA: Diagnosis not present

## 2020-07-27 DIAGNOSIS — M5126 Other intervertebral disc displacement, lumbar region: Secondary | ICD-10-CM | POA: Diagnosis not present

## 2020-07-31 DIAGNOSIS — J069 Acute upper respiratory infection, unspecified: Secondary | ICD-10-CM | POA: Diagnosis not present

## 2020-07-31 DIAGNOSIS — R051 Acute cough: Secondary | ICD-10-CM | POA: Diagnosis not present

## 2020-08-08 ENCOUNTER — Ambulatory Visit (HOSPITAL_COMMUNITY)
Admission: RE | Admit: 2020-08-08 | Discharge: 2020-08-08 | Disposition: A | Discharge status: Home / Self Care | Payer: PPO | Source: Ambulatory Visit | Attending: Internal Medicine | Admitting: Internal Medicine

## 2020-08-08 ENCOUNTER — Other Ambulatory Visit: Payer: Self-pay | Admitting: Physician Assistant

## 2020-08-08 ENCOUNTER — Other Ambulatory Visit (HOSPITAL_COMMUNITY): Payer: Self-pay | Admitting: Internal Medicine

## 2020-08-08 ENCOUNTER — Other Ambulatory Visit (HOSPITAL_COMMUNITY): Payer: Self-pay | Admitting: Neurosurgery

## 2020-08-08 ENCOUNTER — Other Ambulatory Visit: Payer: Self-pay | Admitting: Neurosurgery

## 2020-08-08 ENCOUNTER — Other Ambulatory Visit: Payer: Self-pay

## 2020-08-08 ENCOUNTER — Other Ambulatory Visit (HOSPITAL_COMMUNITY): Payer: Self-pay | Admitting: Physician Assistant

## 2020-08-08 DIAGNOSIS — M545 Low back pain, unspecified: Secondary | ICD-10-CM

## 2020-08-08 DIAGNOSIS — G8929 Other chronic pain: Secondary | ICD-10-CM | POA: Diagnosis not present

## 2020-08-08 DIAGNOSIS — R0602 Shortness of breath: Secondary | ICD-10-CM | POA: Insufficient documentation

## 2020-08-08 DIAGNOSIS — I671 Cerebral aneurysm, nonruptured: Secondary | ICD-10-CM

## 2020-08-08 DIAGNOSIS — R229 Localized swelling, mass and lump, unspecified: Secondary | ICD-10-CM | POA: Diagnosis not present

## 2020-08-08 IMAGING — DX DG CHEST 2V
2 series · 2 of 2 positions shown · non-contrast
Comparison: [DATE].

CLINICAL DATA: Shortness of breath

EXAM:
CHEST - 2 VIEW

[chest pa]
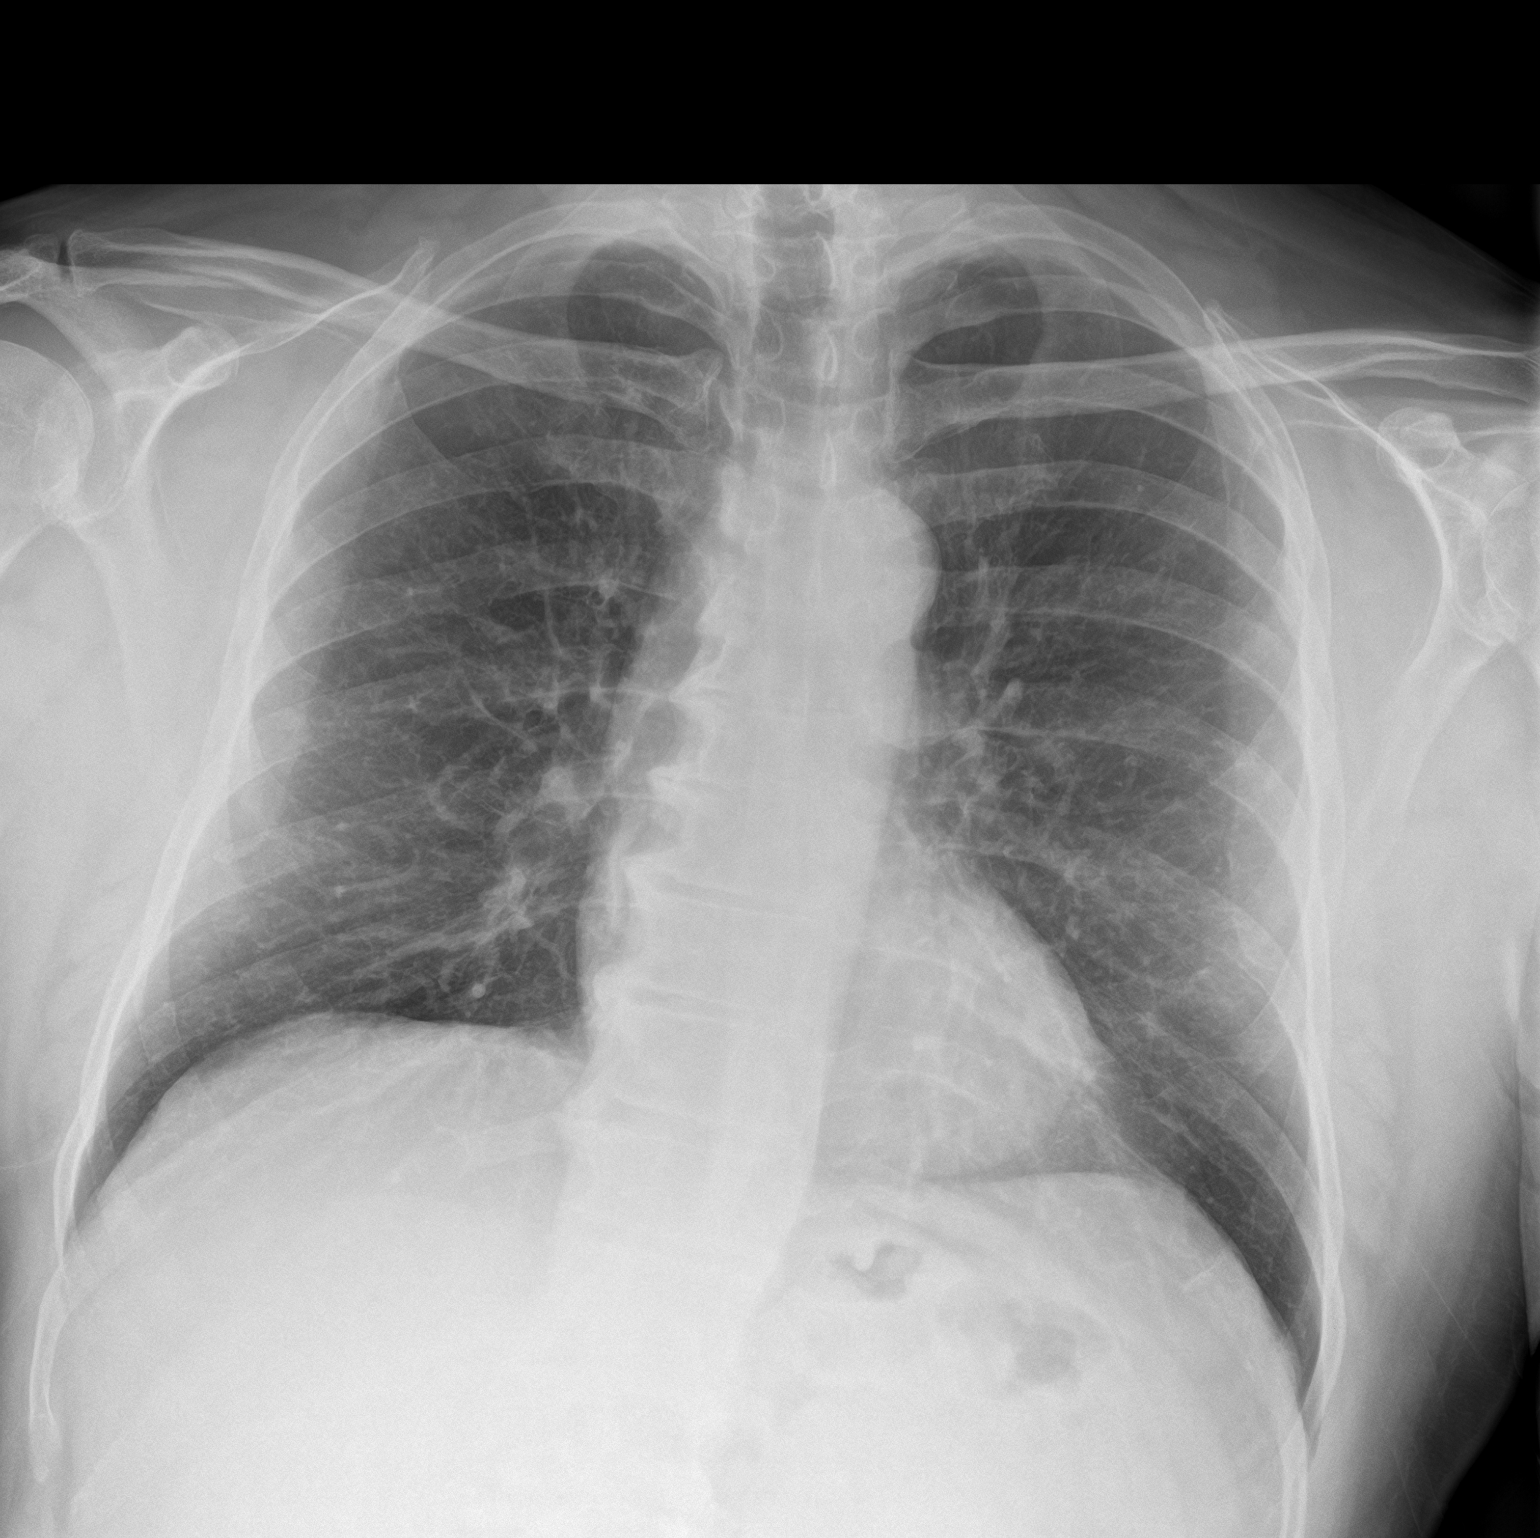

[chest lat]
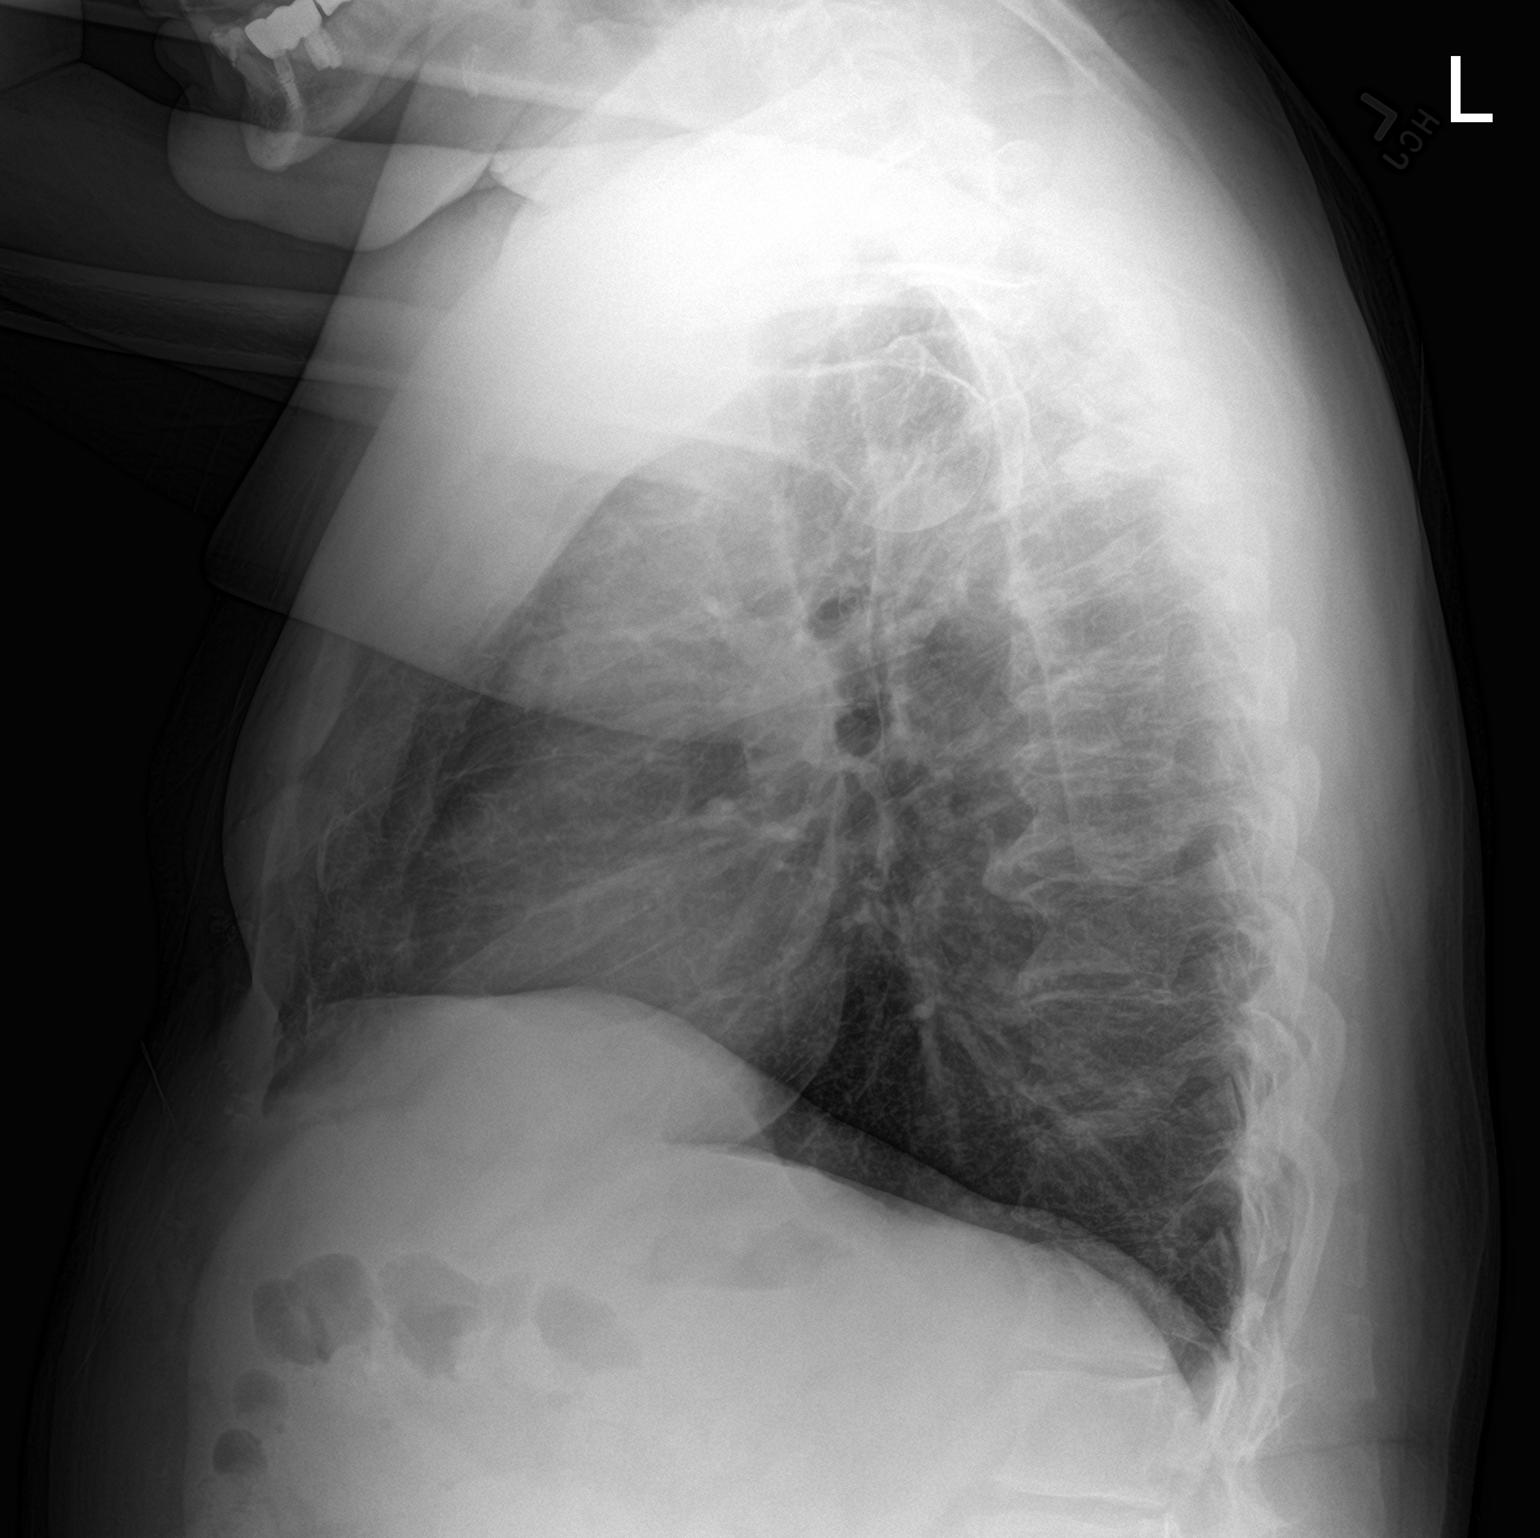

[2 of 2 positions shown; findings below may reference images not displayed]

FINDINGS: Lungs are clear. Heart size and pulmonary vascularity are normal. No
adenopathy. There is degenerative change in the thoracic spine.
IMPRESSION: Lungs clear.  Cardiac silhouette normal.

## 2020-08-17 DIAGNOSIS — H9313 Tinnitus, bilateral: Secondary | ICD-10-CM | POA: Diagnosis not present

## 2020-08-17 DIAGNOSIS — B372 Candidiasis of skin and nail: Secondary | ICD-10-CM | POA: Diagnosis not present

## 2020-08-24 DIAGNOSIS — R059 Cough, unspecified: Secondary | ICD-10-CM | POA: Diagnosis not present

## 2020-08-24 DIAGNOSIS — J069 Acute upper respiratory infection, unspecified: Secondary | ICD-10-CM | POA: Diagnosis not present

## 2020-09-06 ENCOUNTER — Other Ambulatory Visit: Payer: Self-pay

## 2020-09-06 ENCOUNTER — Ambulatory Visit (HOSPITAL_COMMUNITY)
Admission: RE | Admit: 2020-09-06 | Discharge: 2020-09-06 | Disposition: A | Discharge status: Home / Self Care | Payer: PPO | Source: Ambulatory Visit | Attending: Physician Assistant | Admitting: Physician Assistant

## 2020-09-06 ENCOUNTER — Ambulatory Visit (HOSPITAL_COMMUNITY)
Admission: RE | Admit: 2020-09-06 | Discharge: 2020-09-06 | Disposition: A | Discharge status: Home / Self Care | Payer: PPO | Source: Ambulatory Visit | Attending: Neurosurgery | Admitting: Neurosurgery

## 2020-09-06 DIAGNOSIS — G8929 Other chronic pain: Secondary | ICD-10-CM | POA: Diagnosis not present

## 2020-09-06 DIAGNOSIS — M545 Low back pain, unspecified: Secondary | ICD-10-CM | POA: Diagnosis not present

## 2020-09-06 DIAGNOSIS — I671 Cerebral aneurysm, nonruptured: Secondary | ICD-10-CM | POA: Insufficient documentation

## 2020-09-06 DIAGNOSIS — I6601 Occlusion and stenosis of right middle cerebral artery: Secondary | ICD-10-CM | POA: Diagnosis not present

## 2020-09-06 IMAGING — MR MR LUMBAR SPINE W/O CM
4 of 5 series · 31 of 48 positions shown · non-contrast
Comparison: MRI lumbar spine dated [DATE].

CLINICAL DATA: Low back pain and difficulty walking for the past 2
months. No injury or prior surgery.

EXAM:
MRI LUMBAR SPINE WITHOUT CONTRAST
TECHNIQUE: Multiplanar, multisequence MR imaging of the lumbar spine was
performed. No intravenous contrast was administered.

[Series 5: T2 · sagittal · 4.0mm · 0.68mm/px · 7 of 19 slices shown (1 of 2)]
[im 1/19]
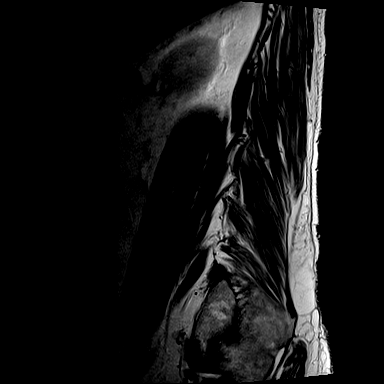
[im 4/19]
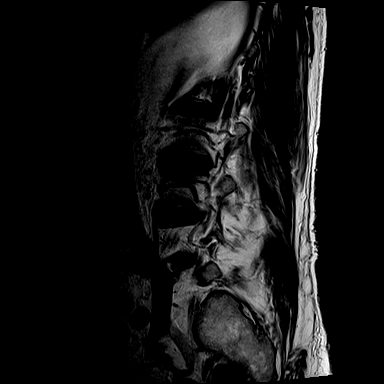
[im 7/19]
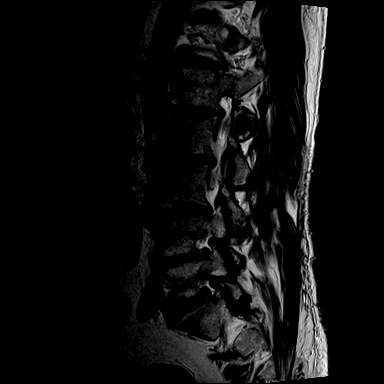
[im 10/19]
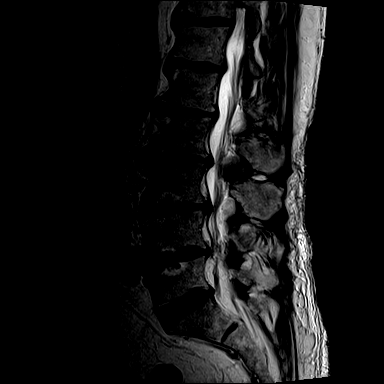
[im 13/19]
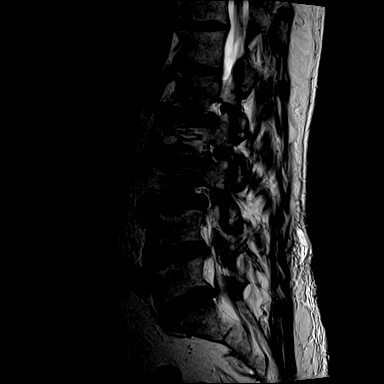
[im 16/19]
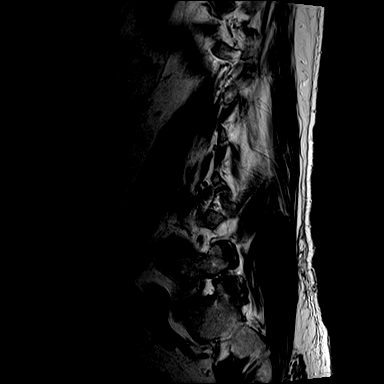
[im 19/19]
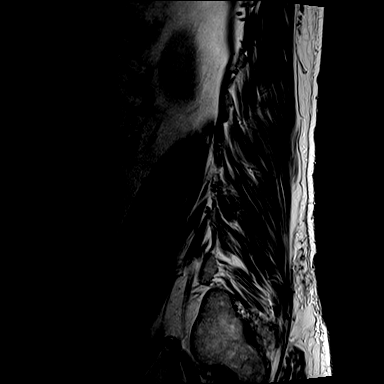

[Series 6: T1 · sagittal · 4.0mm · 0.81mm/px · 7 of 19 slices shown (1 of 2)]
[im 1/19]
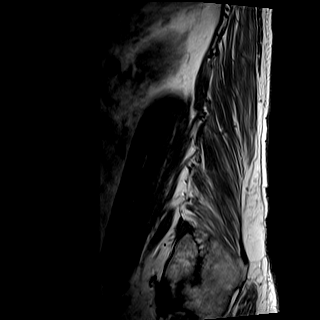
[im 4/19]
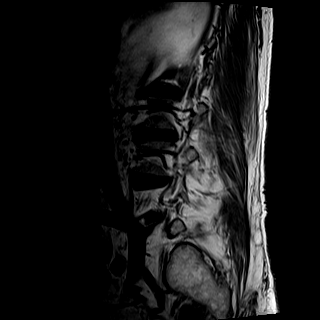
[im 7/19]
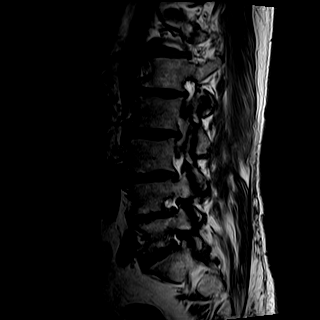
[im 10/19]
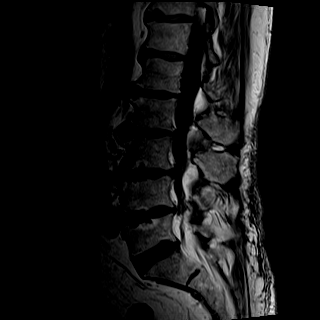
[im 13/19]
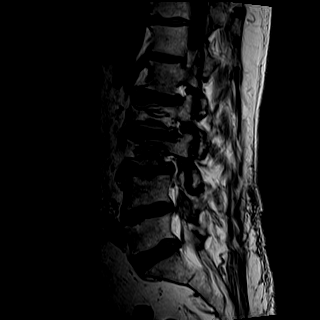
[im 16/19]
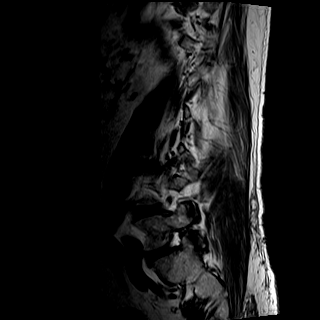
[im 19/19]
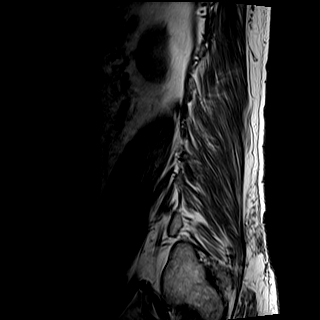

[Series 8: T2 · axial · 4.0mm · 0.70mm/px · z∈[-134,+57]mm · 9 of 31 slices shown (2 of 2)]
[im 1/31]
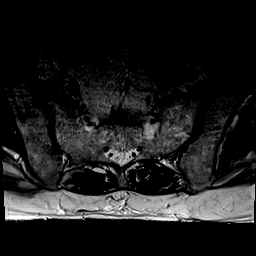
[im 6/31]
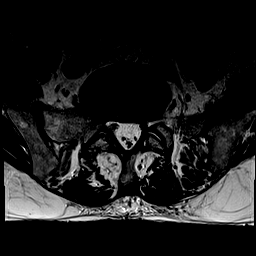
[im 11/31]
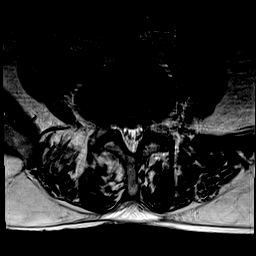
[im 13/31]
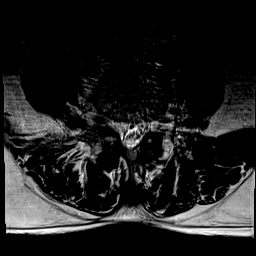
[im 16/31]
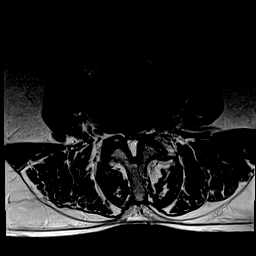
[im 18/31]
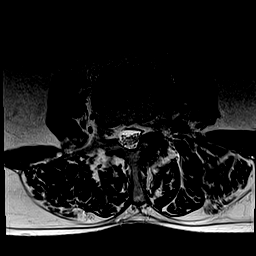
[im 21/31]
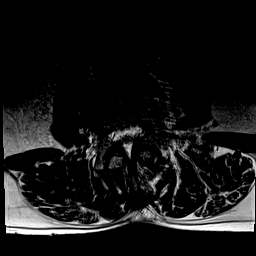
[im 26/31]
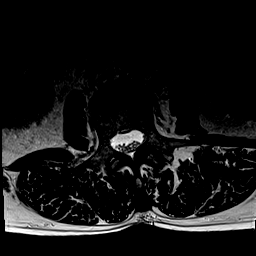
[im 31/31]
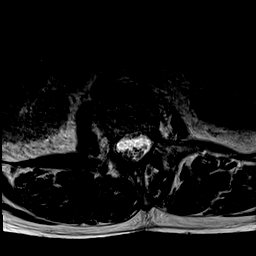

[Series 9: T1 · axial · 4.0mm · 0.35mm/px · z∈[-134,+32]mm · 8 of 31 slices shown (2 of 2)]
[im 1/31]
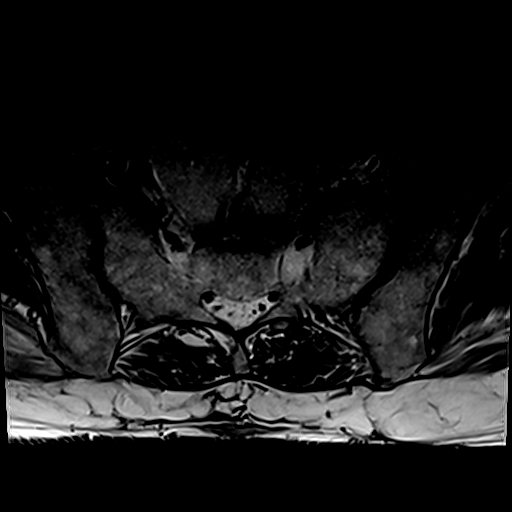
[im 6/31]
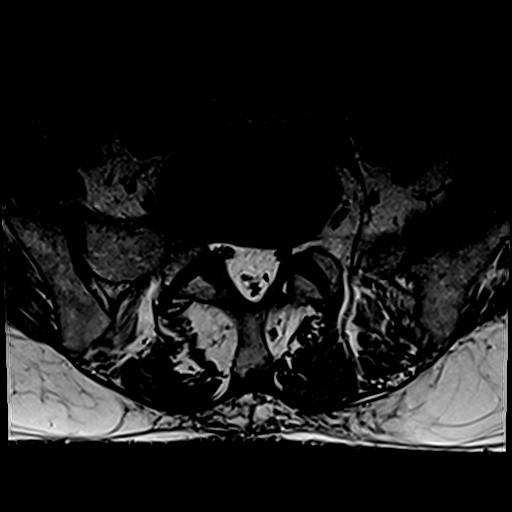
[im 11/31]
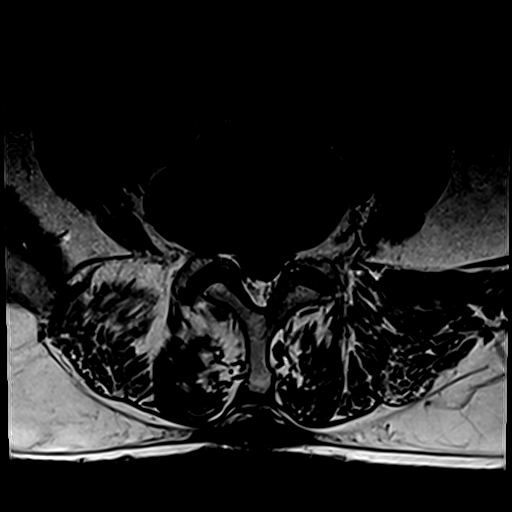
[im 13/31]
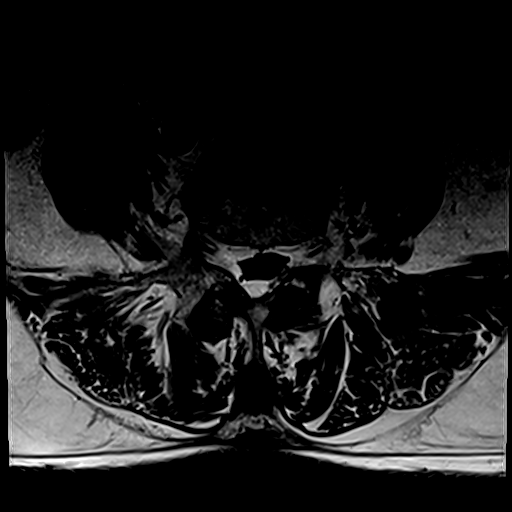
[im 16/31]
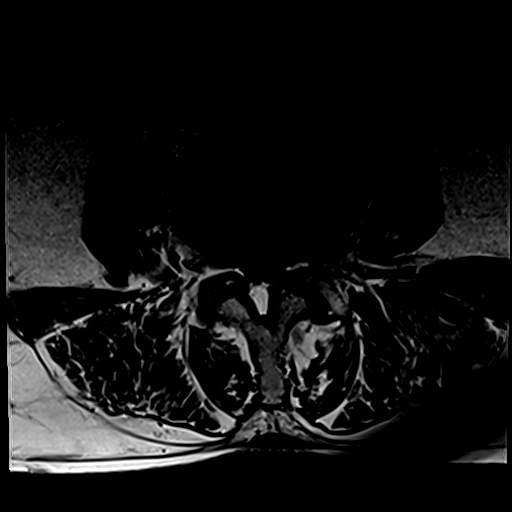
[im 18/31]
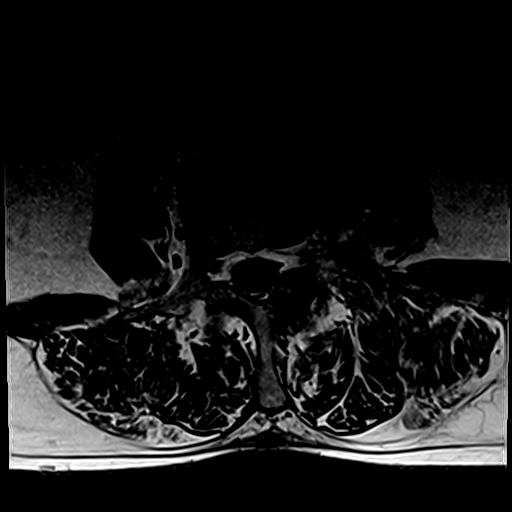
[im 21/31]
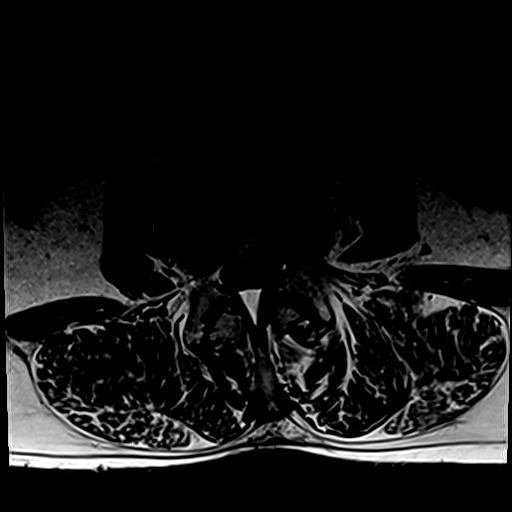
[im 26/31]
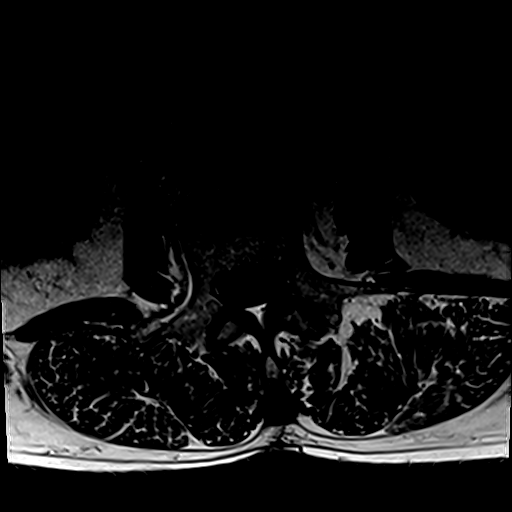

[31 of 48 positions shown; findings below may reference images not displayed]

FINDINGS: Segmentation:  Standard.

Alignment: Unchanged mild dextroscoliosis. No significant listhesis.

Vertebrae:  No fracture, evidence of discitis, or bone lesion.

Conus medullaris and cauda equina: Conus extends to the L1 level.
Conus and cauda equina appear normal.

Paraspinal and other soft tissues: Negative.

Disc levels:

T12-L1:  New mild disc bulging.  No stenosis.

L1-L2: Unchanged mild to moderate disc bulging. Progressive mild
left facet arthropathy. No stenosis.

L2-L3: Progressive moderate disc bulging and bilateral facet
arthropathy. Worsened moderate spinal canal and left lateral recess
stenosis. Unchanged mild left neuroforaminal stenosis. No right
neuroforaminal stenosis.

L3-L4: Unchanged mild disc bulging and mild bilateral facet
arthropathy. Decreased size of the previously seen superimposed left
subarticular disc protrusion. New small central disc protrusion. New
small left extraforaminal disc protrusion contacting the exiting
left L3 nerve root. Improved now moderate left lateral recess
stenosis. Worsened now moderate to severe spinal canal stenosis.
Worsened mild-to-moderate left neuroforaminal stenosis. No right
neuroforaminal stenosis.

L4-L5: Unchanged mild disc bulging and bilateral facet arthropathy.
Unchanged mild spinal canal and right neuroforaminal stenosis. No
left neuroforaminal stenosis.

L5-S1: Progressive broad-based disc protrusion eccentric to the
right. Unchanged moderate right and mild left facet arthropathy. New
mild to moderate right neuroforaminal stenosis. Unchanged mild left
lateral recess stenosis. No spinal canal or left neuroforaminal
stenosis.
IMPRESSION: 1. Progressive multilevel degenerative changes of the lumbar spine
as described above. Worsened now moderate to severe spinal canal
stenosis at L3-L4 with new small left extraforaminal disc protrusion
at this level contacting the exiting left L3 nerve root. Previously
seen left subarticular disc protrusion has decreased in size with
improved now moderate left lateral recess stenosis.
2. Worsened moderate spinal canal and left lateral recess stenosis
at L2-L3.
3. New mild-to-moderate right neuroforaminal stenosis at L5-S1.

## 2020-09-06 IMAGING — MR MR MRA HEAD W/O CM
1 series · 33 of 48 positions shown · non-contrast
Comparison: [DATE].

CLINICAL DATA: Cerebral aneurysm

EXAM:
MRA HEAD WITHOUT CONTRAST
TECHNIQUE: Angiographic images of the Circle of Willis were obtained using MRA
technique without intravenous contrast.

[Series 2: TOF · axial · 0.5mm · 0.54mm/px · z∈[-78,-5]mm · 33 of 168 slices shown]
[im 1/168]
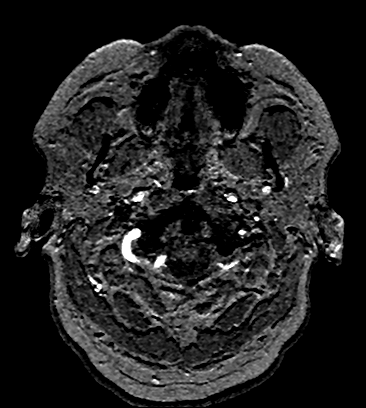
[im 4/168]
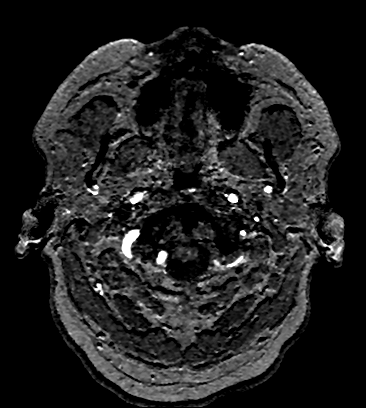
[im 8/168]
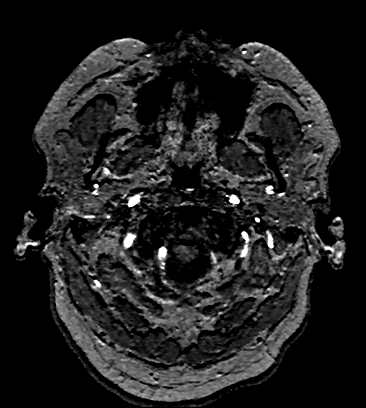
[im 11/168]
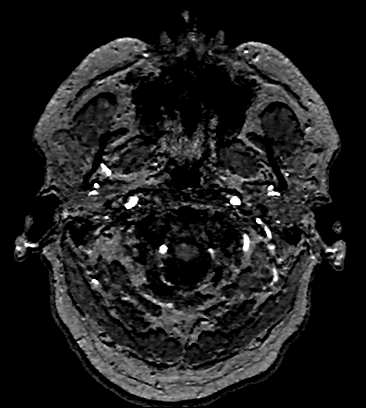
[im 15/168]
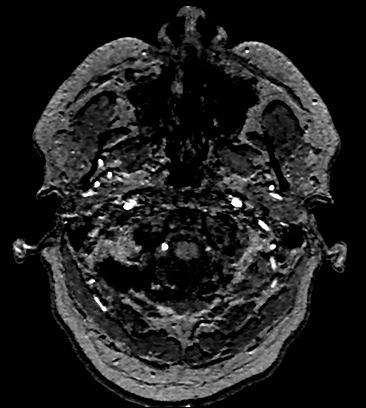
[im 18/168]
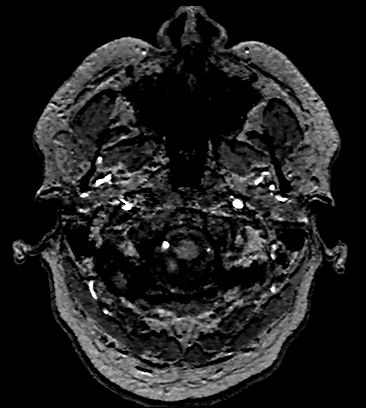
[im 22/168]
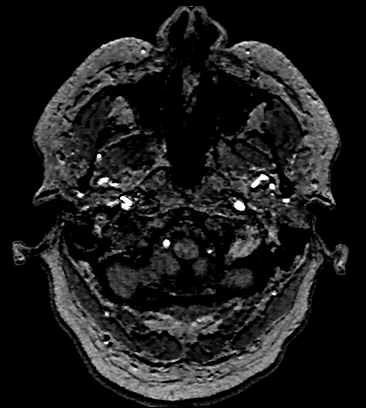
[im 25/168]
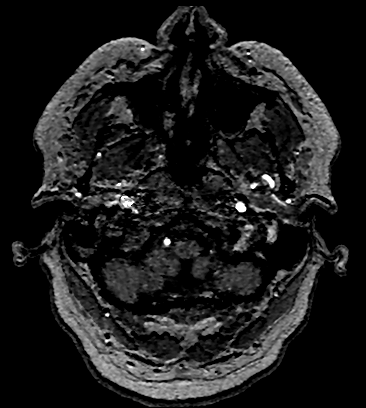
[im 29/168]
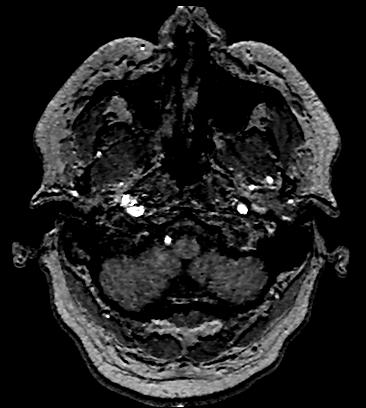
[im 32/168]
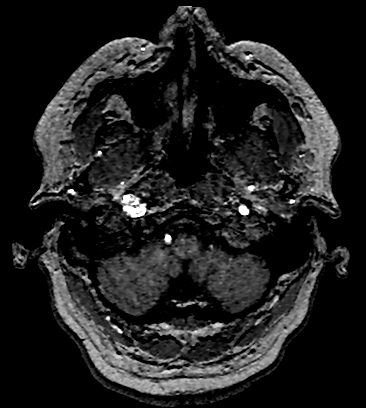
[im 36/168]
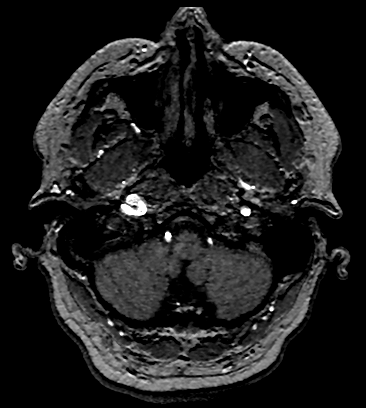
[im 40/168]
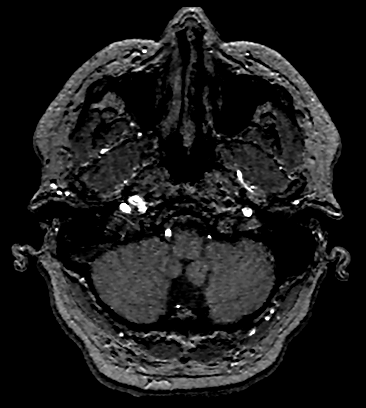
[im 43/168]
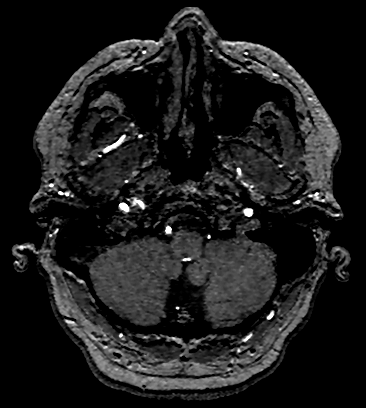
[im 47/168]
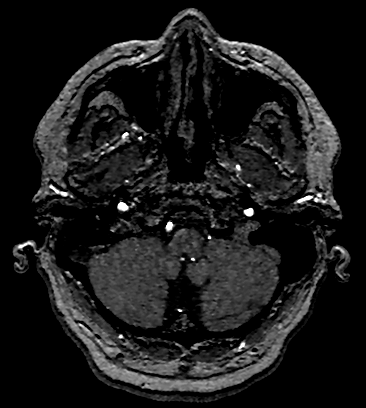
[im 50/168]
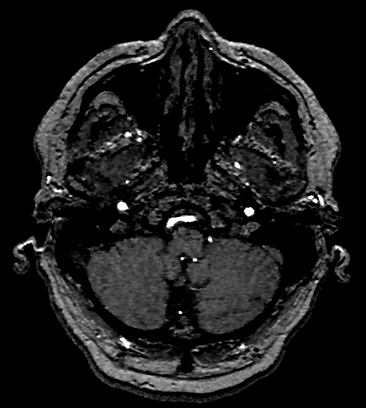
[im 54/168]
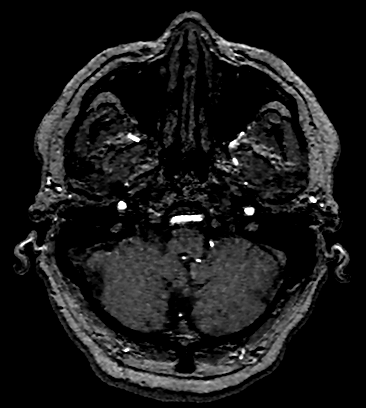
[im 57/168]
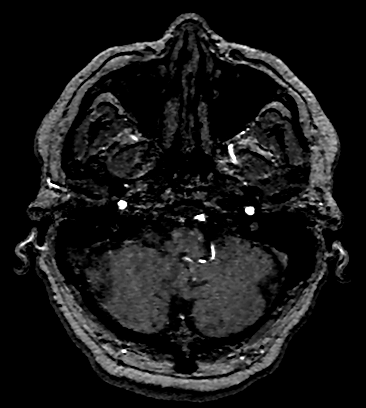
[im 61/168]
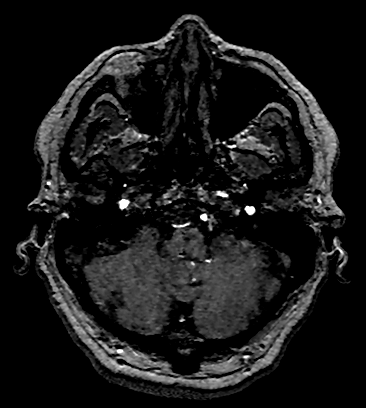
[im 64/168]
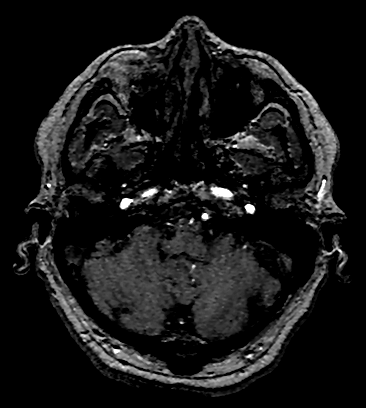
[im 68/168]
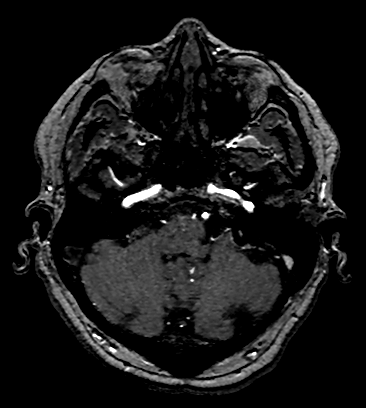
[im 72/168]
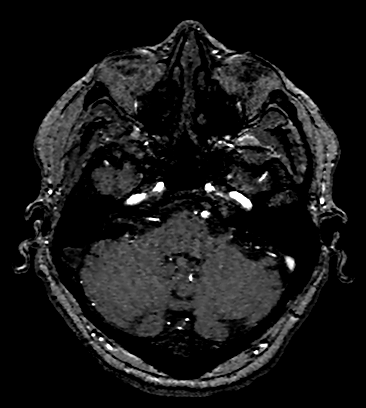
[im 75/168]
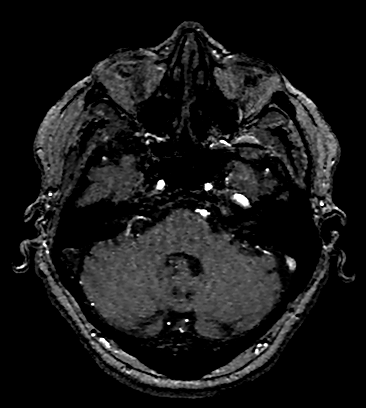
[im 79/168]
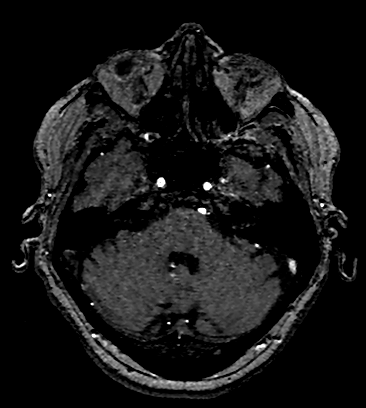
[im 82/168]
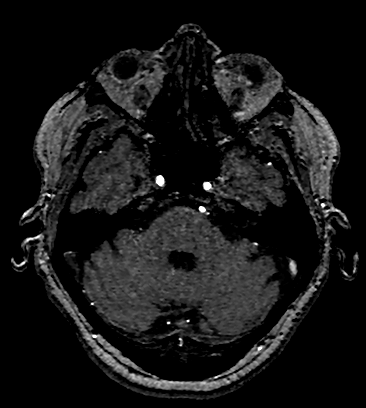
[im 86/168]
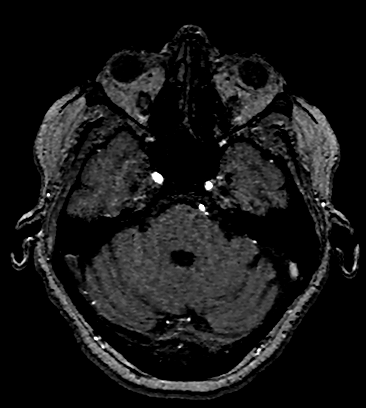
[im 89/168]
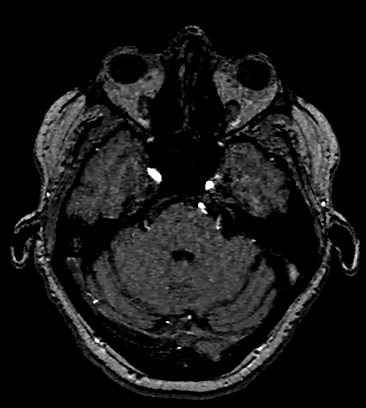
[im 93/168]
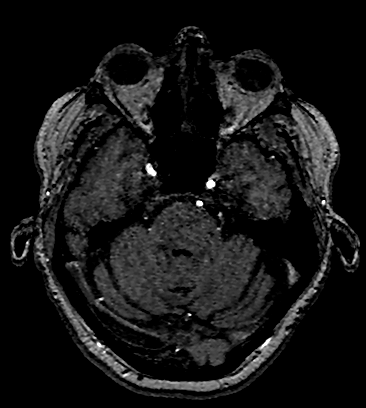
[im 96/168]
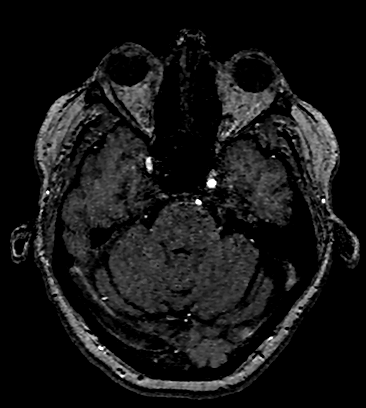
[im 100/168]
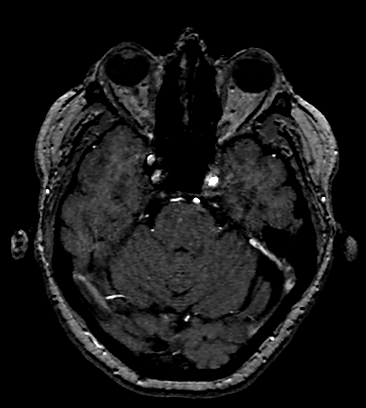
[im 118/168]
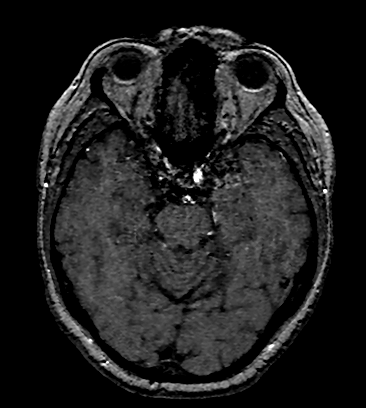
[im 139/168]
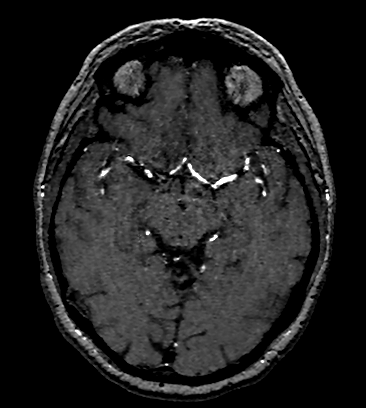
[im 143/168]
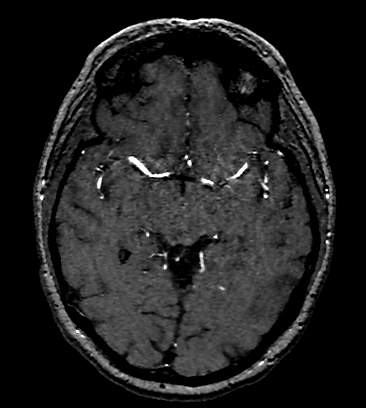
[im 160/168]
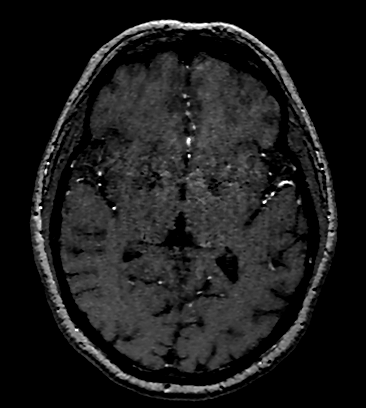

[33 of 48 positions shown; findings below may reference images not displayed]

FINDINGS: Anterior circulation: Patent left ICA with mild narrowing of the
proximal supraclinoid segment. Patent MCAs. Mild narrowing of the
inferior right M2 segment. Patent ACAs.

Sequela of right cavernous/supraclinoid ICA flow diverting stent.
1-2 mm residual flow within the aneurysm neck ([DATE]). Patent right
ICA terminus.

Posterior circulation: Dominant right vertebral artery. Left
vertebral artery terminates as PICA. Moderate to severe narrowing of
the proximal left V4 segment. Patent basilar and superior cerebellar
arteries. Fetal origin of a patent right PCA. Patent left PCA. No
significant stenosis, proximal occlusion, aneurysm, or vascular
malformation.

Venous sinuses: No evidence of thrombosis.

Anatomic variants: Please see above.
IMPRESSION: Sequela of patent right ICA flow diverting stent.

Minimal 1-2 mm residual flow within the aneurysm neck.

## 2020-09-07 DIAGNOSIS — Z23 Encounter for immunization: Secondary | ICD-10-CM | POA: Diagnosis not present

## 2020-09-07 DIAGNOSIS — G629 Polyneuropathy, unspecified: Secondary | ICD-10-CM | POA: Diagnosis not present

## 2020-09-07 DIAGNOSIS — E875 Hyperkalemia: Secondary | ICD-10-CM | POA: Diagnosis not present

## 2020-09-07 DIAGNOSIS — M542 Cervicalgia: Secondary | ICD-10-CM | POA: Diagnosis not present

## 2020-09-07 DIAGNOSIS — E7849 Other hyperlipidemia: Secondary | ICD-10-CM | POA: Diagnosis not present

## 2020-09-07 DIAGNOSIS — J029 Acute pharyngitis, unspecified: Secondary | ICD-10-CM | POA: Diagnosis not present

## 2020-09-10 DIAGNOSIS — R059 Cough, unspecified: Secondary | ICD-10-CM | POA: Diagnosis not present

## 2020-09-10 DIAGNOSIS — J329 Chronic sinusitis, unspecified: Secondary | ICD-10-CM | POA: Diagnosis not present

## 2020-09-10 DIAGNOSIS — R0981 Nasal congestion: Secondary | ICD-10-CM | POA: Diagnosis not present

## 2020-09-25 ENCOUNTER — Telehealth (HOSPITAL_COMMUNITY): Payer: Self-pay | Admitting: Psychiatry

## 2020-09-25 NOTE — Telephone Encounter (Signed)
Called to schedule f/u appt left vm 

## 2020-09-26 ENCOUNTER — Telehealth (HOSPITAL_COMMUNITY): Payer: Self-pay | Admitting: Psychiatry

## 2020-09-26 NOTE — Telephone Encounter (Signed)
Called to schedule f/u appt, left vm 

## 2020-10-06 DIAGNOSIS — J329 Chronic sinusitis, unspecified: Secondary | ICD-10-CM | POA: Diagnosis not present

## 2020-10-06 DIAGNOSIS — K219 Gastro-esophageal reflux disease without esophagitis: Secondary | ICD-10-CM | POA: Diagnosis not present

## 2020-10-06 DIAGNOSIS — R059 Cough, unspecified: Secondary | ICD-10-CM | POA: Diagnosis not present

## 2020-10-06 DIAGNOSIS — M542 Cervicalgia: Secondary | ICD-10-CM | POA: Diagnosis not present

## 2020-10-06 DIAGNOSIS — R0981 Nasal congestion: Secondary | ICD-10-CM | POA: Diagnosis not present

## 2020-10-06 DIAGNOSIS — E875 Hyperkalemia: Secondary | ICD-10-CM | POA: Diagnosis not present

## 2020-10-12 ENCOUNTER — Telehealth (INDEPENDENT_AMBULATORY_CARE_PROVIDER_SITE_OTHER): Payer: PPO | Admitting: Psychiatry

## 2020-10-12 ENCOUNTER — Other Ambulatory Visit: Payer: Self-pay

## 2020-10-12 ENCOUNTER — Encounter (HOSPITAL_COMMUNITY): Payer: Self-pay | Admitting: Psychiatry

## 2020-10-12 DIAGNOSIS — F3162 Bipolar disorder, current episode mixed, moderate: Secondary | ICD-10-CM

## 2020-10-12 DIAGNOSIS — F900 Attention-deficit hyperactivity disorder, predominantly inattentive type: Secondary | ICD-10-CM

## 2020-10-12 MED ORDER — LISDEXAMFETAMINE DIMESYLATE 70 MG PO CAPS
70.0000 mg | ORAL_CAPSULE | Freq: Every day | ORAL | 0 refills | Status: DC
Start: 2020-10-12 — End: 2021-01-10

## 2020-10-12 MED ORDER — LAMOTRIGINE 200 MG PO TABS
200.0000 mg | ORAL_TABLET | Freq: Every day | ORAL | 2 refills | Status: DC
Start: 2020-10-12 — End: 2021-01-10

## 2020-10-12 NOTE — Progress Notes (Signed)
Virtual Visit via Telephone Note  I connected with Philip Gates on 10/12/20 at 10:00 AM EST by telephone and verified that I am speaking with the correct person using two identifiers.  Location: Patient: home Provider: home   I discussed the limitations, risks, security and privacy concerns of performing an evaluation and management service by telephone and the availability of in person appointments. I also discussed with the patient that there may be a patient responsible charge related to this service. The patient expressed understanding and agreed to proceed.     I discussed the assessment and treatment plan with the patient. The patient was provided an opportunity to ask questions and all were answered. The patient agreed with the plan and demonstrated an understanding of the instructions.   The patient was advised to call back or seek an in-person evaluation if the symptoms worsen or if the condition fails to improve as anticipated.  I provided 15 minutes of non-face-to-face time during this encounter.   [name], MD  [facility] MD/PA/NP OP Progress Note  10/12/2020 10:20 AM Philip Gates  MRN:  [id number]  Chief Complaint:  Chief Complaint    ADHD; Manic Behavior; Follow-up     HPI: This patient is a65 year old married white male lives with his wife in [place]. 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 He states that he continues to do well. He does suffer from chronic back pain but the medications such as oxycodone helps. He is focusing well with the Vyvanse and his moods have been stable. He is not been angry or irritable. He is not using drugs or alcohol. He states that he and his wife are getting along well. He is getting plenty of sleep Visit Diagnosis:    ICD-10-CM   1. Attention deficit hyperactivity disorder (ADHD), predominantly inattentive type   F90.0   2. Bipolar 1 disorder, mixed, moderate (HCC)  F31.62     Past Psychiatric History: Past outpatient treatment for bipolar disorder  Past Medical History:  Past Medical History:  Diagnosis Date  . Acid reflux   . 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 Arthritis   . Asthma    ACTIVITY INDUCED  . Bipolar 1 disorder ([place])   . BPH (benign prostatic hyperplasia)   . Cellulitis 05/2014   right side face  . Cerebral aneurysm    has a stent  . GERD (gastroesophageal reflux disease)   . Neck pain, chronic   . Sinus drainage   . 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  . Venous insufficiency     Past Surgical History:  Procedure Laterality Date  . cerebral stent    . COLONOSCOPY N/A 01/02/2017   Procedure: COLONOSCOPY;  Surgeon: Rogene Houston, MD;  [facility];  Service: Endoscopy;  Laterality: N/A;  . dental implant  09/26/2014   left lower dental implant  . ESOPHAGOGASTRODUODENOSCOPY N/A 01/02/2017   Procedure: ESOPHAGOGASTRODUODENOSCOPY (EGD);  Surgeon: Rogene Houston, MD;  [facility];  Service: Endoscopy;  Laterality: N/A;  910  . EYE SURGERY Left Jan. 16, 2017   Cataract  . IR ANGIO INTRA EXTRACRAN SEL INTERNAL CAROTID BILAT MOD SED  01/20/2017  . IR ANGIO VERTEBRAL SEL VERTEBRAL UNI L MOD SED  01/20/2017  .  JOINT REPLACEMENT Right    Knee  . JOINT REPLACEMENT Left    Hip  . NOSE SURGERY    . RADIOLOGY WITH ANESTHESIA N/A 12/29/2014   Procedure: Embolization;  Surgeon: Consuella Lose, MD;  Location: [facility];  Service: Radiology;  Laterality: N/A;  . SUBCLAVIAN ANGIOGRAM  Nov. 8, 2016  . TOTAL HIP ARTHROPLASTY Left 01/03/2013   Procedure: TOTAL HIP ARTHROPLASTY ANTERIOR APPROACH;  Surgeon: Gearlean Alf, MD;  Location: [facility];  Service: Orthopedics;  Laterality: Left;  . TOTAL KNEE ARTHROPLASTY Right 10/06/2014   Procedure: RIGHT TOTAL KNEE ARTHROPLASTY;  Surgeon: Gearlean Alf, MD;  Location: [facility];  Service: Orthopedics;  Laterality: Right;    Family Psychiatric History: see below  Family History:  Family History  Problem Relation Age of Onset  . Arrhythmia Mother        Has pacemaker  . Hypertension Mother   . Heart disease Mother   . Mental illness Mother   . Varicose Veins Mother   . Depression Mother   . Colon cancer Father   . Cancer Father        Prostate and Colon  . Diabetes Father   . Hypertension Father   . Cancer - Colon Father   . Depression Sister     Social History:  Social History   Socioeconomic History  . Marital status: Married    Spouse name: Not on file  . Number of children: Not on file  . Years of education: Not on file  . Highest education level: Not on file  Occupational History  . Not on file  Tobacco Use  . Smoking status: Former Smoker    Packs/day: 0.50    Types: Cigarettes    Start date: 05/23/2015    Quit date: 10/09/2017    Years since quitting: 3.0  . Smokeless tobacco: Former Systems developer  . Tobacco comment: Stated "I smoke off and on"  Substance and Sexual Activity  . Alcohol use: No    Alcohol/week: 0.0 standard drinks    Comment: 10-12-15 per pt no  . Drug use: No    Comment: 10-12-15 per tp no  . Sexual activity: Yes    Partners: Female  Other Topics Concern  . Not on file  Social History Narrative  . Not on file   Social Determinants of Health   Financial Resource Strain: Not on file  Food Insecurity: Not on file  Transportation Needs: Not on file  Physical Activity: Not on file  Stress: Not on file  Social Connections: Not on file    Allergies:  Allergies  Allergen Reactions  . Erythromycin     Caused a "twisted stomach"  . Penicillins Rash    Metabolic Disorder Labs: No results found for: HGBA1C, MPG No results found for: PROLACTIN No results found for: CHOL, TRIG, HDL, CHOLHDL, VLDL, LDLCALC No results found for: TSH  Therapeutic Level Labs: No results found for:  LITHIUM No results found for: VALPROATE No components found for:  CBMZ  Current Medications: Current Outpatient Medications  Medication Sig Dispense Refill  . aspirin 325 MG tablet Take 325 mg by mouth daily.    . cyclobenzaprine (FLEXERIL) 5 MG tablet     . dexlansoprazole (DEXILANT) 60 MG capsule Take 60 mg by mouth every morning.     . Dutasteride-Tamsulosin HCl (JALYN) 0.5-0.4 MG CAPS Take 1 capsule by mouth daily at 12 noon.     . ferrous sulfate 325 (65 FE) MG tablet Take  325 mg by mouth daily with breakfast.    . HYDROcodone-acetaminophen (NORCO) 10-325 MG tablet Take 1 tablet by mouth every 6 (six) hours as needed for moderate pain.    Marland Kitchen ibuprofen (ADVIL,MOTRIN) 200 MG tablet Take 800 mg by mouth every 8 (eight) hours as needed for mild pain or moderate pain.    Marland Kitchen ipratropium (ATROVENT) 0.03 % nasal spray Place 2 sprays into both nostrils every 12 (twelve) hours as needed for rhinitis. Reported on 10/23/2015    . lamoTRIgine (LAMICTAL) 200 MG tablet Take 1 tablet (200 mg total) by mouth at bedtime. 30 tablet 2  . levalbuterol (XOPENEX HFA) 45 MCG/ACT inhaler Inhale 2 puffs into the lungs every 6 (six) hours as needed for wheezing.    Marland Kitchen levocetirizine (XYZAL) 5 MG tablet Take 1 tablet by mouth at bedtime.    Marland Kitchen lisdexamfetamine (VYVANSE) 70 MG capsule Take 1 capsule (70 mg total) by mouth daily. 30 capsule 0  . lisdexamfetamine (VYVANSE) 70 MG capsule Take 1 capsule (70 mg total) by mouth daily. 30 capsule 0  . lisdexamfetamine (VYVANSE) 70 MG capsule Take 1 capsule (70 mg total) by mouth daily. 30 capsule 0  . losartan-hydrochlorothiazide (HYZAAR) 100-25 MG tablet     . montelukast (SINGULAIR) 10 MG tablet Take 10 mg by mouth every morning.     . Multiple Vitamins-Minerals (MULTIVITAMIN WITH MINERALS) tablet Take 1 tablet by mouth daily.    Marland Kitchen neomycin-polymyxin-hydrocortisone (CORTISPORIN) OTIC solution 1-2 drops to toe twice daily after soaking 10 mL 0  .  neomycin-polymyxin-hydrocortisone (CORTISPORIN) OTIC solution Apply 1-2 drops to toe after soaking twice a day 10 mL 0  . Pediatric Multivitamins-Iron (FLINTSTONES PLUS IRON) chewable tablet Chew 1 tablet by mouth 2 (two) times daily.    . predniSONE (DELTASONE) 10 MG tablet     . PROAIR HFA 108 (90 Base) MCG/ACT inhaler     . promethazine (PHENERGAN) 25 MG tablet Take 25 mg by mouth every 6 (six) hours as needed for nausea or vomiting.    . ranitidine (ZANTAC) 150 MG capsule Take 150 mg by mouth daily as needed for heartburn.    . tadalafil (CIALIS) 20 MG tablet Take 20 mg by mouth daily as needed for erectile dysfunction.    . terbinafine (LAMISIL) 250 MG tablet Take 1 tablet (250 mg total) by mouth daily. 90 tablet 0  . testosterone cypionate (DEPOTESTOTERONE CYPIONATE) 200 MG/ML injection Inject 200 mg into the muscle every 14 (fourteen) days.      No current facility-administered medications for this visit.     Musculoskeletal: Strength & Muscle Tone: within normal limits Gait & Station: normal Patient leans: N/A  Psychiatric Specialty Exam: Review of Systems  HENT: Positive for congestion.   Musculoskeletal: Positive for back pain.  All other systems reviewed and are negative.   There were no vitals taken for this visit.There is no height or weight on file to calculate BMI.  General Appearance: NA  Eye Contact:  NA  Speech:  Clear and Coherent  Volume:  Normal  Mood:  Euthymic  Affect:  NA  Thought Process:  Goal Directed  Orientation:  Full (Time, Place, and Person)  Thought Content: WDL   Suicidal Thoughts:  No  Homicidal Thoughts:  No  Memory:  Immediate;   Good Recent;   Good Remote;   Good  Judgement:  Good  Insight:  Fair  Psychomotor Activity:  Normal  Concentration:  Concentration: Good and Attention Span: Good  Recall:  Good  Fund of Knowledge: Good  Language: Good  Akathisia:  No  Handed:  Right  AIMS (if indicated): not done  Assets:  Communication  Skills Desire for Improvement Resilience Social Support Talents/Skills  ADL's:  Intact  Cognition: WNL  Sleep:  Good   Screenings:   Assessment and Plan: This patient is a 65 year old male with a history of bipolar disorder and ADHD. His mood is stable and his ADHD is well controlled. He will continue Lamictal 200 mg at bedtime for mood stabilization and Vyvanse 70 mg every morning for ADHD. He will return to see me in 3 months   [name], MD 10/12/2020, 10:20 AM

## 2020-10-15 DIAGNOSIS — E291 Testicular hypofunction: Secondary | ICD-10-CM | POA: Insufficient documentation

## 2020-10-15 DIAGNOSIS — N400 Enlarged prostate without lower urinary tract symptoms: Secondary | ICD-10-CM | POA: Insufficient documentation

## 2020-10-15 DIAGNOSIS — G894 Chronic pain syndrome: Secondary | ICD-10-CM | POA: Insufficient documentation

## 2020-10-15 DIAGNOSIS — J418 Mixed simple and mucopurulent chronic bronchitis: Secondary | ICD-10-CM | POA: Diagnosis not present

## 2020-10-15 DIAGNOSIS — N529 Male erectile dysfunction, unspecified: Secondary | ICD-10-CM | POA: Insufficient documentation

## 2020-10-15 DIAGNOSIS — M5030 Other cervical disc degeneration, unspecified cervical region: Secondary | ICD-10-CM | POA: Insufficient documentation

## 2020-10-15 DIAGNOSIS — J302 Other seasonal allergic rhinitis: Secondary | ICD-10-CM | POA: Diagnosis not present

## 2020-10-15 DIAGNOSIS — Z9889 Other specified postprocedural states: Secondary | ICD-10-CM | POA: Insufficient documentation

## 2020-10-15 DIAGNOSIS — K219 Gastro-esophageal reflux disease without esophagitis: Secondary | ICD-10-CM | POA: Diagnosis present

## 2020-10-15 DIAGNOSIS — I10 Essential (primary) hypertension: Secondary | ICD-10-CM | POA: Diagnosis not present

## 2020-10-15 DIAGNOSIS — L259 Unspecified contact dermatitis, unspecified cause: Secondary | ICD-10-CM | POA: Diagnosis not present

## 2020-10-15 DIAGNOSIS — Z8679 Personal history of other diseases of the circulatory system: Secondary | ICD-10-CM | POA: Insufficient documentation

## 2020-10-15 DIAGNOSIS — M48061 Spinal stenosis, lumbar region without neurogenic claudication: Secondary | ICD-10-CM | POA: Diagnosis not present

## 2020-10-15 DIAGNOSIS — M5136 Other intervertebral disc degeneration, lumbar region: Secondary | ICD-10-CM | POA: Diagnosis not present

## 2020-10-15 DIAGNOSIS — F3170 Bipolar disorder, currently in remission, most recent episode unspecified: Secondary | ICD-10-CM | POA: Diagnosis present

## 2020-10-17 DIAGNOSIS — M48062 Spinal stenosis, lumbar region with neurogenic claudication: Secondary | ICD-10-CM | POA: Diagnosis not present

## 2020-10-19 DIAGNOSIS — Z01818 Encounter for other preprocedural examination: Secondary | ICD-10-CM | POA: Diagnosis not present

## 2020-10-19 DIAGNOSIS — M48061 Spinal stenosis, lumbar region without neurogenic claudication: Secondary | ICD-10-CM | POA: Diagnosis not present

## 2020-10-19 DIAGNOSIS — Z23 Encounter for immunization: Secondary | ICD-10-CM | POA: Diagnosis not present

## 2020-10-19 DIAGNOSIS — R000 Tachycardia, unspecified: Secondary | ICD-10-CM | POA: Diagnosis not present

## 2020-11-05 DIAGNOSIS — K219 Gastro-esophageal reflux disease without esophagitis: Secondary | ICD-10-CM | POA: Diagnosis not present

## 2020-11-05 DIAGNOSIS — N529 Male erectile dysfunction, unspecified: Secondary | ICD-10-CM | POA: Diagnosis not present

## 2020-11-05 DIAGNOSIS — E875 Hyperkalemia: Secondary | ICD-10-CM | POA: Diagnosis not present

## 2020-11-05 DIAGNOSIS — R41840 Attention and concentration deficit: Secondary | ICD-10-CM | POA: Diagnosis not present

## 2020-11-05 DIAGNOSIS — F3181 Bipolar II disorder: Secondary | ICD-10-CM | POA: Diagnosis not present

## 2020-11-16 ENCOUNTER — Other Ambulatory Visit: Payer: Self-pay | Admitting: Neurosurgery

## 2020-11-16 DIAGNOSIS — J418 Mixed simple and mucopurulent chronic bronchitis: Secondary | ICD-10-CM | POA: Diagnosis not present

## 2020-11-16 DIAGNOSIS — R000 Tachycardia, unspecified: Secondary | ICD-10-CM | POA: Insufficient documentation

## 2020-11-16 DIAGNOSIS — I10 Essential (primary) hypertension: Secondary | ICD-10-CM | POA: Diagnosis not present

## 2020-11-21 DIAGNOSIS — B078 Other viral warts: Secondary | ICD-10-CM | POA: Diagnosis not present

## 2020-11-21 DIAGNOSIS — L814 Other melanin hyperpigmentation: Secondary | ICD-10-CM | POA: Diagnosis not present

## 2020-11-21 DIAGNOSIS — Z85828 Personal history of other malignant neoplasm of skin: Secondary | ICD-10-CM | POA: Diagnosis not present

## 2020-11-21 DIAGNOSIS — L570 Actinic keratosis: Secondary | ICD-10-CM | POA: Diagnosis not present

## 2020-11-21 DIAGNOSIS — D225 Melanocytic nevi of trunk: Secondary | ICD-10-CM | POA: Diagnosis not present

## 2020-11-21 DIAGNOSIS — L821 Other seborrheic keratosis: Secondary | ICD-10-CM | POA: Diagnosis not present

## 2020-12-04 NOTE — Progress Notes (Signed)
Surgical Instructions    Your procedure is scheduled on 12/07/20.  Report to [facility] Main Entrance "A" at 05:30 A.M., then check in with the Admitting office.  Call this number if you have problems the morning of surgery:  [phone]   If you have any questions prior to your surgery date call [phone]: Open Monday-Friday 8am-4pm    Remember:  Do not eat or drink after midnight the night before your surgery     Take these medicines the morning of surgery with A SIP OF WATER  BREZTRI inhaler- bring with you the day of surgery cyclobenzaprine (FLEXERIL)  dexlansoprazole (DEXILANT) Dutasteride-Tamsulosin famotidine (PEPCID)  HYDROcodone-acetaminophen (NORCO) if needed metoprolol succinate (TOPROL-XL) (if not taken at bedtime the day before) montelukast (SINGULAIR) PROAIR HFA  Inhaler if needed promethazine (PHENERGAN) if needed   As of today, STOP taking any Aspirin (unless otherwise instructed by your surgeon) Aleve, Naproxen, Ibuprofen, Motrin, Advil, Goody's, BC's, all herbal medications, fish oil, and all vitamins.                     Do not wear jewelry, make up, or nail polish            Do not wear lotions, powders, perfumes/colognes, or deodorant.            Men may shave face and neck.            Do not bring valuables to the hospital.            [facility] is not responsible for any belongings or valuables.  Do NOT Smoke (Tobacco/Vaping) or drink Alcohol 24 hours prior to your procedure If you use a CPAP at night, you may bring all equipment for your overnight stay.   Contacts, glasses, dentures or bridgework may not be worn into surgery, please bring cases for these belongings   For patients admitted to the hospital, discharge time will be determined by your treatment team.   Patients discharged the day of surgery will not be allowed to drive home, and someone needs to stay with them for 24 hours.    Special instructions:   [facility]- Preparing For  Surgery  Before surgery, you can play an important role. Because skin is not sterile, your skin needs to be as free of germs as possible. You can reduce the number of germs on your skin by washing with CHG (chlorahexidine gluconate) Soap before surgery.  CHG is an antiseptic cleaner which kills germs and bonds with the skin to continue killing germs even after washing.    Oral Hygiene is also important to reduce your risk of infection.  Remember - BRUSH YOUR TEETH THE MORNING OF SURGERY WITH YOUR REGULAR TOOTHPASTE  Please do not use if you have an allergy to CHG or antibacterial soaps. If your skin becomes reddened/irritated stop using the CHG.  Do not shave (including legs and underarms) for at least 48 hours prior to first CHG shower. It is OK to shave your face.  Please follow these instructions carefully.   1. Shower the NIGHT BEFORE SURGERY and the MORNING OF SURGERY  2. If you chose to wash your hair, wash your hair first as usual with your normal shampoo.  3. After you shampoo, rinse your hair and body thoroughly to remove the shampoo.  4. Wash Face and genitals (private parts) with your normal soap.   5.  Shower the NIGHT BEFORE SURGERY and the MORNING OF SURGERY with CHG  Soap.   6. Use CHG Soap as you would any other liquid soap. You can apply CHG directly to the skin and wash gently with a scrungie or a clean washcloth.   7. Apply the CHG Soap to your body ONLY FROM THE NECK DOWN.  Do not use on open wounds or open sores. Avoid contact with your eyes, ears, mouth and genitals (private parts). Wash Face and genitals (private parts)  with your normal soap.   8. Wash thoroughly, paying special attention to the area where your surgery will be performed.  9. Thoroughly rinse your body with warm water from the neck down.  10. DO NOT shower/wash with your normal soap after using and rinsing off the CHG Soap.  11. Pat yourself dry with a CLEAN TOWEL.  12. Wear CLEAN PAJAMAS to bed  the night before surgery  13. Place CLEAN SHEETS on your bed the night before your surgery  14. DO NOT SLEEP WITH PETS.   Day of Surgery: Take a shower with CHG soap.  Wear Clean/Comfortable clothing the morning of surgery Do not apply any deodorants/lotions.   Remember to brush your teeth WITH YOUR REGULAR TOOTHPASTE.   Please read over the following fact sheets that you were given.

## 2020-12-05 ENCOUNTER — Other Ambulatory Visit: Payer: Self-pay

## 2020-12-05 ENCOUNTER — Encounter (HOSPITAL_COMMUNITY): Payer: Self-pay

## 2020-12-05 ENCOUNTER — Encounter (HOSPITAL_COMMUNITY)
Admission: RE | Admit: 2020-12-05 | Discharge: 2020-12-05 | Disposition: A | Discharge status: Home / Self Care | Payer: PPO | Source: Ambulatory Visit | Attending: Neurosurgery | Admitting: Neurosurgery

## 2020-12-05 DIAGNOSIS — M1990 Unspecified osteoarthritis, unspecified site: Secondary | ICD-10-CM | POA: Diagnosis present

## 2020-12-05 DIAGNOSIS — Z6830 Body mass index (BMI) 30.0-30.9, adult: Secondary | ICD-10-CM | POA: Diagnosis not present

## 2020-12-05 DIAGNOSIS — Z96653 Presence of artificial knee joint, bilateral: Secondary | ICD-10-CM | POA: Diagnosis present

## 2020-12-05 DIAGNOSIS — R000 Tachycardia, unspecified: Secondary | ICD-10-CM | POA: Insufficient documentation

## 2020-12-05 DIAGNOSIS — M4326 Fusion of spine, lumbar region: Secondary | ICD-10-CM | POA: Diagnosis not present

## 2020-12-05 DIAGNOSIS — K219 Gastro-esophageal reflux disease without esophagitis: Secondary | ICD-10-CM | POA: Diagnosis present

## 2020-12-05 DIAGNOSIS — F909 Attention-deficit hyperactivity disorder, unspecified type: Secondary | ICD-10-CM | POA: Diagnosis present

## 2020-12-05 DIAGNOSIS — Z01812 Encounter for preprocedural laboratory examination: Secondary | ICD-10-CM | POA: Insufficient documentation

## 2020-12-05 DIAGNOSIS — Z880 Allergy status to penicillin: Secondary | ICD-10-CM | POA: Diagnosis not present

## 2020-12-05 DIAGNOSIS — I10 Essential (primary) hypertension: Secondary | ICD-10-CM | POA: Diagnosis present

## 2020-12-05 DIAGNOSIS — Z20822 Contact with and (suspected) exposure to covid-19: Secondary | ICD-10-CM | POA: Insufficient documentation

## 2020-12-05 DIAGNOSIS — M48062 Spinal stenosis, lumbar region with neurogenic claudication: Secondary | ICD-10-CM | POA: Diagnosis present

## 2020-12-05 DIAGNOSIS — Z87891 Personal history of nicotine dependence: Secondary | ICD-10-CM | POA: Diagnosis not present

## 2020-12-05 DIAGNOSIS — J449 Chronic obstructive pulmonary disease, unspecified: Secondary | ICD-10-CM | POA: Diagnosis present

## 2020-12-05 DIAGNOSIS — E669 Obesity, unspecified: Secondary | ICD-10-CM | POA: Diagnosis present

## 2020-12-05 DIAGNOSIS — Z79899 Other long term (current) drug therapy: Secondary | ICD-10-CM | POA: Diagnosis not present

## 2020-12-05 DIAGNOSIS — M5416 Radiculopathy, lumbar region: Secondary | ICD-10-CM | POA: Diagnosis present

## 2020-12-05 DIAGNOSIS — F319 Bipolar disorder, unspecified: Secondary | ICD-10-CM | POA: Diagnosis present

## 2020-12-05 DIAGNOSIS — J441 Chronic obstructive pulmonary disease with (acute) exacerbation: Secondary | ICD-10-CM | POA: Diagnosis not present

## 2020-12-05 DIAGNOSIS — M48061 Spinal stenosis, lumbar region without neurogenic claudication: Secondary | ICD-10-CM | POA: Diagnosis present

## 2020-12-05 DIAGNOSIS — Z881 Allergy status to other antibiotic agents status: Secondary | ICD-10-CM | POA: Diagnosis not present

## 2020-12-05 DIAGNOSIS — Z7982 Long term (current) use of aspirin: Secondary | ICD-10-CM | POA: Diagnosis not present

## 2020-12-05 DIAGNOSIS — Z981 Arthrodesis status: Secondary | ICD-10-CM | POA: Diagnosis not present

## 2020-12-05 DIAGNOSIS — E782 Mixed hyperlipidemia: Secondary | ICD-10-CM | POA: Diagnosis not present

## 2020-12-05 HISTORY — DX: Dyspnea, unspecified: R06.00

## 2020-12-05 HISTORY — DX: Attention-deficit hyperactivity disorder, unspecified type: F90.9

## 2020-12-05 HISTORY — DX: Unspecified hearing loss, unspecified ear: H91.90

## 2020-12-05 HISTORY — DX: Essential (primary) hypertension: I10

## 2020-12-05 HISTORY — DX: Chronic obstructive pulmonary disease, unspecified: J44.9

## 2020-12-05 LAB — CBC
HCT: 45 % (ref 39.0–52.0)
Hemoglobin: 15.6 g/dL (ref 13.0–17.0)
MCH: 33.7 pg (ref 26.0–34.0)
MCHC: 34.7 g/dL (ref 30.0–36.0)
MCV: 97.2 fL (ref 80.0–100.0)
Platelets: 216 10*3/uL (ref 150–400)
RBC: 4.63 MIL/uL (ref 4.22–5.81)
RDW: 13 % (ref 11.5–15.5)
WBC: 5.7 10*3/uL (ref 4.0–10.5)
nRBC: 0 % (ref 0.0–0.2)

## 2020-12-05 LAB — BASIC METABOLIC PANEL
Anion gap: 6 (ref 5–15)
BUN: 10 mg/dL (ref 8–23)
CO2: 27 mmol/L (ref 22–32)
Calcium: 9.4 mg/dL (ref 8.9–10.3)
Chloride: 104 mmol/L (ref 98–111)
Creatinine, Ser: 0.92 mg/dL (ref 0.61–1.24)
GFR, Estimated: >60 mL/min (ref >60)
Glucose, Bld: 101 mg/dL — ABNORMAL HIGH (ref 70–99)
Potassium: 4.2 mmol/L (ref 3.5–5.1)
Sodium: 137 mmol/L (ref 135–145)

## 2020-12-05 LAB — TYPE AND SCREEN
ABO/RH(D): AB POS
Antibody Screen: NEGATIVE

## 2020-12-05 LAB — SURGICAL PCR SCREEN
MRSA, PCR: NEGATIVE
Staphylococcus aureus: NEGATIVE

## 2020-12-05 LAB — SARS CORONAVIRUS 2 (TAT 6-24 HRS): SARS Coronavirus 2: NEGATIVE

## 2020-12-05 NOTE — Progress Notes (Signed)
Patient denies shortness of breath, fever, cough or chest pain.  PCP - Pablo Lawrence, NP Cardiologist - n/a  Chest x-ray - 08/08/20 (2V) EKG - 10/19/20 Stress Test - n/a ECHO - n/a Cardiac Cath - n/a  Aspirin and Ibuprofen Instructions: Last dose was on 11/30/20 per patient.  Anesthesia review: Yes, Jeneen Rinks, PA.  Jeneen Rinks made aware of patient's blood pressure readings at PAT appt.  STOP now taking any Aspirin (unless otherwise instructed by your surgeon), Aleve, Naproxen, Ibuprofen, Motrin, Advil, Goody's, BC's, all herbal medications, fish oil, and all vitamins.   Coronavirus Screening Covid test at PAT appt Do you have any of the following symptoms:  Cough yes/no: No Fever (>100.60F)  yes/no: No Runny nose yes/no: No Sore throat yes/no: No Difficulty breathing/shortness of breath  yes/no: No  Have you traveled in the last 14 days and where? yes/no: No  Patient verbalized understanding of instructions that were given via phone.

## 2020-12-06 NOTE — H&P (Signed)
Chief Complaint   No chief complaint on file.   HPI   HPI: Philip Gates is a 65 y.o. male with chronic, progressively worsening low back pain and bilateral leg pain.  Although somewhat difficult to obtain an accurate history, he reports severe low back pain with pain radiating through both his legs whenever standing for more than a couple of minutes, or while walking.  He also reports significant back pain when he tries to bend over. 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 He has failed reasonable conservative treatment including p.o. medications and epidural steroid injections.  He presents today for lumbar decompression and fusion at L3-4.  He is without any concerns.   Patient Active Problem List   Diagnosis Date Noted  . Family hx of colon cancer 11/13/2016  . Absolute anemia 11/13/2016  . Varicose veins of right lower extremity with complications 95/05/3266  . Bipolar 1 disorder, mixed, moderate ([place]) 10/12/2015  . Attention deficit hyperactivity disorder (ADHD) 10/12/2015  . Chronic venous insufficiency 06/25/2015  . SBO (small bowel obstruction) ([place]) 01/16/2015  . Cerebral aneurysm 12/29/2014  . OA (osteoarthritis) of knee 10/06/2014  . Facial cellulitis 05/31/2014  . Asthma   . GERD (gastroesophageal reflux disease)   . Bipolar 1 disorder ([place])   . OA (osteoarthritis) of hip 01/03/2013    PMH: Past Medical History:  Diagnosis Date  . Acid reflux   . ADHD   . 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 Arthritis   . Asthma    ACTIVITY INDUCED  . Bipolar 1 disorder ([place])   . BPH (benign prostatic hyperplasia)   . Cellulitis 05/2014   right side face  . Cerebral aneurysm    stent placement  . COPD (chronic obstructive pulmonary disease) ([place])   . Dyspnea    occaional with exertion  . GERD (gastroesophageal reflux disease)   . Hearing loss     bilateral - no hearing aids  . Hypertension   . Neck pain, chronic   . Sinus drainage   . 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  . Venous insufficiency     PSH: Past Surgical History:  Procedure Laterality Date  . cerebral stent    . COLONOSCOPY N/A 01/02/2017   Procedure: COLONOSCOPY;  Surgeon: Rogene Houston, MD;  [facility];  Service: Endoscopy;  Laterality: N/A;  . dental implant  09/26/2014   upper and lower dental implant/bridges- permanent  . ESOPHAGOGASTRODUODENOSCOPY N/A 01/02/2017   Procedure: ESOPHAGOGASTRODUODENOSCOPY (EGD);  Surgeon: Rogene Houston, MD;  [facility];  Service: Endoscopy;  Laterality: N/A;  910  . EYE SURGERY Left Jan. 16, 2017   Cataract  . IR ANGIO INTRA EXTRACRAN SEL INTERNAL CAROTID BILAT MOD SED  01/20/2017  . IR ANGIO VERTEBRAL SEL VERTEBRAL UNI L MOD SED  01/20/2017  . JOINT REPLACEMENT Right    Knee  . JOINT REPLACEMENT Left    Hip  . NOSE SURGERY    . RADIOLOGY WITH ANESTHESIA N/A 12/29/2014   Procedure: Embolization;  Surgeon: Consuella Lose, MD;  Location: [facility];  Service: Radiology;  Laterality: N/A;  . SUBCLAVIAN ANGIOGRAM  Nov. 8, 2016  . TOTAL HIP ARTHROPLASTY Left 01/03/2013   Procedure: TOTAL HIP ARTHROPLASTY ANTERIOR APPROACH;  Surgeon: Gearlean Alf, MD;  Location: [facility];  Service: Orthopedics;  Laterality: Left;  . TOTAL KNEE ARTHROPLASTY Right 10/06/2014   Procedure: RIGHT TOTAL KNEE ARTHROPLASTY;  Surgeon: Gearlean Alf, MD;  Location: [facility];  Service: Orthopedics;  Laterality: Right;  . WISDOM TOOTH EXTRACTION      No medications prior to admission.    SH: Social History   Tobacco Use  . Smoking status: Former Smoker    Packs/day: 0.50    Types: Cigarettes    Start date: 05/23/2015    Quit date: 10/09/2017    Years since quitting: 3.1  . Smokeless tobacco: Former Systems developer    Types: Snuff  . Tobacco comment: Stated "I smoke off and on"  Vaping Use  .  Vaping Use: Never used  Substance Use Topics  . Alcohol use: No    Alcohol/week: 0.0 standard drinks    Comment: 10-12-15 per pt no  . Drug use: No    Comment: 10-12-15 per tp no    MEDS: Prior to Admission medications   Medication Sig Start Date End Date Taking? Authorizing Provider  aspirin EC 81 MG tablet Take 81 mg by mouth in the morning. Swallow whole.   Yes [provider]  BLACK ELDERBERRY PO Take 1 capsule by mouth in the morning and at bedtime.   Yes [provider]  BREZTRI AEROSPHERE 160-9-4.8 MCG/ACT AERO Inhale 1 puff into the lungs in the morning and at bedtime. 11/16/20  Yes [provider]  cyclobenzaprine (FLEXERIL) 5 MG tablet Take 5 mg by mouth 3 (three) times daily as needed for muscle spasms. 10/26/17  Yes [provider]  dexlansoprazole (DEXILANT) 60 MG capsule Take 60 mg by mouth every morning.    Yes [provider]  Dutasteride-Tamsulosin HCl 0.5-0.4 MG CAPS Take 1 capsule by mouth in the morning.   Yes [provider]  famotidine (PEPCID) 20 MG tablet Take 20 mg by mouth in the morning and at bedtime. 11/06/20  Yes [provider]  Homeopathic Products (MUSCLE CRAMP COMPLEX PO) Take 1 capsule by mouth in the morning and at bedtime. Cramp Defense Magnesium for Leg Cramps, Muscle Cramps & Muscle Spasms.   Yes [provider]  HYDROcodone-acetaminophen (NORCO) 10-325 MG tablet Take 1 tablet by mouth every 6 (six) hours as needed for moderate pain.   Yes [provider]  IBU 800 MG tablet Take 800 mg by mouth every 8 (eight) hours as needed (pain). 11/16/20  Yes [provider]  lamoTRIgine (LAMICTAL) 200 MG tablet Take 1 tablet (200 mg total) by mouth at bedtime. 10/12/20  Yes Cloria Spring, MD  levocetirizine (XYZAL) 5 MG tablet Take 5 mg by mouth at bedtime. 11/10/16  Yes [provider]  lisdexamfetamine (VYVANSE) 70 MG capsule Take 1 capsule (70 mg total) by mouth daily. 10/12/20   Yes Cloria Spring, MD  metoprolol succinate (TOPROL-XL) 50 MG 24 hr tablet Take 50 mg by mouth at bedtime. 11/16/20  Yes [provider]  Misc Natural Product Nasal (NASAL CLEANSE RINSE MIX) PACK Place 1 Dose into the nose daily. W/Azithromycin   Yes [provider]  Misc Natural Products (PROSTATE HEALTH) CAPS Take 1 capsule by mouth in the morning and at bedtime.   Yes [provider]  mometasone (ELOCON) 0.1 % cream Apply 1 application topically daily as needed (skin irritation). 10/15/20  Yes [provider]  montelukast (SINGULAIR) 10 MG tablet Take 10 mg by mouth every morning.   Yes [provider]  Multiple Vitamins-Minerals (  MULTIVITAMIN WITH MINERALS) tablet Take 1 tablet by mouth daily. Multivitamin for Women (needs the iron)   Yes [provider]  OVER THE COUNTER MEDICATION Take 1 capsule by mouth in the morning. Ketones BHB   Yes [provider]  PROAIR HFA 108 (90 Base) MCG/ACT inhaler Inhale 1-2 puffs into the lungs every 6 (six) hours as needed for shortness of breath or wheezing. 10/21/17  Yes [provider]  Probiotic Product (DIGESTIVE ADV MULTI-STRAIN PO) Take 1 capsule by mouth in the morning and at bedtime.   Yes [provider]  promethazine (PHENERGAN) 25 MG tablet Take 25 mg by mouth every 6 (six) hours as needed for nausea or vomiting.   Yes [provider]  tadalafil (CIALIS) 20 MG tablet Take 20 mg by mouth at bedtime as needed for erectile dysfunction.   Yes [provider]  telmisartan (MICARDIS) 40 MG tablet Take 40 mg by mouth in the morning. 09/14/20  Yes [provider]  testosterone cypionate (DEPOTESTOTERONE CYPIONATE) 200 MG/ML injection Inject 200 mg into the muscle every 14 (fourteen) days.  12/16/14  Yes [provider]  Ascorbic Acid (VITAMIN C) 1000 MG tablet Take 1,000 mg by mouth in the morning and at bedtime.    [provider]   lisdexamfetamine (VYVANSE) 70 MG capsule Take 1 capsule (70 mg total) by mouth daily. Patient not taking: Reported on 11/27/2020 10/12/20   Cloria Spring, MD  lisdexamfetamine (VYVANSE) 70 MG capsule Take 1 capsule (70 mg total) by mouth daily. Patient not taking: Reported on 11/27/2020 10/12/20   Cloria Spring, MD    ALLERGY: Allergies  Allergen Reactions  . Cleocin [Clindamycin] Other (See Comments)    Abdominal pain "twisted stomach"  . Erythromycin     Caused a "twisted stomach"  . Penicillins Rash    Social History   Tobacco Use  . Smoking status: Former Smoker    Packs/day: 0.50    Types: Cigarettes    Start date: 05/23/2015    Quit date: 10/09/2017    Years since quitting: 3.1  . Smokeless tobacco: Former Systems developer    Types: Snuff  . Tobacco comment: Stated "I smoke off and on"  Substance Use Topics  . Alcohol use: No    Alcohol/week: 0.0 standard drinks    Comment: 10-12-15 per pt no     Family History  Problem Relation Age of Onset  . Arrhythmia Mother        Has pacemaker  . Hypertension Mother   . Heart disease Mother   . Mental illness Mother   . Varicose Veins Mother   . Depression Mother   . Colon cancer Father   . Cancer Father        Prostate and Colon  . Diabetes Father   . Hypertension Father   . Cancer - Colon Father   . Depression Sister      ROS   Review of Systems  All other systems reviewed and are negative.   Exam   There were no vitals filed for this visit. General appearance: WDWN, NAD Eyes: No scleral injection Cardiovascular: Regular rate and rhythm without murmurs, rubs, gallops. No edema or variciosities. Distal pulses normal. Pulmonary: Effort normal, non-labored breathing Musculoskeletal:     Muscle tone upper extremities: Normal    Muscle tone lower extremities: Normal    Motor exam: Upper Extremities Deltoid Bicep Tricep Grip  Right 5/5 5/5 5/5 5/5  Left 5/5 5/5 5/5 5/5  Lower Extremity IP Quad PF DF EHL  Right 5/5 5/5  5/5 5/5 5/5  Left 5/5 5/5 5/5 5/5 5/5   Neurological Mental Status:    - Patient is awake, alert, oriented to person, place, month, year, and situation    - Patient is able to give a clear and coherent history.    - No signs of aphasia or neglect Cranial Nerves    - II: Visual Fields are full. PERRL    - III/IV/VI: EOMI without ptosis or diploplia.     - V: Facial sensation is grossly normal    - VII: Facial movement is symmetric.     - VIII: hearing is intact to voice    - X: Uvula elevates symmetrically    - XI: Shoulder shrug is symmetric.    - XII: tongue is midline without atrophy or fasciculations.  Sensory: Sensation grossly intact to LT  Results - Imaging/Labs   Results for orders placed or performed during the hospital encounter of 12/05/20 (from the past 48 hour(s))  CBC     Status: None   Collection Time: 12/05/20 10:53 AM  Result Value Ref Range   WBC 5.7 4.0 - 10.5 K/uL   RBC 4.63 4.22 - 5.81 MIL/uL   Hemoglobin 15.6 13.0 - 17.0 g/dL   HCT 45.0 39.0 - 52.0 %   MCV 97.2 80.0 - 100.0 fL   MCH 33.7 26.0 - 34.0 pg   MCHC 34.7 30.0 - 36.0 g/dL   RDW 13.0 11.5 - 15.5 %   Platelets 216 150 - 400 K/uL   nRBC 0.0 0.0 - 0.2 %    Comment: Performed at [facility], [place] [address] [place], [place] [address]  Basic metabolic panel     Status: Abnormal   Collection Time: 12/05/20 10:53 AM  Result Value Ref Range   Sodium 137 135 - 145 mmol/L   Potassium 4.2 3.5 - 5.1 mmol/L   Chloride 104 98 - 111 mmol/L   CO2 27 22 - 32 mmol/L   Glucose, Bld 101 (H) 70 - 99 mg/dL    Comment: Glucose reference range applies only to samples taken after fasting for at least 8 hours.   BUN 10 8 - 23 mg/dL   Creatinine, Ser 0.92 0.61 - 1.24 mg/dL   Calcium 9.4 8.9 - 10.3 mg/dL   GFR, Estimated >60 >60 mL/min    Comment: (NOTE) Calculated using the CKD-EPI Creatinine Equation (2021)    Anion gap 6 5 - 15    Comment: Performed at [facility] [address],  [place], [place] [address]  Surgical pcr screen     Status: None   Collection Time: 12/05/20 10:59 AM   Specimen: Nasal Mucosa; Nasal Swab  Result Value Ref Range   MRSA, PCR NEGATIVE NEGATIVE   Staphylococcus aureus NEGATIVE NEGATIVE    Comment: (NOTE) The Xpert SA Assay (FDA approved for NASAL specimens in patients 69 years of age and older), is one component of a comprehensive surveillance program. It is not intended to diagnose infection nor to guide or monitor treatment. Performed at [facility], [place] [address] [place], [place] [address]   SARS CORONAVIRUS 2 (TAT 6-24 HRS) Nasopharyngeal Nasopharyngeal Swab     Status: None   Collection Time: 12/05/20 11:01 AM   Specimen: Nasopharyngeal Swab  Result Value Ref Range   SARS Coronavirus 2 NEGATIVE NEGATIVE    Comment: (NOTE) SARS-CoV-2 target nucleic acids are NOT DETECTED.  The SARS-CoV-2 RNA is generally detectable in upper and lower respiratory specimens during the acute phase of infection. Negative results do not preclude SARS-CoV-2 infection, do not rule out co-infections with other pathogens, and should not be used as the sole basis for treatment or other patient management decisions. Negative results must be combined with clinical observations, patient history, and epidemiological information. The expected result is Negative.  Fact Sheet for Patients: SugarRoll.be  Fact Sheet for Healthcare Providers: https://www.woods-mathews.com/  This test is not yet approved or cleared by the [place] FDA and  has been authorized for detection and/or diagnosis of SARS-CoV-2 by FDA under an Emergency Use Authorization (EUA). This EUA will remain  in effect (meaning this test can be used) for the duration of the COVID-19 declaration under Se ction 564(b)(1) of the Act, 21 U.S.C. section 360bbb-3(b)(1), unless the authorization is terminated or revoked sooner.  Performed at [facility], [place] [address] [place], [place] [address]   Type and screen     Status: None   Collection Time: 12/05/20 11:05 AM  Result Value Ref Range   ABO/RH(D) AB POS    Antibody Screen NEG    Sample Expiration 12/19/2020,2359    Extend sample reason      NO TRANSFUSIONS OR PREGNANCY IN THE PAST 3 MONTHS Performed at [facility], [place] [address] [place], [place] [address]     No results found.  IMAGING: MRI of the lumbar spine dated 09/06/2020 was personally reviewed.  This demonstrates slight straightening of the normal lumbar lordosis.  There is disc degenerative disease with reactive endplate changes most prominent at L3-4 and L4-5.  There is broad-based disc bulge at L3-4 with mild facet arthropathy and some ligamentous hypertrophy as well as what appears to be a component of epidural lipomatosis which contribute to moderate to severe central stenosis and right greater than left lateral recess/foraminal stenosis.  MRA of the brain without contrast on the same date was also personally reviewed and again demonstrates approximately 1-2 mm right ICA aneurysm residual.  This appears to be stable in comparison to prior angiogram.    Impression/Plan   65 y.o. male with symptoms consistent back pain with neurogenic claudication likely related to multifactorial stenosis worse at L3-4.  Patient appears to have failed reasonable conservative treatment at this point.   We will proceed with lumbar decompression fusion at L3-4.  We have reviewed the indications for surgery, the associated risks, benefits and alternatives at length in the office.  All questions today were answered and consent was obtained.  Consuella Lose, MD [facility]

## 2020-12-06 NOTE — Anesthesia Preprocedure Evaluation (Addendum)
Anesthesia Evaluation  Patient identified by MRN, date of birth, ID band Patient awake    Reviewed: Allergy & Precautions, NPO status , Patient's Chart, lab work & pertinent test results, reviewed documented beta blocker date and time   History of Anesthesia Complications Negative for: history of anesthetic complications  Airway Mallampati: II  TM Distance: >3 FB Neck ROM: Full    Dental  (+) Teeth Intact, Dental Advisory Given Upper permanent bridge, 6 implants :   Pulmonary asthma , COPD,  COPD inhaler, former smoker,    Pulmonary exam normal breath sounds clear to auscultation       Cardiovascular hypertension, Pt. on medications and Pt. on home beta blockers (-) angina(-) Past MI Normal cardiovascular exam Rhythm:Regular Rate:Normal     Neuro/Psych PSYCHIATRIC DISORDERS Bipolar Disorder History of R ICA aneurysm s/p pipeline embolization by Dr. Nundkumar 2016  [place]    GI/Hepatic Neg liver ROS, GERD  Medicated,  Endo/Other  Obesity   Renal/GU negative Renal ROS     Musculoskeletal  (+) Arthritis ,   Abdominal   Peds  (+) ADHD Hematology negative hematology ROS (+)   Anesthesia Other Findings   Reproductive/Obstetrics                           Anesthesia Physical Anesthesia Plan  ASA: III  Anesthesia Plan: General   Post-op Pain Management:    Induction: Intravenous  PONV Risk Score and Plan: 2 and Midazolam, Dexamethasone and Ondansetron  Airway Management Planned: Oral ETT  Additional Equipment:   Intra-op Plan:   Post-operative Plan: Extubation in OR  Informed Consent: I have reviewed the patients History and Physical, chart, labs and discussed the procedure including the risks, benefits and alternatives for the proposed anesthesia with the patient or authorized representative who has indicated his/her understanding and  acceptance.     Dental advisory given  Plan Discussed with: CRNA  Anesthesia Plan Comments: (: Patient has a history of hypertension and resting tachycardia that are followed by PCP Pablo Lawrence, NP.  He was previously cleared for surgery per office visit note 10/19/2020, however he has continued to struggle with uncontrolled hypertension.  He reports he is currently taking Toprol-XL 50 mg/day and telmisartan 40 mg/day.  His blood pressure upon arrival to preadmission testing appointment was 180/104 and 172/109 on recheck.  Heart rate 111.  He reports that these numbers are similar to the numbers he has been getting when checking at home. He denies any cardiovascular symptoms, denies any history of heart disease.  He was advised to follow-up with his PCP after appointment today to report these numbers and to get advice on any recommendations for medication changes.  Patient understands that markedly uncontrolled blood pressure on day of surgery could be cause for cancellation.  History of R ICA aneurysm s/p pipeline embolization by Dr. Kathyrn Sheriff 2016.  Patient did contact his PCP on 12/05/2020, per telephone notes his telmisartan was increased to 80 mg/day and he was instructed to monitor blood pressure leading up to surgery.  Preop labs reviewed, unremarkable.  EKG 10/19/2020 (copy on chart): Sinus tachycardia.  Rate 120.  Old anterior infarct.  When compared to previous EKGs available in epic, rate has increased, otherwise appears similar.  CHEST - 2 VIEW 08/08/20: COMPARISON:  October 02, 2014.  FINDINGS: Lungs are clear. Heart size and pulmonary vascularity are normal. No adenopathy. There is degenerative change in the thoracic spine.  IMPRESSION: Lungs clear.  Cardiac silhouette normal.PAT note by Karoline Caldwell, PA-C )      Anesthesia Quick Evaluation

## 2020-12-06 NOTE — Progress Notes (Signed)
Anesthesia Chart Review:  Patient has a history of hypertension and resting tachycardia that are followed by PCP Philip Lawrence, NP.  He was previously cleared for surgery per office visit note 10/19/2020, however he has continued to struggle with uncontrolled hypertension.  He reports he is currently taking Toprol-XL 50 mg/day and telmisartan 40 mg/day.  His blood pressure upon arrival to preadmission testing appointment was 180/104 and 172/109 on recheck.  Heart rate 111.  He reports that these numbers are similar to the numbers he has been getting when checking at home. He denies any cardiovascular symptoms, denies any history of heart disease.  He was advised to follow-up with his PCP after appointment today to report these numbers and to get advice on any recommendations for medication changes.  Patient understands that markedly uncontrolled blood pressure on day of surgery could be cause for cancellation.  History of R ICA aneurysm s/p pipeline embolization by Dr. Kathyrn Sheriff 2016.  Patient did contact his PCP on 12/05/2020, per telephone notes his telmisartan was increased to 80 mg/day and he was instructed to monitor blood pressure leading up to surgery.  Preop labs reviewed, unremarkable.  EKG 10/19/2020 (copy on chart): Sinus tachycardia.  Rate 120.  Old anterior infarct.  When compared to previous EKGs available in epic, rate has increased, otherwise appears similar.  CHEST - 2 VIEW 08/08/20: COMPARISON:  October 02, 2014.  FINDINGS: Lungs are clear. Heart size and pulmonary vascularity are normal. No adenopathy. There is degenerative change in the thoracic spine.  IMPRESSION: Lungs clear.  Cardiac silhouette normal.   Philip Gates [facility] Short Stay Center/Anesthesiology Phone [phone] 12/06/2020 9:28 AM

## 2020-12-07 ENCOUNTER — Encounter (HOSPITAL_COMMUNITY): Payer: Self-pay | Admitting: Neurosurgery

## 2020-12-07 ENCOUNTER — Encounter (HOSPITAL_COMMUNITY)
Admission: RE | Disposition: A | Discharge status: Home / Self Care | Payer: Self-pay | Source: Home / Self Care | Attending: Neurosurgery

## 2020-12-07 ENCOUNTER — Inpatient Hospital Stay (HOSPITAL_COMMUNITY): Payer: PPO | Admitting: Physician Assistant

## 2020-12-07 ENCOUNTER — Inpatient Hospital Stay (HOSPITAL_COMMUNITY): Payer: PPO

## 2020-12-07 ENCOUNTER — Other Ambulatory Visit: Payer: Self-pay

## 2020-12-07 ENCOUNTER — Inpatient Hospital Stay (HOSPITAL_COMMUNITY)
Admission: RE | Admit: 2020-12-07 | Discharge: 2020-12-08 | DRG: 460 | Disposition: A | Discharge status: Home / Self Care | Payer: PPO | Attending: Neurosurgery | Admitting: Neurosurgery

## 2020-12-07 DIAGNOSIS — Z880 Allergy status to penicillin: Secondary | ICD-10-CM

## 2020-12-07 DIAGNOSIS — Z881 Allergy status to other antibiotic agents status: Secondary | ICD-10-CM | POA: Diagnosis not present

## 2020-12-07 DIAGNOSIS — Z87891 Personal history of nicotine dependence: Secondary | ICD-10-CM | POA: Diagnosis not present

## 2020-12-07 DIAGNOSIS — K219 Gastro-esophageal reflux disease without esophagitis: Secondary | ICD-10-CM | POA: Diagnosis present

## 2020-12-07 DIAGNOSIS — M48061 Spinal stenosis, lumbar region without neurogenic claudication: Secondary | ICD-10-CM | POA: Diagnosis present

## 2020-12-07 DIAGNOSIS — Z96653 Presence of artificial knee joint, bilateral: Secondary | ICD-10-CM | POA: Diagnosis present

## 2020-12-07 DIAGNOSIS — I10 Essential (primary) hypertension: Secondary | ICD-10-CM | POA: Diagnosis present

## 2020-12-07 DIAGNOSIS — Z7982 Long term (current) use of aspirin: Secondary | ICD-10-CM | POA: Diagnosis not present

## 2020-12-07 DIAGNOSIS — Z79899 Other long term (current) drug therapy: Secondary | ICD-10-CM

## 2020-12-07 DIAGNOSIS — E669 Obesity, unspecified: Secondary | ICD-10-CM | POA: Diagnosis present

## 2020-12-07 DIAGNOSIS — J449 Chronic obstructive pulmonary disease, unspecified: Secondary | ICD-10-CM | POA: Diagnosis present

## 2020-12-07 DIAGNOSIS — M5416 Radiculopathy, lumbar region: Secondary | ICD-10-CM | POA: Diagnosis present

## 2020-12-07 DIAGNOSIS — Z6830 Body mass index (BMI) 30.0-30.9, adult: Secondary | ICD-10-CM | POA: Diagnosis not present

## 2020-12-07 DIAGNOSIS — F319 Bipolar disorder, unspecified: Secondary | ICD-10-CM | POA: Diagnosis present

## 2020-12-07 DIAGNOSIS — M1990 Unspecified osteoarthritis, unspecified site: Secondary | ICD-10-CM | POA: Diagnosis present

## 2020-12-07 DIAGNOSIS — F909 Attention-deficit hyperactivity disorder, unspecified type: Secondary | ICD-10-CM | POA: Diagnosis present

## 2020-12-07 DIAGNOSIS — M4326 Fusion of spine, lumbar region: Secondary | ICD-10-CM | POA: Diagnosis not present

## 2020-12-07 DIAGNOSIS — Z419 Encounter for procedure for purposes other than remedying health state, unspecified: Secondary | ICD-10-CM

## 2020-12-07 DIAGNOSIS — M48062 Spinal stenosis, lumbar region with neurogenic claudication: Secondary | ICD-10-CM | POA: Diagnosis present

## 2020-12-07 DIAGNOSIS — Z20822 Contact with and (suspected) exposure to covid-19: Secondary | ICD-10-CM | POA: Diagnosis present

## 2020-12-07 DIAGNOSIS — Z981 Arthrodesis status: Secondary | ICD-10-CM | POA: Diagnosis not present

## 2020-12-07 HISTORY — PX: BACK SURGERY: SHX140

## 2020-12-07 IMAGING — RF DG C-ARM 1-60 MIN
1 series · 3 of 3 positions shown · non-contrast
Comparison: MRI [DATE].

CLINICAL DATA: L3-L4 PLIF.

EXAM:
DG C-ARM 1-60 MIN; LUMBAR SPINE - 2-3 VIEW
FLUOROSCOPY TIME:  Fluoroscopy Time:  39 seconds
Radiation Exposure Index (if provided by the fluoroscopic device):
29.19 mGy.
Number of Acquired Spot Images: 3

[Series 1: run · 3 of 3 slices shown]
[im 1/3]
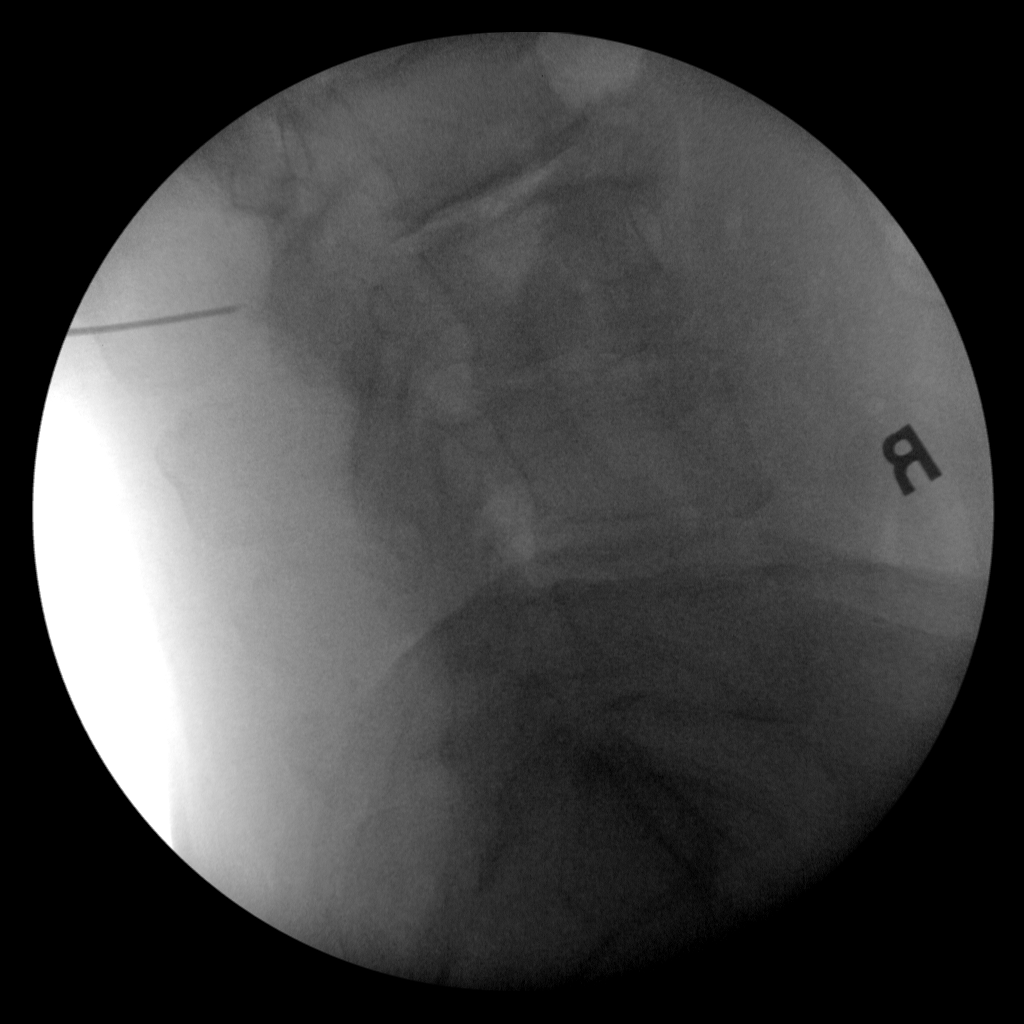
[im 2/3]
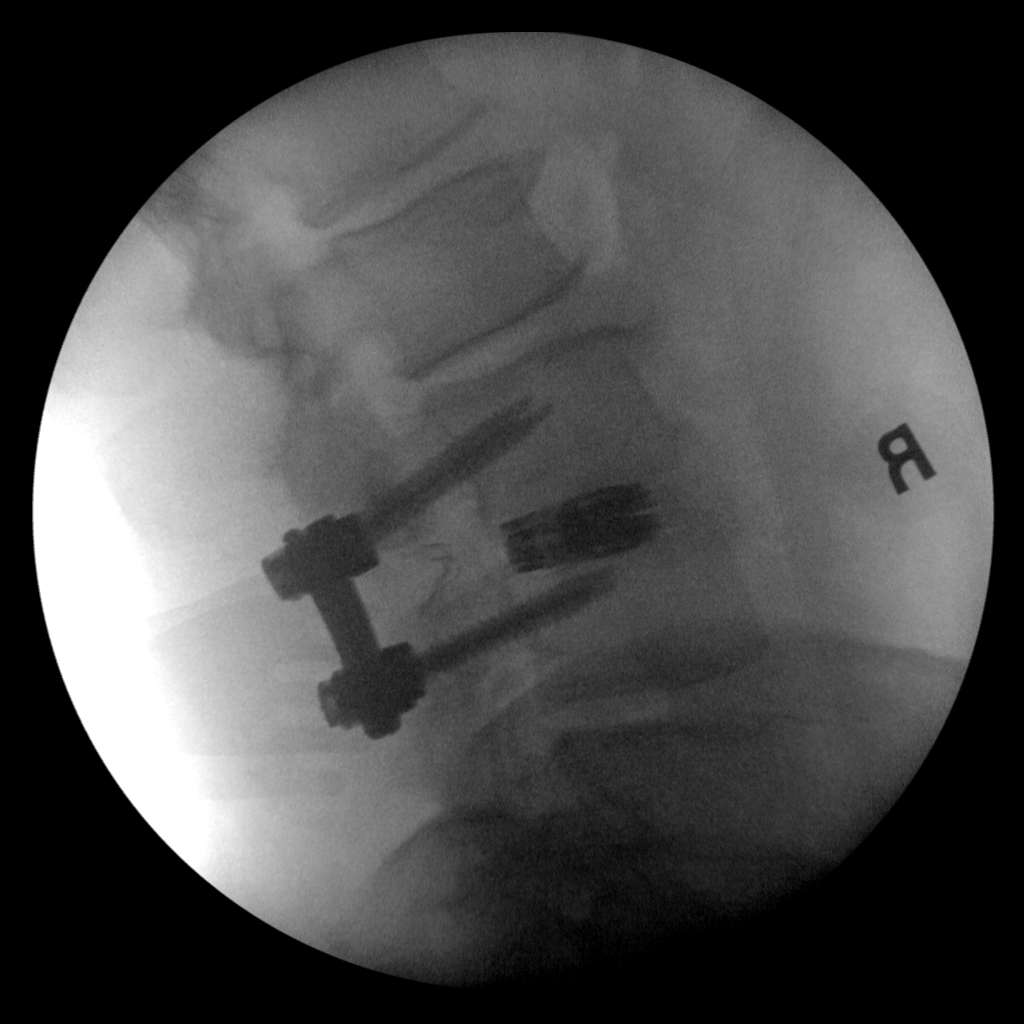
[im 3/3]
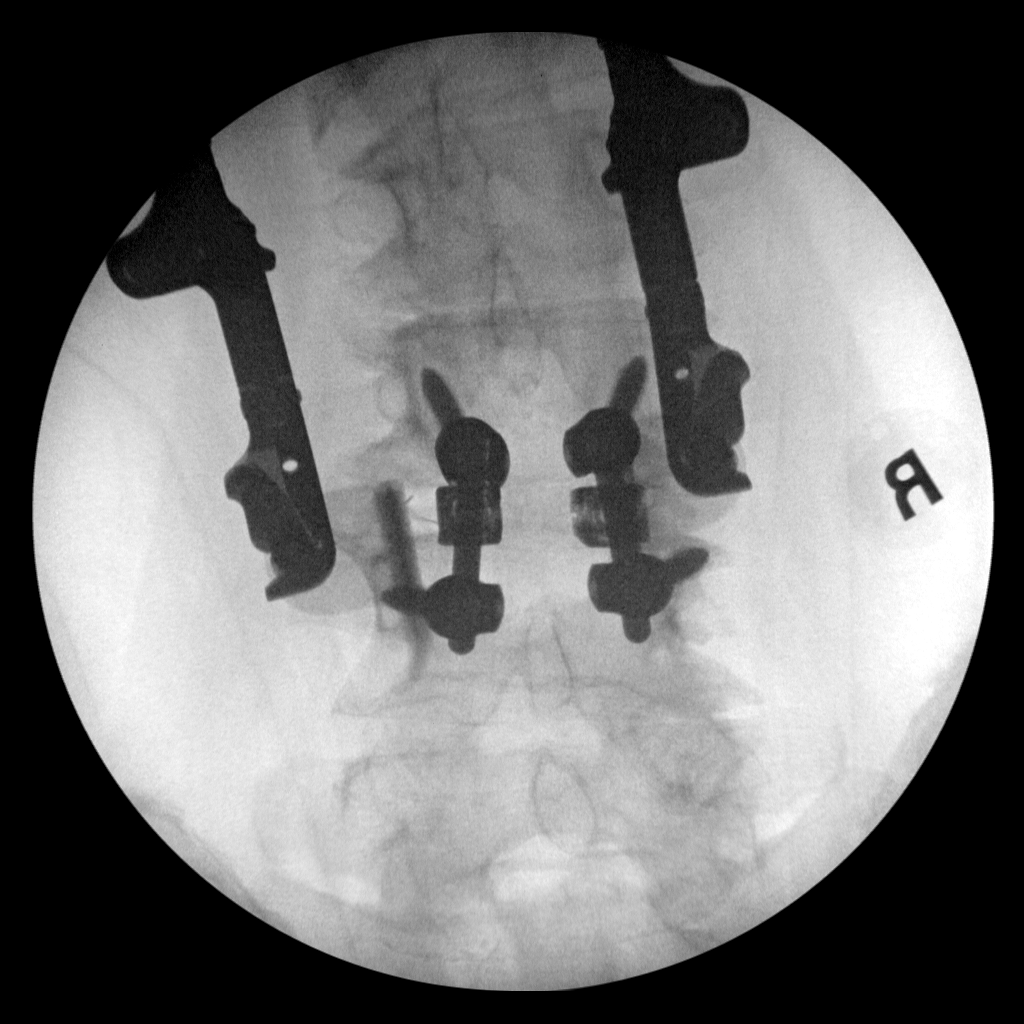

[3 of 3 positions shown; findings below may reference images not displayed]

FINDINGS: Three C-arm fluoroscopic images were obtained intraoperatively and
submitted for post operative interpretation. These images
demonstrate bilateral pedicle screws at L3 and L4 with intervening
rods and intervening L3-L4 spacer. Please see the performing
provider's procedural report for further detail.
IMPRESSION: L3-L4 PLIF.

## 2020-12-07 IMAGING — RF DG LUMBAR SPINE 2-3V
1 series · 3 of 3 positions shown · non-contrast
Comparison: MRI [DATE].

CLINICAL DATA: L3-L4 PLIF.

EXAM:
DG C-ARM 1-60 MIN; LUMBAR SPINE - 2-3 VIEW
FLUOROSCOPY TIME:  Fluoroscopy Time:  39 seconds
Radiation Exposure Index (if provided by the fluoroscopic device):
29.19 mGy.
Number of Acquired Spot Images: 3

[Series 1: run · 3 of 3 slices shown]
[im 1/3]
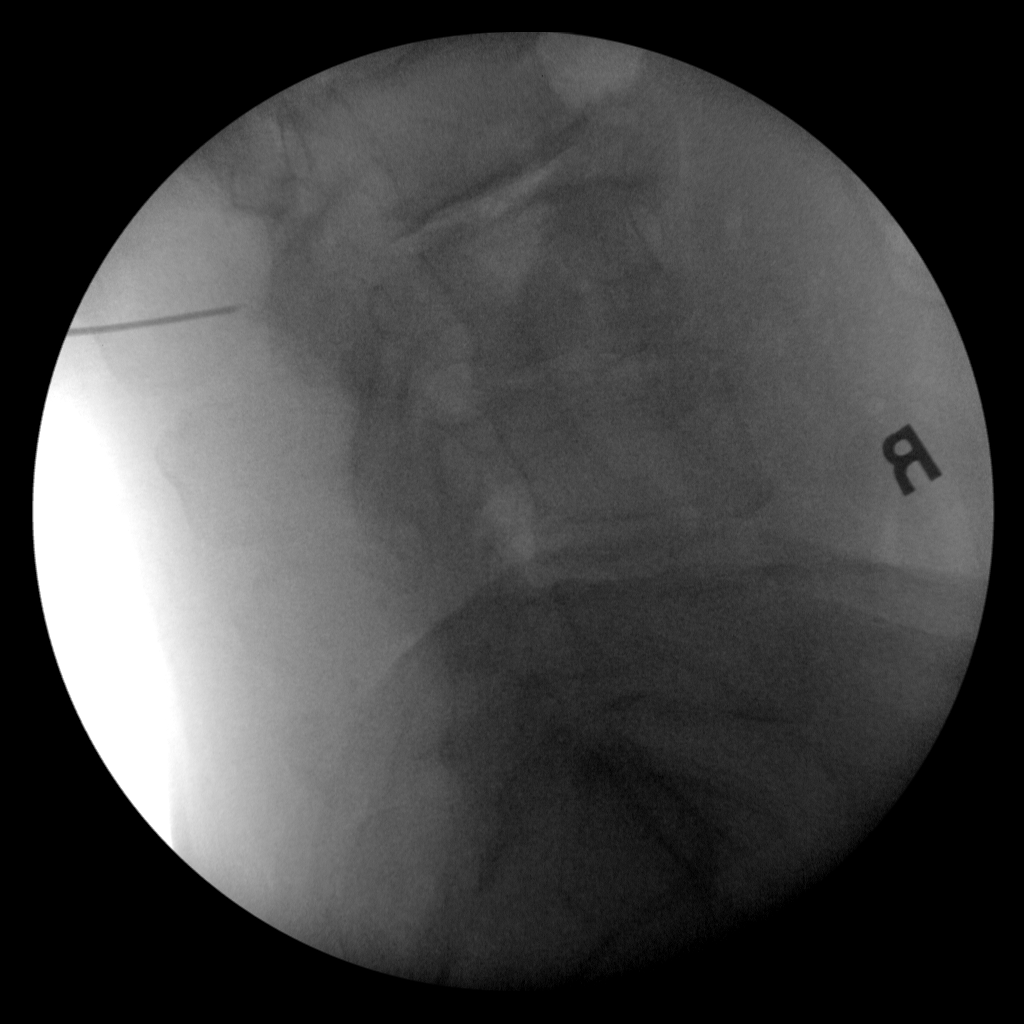
[im 2/3]
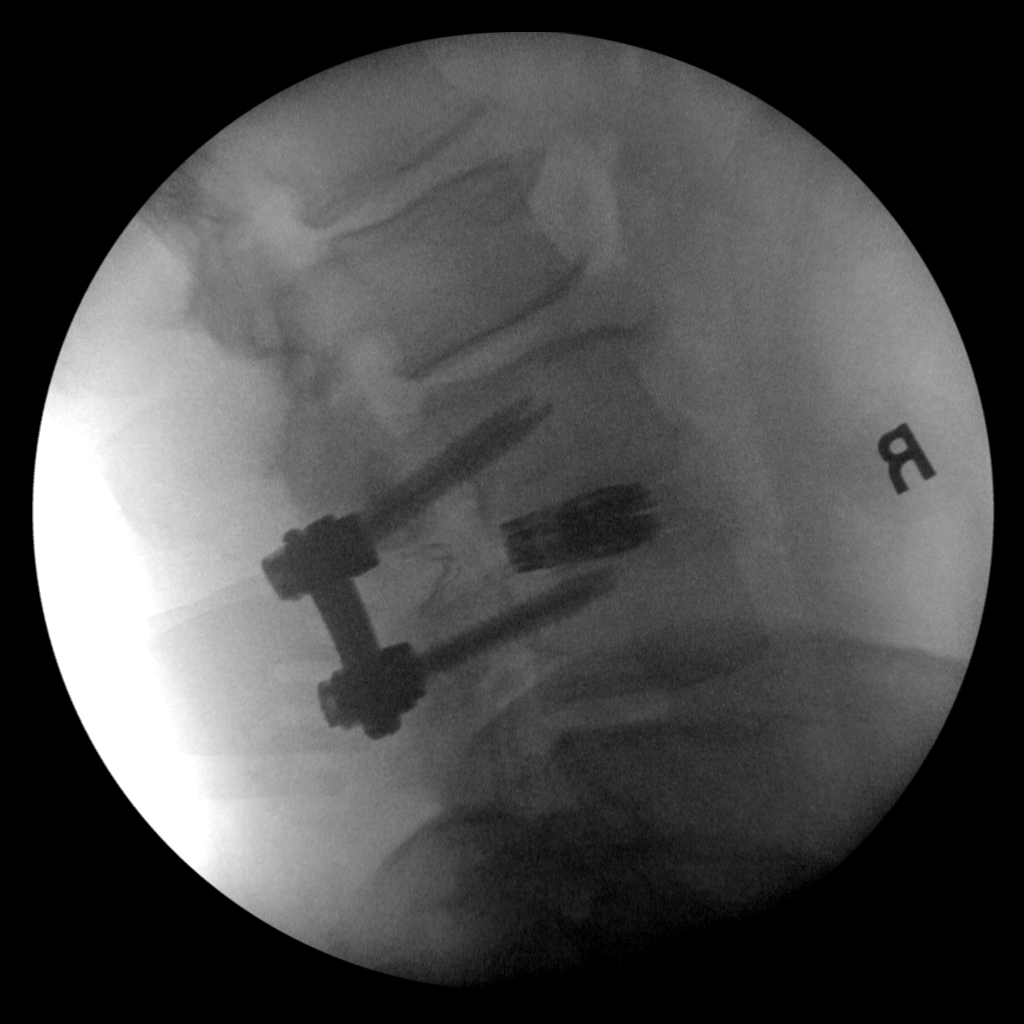
[im 3/3]
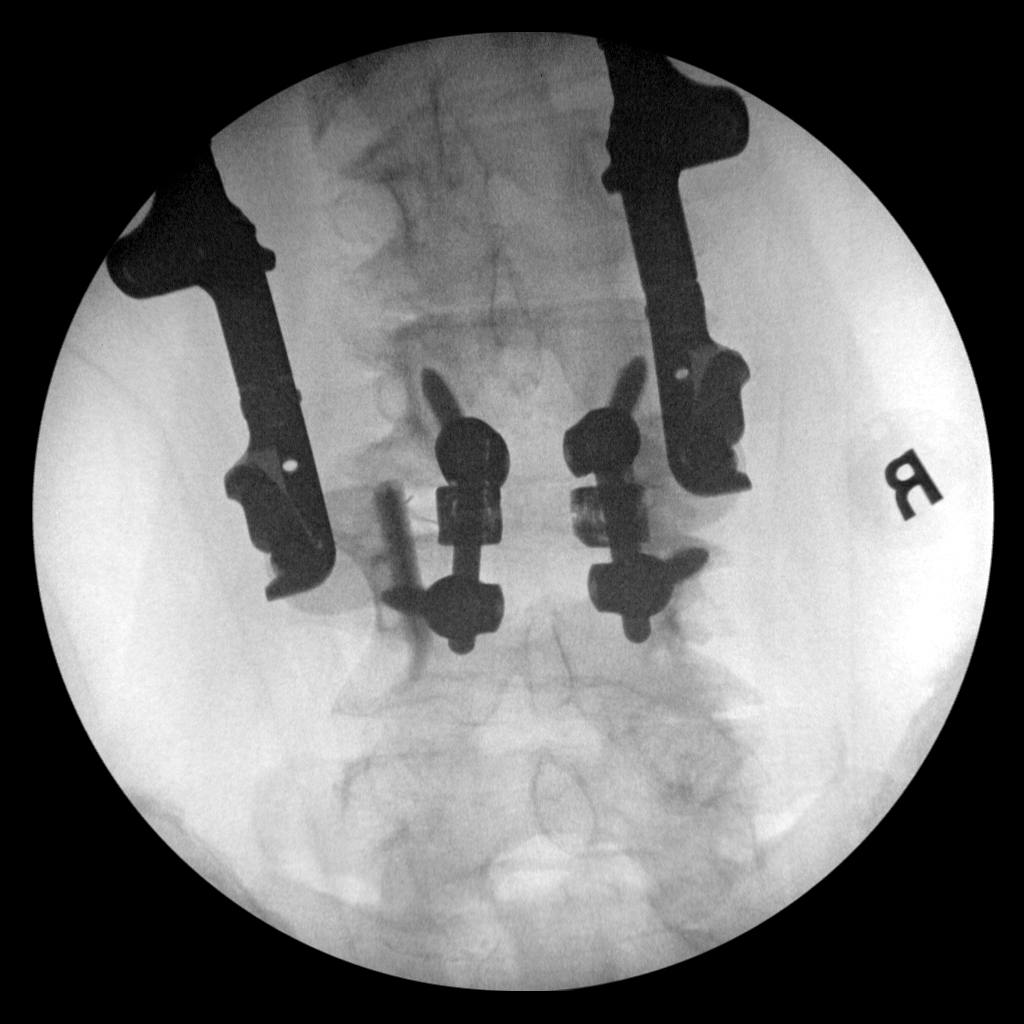

[3 of 3 positions shown; findings below may reference images not displayed]

FINDINGS: Three C-arm fluoroscopic images were obtained intraoperatively and
submitted for post operative interpretation. These images
demonstrate bilateral pedicle screws at L3 and L4 with intervening
rods and intervening L3-L4 spacer. Please see the performing
provider's procedural report for further detail.
IMPRESSION: L3-L4 PLIF.

## 2020-12-07 SURGERY — POSTERIOR LUMBAR FUSION 1 LEVEL
Anesthesia: General

## 2020-12-07 MED ORDER — SENNA 8.6 MG PO TABS
1.0000 | ORAL_TABLET | Freq: Two times a day (BID) | ORAL | Status: DC
  Administered 2020-12-07 – 2020-12-08 (×2): 8.6 mg via ORAL
  Filled 2020-12-07 (×2): qty 1

## 2020-12-07 MED ORDER — FENTANYL CITRATE (PF) 100 MCG/2ML IJ SOLN
25.0000 ug | INTRAMUSCULAR | Status: DC | PRN
  Administered 2020-12-07 (×2): 50 ug via INTRAVENOUS

## 2020-12-07 MED ORDER — PHENYLEPHRINE 40 MCG/ML (10ML) SYRINGE FOR IV PUSH (FOR BLOOD PRESSURE SUPPORT)
PREFILLED_SYRINGE | INTRAVENOUS | Status: DC | PRN
  Administered 2020-12-07: 160 ug via INTRAVENOUS
  Administered 2020-12-07 (×2): 120 ug via INTRAVENOUS

## 2020-12-07 MED ORDER — LISDEXAMFETAMINE DIMESYLATE 70 MG PO CAPS
70.0000 mg | ORAL_CAPSULE | Freq: Every day | ORAL | Status: DC
Start: 2020-12-07 — End: 2020-12-08
  Administered 2020-12-08: 70 mg via ORAL
  Filled 2020-12-07: qty 1

## 2020-12-07 MED ORDER — DEXAMETHASONE SODIUM PHOSPHATE 10 MG/ML IJ SOLN
INTRAMUSCULAR | Status: DC | PRN
  Administered 2020-12-07: 10 mg via INTRAVENOUS

## 2020-12-07 MED ORDER — PROPOFOL 10 MG/ML IV BOLUS
INTRAVENOUS | Status: AC
  Filled 2020-12-07: qty 40

## 2020-12-07 MED ORDER — 0.9 % SODIUM CHLORIDE (POUR BTL) OPTIME
TOPICAL | Status: DC | PRN
  Administered 2020-12-07: 1000 mL

## 2020-12-07 MED ORDER — LIDOCAINE-EPINEPHRINE 1 %-1:100000 IJ SOLN
INTRAMUSCULAR | Status: DC | PRN
  Administered 2020-12-07: 5 mL

## 2020-12-07 MED ORDER — VANCOMYCIN HCL 1000 MG/200ML IV SOLN
1000.0000 mg | Freq: Once | INTRAVENOUS | Status: AC
  Administered 2020-12-07: 1000 mg via INTRAVENOUS
  Filled 2020-12-07: qty 200

## 2020-12-07 MED ORDER — DEXAMETHASONE SODIUM PHOSPHATE 10 MG/ML IJ SOLN
INTRAMUSCULAR | Status: AC
  Filled 2020-12-07: qty 1

## 2020-12-07 MED ORDER — FENTANYL CITRATE (PF) 100 MCG/2ML IJ SOLN
INTRAMUSCULAR | Status: AC
  Filled 2020-12-07: qty 2

## 2020-12-07 MED ORDER — OXYCODONE HCL 5 MG PO TABS
5.0000 mg | ORAL_TABLET | ORAL | Status: DC | PRN
  Administered 2020-12-07 – 2020-12-08 (×5): 10 mg via ORAL
  Filled 2020-12-07 (×4): qty 2

## 2020-12-07 MED ORDER — ROCURONIUM BROMIDE 10 MG/ML (PF) SYRINGE
PREFILLED_SYRINGE | INTRAVENOUS | Status: DC | PRN
  Administered 2020-12-07: 60 mg via INTRAVENOUS
  Administered 2020-12-07: 40 mg via INTRAVENOUS

## 2020-12-07 MED ORDER — CHLORHEXIDINE GLUCONATE 0.12 % MT SOLN: 15.0000 mL | Freq: Once | OROMUCOSAL | Status: AC

## 2020-12-07 MED ORDER — CALCIUM CHLORIDE 10 % IV SOLN
INTRAVENOUS | Status: DC | PRN
  Administered 2020-12-07: 1 g via INTRAVENOUS

## 2020-12-07 MED ORDER — CYCLOBENZAPRINE HCL 5 MG PO TABS
5.0000 mg | ORAL_TABLET | Freq: Three times a day (TID) | ORAL | Status: DC | PRN
  Administered 2020-12-07 – 2020-12-08 (×3): 5 mg via ORAL
  Filled 2020-12-07 (×4): qty 1

## 2020-12-07 MED ORDER — ONDANSETRON HCL 4 MG PO TABS: 4.0000 mg | ORAL_TABLET | Freq: Four times a day (QID) | ORAL | Status: DC | PRN

## 2020-12-07 MED ORDER — CALCIUM CHLORIDE 10 % IV SOLN
INTRAVENOUS | Status: AC
  Filled 2020-12-07: qty 10

## 2020-12-07 MED ORDER — LIDOCAINE 2% (20 MG/ML) 5 ML SYRINGE
INTRAMUSCULAR | Status: AC
  Filled 2020-12-07: qty 5

## 2020-12-07 MED ORDER — SODIUM CHLORIDE 0.9 % IV SOLN: 250.0000 mL | INTRAVENOUS | Status: DC

## 2020-12-07 MED ORDER — SENNOSIDES-DOCUSATE SODIUM 8.6-50 MG PO TABS
1.0000 | ORAL_TABLET | Freq: Every evening | ORAL | Status: DC | PRN
Start: 2020-12-07 — End: 2020-12-08

## 2020-12-07 MED ORDER — LIDOCAINE 2% (20 MG/ML) 5 ML SYRINGE
INTRAMUSCULAR | Status: DC | PRN
  Administered 2020-12-07: 100 mg via INTRAVENOUS

## 2020-12-07 MED ORDER — IRBESARTAN 150 MG PO TABS
150.0000 mg | ORAL_TABLET | Freq: Every day | ORAL | Status: DC
  Administered 2020-12-08: 150 mg via ORAL
  Filled 2020-12-07: qty 1

## 2020-12-07 MED ORDER — LAMOTRIGINE 100 MG PO TABS
200.0000 mg | ORAL_TABLET | Freq: Every day | ORAL | Status: DC
  Administered 2020-12-07: 200 mg via ORAL
  Filled 2020-12-07: qty 2

## 2020-12-07 MED ORDER — DUTASTERIDE-TAMSULOSIN HCL 0.5-0.4 MG PO CAPS: 1.0000 | ORAL_CAPSULE | Freq: Every morning | ORAL | Status: DC

## 2020-12-07 MED ORDER — ONDANSETRON HCL 4 MG/2ML IJ SOLN
INTRAMUSCULAR | Status: AC
  Filled 2020-12-07: qty 2

## 2020-12-07 MED ORDER — DOCUSATE SODIUM 100 MG PO CAPS
100.0000 mg | ORAL_CAPSULE | Freq: Two times a day (BID) | ORAL | Status: DC
  Administered 2020-12-07 – 2020-12-08 (×2): 100 mg via ORAL
  Filled 2020-12-07 (×2): qty 1

## 2020-12-07 MED ORDER — FENTANYL CITRATE (PF) 250 MCG/5ML IJ SOLN
INTRAMUSCULAR | Status: AC
  Filled 2020-12-07: qty 5

## 2020-12-07 MED ORDER — HYDROMORPHONE HCL 1 MG/ML IJ SOLN
INTRAMUSCULAR | Status: AC
  Filled 2020-12-07: qty 1

## 2020-12-07 MED ORDER — CHLORHEXIDINE GLUCONATE CLOTH 2 % EX PADS: 6.0000 | MEDICATED_PAD | Freq: Once | CUTANEOUS | Status: DC

## 2020-12-07 MED ORDER — ACETAMINOPHEN 500 MG PO TABS
1000.0000 mg | ORAL_TABLET | Freq: Four times a day (QID) | ORAL | Status: AC
  Administered 2020-12-07 – 2020-12-08 (×4): 1000 mg via ORAL
  Filled 2020-12-07 (×4): qty 2

## 2020-12-07 MED ORDER — VASOPRESSIN 20 UNIT/ML IV SOLN
INTRAVENOUS | Status: DC | PRN
  Administered 2020-12-07 (×3): 1 [IU] via INTRAVENOUS

## 2020-12-07 MED ORDER — METHOCARBAMOL 500 MG PO TABS
ORAL_TABLET | ORAL | Status: AC
  Filled 2020-12-07: qty 1

## 2020-12-07 MED ORDER — OXYCODONE HCL 5 MG PO TABS
ORAL_TABLET | ORAL | Status: AC
  Filled 2020-12-07: qty 2

## 2020-12-07 MED ORDER — THROMBIN 5000 UNITS EX SOLR
CUTANEOUS | Status: AC
  Filled 2020-12-07: qty 5000

## 2020-12-07 MED ORDER — FENTANYL CITRATE (PF) 250 MCG/5ML IJ SOLN
INTRAMUSCULAR | Status: DC | PRN
  Administered 2020-12-07: 50 ug via INTRAVENOUS
  Administered 2020-12-07: 100 ug via INTRAVENOUS
  Administered 2020-12-07 (×2): 50 ug via INTRAVENOUS

## 2020-12-07 MED ORDER — ROCURONIUM BROMIDE 10 MG/ML (PF) SYRINGE
PREFILLED_SYRINGE | INTRAVENOUS | Status: AC
  Filled 2020-12-07: qty 20

## 2020-12-07 MED ORDER — METHOCARBAMOL 500 MG PO TABS
500.0000 mg | ORAL_TABLET | Freq: Four times a day (QID) | ORAL | Status: DC | PRN
  Administered 2020-12-07 – 2020-12-08 (×2): 500 mg via ORAL
  Filled 2020-12-07 (×2): qty 1

## 2020-12-07 MED ORDER — MENTHOL 3 MG MT LOZG: 1.0000 | LOZENGE | OROMUCOSAL | Status: DC | PRN

## 2020-12-07 MED ORDER — CHLORHEXIDINE GLUCONATE 0.12 % MT SOLN
OROMUCOSAL | Status: AC
  Administered 2020-12-07: 15 mL via OROMUCOSAL
  Filled 2020-12-07: qty 15

## 2020-12-07 MED ORDER — MIDAZOLAM HCL 2 MG/2ML IJ SOLN
INTRAMUSCULAR | Status: DC | PRN
  Administered 2020-12-07: 2 mg via INTRAVENOUS

## 2020-12-07 MED ORDER — HYDROMORPHONE HCL 1 MG/ML IJ SOLN
0.2500 mg | INTRAMUSCULAR | Status: DC | PRN
  Administered 2020-12-07 (×4): 0.5 mg via INTRAVENOUS

## 2020-12-07 MED ORDER — METHOCARBAMOL 1000 MG/10ML IJ SOLN
500.0000 mg | Freq: Four times a day (QID) | INTRAVENOUS | Status: DC | PRN
  Filled 2020-12-07: qty 5

## 2020-12-07 MED ORDER — ONDANSETRON HCL 4 MG/2ML IJ SOLN: 4.0000 mg | Freq: Four times a day (QID) | INTRAMUSCULAR | Status: DC | PRN

## 2020-12-07 MED ORDER — THROMBIN 5000 UNITS EX SOLR
OROMUCOSAL | Status: DC | PRN
  Administered 2020-12-07: 5 mL via TOPICAL

## 2020-12-07 MED ORDER — SUGAMMADEX SODIUM 200 MG/2ML IV SOLN
INTRAVENOUS | Status: DC | PRN
  Administered 2020-12-07: 200 mg via INTRAVENOUS

## 2020-12-07 MED ORDER — LACTATED RINGERS IV SOLN: INTRAVENOUS | Status: DC

## 2020-12-07 MED ORDER — SODIUM CHLORIDE 0.9% FLUSH
3.0000 mL | Freq: Two times a day (BID) | INTRAVENOUS | Status: DC
  Administered 2020-12-07 – 2020-12-08 (×2): 3 mL via INTRAVENOUS

## 2020-12-07 MED ORDER — GABAPENTIN 300 MG PO CAPS
300.0000 mg | ORAL_CAPSULE | Freq: Three times a day (TID) | ORAL | Status: DC
  Administered 2020-12-07 – 2020-12-08 (×3): 300 mg via ORAL
  Filled 2020-12-07 (×3): qty 1

## 2020-12-07 MED ORDER — PROPOFOL 10 MG/ML IV BOLUS
INTRAVENOUS | Status: DC | PRN
  Administered 2020-12-07: 140 mg via INTRAVENOUS

## 2020-12-07 MED ORDER — ACETAMINOPHEN 325 MG PO TABS: 650.0000 mg | ORAL_TABLET | ORAL | Status: DC | PRN

## 2020-12-07 MED ORDER — PANTOPRAZOLE SODIUM 40 MG PO TBEC
40.0000 mg | DELAYED_RELEASE_TABLET | Freq: Every day | ORAL | Status: DC
  Administered 2020-12-08: 40 mg via ORAL
  Filled 2020-12-07: qty 1

## 2020-12-07 MED ORDER — FLEET ENEMA 7-19 GM/118ML RE ENEM: 1.0000 | ENEMA | Freq: Once | RECTAL | Status: DC | PRN

## 2020-12-07 MED ORDER — PHENOL 1.4 % MT LIQD: 1.0000 | OROMUCOSAL | Status: DC | PRN

## 2020-12-07 MED ORDER — BUPIVACAINE HCL (PF) 0.5 % IJ SOLN
INTRAMUSCULAR | Status: DC | PRN
  Administered 2020-12-07: 5 mL

## 2020-12-07 MED ORDER — HYDROCODONE-ACETAMINOPHEN 5-325 MG PO TABS
1.0000 | ORAL_TABLET | ORAL | Status: DC | PRN
  Administered 2020-12-07: 1 via ORAL
  Filled 2020-12-07: qty 1

## 2020-12-07 MED ORDER — SODIUM CHLORIDE 0.9% FLUSH: 3.0000 mL | INTRAVENOUS | Status: DC | PRN

## 2020-12-07 MED ORDER — ALBUMIN HUMAN 5 % IV SOLN: INTRAVENOUS | Status: DC | PRN

## 2020-12-07 MED ORDER — HYDROMORPHONE HCL 1 MG/ML IJ SOLN
0.5000 mg | INTRAMUSCULAR | Status: DC | PRN
  Administered 2020-12-07 – 2020-12-08 (×5): 1 mg via INTRAVENOUS
  Filled 2020-12-07 (×6): qty 1

## 2020-12-07 MED ORDER — VASOPRESSIN 20 UNIT/ML IV SOLN
INTRAVENOUS | Status: AC
  Filled 2020-12-07: qty 1

## 2020-12-07 MED ORDER — ONDANSETRON HCL 4 MG/2ML IJ SOLN
INTRAMUSCULAR | Status: DC | PRN
  Administered 2020-12-07: 4 mg via INTRAVENOUS

## 2020-12-07 MED ORDER — PHENYLEPHRINE 40 MCG/ML (10ML) SYRINGE FOR IV PUSH (FOR BLOOD PRESSURE SUPPORT)
PREFILLED_SYRINGE | INTRAVENOUS | Status: AC
  Filled 2020-12-07: qty 10

## 2020-12-07 MED ORDER — BUPIVACAINE HCL (PF) 0.5 % IJ SOLN
INTRAMUSCULAR | Status: AC
  Filled 2020-12-07: qty 30

## 2020-12-07 MED ORDER — PHENYLEPHRINE HCL-NACL 10-0.9 MG/250ML-% IV SOLN
INTRAVENOUS | Status: DC | PRN
  Administered 2020-12-07: 25 ug/min via INTRAVENOUS

## 2020-12-07 MED ORDER — METOPROLOL SUCCINATE ER 50 MG PO TB24: 50.0000 mg | ORAL_TABLET | Freq: Every day | ORAL | Status: DC

## 2020-12-07 MED ORDER — ACETAMINOPHEN 500 MG PO TABS
1000.0000 mg | ORAL_TABLET | Freq: Once | ORAL | Status: AC
  Administered 2020-12-07: 1000 mg via ORAL
  Filled 2020-12-07: qty 2

## 2020-12-07 MED ORDER — BISACODYL 10 MG RE SUPP: 10.0000 mg | Freq: Every day | RECTAL | Status: DC | PRN

## 2020-12-07 MED ORDER — SODIUM CHLORIDE 0.9 % IV SOLN: INTRAVENOUS | Status: DC

## 2020-12-07 MED ORDER — LIDOCAINE-EPINEPHRINE 1 %-1:100000 IJ SOLN
INTRAMUSCULAR | Status: AC
  Filled 2020-12-07: qty 1

## 2020-12-07 MED ORDER — VANCOMYCIN HCL 1500 MG/300ML IV SOLN
1500.0000 mg | INTRAVENOUS | Status: AC
  Administered 2020-12-07 (×2): 1500 mg via INTRAVENOUS
  Filled 2020-12-07: qty 300

## 2020-12-07 MED ORDER — ORAL CARE MOUTH RINSE: 15.0000 mL | Freq: Once | OROMUCOSAL | Status: AC

## 2020-12-07 MED ORDER — MIDAZOLAM HCL 2 MG/2ML IJ SOLN
INTRAMUSCULAR | Status: AC
  Filled 2020-12-07: qty 2

## 2020-12-07 MED ORDER — ACETAMINOPHEN 650 MG RE SUPP: 650.0000 mg | RECTAL | Status: DC | PRN

## 2020-12-07 MED ORDER — DUTASTERIDE 0.5 MG PO CAPS
0.5000 mg | ORAL_CAPSULE | Freq: Every day | ORAL | Status: DC
  Administered 2020-12-08: 0.5 mg via ORAL
  Filled 2020-12-07: qty 1

## 2020-12-07 MED ORDER — PROMETHAZINE HCL 25 MG/ML IJ SOLN: 6.2500 mg | INTRAMUSCULAR | Status: DC | PRN

## 2020-12-07 MED ORDER — TAMSULOSIN HCL 0.4 MG PO CAPS
0.4000 mg | ORAL_CAPSULE | Freq: Every day | ORAL | Status: DC
  Administered 2020-12-08: 0.4 mg via ORAL
  Filled 2020-12-07: qty 1

## 2020-12-07 SURGICAL SUPPLY — 72 items
ADH SKN CLS APL DERMABOND .7 (GAUZE/BANDAGES/DRESSINGS) ×1
APL SKNCLS STERI-STRIP NONHPOA (GAUZE/BANDAGES/DRESSINGS)
BASKET BONE COLLECTION (BASKET) ×3 IMPLANT
BENZOIN TINCTURE PRP APPL 2/3 (GAUZE/BANDAGES/DRESSINGS) IMPLANT
BLADE CLIPPER SURG (BLADE) IMPLANT
BLADE SURG 11 STRL SS (BLADE) ×3 IMPLANT
BUR MATCHSTICK NEURO 3.0 LAGG (BURR) ×3 IMPLANT
BUR PRECISION FLUTE 5.0 (BURR) ×3 IMPLANT
CAGE INTERBODY PL LG 7X26.5X24 (Cage) ×6 IMPLANT
CANISTER SUCT 3000ML PPV (MISCELLANEOUS) ×3 IMPLANT
CARTRIDGE OIL MAESTRO DRILL (MISCELLANEOUS) ×1 IMPLANT
CLOSURE WOUND 1/2 X4 (GAUZE/BANDAGES/DRESSINGS)
CNTNR URN SCR LID CUP LEK RST (MISCELLANEOUS) ×1 IMPLANT
CONT SPEC 4OZ STRL OR WHT (MISCELLANEOUS) ×6
COVER BACK TABLE 60X90IN (DRAPES) ×3 IMPLANT
COVER WAND RF STERILE (DRAPES) ×3 IMPLANT
DECANTER SPIKE VIAL GLASS SM (MISCELLANEOUS) ×3 IMPLANT
DERMABOND ADVANCED (GAUZE/BANDAGES/DRESSINGS) ×2
DERMABOND ADVANCED .7 DNX12 (GAUZE/BANDAGES/DRESSINGS) ×1 IMPLANT
DIFFUSER DRILL AIR PNEUMATIC (MISCELLANEOUS) ×3 IMPLANT
DRAPE C-ARM 42X72 X-RAY (DRAPES) ×3 IMPLANT
DRAPE C-ARMOR (DRAPES) ×3 IMPLANT
DRAPE LAPAROTOMY 100X72X124 (DRAPES) ×3 IMPLANT
DRAPE SURG 17X23 STRL (DRAPES) ×3 IMPLANT
DRSG OPSITE POSTOP 4X6 (GAUZE/BANDAGES/DRESSINGS) ×3 IMPLANT
DURAPREP 26ML APPLICATOR (WOUND CARE) ×3 IMPLANT
ELECT REM PT RETURN 9FT ADLT (ELECTROSURGICAL) ×3
ELECTRODE REM PT RTRN 9FT ADLT (ELECTROSURGICAL) ×1 IMPLANT
GAUZE 4X4 16PLY RFD (DISPOSABLE) IMPLANT
GAUZE SPONGE 4X4 12PLY STRL (GAUZE/BANDAGES/DRESSINGS) IMPLANT
GLOVE BIO SURGEON STRL SZ7.5 (GLOVE) IMPLANT
GLOVE BIOGEL PI IND STRL 7.5 (GLOVE) ×2 IMPLANT
GLOVE BIOGEL PI INDICATOR 7.5 (GLOVE) ×4
GLOVE ECLIPSE 7.0 STRL STRAW (GLOVE) ×6 IMPLANT
GLOVE EXAM NITRILE XL STR (GLOVE) IMPLANT
GOWN STRL REUS W/ TWL LRG LVL3 (GOWN DISPOSABLE) ×4 IMPLANT
GOWN STRL REUS W/ TWL XL LVL3 (GOWN DISPOSABLE) IMPLANT
GOWN STRL REUS W/TWL 2XL LVL3 (GOWN DISPOSABLE) IMPLANT
GOWN STRL REUS W/TWL LRG LVL3 (GOWN DISPOSABLE) ×12
GOWN STRL REUS W/TWL XL LVL3 (GOWN DISPOSABLE)
GRAFT BONE PROTEIOS XS 0.5CC (Orthopedic Implant) ×2 IMPLANT
HEMOSTAT POWDER KIT SURGIFOAM (HEMOSTASIS) ×3 IMPLANT
KIT BASIN OR (CUSTOM PROCEDURE TRAY) ×3 IMPLANT
KIT POSITION SURG JACKSON T1 (MISCELLANEOUS) ×3 IMPLANT
KIT TURNOVER KIT B (KITS) ×3 IMPLANT
MILL MEDIUM DISP (BLADE) ×3 IMPLANT
NDL HYPO 18GX1.5 BLUNT FILL (NEEDLE) IMPLANT
NEEDLE HYPO 18GX1.5 BLUNT FILL (NEEDLE) IMPLANT
NEEDLE HYPO 22GX1.5 SAFETY (NEEDLE) ×3 IMPLANT
NEEDLE SPNL 18GX3.5 QUINCKE PK (NEEDLE) IMPLANT
NS IRRIG 1000ML POUR BTL (IV SOLUTION) ×3 IMPLANT
OIL CARTRIDGE MAESTRO DRILL (MISCELLANEOUS) ×3
PACK LAMINECTOMY NEURO (CUSTOM PROCEDURE TRAY) ×3 IMPLANT
PAD ARMBOARD 7.5X6 YLW CONV (MISCELLANEOUS) ×9 IMPLANT
PUTTY DBF GRAFTON 3CC W/DELIVE (Putty) ×2 IMPLANT
ROD COBALT 47.5X35 (Rod) ×6 IMPLANT
SCREW 5.5X35MM (Screw) ×3 IMPLANT
SCREW ATS 5.5X40MM (Screw) ×12 IMPLANT
SCREW BN 35X5.5XMA NS SPNE (Screw) IMPLANT
SCREW SET SOLERA (Screw) ×12 IMPLANT
SCREW SET SOLERA TI (Screw) IMPLANT
SPONGE LAP 4X18 RFD (DISPOSABLE) IMPLANT
SPONGE SURGIFOAM ABS GEL 100 (HEMOSTASIS) IMPLANT
STRIP CLOSURE SKIN 1/2X4 (GAUZE/BANDAGES/DRESSINGS) IMPLANT
SUT VIC AB 0 CT1 18XCR BRD8 (SUTURE) ×2 IMPLANT
SUT VIC AB 0 CT1 8-18 (SUTURE) ×6
SUT VICRYL 3-0 RB1 18 ABS (SUTURE) ×3 IMPLANT
SYR 3ML LL SCALE MARK (SYRINGE) ×9 IMPLANT
TOWEL GREEN STERILE (TOWEL DISPOSABLE) ×3 IMPLANT
TOWEL GREEN STERILE FF (TOWEL DISPOSABLE) ×3 IMPLANT
TRAY FOLEY MTR SLVR 16FR STAT (SET/KITS/TRAYS/PACK) ×3 IMPLANT
WATER STERILE IRR 1000ML POUR (IV SOLUTION) ×6 IMPLANT

## 2020-12-07 NOTE — Progress Notes (Signed)
Pt got pre-op vanc at 0800 this AM. He doesn't have a drain in place.   Vanc 1g IV x1 12hr post op  Onnie Boer, PharmD, BCIDP, AAHIVP, CPP Infectious Disease Pharmacist 12/07/2020 1:26 PM

## 2020-12-07 NOTE — Anesthesia Postprocedure Evaluation (Signed)
Anesthesia Post Note  Patient: Philip Gates  Procedure(s) Performed: Posterior Lumbar Interbody Fusion Lumbar Three-Four (N/A )     Patient location during evaluation: PACU Anesthesia Type: General Level of consciousness: awake and alert Pain management: pain level controlled Vital Signs Assessment: post-procedure vital signs reviewed and stable Respiratory status: spontaneous breathing, nonlabored ventilation, respiratory function stable and patient connected to nasal cannula oxygen Cardiovascular status: blood pressure returned to baseline and stable Postop Assessment: no apparent nausea or vomiting Anesthetic complications: no   No complications documented.  Last Vitals:  Vitals:   12/07/20 1215 12/07/20 1248  BP:  (!) 146/102  Pulse: (!) 105 (!) 104  Resp: 18 18  Temp:  36.9 C  SpO2: 93% 96%    Last Pain:  Vitals:   12/07/20 1455  TempSrc:   PainSc: Asleep                 Catalina Gravel

## 2020-12-07 NOTE — Progress Notes (Signed)
Orthopedic Tech Progress Note Patient Details:  Philip Gates 22-Feb-1956 [id number]  Ortho Devices Type of Ortho Device: Lumbar corsett Ortho Device/Splint Location: Back Ortho Device/Splint Interventions: Ordered,Application,Adjustment   Post Interventions Patient Tolerated: Well Instructions Provided: Adjustment of device   [name] 12/07/2020, 2:04 PM

## 2020-12-07 NOTE — Transfer of Care (Signed)
Immediate Anesthesia Transfer of Care Note  Patient: Philip Gates  Procedure(s) Performed: Posterior Lumbar Interbody Fusion Lumbar Three-Four (N/A )  Patient Location: PACU  Anesthesia Type:General  Level of Consciousness: patient cooperative and responds to stimulation  Airway & Oxygen Therapy: Patient Spontanous Breathing and Patient connected to nasal cannula oxygen  Post-op Assessment: Report given to RN, Post -op Vital signs reviewed and stable and Patient moving all extremities X 4  Post vital signs: Reviewed and stable  Last Vitals:  Vitals Value Taken Time  BP 146/96 12/07/20 1051  Temp    Pulse 120 12/07/20 1057  Resp 22 12/07/20 1057  SpO2 92 % 12/07/20 1057  Vitals shown include unvalidated device data.  Last Pain:  Vitals:   12/07/20 0703  TempSrc:   PainSc: 5       Patients Stated Pain Goal: 4 (30/14/99 6924)  Complications: No complications documented.

## 2020-12-07 NOTE — Anesthesia Procedure Notes (Signed)
Procedure Name: Intubation Date/Time: 12/07/2020 7:59 AM Performed by: Betha Loa, CRNA Pre-anesthesia Checklist: Patient identified, Emergency Drugs available, Suction available and Patient being monitored Patient Re-evaluated:Patient Re-evaluated prior to induction Oxygen Delivery Method: Circle System Utilized Preoxygenation: Pre-oxygenation with 100% oxygen Induction Type: IV induction Ventilation: Mask ventilation without difficulty Laryngoscope Size: Mac and 4 Grade View: Grade I Tube type: Oral Tube size: 7.5 mm Number of attempts: 1 Airway Equipment and Method: Stylet and Oral airway Placement Confirmation: ETT inserted through vocal cords under direct vision,  positive ETCO2 and breath sounds checked- equal and bilateral Secured at: 22 cm Tube secured with: Tape Dental Injury: Teeth and Oropharynx as per pre-operative assessment

## 2020-12-08 LAB — PROTIME-INR
INR: 1 (ref 0.8–1.2)
Prothrombin Time: 12.7 seconds (ref 11.4–15.2)

## 2020-12-08 LAB — BASIC METABOLIC PANEL
Anion gap: 10 (ref 5–15)
BUN: 11 mg/dL (ref 8–23)
CO2: 25 mmol/L (ref 22–32)
Calcium: 9.9 mg/dL (ref 8.9–10.3)
Chloride: 99 mmol/L (ref 98–111)
Creatinine, Ser: 0.93 mg/dL (ref 0.61–1.24)
GFR, Estimated: >60 mL/min (ref >60)
Glucose, Bld: 106 mg/dL — ABNORMAL HIGH (ref 70–99)
Potassium: 4.1 mmol/L (ref 3.5–5.1)
Sodium: 134 mmol/L — ABNORMAL LOW (ref 135–145)

## 2020-12-08 LAB — CBC
HCT: 42.8 % (ref 39.0–52.0)
Hemoglobin: 14.7 g/dL (ref 13.0–17.0)
MCH: 33.9 pg (ref 26.0–34.0)
MCHC: 34.3 g/dL (ref 30.0–36.0)
MCV: 98.8 fL (ref 80.0–100.0)
Platelets: 218 10*3/uL (ref 150–400)
RBC: 4.33 MIL/uL (ref 4.22–5.81)
RDW: 13.1 % (ref 11.5–15.5)
WBC: 13.8 10*3/uL — ABNORMAL HIGH (ref 4.0–10.5)
nRBC: 0 % (ref 0.0–0.2)

## 2020-12-08 LAB — APTT: aPTT: 25 seconds (ref 24–36)

## 2020-12-08 MED ORDER — CYCLOBENZAPRINE HCL 5 MG PO TABS: 5.0000 mg | ORAL_TABLET | Freq: Three times a day (TID) | ORAL | 0 refills | Status: DC | PRN

## 2020-12-08 MED ORDER — HYDROCODONE-ACETAMINOPHEN 5-325 MG PO TABS: 1.0000 | ORAL_TABLET | ORAL | 0 refills | Status: DC | PRN

## 2020-12-08 NOTE — Discharge Summary (Signed)
Physician Discharge Summary  Patient ID: Philip Gates MRN: [id number] DOB/AGE: 64-04-1956 64 y.o.  Admit date: 12/07/2020 Discharge date: 12/08/2020  Admission Diagnoses: lumbar radiculopathy   Discharge Diagnoses: lumbar radiculopathy   Discharged Condition: good  Hospital Course: The patient was admitted on 12/07/2020 and taken to the operating room where the patient underwent plif L3-4. The patient tolerated the procedure well and was taken to the recovery room and then to the floor in stable condition. The hospital course was routine. There were no complications. The wound remained clean dry and intact. Pt had appropriate back soreness. No complaints of leg pain or new N/T/W. The patient remained afebrile with stable vital signs, and tolerated a regular diet. The patient continued to increase activities, and pain was well controlled with oral pain medications.   Consults: None  Significant Diagnostic Studies:  Results for orders placed or performed during the hospital encounter of 12/07/20  CBC  Result Value Ref Range   WBC 13.8 (H) 4.0 - 10.5 K/uL   RBC 4.33 4.22 - 5.81 MIL/uL   Hemoglobin 14.7 13.0 - 17.0 g/dL   HCT 42.8 39.0 - 52.0 %   MCV 98.8 80.0 - 100.0 fL   MCH 33.9 26.0 - 34.0 pg   MCHC 34.3 30.0 - 36.0 g/dL   RDW 13.1 11.5 - 15.5 %   Platelets 218 150 - 400 K/uL   nRBC 0.0 0.0 - 0.2 %  Basic Metabolic Panel  Result Value Ref Range   Sodium 134 (L) 135 - 145 mmol/L   Potassium 4.1 3.5 - 5.1 mmol/L   Chloride 99 98 - 111 mmol/L   CO2 25 22 - 32 mmol/L   Glucose, Bld 106 (H) 70 - 99 mg/dL   BUN 11 8 - 23 mg/dL   Creatinine, Ser 0.93 0.61 - 1.24 mg/dL   Calcium 9.9 8.9 - 10.3 mg/dL   GFR, Estimated >60 >60 mL/min   Anion gap 10 5 - 15  Protime-INR  Result Value Ref Range   Prothrombin Time 12.7 11.4 - 15.2 seconds   INR 1.0 0.8 - 1.2  APTT  Result Value Ref Range   aPTT 25 24 - 36 seconds    DG Lumbar Spine 2-3 Views  Result Date: 12/07/2020 CLINICAL DATA:   L3-L4 PLIF. EXAM: DG C-ARM 1-60 MIN; LUMBAR SPINE - 2-3 VIEW FLUOROSCOPY TIME:  Fluoroscopy Time:  39 seconds Radiation Exposure Index (if provided by the fluoroscopic device): 29.19 mGy. Number of Acquired Spot Images: 3 COMPARISON:  MRI 09/06/2020. FINDINGS: Three C-arm fluoroscopic images were obtained intraoperatively and submitted for post operative interpretation. These images demonstrate bilateral pedicle screws at L3 and L4 with intervening rods and intervening L3-L4 spacer. Please see the performing provider's procedural report for further detail. IMPRESSION: L3-L4 PLIF. Electronically Signed   By: Margaretha Sheffield MD   On: 12/07/2020 10:58   DG C-Arm 1-60 Min  Result Date: 12/07/2020 CLINICAL DATA:  L3-L4 PLIF. EXAM: DG C-ARM 1-60 MIN; LUMBAR SPINE - 2-3 VIEW FLUOROSCOPY TIME:  Fluoroscopy Time:  39 seconds Radiation Exposure Index (if provided by the fluoroscopic device): 29.19 mGy. Number of Acquired Spot Images: 3 COMPARISON:  MRI 09/06/2020. FINDINGS: Three C-arm fluoroscopic images were obtained intraoperatively and submitted for post operative interpretation. These images demonstrate bilateral pedicle screws at L3 and L4 with intervening rods and intervening L3-L4 spacer. Please see the performing provider's procedural report for further detail. IMPRESSION: L3-L4 PLIF. Electronically Signed   By: Margaretha Sheffield MD  On: 12/07/2020 10:58    Antibiotics:  Anti-infectives (From admission, onward)   Start     Dose/Rate Route Frequency Ordered Stop   12/07/20 2000  vancomycin (VANCOREADY) IVPB 1000 mg/200 mL        1,000 mg 200 mL/hr over 60 Minutes Intravenous  Once 12/07/20 1325 12/07/20 2106   12/07/20 0700  vancomycin (VANCOREADY) IVPB 1500 mg/300 mL        1,500 mg 150 mL/hr over 120 Minutes Intravenous To [facility] 12/07/20 0643 12/07/20 1045      Discharge Exam: Blood pressure 133/86, pulse (!) 105, temperature 98.5 F (36.9 C), resp. rate 18, height 5\' 11"  (1.803  m), weight 100.7 kg, SpO2 93 %. Neurologic: Grossly normal Ambulating and voiding well, incision cdi  Discharge Medications:   Allergies as of 12/08/2020      Reactions   Cleocin [clindamycin] Other (See Comments)   Abdominal pain "twisted stomach"   Erythromycin    Caused a "twisted stomach"   Penicillins Rash      Medication List    TAKE these medications   aspirin EC 81 MG tablet Take 81 mg by mouth in the morning. Swallow whole.   BLACK ELDERBERRY PO Take 1 capsule by mouth in the morning and at bedtime.   Breztri Aerosphere 160-9-4.8 MCG/ACT Aero Generic drug: Budeson-Glycopyrrol-Formoterol Inhale 1 puff into the lungs in the morning and at bedtime.   cyclobenzaprine 5 MG tablet Commonly known as: FLEXERIL Take 1 tablet (5 mg total) by mouth 3 (three) times daily as needed for muscle spasms.   dexlansoprazole 60 MG capsule Commonly known as: DEXILANT Take 60 mg by mouth every morning.   DIGESTIVE ADV MULTI-STRAIN PO Take 1 capsule by mouth in the morning and at bedtime.   Dutasteride-Tamsulosin HCl 0.5-0.4 MG Caps Take 1 capsule by mouth in the morning.   famotidine 20 MG tablet Commonly known as: PEPCID Take 20 mg by mouth in the morning and at bedtime.   HYDROcodone-acetaminophen 10-325 MG tablet Commonly known as: NORCO Take 1 tablet by mouth every 6 (six) hours as needed for moderate pain. What changed: Another medication with the same name was added. Make sure you understand how and when to take each.   HYDROcodone-acetaminophen 5-325 MG tablet Commonly known as: NORCO/VICODIN Take 1 tablet by mouth every 4 (four) hours as needed for moderate pain ((score 4 to 6)). What changed: You were already taking a medication with the same name, and this prescription was added. Make sure you understand how and when to take each.   IBU 800 MG tablet Generic drug: ibuprofen Take 800 mg by mouth every 8 (eight) hours as needed (pain).   lamoTRIgine 200 MG  tablet Commonly known as: LAMICTAL Take 1 tablet (200 mg total) by mouth at bedtime.   levocetirizine 5 MG tablet Commonly known as: XYZAL Take 5 mg by mouth at bedtime.   lisdexamfetamine 70 MG capsule Commonly known as: Vyvanse Take 1 capsule (70 mg total) by mouth daily.   lisdexamfetamine 70 MG capsule Commonly known as: Vyvanse Take 1 capsule (70 mg total) by mouth daily.   lisdexamfetamine 70 MG capsule Commonly known as: Vyvanse Take 1 capsule (70 mg total) by mouth daily.   metoprolol succinate 50 MG 24 hr tablet Commonly known as: TOPROL-XL Take 50 mg by mouth at bedtime.   mometasone 0.1 % cream Commonly known as: ELOCON Apply 1 application topically daily as needed (skin irritation).   montelukast 10 MG tablet Commonly known  as: SINGULAIR Take 10 mg by mouth every morning.   multivitamin with minerals tablet Take 1 tablet by mouth daily. Multivitamin for Women (needs the iron)   MUSCLE CRAMP COMPLEX PO Take 1 capsule by mouth in the morning and at bedtime. Cramp Defense Magnesium for Leg Cramps, Muscle Cramps & Muscle Spasms.   Nasal Cleanse Rinse Mix Pack Place 1 Dose into the nose daily. W/Azithromycin   OVER THE COUNTER MEDICATION Take 1 capsule by mouth in the morning. Ketones BHB   ProAir HFA 108 (90 Base) MCG/ACT inhaler Generic drug: albuterol Inhale 1-2 puffs into the lungs every 6 (six) hours as needed for shortness of breath or wheezing.   promethazine 25 MG tablet Commonly known as: PHENERGAN Take 25 mg by mouth every 6 (six) hours as needed for nausea or vomiting.   Prostate Health Caps Take 1 capsule by mouth in the morning and at bedtime.   tadalafil 20 MG tablet Commonly known as: CIALIS Take 20 mg by mouth at bedtime as needed for erectile dysfunction.   telmisartan 40 MG tablet Commonly known as: MICARDIS Take 40 mg by mouth in the morning.   testosterone cypionate 200 MG/ML injection Commonly known as: DEPOTESTOSTERONE  CYPIONATE Inject 200 mg into the muscle every 14 (fourteen) days.   vitamin C 1000 MG tablet Take 1,000 mg by mouth in the morning and at bedtime.       Disposition: home   Final Dx: PLIF L3-4  Discharge Instructions     Remove dressing in 72 hours   Complete by: As directed    Call MD for:  difficulty breathing, headache or visual disturbances   Complete by: As directed    Call MD for:  hives   Complete by: As directed    Call MD for:  persistant nausea and vomiting   Complete by: As directed    Call MD for:  redness, tenderness, or signs of infection (pain, swelling, redness, odor or green/yellow discharge around incision site)   Complete by: As directed    Call MD for:  severe uncontrolled pain   Complete by: As directed    Call MD for:  temperature >100.4   Complete by: As directed    Diet - low sodium heart healthy   Complete by: As directed    Increase activity slowly   Complete by: As directed          Signed: Ocie Cornfield Vere Diantonio 12/08/2020, 12:14 PM

## 2020-12-08 NOTE — Evaluation (Signed)
Physical Therapy Evaluation & Discharge Patient Details Name: Philip Gates MRN: [id number] DOB: 05-02-1956 Today's Date: 12/08/2020   History of Present Illness  Pt is a 65 y.o. male who presented 4/1 with progressively worsening low back and bil lower extremity pain. He has failed conservative treatments. 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 S/p lumbar decompression fusion at L3-4 on 4/1. PMH: venous insufficiency, chronic neck pain, HTN, hearing loss, dyspnea, COPD, cerebral aneurysm, cellulitis, BPH, bipolar 1 disorder, asthma, arthritis, and ADHD.  Clinical Impression  Pt presents with condition above. Pt appears to be functioning at his baseline, ambulating without UE support and negotiating a flight of stairs using 1 rail with reciprocal gait pattern without LOB or evidence of imbalance. Educated pt on safe transfers, bed mobility, and car transfers while maintaining spinal precautions, with pt demonstrating compliance. Provided handout and educated pt on spinal precautions and BLT rules. Educated pt on donning/doffing brace, with pt able to demonstrate understanding. No further PT needs necessary at this time. Thank you for this referral. PT will sign off at this time.    Follow Up Recommendations No PT follow up    Equipment Recommendations  None recommended by PT    Recommendations for Other Services       Precautions / Restrictions Precautions Precautions: Fall;Back Precaution Booklet Issued: Yes (comment) Precaution Comments: Reviewed spinal precautions, BLT Required Braces or Orthoses: Spinal Brace Spinal Brace: Applied in sitting position;Lumbar corset (aspen lumbar brace) Restrictions Weight Bearing Restrictions: No      Mobility  Bed Mobility Overal bed mobility: Independent             General bed mobility comments: Pt demonstratiung good log roll and sidelying <> sit techniques, following spinal precautions, with bed flat and no use of  rails.    Transfers Overall transfer level: Independent Equipment used: None             General transfer comment: Pt able to come to stand with slightly increased time due to low back pain, but no trunk sway or LOB.  Ambulation/Gait Ambulation/Gait assistance: Supervision Gait Distance (Feet): 400 Feet Assistive device: None Gait Pattern/deviations: WFL(Within Functional Limits) Gait velocity: WFL   General Gait Details: No drastic gait deviations and no LOB. Pt ambulates and turns safely. Supervision for safety.  Stairs Stairs: Yes Stairs assistance: Min guard;Supervision Stair Management: One rail Right;One rail Left;Alternating pattern;Forwards Number of Stairs: 10 General stair comments: Ascends and descends with reciprocal pattern and use of 1 rail, no LOB. Min guard-supervision for safety.  Wheelchair Mobility    Modified Rankin (Stroke Patients Only)       Balance Overall balance assessment: No apparent balance deficits (not formally assessed)                                           Pertinent Vitals/Pain Pain Assessment: 0-10 Pain Score: 7  Pain Location: surgical site; back Pain Descriptors / Indicators: Aching;Operative site guarding;Guarding;Grimacing;Discomfort;Throbbing Pain Intervention(s): Limited activity within patient's tolerance;Monitored during session;Repositioned    Home Living Family/patient expects to be discharged to:: Private residence Living Arrangements: Spouse/significant other Available Help at Discharge: Family;Available 24 hours/day Type of Home: House Home Access: Stairs to enter Entrance Stairs-Rails: None Entrance Stairs-Number of Steps: 1 Home Layout: One level Home Equipment: Shower seat - built in;Bedside commode;Cane - quad;Walker - 2 wheels;Walker - 4 wheels;Hand held  shower head      Prior Function Level of Independence: Independent         Comments: Pt driving. Pt independent with all  functional mobility and ADLs without AD/AE. Pt likes to take care of his grandkids.     Hand Dominance   Dominant Hand: Right    Extremity/Trunk Assessment   Upper Extremity Assessment Upper Extremity Assessment: Overall WFL for tasks assessed (denies numbness/tingling; bil 4+ to 5 MMT grossly; intact coordination)    Lower Extremity Assessment Lower Extremity Assessment: Overall WFL for tasks assessed (denies numbness/tingling; bil 4+ to 5 MMT grossly; intact coordination)    Cervical / Trunk Assessment Cervical / Trunk Assessment: Other exceptions Cervical / Trunk Exceptions: spinal surgery  Communication   Communication: No difficulties  Cognition Arousal/Alertness: Awake/alert Behavior During Therapy: WFL for tasks assessed/performed Overall Cognitive Status: Within Functional Limits for tasks assessed                                        General Comments General comments (skin integrity, edema, etc.): Educated pt on car transfers and donning/doffing brace, with pt verbalizing or demonstrating understanding.    Exercises     Assessment/Plan    PT Assessment Patent does not need any further PT services  PT Problem List         PT Treatment Interventions      PT Goals (Current goals can be found in the Care Plan section)  Acute Rehab PT Goals Patient Stated Goal: to go home to grandkids PT Goal Formulation: With patient Time For Goal Achievement: 12/09/20 Potential to Achieve Goals: Good    Frequency     Barriers to discharge        Co-evaluation               AM-PAC PT "6 Clicks" Mobility  Outcome Measure Help needed turning from your back to your side while in a flat bed without using bedrails?: None Help needed moving from lying on your back to sitting on the side of a flat bed without using bedrails?: None Help needed moving to and from a bed to a chair (including a wheelchair)?: None Help needed standing up from a chair  using your arms (e.g., wheelchair or bedside chair)?: None Help needed to walk in hospital room?: None Help needed climbing 3-5 steps with a railing? : None 6 Click Score: 24    End of Session Equipment Utilized During Treatment: Gait belt;Back brace Activity Tolerance: Patient tolerated treatment well Patient left: in chair;with call bell/phone within reach Nurse Communication: Mobility status;Patient requests pain meds (no need for chair alarm) PT Visit Diagnosis: Pain;Muscle weakness (generalized) (M62.81) Pain - Right/Left:  (back) Pain - part of body:  (back)    Time: 7048-8891 PT Time Calculation (min) (ACUTE ONLY): 50 min   Charges:   PT Evaluation $PT Eval Low Complexity: 1 Low PT Treatments $Gait Training: 8-22 mins $Therapeutic Activity: 8-22 mins        Moishe Spice, PT, DPT Acute Rehabilitation Services  Pager: [phone] Office: [phone]   Orvan Falconer 12/08/2020, 9:45 AM

## 2020-12-08 NOTE — Evaluation (Signed)
Occupational Therapy Evaluation Patient Details Name: Philip Gates MRN: [id number] DOB: Jun 19, 1956 Today's Date: 12/08/2020    History of Present Illness Pt is a 65 y.o. male who presented 4/1 with progressively worsening low back and bil lower extremity pain. He has failed conservative treatments. 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 S/p lumbar decompression fusion at L3-4 on 4/1. PMH: venous insufficiency, chronic neck pain, HTN, hearing loss, dyspnea, COPD, cerebral aneurysm, cellulitis, BPH, bipolar 1 disorder, asthma, arthritis, and ADHD.   Clinical Impression   Patient evaluated by Occupational Therapy with no further acute OT needs identified. All education has been completed and the patient has no further questions. See below for any follow-up Occupational Therapy or equipment needs. OT to sign off. Thank you for referral.      Follow Up Recommendations  No OT follow up    Equipment Recommendations  None recommended by OT    Recommendations for Other Services       Precautions / Restrictions Precautions Precautions: Fall;Back Precaution Booklet Issued: Yes (comment) Precaution Comments: handout in room and reviewed back precautions for adls Required Braces or Orthoses: Spinal Brace Spinal Brace: Lumbar corset;Applied in standing position (aspen lumbar brace) Restrictions Weight Bearing Restrictions: No      Mobility Bed Mobility Overal bed mobility: Independent             General bed mobility comments: oob in chair restless on arrival    Transfers Overall transfer level: Independent Equipment used: None             General transfer comment: pt requires use of hands on arm rest to push up into standing    Balance Overall balance assessment: No apparent balance deficits (not formally assessed)                                         ADL either performed or assessed with clinical judgement   ADL Overall  ADL's : Needs assistance/impaired Eating/Feeding: Modified independent   Grooming: Wash/dry face               Lower Body Dressing: Supervision/safety;Sit to/from stand;Adhering to back precautions Lower Body Dressing Details (indicate cue type and reason): educated on sitting to figure 4 cross. pt has x2 reachers at home. pt describes standing and stepping into shorts prior to OT arrival. Toilet Transfer: Supervision/safety       Tub/ Shower Transfer: English as a second language teacher Details (indicate cue type and reason): educated on sitting for LB bathing and for decrease fall risk. pt educated on use of warm water instead of hot water Functional mobility during ADLs: Supervision/safety    Back handout provided and reviewed adls in detail. Pt educated on: clothing between brace, never sleep in brace, set an alarm at night for medication, avoid sitting for long periods of time, correct bed positioning for sleeping, correct sequence for bed mobility, avoiding lifting more than 5 pounds and never wash directly over incision. All education is complete and patient indicates understanding.    Vision Baseline Vision/History: No visual deficits       Perception     Praxis      Pertinent Vitals/Pain Pain Assessment: 0-10 Pain Score: 8  Pain Location: surgical site; back Pain Descriptors / Indicators: Aching;Operative site guarding;Guarding;Grimacing;Discomfort;Throbbing Pain Intervention(s): Monitored during session;Repositioned;Patient requesting pain meds-RN notified;Other (comment) (repositioned and RN notified of patients needs)  Hand Dominance Right   Extremity/Trunk Assessment Upper Extremity Assessment Upper Extremity Assessment: Overall WFL for tasks assessed   Lower Extremity Assessment Lower Extremity Assessment: Overall WFL for tasks assessed   Cervical / Trunk Assessment Cervical / Trunk Assessment: Other exceptions Cervical / Trunk Exceptions: spinal  surgery   Communication Communication Communication: No difficulties   Cognition Arousal/Alertness: Awake/alert Behavior During Therapy: WFL for tasks assessed/performed Overall Cognitive Status: Impaired/Different from baseline Area of Impairment: Memory                     Memory: Decreased recall of precautions         General Comments: pt with poor recall of precautions with functional task.   General Comments  educated on back precautions with adls. educated on positioning of brace and washing brace    Exercises     Shoulder Instructions      Home Living Family/patient expects to be discharged to:: Private residence Living Arrangements: Spouse/significant other Available Help at Discharge: Family;Available 24 hours/day Type of Home: House Home Access: Stairs to enter [employer] of Steps: 1 Entrance Stairs-Rails: None Home Layout: One level     Bathroom Shower/Tub: Occupational psychologist: Handicapped height     Home Equipment: Shower seat - built in;Bedside commode;Cane - quad;Walker - 2 wheels;Walker - 4 wheels;Hand held shower head   Additional Comments: will have (A) of wife      Prior Functioning/Environment Level of Independence: Independent        Comments: Pt driving. Pt independent with all functional mobility and ADLs without AD/AE. Pt likes to take care of his grandkids.        OT Problem List:        OT Treatment/Interventions:      OT Goals(Current goals can be found in the care plan section) Acute Rehab OT Goals Patient Stated Goal: to go home to grandkids OT Goal Formulation: With patient  OT Frequency:     Barriers to D/C:            Co-evaluation              AM-PAC OT "6 Clicks" Daily Activity     Outcome Measure Help from another person eating meals?: None Help from another person taking care of personal grooming?: None Help from another person toileting, which includes using toliet,  bedpan, or urinal?: None Help from another person bathing (including washing, rinsing, drying)?: None Help from another person to put on and taking off regular upper body clothing?: None Help from another person to put on and taking off regular lower body clothing?: None 6 Click Score: 24   End of Session Equipment Utilized During Treatment: Back brace Nurse Communication: Mobility status;Precautions  Activity Tolerance: Patient tolerated treatment well;Other (comment) (requesting pain medication) Patient left: in chair;with call bell/phone within reach  OT Visit Diagnosis: Unsteadiness on feet (R26.81)                Time: 8921-1941 OT Time Calculation (min): 32 min Charges:  OT General Charges $OT Visit: 1 Visit OT Evaluation $OT Eval Moderate Complexity: 1 Mod OT Treatments $Self Care/Home Management : 8-22 mins   Brynn, OTR/L  Acute Rehabilitation Services Pager: [phone] Office: [phone] .   Jeri Modena 12/08/2020, 11:03 AM

## 2020-12-08 NOTE — Progress Notes (Signed)
NEUROSURGERY PROGRESS NOTE  Doing well. Complains of appropriate back soreness. No leg pain No numbness, tingling or weakness Has not been out of bed yet Good strength and sensation Incision CDI  Temp:  [97.2 F (36.2 C)-98.5 F (36.9 C)] 98.5 F (36.9 C) (04/02 0744) Pulse Rate:  [92-119] 105 (04/02 0744) Resp:  [14-25] 18 (04/02 0744) BP: (108-146)/(82-102) 133/86 (04/02 0744) SpO2:  [92 %-97 %] 93 % (04/02 0744) FiO2 (%):  [32 %] 32 % (04/01 1048)  Plan: Wean off of dilaudid IV and transition to more PO's. OOB today and work with therapy.   Eleonore Chiquito, NP 12/08/2020 9:01 AM

## 2020-12-10 ENCOUNTER — Emergency Department (HOSPITAL_COMMUNITY)
Admission: EM | Admit: 2020-12-10 | Discharge: 2020-12-10 | Disposition: A | Discharge status: Home / Self Care | Payer: PPO | Attending: Emergency Medicine | Admitting: Emergency Medicine

## 2020-12-10 ENCOUNTER — Other Ambulatory Visit: Payer: Self-pay

## 2020-12-10 ENCOUNTER — Encounter (HOSPITAL_COMMUNITY): Payer: Self-pay | Admitting: Emergency Medicine

## 2020-12-10 DIAGNOSIS — J449 Chronic obstructive pulmonary disease, unspecified: Secondary | ICD-10-CM | POA: Insufficient documentation

## 2020-12-10 DIAGNOSIS — Z7982 Long term (current) use of aspirin: Secondary | ICD-10-CM | POA: Diagnosis not present

## 2020-12-10 DIAGNOSIS — M5441 Lumbago with sciatica, right side: Secondary | ICD-10-CM | POA: Diagnosis not present

## 2020-12-10 DIAGNOSIS — Z87891 Personal history of nicotine dependence: Secondary | ICD-10-CM | POA: Insufficient documentation

## 2020-12-10 DIAGNOSIS — M5442 Lumbago with sciatica, left side: Secondary | ICD-10-CM | POA: Diagnosis not present

## 2020-12-10 DIAGNOSIS — J45909 Unspecified asthma, uncomplicated: Secondary | ICD-10-CM | POA: Diagnosis not present

## 2020-12-10 DIAGNOSIS — Z79899 Other long term (current) drug therapy: Secondary | ICD-10-CM | POA: Diagnosis not present

## 2020-12-10 DIAGNOSIS — Z96651 Presence of right artificial knee joint: Secondary | ICD-10-CM | POA: Insufficient documentation

## 2020-12-10 DIAGNOSIS — I10 Essential (primary) hypertension: Secondary | ICD-10-CM | POA: Diagnosis not present

## 2020-12-10 DIAGNOSIS — Z96642 Presence of left artificial hip joint: Secondary | ICD-10-CM | POA: Insufficient documentation

## 2020-12-10 DIAGNOSIS — Z9889 Other specified postprocedural states: Secondary | ICD-10-CM | POA: Insufficient documentation

## 2020-12-10 DIAGNOSIS — M549 Dorsalgia, unspecified: Secondary | ICD-10-CM | POA: Diagnosis present

## 2020-12-10 MED ORDER — HYDROMORPHONE HCL 2 MG/ML IJ SOLN
2.0000 mg | Freq: Once | INTRAMUSCULAR | Status: AC
Start: 2020-12-10 — End: 2020-12-10
  Administered 2020-12-10: 2 mg via INTRAMUSCULAR
  Filled 2020-12-10: qty 1

## 2020-12-10 MED ORDER — DEXAMETHASONE SODIUM PHOSPHATE 10 MG/ML IJ SOLN
10.0000 mg | Freq: Once | INTRAMUSCULAR | Status: AC
  Administered 2020-12-10: 10 mg via INTRAMUSCULAR
  Filled 2020-12-10: qty 1

## 2020-12-10 NOTE — Discharge Instructions (Signed)
Take the pain medications prescribed by your doctor.

## 2020-12-10 NOTE — ED Triage Notes (Signed)
Pt states he had back surgery on Friday. Called his surgeon today and was ordered more medication for pain but pt states it has not gotten better. Pt states that the "Dilaudid" he received in hospital "helped the most".

## 2020-12-10 NOTE — ED Notes (Signed)
Patient seen in the ED tonight for acute back pain, discharged home, VSS, wheelchair. Patient verbalized understanding of discharge instructions and return parameters.

## 2020-12-10 NOTE — ED Provider Notes (Signed)
[facility] EMERGENCY DEPARTMENT Provider Note   CSN: [id number] Arrival date & time: 12/10/20  2034     History Chief Complaint  Patient presents with  . Back Pain    Philip Gates is a 65 y.o. male.  Pt presents to the ED today with back pain.  Pt had lumbar surgery on 4/1 by Dr. Kathyrn Sheriff.  He was d/c home with lortab.  He called Dr. Kathyrn Sheriff today and told him that his pain was worse, so he called pt in oxycodone and steroids.  Pt said his pain is still severe.  Pt has pain going down both legs, but he is able to walk and is able to have bowel movements and urinate.        Past Medical History:  Diagnosis Date  . Acid reflux   . ADHD   . 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 Arthritis   . Asthma    ACTIVITY INDUCED  . Bipolar 1 disorder ([place])   . BPH (benign prostatic hyperplasia)   . Cellulitis 05/2014   right side face  . Cerebral aneurysm    stent placement  . COPD (chronic obstructive pulmonary disease) ([place])   . Dyspnea    occaional with exertion  . GERD (gastroesophageal reflux disease)   . Hearing loss    bilateral - no hearing aids  . Hypertension   . Neck pain, chronic   . Sinus drainage   . 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  . Venous insufficiency     Patient Active Problem List   Diagnosis Date Noted  . Tachycardia 11/16/2020  . Gastroesophageal reflux disease without esophagitis 10/15/2020  . Bipolar affective disorder in remission ([place]) 10/15/2020  . 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 Seasonal allergies 10/15/2020  . Chronic pain syndrome 10/15/2020  . Mixed simple and mucopurulent chronic bronchitis ([place]) 10/15/2020  . Male erectile disorder 10/15/2020  . History of cerebral aneurysm repair 10/15/2020  . Degenerative disc disease, cervical 10/15/2020  . Benign prostatic hyperplasia without lower urinary tract symptoms 10/15/2020  .  Essential hypertension 06/27/2020  . Body mass index (BMI) 29.0-29.9, adult 05/22/2020  . Pain in joint of left shoulder 09/12/2019  . Pain in joint of left elbow 09/12/2019  . Pain in thumb joint with movement of left hand 09/12/2019  . Osteoarthritis of carpometacarpal (CMC) joint of thumb 09/12/2019  . Spinal stenosis of lumbar region without neurogenic claudication 01/19/2019  . Prolapsed lumbar disc 01/19/2019  . Stenosis of intervertebral foramina 10/04/2018  . Cervical radiculitis 10/04/2018  . Acute back pain with sciatica 07/19/2018  . Family hx of colon cancer 11/13/2016  . Absolute anemia 11/13/2016  . Varicose veins of right lower extremity with complications 54/27/0623  . Bipolar 1 disorder, mixed, moderate ([place]) 10/12/2015  . Attention deficit hyperactivity disorder (ADHD), combined type 10/12/2015  . Neck pain 09/13/2015  . Chronic low back pain 09/13/2015  . Chronic venous insufficiency 06/25/2015  . SBO (small bowel obstruction) ([place]) 01/16/2015  . OA (osteoarthritis) of knee 10/06/2014  . Intracranial aneurysm 09/25/2014  . Facial cellulitis 05/31/2014  . Asthma   . OA (osteoarthritis) of hip 01/03/2013    Past Surgical History:  Procedure Laterality Date  . cerebral stent    . COLONOSCOPY N/A 01/02/2017   Procedure: COLONOSCOPY;  Surgeon: Rogene Houston, MD;  Location: [facility];  Service: Endoscopy;  Laterality: N/A;  . dental implant  09/26/2014   upper and lower dental implant/bridges- permanent  . ESOPHAGOGASTRODUODENOSCOPY N/A 01/02/2017   Procedure: ESOPHAGOGASTRODUODENOSCOPY (EGD);  Surgeon: Rogene Houston, MD;  Location: [facility];  Service: Endoscopy;  Laterality: N/A;  910  . EYE SURGERY Left Jan. 16, 2017   Cataract  . IR ANGIO INTRA EXTRACRAN SEL INTERNAL CAROTID BILAT MOD SED  01/20/2017  . IR ANGIO VERTEBRAL SEL VERTEBRAL UNI L MOD SED  01/20/2017  . JOINT REPLACEMENT Right    Knee  . JOINT REPLACEMENT Left    Hip  . NOSE SURGERY     . RADIOLOGY WITH ANESTHESIA N/A 12/29/2014   Procedure: Embolization;  Surgeon: Consuella Lose, MD;  Location: [place];  Service: Radiology;  Laterality: N/A;  . SUBCLAVIAN ANGIOGRAM  Nov. 8, 2016  . TOTAL HIP ARTHROPLASTY Left 01/03/2013   Procedure: TOTAL HIP ARTHROPLASTY ANTERIOR APPROACH;  Surgeon: Gearlean Alf, MD;  Location: [facility];  Service: Orthopedics;  Laterality: Left;  . TOTAL KNEE ARTHROPLASTY Right 10/06/2014   Procedure: RIGHT TOTAL KNEE ARTHROPLASTY;  Surgeon: Gearlean Alf, MD;  Location: [facility];  Service: Orthopedics;  Laterality: Right;  . WISDOM TOOTH EXTRACTION         Family History  Problem Relation Age of Onset  . Arrhythmia Mother        Has pacemaker  . Hypertension Mother   . Heart disease Mother   . Mental illness Mother   . Varicose Veins Mother   . Depression Mother   . Colon cancer Father   . Cancer Father        Prostate and Colon  . Diabetes Father   . Hypertension Father   . Cancer - Colon Father   . Depression Sister     Social History   Tobacco Use  . Smoking status: Former Smoker    Packs/day: 0.50    Types: Cigarettes    Start date: 05/23/2015    Quit date: 10/09/2017    Years since quitting: 3.1  . Smokeless tobacco: Former Systems developer    Types: Snuff  . Tobacco comment: Stated "I smoke off and on"  Vaping Use  . Vaping Use: Never used  Substance Use Topics  . Alcohol use: No    Alcohol/week: 0.0 standard drinks    Comment: 10-12-15 per pt no  . Drug use: No    Comment: 10-12-15 per tp no    Home Medications Prior to Admission medications   Medication Sig Start Date End Date Taking? Authorizing Provider  Ascorbic Acid (VITAMIN C) 1000 MG tablet Take 1,000 mg by mouth in the morning and at bedtime.    [provider]  aspirin EC 81 MG tablet Take 81 mg by mouth in the morning. Swallow whole.    [provider]  BLACK ELDERBERRY PO Take 1 capsule by mouth in the morning and at bedtime.    [provider]   BREZTRI AEROSPHERE 160-9-4.8 MCG/ACT AERO Inhale 1 puff into the lungs in the morning and at bedtime. 11/16/20   [provider]  cyclobenzaprine (FLEXERIL) 5 MG tablet Take 1 tablet (5 mg total) by mouth 3 (three) times daily as needed for muscle spasms. 12/08/20   Meyran, Ocie Cornfield, NP  dexlansoprazole (DEXILANT) 60 MG capsule Take 60 mg by mouth every morning.     [provider]  Dutasteride-Tamsulosin HCl 0.5-0.4 MG CAPS Take 1 capsule by mouth in the morning.    [provider]  famotidine (PEPCID) 20 MG tablet Take 20 mg by mouth in the morning and at bedtime. 11/06/20   [provider]  Homeopathic Products (MUSCLE CRAMP COMPLEX PO) Take 1 capsule by mouth in the morning and at bedtime. Cramp Defense Magnesium for Leg Cramps, Muscle Cramps & Muscle Spasms.    [provider]  HYDROcodone-acetaminophen (NORCO) 10-325 MG tablet Take 1 tablet by mouth every 6 (six) hours as needed for moderate pain.    [provider]  HYDROcodone-acetaminophen (NORCO/VICODIN) 5-325 MG tablet Take 1 tablet by mouth every 4 (four) hours as needed for moderate pain ((score 4 to 6)). 12/08/20   Meyran, Ocie Cornfield, NP  IBU 800 MG tablet Take 800 mg by mouth every 8 (eight) hours as needed (pain). 11/16/20   [provider]  lamoTRIgine (LAMICTAL) 200 MG tablet Take 1 tablet (200 mg total) by mouth at bedtime. 10/12/20   Cloria Spring, MD  levocetirizine (XYZAL) 5 MG tablet Take 5 mg by mouth at bedtime. 11/10/16   [provider]  lisdexamfetamine (VYVANSE) 70 MG capsule Take 1 capsule (70 mg total) by mouth daily. Patient not taking: Reported on 11/27/2020 10/12/20   Cloria Spring, MD  lisdexamfetamine (VYVANSE) 70 MG capsule Take 1 capsule (70 mg total) by mouth daily. Patient not taking: Reported on 11/27/2020 10/12/20   Cloria Spring, MD  lisdexamfetamine (VYVANSE) 70 MG capsule Take 1 capsule (70 mg total) by mouth daily. 10/12/20   Cloria Spring, MD  metoprolol succinate (TOPROL-XL) 50 MG 24 hr tablet Take 50 mg by mouth at bedtime. 11/16/20   [provider]  Misc Natural Product Nasal (NASAL CLEANSE RINSE MIX) PACK Place 1 Dose into the nose daily. W/Azithromycin    [provider]  Misc Natural Products (PROSTATE HEALTH) CAPS Take 1 capsule by mouth in the morning and at bedtime.    [provider]  mometasone (ELOCON) 0.1 % cream Apply 1 application topically daily as needed (skin irritation). 10/15/20   [provider]  montelukast (SINGULAIR) 10 MG tablet Take 10 mg by mouth every morning.    [provider]  Multiple Vitamins-Minerals (MULTIVITAMIN WITH MINERALS) tablet Take 1 tablet by mouth daily. Multivitamin for Women (needs the iron)    [provider]  OVER THE COUNTER MEDICATION Take 1 capsule by mouth in the morning. Ketones BHB    [provider]  PROAIR HFA 108 (90 Base) MCG/ACT inhaler Inhale 1-2 puffs into the lungs every 6 (six) hours as needed for shortness of breath or wheezing. 10/21/17   [provider]  Probiotic Product (DIGESTIVE ADV MULTI-STRAIN PO) Take 1 capsule by mouth in the morning and at bedtime.    [provider]  promethazine (PHENERGAN) 25 MG tablet Take 25 mg by mouth every 6 (six) hours as needed for nausea or vomiting.    [provider]  tadalafil (CIALIS) 20 MG tablet Take 20 mg by mouth at bedtime as needed for erectile dysfunction.    [provider]  telmisartan (MICARDIS) 40 MG tablet Take 40 mg by mouth in the morning. 09/14/20   [provider]  testosterone cypionate (DEPOTESTOTERONE CYPIONATE) 200 MG/ML injection Inject 200 mg into the muscle every 14 (fourteen) days.  12/16/14   [provider]    Allergies    Cleocin [clindamycin], Erythromycin, Iodine, and Penicillins  Review of Systems   Review of Systems  Musculoskeletal: Positive for back pain.  All other  systems reviewed and are negative.   Physical Exam Updated Vital Signs BP 140/82   Pulse 92   Temp 98.2 F (36.8 C) (Oral)   Resp (!) 22   Ht 5\' 11"  (1.803 m)   Wt 101 kg   SpO2 96%   BMI 31.06 kg/m   Physical Exam Vitals and nursing note reviewed.  Constitutional:      Appearance: Normal appearance.  HENT:     Head: Normocephalic and atraumatic.     Right Ear: External ear normal.     Left Ear: External ear normal.     Nose: Nose normal.     Mouth/Throat:     Mouth: Mucous membranes are moist.     Pharynx: Oropharynx is clear.  Eyes:     Extraocular Movements: Extraocular movements intact.     Conjunctiva/sclera: Conjunctivae normal.     Pupils: Pupils are equal, round, and reactive to light.  Cardiovascular:     Rate and Rhythm: Normal rate and regular rhythm.     Pulses: Normal pulses.     Heart sounds: Normal heart sounds.  Pulmonary:     Effort: Pulmonary effort is normal.     Breath sounds: Normal breath sounds.  Abdominal:     General: Abdomen is flat. Bowel sounds are normal.     Palpations: Abdomen is soft.  Musculoskeletal:        General: Normal range of motion.     Cervical back: Normal range of motion and neck supple.  Skin:    General: Skin is warm.     Capillary Refill: Capillary refill takes less than 2 seconds.  Neurological:     General: No focal deficit present.     Mental Status: He is alert.     Comments: + str leg raise bilaterally  Psychiatric:        Mood and Affect: Mood normal.        Behavior: Behavior normal.        Thought Content: Thought content normal.        Judgment: Judgment normal.     ED Results / Procedures / Treatments   Labs (all labs ordered are listed, but only abnormal results are displayed) Labs Reviewed - No data to display  EKG None  Radiology No results found.  Procedures Procedures   Medications Ordered in ED Medications  HYDROmorphone (DILAUDID) injection 2 mg (2 mg Intramuscular Given 12/10/20  2202)  dexamethasone (DECADRON) injection 10 mg (10 mg Intramuscular Given 12/10/20 2202)    ED Course  I have reviewed the triage vital signs and the nursing notes.  Pertinent labs & imaging results that were available during my care of the patient were reviewed by me and considered in my medical decision making (see chart for details).    MDM Rules/Calculators/A&P                          Pt given dilaudid and decadron and is ready to go.  He knows to return if worse. F/u with Dr. Kathyrn Sheriff.    Final Clinical Impression(s) / ED Diagnoses Final diagnoses:  Acute bilateral low back pain with bilateral sciatica    Rx / DC Orders ED Discharge Orders    None       Isla Pence, MD 12/10/20 2226

## 2020-12-12 ENCOUNTER — Emergency Department (HOSPITAL_COMMUNITY)
Admission: EM | Admit: 2020-12-12 | Discharge: 2020-12-12 | Disposition: A | Discharge status: Home / Self Care | Payer: PPO | Attending: Emergency Medicine | Admitting: Emergency Medicine

## 2020-12-12 ENCOUNTER — Other Ambulatory Visit: Payer: Self-pay

## 2020-12-12 ENCOUNTER — Encounter (HOSPITAL_COMMUNITY): Payer: Self-pay

## 2020-12-12 DIAGNOSIS — Z96651 Presence of right artificial knee joint: Secondary | ICD-10-CM | POA: Insufficient documentation

## 2020-12-12 DIAGNOSIS — Z79899 Other long term (current) drug therapy: Secondary | ICD-10-CM | POA: Insufficient documentation

## 2020-12-12 DIAGNOSIS — J45909 Unspecified asthma, uncomplicated: Secondary | ICD-10-CM | POA: Insufficient documentation

## 2020-12-12 DIAGNOSIS — J449 Chronic obstructive pulmonary disease, unspecified: Secondary | ICD-10-CM | POA: Diagnosis not present

## 2020-12-12 DIAGNOSIS — I10 Essential (primary) hypertension: Secondary | ICD-10-CM | POA: Diagnosis not present

## 2020-12-12 DIAGNOSIS — Z7951 Long term (current) use of inhaled steroids: Secondary | ICD-10-CM | POA: Diagnosis not present

## 2020-12-12 DIAGNOSIS — M5442 Lumbago with sciatica, left side: Secondary | ICD-10-CM | POA: Diagnosis not present

## 2020-12-12 DIAGNOSIS — Z96642 Presence of left artificial hip joint: Secondary | ICD-10-CM | POA: Insufficient documentation

## 2020-12-12 DIAGNOSIS — M5441 Lumbago with sciatica, right side: Secondary | ICD-10-CM | POA: Insufficient documentation

## 2020-12-12 DIAGNOSIS — G8918 Other acute postprocedural pain: Secondary | ICD-10-CM | POA: Insufficient documentation

## 2020-12-12 DIAGNOSIS — M545 Low back pain, unspecified: Secondary | ICD-10-CM | POA: Diagnosis present

## 2020-12-12 DIAGNOSIS — Z7982 Long term (current) use of aspirin: Secondary | ICD-10-CM | POA: Diagnosis not present

## 2020-12-12 DIAGNOSIS — Z87891 Personal history of nicotine dependence: Secondary | ICD-10-CM | POA: Diagnosis not present

## 2020-12-12 MED ORDER — DIAZEPAM 5 MG PO TABS
5.0000 mg | ORAL_TABLET | Freq: Once | ORAL | Status: AC
  Administered 2020-12-12: 5 mg via ORAL
  Filled 2020-12-12: qty 1

## 2020-12-12 MED ORDER — HYDROMORPHONE HCL 4 MG PO TABS: 4.0000 mg | ORAL_TABLET | Freq: Four times a day (QID) | ORAL | 0 refills | Status: DC | PRN

## 2020-12-12 MED ORDER — HYDROMORPHONE HCL 2 MG/ML IJ SOLN
2.0000 mg | Freq: Once | INTRAMUSCULAR | Status: AC
Start: 2020-12-12 — End: 2020-12-12
  Administered 2020-12-12: 2 mg via INTRAMUSCULAR
  Filled 2020-12-12: qty 1

## 2020-12-12 NOTE — ED Notes (Signed)
Pt reports his pain is unbearable and he cannot go home, requesting pain medication before discharge, EDP made aware, orders placed for 5mg  Valium STAT

## 2020-12-12 NOTE — Op Note (Signed)
NEUROSURGERY OPERATIVE NOTE   PREOP DIAGNOSIS:  1. Lumbar spinal stenosis with neurogenic claudication, L3-4  POSTOP DIAGNOSIS: Same  PROCEDURE: 1. L3 laminectomy with facetectomy for decompression of exiting nerve roots, more than would be required for placement of interbody graft 2. Placement of anterior interbody device - Medtronic expandable 13mm cage x2 3. Posterior non-segmental instrumentation using cortical pedicle screws at L3 - L4 4. Interbody arthrodesis, L3-4 5. Use of locally harvested bone autograft 6. Use of non-structural bone allograft - Proteos  SURGEON: Dr. Consuella Lose, MD  ASSISTANT: Ferne Reus, PA-C  ANESTHESIA: General Endotracheal  EBL: 200cc  SPECIMENS: None  DRAINS: None  COMPLICATIONS: None immediate  CONDITION: Hemodynamically stable to PACU  HISTORY: Philip Gates is a 65 y.o. male Initially followed in the outpatient Neurosurgery Clinic with progressively worsening low back and bilateral leg pain consistent with neurogenic claudication.  He had attempted multiple different conservative treatments with progression of his pain.  He elected to then proceed with lumbar decompression and fusion.  The risks, benefits, and alternatives to surgery were reviewed in detail with the patient.  After all his questions were answered informed consent was obtained and witnessed.  PROCEDURE IN DETAIL: The patient was brought to the operating room via stretcher. After induction of general anesthesia, the patient was positioned on the operative table in the prone position. All pressure points were meticulously padded. Incision was then marked out and prepped and draped in the usual sterile fashion.  After timeout was conducted, skin was infiltrated with local anesthetic. Spinal needle was introduced to identify the L3-4 interspace. Skin incision was then made sharply and Bovie electrocautery was used to dissect the subcutaneous tissue until the lumbodorsal  fascia was identified and incised. The muscle was then elevated in the subperiosteal plane and the L3 and L4 lamina and L3-4 facet complexes were identified. Self-retaining retractors were then placed. Lateral fluoroscopy was taken with a dissector in the L3-4 interspace to confirm our location.  At this point attention was turned to decompression. Complete L3 laminectomy was completed with a high-speed drill and Kerrison punches.  Normal dura was identified.  A ball-tipped dissector was then used to identify the foramina bilaterally.  High-speed drill was used to cut across the pars interarticularis and the inferior articulating process of L3 was removed bilaterally.  The exiting nerve roots and the traversing nerve roots were then identified.  Kerrison punches were used along the L3 roots and to remvoe the medial aspect of the L4 SAP for good decompression. I was then able to easily pass a ball dissector in the ventral epidural space and underneath the bilateral L3 and L4 nerves indicating good decompression.   Disc space was then identified, incised bilaterally, and using a combination of shavers, curettes and rongeurs, complete discectomy was completed. Endplates were prepared, and bone harvested during decompression was mixed with Proteos and packed into the interspace. A 71mm expandable cage was tapped into place bilaterally. cages were expanded to achieve good endplate apposition.  Good position was confirmed with fluoroscopy.  At this point, the entry points for bilateral L3 and L4 cortical pedicle screws were identified using standard anatomic landmarks and lateral fluoro. Pilot holes were then drilled and tapped to 5.5 x 89mm. Screws were then placed in L3 and L4. Prebent lordotic rod was then sized and placed into the pedicle screws. Set screws were placed and final tightened. Final AP and lateral fluoroscopic images confirmed good position.  Hemostasis was secured and confirmed with  bipolar  cautery and morcellized gelfoam with thrombin. The wound was then irrigated with copious amounts of antibiotic saline, then closed in standard fashion using a combination of interrupted 0 and 3-0 Vicryl stitches in the muscular, fascial, and subcutaneous layers. Skin was then closed using standard Dermabond. Sterile dressing was then applied. The patient was then transferred to the stretcher, extubated, and taken to the postanesthesia care unit in stable hemodynamic condition.  At the end of the case all sponge, needle, cottonoid, and instrument counts were correct.   Consuella Lose, MD [facility]

## 2020-12-12 NOTE — ED Triage Notes (Signed)
Pt to er, pt states that Friday he had a spinal fusion. States that he was here on the 4th for back and he was given a shot of pain medication. States that the medication helped, states that he tried to call his surgeon Monday and they called in a prescription for oxycodone for him along with a steroid, states he took them with minimal relief, states that he is here today because his pain is worse and the pain medications aren't giving him any relief.  States that he tried to call his doc, but they aren't calling him back.

## 2020-12-12 NOTE — ED Provider Notes (Signed)
[facility] EMERGENCY DEPARTMENT Provider Note   CSN: [id number] Arrival date & time: 12/12/20  2007     History Chief Complaint  Patient presents with  . Back Pain    Philip Gates is a 65 y.o. male.  He has a history of back pain and had surgery by Dr. Nundkumar 5 days ago.  He was discharged with hydrocodone and his pain was worse so switched to oxycodone and steroids.  He was seen in the emergency department 2 days ago for same and given a shot of Dilaudid and Decadron.  He said that helped but the oxycodone still is not covering his pain.  He denies any new deficits.  No fever.  No incontinence.  He was unable to reach his doctor today so he is back here tonight for pain control.  The history is provided by the patient.  Back Pain Location:  Lumbar spine Quality:  Stabbing Radiates to:  R posterior upper leg and L posterior upper leg Pain severity:  Severe Pain is:  Same all the time Onset quality:  Gradual Duration:  5 days Timing:  Constant Progression:  Unchanged Context: not falling   Relieved by:  Nothing Worsened by:  Movement Ineffective treatments:  Narcotics Associated symptoms: paresthesias (chronic neuropathy)   Associated symptoms: no abdominal pain, no bladder incontinence, no bowel incontinence, no chest pain, no dysuria, no fever and no weakness   Risk factors: recent surgery        Past Medical History:  Diagnosis Date  . Acid reflux   . ADHD   . 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 Arthritis   . Asthma    ACTIVITY INDUCED  . Bipolar 1 disorder ([place])   . BPH (benign prostatic hyperplasia)   . Cellulitis 05/2014   right side face  . Cerebral aneurysm    stent placement  . COPD (chronic obstructive pulmonary disease) ([place])   . Dyspnea    occaional with exertion  . GERD (gastroesophageal reflux disease)   . Hearing loss    bilateral - no hearing aids  . Hypertension   . Neck pain,  chronic   . Sinus drainage   . 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  . Venous insufficiency     Patient Active Problem List   Diagnosis Date Noted  . Tachycardia 11/16/2020  . Gastroesophageal reflux disease without esophagitis 10/15/2020  . Bipolar affective disorder in remission ([place]) 10/15/2020  . 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 Seasonal allergies 10/15/2020  . Chronic pain syndrome 10/15/2020  . Mixed simple and mucopurulent chronic bronchitis ([place]) 10/15/2020  . Male erectile disorder 10/15/2020  . History of cerebral aneurysm repair 10/15/2020  . Degenerative disc disease, cervical 10/15/2020  . Benign prostatic hyperplasia without lower urinary tract symptoms 10/15/2020  . Essential hypertension 06/27/2020  . Body mass index (BMI) 29.0-29.9, adult 05/22/2020  . Pain in joint of left shoulder 09/12/2019  . Pain in joint of left elbow 09/12/2019  . Pain in thumb joint with movement of left hand 09/12/2019  . Osteoarthritis of carpometacarpal (CMC) joint of thumb 09/12/2019  . Spinal stenosis of lumbar region without neurogenic claudication 01/19/2019  . Prolapsed lumbar disc 01/19/2019  . Stenosis of intervertebral foramina 10/04/2018  . Cervical radiculitis 10/04/2018  . Acute back pain with sciatica 07/19/2018  . Family hx of colon cancer 11/13/2016  . Absolute anemia  11/13/2016  . Varicose veins of right lower extremity with complications 82/50/5397  . Bipolar 1 disorder, mixed, moderate ([place]) 10/12/2015  . Attention deficit hyperactivity disorder (ADHD), combined type 10/12/2015  . Neck pain 09/13/2015  . Chronic low back pain 09/13/2015  . Chronic venous insufficiency 06/25/2015  . SBO (small bowel obstruction) ([place]) 01/16/2015  . OA (osteoarthritis) of knee 10/06/2014  . Intracranial aneurysm 09/25/2014  . Facial cellulitis 05/31/2014  . Asthma   . OA (osteoarthritis) of hip 01/03/2013    Past Surgical History:   Procedure Laterality Date  . cerebral stent    . COLONOSCOPY N/A 01/02/2017   Procedure: COLONOSCOPY;  Surgeon: Rogene Houston, MD;  Location: [facility];  Service: Endoscopy;  Laterality: N/A;  . dental implant  09/26/2014   upper and lower dental implant/bridges- permanent  . ESOPHAGOGASTRODUODENOSCOPY N/A 01/02/2017   Procedure: ESOPHAGOGASTRODUODENOSCOPY (EGD);  Surgeon: Rogene Houston, MD;  Location: [facility];  Service: Endoscopy;  Laterality: N/A;  910  . EYE SURGERY Left Jan. 16, 2017   Cataract  . IR ANGIO INTRA EXTRACRAN SEL INTERNAL CAROTID BILAT MOD SED  01/20/2017  . IR ANGIO VERTEBRAL SEL VERTEBRAL UNI L MOD SED  01/20/2017  . JOINT REPLACEMENT Right    Knee  . JOINT REPLACEMENT Left    Hip  . NOSE SURGERY    . RADIOLOGY WITH ANESTHESIA N/A 12/29/2014   Procedure: Embolization;  Surgeon: Consuella Lose, MD;  Location: [facility];  Service: Radiology;  Laterality: N/A;  . SUBCLAVIAN ANGIOGRAM  Nov. 8, 2016  . TOTAL HIP ARTHROPLASTY Left 01/03/2013   Procedure: TOTAL HIP ARTHROPLASTY ANTERIOR APPROACH;  Surgeon: Gearlean Alf, MD;  Location: [facility];  Service: Orthopedics;  Laterality: Left;  . TOTAL KNEE ARTHROPLASTY Right 10/06/2014   Procedure: RIGHT TOTAL KNEE ARTHROPLASTY;  Surgeon: Gearlean Alf, MD;  Location: [facility];  Service: Orthopedics;  Laterality: Right;  . WISDOM TOOTH EXTRACTION         Family History  Problem Relation Age of Onset  . Arrhythmia Mother        Has pacemaker  . Hypertension Mother   . Heart disease Mother   . Mental illness Mother   . Varicose Veins Mother   . Depression Mother   . Colon cancer Father   . Cancer Father        Prostate and Colon  . Diabetes Father   . Hypertension Father   . Cancer - Colon Father   . Depression Sister     Social History   Tobacco Use  . Smoking status: Former Smoker    Packs/day: 0.50    Types: Cigarettes    Start date: 05/23/2015    Quit date: 10/09/2017    Years since quitting: 3.1   . Smokeless tobacco: Former Systems developer    Types: Snuff  . Tobacco comment: Stated "I smoke off and on"  Vaping Use  . Vaping Use: Never used  Substance Use Topics  . Alcohol use: No    Alcohol/week: 0.0 standard drinks  . Drug use: No    Home Medications Prior to Admission medications   Medication Sig Start Date End Date Taking? Authorizing Provider  Ascorbic Acid (VITAMIN C) 1000 MG tablet Take 1,000 mg by mouth in the morning and at bedtime.    [provider]  aspirin EC 81 MG tablet Take 81 mg by mouth in the morning. Swallow whole.    [provider]  BLACK ELDERBERRY PO Take  1 capsule by mouth in the morning and at bedtime.    [provider]  BREZTRI AEROSPHERE 160-9-4.8 MCG/ACT AERO Inhale 1 puff into the lungs in the morning and at bedtime. 11/16/20   [provider]  cyclobenzaprine (FLEXERIL) 5 MG tablet Take 1 tablet (5 mg total) by mouth 3 (three) times daily as needed for muscle spasms. 12/08/20   Meyran, Ocie Cornfield, NP  dexlansoprazole (DEXILANT) 60 MG capsule Take 60 mg by mouth every morning.     [provider]  Dutasteride-Tamsulosin HCl 0.5-0.4 MG CAPS Take 1 capsule by mouth in the morning.    [provider]  famotidine (PEPCID) 20 MG tablet Take 20 mg by mouth in the morning and at bedtime. 11/06/20   [provider]  Homeopathic Products (MUSCLE CRAMP COMPLEX PO) Take 1 capsule by mouth in the morning and at bedtime. Cramp Defense Magnesium for Leg Cramps, Muscle Cramps & Muscle Spasms.    [provider]  HYDROcodone-acetaminophen (NORCO) 10-325 MG tablet Take 1 tablet by mouth every 6 (six) hours as needed for moderate pain.    [provider]  HYDROcodone-acetaminophen (NORCO/VICODIN) 5-325 MG tablet Take 1 tablet by mouth every 4 (four) hours as needed for moderate pain ((score 4 to 6)). 12/08/20   Meyran, Ocie Cornfield, NP  IBU 800 MG tablet Take 800 mg by mouth every 8 (eight) hours as  needed (pain). 11/16/20   [provider]  lamoTRIgine (LAMICTAL) 200 MG tablet Take 1 tablet (200 mg total) by mouth at bedtime. 10/12/20   Cloria Spring, MD  levocetirizine (XYZAL) 5 MG tablet Take 5 mg by mouth at bedtime. 11/10/16   [provider]  lisdexamfetamine (VYVANSE) 70 MG capsule Take 1 capsule (70 mg total) by mouth daily. Patient not taking: Reported on 11/27/2020 10/12/20   Cloria Spring, MD  lisdexamfetamine (VYVANSE) 70 MG capsule Take 1 capsule (70 mg total) by mouth daily. Patient not taking: Reported on 11/27/2020 10/12/20   Cloria Spring, MD  lisdexamfetamine (VYVANSE) 70 MG capsule Take 1 capsule (70 mg total) by mouth daily. 10/12/20   Cloria Spring, MD  metoprolol succinate (TOPROL-XL) 50 MG 24 hr tablet Take 50 mg by mouth at bedtime. 11/16/20   [provider]  Misc Natural Product Nasal (NASAL CLEANSE RINSE MIX) PACK Place 1 Dose into the nose daily. W/Azithromycin    [provider]  Misc Natural Products (PROSTATE HEALTH) CAPS Take 1 capsule by mouth in the morning and at bedtime.    [provider]  mometasone (ELOCON) 0.1 % cream Apply 1 application topically daily as needed (skin irritation). 10/15/20   [provider]  montelukast (SINGULAIR) 10 MG tablet Take 10 mg by mouth every morning.    [provider]  Multiple Vitamins-Minerals (MULTIVITAMIN WITH MINERALS) tablet Take 1 tablet by mouth daily. Multivitamin for Women (needs the iron)    [provider]  OVER THE COUNTER MEDICATION Take 1 capsule by mouth in the morning. Ketones BHB    [provider]  PROAIR HFA 108 (90 Base) MCG/ACT inhaler Inhale 1-2 puffs into the lungs every 6 (six) hours as needed for shortness of breath or wheezing. 10/21/17   [provider]  Probiotic Product (DIGESTIVE ADV MULTI-STRAIN PO) Take 1 capsule by mouth in the morning and at bedtime.    [provider]  promethazine (PHENERGAN) 25 MG  tablet Take 25 mg by mouth every 6 (six) hours as needed for  nausea or vomiting.    [provider]  tadalafil (CIALIS) 20 MG tablet Take 20 mg by mouth at bedtime as needed for erectile dysfunction.    [provider]  telmisartan (MICARDIS) 40 MG tablet Take 40 mg by mouth in the morning. 09/14/20   [provider]  testosterone cypionate (DEPOTESTOTERONE CYPIONATE) 200 MG/ML injection Inject 200 mg into the muscle every 14 (fourteen) days.  12/16/14   [provider]    Allergies    Cleocin [clindamycin], Erythromycin, Iodine, and Penicillins  Review of Systems   Review of Systems  Constitutional: Negative for fever.  HENT: Negative for sore throat.   Eyes: Negative for visual disturbance.  Respiratory: Negative for shortness of breath.   Cardiovascular: Negative for chest pain.  Gastrointestinal: Negative for abdominal pain and bowel incontinence.  Genitourinary: Negative for bladder incontinence and dysuria.  Musculoskeletal: Positive for back pain.  Skin: Negative for rash.  Neurological: Positive for paresthesias (chronic neuropathy). Negative for weakness.    Physical Exam Updated Vital Signs BP (!) 171/118 (BP Location: Right Arm)   Pulse (!) 110   Temp 98.4 F (36.9 C) (Oral)   Resp 18   Ht 5\' 10"  (1.778 m)   Wt 100.7 kg   SpO2 95%   BMI 31.85 kg/m   Physical Exam Vitals and nursing note reviewed.  Constitutional:      Appearance: Normal appearance. He is well-developed.  HENT:     Head: Normocephalic and atraumatic.  Eyes:     Conjunctiva/sclera: Conjunctivae normal.  Cardiovascular:     Rate and Rhythm: Normal rate and regular rhythm.     Heart sounds: No murmur heard.   Pulmonary:     Effort: Pulmonary effort is normal. No respiratory distress.     Breath sounds: Normal breath sounds.  Abdominal:     Palpations: Abdomen is soft.     Tenderness: There is no abdominal tenderness.  Musculoskeletal:        General:  Tenderness (Lumbar tenderness paralumbar tenderness) present. Normal range of motion.     Cervical back: Neck supple.  Skin:    General: Skin is warm and dry.  Neurological:     General: No focal deficit present.     Mental Status: He is alert.     Comments: He has normal strength in his lower extremities.  He has some stocking glove paresthesias.  Distal pulses intact.     ED Results / Procedures / Treatments   Labs (all labs ordered are listed, but only abnormal results are displayed) Labs Reviewed - No data to display  EKG None  Radiology No results found.  Procedures Procedures   Medications Ordered in ED Medications  HYDROmorphone (DILAUDID) injection 2 mg (has no administration in time range)    ED Course  I have reviewed the triage vital signs and the nursing notes.  Pertinent labs & imaging results that were available during my care of the patient were reviewed by me and considered in my medical decision making (see chart for details).  Clinical Course as of 12/13/20 1207  Wed Dec 12, 2020  2229 After the IM Dilaudid patient is still uncomfortable but states it is getting a little bit better.  He is asking for something stronger than the oxycodone.  I told him I think it is very important that he sees his neurosurgeon and he said he has been trying to but nobody was responding to his phone calls.  I will prescribe him  some Dilaudid for a few tablets but I think this is something that he needs his surgeon to reassess him for. [MB]    Clinical Course User Index [MB] Hayden Rasmussen, MD   MDM Rules/Calculators/A&P                           Final Clinical Impression(s) / ED Diagnoses Final diagnoses:  Acute bilateral low back pain with bilateral sciatica  Post-operative pain    Rx / DC Orders ED Discharge Orders         Ordered    HYDROmorphone (DILAUDID) 4 MG tablet  Every 6 hours PRN        12/12/20 2232           Hayden Rasmussen, MD 12/13/20  1208

## 2020-12-17 DIAGNOSIS — M48062 Spinal stenosis, lumbar region with neurogenic claudication: Secondary | ICD-10-CM | POA: Insufficient documentation

## 2020-12-17 DIAGNOSIS — R030 Elevated blood-pressure reading, without diagnosis of hypertension: Secondary | ICD-10-CM | POA: Insufficient documentation

## 2020-12-17 DIAGNOSIS — Z6831 Body mass index (BMI) 31.0-31.9, adult: Secondary | ICD-10-CM | POA: Insufficient documentation

## 2021-01-07 DIAGNOSIS — M48062 Spinal stenosis, lumbar region with neurogenic claudication: Secondary | ICD-10-CM | POA: Diagnosis not present

## 2021-01-10 ENCOUNTER — Telehealth (INDEPENDENT_AMBULATORY_CARE_PROVIDER_SITE_OTHER): Payer: PPO | Admitting: Psychiatry

## 2021-01-10 ENCOUNTER — Encounter (HOSPITAL_COMMUNITY): Payer: Self-pay | Admitting: Psychiatry

## 2021-01-10 ENCOUNTER — Other Ambulatory Visit: Payer: Self-pay

## 2021-01-10 DIAGNOSIS — F3162 Bipolar disorder, current episode mixed, moderate: Secondary | ICD-10-CM

## 2021-01-10 DIAGNOSIS — F900 Attention-deficit hyperactivity disorder, predominantly inattentive type: Secondary | ICD-10-CM

## 2021-01-10 MED ORDER — LISDEXAMFETAMINE DIMESYLATE 70 MG PO CAPS: 70.0000 mg | ORAL_CAPSULE | Freq: Every day | ORAL | 0 refills | Status: DC

## 2021-01-10 MED ORDER — LAMOTRIGINE 200 MG PO TABS: 200.0000 mg | ORAL_TABLET | Freq: Every day | ORAL | 2 refills | Status: DC

## 2021-01-10 NOTE — Progress Notes (Signed)
Virtual Visit via Telephone Note  I connected with Philip Gates on 01/10/21 at  9:00 AM EDT by telephone and verified that I am speaking with the correct person using two identifiers.  Location: Patient: home Provider: office   I discussed the limitations, risks, security and privacy concerns of performing an evaluation and management service by telephone and the availability of in person appointments. I also discussed with the patient that there may be a patient responsible charge related to this service. The patient expressed understanding and agreed to proceed.    I discussed the assessment and treatment plan with the patient. The patient was provided an opportunity to ask questions and all were answered. The patient agreed with the plan and demonstrated an understanding of the instructions.   The patient was advised to call back or seek an in-person evaluation if the symptoms worsen or if the condition fails to improve as anticipated.  I provided 15 minutes of non-face-to-face time during this encounter.   [name], MD  [facility] MD/PA/NP OP Progress Note  01/10/2021 9:17 AM Philip Gates  MRN:  [id number]  Chief Complaint:  Chief Complaint    Manic Behavior; ADD; Follow-up     HPI: This patient is a65 year old married white male lives with his wife in [place]. 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  He had back surgery about a month ago.  At first he was in a lot of pain but now he is doing much better and is pleased with the result.  He even thinks he can return to playing golf.  He is focusing well and his mood has been stable.  He denies being angry or irritable.  He is very pleasant and easy to talk with.  The Vyvanse continues to help with his focus Visit Diagnosis:    ICD-10-CM   1. Attention deficit hyperactivity disorder (ADHD), predominantly  inattentive type  F90.0   2. Bipolar 1 disorder, mixed, moderate (HCC)  F31.62     Past Psychiatric History: Past outpatient treatment for bipolar disorder  Past Medical History:  Past Medical History:  Diagnosis Date  . Acid reflux   . ADHD   . 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 Arthritis   . Asthma    ACTIVITY INDUCED  . Bipolar 1 disorder ([place])   . BPH (benign prostatic hyperplasia)   . Cellulitis 05/2014   right side face  . Cerebral aneurysm    stent placement  . COPD (chronic obstructive pulmonary disease) ([place])   . Dyspnea    occaional with exertion  . GERD (gastroesophageal reflux disease)   . Hearing loss    bilateral - no hearing aids  . Hypertension   . Neck pain, chronic   . Sinus drainage   . 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  . Venous insufficiency     Past Surgical History:  Procedure Laterality Date  . cerebral stent    . COLONOSCOPY N/A 01/02/2017   Procedure: COLONOSCOPY;  Surgeon: Rogene Houston, MD;  [facility];  Service: Endoscopy;  Laterality: N/A;  . dental implant  09/26/2014   upper and lower dental implant/bridges- permanent  . ESOPHAGOGASTRODUODENOSCOPY N/A 01/02/2017   Procedure: ESOPHAGOGASTRODUODENOSCOPY (EGD);  Surgeon: Hildred Laser  U, MD;  [facility];  Service: Endoscopy;  Laterality: N/A;  910  . EYE SURGERY Left Jan. 16, 2017   Cataract  . IR ANGIO INTRA EXTRACRAN SEL INTERNAL CAROTID BILAT MOD SED  01/20/2017  . IR ANGIO VERTEBRAL SEL VERTEBRAL UNI L MOD SED  01/20/2017  . JOINT REPLACEMENT Right    Knee  . JOINT REPLACEMENT Left    Hip  . NOSE SURGERY    . RADIOLOGY WITH ANESTHESIA N/A 12/29/2014   Procedure: Embolization;  Surgeon: Consuella Lose, MD;  Location: [place];  Service: Radiology;  Laterality: N/A;  . SUBCLAVIAN ANGIOGRAM  Nov. 8, 2016  . TOTAL HIP ARTHROPLASTY Left 01/03/2013   Procedure:  TOTAL HIP ARTHROPLASTY ANTERIOR APPROACH;  Surgeon: Gearlean Alf, MD;  [facility];  Service: Orthopedics;  Laterality: Left;  . TOTAL KNEE ARTHROPLASTY Right 10/06/2014   Procedure: RIGHT TOTAL KNEE ARTHROPLASTY;  Surgeon: Gearlean Alf, MD;  [facility];  Service: Orthopedics;  Laterality: Right;  . WISDOM TOOTH EXTRACTION      Family Psychiatric History: see below  Family History:  Family History  Problem Relation Age of Onset  . Arrhythmia Mother        Has pacemaker  . Hypertension Mother   . Heart disease Mother   . Mental illness Mother   . Varicose Veins Mother   . Depression Mother   . Colon cancer Father   . Cancer Father        Prostate and Colon  . Diabetes Father   . Hypertension Father   . Cancer - Colon Father   . Depression Sister     Social History:  Social History   Socioeconomic History  . Marital status: Married    Spouse name: Not on file  . Number of children: Not on file  . Years of education: Not on file  . Highest education level: Not on file  Occupational History  . Not on file  Tobacco Use  . Smoking status: Former Smoker    Packs/day: 0.50    Types: Cigarettes    Start date: 05/23/2015    Quit date: 10/09/2017    Years since quitting: 3.2  . Smokeless tobacco: Former Systems developer    Types: Snuff  . Tobacco comment: Stated "I smoke off and on"  Vaping Use  . Vaping Use: Never used  Substance and Sexual Activity  . Alcohol use: No    Alcohol/week: 0.0 standard drinks  . Drug use: No  . Sexual activity: Yes    Partners: Female  Other Topics Concern  . Not on file  Social History Narrative  . Not on file   Social Determinants of Health   Financial Resource Strain: Not on file  Food Insecurity: Not on file  Transportation Needs: Not on file  Physical Activity: Not on file  Stress: Not on file  Social Connections: Not on file    Allergies:  Allergies  Allergen Reactions  . Cleocin [Clindamycin] Other (See Comments)     Abdominal pain "twisted stomach"  . Erythromycin     Caused a "twisted stomach"  . Iodine   . Penicillins Rash    Metabolic Disorder Labs: No results found for: HGBA1C, MPG No results found for: PROLACTIN No results found for: CHOL, TRIG, HDL, CHOLHDL, VLDL, LDLCALC No results found for: TSH  Therapeutic Level Labs: No results found for: LITHIUM No results found for: VALPROATE No components found for:  CBMZ  Current Medications: Current  Outpatient Medications  Medication Sig Dispense Refill  . Ascorbic Acid (VITAMIN C) 1000 MG tablet Take 1,000 mg by mouth in the morning and at bedtime.    Marland Kitchen aspirin EC 81 MG tablet Take 81 mg by mouth in the morning. Swallow whole.    Marland Kitchen BLACK ELDERBERRY PO Take 1 capsule by mouth in the morning and at bedtime.    Marland Kitchen BREZTRI AEROSPHERE 160-9-4.8 MCG/ACT AERO Inhale 1 puff into the lungs in the morning and at bedtime.    . cyclobenzaprine (FLEXERIL) 5 MG tablet Take 1 tablet (5 mg total) by mouth 3 (three) times daily as needed for muscle spasms. 30 tablet 0  . dexlansoprazole (DEXILANT) 60 MG capsule Take 60 mg by mouth every morning.     . Dutasteride-Tamsulosin HCl 0.5-0.4 MG CAPS Take 1 capsule by mouth in the morning.    . famotidine (PEPCID) 20 MG tablet Take 20 mg by mouth in the morning and at bedtime.    . Homeopathic Products (MUSCLE CRAMP COMPLEX PO) Take 1 capsule by mouth in the morning and at bedtime. Cramp Defense Magnesium for Leg Cramps, Muscle Cramps & Muscle Spasms.    Marland Kitchen HYDROmorphone (DILAUDID) 4 MG tablet Take 1 tablet (4 mg total) by mouth every 6 (six) hours as needed for severe pain. 12 tablet 0  . IBU 800 MG tablet Take 800 mg by mouth every 8 (eight) hours as needed (pain).    Marland Kitchen lamoTRIgine (LAMICTAL) 200 MG tablet Take 1 tablet (200 mg total) by mouth at bedtime. 30 tablet 2  . levocetirizine (XYZAL) 5 MG tablet Take 5 mg by mouth at bedtime.    Marland Kitchen lisdexamfetamine (VYVANSE) 70 MG capsule Take 1 capsule (70 mg total) by mouth  daily. 30 capsule 0  . lisdexamfetamine (VYVANSE) 70 MG capsule Take 1 capsule (70 mg total) by mouth daily. 30 capsule 0  . lisdexamfetamine (VYVANSE) 70 MG capsule Take 1 capsule (70 mg total) by mouth daily. 30 capsule 0  . metoprolol succinate (TOPROL-XL) 50 MG 24 hr tablet Take 50 mg by mouth at bedtime.    . Misc Natural Product Nasal (NASAL CLEANSE RINSE MIX) PACK Place 1 Dose into the nose daily. W/Azithromycin    . Misc Natural Products (PROSTATE HEALTH) CAPS Take 1 capsule by mouth in the morning and at bedtime.    . mometasone (ELOCON) 0.1 % cream Apply 1 application topically daily as needed (skin irritation).    . montelukast (SINGULAIR) 10 MG tablet Take 10 mg by mouth every morning.    . Multiple Vitamins-Minerals (MULTIVITAMIN WITH MINERALS) tablet Take 1 tablet by mouth daily. Multivitamin for Women (needs the iron)    . OVER THE COUNTER MEDICATION Take 1 capsule by mouth in the morning. Ketones BHB    . PROAIR HFA 108 (90 Base) MCG/ACT inhaler Inhale 1-2 puffs into the lungs every 6 (six) hours as needed for shortness of breath or wheezing.    . Probiotic Product (DIGESTIVE ADV MULTI-STRAIN PO) Take 1 capsule by mouth in the morning and at bedtime.    . promethazine (PHENERGAN) 25 MG tablet Take 25 mg by mouth every 6 (six) hours as needed for nausea or vomiting.    . tadalafil (CIALIS) 20 MG tablet Take 20 mg by mouth at bedtime as needed for erectile dysfunction.    Marland Kitchen telmisartan (MICARDIS) 40 MG tablet Take 40 mg by mouth in the morning.    . testosterone cypionate (DEPOTESTOTERONE CYPIONATE) 200 MG/ML injection Inject 200 mg  into the muscle every 14 (fourteen) days.      No current facility-administered medications for this visit.     Musculoskeletal: Strength & Muscle Tone: within normal limits Gait & Station: normal Patient leans: N/A  Psychiatric Specialty Exam: Review of Systems  Musculoskeletal: Positive for back pain.  All other systems reviewed and are  negative.   There were no vitals taken for this visit.There is no height or weight on file to calculate BMI.  General Appearance:na  Eye Contact:  NA  Speech:  Clear and Coherent  Volume:  Normal  Mood:  Euthymic  Affect:  NA  Thought Process:  Goal Directed  Orientation:  Full (Time, Place, and Person)  Thought Content: WDL   Suicidal Thoughts:  No  Homicidal Thoughts:  No  Memory:  Immediate;   Good Recent;   Good Remote;   Good  Judgement:  Good  Insight:  Fair  Psychomotor Activity:  Normal  Concentration:  Concentration: Good and Attention Span: Good  Recall:  Good  Fund of Knowledge: Good  Language: Good  Akathisia:  No  Handed:  Right  AIMS (if indicated): not done  Assets:  Communication Skills Desire for Improvement Physical Health Resilience Social Support Talents/Skills  ADL's:  Intact  Cognition: WNL  Sleep:  Good   Screenings: Flowsheet Row ED from 12/12/2020 in [place] ED from 12/10/2020 in [facility] (Discharged) from 12/07/2020 in [place] Error: Question 6 not populated No Risk No Risk       Assessment and Plan: This patient is a 65 year old male with a history of bipolar disorder and ADHD.  He continues to do well.  His mood has been stable and he is focusing well.  He will continue Lamictal 200 mg at bedtime for mood stabilization and Vyvanse 70 mg every morning for ADHD.  He will return to see me in 3 months   [name], MD 01/10/2021, 9:17 AM

## 2021-01-25 DIAGNOSIS — M5412 Radiculopathy, cervical region: Secondary | ICD-10-CM | POA: Diagnosis not present

## 2021-02-13 DIAGNOSIS — J45909 Unspecified asthma, uncomplicated: Secondary | ICD-10-CM | POA: Diagnosis present

## 2021-02-15 DIAGNOSIS — R000 Tachycardia, unspecified: Secondary | ICD-10-CM | POA: Diagnosis not present

## 2021-02-15 DIAGNOSIS — M5030 Other cervical disc degeneration, unspecified cervical region: Secondary | ICD-10-CM | POA: Diagnosis not present

## 2021-02-15 DIAGNOSIS — E782 Mixed hyperlipidemia: Secondary | ICD-10-CM | POA: Insufficient documentation

## 2021-02-15 DIAGNOSIS — I10 Essential (primary) hypertension: Secondary | ICD-10-CM | POA: Diagnosis not present

## 2021-02-15 DIAGNOSIS — M5136 Other intervertebral disc degeneration, lumbar region: Secondary | ICD-10-CM | POA: Diagnosis not present

## 2021-02-15 DIAGNOSIS — Z0000 Encounter for general adult medical examination without abnormal findings: Secondary | ICD-10-CM | POA: Diagnosis not present

## 2021-02-15 DIAGNOSIS — E291 Testicular hypofunction: Secondary | ICD-10-CM | POA: Diagnosis not present

## 2021-02-15 DIAGNOSIS — Z23 Encounter for immunization: Secondary | ICD-10-CM | POA: Diagnosis not present

## 2021-02-15 DIAGNOSIS — F3162 Bipolar disorder, current episode mixed, moderate: Secondary | ICD-10-CM | POA: Diagnosis not present

## 2021-02-15 DIAGNOSIS — N400 Enlarged prostate without lower urinary tract symptoms: Secondary | ICD-10-CM | POA: Diagnosis not present

## 2021-02-15 DIAGNOSIS — G894 Chronic pain syndrome: Secondary | ICD-10-CM | POA: Diagnosis not present

## 2021-02-25 DIAGNOSIS — M47816 Spondylosis without myelopathy or radiculopathy, lumbar region: Secondary | ICD-10-CM | POA: Insufficient documentation

## 2021-02-25 DIAGNOSIS — I10 Essential (primary) hypertension: Secondary | ICD-10-CM | POA: Diagnosis not present

## 2021-02-25 DIAGNOSIS — Z6831 Body mass index (BMI) 31.0-31.9, adult: Secondary | ICD-10-CM | POA: Diagnosis not present

## 2021-02-25 DIAGNOSIS — M48062 Spinal stenosis, lumbar region with neurogenic claudication: Secondary | ICD-10-CM | POA: Diagnosis not present

## 2021-02-26 DIAGNOSIS — M47816 Spondylosis without myelopathy or radiculopathy, lumbar region: Secondary | ICD-10-CM | POA: Diagnosis not present

## 2021-02-26 DIAGNOSIS — Z6831 Body mass index (BMI) 31.0-31.9, adult: Secondary | ICD-10-CM | POA: Diagnosis not present

## 2021-02-26 DIAGNOSIS — I10 Essential (primary) hypertension: Secondary | ICD-10-CM | POA: Diagnosis not present

## 2021-03-13 DIAGNOSIS — H43813 Vitreous degeneration, bilateral: Secondary | ICD-10-CM | POA: Diagnosis not present

## 2021-03-13 DIAGNOSIS — Z961 Presence of intraocular lens: Secondary | ICD-10-CM | POA: Diagnosis not present

## 2021-03-13 DIAGNOSIS — H2511 Age-related nuclear cataract, right eye: Secondary | ICD-10-CM | POA: Diagnosis not present

## 2021-03-13 DIAGNOSIS — H40031 Anatomical narrow angle, right eye: Secondary | ICD-10-CM | POA: Diagnosis not present

## 2021-03-18 DIAGNOSIS — L570 Actinic keratosis: Secondary | ICD-10-CM | POA: Diagnosis not present

## 2021-03-18 DIAGNOSIS — Z85828 Personal history of other malignant neoplasm of skin: Secondary | ICD-10-CM | POA: Diagnosis not present

## 2021-03-18 DIAGNOSIS — L578 Other skin changes due to chronic exposure to nonionizing radiation: Secondary | ICD-10-CM | POA: Diagnosis not present

## 2021-03-18 DIAGNOSIS — L821 Other seborrheic keratosis: Secondary | ICD-10-CM | POA: Diagnosis not present

## 2021-03-20 DIAGNOSIS — M47816 Spondylosis without myelopathy or radiculopathy, lumbar region: Secondary | ICD-10-CM | POA: Diagnosis not present

## 2021-03-20 DIAGNOSIS — I10 Essential (primary) hypertension: Secondary | ICD-10-CM | POA: Diagnosis not present

## 2021-03-20 DIAGNOSIS — Z6831 Body mass index (BMI) 31.0-31.9, adult: Secondary | ICD-10-CM | POA: Diagnosis not present

## 2021-04-01 DIAGNOSIS — H903 Sensorineural hearing loss, bilateral: Secondary | ICD-10-CM | POA: Diagnosis not present

## 2021-04-12 DIAGNOSIS — M47816 Spondylosis without myelopathy or radiculopathy, lumbar region: Secondary | ICD-10-CM | POA: Diagnosis not present

## 2021-05-08 ENCOUNTER — Telehealth (HOSPITAL_COMMUNITY): Payer: Self-pay | Admitting: *Deleted

## 2021-05-08 ENCOUNTER — Other Ambulatory Visit (HOSPITAL_COMMUNITY): Payer: Self-pay | Admitting: Psychiatry

## 2021-05-08 MED ORDER — LISDEXAMFETAMINE DIMESYLATE 70 MG PO CAPS: 70.0000 mg | ORAL_CAPSULE | Freq: Every day | ORAL | 0 refills | Status: DC

## 2021-05-08 NOTE — Telephone Encounter (Signed)
sent 

## 2021-05-08 NOTE — Telephone Encounter (Signed)
Patient called stating he is needing refills for his Vyvanse. Pt scheduled f/u appt with provider for 05-20-21.

## 2021-05-17 DIAGNOSIS — B370 Candidal stomatitis: Secondary | ICD-10-CM | POA: Diagnosis not present

## 2021-05-17 DIAGNOSIS — E291 Testicular hypofunction: Secondary | ICD-10-CM | POA: Diagnosis not present

## 2021-05-17 DIAGNOSIS — K219 Gastro-esophageal reflux disease without esophagitis: Secondary | ICD-10-CM | POA: Diagnosis not present

## 2021-05-20 ENCOUNTER — Encounter (HOSPITAL_COMMUNITY): Payer: Self-pay | Admitting: Psychiatry

## 2021-05-20 ENCOUNTER — Other Ambulatory Visit: Payer: Self-pay

## 2021-05-20 ENCOUNTER — Telehealth (INDEPENDENT_AMBULATORY_CARE_PROVIDER_SITE_OTHER): Payer: PPO | Admitting: Psychiatry

## 2021-05-20 DIAGNOSIS — F3162 Bipolar disorder, current episode mixed, moderate: Secondary | ICD-10-CM

## 2021-05-20 DIAGNOSIS — F900 Attention-deficit hyperactivity disorder, predominantly inattentive type: Secondary | ICD-10-CM

## 2021-05-20 MED ORDER — LAMOTRIGINE 200 MG PO TABS: 200.0000 mg | ORAL_TABLET | Freq: Every day | ORAL | 2 refills | Status: DC

## 2021-05-20 MED ORDER — LISDEXAMFETAMINE DIMESYLATE 70 MG PO CAPS: 70.0000 mg | ORAL_CAPSULE | Freq: Every day | ORAL | 0 refills | Status: DC

## 2021-05-20 NOTE — Progress Notes (Signed)
Virtual Visit via Telephone Note  I connected with Philip Gates on 05/20/21 at 10:40 AM EDT by telephone and verified that I am speaking with the correct person using two identifiers.  Location: Patient: home Provider: home office   I discussed the limitations, risks, security and privacy concerns of performing an evaluation and management service by telephone and the availability of in person appointments. I also discussed with the patient that there may be a patient responsible charge related to this service. The patient expressed understanding and agreed to proceed.     I discussed the assessment and treatment plan with the patient. The patient was provided an opportunity to ask questions and all were answered. The patient agreed with the plan and demonstrated an understanding of the instructions.   The patient was advised to call back or seek an in-person evaluation if the symptoms worsen or if the condition fails to improve as anticipated.  I provided 15 minutes of non-face-to-face time during this encounter.   [name], MD  [facility] MD/PA/NP OP Progress Note  05/20/2021 10:54 AM Philip Gates  MRN:  [id number]  Chief Complaint:  Chief Complaint   Manic Behavior; ADHD; Follow-up    HPI: This patient is a 65 year old married white male who lives with his wife in [place].  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  The patient returns for follow-up regarding his mood swings and ADHD.  He states for the most part he is doing very well.  He had a nice trip to the beach with his family and recently celebrated his cousin's birthday.  He lost another cousin to cancer back in April.  He states that this is open his eyes and is trying to live each day to the fullest.  He is still struggling with chronic back pain and is getting injections.  He does state that his mood has been quite good and he is not depressed at all and  enjoying things that he is doing.  He is sleeping well for the most part.  He denies significant anxiety.  His focus continues to be good with the Vyvanse Visit Diagnosis:    ICD-10-CM   1. Attention deficit hyperactivity disorder (ADHD), predominantly inattentive type  F90.0     2. 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   Venous insufficiency     Past Surgical History:  Procedure Laterality Date   cerebral stent     COLONOSCOPY N/A 01/02/2017  Procedure: COLONOSCOPY;  Surgeon: Rogene Houston, MD;  [facility];  Service: Endoscopy;  Laterality: N/A;   dental implant  09/26/2014   upper and lower dental implant/bridges- permanent   ESOPHAGOGASTRODUODENOSCOPY N/A 01/02/2017   Procedure: ESOPHAGOGASTRODUODENOSCOPY (EGD);  Surgeon: Rogene Houston, MD;  [facility];  Service: Endoscopy;  Laterality: N/A;  910   EYE SURGERY Left Jan. 16, 2017   Cataract   IR ANGIO INTRA EXTRACRAN SEL INTERNAL CAROTID BILAT MOD SED  01/20/2017   IR ANGIO VERTEBRAL SEL VERTEBRAL UNI L MOD SED  01/20/2017   JOINT REPLACEMENT Right    Knee   JOINT REPLACEMENT Left    Hip   NOSE SURGERY     RADIOLOGY WITH  ANESTHESIA N/A 12/29/2014   Procedure: Embolization;  Surgeon: Consuella Lose, MD;  Location: [place];  Service: Radiology;  Laterality: N/A;   SUBCLAVIAN ANGIOGRAM  Nov. 8, 2016   TOTAL HIP ARTHROPLASTY Left 01/03/2013   Procedure: TOTAL HIP ARTHROPLASTY ANTERIOR APPROACH;  Surgeon: Gearlean Alf, MD;  [facility];  Service: Orthopedics;  Laterality: Left;   TOTAL KNEE ARTHROPLASTY Right 10/06/2014   Procedure: RIGHT TOTAL KNEE ARTHROPLASTY;  Surgeon: Gearlean Alf, MD;  [facility];  Service: Orthopedics;  Laterality: Right;   WISDOM TOOTH EXTRACTION      Family Psychiatric History: See below  Family History:  Family History  Problem Relation Age of Onset   Arrhythmia Mother        Has pacemaker   Hypertension Mother    Heart disease Mother    Mental illness Mother    Varicose Veins Mother    Depression Mother    Colon cancer Father    Cancer Father        Prostate and Colon   Diabetes Father    Hypertension Father    Cancer - Colon Father    Depression Sister     Social History:  Social History   Socioeconomic History   Marital status: Married    Spouse name: Not on file   Number of children: Not on file   Years of education: Not on file   Highest education level: Not on file  Occupational History   Not on file  Tobacco Use   Smoking status: Former    Packs/day: 0.50    Types: Cigarettes    Start date: 05/23/2015    Quit date: 10/09/2017    Years since quitting: 3.6   Smokeless tobacco: Former    Types: Snuff   Tobacco comments:    Stated "I smoke off and on"  Vaping Use   Vaping Use: Never used  Substance and Sexual Activity   Alcohol use: No    Alcohol/week: 0.0 standard drinks   Drug use: No   Sexual activity: Yes    Partners: Female  Other Topics Concern   Not on file  Social History Narrative   Not on file   Social Determinants of Health   Financial Resource Strain: Not on file  Food Insecurity: Not on file  Transportation Needs:  Not on file  Physical Activity: Not on file  Stress: Not on file  Social Connections: Not on file    Allergies:  Allergies  Allergen Reactions   Cleocin [Clindamycin] Other (See Comments)    Abdominal pain "twisted stomach"   Erythromycin     Caused a "twisted stomach"   Iodine    Penicillins Rash    Metabolic Disorder Labs: No results  found for: HGBA1C, MPG No results found for: PROLACTIN No results found for: CHOL, TRIG, HDL, CHOLHDL, VLDL, LDLCALC No results found for: TSH  Therapeutic Level Labs: No results found for: LITHIUM No results found for: VALPROATE No components found for:  CBMZ  Current Medications: Current Outpatient Medications  Medication Sig Dispense Refill   Ascorbic Acid (VITAMIN C) 1000 MG tablet Take 1,000 mg by mouth in the morning and at bedtime.     aspirin EC 81 MG tablet Take 81 mg by mouth in the morning. Swallow whole.     BLACK ELDERBERRY PO Take 1 capsule by mouth in the morning and at bedtime.     BREZTRI AEROSPHERE 160-9-4.8 MCG/ACT AERO Inhale 1 puff into the lungs in the morning and at bedtime.     cyclobenzaprine (FLEXERIL) 5 MG tablet Take 1 tablet (5 mg total) by mouth 3 (three) times daily as needed for muscle spasms. 30 tablet 0   dexlansoprazole (DEXILANT) 60 MG capsule Take 60 mg by mouth every morning.      Dutasteride-Tamsulosin HCl 0.5-0.4 MG CAPS Take 1 capsule by mouth in the morning.     famotidine (PEPCID) 20 MG tablet Take 20 mg by mouth in the morning and at bedtime.     Homeopathic Products (MUSCLE CRAMP COMPLEX PO) Take 1 capsule by mouth in the morning and at bedtime. Cramp Defense Magnesium for Leg Cramps, Muscle Cramps & Muscle Spasms.     HYDROmorphone (DILAUDID) 4 MG tablet Take 1 tablet (4 mg total) by mouth every 6 (six) hours as needed for severe pain. 12 tablet 0   IBU 800 MG tablet Take 800 mg by mouth every 8 (eight) hours as needed (pain).     lamoTRIgine (LAMICTAL) 200 MG tablet Take 1 tablet (200 mg total) by  mouth at bedtime. 30 tablet 2   levocetirizine (XYZAL) 5 MG tablet Take 5 mg by mouth at bedtime.     lisdexamfetamine (VYVANSE) 70 MG capsule Take 1 capsule (70 mg total) by mouth daily. 30 capsule 0   lisdexamfetamine (VYVANSE) 70 MG capsule Take 1 capsule (70 mg total) by mouth daily. 30 capsule 0   lisdexamfetamine (VYVANSE) 70 MG capsule Take 1 capsule (70 mg total) by mouth daily. 30 capsule 0   metoprolol succinate (TOPROL-XL) 50 MG 24 hr tablet Take 50 mg by mouth at bedtime.     Misc Natural Product Nasal (NASAL CLEANSE RINSE MIX) PACK Place 1 Dose into the nose daily. W/Azithromycin     Misc Natural Products (PROSTATE HEALTH) CAPS Take 1 capsule by mouth in the morning and at bedtime.     mometasone (ELOCON) 0.1 % cream Apply 1 application topically daily as needed (skin irritation).     montelukast (SINGULAIR) 10 MG tablet Take 10 mg by mouth every morning.     Multiple Vitamins-Minerals (MULTIVITAMIN WITH MINERALS) tablet Take 1 tablet by mouth daily. Multivitamin for Women (needs the iron)     OVER THE COUNTER MEDICATION Take 1 capsule by mouth in the morning. Ketones BHB     PROAIR HFA 108 (90 Base) MCG/ACT inhaler Inhale 1-2 puffs into the lungs every 6 (six) hours as needed for shortness of breath or wheezing.     Probiotic Product (DIGESTIVE ADV MULTI-STRAIN PO) Take 1 capsule by mouth in the morning and at bedtime.     promethazine (PHENERGAN) 25 MG tablet Take 25 mg by mouth every 6 (six) hours as needed for nausea or vomiting.  tadalafil (CIALIS) 20 MG tablet Take 20 mg by mouth at bedtime as needed for erectile dysfunction.     telmisartan (MICARDIS) 40 MG tablet Take 40 mg by mouth in the morning.     testosterone cypionate (DEPOTESTOTERONE CYPIONATE) 200 MG/ML injection Inject 200 mg into the muscle every 14 (fourteen) days.      No current facility-administered medications for this visit.     Musculoskeletal: Strength & Muscle Tone: na Gait & Station: na Patient  leans: N/A  Psychiatric Specialty Exam: Review of Systems  Musculoskeletal:  Positive for back pain.  All other systems reviewed and are negative.  There were no vitals taken for this visit.There is no height or weight on file to calculate BMI.  General Appearance: NA  Eye Contact:  NA  Speech:  Clear and Coherent  Volume:  Normal  Mood:  Euthymic  Affect:  NA  Thought Process:  Goal Directed  Orientation:  Full (Time, Place, and Person)  Thought Content: WDL   Suicidal Thoughts:  No  Homicidal Thoughts:  No  Memory:  Immediate;   Good Recent;   Good Remote;   Good  Judgement:  Good  Insight:  Fair  Psychomotor Activity:  Decreased  Concentration:  Concentration: Good and Attention Span: Good  Recall:  Good  Fund of Knowledge: Good  Language: Good  Akathisia:  No  Handed:  Right  AIMS (if indicated): not done  Assets:  Communication Skills Desire for Improvement Resilience Social Support Talents/Skills  ADL's:  Intact  Cognition: WNL  Sleep:  Good   Screenings: PHQ2-9    Flowsheet Row Video Visit from 05/20/2021 in [facility]  PHQ-2 Total Score 0      Flowsheet Row ED from 12/12/2020 in [facility] from 12/10/2020 in [facility] (Discharged) from 12/07/2020 in [facility] Error: Question 6 not populated No Risk No Risk        Assessment and Plan: This patient is a 65 year old male with a history of bipolar disorder and ADHD.  He continues to do well and his mood is stable.  He continues to focus well.  He will continue Lamictal 200 mg daily at bedtime for mood stabilization and Vyvanse 70 mg every morning for ADHD.  He will return to see me in 3 months   [name], MD 05/20/2021, 10:54 AM

## 2021-05-29 DIAGNOSIS — M47816 Spondylosis without myelopathy or radiculopathy, lumbar region: Secondary | ICD-10-CM | POA: Diagnosis not present

## 2021-05-29 DIAGNOSIS — Z6830 Body mass index (BMI) 30.0-30.9, adult: Secondary | ICD-10-CM | POA: Insufficient documentation

## 2021-06-19 DIAGNOSIS — M5412 Radiculopathy, cervical region: Secondary | ICD-10-CM | POA: Diagnosis not present

## 2021-06-26 DIAGNOSIS — R232 Flushing: Secondary | ICD-10-CM | POA: Diagnosis not present

## 2021-06-26 DIAGNOSIS — Z23 Encounter for immunization: Secondary | ICD-10-CM | POA: Diagnosis not present

## 2021-06-26 DIAGNOSIS — R61 Generalized hyperhidrosis: Secondary | ICD-10-CM | POA: Diagnosis not present

## 2021-07-17 DIAGNOSIS — M461 Sacroiliitis, not elsewhere classified: Secondary | ICD-10-CM | POA: Diagnosis not present

## 2021-07-20 ENCOUNTER — Encounter: Payer: Self-pay | Admitting: Emergency Medicine

## 2021-07-20 ENCOUNTER — Ambulatory Visit
Admission: EM | Admit: 2021-07-20 | Discharge: 2021-07-20 | Disposition: A | Discharge status: Home / Self Care | Payer: PPO | Attending: Urgent Care | Admitting: Urgent Care

## 2021-07-20 ENCOUNTER — Other Ambulatory Visit: Payer: Self-pay

## 2021-07-20 ENCOUNTER — Ambulatory Visit (INDEPENDENT_AMBULATORY_CARE_PROVIDER_SITE_OTHER): Payer: PPO

## 2021-07-20 DIAGNOSIS — J189 Pneumonia, unspecified organism: Secondary | ICD-10-CM

## 2021-07-20 DIAGNOSIS — J449 Chronic obstructive pulmonary disease, unspecified: Secondary | ICD-10-CM | POA: Diagnosis not present

## 2021-07-20 DIAGNOSIS — R0602 Shortness of breath: Secondary | ICD-10-CM | POA: Diagnosis not present

## 2021-07-20 DIAGNOSIS — R051 Acute cough: Secondary | ICD-10-CM

## 2021-07-20 DIAGNOSIS — R0781 Pleurodynia: Secondary | ICD-10-CM

## 2021-07-20 DIAGNOSIS — R0989 Other specified symptoms and signs involving the circulatory and respiratory systems: Secondary | ICD-10-CM

## 2021-07-20 LAB — POC INFLUENZA A AND B ANTIGEN (URGENT CARE ONLY)
Influenza A Ag: NEGATIVE
Influenza B Ag: NEGATIVE

## 2021-07-20 IMAGING — DX DG CHEST 2V
2 series · 2 of 2 positions shown · non-contrast
Comparison: Radiograph [DATE]

CLINICAL DATA: Shortness of breath

EXAM:
CHEST - 2 VIEW

[chest pa]
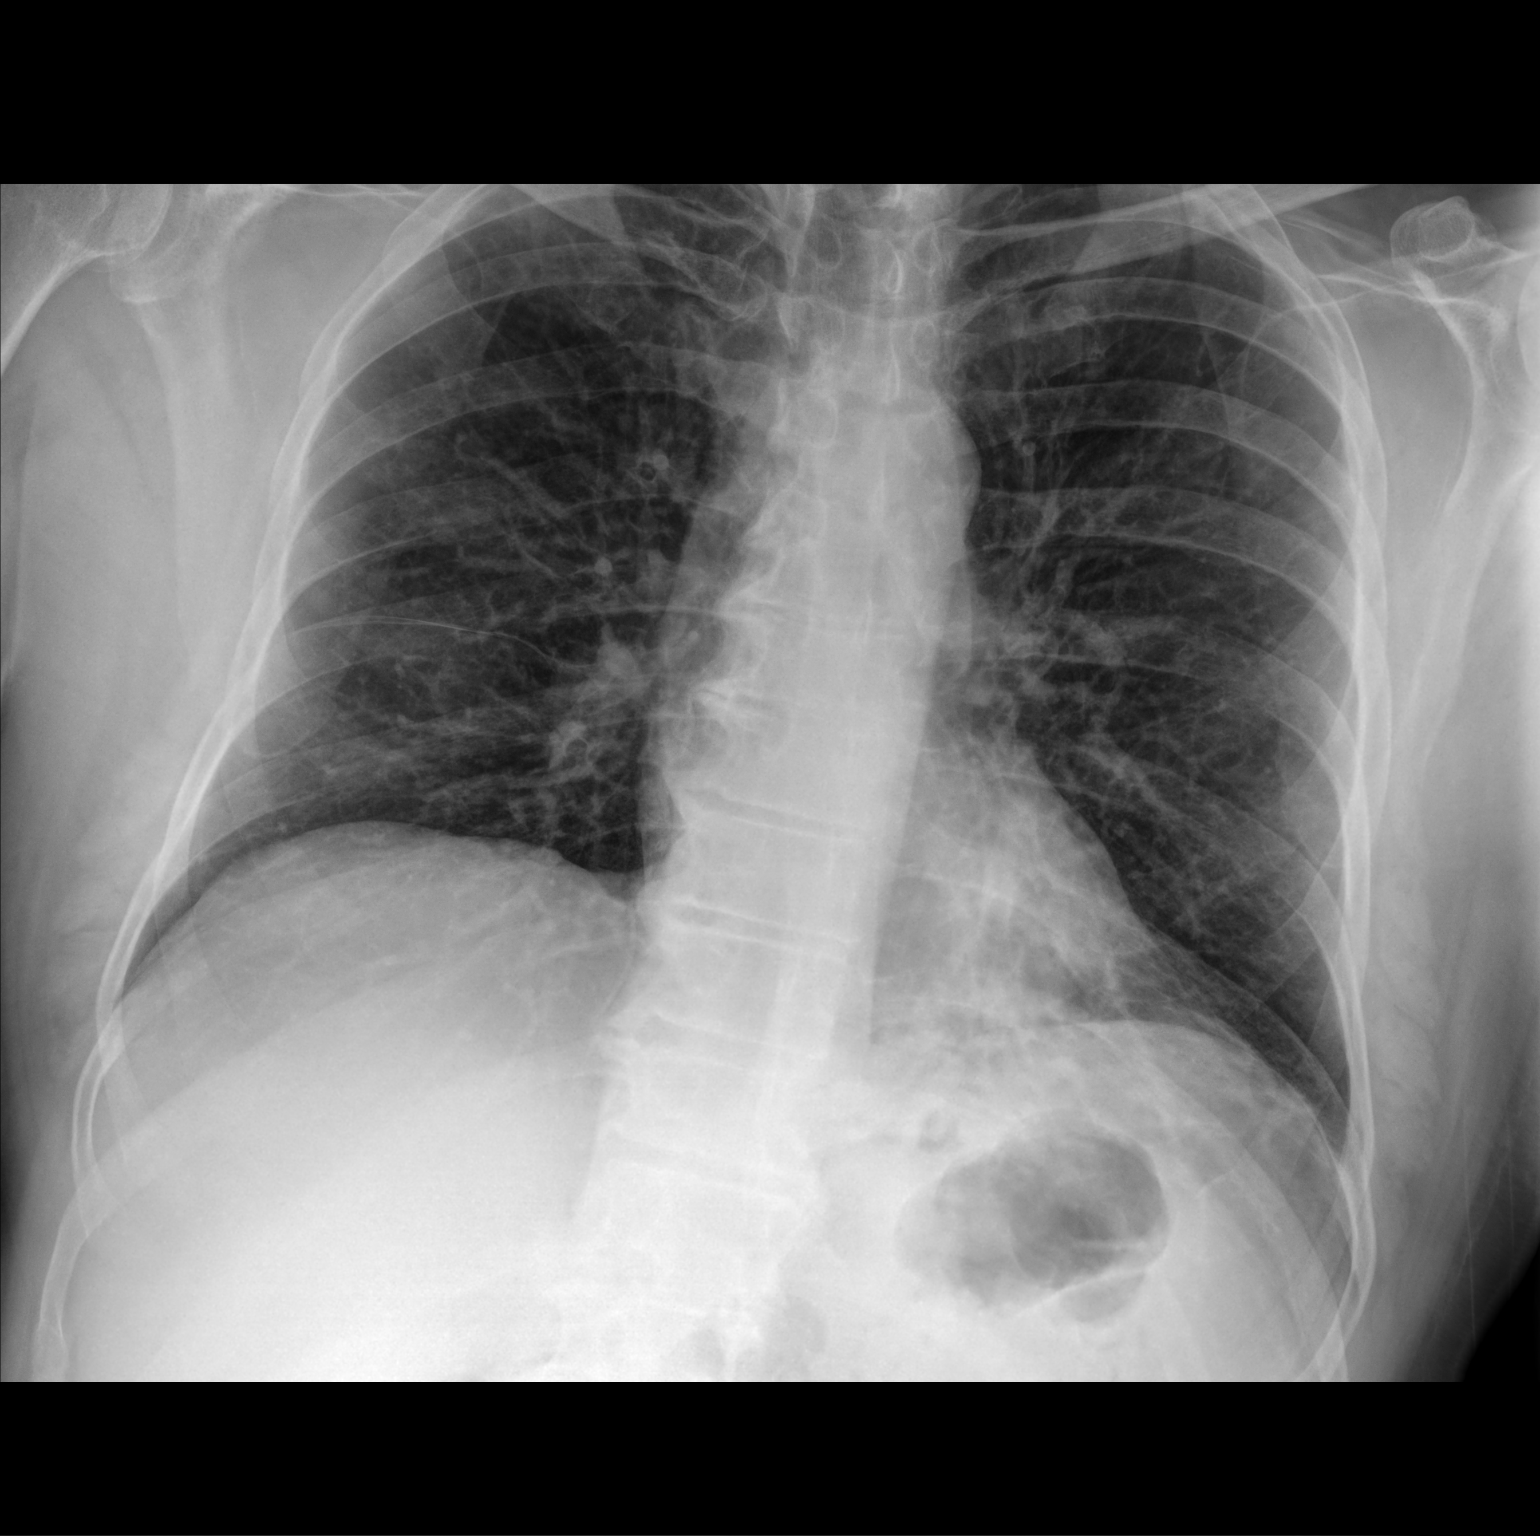

[chest lat]
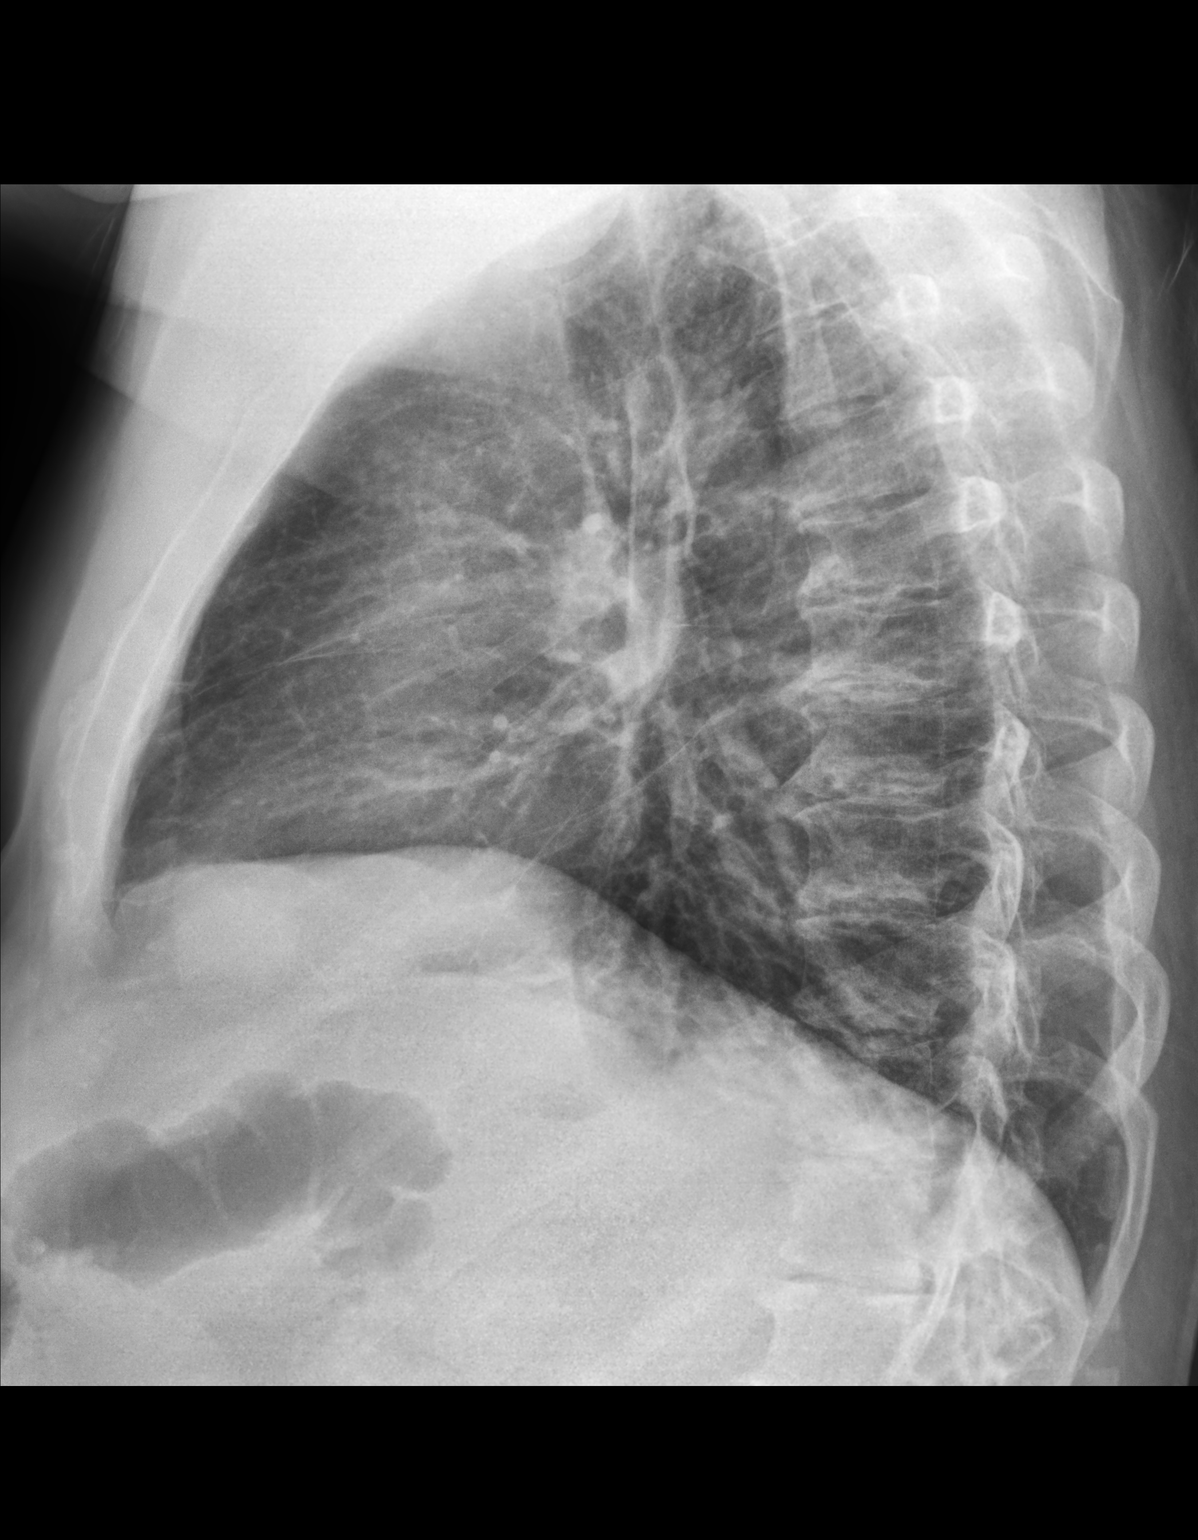

[2 of 2 positions shown; findings below may reference images not displayed]

FINDINGS: Unchanged cardiomediastinal silhouette. There are left basilar
opacities. There is no large pleural effusion or visible
pneumothorax. There is no acute osseous abnormality. Thoracic
spondylosis.
IMPRESSION: Left basilar opacities concerning for pneumonia.

## 2021-07-20 MED ORDER — LEVOFLOXACIN 750 MG PO TABS: 750.0000 mg | ORAL_TABLET | Freq: Every day | ORAL | 0 refills | Status: DC

## 2021-07-20 MED ORDER — PREDNISONE 20 MG PO TABS: ORAL_TABLET | ORAL | 0 refills | Status: DC

## 2021-07-20 NOTE — ED Provider Notes (Signed)
[name]   MRN: [id number] DOB: 18-Apr-1956  Subjective:   Philip Gates is a 65 y.o. male presenting for 5-day history of acute onset coughing, chest congestion, chest pain with taking a deep breath, fevers, coughing.  Patient has a history of COPD.  Has had difficulty with allergies to antibiotics like clarithromycin, clindamycin.  Reports that he had swelling with this.  He is also allergic to penicillins.  He does use his inhalers regularly for his COPD as he is supposed to.  Had 2 sick contacts earlier this week.  No current facility-administered medications for this encounter.  Current Outpatient Medications:    Ascorbic Acid (VITAMIN C) 1000 MG tablet, Take 1,000 mg by mouth in the morning and at bedtime., Disp: , Rfl:    aspirin EC 81 MG tablet, Take 81 mg by mouth in the morning. Swallow whole., Disp: , Rfl:    BLACK ELDERBERRY PO, Take 1 capsule by mouth in the morning and at bedtime., Disp: , Rfl:    BREZTRI AEROSPHERE 160-9-4.8 MCG/ACT AERO, Inhale 1 puff into the lungs in the morning and at bedtime., Disp: , Rfl:    cyclobenzaprine (FLEXERIL) 5 MG tablet, Take 1 tablet (5 mg total) by mouth 3 (three) times daily as needed for muscle spasms., Disp: 30 tablet, Rfl: 0   dexlansoprazole (DEXILANT) 60 MG capsule, Take 60 mg by mouth every morning. , Disp: , Rfl:    Dutasteride-Tamsulosin HCl 0.5-0.4 MG CAPS, Take 1 capsule by mouth in the morning., Disp: , Rfl:    famotidine (PEPCID) 20 MG tablet, Take 20 mg by mouth in the morning and at bedtime., Disp: , Rfl:    Homeopathic Products (MUSCLE CRAMP COMPLEX PO), Take 1 capsule by mouth in the morning and at bedtime. Cramp Defense Magnesium for Leg Cramps, Muscle Cramps & Muscle Spasms., Disp: , Rfl:    HYDROmorphone (DILAUDID) 4 MG tablet, Take 1 tablet (4 mg total) by mouth every 6 (six) hours as needed for severe pain., Disp: 12 tablet, Rfl: 0   IBU 800 MG tablet, Take 800 mg by mouth every 8 (eight) hours as  needed (pain)., Disp: , Rfl:    lamoTRIgine (LAMICTAL) 200 MG tablet, Take 1 tablet (200 mg total) by mouth at bedtime., Disp: 30 tablet, Rfl: 2   levocetirizine (XYZAL) 5 MG tablet, Take 5 mg by mouth at bedtime., Disp: , Rfl:    lisdexamfetamine (VYVANSE) 70 MG capsule, Take 1 capsule (70 mg total) by mouth daily., Disp: 30 capsule, Rfl: 0   lisdexamfetamine (VYVANSE) 70 MG capsule, Take 1 capsule (70 mg total) by mouth daily., Disp: 30 capsule, Rfl: 0   lisdexamfetamine (VYVANSE) 70 MG capsule, Take 1 capsule (70 mg total) by mouth daily., Disp: 30 capsule, Rfl: 0   metoprolol succinate (TOPROL-XL) 50 MG 24 hr tablet, Take 50 mg by mouth at bedtime., Disp: , Rfl:    Misc Natural Product Nasal (NASAL CLEANSE RINSE MIX) PACK, Place 1 Dose into the nose daily. W/Azithromycin, Disp: , Rfl:    Misc Natural Products (PROSTATE HEALTH) CAPS, Take 1 capsule by mouth in the morning and at bedtime., Disp: , Rfl:    mometasone (ELOCON) 0.1 % cream, Apply 1 application topically daily as needed (skin irritation)., Disp: , Rfl:    montelukast (SINGULAIR) 10 MG tablet, Take 10 mg by mouth every morning., Disp: , Rfl:    Multiple Vitamins-Minerals (MULTIVITAMIN WITH MINERALS) tablet, Take 1 tablet by mouth daily. Multivitamin for Women (needs the iron),  Disp: , Rfl:    OVER THE COUNTER MEDICATION, Take 1 capsule by mouth in the morning. Ketones BHB, Disp: , Rfl:    PROAIR HFA 108 (90 Base) MCG/ACT inhaler, Inhale 1-2 puffs into the lungs every 6 (six) hours as needed for shortness of breath or wheezing., Disp: , Rfl:    Probiotic Product (DIGESTIVE ADV MULTI-STRAIN PO), Take 1 capsule by mouth in the morning and at bedtime., Disp: , Rfl:    promethazine (PHENERGAN) 25 MG tablet, Take 25 mg by mouth every 6 (six) hours as needed for nausea or vomiting., Disp: , Rfl:    tadalafil (CIALIS) 20 MG tablet, Take 20 mg by mouth at bedtime as needed for erectile dysfunction., Disp: , Rfl:    telmisartan (MICARDIS) 40 MG  tablet, Take 40 mg by mouth in the morning., Disp: , Rfl:    testosterone cypionate (DEPOTESTOTERONE CYPIONATE) 200 MG/ML injection, Inject 200 mg into the muscle every 14 (fourteen) days. 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   Venous insufficiency      Past Surgical History:  Procedure Laterality Date   cerebral stent     COLONOSCOPY N/A 01/02/2017   Procedure: COLONOSCOPY;  Surgeon: Rogene Houston, MD;  [facility];  Service: Endoscopy;  Laterality: N/A;   dental implant  09/26/2014   upper and lower dental implant/bridges- permanent   ESOPHAGOGASTRODUODENOSCOPY N/A 01/02/2017   Procedure: ESOPHAGOGASTRODUODENOSCOPY (EGD);  Surgeon: Rogene Houston, MD;  [facility];  Service: Endoscopy;  Laterality: N/A;  910   EYE SURGERY Left Jan. 16, 2017   Cataract   IR ANGIO INTRA EXTRACRAN SEL INTERNAL CAROTID BILAT MOD SED  01/20/2017   IR ANGIO VERTEBRAL SEL VERTEBRAL UNI L MOD SED  01/20/2017   JOINT REPLACEMENT Right    Knee   JOINT REPLACEMENT Left    Hip   NOSE SURGERY     RADIOLOGY WITH ANESTHESIA N/A 12/29/2014   Procedure:  Embolization;  Surgeon: Consuella Lose, MD;  Location: [facility];  Service: Radiology;  Laterality: N/A;   SUBCLAVIAN ANGIOGRAM  Nov. 8, 2016   TOTAL HIP ARTHROPLASTY Left 01/03/2013   Procedure: TOTAL HIP ARTHROPLASTY ANTERIOR APPROACH;  Surgeon: Gearlean Alf, MD;  [facility];  Service: Orthopedics;  Laterality: Left;   TOTAL KNEE ARTHROPLASTY Right 10/06/2014   Procedure: RIGHT TOTAL KNEE ARTHROPLASTY;  Surgeon: Gearlean Alf, MD;  Location: [facility];  Service: Orthopedics;  Laterality: Right;   WISDOM TOOTH EXTRACTION      Family History  Problem Relation Age of Onset   Arrhythmia Mother        Has pacemaker   Hypertension Mother    Heart disease Mother    Mental illness Mother    Varicose Veins Mother    Depression Mother    Colon cancer Father    Cancer Father        Prostate and Colon   Diabetes Father    Hypertension Father    Cancer - Colon Father    Depression Sister     Social History   Tobacco Use   Smoking status: Former    Packs/day: 0.50    Types: Cigarettes    Start date: 05/23/2015    Quit date: 10/09/2017    Years since quitting: 3.7   Smokeless tobacco: Former    Types: Snuff   Tobacco comments:    Stated "I smoke off and on"  Vaping Use   Vaping Use: Never used  Substance Use Topics   Alcohol use: No    Alcohol/week: 0.0 standard drinks   Drug use: No    ROS   Objective:   Vitals: BP 103/70 (BP Location: Right Arm)   Pulse (!) 125   Temp 97.9 F (36.6 C) (Oral)   Resp (!) 22   SpO2 94%   Physical Exam Constitutional:      General: He is not in acute distress.    Appearance: Normal appearance. He is well-developed. He is not ill-appearing, toxic-appearing or diaphoretic.  HENT:     Head: Normocephalic and atraumatic.     Right Ear: External ear normal.     Left Ear: External ear normal.     Nose: Nose normal.     Mouth/Throat:     Mouth: Mucous membranes are moist.     Pharynx: Oropharynx is clear.  Eyes:     General: No  scleral icterus.    Extraocular Movements: Extraocular movements intact.     Pupils: Pupils are equal, round, and reactive to light.  Cardiovascular:     Rate and Rhythm: Normal rate and regular rhythm.     Heart sounds: Normal heart sounds. No murmur heard.   No friction rub. No gallop.  Pulmonary:     Effort: Pulmonary effort is normal. No respiratory distress.     Breath sounds: No stridor. Rales (left lower lobe) present. No wheezing or rhonchi.  Neurological:     Mental Status: He is alert and oriented to person, place, and time.  Psychiatric:        Mood and Affect: Mood normal.        Behavior: Behavior normal.        Thought Content: Thought content normal.    Results for orders placed or performed during the hospital encounter of 07/20/21 (from the past 24 hour(s))  POC Influenza A & B Ag (Urgent Care)     Status: None   Collection Time: 07/20/21 10:18 AM  Result Value Ref Range   Influenza A Ag Negative Negative   Influenza B Ag Negative Negative    DG Chest 2 View  Result Date: 07/20/2021 CLINICAL DATA:  Shortness of breath EXAM: CHEST - 2 VIEW COMPARISON:  Radiograph 08/08/2020 FINDINGS: Unchanged cardiomediastinal silhouette. There are left basilar opacities. There is no large pleural effusion or visible pneumothorax. There is no acute osseous abnormality. Thoracic spondylosis. IMPRESSION: Left basilar opacities concerning  for pneumonia. Electronically Signed   By: Maurine Simmering M.D.   On: 07/20/2021 10:24     Assessment and Plan :   PDMP not reviewed this encounter.  1. Pneumonia of left lower lobe due to infectious organism   2. Acute cough   3. Chest congestion   4. Pleuritic pain   5. Chronic obstructive pulmonary disease, unspecified COPD type ([place])    Given his medication allergies, will use levofloxacin to cover for community-acquired pneumonia.  Discussed this with patient at length, recommended supportive care otherwise.  Maintain medications for his  COPD.  Patient requested a steroid course and I obliged him given his history of COPD and reports that he has a very difficult time when he gets sick with his COPD.  He requested azithromycin but given his medication allergies we will have to avoid this.  Follow-up with PCP, repeat chest x-ray in 3 to 4 weeks. Counseled patient on potential for adverse effects with medications prescribed/recommended today, ER and return-to-clinic precautions discussed, patient verbalized understanding.    Jaynee Eagles, PA-C 07/20/21 1037

## 2021-07-20 NOTE — ED Triage Notes (Signed)
Pt presents today with c/o of fever, cough and chest congestion x 5 days. He took Mucinex DM and Ibuprofen 800 mg at approx 8a.

## 2021-07-24 ENCOUNTER — Other Ambulatory Visit (HOSPITAL_COMMUNITY): Payer: Self-pay | Admitting: Adult Health Nurse Practitioner

## 2021-07-24 DIAGNOSIS — J189 Pneumonia, unspecified organism: Secondary | ICD-10-CM

## 2021-07-26 ENCOUNTER — Other Ambulatory Visit: Payer: Self-pay

## 2021-07-26 ENCOUNTER — Ambulatory Visit (HOSPITAL_COMMUNITY)
Admission: RE | Admit: 2021-07-26 | Discharge: 2021-07-26 | Disposition: A | Discharge status: Home / Self Care | Payer: PPO | Source: Ambulatory Visit | Attending: Adult Health Nurse Practitioner | Admitting: Adult Health Nurse Practitioner

## 2021-07-26 DIAGNOSIS — R509 Fever, unspecified: Secondary | ICD-10-CM | POA: Diagnosis not present

## 2021-07-26 DIAGNOSIS — J189 Pneumonia, unspecified organism: Secondary | ICD-10-CM | POA: Diagnosis not present

## 2021-07-26 IMAGING — DX DG CHEST 2V
2 series · 2 of 2 positions shown · non-contrast
Comparison: Chest x-ray [DATE].

CLINICAL DATA: 65-year-old male with history of left lower lobe
pneumonia. Five day history of cough and chest congestion with chest
pain. Fevers.

EXAM:
CHEST - 2 VIEW

[chest pa]
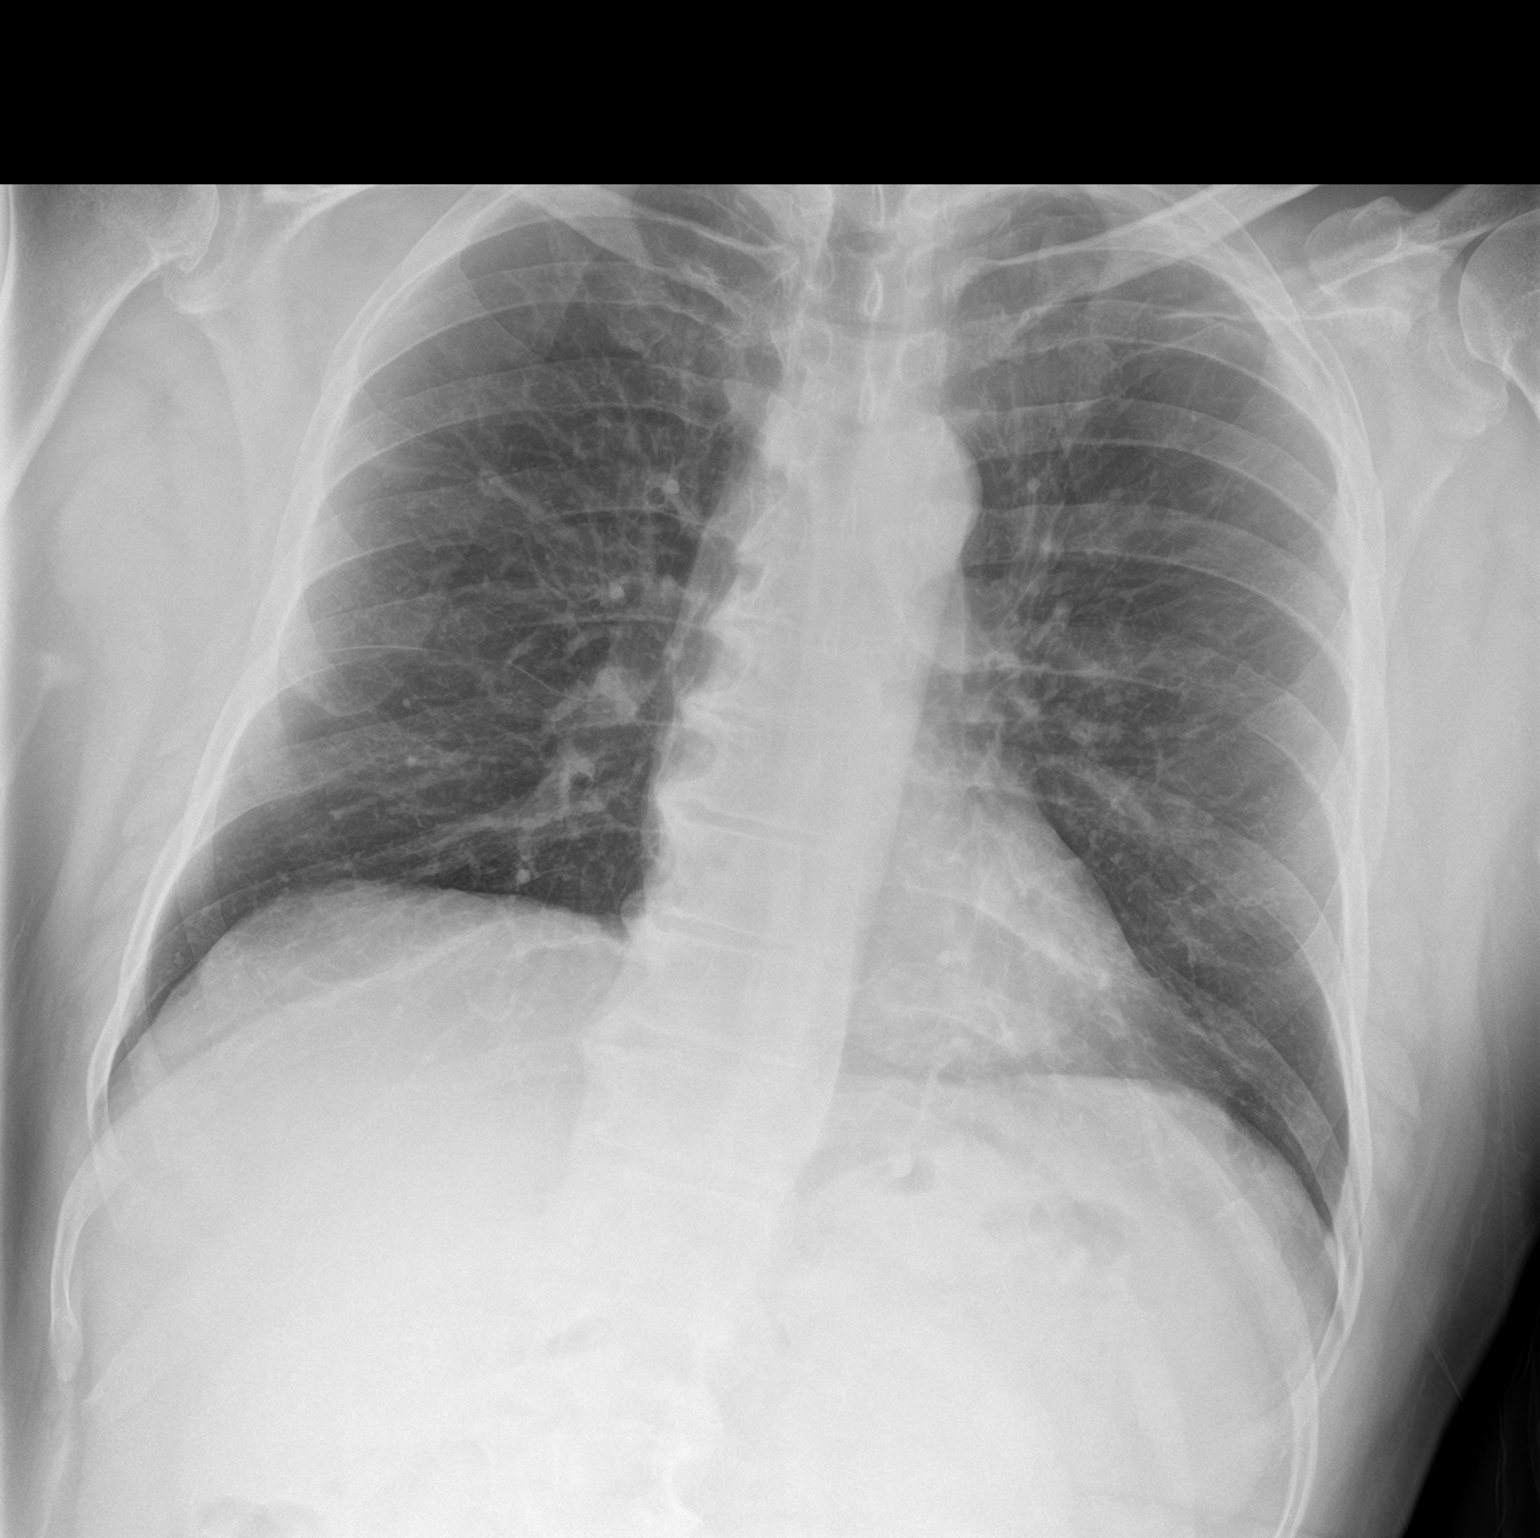

[chest lat]
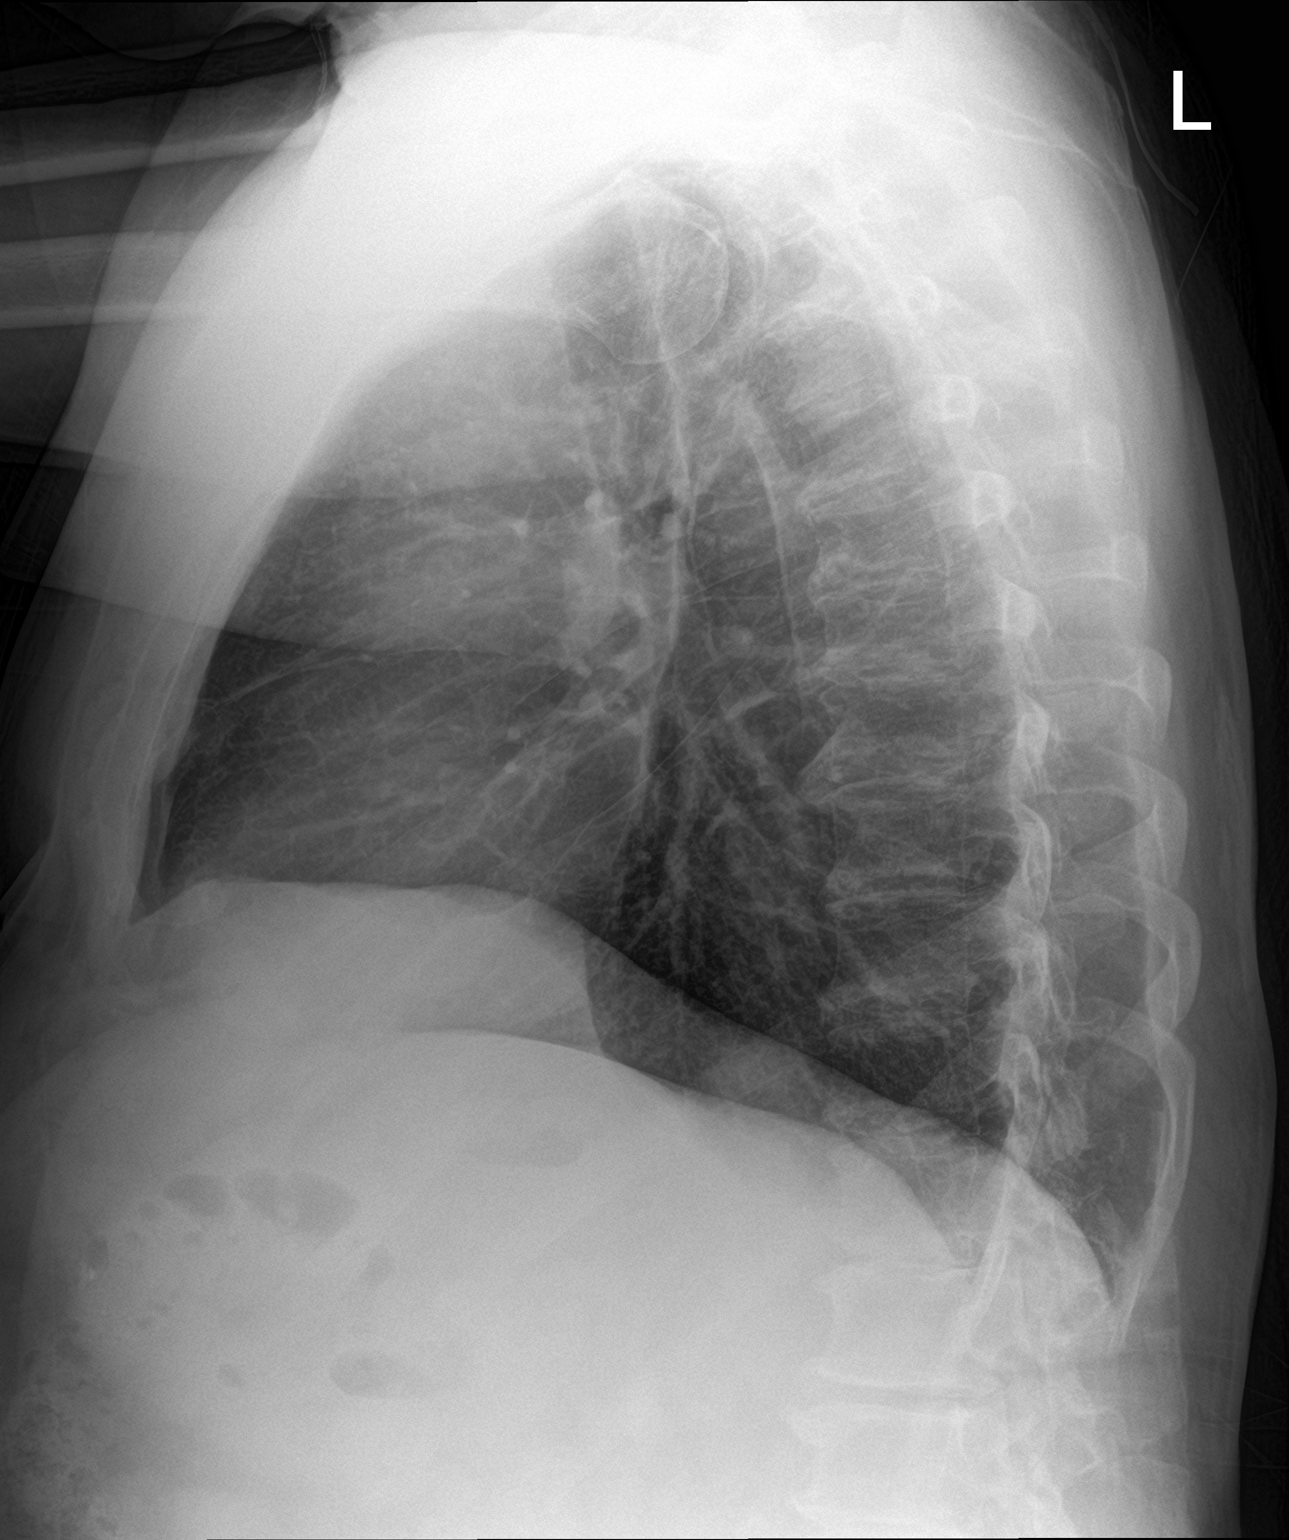

[2 of 2 positions shown; findings below may reference images not displayed]

FINDINGS: Lung volumes are normal. No consolidative airspace disease. No
pleural effusions. No pneumothorax. No pulmonary nodule or mass
noted. Pulmonary vasculature and the cardiomediastinal silhouette
are within normal limits.
IMPRESSION: No radiographic evidence of acute cardiopulmonary disease.

## 2021-08-07 DIAGNOSIS — M461 Sacroiliitis, not elsewhere classified: Secondary | ICD-10-CM | POA: Diagnosis not present

## 2021-08-14 ENCOUNTER — Other Ambulatory Visit: Payer: Self-pay

## 2021-08-14 ENCOUNTER — Telehealth (INDEPENDENT_AMBULATORY_CARE_PROVIDER_SITE_OTHER): Payer: PPO | Admitting: Psychiatry

## 2021-08-14 ENCOUNTER — Encounter (HOSPITAL_COMMUNITY): Payer: Self-pay

## 2021-08-14 ENCOUNTER — Encounter (HOSPITAL_COMMUNITY): Payer: Self-pay | Admitting: Psychiatry

## 2021-08-14 DIAGNOSIS — F3162 Bipolar disorder, current episode mixed, moderate: Secondary | ICD-10-CM

## 2021-08-14 DIAGNOSIS — F900 Attention-deficit hyperactivity disorder, predominantly inattentive type: Secondary | ICD-10-CM | POA: Diagnosis not present

## 2021-08-14 MED ORDER — LAMOTRIGINE 200 MG PO TABS: 200.0000 mg | ORAL_TABLET | Freq: Every day | ORAL | 2 refills | Status: DC

## 2021-08-14 MED ORDER — LISDEXAMFETAMINE DIMESYLATE 70 MG PO CAPS: 70.0000 mg | ORAL_CAPSULE | Freq: Every day | ORAL | 0 refills | Status: DC

## 2021-08-14 NOTE — Progress Notes (Signed)
Virtual Visit via Telephone Note  I connected with Philip Gates on 08/14/21 at 10:00 AM EST by telephone and verified that I am speaking with the correct person using two identifiers.  Location: Patient: home Provider: office   I discussed the limitations, risks, security and privacy concerns of performing an evaluation and management service by telephone and the availability of in person appointments. I also discussed with the patient that there may be a patient responsible charge related to this service. The patient expressed understanding and agreed to proceed.     I discussed the assessment and treatment plan with the patient. The patient was provided an opportunity to ask questions and all were answered. The patient agreed with the plan and demonstrated an understanding of the instructions.   The patient was advised to call back or seek an in-person evaluation if the symptoms worsen or if the condition fails to improve as anticipated.  I provided 15 minutes of non-face-to-face time during this encounter.   [name], MD  [facility] MD/PA/NP OP Progress Note  08/14/2021 10:20 AM Philip Gates  MRN:  [id number]  Chief Complaint:  Chief Complaint   Depression; Manic Behavior; ADD; Follow-up    HPI: This patient is a 65 year old married white male who lives with his wife in [place].  He used to work in [employer], [employer] [place] but has been retired for several years.  The patient returns for follow-up regarding his mood swings and ADHD.  He states that he continues to do well.  He is still coaching a travel baseball team.  He states that his mood has been good and he denies either depression or manic symptoms.  He is focusing well with the Vyvanse.  He recently had pneumonia but is gotten over it.  He was having trouble with hot flashes but all of his hormone levels were good and the flashes have subsided. Visit Diagnosis:    ICD-10-CM   1. Attention deficit  hyperactivity disorder (ADHD), predominantly inattentive type  F90.0     2. 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   Venous insufficiency     Past Surgical History:  Procedure Laterality Date   cerebral stent     COLONOSCOPY N/A 01/02/2017   Procedure: COLONOSCOPY;  Surgeon: Rogene Houston, MD;  [facility];  Service: Endoscopy;  Laterality: N/A;   dental implant  09/26/2014   upper and lower dental implant/bridges- permanent   ESOPHAGOGASTRODUODENOSCOPY N/A 01/02/2017   Procedure: ESOPHAGOGASTRODUODENOSCOPY (EGD);  Surgeon: Laural Golden,  Mechele Dawley, MD;  [facility];  Service: Endoscopy;  Laterality: N/A;  910   EYE SURGERY Left Jan. 16, 2017   Cataract   IR ANGIO INTRA EXTRACRAN SEL INTERNAL CAROTID BILAT MOD SED  01/20/2017   IR ANGIO VERTEBRAL SEL VERTEBRAL UNI L MOD SED  01/20/2017   JOINT REPLACEMENT Right    Knee   JOINT REPLACEMENT Left    Hip   NOSE SURGERY     RADIOLOGY WITH ANESTHESIA N/A 12/29/2014   Procedure: Embolization;  Surgeon: Consuella Lose, MD;  Location: [facility];  Service: Radiology;  Laterality: N/A;   SUBCLAVIAN ANGIOGRAM  Nov. 8, 2016   TOTAL HIP ARTHROPLASTY Left  01/03/2013   Procedure: TOTAL HIP ARTHROPLASTY ANTERIOR APPROACH;  Surgeon: Gearlean Alf, MD;  [facility];  Service: Orthopedics;  Laterality: Left;   TOTAL KNEE ARTHROPLASTY Right 10/06/2014   Procedure: RIGHT TOTAL KNEE ARTHROPLASTY;  Surgeon: Gearlean Alf, MD;  [facility];  Service: Orthopedics;  Laterality: Right;   WISDOM TOOTH EXTRACTION      Family Psychiatric History: see below  Family History:  Family History  Problem Relation Age of Onset   Arrhythmia Mother        Has pacemaker   Hypertension Mother    Heart disease Mother    Mental illness Mother    Varicose Veins Mother    Depression Mother    Colon cancer Father    Cancer Father        Prostate and Colon   Diabetes Father    Hypertension Father    Cancer - Colon Father    Depression Sister     Social History:  Social History   Socioeconomic History   Marital status: Married    Spouse name: Not on file   Number of children: Not on file   Years of education: Not on file   Highest education level: Not on file  Occupational History   Not on file  Tobacco Use   Smoking status: Former    Packs/day: 0.50    Types: Cigarettes    Start date: 05/23/2015    Quit date: 10/09/2017    Years since quitting: 3.8   Smokeless tobacco: Former    Types: Snuff   Tobacco comments:    Stated "I smoke off and on"  Vaping Use   Vaping Use: Never used  Substance and Sexual Activity   Alcohol use: No    Alcohol/week: 0.0 standard drinks   Drug use: No   Sexual activity: Yes    Partners: Female  Other Topics Concern   Not on file  Social History Narrative   Not on file   Social Determinants of Health   Financial Resource Strain: Not on file  Food Insecurity: Not on file  Transportation Needs: Not on file  Physical Activity: Not on file  Stress: Not on file  Social Connections: Not on file    Allergies:  Allergies  Allergen Reactions   Cleocin [Clindamycin] Other (See Comments)    Abdominal  pain "twisted stomach"   Erythromycin     Caused a "twisted stomach"   Iodine    Penicillins Rash    Metabolic Disorder Labs: No results found for: HGBA1C, MPG No results found for: PROLACTIN No results found for: CHOL, TRIG, HDL, CHOLHDL, VLDL, LDLCALC No results found for: TSH  Therapeutic Level Labs: No results found for: LITHIUM No results found for: VALPROATE No components found for:  CBMZ  Current  Medications: Current Outpatient Medications  Medication Sig Dispense Refill   Ascorbic Acid (VITAMIN C) 1000 MG tablet Take 1,000 mg by mouth in the morning and at bedtime.     aspirin EC 81 MG tablet Take 81 mg by mouth in the morning. Swallow whole.     BLACK ELDERBERRY PO Take 1 capsule by mouth in the morning and at bedtime.     BREZTRI AEROSPHERE 160-9-4.8 MCG/ACT AERO Inhale 1 puff into the lungs in the morning and at bedtime.     cyclobenzaprine (FLEXERIL) 5 MG tablet Take 1 tablet (5 mg total) by mouth 3 (three) times daily as needed for muscle spasms. 30 tablet 0   dexlansoprazole (DEXILANT) 60 MG capsule Take 60 mg by mouth every morning.      Dutasteride-Tamsulosin HCl 0.5-0.4 MG CAPS Take 1 capsule by mouth in the morning.     famotidine (PEPCID) 20 MG tablet Take 20 mg by mouth in the morning and at bedtime.     Homeopathic Products (MUSCLE CRAMP COMPLEX PO) Take 1 capsule by mouth in the morning and at bedtime. Cramp Defense Magnesium for Leg Cramps, Muscle Cramps & Muscle Spasms.     IBU 800 MG tablet Take 800 mg by mouth every 8 (eight) hours as needed (pain).     lamoTRIgine (LAMICTAL) 200 MG tablet Take 1 tablet (200 mg total) by mouth at bedtime. 30 tablet 2   levocetirizine (XYZAL) 5 MG tablet Take 5 mg by mouth at bedtime.     levofloxacin (LEVAQUIN) 750 MG tablet Take 1 tablet (750 mg total) by mouth daily. 7 tablet 0   lisdexamfetamine (VYVANSE) 70 MG capsule Take 1 capsule (70 mg total) by mouth daily. 30 capsule 0   lisdexamfetamine (VYVANSE) 70 MG capsule  Take 1 capsule (70 mg total) by mouth daily. 30 capsule 0   lisdexamfetamine (VYVANSE) 70 MG capsule Take 1 capsule (70 mg total) by mouth daily. 30 capsule 0   metoprolol succinate (TOPROL-XL) 50 MG 24 hr tablet Take 50 mg by mouth at bedtime.     Misc Natural Product Nasal (NASAL CLEANSE RINSE MIX) PACK Place 1 Dose into the nose daily. W/Azithromycin     Misc Natural Products (PROSTATE HEALTH) CAPS Take 1 capsule by mouth in the morning and at bedtime.     mometasone (ELOCON) 0.1 % cream Apply 1 application topically daily as needed (skin irritation).     montelukast (SINGULAIR) 10 MG tablet Take 10 mg by mouth every morning.     Multiple Vitamins-Minerals (MULTIVITAMIN WITH MINERALS) tablet Take 1 tablet by mouth daily. Multivitamin for Women (needs the iron)     OVER THE COUNTER MEDICATION Take 1 capsule by mouth in the morning. Ketones BHB     predniSONE (DELTASONE) 20 MG tablet Take 2 tablets daily with breakfast. 10 tablet 0   PROAIR HFA 108 (90 Base) MCG/ACT inhaler Inhale 1-2 puffs into the lungs every 6 (six) hours as needed for shortness of breath or wheezing.     Probiotic Product (DIGESTIVE ADV MULTI-STRAIN PO) Take 1 capsule by mouth in the morning and at bedtime.     promethazine (PHENERGAN) 25 MG tablet Take 25 mg by mouth every 6 (six) hours as needed for nausea or vomiting.     tadalafil (CIALIS) 20 MG tablet Take 20 mg by mouth at bedtime as needed for erectile dysfunction.     telmisartan (MICARDIS) 40 MG tablet Take 40 mg by mouth in the morning.  testosterone cypionate (DEPOTESTOTERONE CYPIONATE) 200 MG/ML injection Inject 200 mg into the muscle every 14 (fourteen) days.      No current facility-administered medications for this visit.     Musculoskeletal: Strength & Muscle Tone: na Gait & Station: na Patient leans: N/A  Psychiatric Specialty Exam: Review of Systems  Musculoskeletal:  Positive for back pain.  All other systems reviewed and are negative.  There  were no vitals taken for this visit.There is no height or weight on file to calculate BMI.  General Appearance: NA  Eye Contact:  NA  Speech:  Clear and Coherent  Volume:  Normal  Mood:  Euthymic  Affect:  NA  Thought Process:  Goal Directed  Orientation:  Full (Time, Place, and Person)  Thought Content: WDL   Suicidal Thoughts:  No  Homicidal Thoughts:  No  Memory:  Immediate;   Good Recent;   Good Remote;   Good  Judgement:  Good  Insight:  Fair  Psychomotor Activity:  Normal  Concentration:  Concentration: Good and Attention Span: Good  Recall:  Good  Fund of Knowledge: Good  Language: Good  Akathisia:  No  Handed:  Right  AIMS (if indicated): not done  Assets:  Communication Skills Desire for Improvement Resilience Social Support Talents/Skills  ADL's:  Intact  Cognition: WNL  Sleep:  Good   Screenings: PHQ2-9    Flowsheet Row Video Visit from 08/14/2021 in [facility] Video Visit from 05/20/2021 in [facility]  PHQ-2 Total Score 0 0      Flowsheet Row Video Visit from 08/14/2021 in [facility] from 07/20/2021 in [facility] from 12/12/2020 in [facility] No Risk No Risk Error: Question 6 not populated        Assessment and Plan: This patient is a 65 year old male with a history of bipolar disorder and ADHD.  He continues to do well on his current regimen.  His focus and mood are both good.  He will continue Lamictal 200 mg daily at bedtime for mood stabilization and Vyvanse 70 mg every morning for ADHD.  He will return to see me in 3 months   [name], MD 08/14/2021, 10:20 AM

## 2021-08-16 DIAGNOSIS — E782 Mixed hyperlipidemia: Secondary | ICD-10-CM | POA: Diagnosis not present

## 2021-08-16 DIAGNOSIS — E663 Overweight: Secondary | ICD-10-CM | POA: Insufficient documentation

## 2021-08-16 DIAGNOSIS — J0100 Acute maxillary sinusitis, unspecified: Secondary | ICD-10-CM | POA: Diagnosis not present

## 2021-08-16 DIAGNOSIS — R051 Acute cough: Secondary | ICD-10-CM | POA: Diagnosis not present

## 2021-08-16 DIAGNOSIS — N529 Male erectile dysfunction, unspecified: Secondary | ICD-10-CM | POA: Diagnosis not present

## 2021-08-27 ENCOUNTER — Encounter: Payer: Self-pay | Admitting: Emergency Medicine

## 2021-08-27 ENCOUNTER — Ambulatory Visit
Admission: EM | Admit: 2021-08-27 | Discharge: 2021-08-27 | Disposition: A | Discharge status: Home / Self Care | Payer: PPO | Attending: Student | Admitting: Student

## 2021-08-27 ENCOUNTER — Other Ambulatory Visit: Payer: Self-pay

## 2021-08-27 DIAGNOSIS — J441 Chronic obstructive pulmonary disease with (acute) exacerbation: Secondary | ICD-10-CM | POA: Diagnosis not present

## 2021-08-27 DIAGNOSIS — J069 Acute upper respiratory infection, unspecified: Secondary | ICD-10-CM | POA: Diagnosis not present

## 2021-08-27 MED ORDER — DOXYCYCLINE HYCLATE 100 MG PO CAPS: 100.0000 mg | ORAL_CAPSULE | Freq: Two times a day (BID) | ORAL | 0 refills | Status: AC

## 2021-08-27 MED ORDER — BENZONATATE 100 MG PO CAPS: 100.0000 mg | ORAL_CAPSULE | Freq: Three times a day (TID) | ORAL | 0 refills | Status: DC

## 2021-08-27 NOTE — Discharge Instructions (Signed)
-  Doxycycline twice daily for 7 days.  Make sure to wear sunscreen while spending time outside while on this medication as it can increase your chance of sunburn. You can take this medication with food if you have a sensitive stomach. -Tessalon (Benzonatate) as needed for cough. Take one pill up to 3x daily (every 8 hours) -Albuterol inhaler as needed for cough, wheezing, shortness of breath, 1 to 2 puffs every 6 hours as needed. -Follow-up if symptoms worsen/persist

## 2021-08-27 NOTE — ED Triage Notes (Signed)
Chest congestion, productive cough with yellowish green sputum since Friday.  Finished a course of prednisone on 12/9 for same symptoms and also an antibiotic.

## 2021-08-27 NOTE — ED Provider Notes (Addendum)
[facility]    CSN: [id number] Arrival date & time: 08/27/21  1228      History   Chief Complaint No chief complaint on file.   HPI Philip Gates is a 65 y.o. male presenting with cough for 5 days.  Medical history extensive as below, including COPD and dyspnea.  He was treated for left lower lobe pneumonia at our urgent care on 11/12 with Levaquin with resolution of the symptoms.  He was then treated for sinusitis on 12/9 also with Levaquin with resolution of the symptoms.  However he is here today with 5 days of cough productive of yellow and green sputum, dyspnea, fast heart rate, malaise.  Albuterol inhaler is providing some relief, he last use this about 6 hours ago.  He has not monitored his temperature at home.  He has not attempted other medications for the symptoms.  Denies known sick contacts.  Denies chest pain, dizziness, weakness.  Tolerating fluids and food.  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   Seasonal allergies 10/15/2020   Chronic pain syndrome 10/15/2020   Mixed  simple and mucopurulent chronic bronchitis ([place]) 10/15/2020   Male erectile disorder 10/15/2020   History of cerebral aneurysm repair 10/15/2020   Degenerative disc disease, cervical 10/15/2020   Benign prostatic hyperplasia without lower urinary tract symptoms 10/15/2020   Essential hypertension 06/27/2020   Body mass index (BMI) 29.0-29.9, adult 05/22/2020   Pain in joint of left shoulder 09/12/2019   Pain in joint of left elbow 09/12/2019   Pain in thumb joint with movement of left hand 09/12/2019   Osteoarthritis of carpometacarpal (CMC) joint of thumb 09/12/2019   Spinal stenosis of lumbar region without neurogenic claudication 01/19/2019   Prolapsed lumbar disc 01/19/2019   Stenosis of intervertebral foramina 10/04/2018   Cervical radiculitis 10/04/2018   Acute back pain with sciatica 07/19/2018   Family hx of colon cancer 11/13/2016   Absolute anemia 11/13/2016   Varicose veins of right lower extremity with complications 66/02/3015   Bipolar 1 disorder, mixed, moderate ([place]) 10/12/2015   Attention deficit hyperactivity disorder (ADHD), combined type 10/12/2015   Neck pain 09/13/2015   Chronic low back pain 09/13/2015   Chronic venous insufficiency 06/25/2015   SBO (small bowel obstruction) ([place]) 01/16/2015   OA (osteoarthritis) of knee 10/06/2014   Intracranial aneurysm 09/25/2014   Facial cellulitis 05/31/2014   Asthma    OA (osteoarthritis) of hip  01/03/2013    Past Surgical History:  Procedure Laterality Date   cerebral stent     COLONOSCOPY N/A 01/02/2017   Procedure: COLONOSCOPY;  Surgeon: Rogene Houston, MD;  Location: [facility];  Service: Endoscopy;  Laterality: N/A;   dental implant  09/26/2014   upper and lower dental implant/bridges- permanent   ESOPHAGOGASTRODUODENOSCOPY N/A 01/02/2017   Procedure: ESOPHAGOGASTRODUODENOSCOPY (EGD);  Surgeon: Rogene Houston, MD;  Location: [facility];  Service: Endoscopy;  Laterality: N/A;  910   EYE SURGERY Left  Jan. 16, 2017   Cataract   IR ANGIO INTRA EXTRACRAN SEL INTERNAL CAROTID BILAT MOD SED  01/20/2017   IR ANGIO VERTEBRAL SEL VERTEBRAL UNI L MOD SED  01/20/2017   JOINT REPLACEMENT Right    Knee   JOINT REPLACEMENT Left    Hip   NOSE SURGERY     RADIOLOGY WITH ANESTHESIA N/A 12/29/2014   Procedure: Embolization;  Surgeon: Consuella Lose, MD;  Location: [facility];  Service: Radiology;  Laterality: N/A;   SUBCLAVIAN ANGIOGRAM  Nov. 8, 2016   TOTAL HIP ARTHROPLASTY Left 01/03/2013   Procedure: TOTAL HIP ARTHROPLASTY ANTERIOR APPROACH;  Surgeon: Gearlean Alf, MD;  Location: [facility];  Service: Orthopedics;  Laterality: Left;   TOTAL KNEE ARTHROPLASTY Right 10/06/2014   Procedure: RIGHT TOTAL KNEE ARTHROPLASTY;  Surgeon: Gearlean Alf, MD;  Location: [facility];  Service: Orthopedics;  Laterality: Right;   WISDOM TOOTH EXTRACTION         Home Medications    Prior to Admission medications   Medication Sig Start Date End Date Taking? Authorizing Provider  benzonatate (TESSALON) 100 MG capsule Take 1 capsule (100 mg total) by mouth every 8 (eight) hours. 08/27/21  Yes Hazel Sams, PA-C  doxycycline (VIBRAMYCIN) 100 MG capsule Take 1 capsule (100 mg total) by mouth 2 (two) times daily for 7 days. 08/27/21 09/03/21 Yes Hazel Sams, PA-C  Ascorbic Acid (VITAMIN C) 1000 MG tablet Take 1,000 mg by mouth in the morning and at bedtime.    [provider]  aspirin EC 81 MG tablet Take 81 mg by mouth in the morning. Swallow whole.    [provider]  BLACK ELDERBERRY PO Take 1 capsule by mouth in the morning and at bedtime.    [provider]  BREZTRI AEROSPHERE 160-9-4.8 MCG/ACT AERO Inhale 1 puff into the lungs in the morning and at bedtime. 11/16/20   [provider]  cyclobenzaprine (FLEXERIL) 5 MG tablet Take 1 tablet (5 mg total) by mouth 3 (three) times daily as needed for muscle spasms. 12/08/20   Meyran, Ocie Cornfield, NP  dexlansoprazole (DEXILANT) 60 MG  capsule Take 60 mg by mouth every morning.     [provider]  Dutasteride-Tamsulosin HCl 0.5-0.4 MG CAPS Take 1 capsule by mouth in the morning.    [provider]  famotidine (PEPCID) 20 MG tablet Take 20 mg by mouth in the morning and at bedtime. 11/06/20   [provider]  Homeopathic Products (MUSCLE CRAMP COMPLEX PO) Take 1 capsule by mouth in the morning and at bedtime. Cramp Defense Magnesium for Leg Cramps, Muscle Cramps & Muscle Spasms.    [provider]  IBU 800 MG tablet Take 800 mg by mouth every 8 (eight) hours as needed (pain). 11/16/20   [provider]  lamoTRIgine (LAMICTAL) 200 MG tablet Take 1 tablet (200 mg total) by mouth at bedtime. 08/14/21   Cloria Spring, MD  levocetirizine Harlow Ohms)  5 MG tablet Take 5 mg by mouth at bedtime. 11/10/16   [provider]  lisdexamfetamine (VYVANSE) 70 MG capsule Take 1 capsule (70 mg total) by mouth daily. 08/14/21   Cloria Spring, MD  lisdexamfetamine (VYVANSE) 70 MG capsule Take 1 capsule (70 mg total) by mouth daily. 08/14/21   Cloria Spring, MD  lisdexamfetamine (VYVANSE) 70 MG capsule Take 1 capsule (70 mg total) by mouth daily. 08/14/21   Cloria Spring, MD  metoprolol succinate (TOPROL-XL) 50 MG 24 hr tablet Take 50 mg by mouth at bedtime. 11/16/20   [provider]  Misc Natural Product Nasal (NASAL CLEANSE RINSE MIX) PACK Place 1 Dose into the nose daily. W/Azithromycin    [provider]  Misc Natural Products (PROSTATE HEALTH) CAPS Take 1 capsule by mouth in the morning and at bedtime.    [provider]  mometasone (ELOCON) 0.1 % cream Apply 1 application topically daily as needed (skin irritation). 10/15/20   [provider]  montelukast (SINGULAIR) 10 MG tablet Take 10 mg by mouth every morning.    [provider]  Multiple Vitamins-Minerals (MULTIVITAMIN WITH MINERALS) tablet Take 1 tablet by mouth daily. Multivitamin for Women (needs  the iron)    [provider]  OVER THE COUNTER MEDICATION Take 1 capsule by mouth in the morning. Ketones BHB    [provider]  PROAIR HFA 108 (90 Base) MCG/ACT inhaler Inhale 1-2 puffs into the lungs every 6 (six) hours as needed for shortness of breath or wheezing. 10/21/17   [provider]  Probiotic Product (DIGESTIVE ADV MULTI-STRAIN PO) Take 1 capsule by mouth in the morning and at bedtime.    [provider]  promethazine (PHENERGAN) 25 MG tablet Take 25 mg by mouth every 6 (six) hours as needed for nausea or vomiting.    [provider]  tadalafil (CIALIS) 20 MG tablet Take 20 mg by mouth at bedtime as needed for erectile dysfunction.    [provider]  telmisartan (MICARDIS) 40 MG tablet Take 40 mg by mouth in the morning. 09/14/20   [provider]  testosterone cypionate (DEPOTESTOTERONE CYPIONATE) 200 MG/ML injection Inject 200 mg into the muscle every 14 (fourteen) days.  12/16/14   [provider]    Family History Family History  Problem Relation Age of Onset   Arrhythmia Mother        Has pacemaker   Hypertension Mother    Heart disease Mother    Mental illness Mother    Varicose Veins Mother    Depression Mother    Colon cancer Father    Cancer Father        Prostate and Colon   Diabetes Father    Hypertension Father    Cancer - Colon Father    Depression Sister     Social History Social History   Tobacco Use   Smoking status: Former    Packs/day: 0.50    Types: Cigarettes    Start date: 05/23/2015    Quit date: 10/09/2017    Years since quitting: 3.8   Smokeless tobacco: Former    Types: Snuff   Tobacco comments:    Stated "I smoke off and on"  Vaping Use   Vaping Use: Never used  Substance Use Topics   Alcohol use: No    Alcohol/week: 0.0 standard drinks   Drug use: No     Allergies   Cleocin [clindamycin], Erythromycin, Iodine, and Penicillins  Review of Systems Review of  Systems  Constitutional:  Positive for fever. Negative for appetite change and chills.  HENT:  Negative for congestion, ear pain, rhinorrhea, sinus pressure, sinus pain and sore throat.   Eyes:  Negative for redness and visual disturbance.  Respiratory:  Positive for cough and shortness of breath. Negative for chest tightness and wheezing.   Cardiovascular:  Negative for chest pain and palpitations.  Gastrointestinal:  Negative for abdominal pain, constipation, diarrhea, nausea and vomiting.  Genitourinary:  Negative for dysuria, frequency and urgency.  Musculoskeletal:  Negative for myalgias.  Neurological:  Negative for dizziness, weakness and headaches.  Psychiatric/Behavioral:  Negative for confusion.   All other systems reviewed and are negative.   Physical Exam Triage Vital Signs ED Triage Vitals  Enc Vitals Group     BP 08/27/21 1421 123/84     Pulse Rate 08/27/21 1421 (!) 122     Resp 08/27/21 1421 18     Temp 08/27/21 1421 97.7 F (36.5 C)     Temp Source 08/27/21 1421 Oral     SpO2 08/27/21 1421 94 %     Weight --      Height --      Head Circumference --      Peak Flow --      Pain Score 08/27/21 1422 5     Pain Loc --      Pain Edu? --      Excl. in [place]? --    No data found.  Updated Vital Signs BP 123/84 (BP Location: Right Arm)    Pulse (!) 122    Temp 97.7 F (36.5 C) (Oral)    Resp 18    SpO2 94%   Visual Acuity Right Eye Distance:   Left Eye Distance:   Bilateral Distance:    Right Eye Near:   Left Eye Near:    Bilateral Near:     Physical Exam Vitals reviewed.  Constitutional:      General: He is not in acute distress.    Appearance: Normal appearance. He is not ill-appearing or diaphoretic.  HENT:     Head: Normocephalic and atraumatic.     Right Ear: Tympanic membrane, ear canal and external ear normal. No tenderness. No middle ear effusion. There is no impacted cerumen. Tympanic membrane is not perforated, erythematous, retracted or bulging.      Left Ear: Tympanic membrane, ear canal and external ear normal. No tenderness.  No middle ear effusion. There is no impacted cerumen. Tympanic membrane is not perforated, erythematous, retracted or bulging.     Nose: Nose normal. No congestion.     Mouth/Throat:     Mouth: Mucous membranes are moist.     Pharynx: Uvula midline. No oropharyngeal exudate or posterior oropharyngeal erythema.  Eyes:     Extraocular Movements: Extraocular movements intact.     Pupils: Pupils are equal, round, and reactive to light.  Cardiovascular:     Rate and Rhythm: Regular rhythm. Tachycardia present.     Pulses:          Radial pulses are 2+ on the right side and 2+ on the left side.     Heart sounds: Normal heart sounds.  Pulmonary:     Effort: Pulmonary effort is normal.     Breath sounds: Normal breath sounds. No decreased breath sounds, wheezing, rhonchi or rales.     Comments: Frequent cough  Abdominal:     Palpations: Abdomen is soft.  Tenderness: There is no abdominal tenderness. There is no guarding or rebound.  Musculoskeletal:     Right lower leg: No edema.     Left lower leg: No edema.  Lymphadenopathy:     Cervical: No cervical adenopathy.     Right cervical: No superficial cervical adenopathy.    Left cervical: No superficial cervical adenopathy.  Skin:    General: Skin is warm.     Capillary Refill: Capillary refill takes less than 2 seconds.  Neurological:     General: No focal deficit present.     Mental Status: He is alert and oriented to person, place, and time.  Psychiatric:        Mood and Affect: Mood normal.        Behavior: Behavior normal.        Thought Content: Thought content normal.        Judgment: Judgment normal.     UC Treatments / Results  Labs (all labs ordered are listed, but only abnormal results are displayed) Labs Reviewed - No data to display  EKG   Radiology No results found.  Procedures Procedures (including critical care  time)  Medications Ordered in UC Medications - No data to display  Initial Impression / Assessment and Plan / UC Course  I have reviewed the triage vital signs and the nursing notes.  Pertinent labs & imaging results that were available during my care of the patient were reviewed by me and considered in my medical decision making (see chart for details).     This patient is a very pleasant 65 y.o. year old male presenting with cough. Afebrile but tachy, Pulse ranging 115-122 and regular. Recent left lower lobe pneumonia 1 month ago, managed with levaquin. Then sinusitis 12/9 also managed with levaquin. He is allergic to macrolides and penicillins. I do have concern for pneumonia given history and vital signs, despite reassuring pulm exam today. We do not have access to xray imaging today. Patient prefers trial of doxycycline, versus outpatient imaging to confirm suspicion of pneumonia. Continue albuterol inhaler,  He declines refills on this. He states he will f/u with ED or PCP if symptoms worsen.   Coding Level 4 for acute illness with systemic symptoms, and prescription drug management   Final Clinical Impressions(s) / UC Diagnoses   Final diagnoses:  Viral URI with cough  COPD exacerbation (HCC)     Discharge Instructions      -Doxycycline twice daily for 7 days.  Make sure to wear sunscreen while spending time outside while on this medication as it can increase your chance of sunburn. You can take this medication with food if you have a sensitive stomach. -Tessalon (Benzonatate) as needed for cough. Take one pill up to 3x daily (every 8 hours) -Albuterol inhaler as needed for cough, wheezing, shortness of breath, 1 to 2 puffs every 6 hours as needed. -Follow-up if symptoms worsen/persist      ED Prescriptions     Medication Sig Dispense Auth. Provider   doxycycline (VIBRAMYCIN) 100 MG capsule Take 1 capsule (100 mg total) by mouth 2 (two) times daily for 7 days. 14 capsule  Hazel Sams, PA-C   benzonatate (TESSALON) 100 MG capsule Take 1 capsule (100 mg total) by mouth every 8 (eight) hours. 21 capsule Hazel Sams, PA-C      PDMP not reviewed this encounter.   Hazel Sams, PA-C 08/27/21 1500    Hazel Sams, PA-C 08/27/21 1500

## 2021-08-28 DIAGNOSIS — M461 Sacroiliitis, not elsewhere classified: Secondary | ICD-10-CM | POA: Diagnosis not present

## 2021-09-10 ENCOUNTER — Inpatient Hospital Stay (HOSPITAL_COMMUNITY)
Admission: EM | Admit: 2021-09-10 | Discharge: 2021-09-14 | DRG: 897 | Disposition: A | Discharge status: Home / Self Care | Payer: PPO | Attending: Family Medicine | Admitting: Family Medicine

## 2021-09-10 ENCOUNTER — Other Ambulatory Visit: Payer: Self-pay

## 2021-09-10 ENCOUNTER — Emergency Department (HOSPITAL_COMMUNITY): Payer: PPO

## 2021-09-10 ENCOUNTER — Encounter (HOSPITAL_COMMUNITY): Payer: Self-pay

## 2021-09-10 DIAGNOSIS — R7401 Elevation of levels of liver transaminase levels: Secondary | ICD-10-CM | POA: Diagnosis not present

## 2021-09-10 DIAGNOSIS — Z9842 Cataract extraction status, left eye: Secondary | ICD-10-CM

## 2021-09-10 DIAGNOSIS — H9193 Unspecified hearing loss, bilateral: Secondary | ICD-10-CM | POA: Diagnosis present

## 2021-09-10 DIAGNOSIS — G2571 Drug induced akathisia: Secondary | ICD-10-CM | POA: Diagnosis present

## 2021-09-10 DIAGNOSIS — R400 Somnolence: Secondary | ICD-10-CM | POA: Diagnosis not present

## 2021-09-10 DIAGNOSIS — E782 Mixed hyperlipidemia: Secondary | ICD-10-CM | POA: Diagnosis present

## 2021-09-10 DIAGNOSIS — I8390 Asymptomatic varicose veins of unspecified lower extremity: Secondary | ICD-10-CM | POA: Diagnosis present

## 2021-09-10 DIAGNOSIS — D649 Anemia, unspecified: Secondary | ICD-10-CM | POA: Diagnosis present

## 2021-09-10 DIAGNOSIS — F1120 Opioid dependence, uncomplicated: Secondary | ICD-10-CM | POA: Diagnosis present

## 2021-09-10 DIAGNOSIS — J449 Chronic obstructive pulmonary disease, unspecified: Secondary | ICD-10-CM | POA: Diagnosis present

## 2021-09-10 DIAGNOSIS — Z8249 Family history of ischemic heart disease and other diseases of the circulatory system: Secondary | ICD-10-CM

## 2021-09-10 DIAGNOSIS — T402X5A Adverse effect of other opioids, initial encounter: Secondary | ICD-10-CM | POA: Diagnosis present

## 2021-09-10 DIAGNOSIS — F909 Attention-deficit hyperactivity disorder, unspecified type: Secondary | ICD-10-CM | POA: Diagnosis present

## 2021-09-10 DIAGNOSIS — I872 Venous insufficiency (chronic) (peripheral): Secondary | ICD-10-CM | POA: Diagnosis present

## 2021-09-10 DIAGNOSIS — Z20822 Contact with and (suspected) exposure to covid-19: Secondary | ICD-10-CM | POA: Diagnosis present

## 2021-09-10 DIAGNOSIS — I10 Essential (primary) hypertension: Secondary | ICD-10-CM | POA: Diagnosis present

## 2021-09-10 DIAGNOSIS — E876 Hypokalemia: Secondary | ICD-10-CM | POA: Diagnosis present

## 2021-09-10 DIAGNOSIS — Z886 Allergy status to analgesic agent status: Secondary | ICD-10-CM

## 2021-09-10 DIAGNOSIS — Z91041 Radiographic dye allergy status: Secondary | ICD-10-CM

## 2021-09-10 DIAGNOSIS — F1123 Opioid dependence with withdrawal: Principal | ICD-10-CM | POA: Diagnosis present

## 2021-09-10 DIAGNOSIS — F319 Bipolar disorder, unspecified: Secondary | ICD-10-CM | POA: Diagnosis present

## 2021-09-10 DIAGNOSIS — M549 Dorsalgia, unspecified: Secondary | ICD-10-CM | POA: Diagnosis present

## 2021-09-10 DIAGNOSIS — J45909 Unspecified asthma, uncomplicated: Secondary | ICD-10-CM | POA: Diagnosis present

## 2021-09-10 DIAGNOSIS — Z79899 Other long term (current) drug therapy: Secondary | ICD-10-CM

## 2021-09-10 DIAGNOSIS — F1010 Alcohol abuse, uncomplicated: Secondary | ICD-10-CM | POA: Diagnosis present

## 2021-09-10 DIAGNOSIS — Z818 Family history of other mental and behavioral disorders: Secondary | ICD-10-CM | POA: Diagnosis not present

## 2021-09-10 DIAGNOSIS — Z87891 Personal history of nicotine dependence: Secondary | ICD-10-CM

## 2021-09-10 DIAGNOSIS — Z6831 Body mass index (BMI) 31.0-31.9, adult: Secondary | ICD-10-CM

## 2021-09-10 DIAGNOSIS — N400 Enlarged prostate without lower urinary tract symptoms: Secondary | ICD-10-CM | POA: Diagnosis present

## 2021-09-10 DIAGNOSIS — G8929 Other chronic pain: Secondary | ICD-10-CM | POA: Diagnosis present

## 2021-09-10 DIAGNOSIS — Z7951 Long term (current) use of inhaled steroids: Secondary | ICD-10-CM

## 2021-09-10 DIAGNOSIS — N179 Acute kidney failure, unspecified: Secondary | ICD-10-CM | POA: Diagnosis present

## 2021-09-10 DIAGNOSIS — Z79890 Hormone replacement therapy (postmenopausal): Secondary | ICD-10-CM

## 2021-09-10 DIAGNOSIS — M461 Sacroiliitis, not elsewhere classified: Secondary | ICD-10-CM | POA: Insufficient documentation

## 2021-09-10 DIAGNOSIS — M545 Low back pain, unspecified: Secondary | ICD-10-CM | POA: Diagnosis not present

## 2021-09-10 DIAGNOSIS — M542 Cervicalgia: Secondary | ICD-10-CM | POA: Diagnosis present

## 2021-09-10 DIAGNOSIS — R451 Restlessness and agitation: Secondary | ICD-10-CM | POA: Diagnosis present

## 2021-09-10 DIAGNOSIS — J4520 Mild intermittent asthma, uncomplicated: Secondary | ICD-10-CM | POA: Diagnosis not present

## 2021-09-10 DIAGNOSIS — E669 Obesity, unspecified: Secondary | ICD-10-CM | POA: Diagnosis present

## 2021-09-10 DIAGNOSIS — Z880 Allergy status to penicillin: Secondary | ICD-10-CM

## 2021-09-10 DIAGNOSIS — Z8679 Personal history of other diseases of the circulatory system: Secondary | ICD-10-CM

## 2021-09-10 DIAGNOSIS — Z79891 Long term (current) use of opiate analgesic: Secondary | ICD-10-CM

## 2021-09-10 DIAGNOSIS — T50905A Adverse effect of unspecified drugs, medicaments and biological substances, initial encounter: Secondary | ICD-10-CM

## 2021-09-10 DIAGNOSIS — Z7982 Long term (current) use of aspirin: Secondary | ICD-10-CM

## 2021-09-10 DIAGNOSIS — K219 Gastro-esophageal reflux disease without esophagitis: Secondary | ICD-10-CM | POA: Diagnosis present

## 2021-09-10 DIAGNOSIS — R4182 Altered mental status, unspecified: Secondary | ICD-10-CM

## 2021-09-10 DIAGNOSIS — M1990 Unspecified osteoarthritis, unspecified site: Secondary | ICD-10-CM | POA: Diagnosis present

## 2021-09-10 DIAGNOSIS — Z881 Allergy status to other antibiotic agents status: Secondary | ICD-10-CM

## 2021-09-10 LAB — COMPREHENSIVE METABOLIC PANEL
ALT: 51 U/L — ABNORMAL HIGH (ref 0–44)
AST: 45 U/L — ABNORMAL HIGH (ref 15–41)
Albumin: 3.9 g/dL (ref 3.5–5.0)
Alkaline Phosphatase: 69 U/L (ref 38–126)
Anion gap: 11 (ref 5–15)
BUN: 21 mg/dL (ref 8–23)
CO2: 21 mmol/L — ABNORMAL LOW (ref 22–32)
Calcium: 8.8 mg/dL — ABNORMAL LOW (ref 8.9–10.3)
Chloride: 102 mmol/L (ref 98–111)
Creatinine, Ser: 1.44 mg/dL — ABNORMAL HIGH (ref 0.61–1.24)
GFR, Estimated: 54 mL/min — ABNORMAL LOW (ref >60)
Glucose, Bld: 90 mg/dL (ref 70–99)
Potassium: 3.7 mmol/L (ref 3.5–5.1)
Sodium: 134 mmol/L — ABNORMAL LOW (ref 135–145)
Total Bilirubin: 0.7 mg/dL (ref 0.3–1.2)
Total Protein: 6.7 g/dL (ref 6.5–8.1)

## 2021-09-10 LAB — CBC WITH DIFFERENTIAL/PLATELET
Abs Immature Granulocytes: 0.03 10*3/uL (ref 0.00–0.07)
Basophils Absolute: 0 10*3/uL (ref 0.0–0.1)
Basophils Relative: 0 %
Eosinophils Absolute: 0.1 10*3/uL (ref 0.0–0.5)
Eosinophils Relative: 1 %
HCT: 37.1 % — ABNORMAL LOW (ref 39.0–52.0)
Hemoglobin: 12.7 g/dL — ABNORMAL LOW (ref 13.0–17.0)
Immature Granulocytes: 0 %
Lymphocytes Relative: 23 %
Lymphs Abs: 1.7 10*3/uL (ref 0.7–4.0)
MCH: 33.6 pg (ref 26.0–34.0)
MCHC: 34.2 g/dL (ref 30.0–36.0)
MCV: 98.1 fL (ref 80.0–100.0)
Monocytes Absolute: 0.5 10*3/uL (ref 0.1–1.0)
Monocytes Relative: 7 %
Neutro Abs: 5.2 10*3/uL (ref 1.7–7.7)
Neutrophils Relative %: 69 %
Platelets: 196 10*3/uL (ref 150–400)
RBC: 3.78 MIL/uL — ABNORMAL LOW (ref 4.22–5.81)
RDW: 13.5 % (ref 11.5–15.5)
WBC: 7.6 10*3/uL (ref 4.0–10.5)
nRBC: 0 % (ref 0.0–0.2)

## 2021-09-10 LAB — ETHANOL: Alcohol, Ethyl (B): <10 mg/dL (ref <10)

## 2021-09-10 LAB — RESP PANEL BY RT-PCR (FLU A&B, COVID) ARPGX2
Influenza A by PCR: NEGATIVE
Influenza B by PCR: NEGATIVE
SARS Coronavirus 2 by RT PCR: NEGATIVE

## 2021-09-10 LAB — RAPID URINE DRUG SCREEN, HOSP PERFORMED
Amphetamines: POSITIVE — AB
Barbiturates: NOT DETECTED
Benzodiazepines: NOT DETECTED
Cocaine: NOT DETECTED
Opiates: POSITIVE — AB
Tetrahydrocannabinol: NOT DETECTED

## 2021-09-10 LAB — CK: Total CK: 156 U/L (ref 49–397)

## 2021-09-10 IMAGING — CT CT HEAD W/O CM
3 of 6 series · 16 of 47 positions shown, 19 images · non-contrast
Comparison: Head CT [DATE]

CLINICAL DATA: Delirium.

EXAM:
CT HEAD WITHOUT CONTRAST
TECHNIQUE: Contiguous axial images were obtained from the base of the skull
through the vertex without intravenous contrast.

[Series 2: head w o · axial · 0.44mm/px · z∈[+1572,+1707]mm · 10 of 35 slices shown, 13 images]
[im 4/35  brain]
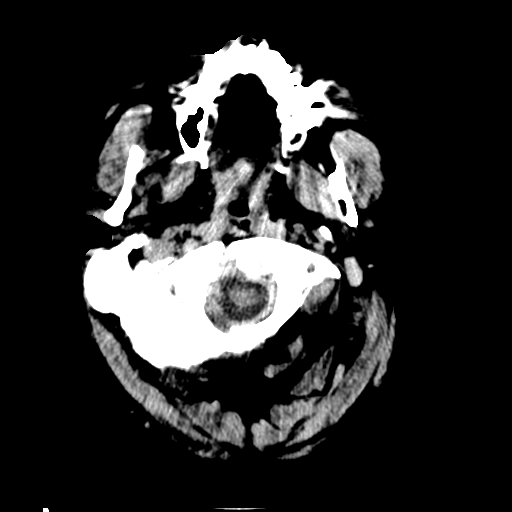
[im 4/35  bone]
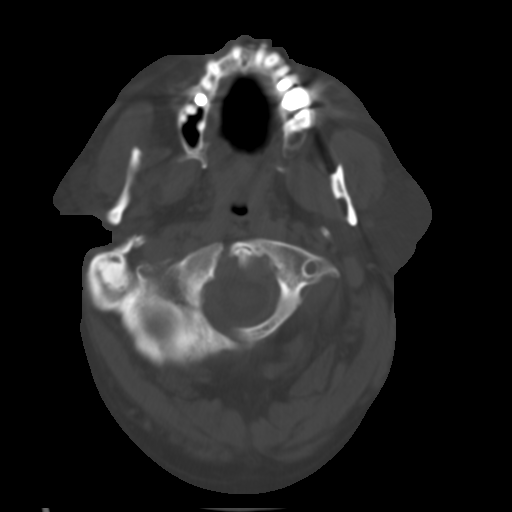
[im 7/35  brain]
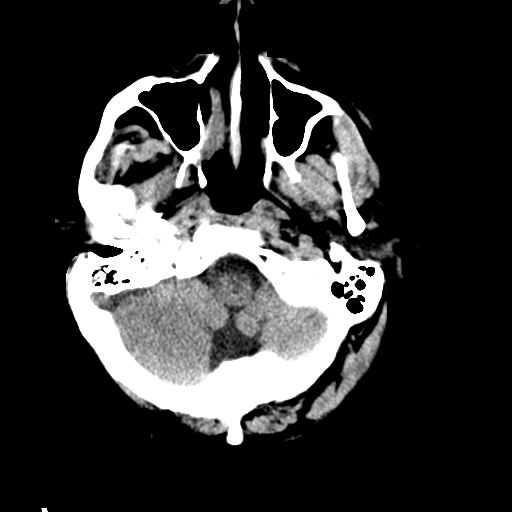
[im 10/35  brain]
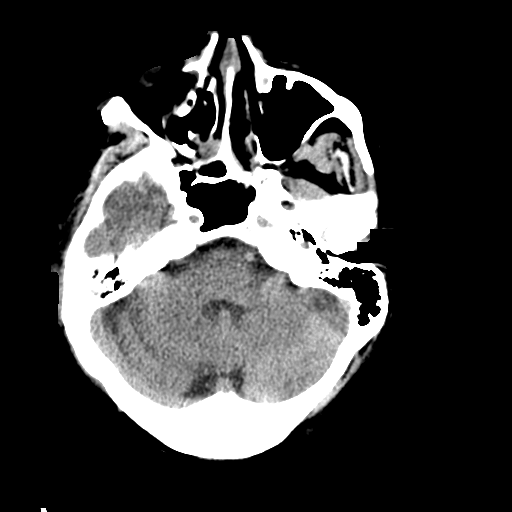
[im 13/35  brain]
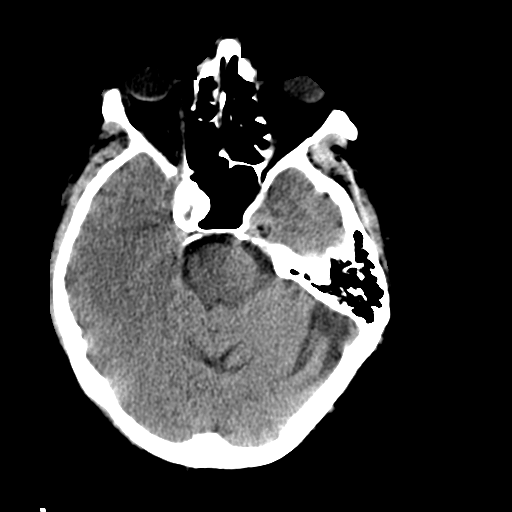
[im 16/35  brain]
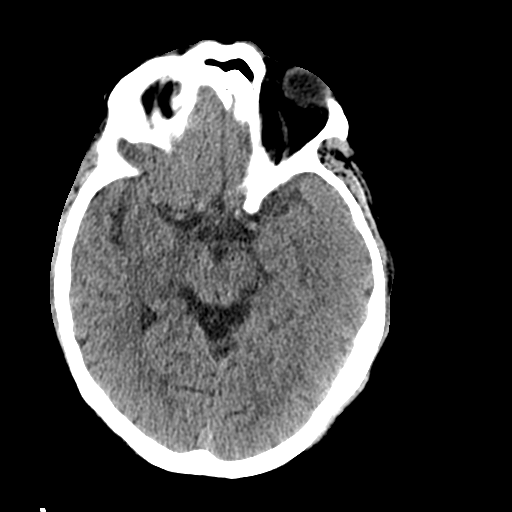
[im 16/35  bone]
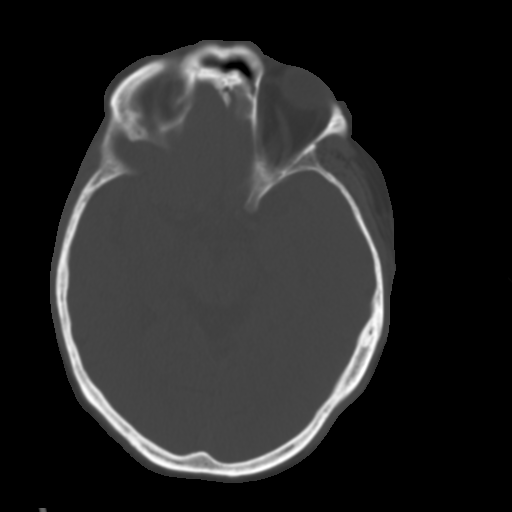
[im 19/35  brain]
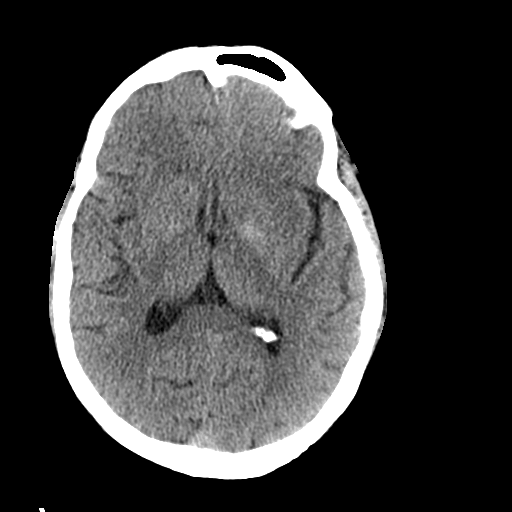
[im 22/35  brain]
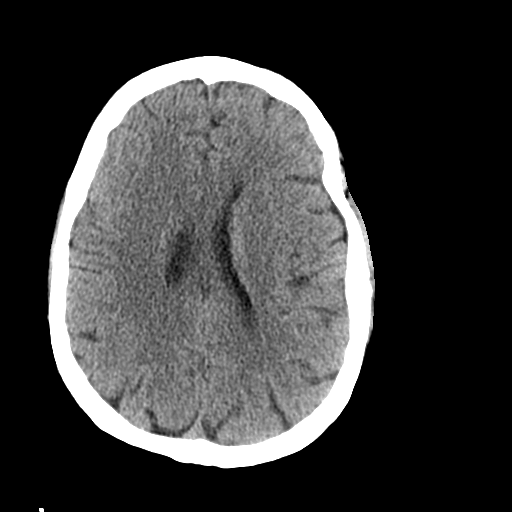
[im 25/35  brain]
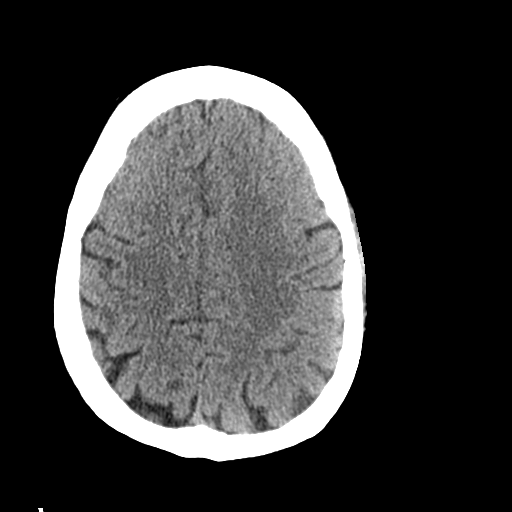
[im 28/35  brain]
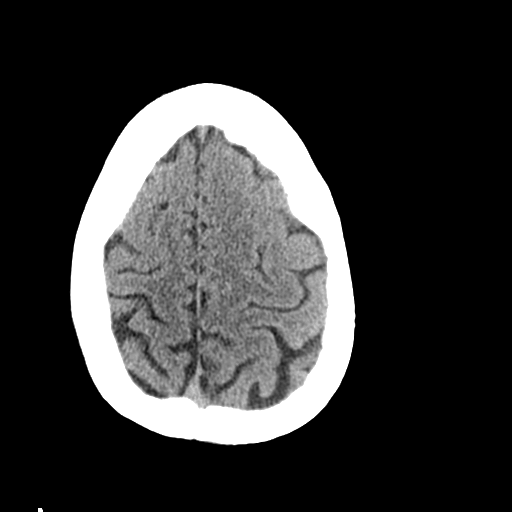
[im 28/35  bone]
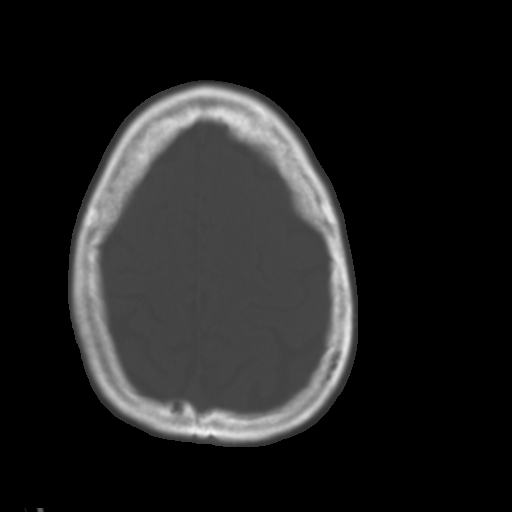
[im 31/35  brain]
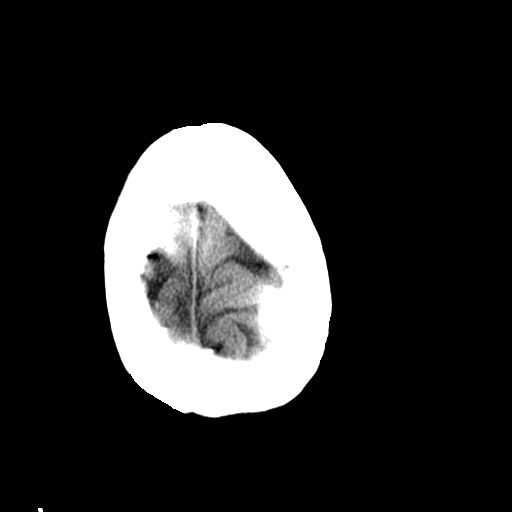

[Series 4: coronal soft · coronal · 0.34mm/px · 3 of 75 slices shown]
[im 15/75  brain]
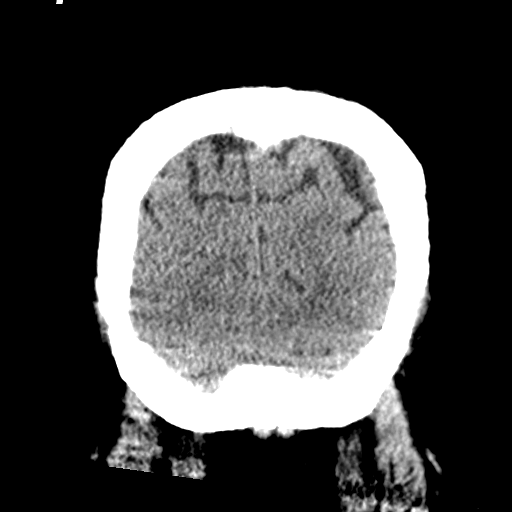
[im 30/75  brain]
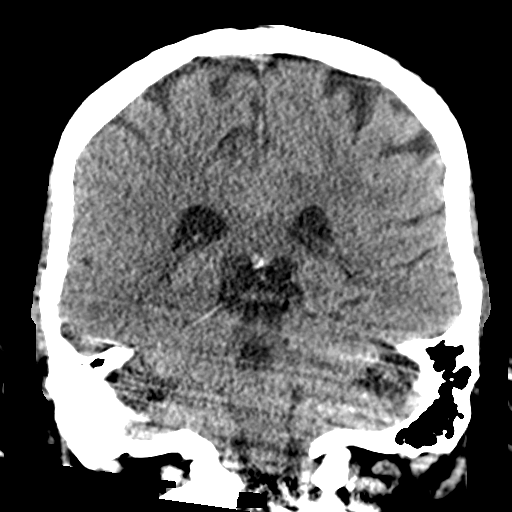
[im 45/75  brain]
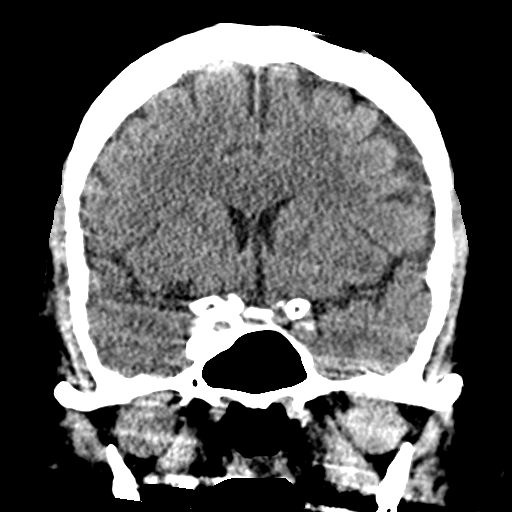

[Series 5: sagittal soft · sagittal · 0.35mm/px · 3 of 55 slices shown]
[im 27/55  brain]
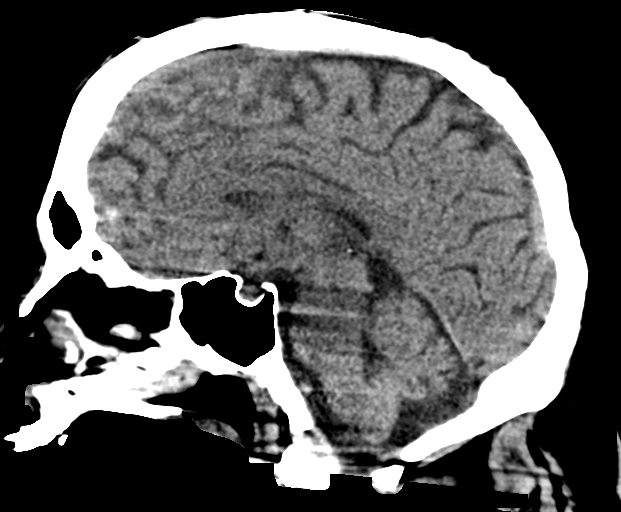
[im 34/55  brain]
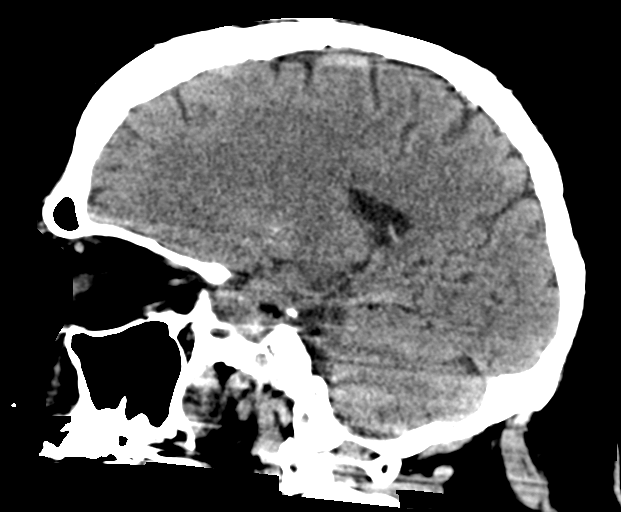
[im 41/55  brain]
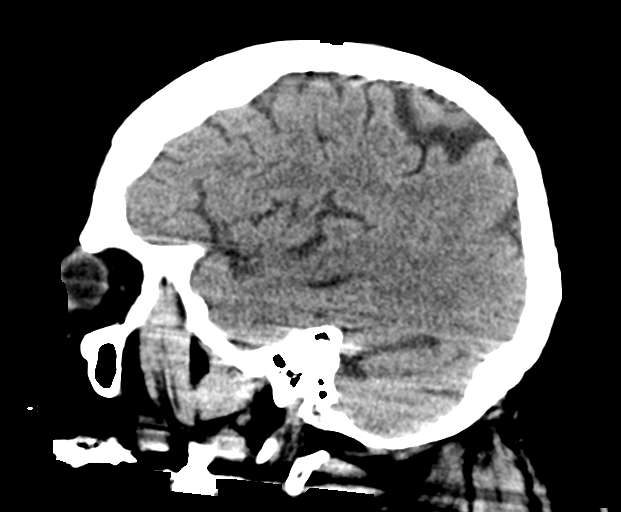

[16 of 47 positions shown; findings below may reference images not displayed]

FINDINGS: Brain: Brain volume is normal for age. No intracranial hemorrhage,
mass effect, or midline shift. No hydrocephalus. The basilar
cisterns are patent. No evidence of territorial infarct or acute
ischemia. No extra-axial or intracranial fluid collection.

Vascular: Atherosclerosis of skullbase vasculature without
hyperdense vessel or abnormal calcification. Right ICA stent,
adjacent rim calcified aneurysm, grossly stable in size from [QA]
exam.

Skull: No fracture or focal lesion.

Sinuses/Orbits: No acute findings.  Left cataract resection.

Other: None.
IMPRESSION: 1. No acute intracranial abnormality.
2. Right ICA stent, adjacent rim calcified aneurysm, grossly stable
in size from [QA] exam.

## 2021-09-10 MED ORDER — ONDANSETRON HCL 4 MG/2ML IJ SOLN
4.0000 mg | Freq: Once | INTRAMUSCULAR | Status: AC
  Administered 2021-09-10: 4 mg via INTRAVENOUS
  Filled 2021-09-10: qty 2

## 2021-09-10 MED ORDER — SODIUM CHLORIDE 0.9 % IV BOLUS
1000.0000 mL | Freq: Once | INTRAVENOUS | Status: AC
  Administered 2021-09-10: 1000 mL via INTRAVENOUS

## 2021-09-10 MED ORDER — CLONIDINE HCL 0.1 MG PO TABS
0.1000 mg | ORAL_TABLET | Freq: Once | ORAL | Status: AC
  Administered 2021-09-10: 0.1 mg via ORAL
  Filled 2021-09-10: qty 1

## 2021-09-10 MED ORDER — LORAZEPAM 2 MG/ML IJ SOLN
1.0000 mg | Freq: Once | INTRAMUSCULAR | Status: AC
  Administered 2021-09-10: 1 mg via INTRAVENOUS
  Filled 2021-09-10: qty 1

## 2021-09-10 MED ORDER — DEXMEDETOMIDINE HCL IN NACL 200 MCG/50ML IV SOLN
0.4000 ug/kg/h | INTRAVENOUS | Status: DC
  Filled 2021-09-10: qty 50

## 2021-09-10 MED ORDER — DIAZEPAM 5 MG/ML IJ SOLN
5.0000 mg | Freq: Once | INTRAMUSCULAR | Status: AC
  Administered 2021-09-10: 5 mg via INTRAVENOUS
  Filled 2021-09-10: qty 2

## 2021-09-10 MED ORDER — DEXMEDETOMIDINE HCL IN NACL 400 MCG/100ML IV SOLN
0.4000 ug/kg/h | INTRAVENOUS | Status: DC
  Administered 2021-09-10: 0.5 ug/kg/h via INTRAVENOUS
  Administered 2021-09-11: 1 ug/kg/h via INTRAVENOUS
  Administered 2021-09-11: 0.75 ug/kg/h via INTRAVENOUS
  Administered 2021-09-12 (×2): 0.6 ug/kg/h via INTRAVENOUS
  Filled 2021-09-10 (×3): qty 100
  Filled 2021-09-10: qty 200
  Filled 2021-09-10 (×4): qty 100

## 2021-09-10 NOTE — H&P (Addendum)
History and Physical  Philip Gates ACZ:[id number] DOB: [birth year] DOA: 09/10/2021  Referring physician: Jeanell Sparrow, DO PCP: Pablo Lawrence, NP  Patient coming from: Home  Chief Complaint: Allergic reaction  HPI: Philip Gates is a 66 y.o. male with medical history significant for hypertension, chronic back pain, hyperlipidemia, Asthma/COPD, GERD who presents to the emergency department due to possible allergic reaction.  Patient was unable to provide history due to being somnolent after placing him on Precedex, history was obtained from ED physician and ED medical record.  Per report, patient was chronically on opiates with last dose being this afternoon.  He was recently started on naltrexone, he took first dose this afternoon (1:30 PM), shortly after this, patient started to feel restless, agitated, and felt like he was "crawling out of his skin.  He admitted to recreational use of alcohol with last ingestion being last night.  Patient denies chest pain, fever, chills, shortness of breath, use of recreational drug.  ED Course:  In the emergency department, he was hemodynamically stable.  Work-up in the ED showed normocytic anemia, BUN/creatinine 21/1.44 (baseline creatinine at 0.8-0.9), urine drug screen was positive for opiates and amphetamine, alcohol level was negative, total CK was 156.  Influenza A, B, SARS coronavirus 2 was negative. CT of head without contrast showed no acute intracranial abnormality, clonidine 0.1 mg x 3 and several hour rounds of 1 mg lorazepam were given without much effect, patient was then placed on Precedex.  IV hydration was provided.  Hospitalist was asked to admit patient for further evaluation and management.  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   Venous insufficiency    Past Surgical History:  Procedure Laterality Date   cerebral stent     COLONOSCOPY N/A 01/02/2017   Procedure: COLONOSCOPY;  Surgeon: Rogene Houston, MD;  Location: [facility];  Service: Endoscopy;  Laterality: N/A;   dental implant  09/26/2014   upper and lower dental implant/bridges- permanent   ESOPHAGOGASTRODUODENOSCOPY N/A 01/02/2017   Procedure: ESOPHAGOGASTRODUODENOSCOPY (EGD);  Surgeon: Rogene Houston, MD;  Location: [facility];  Service: Endoscopy;  Laterality: N/A;  910   EYE SURGERY Left Jan. 16, 2017   Cataract   IR ANGIO INTRA EXTRACRAN SEL INTERNAL CAROTID BILAT MOD SED  01/20/2017   IR ANGIO VERTEBRAL SEL VERTEBRAL UNI L MOD SED  01/20/2017   JOINT REPLACEMENT Right    Knee   JOINT REPLACEMENT Left    Hip   NOSE SURGERY     RADIOLOGY WITH ANESTHESIA N/A 12/29/2014   Procedure: Embolization;  Surgeon: Consuella Lose,  MD;  Location: [facility];  Service: Radiology;  Laterality: N/A;   SUBCLAVIAN ANGIOGRAM  Nov. 8, 2016   TOTAL HIP ARTHROPLASTY Left 01/03/2013   Procedure: TOTAL HIP ARTHROPLASTY ANTERIOR APPROACH;  Surgeon: Gearlean Alf, MD;  Location: [facility];  Service: Orthopedics;  Laterality: Left;   TOTAL KNEE ARTHROPLASTY Right 10/06/2014   Procedure: RIGHT TOTAL KNEE ARTHROPLASTY;  Surgeon: Gearlean Alf, MD;  Location: [facility];  Service: Orthopedics;  Laterality: Right;   WISDOM TOOTH EXTRACTION      Social History:  reports that  he quit smoking about 3 years ago. His smoking use included cigarettes. He started smoking about 6 years ago. He smoked an average of .5 packs per day. He has quit using smokeless tobacco.  His smokeless tobacco use included snuff. He reports that he does not drink alcohol and does not use drugs.   Allergies  Allergen Reactions   Aspirin Other (See Comments)    Do not take with depakote.    Clindamycin Other (See Comments)    Abdominal pain "twisted stomach" Abdominal pain "twisted stomach"   Iodine Other (See Comments)   Erythromycin     Caused a "twisted stomach"   Penicillins Rash    Family History  Problem Relation Age of Onset   Arrhythmia Mother        Has pacemaker   Hypertension Mother    Heart disease Mother    Mental illness Mother    Varicose Veins Mother    Depression Mother    Colon cancer Father    Cancer Father        Prostate and Colon   Diabetes Father    Hypertension Father    Cancer - Colon Father    Depression Sister      Prior to Admission medications   Medication Sig Start Date End Date Taking? Authorizing Provider  Ascorbic Acid (VITAMIN C) 1000 MG tablet Take 1,000 mg by mouth in the morning and at bedtime.   Yes [provider]  aspirin EC 81 MG tablet Take 81 mg by mouth in the morning. Swallow whole.   Yes [provider]  atorvastatin (LIPITOR) 10 MG tablet Take 10 mg by mouth. Take Monday,Wednesday and Friday evening 08/16/21  Yes [provider]  benzonatate (TESSALON) 100 MG capsule Take 1 capsule (100 mg total) by mouth every 8 (eight) hours. 08/27/21  Yes Hazel Sams, PA-C  BLACK ELDERBERRY PO Take 1 capsule by mouth in the morning and at bedtime.   Yes [provider]  BREZTRI AEROSPHERE 160-9-4.8 MCG/ACT AERO Inhale 1 puff into the lungs in the morning and at bedtime. 11/16/20  Yes [provider]  cyclobenzaprine (FLEXERIL) 5 MG tablet Take 1 tablet (5 mg total) by mouth 3 (three) times daily  as needed for muscle spasms. 12/08/20  Yes Meyran, Ocie Cornfield, NP  dexlansoprazole (DEXILANT) 60 MG capsule Take 60 mg by mouth every morning.    Yes [provider]  Dutasteride-Tamsulosin HCl 0.5-0.4 MG CAPS Take 1 capsule by mouth in the morning.   Yes [provider]  famotidine (PEPCID) 20 MG tablet Take 20 mg by mouth in the morning and at bedtime. 11/06/20  Yes [provider]  Homeopathic Products (MUSCLE CRAMP COMPLEX PO) Take 1 capsule by mouth in the morning and at bedtime. Cramp Defense Magnesium for Leg Cramps, Muscle Cramps & Muscle Spasms.   Yes [provider]  HYDROcodone-acetaminophen (NORCO) 10-325 MG tablet Take 1 tablet by  mouth every 4 (four) hours as needed for moderate pain. 09/04/21 10/04/21 Yes [provider]  IBU 800 MG tablet Take 800 mg by mouth every 8 (eight) hours as needed (pain). 11/16/20  Yes [provider]  ipratropium (ATROVENT) 0.03 % nasal spray Place into both nostrils. 07/24/21  Yes [provider]  lamoTRIgine (LAMICTAL) 200 MG tablet Take 1 tablet (200 mg total) by mouth at bedtime. 08/14/21  Yes Cloria Spring, MD  levocetirizine (XYZAL) 5 MG tablet Take 5 mg by mouth at bedtime. 11/10/16  Yes [provider]  lisdexamfetamine (VYVANSE) 70 MG capsule Take 1 capsule (70 mg total) by mouth daily. 08/14/21  Yes Cloria Spring, MD  metoprolol succinate (TOPROL-XL) 50 MG 24 hr tablet Take 50 mg by mouth at bedtime. 11/16/20  Yes [provider]  Misc Natural Product Nasal (NASAL CLEANSE RINSE MIX) PACK Place 1 Dose into the nose daily. W/Azithromycin   Yes [provider]  Misc Natural Products (PROSTATE HEALTH) CAPS Take 1 capsule by mouth in the morning and at bedtime.   Yes [provider]  mometasone (ELOCON) 0.1 % cream Apply 1 application topically daily as needed (skin irritation). 10/15/20  Yes [provider]  montelukast (SINGULAIR) 10 MG tablet Take  10 mg by mouth every morning.   Yes [provider]  Multiple Vitamins-Minerals (MULTIVITAMIN WITH MINERALS) tablet Take 1 tablet by mouth daily. Multivitamin for Women (needs the iron)   Yes [provider]  PROAIR HFA 108 (90 Base) MCG/ACT inhaler Inhale 1-2 puffs into the lungs every 6 (six) hours as needed for shortness of breath or wheezing. 10/21/17  Yes [provider]  Probiotic Product (DIGESTIVE ADV MULTI-STRAIN PO) Take 1 capsule by mouth in the morning and at bedtime.   Yes [provider]  promethazine (PHENERGAN) 25 MG tablet Take 25 mg by mouth every 6 (six) hours as needed for nausea or vomiting.   Yes [provider]  tadalafil (CIALIS) 20 MG tablet Take 20 mg by mouth at bedtime as needed for erectile dysfunction.   Yes [provider]  telmisartan-hydrochlorothiazide (MICARDIS HCT) 80-12.5 MG tablet Take 1 tablet by mouth daily. 08/23/21  Yes [provider]  testosterone cypionate (DEPOTESTOTERONE CYPIONATE) 200 MG/ML injection Inject 200 mg into the muscle every 14 (fourteen) days.  12/16/14  Yes [provider]  naltrexone (DEPADE) 50 MG tablet Take 50 mg by mouth daily. 08/16/21   [provider]  OVER THE COUNTER MEDICATION Take 1 capsule by mouth in the morning. Ketones BHB    [provider]  telmisartan (MICARDIS) 40 MG tablet Take 40 mg by mouth in the morning. Patient not taking: Reported on 09/10/2021 09/14/20   [provider]    Physical Exam: BP (!) 134/92    Pulse 77    Temp 98.6 F (37 C) (Axillary)    Resp 17    Ht 5\' 10"  (1.778 m)    Wt 100.7 kg    SpO2 93%    BMI 31.85 kg/m   General: 66 y.o. year-old male somnolent, arousable but in no acute distress.   HEENT: NCAT, PERRL Neck: Supple, trachea medial Cardiovascular: Regular rate and rhythm with no rubs or gallops.  No thyromegaly or JVD noted.  No lower extremity edema. 2/4 pulses in all 4 extremities. Respiratory:  Clear to auscultation with no wheezes or rales. Good inspiratory effort. Abdomen: Soft, nontender nondistended with normal bowel sounds x4 quadrants. Muskuloskeletal: No cyanosis,  clubbing or edema noted bilaterally Neuro: This cannot be assessed at this time due to patient's current status Skin: No ulcerative lesions noted or rashes Psychiatry: This cannot be assessed at this time due to patient's current status         Labs on Admission:  Basic Metabolic Panel: Recent Labs  Lab 09/10/21 1515  NA 134*  K 3.7  CL 102  CO2 21*  GLUCOSE 90  BUN 21  CREATININE 1.44*  CALCIUM 8.8*   Liver Function Tests: Recent Labs  Lab 09/10/21 1515  AST 45*  ALT 51*  ALKPHOS 69  BILITOT 0.7  PROT 6.7  ALBUMIN 3.9   No results for input(s): LIPASE, AMYLASE in the last 168 hours. No results for input(s): AMMONIA in the last 168 hours. CBC: Recent Labs  Lab 09/10/21 1515  WBC 7.6  NEUTROABS 5.2  HGB 12.7*  HCT 37.1*  MCV 98.1  PLT 196   Cardiac Enzymes: Recent Labs  Lab 09/10/21 1515  CKTOTAL 156    BNP (last 3 results) No results for input(s): BNP in the last 8760 hours.  ProBNP (last 3 results) No results for input(s): PROBNP in the last 8760 hours.  CBG: No results for input(s): GLUCAP in the last 168 hours.  Radiological Exams on Admission: CT Head Wo Contrast  Result Date: 09/10/2021 CLINICAL DATA:  Delirium. EXAM: CT HEAD WITHOUT CONTRAST TECHNIQUE: Contiguous axial images were obtained from the base of the skull through the vertex without intravenous contrast. COMPARISON:  Head CT 09/18/2014 FINDINGS: Brain: Brain volume is normal for age. No intracranial hemorrhage, mass effect, or midline shift. No hydrocephalus. The basilar cisterns are patent. No evidence of territorial infarct or acute ischemia. No extra-axial or intracranial fluid collection. Vascular: Atherosclerosis of skullbase vasculature without hyperdense vessel or abnormal calcification. Right ICA stent,  adjacent rim calcified aneurysm, grossly stable in size from 2016 exam. Skull: No fracture or focal lesion. Sinuses/Orbits: No acute findings.  Left cataract resection. Other: None. IMPRESSION: 1. No acute intracranial abnormality. 2. Right ICA stent, adjacent rim calcified aneurysm, grossly stable in size from 2016 exam. Electronically Signed   By: Keith Rake M.D.   On: 09/10/2021 21:57    EKG: I independently viewed the EKG done and my findings are as followed: Sinus tachycardia at a rate of 117 bpm  Assessment/Plan Present on Admission:  Opiate dependence ([place])  Essential hypertension  Mixed hyperlipidemia  Asthma  Benign prostatic hyperplasia without lower urinary tract symptoms  Gastroesophageal reflux disease without esophagitis  Principal Problem:   Opiate dependence (HCC) Active Problems:   Asthma   Gastroesophageal reflux disease without esophagitis   Essential hypertension   Benign prostatic hyperplasia without lower urinary tract symptoms   Mixed hyperlipidemia   Alcohol abuse   Transaminitis   Obesity (BMI 30.0-34.9)   COPD (chronic obstructive pulmonary disease) (HCC)   Chronic back pain   Altered mental status due to multifactorial Opiate dependence/withdrawal Alcohol abuse/withdrawal Patient will be admitted to ICU Patient was agitated and did not respond to several rounds of Ativan, so he was started on IV Precedex Continue fall precaution, aspiration precaution, seizure precaution, neurochecks Monitor patient for alcohol withdrawal when Precedex is tapered off and awake and consider treating patient per CIWA protocol at that time  Transaminitis AST 45, ALT 51 Continue to monitor liver enzymes  Essential hypertension Continue Toprol-XL  Acute kidney injury  BUN/creatinine 21/1.44 (baseline creatinine at 0.8-0.9) Continue gentle hydration Renally adjust medications, avoid nephrotoxic agents/dehydration/hypotension  Asthma/COPD  Continue breztri,  Atrovent, Singulair  Chronic back pain Stable at this time  BPH Continue dutasteride-tamsulosin  GERD Continue Protonix  Obesity (BMI 31.85 kg/) Patient will be counseled on lifestyle and diet modification when more awake  DVT prophylaxis: Lovenox  Code Status: Full code  Family Communication: None at bedside  Disposition Plan:  Patient is from:                        home Anticipated DC to:                   SNF or family members home Anticipated DC date:               2-3 days Anticipated DC barriers:          Patient requires inpatient management due to severity of symptoms  Consults called: None  Admission status: Inpatient    Bernadette Hoit MD [facility]  09/11/2021, 4:17 AM

## 2021-09-10 NOTE — ED Notes (Signed)
Pt moved to room 3. Pt placed on cardiac monitor with BP to set cycle every 30 minutes. Continuous pulse oximeter applied.

## 2021-09-10 NOTE — ED Notes (Signed)
Pt placed on continuous pulse ox

## 2021-09-10 NOTE — ED Triage Notes (Signed)
Pt to the ED stating he is having an allergic reaction to his first dose of Naltrexone 50 mg taken at 1330.  Pt is yelling out during triage and is having a hard time being still.  Pt states he takes daily pain medication and last drank alcohol last night. Pt states he drank fireball last night amount unknown.

## 2021-09-10 NOTE — ED Notes (Signed)
Pt reports he took Revia today. Pt also drinks daily and takes Hydrocodone. Pt reports symptoms started today at 1330.

## 2021-09-10 NOTE — ED Provider Notes (Signed)
[facility] EMERGENCY DEPARTMENT Provider Note   CSN: [id number] Arrival date & time: 09/10/21  1501     History  Chief Complaint  Patient presents with   Allergic Reaction    Philip Gates is a 66 y.o. male.  This is a 66 y.o. male with significant medical history as below, including hyperlipidemia, asthma, chronic pain syndrome who presents to the ED with complaint of possible allergic reaction.  Patient reports that he started on naltrexone, took first dose this afternoon, around 1:30 PM this afternoon he began to experience severe akathisia, feels like he is "crawling out of his skin."  Agitation, restless. No nausea, vomiting, change in bowel or bladder function.  No fevers or chills per no chest pain or dyspnea.  No illicit drug use.    Patient has been chronically on opiates.  Last opiate use was earlier this afternoon, also patient drank alcohol last night recreationally.  Denies illicit or street drug use.   Level 5 caveat, delirium  The history is provided by the patient. No language interpreter was used.  Allergic Reaction Presenting symptoms: no difficulty swallowing and no rash   Patient Active Problem List   Diagnosis Date Noted   Inflammation of sacroiliac joint ([place]) 09/10/2021   Opiate dependence ([place]) 09/10/2021   Overweight (BMI 25.0-29.9) 08/16/2021   Sacroiliitis ([place]) 07/17/2021   Body mass index (BMI) 30.0-30.9, adult 05/29/2021   Lumbar spondylosis 02/25/2021   Mixed hyperlipidemia 02/15/2021   Asthma 02/13/2021   Elevated blood-pressure reading, without diagnosis of hypertension 12/17/2020   Body mass index (BMI) 31.0-31.9, adult 12/17/2020   Spinal stenosis, 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   Tachycardia 11/16/2020   Gastroesophageal reflux disease without esophagitis 10/15/2020   Bipolar affective disorder in remission ([place]) 10/15/2020   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   Seasonal allergies 10/15/2020   Chronic pain syndrome  10/15/2020   Mixed simple and mucopurulent chronic bronchitis ([place]) 10/15/2020   Male erectile disorder 10/15/2020   History of cerebral aneurysm repair 10/15/2020   Degenerative disc disease, cervical 10/15/2020   Benign prostatic hyperplasia without lower urinary tract symptoms 10/15/2020   Essential hypertension 06/27/2020   Body mass index (BMI) 29.0-29.9, adult 05/22/2020   Pain in joint of left shoulder 09/12/2019   Pain in joint of left elbow 09/12/2019   Pain in thumb joint with movement of left hand 09/12/2019   Osteoarthritis of carpometacarpal (CMC) joint of thumb 09/12/2019   Spinal stenosis of lumbar region without neurogenic claudication 01/19/2019   Prolapsed lumbar disc 01/19/2019   Stenosis of intervertebral foramina 10/04/2018   Cervical radiculitis 10/04/2018   Acute back pain with sciatica 07/19/2018   Lumbago with sciatica, left side 07/19/2018   Family hx of colon cancer 11/13/2016   Absolute anemia 11/13/2016   Varicose veins of right lower extremity with complications 41/74/0814   Bipolar 1 disorder, mixed, moderate ([place]) 10/12/2015   Attention deficit hyperactivity disorder (ADHD), combined type 10/12/2015   Neck pain 09/13/2015   Chronic low back pain 09/13/2015   Chronic venous insufficiency 06/25/2015   SBO (small bowel obstruction) ([place]) 01/16/2015   OA (osteoarthritis) of knee 10/06/2014   Intracranial aneurysm 09/25/2014   Facial cellulitis 05/31/2014   OA (osteoarthritis) of hip 01/03/2013       Home Medications Prior to Admission medications   Medication Sig Start Date End Date Taking? Authorizing Provider  Ascorbic Acid (VITAMIN C) 1000 MG tablet Take 1,000 mg by mouth in the morning and at bedtime.  Yes [provider]  aspirin EC 81 MG tablet Take 81 mg by mouth in the morning. Swallow whole.   Yes [provider]  atorvastatin (LIPITOR) 10 MG tablet Take 10 mg by mouth. Take Monday,Wednesday and Friday evening 08/16/21   Yes [provider]  benzonatate (TESSALON) 100 MG capsule Take 1 capsule (100 mg total) by mouth every 8 (eight) hours. 08/27/21  Yes Hazel Sams, PA-C  BLACK ELDERBERRY PO Take 1 capsule by mouth in the morning and at bedtime.   Yes [provider]  BREZTRI AEROSPHERE 160-9-4.8 MCG/ACT AERO Inhale 1 puff into the lungs in the morning and at bedtime. 11/16/20  Yes [provider]  cyclobenzaprine (FLEXERIL) 5 MG tablet Take 1 tablet (5 mg total) by mouth 3 (three) times daily as needed for muscle spasms. 12/08/20  Yes Meyran, Ocie Cornfield, NP  dexlansoprazole (DEXILANT) 60 MG capsule Take 60 mg by mouth every morning.    Yes [provider]  Dutasteride-Tamsulosin HCl 0.5-0.4 MG CAPS Take 1 capsule by mouth in the morning.   Yes [provider]  famotidine (PEPCID) 20 MG tablet Take 20 mg by mouth in the morning and at bedtime. 11/06/20  Yes [provider]  Homeopathic Products (MUSCLE CRAMP COMPLEX PO) Take 1 capsule by mouth in the morning and at bedtime. Cramp Defense Magnesium for Leg Cramps, Muscle Cramps & Muscle Spasms.   Yes [provider]  HYDROcodone-acetaminophen (NORCO) 10-325 MG tablet Take 1 tablet by mouth every 4 (four) hours as needed for moderate pain. 09/04/21 10/04/21 Yes [provider]  IBU 800 MG tablet Take 800 mg by mouth every 8 (eight) hours as needed (pain). 11/16/20  Yes [provider]  ipratropium (ATROVENT) 0.03 % nasal spray Place into both nostrils. 07/24/21  Yes [provider]  lamoTRIgine (LAMICTAL) 200 MG tablet Take 1 tablet (200 mg total) by mouth at bedtime. 08/14/21  Yes Cloria Spring, MD  levocetirizine (XYZAL) 5 MG tablet Take 5 mg by mouth at bedtime. 11/10/16  Yes [provider]  lisdexamfetamine (VYVANSE) 70 MG capsule Take 1 capsule (70 mg total) by mouth daily. 08/14/21  Yes Cloria Spring, MD  metoprolol succinate (TOPROL-XL) 50 MG 24 hr tablet Take 50  mg by mouth at bedtime. 11/16/20  Yes [provider]  Misc Natural Product Nasal (NASAL CLEANSE RINSE MIX) PACK Place 1 Dose into the nose daily. W/Azithromycin   Yes [provider]  Misc Natural Products (PROSTATE HEALTH) CAPS Take 1 capsule by mouth in the morning and at bedtime.   Yes [provider]  mometasone (ELOCON) 0.1 % cream Apply 1 application topically daily as needed (skin irritation). 10/15/20  Yes [provider]  montelukast (SINGULAIR) 10 MG tablet Take 10 mg by mouth every morning.   Yes [provider]  Multiple Vitamins-Minerals (MULTIVITAMIN WITH MINERALS) tablet Take 1 tablet by mouth daily. Multivitamin for Women (needs the iron)   Yes [provider]  PROAIR HFA 108 (90 Base) MCG/ACT inhaler Inhale 1-2 puffs into the lungs every 6 (six) hours as needed for shortness of breath or wheezing. 10/21/17  Yes [provider]  Probiotic Product (DIGESTIVE ADV MULTI-STRAIN PO) Take 1 capsule by mouth in the morning and at bedtime.   Yes [provider]  promethazine (PHENERGAN) 25 MG tablet Take 25 mg by mouth every 6 (six) hours as needed for nausea or vomiting.   Yes [provider]  tadalafil (CIALIS) 20  MG tablet Take 20 mg by mouth at bedtime as needed for erectile dysfunction.   Yes [provider]  telmisartan-hydrochlorothiazide (MICARDIS HCT) 80-12.5 MG tablet Take 1 tablet by mouth daily. 08/23/21  Yes [provider]  testosterone cypionate (DEPOTESTOTERONE CYPIONATE) 200 MG/ML injection Inject 200 mg into the muscle every 14 (fourteen) days.  12/16/14  Yes [provider]  naltrexone (DEPADE) 50 MG tablet Take 50 mg by mouth daily. 08/16/21   [provider]  OVER THE COUNTER MEDICATION Take 1 capsule by mouth in the morning. Ketones BHB    [provider]  telmisartan (MICARDIS) 40 MG tablet Take 40 mg by mouth in the morning. Patient not taking:  Reported on 09/10/2021 09/14/20   [provider]      Allergies    Aspirin, Clindamycin, Iodine, Erythromycin, and Penicillins    Review of Systems   Review of Systems  Constitutional:  Negative for chills and fever.  HENT:  Negative for facial swelling and trouble swallowing.   Eyes:  Negative for photophobia and visual disturbance.  Respiratory:  Negative for cough and shortness of breath.   Cardiovascular:  Negative for chest pain and palpitations.  Gastrointestinal:  Negative for abdominal pain, nausea and vomiting.  Endocrine: Negative for polydipsia and polyuria.  Genitourinary:  Negative for difficulty urinating and hematuria.  Musculoskeletal:  Negative for gait problem and joint swelling.  Skin:  Negative for pallor and rash.  Neurological:  Negative for syncope and headaches.  Psychiatric/Behavioral:  Positive for agitation. Negative for confusion.        Restless   Physical Exam Updated Vital Signs BP (!) 141/91    Pulse 82    Temp 98.6 F (37 C) (Axillary)    Resp 17    Ht 5\' 10"  (1.778 m)    Wt 100.7 kg    SpO2 93%    BMI 31.85 kg/m  Physical Exam Vitals and nursing note reviewed.  Constitutional:      General: He is not in acute distress.    Appearance: He is well-developed.  HENT:     Head: Normocephalic and atraumatic.     Right Ear: External ear normal.     Left Ear: External ear normal.     Mouth/Throat:     Mouth: Mucous membranes are moist.  Eyes:     General: No scleral icterus. Cardiovascular:     Rate and Rhythm: Regular rhythm. Tachycardia present.     Pulses: Normal pulses.     Heart sounds: Normal heart sounds.  Pulmonary:     Effort: Pulmonary effort is normal. No respiratory distress.     Breath sounds: Normal breath sounds.  Abdominal:     General: Abdomen is flat.     Palpations: Abdomen is soft.     Tenderness: There is no abdominal tenderness.  Musculoskeletal:        General: Normal range of motion.     Cervical back: Normal  range of motion.     Right lower leg: No edema.     Left lower leg: No edema.  Skin:    General: Skin is warm and dry.     Capillary Refill: Capillary refill takes less than 2 seconds.  Neurological:     Mental Status: He is alert and oriented to person, place, and time.  Psychiatric:        Mood and Affect: Mood is anxious. Affect is labile.        Behavior: Behavior is  agitated.    ED Results / Procedures / Treatments   Labs (all labs ordered are listed, but only abnormal results are displayed) Labs Reviewed  CBC WITH DIFFERENTIAL/PLATELET - Abnormal; Notable for the following components:      Result Value   RBC 3.78 (*)    Hemoglobin 12.7 (*)    HCT 37.1 (*)    All other components within normal limits  COMPREHENSIVE METABOLIC PANEL - Abnormal; Notable for the following components:   Sodium 134 (*)    CO2 21 (*)    Creatinine, Ser 1.44 (*)    Calcium 8.8 (*)    AST 45 (*)    ALT 51 (*)    GFR, Estimated 54 (*)    All other components within normal limits  RAPID URINE DRUG SCREEN, HOSP PERFORMED - Abnormal; Notable for the following components:   Opiates POSITIVE (*)    Amphetamines POSITIVE (*)    All other components within normal limits  RESP PANEL BY RT-PCR (FLU A&B, COVID) ARPGX2  CK  ETHANOL    EKG EKG Interpretation  Date/Time:  Tuesday September 10 2021 16:00:32 EST Ventricular Rate:  117 PR Interval:  146 QRS Duration: 88 QT Interval:  328 QTC Calculation: 457 R Axis:   19 Text Interpretation: Sinus tachycardia Otherwise normal ECG When compared with ECG of 02-Oct-2014 14:33, No significant change was found Confirmed by Wynona Dove ([id number]) on 09/11/2021 12:48:10 AM  Radiology CT Head Wo Contrast  Result Date: 09/10/2021 CLINICAL DATA:  Delirium. EXAM: CT HEAD WITHOUT CONTRAST TECHNIQUE: Contiguous axial images were obtained from the base of the skull through the vertex without intravenous contrast. COMPARISON:  Head CT 09/18/2014 FINDINGS: Brain: Brain  volume is normal for age. No intracranial hemorrhage, mass effect, or midline shift. No hydrocephalus. The basilar cisterns are patent. No evidence of territorial infarct or acute ischemia. No extra-axial or intracranial fluid collection. Vascular: Atherosclerosis of skullbase vasculature without hyperdense vessel or abnormal calcification. Right ICA stent, adjacent rim calcified aneurysm, grossly stable in size from 2016 exam. Skull: No fracture or focal lesion. Sinuses/Orbits: No acute findings.  Left cataract resection. Other: None. IMPRESSION: 1. No acute intracranial abnormality. 2. Right ICA stent, adjacent rim calcified aneurysm, grossly stable in size from 2016 exam. Electronically Signed   By: Keith Rake M.D.   On: 09/10/2021 21:57    Procedures .Critical Care Performed by: Jeanell Sparrow, DO Authorized by: Jeanell Sparrow, DO   Critical care provider statement:    Critical care time (minutes):  78   Critical care time was exclusive of:  Separately billable procedures and treating other patients   Critical care was necessary to treat or prevent imminent or life-threatening deterioration of the following conditions:  Dehydration   Critical care was time spent personally by me on the following activities:  Development of treatment plan with patient or surrogate, discussions with consultants, evaluation of patient's response to treatment, examination of patient, ordering and review of laboratory studies, ordering and review of radiographic studies, ordering and performing treatments and interventions, pulse oximetry, re-evaluation of patient's condition and review of old charts   Care discussed with: admitting provider   Comments:     Agitated delirium   Telemetry monitoring reviewed by myself, normal sinus rhythm.  Medications Ordered in ED Medications  dexmedetomidine (PRECEDEX) 400 MCG/100ML (4 mcg/mL) infusion (1.2 mcg/kg/hr  100.7 kg Intravenous Infusion Verify 09/10/21 2042)   sodium chloride 0.9 % bolus 1,000 mL (0 mLs Intravenous Stopped 09/10/21 1724)  diazepam (VALIUM) injection 5 mg (5 mg Intravenous Given 09/10/21 1553)  ondansetron (ZOFRAN) injection 4 mg (4 mg Intravenous Given 09/10/21 1555)  cloNIDine (CATAPRES) tablet 0.1 mg (0.1 mg Oral Given 09/10/21 1615)  LORazepam (ATIVAN) injection 1 mg (1 mg Intravenous Given 09/10/21 1612)  LORazepam (ATIVAN) injection 1 mg (1 mg Intravenous Given 09/10/21 1710)  cloNIDine (CATAPRES) tablet 0.1 mg (0.1 mg Oral Given 09/10/21 1710)  cloNIDine (CATAPRES) tablet 0.1 mg (0.1 mg Oral Given 09/10/21 1815)  LORazepam (ATIVAN) injection 1 mg (1 mg Intravenous Given 09/10/21 1815)    ED Course/ Medical Decision Making/ A&P                           Medical Decision Making   CC: medication reaction  This patient complains of above; this involves an extensive number of treatment options and is a complaint that carries with it a high risk of complications and morbidity. Vital signs were reviewed. Serious etiologies considered.  Record review:  Previous records obtained and reviewed   Additional history obtained from spouse  Work up as above, notable for:  Labs & imaging results that were available during my care of the patient were reviewed by me and considered in my medical decision making.   I ordered imaging studies which included CTH and I independently visualized and interpreted imaging which showed no acute process  Labs reviewed, CPK stable.  Creatinine elevated from baseline.  Concern for AKI.  Creatinine 1.44.  Baseline approximately 0.9 UDS is positive for opiates and amphetamines.  Management: Concern for possible opiate withdrawal given recent initiation of naltrexone.  Concern for possible adverse reaction to naltrexone.  Patient chronic alcohol and opiate abuse.  Patient given clonidine, benzos, multiple rounds.  Transient improvement following clonidine and benzos.  Start patient on Precedex infusion. Pt much more  comfortable/calm on precedex infusion. Recommend wean as tolerated.   CTH non-acute.  Recommend admission for agitated delirium. Pos medication reaction. Amphetamine use. Family agreeable.       This chart was dictated using voice recognition software.  Despite best efforts to proofread,  errors can occur which can change the documentation meaning. Final Clinical Impression(s) / ED Diagnoses Final diagnoses:  Agitation  Chronic prescription opiate use  Alcohol abuse  Adverse effect of drug, initial encounter  AKI (acute kidney injury) [facility])    Rx / DC Orders ED Discharge Orders     None         Jeanell Sparrow, DO 09/11/21 [id number]

## 2021-09-11 ENCOUNTER — Encounter (HOSPITAL_COMMUNITY): Payer: Self-pay | Admitting: Internal Medicine

## 2021-09-11 DIAGNOSIS — M545 Low back pain, unspecified: Secondary | ICD-10-CM

## 2021-09-11 DIAGNOSIS — E66811 Obesity, class 1: Secondary | ICD-10-CM

## 2021-09-11 DIAGNOSIS — F1010 Alcohol abuse, uncomplicated: Secondary | ICD-10-CM

## 2021-09-11 DIAGNOSIS — E669 Obesity, unspecified: Secondary | ICD-10-CM

## 2021-09-11 DIAGNOSIS — G8929 Other chronic pain: Secondary | ICD-10-CM

## 2021-09-11 DIAGNOSIS — R7401 Elevation of levels of liver transaminase levels: Secondary | ICD-10-CM

## 2021-09-11 DIAGNOSIS — R4182 Altered mental status, unspecified: Secondary | ICD-10-CM

## 2021-09-11 DIAGNOSIS — J449 Chronic obstructive pulmonary disease, unspecified: Secondary | ICD-10-CM

## 2021-09-11 LAB — COMPREHENSIVE METABOLIC PANEL
ALT: 47 U/L — ABNORMAL HIGH (ref 0–44)
AST: 38 U/L (ref 15–41)
Albumin: 3.6 g/dL (ref 3.5–5.0)
Alkaline Phosphatase: 61 U/L (ref 38–126)
Anion gap: 7 (ref 5–15)
BUN: 12 mg/dL (ref 8–23)
CO2: 22 mmol/L (ref 22–32)
Calcium: 9.1 mg/dL (ref 8.9–10.3)
Chloride: 111 mmol/L (ref 98–111)
Creatinine, Ser: 0.86 mg/dL (ref 0.61–1.24)
GFR, Estimated: >60 mL/min (ref >60)
Glucose, Bld: 117 mg/dL — ABNORMAL HIGH (ref 70–99)
Potassium: 4.1 mmol/L (ref 3.5–5.1)
Sodium: 140 mmol/L (ref 135–145)
Total Bilirubin: 0.6 mg/dL (ref 0.3–1.2)
Total Protein: 6.4 g/dL — ABNORMAL LOW (ref 6.5–8.1)

## 2021-09-11 LAB — CBC
HCT: 36.8 % — ABNORMAL LOW (ref 39.0–52.0)
Hemoglobin: 12.9 g/dL — ABNORMAL LOW (ref 13.0–17.0)
MCH: 34.2 pg — ABNORMAL HIGH (ref 26.0–34.0)
MCHC: 35.1 g/dL (ref 30.0–36.0)
MCV: 97.6 fL (ref 80.0–100.0)
Platelets: 186 10*3/uL (ref 150–400)
RBC: 3.77 MIL/uL — ABNORMAL LOW (ref 4.22–5.81)
RDW: 13.2 % (ref 11.5–15.5)
WBC: 4.8 10*3/uL (ref 4.0–10.5)
nRBC: 0 % (ref 0.0–0.2)

## 2021-09-11 LAB — HIV ANTIBODY (ROUTINE TESTING W REFLEX): HIV Screen 4th Generation wRfx: NONREACTIVE

## 2021-09-11 LAB — APTT: aPTT: 23 seconds — ABNORMAL LOW (ref 24–36)

## 2021-09-11 LAB — PHOSPHORUS: Phosphorus: 2.6 mg/dL (ref 2.5–4.6)

## 2021-09-11 LAB — VITAMIN B12: Vitamin B-12: 252 pg/mL (ref 180–914)

## 2021-09-11 LAB — MAGNESIUM: Magnesium: 2.1 mg/dL (ref 1.7–2.4)

## 2021-09-11 LAB — FOLATE: Folate: 23.9 ng/mL (ref >5.9)

## 2021-09-11 MED ORDER — MOMETASONE FURO-FORMOTEROL FUM 100-5 MCG/ACT IN AERO
2.0000 | INHALATION_SPRAY | Freq: Two times a day (BID) | RESPIRATORY_TRACT | Status: DC
  Administered 2021-09-11 – 2021-09-14 (×6): 2 via RESPIRATORY_TRACT
  Filled 2021-09-11: qty 8.8

## 2021-09-11 MED ORDER — IPRATROPIUM BROMIDE 0.03 % NA SOLN
1.0000 | Freq: Two times a day (BID) | NASAL | Status: DC
  Administered 2021-09-13 (×2): 1 via NASAL
  Filled 2021-09-11: qty 30

## 2021-09-11 MED ORDER — METOPROLOL SUCCINATE ER 50 MG PO TB24
50.0000 mg | ORAL_TABLET | Freq: Every day | ORAL | Status: DC
Start: 2021-09-11 — End: 2021-09-14
  Administered 2021-09-11 – 2021-09-13 (×3): 50 mg via ORAL
  Filled 2021-09-11 (×3): qty 1

## 2021-09-11 MED ORDER — TAMSULOSIN HCL 0.4 MG PO CAPS
0.4000 mg | ORAL_CAPSULE | Freq: Every day | ORAL | Status: DC
  Administered 2021-09-11 – 2021-09-14 (×4): 0.4 mg via ORAL
  Filled 2021-09-11 (×4): qty 1

## 2021-09-11 MED ORDER — BUDESON-GLYCOPYRROL-FORMOTEROL 160-9-4.8 MCG/ACT IN AERO
1.0000 | INHALATION_SPRAY | Freq: Two times a day (BID) | RESPIRATORY_TRACT | Status: DC
Start: 2021-09-11 — End: 2021-09-11

## 2021-09-11 MED ORDER — PANTOPRAZOLE SODIUM 40 MG PO TBEC
40.0000 mg | DELAYED_RELEASE_TABLET | Freq: Every day | ORAL | Status: DC
  Administered 2021-09-11 – 2021-09-14 (×4): 40 mg via ORAL
  Filled 2021-09-11 (×4): qty 1

## 2021-09-11 MED ORDER — GABAPENTIN 300 MG PO CAPS
300.0000 mg | ORAL_CAPSULE | Freq: Three times a day (TID) | ORAL | Status: DC
  Administered 2021-09-11 – 2021-09-14 (×10): 300 mg via ORAL
  Filled 2021-09-11 (×10): qty 1

## 2021-09-11 MED ORDER — DUTASTERIDE-TAMSULOSIN HCL 0.5-0.4 MG PO CAPS: 1.0000 | ORAL_CAPSULE | Freq: Every morning | ORAL | Status: DC

## 2021-09-11 MED ORDER — ENOXAPARIN SODIUM 40 MG/0.4ML IJ SOSY
40.0000 mg | PREFILLED_SYRINGE | INTRAMUSCULAR | Status: DC
  Administered 2021-09-11 – 2021-09-14 (×4): 40 mg via SUBCUTANEOUS
  Filled 2021-09-11 (×4): qty 0.4

## 2021-09-11 MED ORDER — TRAMADOL HCL 50 MG PO TABS
100.0000 mg | ORAL_TABLET | Freq: Once | ORAL | Status: AC
  Administered 2021-09-11: 100 mg via ORAL
  Filled 2021-09-11: qty 2

## 2021-09-11 MED ORDER — ADULT MULTIVITAMIN W/MINERALS CH
1.0000 | ORAL_TABLET | Freq: Every day | ORAL | Status: DC
  Administered 2021-09-11 – 2021-09-14 (×4): 1 via ORAL
  Filled 2021-09-11 (×4): qty 1

## 2021-09-11 MED ORDER — LAMOTRIGINE 100 MG PO TABS
200.0000 mg | ORAL_TABLET | Freq: Every day | ORAL | Status: DC
  Administered 2021-09-11 – 2021-09-13 (×3): 200 mg via ORAL
  Filled 2021-09-11 (×3): qty 2

## 2021-09-11 MED ORDER — UMECLIDINIUM BROMIDE 62.5 MCG/ACT IN AEPB
1.0000 | INHALATION_SPRAY | Freq: Every day | RESPIRATORY_TRACT | Status: DC
  Administered 2021-09-12 – 2021-09-14 (×3): 1 via RESPIRATORY_TRACT
  Filled 2021-09-11: qty 7

## 2021-09-11 MED ORDER — LORAZEPAM 1 MG PO TABS
1.0000 mg | ORAL_TABLET | ORAL | Status: DC | PRN
  Administered 2021-09-12 (×2): 2 mg via ORAL
  Administered 2021-09-13 (×2): 1 mg via ORAL
  Administered 2021-09-13: 2 mg via ORAL
  Administered 2021-09-14 (×3): 1 mg via ORAL
  Filled 2021-09-11: qty 2
  Filled 2021-09-11: qty 1
  Filled 2021-09-11: qty 2
  Filled 2021-09-11 (×3): qty 1
  Filled 2021-09-11: qty 2
  Filled 2021-09-11: qty 1

## 2021-09-11 MED ORDER — MONTELUKAST SODIUM 10 MG PO TABS
10.0000 mg | ORAL_TABLET | Freq: Every morning | ORAL | Status: DC
  Administered 2021-09-11 – 2021-09-12 (×2): 10 mg via ORAL
  Filled 2021-09-11 (×2): qty 1

## 2021-09-11 MED ORDER — CHLORHEXIDINE GLUCONATE CLOTH 2 % EX PADS
6.0000 | MEDICATED_PAD | Freq: Every day | CUTANEOUS | Status: DC
  Administered 2021-09-11 – 2021-09-13 (×3): 6 via TOPICAL

## 2021-09-11 MED ORDER — THIAMINE HCL 100 MG PO TABS
100.0000 mg | ORAL_TABLET | Freq: Every day | ORAL | Status: DC
  Administered 2021-09-11 – 2021-09-14 (×4): 100 mg via ORAL
  Filled 2021-09-11 (×4): qty 1

## 2021-09-11 MED ORDER — DUTASTERIDE 0.5 MG PO CAPS
0.5000 mg | ORAL_CAPSULE | Freq: Every day | ORAL | Status: DC
  Administered 2021-09-13 – 2021-09-14 (×2): 0.5 mg via ORAL
  Filled 2021-09-11 (×6): qty 1

## 2021-09-11 MED ORDER — SODIUM CHLORIDE 0.9 % IV SOLN: Freq: Once | INTRAVENOUS | Status: AC

## 2021-09-11 MED ORDER — LORAZEPAM 2 MG/ML IJ SOLN
1.0000 mg | INTRAMUSCULAR | Status: DC | PRN
  Administered 2021-09-11 – 2021-09-12 (×2): 2 mg via INTRAVENOUS
  Administered 2021-09-12 (×3): 4 mg via INTRAVENOUS
  Administered 2021-09-13 (×2): 2 mg via INTRAVENOUS
  Filled 2021-09-11: qty 2
  Filled 2021-09-11 (×4): qty 1
  Filled 2021-09-11 (×2): qty 2

## 2021-09-11 MED ORDER — FOLIC ACID 1 MG PO TABS
1.0000 mg | ORAL_TABLET | Freq: Every day | ORAL | Status: DC
  Administered 2021-09-11 – 2021-09-14 (×4): 1 mg via ORAL
  Filled 2021-09-11 (×4): qty 1

## 2021-09-11 NOTE — Progress Notes (Signed)
PROGRESS NOTE    Patient: Philip Gates                            PCP: Pablo Lawrence, NP                    DOB: [birth year]            DOA: 09/10/2021 [id number]:[id number]             DOS: 09/11/2021, 11:37 AM   LOS: 1 day   Date of Service: The patient was seen and examined on 09/11/2021  Subjective:   The patient was seen and examined this morning. Stable at this time.  Mental status back to baseline Still complaining of pain ....   Wife present at bedside  Brief Narrative:   Philip Gates is a 66 y.o. male with medical history significant for hypertension, chronic back pain (multiple orthopedic surgeries, knee hip back) on chronic opioids, hyperlipidemia, Asthma/COPD, GERD who presents to the emergency department due to possible allergic reaction.   somnolent after placing him on Precedex,  Patient is chronically on opiates with last dose being in the afternoon  He was recently started on naltrexone, he took first dose afternoon (1:30 PM) 09/10/20, shortly after this, patient started to feel restless, agitated, and felt like he was "crawling out of his skin.  He admitted to recreational use of alcohol with last ingestion being last night.    ED Course:  Vitals stable, work-up in the ED showed normocytic anemia, BUN/creatinine 21/1.44 (baseline creatinine at 0.8-0.9), urine drug screen was positive for opiates and amphetamine, alcohol level was negative, total CK was 156.   Influenza A, B, SARS coronavirus 2 was negative. CT of head without contrast showed no acute intracranial abnormality, clonidine 0.1 mg x 3 and several hour rounds of 1 mg lorazepam were given without much effect, patient was then placed on Precedex.  IV hydration was provided.     Assessment & Plan:   Principal Problem:   Altered mental status Active Problems:   Asthma   Gastroesophageal reflux disease without esophagitis   Essential hypertension   Benign prostatic hyperplasia without lower urinary tract  symptoms   Mixed hyperlipidemia   Opiate dependence (HCC)   Alcohol abuse   Transaminitis   Obesity (BMI 30.0-34.9)   COPD (chronic obstructive pulmonary disease) (HCC)   Chronic back pain    Altered mental status due to multifactorial Opiate dependence/withdrawal-reaction from naltrexone Alcohol abuse/withdrawal -We will continue on ICU/stepdown on Precedex drip -Initially did not respond to Ativan -Continue neurochecks,  - fall precaution, aspiration precaution, seizure precaution  Monitor patient for alcohol withdrawal when Precedex is tapered off a We will consider treating patient per CIWA protocol once stable  Transaminitis AST 45, ALT 51 Continue to monitor liver enzymes -We will continue to monitor  Essential hypertension Continue Toprol-XL -Monitoring, stable   Acute kidney injury  BUN/creatinine 21/1.44 (baseline creatinine at 0.8-0.9) -Continue IV fluid hydration,  Renally adjust medications, avoid nephrotoxic agents/dehydration/hypotension  Asthma/COPD Continue breztri, Atrovent, Singulair -Stable  Chronic back pain -Continue to complain of pain, as needed analgesics, consider Neurontin,   BPH Continue dutasteride-tamsulosin  GERD Continue Protonix   Obesity (BMI 31.85 kg/) Patient will be counseled on lifestyle and diet modification when more awake      DVT prophylaxis: Lovenox Code Status: Full code     Disposition Plan:  Patient is from:  [facility] Anticipated DC to:                   Home with home health Anticipated DC date:               2-3 days Anticipated DC barriers:          Patient requires inpatient management due to severity of symptoms   Consults called: None   Admission status: Inpatient  Level of care: ICU   ----------------------------------------------------------------------------------------------------------------------- Family Communication: Discussed with his wife at bedside The above  findings and plan of care has been discussed with patient (and family)  in detail,  they expressed understanding and agreement of above. -Advance care planning has been discussed.    Procedures:   No admission procedures for hospital encounter.    Antimicrobials:  Anti-infectives (From admission, onward)    None        Medication:   dutasteride  0.5 mg Oral Daily   And   tamsulosin  0.4 mg Oral Daily   enoxaparin (LOVENOX) injection  40 mg Subcutaneous F64P   folic acid  1 mg Oral Daily   ipratropium  1 spray Each Nare BID   lamoTRIgine  200 mg Oral QHS   metoprolol succinate  50 mg Oral QHS   mometasone-formoterol  2 puff Inhalation BID   montelukast  10 mg Oral q morning   pantoprazole  40 mg Oral Daily   thiamine  100 mg Oral Daily   umeclidinium bromide  1 puff Inhalation Daily       Objective:   Vitals:   09/11/21 0930 09/11/21 1000 09/11/21 1030 09/11/21 1100  BP: (!) 150/101 (!) 145/98 (!) 145/93 (!) 151/104  Pulse: 82 80 76 76  Resp: 15 18 15 15   Temp:      TempSrc:      SpO2: 96% 94% 95% 95%  Weight:      Height:        Intake/Output Summary (Last 24 hours) at 09/11/2021 1137 Last data filed at 09/11/2021 0010 Gross per 24 hour  Intake 11.02 ml  Output 700 ml  Net -688.98 ml   Filed Weights   09/10/21 1514  Weight: 100.7 kg     Examination:   Physical Exam  Constitution:  Alert, cooperative, no distress,  Appears calm and comfortable  Psychiatric: Normal and stable mood and affect, cognition intact,   HEENT: Normocephalic, PERRL, otherwise with in Normal limits  Chest:Chest symmetric Cardio vascular:  S1/S2, RRR, No murmure, No Rubs or Gallops  pulmonary: Clear to auscultation bilaterally, respirations unlabored, negative wheezes / crackles Abdomen: Soft, non-tender, non-distended, bowel sounds,no masses, no organomegaly Muscular skeletal: Limited exam - in bed, able to move all 4 extremities, Normal strength,  Neuro: CNII-XII intact.  , normal motor and sensation, reflexes intact  Extremities: No pitting edema lower extremities, +2 pulses  Skin: Dry, warm to touch, negative for any Rashes, No open wounds Wounds: per nursing documentation    ------------------------------------------------------------------------------------------------------------------------------------------    LABs:  CBC Latest Ref Rng & Units 09/11/2021 09/10/2021 12/08/2020  WBC 4.0 - 10.5 K/uL 4.8 7.6 13.8(H)  Hemoglobin 13.0 - 17.0 g/dL 12.9(L) 12.7(L) 14.7  Hematocrit 39.0 - 52.0 % 36.8(L) 37.1(L) 42.8  Platelets 150 - 400 K/uL 186 196 218   CMP Latest Ref Rng & Units 09/11/2021 09/10/2021 12/08/2020  Glucose 70 - 99 mg/dL 117(H) 90 106(H)  BUN 8 - 23 mg/dL 12 21 11   Creatinine 0.61 - 1.24 mg/dL 0.86 1.44(H)  0.93  Sodium 135 - 145 mmol/L 140 134(L) 134(L)  Potassium 3.5 - 5.1 mmol/L 4.1 3.7 4.1  Chloride 98 - 111 mmol/L 111 102 99  CO2 22 - 32 mmol/L 22 21(L) 25  Calcium 8.9 - 10.3 mg/dL 9.1 8.8(L) 9.9  Total Protein 6.5 - 8.1 g/dL 6.4(L) 6.7 -  Total Bilirubin 0.3 - 1.2 mg/dL 0.6 0.7 -  Alkaline Phos 38 - 126 U/L 61 69 -  AST 15 - 41 U/L 38 45(H) -  ALT 0 - 44 U/L 47(H) 51(H) -       Micro Results Recent Results (from the past 240 hour(s))  Resp Panel by RT-PCR (Flu A&B, Covid) Nasopharyngeal Swab     Status: None   Collection Time: 09/10/21  8:30 PM   Specimen: Nasopharyngeal Swab; Nasopharyngeal(NP) swabs in vial transport medium  Result Value Ref Range Status   SARS Coronavirus 2 by RT PCR NEGATIVE NEGATIVE Final    Comment: (NOTE) SARS-CoV-2 target nucleic acids are NOT DETECTED.  The SARS-CoV-2 RNA is generally detectable in upper respiratory specimens during the acute phase of infection. The lowest concentration of SARS-CoV-2 viral copies this assay can detect is 138 copies/mL. A negative result does not preclude SARS-Cov-2 infection and should not be used as the sole basis for treatment or other patient management decisions. A  negative result may occur with  improper specimen collection/handling, submission of specimen other than nasopharyngeal swab, presence of viral mutation(s) within the areas targeted by this assay, and inadequate number of viral copies(<138 copies/mL). A negative result must be combined with clinical observations, patient history, and epidemiological information. The expected result is Negative.  Fact Sheet for Patients:  EntrepreneurPulse.com.au  Fact Sheet for Healthcare Providers:  IncredibleEmployment.be  This test is no t yet approved or cleared by the [place] FDA and  has been authorized for detection and/or diagnosis of SARS-CoV-2 by FDA under an Emergency Use Authorization (EUA). This EUA will remain  in effect (meaning this test can be used) for the duration of the COVID-19 declaration under Section 564(b)(1) of the Act, 21 U.S.C.section 360bbb-3(b)(1), unless the authorization is terminated  or revoked sooner.       Influenza A by PCR NEGATIVE NEGATIVE Final   Influenza B by PCR NEGATIVE NEGATIVE Final    Comment: (NOTE) The Xpert Xpress SARS-CoV-2/FLU/RSV plus assay is intended as an aid in the diagnosis of influenza from Nasopharyngeal swab specimens and should not be used as a sole basis for treatment. Nasal washings and aspirates are unacceptable for Xpert Xpress SARS-CoV-2/FLU/RSV testing.  Fact Sheet for Patients: EntrepreneurPulse.com.au  Fact Sheet for Healthcare Providers: IncredibleEmployment.be  This test is not yet approved or cleared by the [place] FDA and has been authorized for detection and/or diagnosis of SARS-CoV-2 by FDA under an Emergency Use Authorization (EUA). This EUA will remain in effect (meaning this test can be used) for the duration of the COVID-19 declaration under Section 564(b)(1) of the Act, 21 U.S.C. section 360bbb-3(b)(1), unless the authorization  is terminated or revoked.  Performed at [facility], [address], [place], [place] [id number]     Radiology Reports CT Head Wo Contrast  Result Date: 09/10/2021 CLINICAL DATA:  Delirium. EXAM: CT HEAD WITHOUT CONTRAST TECHNIQUE: Contiguous axial images were obtained from the base of the skull through the vertex without intravenous contrast. COMPARISON:  Head CT 09/18/2014 FINDINGS: Brain: Brain volume is normal for age. No intracranial hemorrhage, mass effect, or midline shift. No hydrocephalus. The basilar cisterns  are patent. No evidence of territorial infarct or acute ischemia. No extra-axial or intracranial fluid collection. Vascular: Atherosclerosis of skullbase vasculature without hyperdense vessel or abnormal calcification. Right ICA stent, adjacent rim calcified aneurysm, grossly stable in size from 2016 exam. Skull: No fracture or focal lesion. Sinuses/Orbits: No acute findings.  Left cataract resection. Other: None. IMPRESSION: 1. No acute intracranial abnormality. 2. Right ICA stent, adjacent rim calcified aneurysm, grossly stable in size from 2016 exam. Electronically Signed   By: Keith Rake M.D.   On: 09/10/2021 21:57    SIGNED: Deatra James, MD, FHM. [facility],  Pager (please use [email or web address] to page/text) Please use Epic Secure Chat for non-urgent communication (7AM-7PM)  If 7PM-7AM, please contact night-coverage [email or web address], 09/11/2021, 11:37 AM

## 2021-09-11 NOTE — ED Notes (Signed)
Pt has been asleep during the night. Able to arouse with stimulation.

## 2021-09-11 NOTE — ED Notes (Signed)
Family took all of pts belongings with her.

## 2021-09-12 LAB — BASIC METABOLIC PANEL
Anion gap: 6 (ref 5–15)
BUN: 11 mg/dL (ref 8–23)
CO2: 24 mmol/L (ref 22–32)
Calcium: 8.7 mg/dL — ABNORMAL LOW (ref 8.9–10.3)
Chloride: 108 mmol/L (ref 98–111)
Creatinine, Ser: 0.86 mg/dL (ref 0.61–1.24)
GFR, Estimated: >60 mL/min (ref >60)
Glucose, Bld: 105 mg/dL — ABNORMAL HIGH (ref 70–99)
Potassium: 3.7 mmol/L (ref 3.5–5.1)
Sodium: 138 mmol/L (ref 135–145)

## 2021-09-12 MED ORDER — TRAMADOL HCL 50 MG PO TABS
100.0000 mg | ORAL_TABLET | Freq: Four times a day (QID) | ORAL | Status: DC | PRN
  Administered 2021-09-12 – 2021-09-14 (×4): 100 mg via ORAL
  Filled 2021-09-12 (×4): qty 2

## 2021-09-12 NOTE — Progress Notes (Signed)
PROGRESS NOTE    Patient: Philip Gates                            PCP: Pablo Lawrence, NP                    DOB: 07/08/1956            DOA: 09/10/2021 [id number]:[id number]             DOS: 09/12/2021, 11:31 AM   LOS: 2 days   Date of Service: The patient was seen and examined on 09/12/2021  Subjective:   The patient was seen and examined this morning, awake alert, mildly confused, agitated Patient was taken off Precedex, had to be restarted this morning due to increased agitation, confusion Still requesting pain medication Wife present at bedside  Brief Narrative:   KOLTER REAVER is a 66 y.o. male with medical history significant for hypertension, chronic back pain (multiple orthopedic surgeries, knee hip back) on chronic opioids, hyperlipidemia, Asthma/COPD, GERD who presents to the emergency department due to possible allergic reaction.   somnolent after placing him on Precedex,  Patient is chronically on opiates with last dose being in the afternoon  He was recently started on naltrexone, he took first dose afternoon (1:30 PM) 09/10/20, shortly after this, patient started to feel restless, agitated, and felt like he was "crawling out of his skin.  He admitted to recreational use of alcohol with last ingestion being last night.    ED Course:  Vitals stable, work-up in the ED showed normocytic anemia, BUN/creatinine 21/1.44 (baseline creatinine at 0.8-0.9), urine drug screen was positive for opiates and amphetamine, alcohol level was negative, total CK was 156.   Influenza A, B, SARS coronavirus 2 was negative. CT of head without contrast showed no acute intracranial abnormality, clonidine 0.1 mg x 3 and several hour rounds of 1 mg lorazepam were given without much effect, patient was then placed on Precedex.  IV hydration was provided.     Assessment & Plan:   Principal Problem:   Altered mental status Active Problems:   Asthma   Gastroesophageal reflux disease without esophagitis    Essential hypertension   Benign prostatic hyperplasia without lower urinary tract symptoms   Mixed hyperlipidemia   Opiate dependence (HCC)   Alcohol abuse   Transaminitis   Obesity (BMI 30.0-34.9)   COPD (chronic obstructive pulmonary disease) (HCC)   Chronic back pain    Altered mental status due to multifactorial -Mentation is waxing and waning, was taken off Precedex overnight, due to more agitation confusion this morning, Precedex was restarted  -Remains tachycardic, with mild tachypnea overnight blood pressure stable, satting 94% on room air  Opiate dependence/withdrawal-reaction from naltrexone Alcohol abuse/withdrawal -We will continue monitoring the patient in ICU on Precedex drip -Initially did not respond to Ativan -Continue neurochecks,  - fall precaution, aspiration precaution, seizure precaution  -Whenever patient is successfully tapered off Precedex we will initiate CIWA protocol    Transaminitis Monitoring LFTs-improving   Essential hypertension Continue Toprol-XL -Remained stable   Acute kidney injury -Much improved - BUN/creatinine 21/1.44 (baseline creatinine at 0.8-0.9)>> creatinine 0.86 -Status post IV fluid resuscitation Renally adjust medications, avoid nephrotoxic agents/dehydration/hypotension  Asthma/COPD Continue breztri, Atrovent, Singulair -Stable on room air  Chronic back pain -Continue to complain of pain, as needed analgesics,  -Initiated gabapentin, titrating dose -As needed Ultram   BPH Continue dutasteride-tamsulosin  GERD Continue Protonix  Obesity (BMI 31.85 kg/) Patient will be counseled on lifestyle and diet modification when more awake      DVT prophylaxis: Lovenox Code Status: Full code     Disposition Plan:  Patient is from:                        [facility] Anticipated DC to:                   Home with home health Anticipated DC date:               2-3 days Anticipated DC barriers:          Patient  requires inpatient management due to severity of symptoms   Consults called: None   Admission status: Inpatient  Level of care: ICU   ----------------------------------------------------------------------------------------------------------------------- Family Communication: Discussed with his wife at bedside The above findings and plan of care has been discussed with patient (and family)  in detail,  they expressed understanding and agreement of above. -Advance care planning has been discussed.    Procedures:   No admission procedures for hospital encounter.    Antimicrobials:  Anti-infectives (From admission, onward)    None        Medication:   Chlorhexidine Gluconate Cloth  6 each Topical Daily   dutasteride  0.5 mg Oral Daily   And   tamsulosin  0.4 mg Oral Daily   enoxaparin (LOVENOX) injection  40 mg Subcutaneous J67H   folic acid  1 mg Oral Daily   gabapentin  300 mg Oral TID   ipratropium  1 spray Each Nare BID   lamoTRIgine  200 mg Oral QHS   metoprolol succinate  50 mg Oral QHS   mometasone-formoterol  2 puff Inhalation BID   montelukast  10 mg Oral q morning   multivitamin with minerals  1 tablet Oral Daily   pantoprazole  40 mg Oral Daily   thiamine  100 mg Oral Daily   umeclidinium bromide  1 puff Inhalation Daily       Objective:   Vitals:   09/12/21 0747 09/12/21 0748 09/12/21 0800 09/12/21 1059  BP:   132/70 136/82  Pulse:   88 (!) 101  Resp:   17   Temp:  (!) 97.4 F (36.3 C)    TempSrc:  Axillary    SpO2: 95%  91% 94%  Weight:      Height:        Intake/Output Summary (Last 24 hours) at 09/12/2021 1131 Last data filed at 09/12/2021 1031 Gross per 24 hour  Intake 659.55 ml  Output 450 ml  Net 209.55 ml   Filed Weights   09/10/21 1514 09/11/21 1500 09/12/21 0500  Weight: 100.7 kg 98.5 kg 98.5 kg     Examination:     General:  Alert, oriented, cooperative, agitated-mildly confused  HEENT:  Normocephalic, PERRL, otherwise  with in Normal limits   Neuro:  CNII-XII intact. , normal motor and sensation, reflexes intact   Lungs:   Clear to auscultation BL, Respirations unlabored, no wheezes / crackles  Cardio:    S1/S2, RRR, No murmure, No Rubs or Gallops   Abdomen:   Soft, non-tender, bowel sounds active all four quadrants,  no guarding or peritoneal signs.  Muscular skeletal:  Limited exam - in bed, able to move all 4 extremities, Normal strength,  2+ pulses,  symmetric, No pitting edema  Skin:  Dry, warm to touch, negative for any  Rashes,  Wounds: Please see nursing documentation         ------------------------------------------------------------------------------------------------------------------------------------------    LABs:  CBC Latest Ref Rng & Units 09/11/2021 09/10/2021 12/08/2020  WBC 4.0 - 10.5 K/uL 4.8 7.6 13.8(H)  Hemoglobin 13.0 - 17.0 g/dL 12.9(L) 12.7(L) 14.7  Hematocrit 39.0 - 52.0 % 36.8(L) 37.1(L) 42.8  Platelets 150 - 400 K/uL 186 196 218   CMP Latest Ref Rng & Units 09/12/2021 09/11/2021 09/10/2021  Glucose 70 - 99 mg/dL 105(H) 117(H) 90  BUN 8 - 23 mg/dL 11 12 21   Creatinine 0.61 - 1.24 mg/dL 0.86 0.86 1.44(H)  Sodium 135 - 145 mmol/L 138 140 134(L)  Potassium 3.5 - 5.1 mmol/L 3.7 4.1 3.7  Chloride 98 - 111 mmol/L 108 111 102  CO2 22 - 32 mmol/L 24 22 21(L)  Calcium 8.9 - 10.3 mg/dL 8.7(L) 9.1 8.8(L)  Total Protein 6.5 - 8.1 g/dL - 6.4(L) 6.7  Total Bilirubin 0.3 - 1.2 mg/dL - 0.6 0.7  Alkaline Phos 38 - 126 U/L - 61 69  AST 15 - 41 U/L - 38 45(H)  ALT 0 - 44 U/L - 47(H) 51(H)       Micro Results Recent Results (from the past 240 hour(s))  Resp Panel by RT-PCR (Flu A&B, Covid) Nasopharyngeal Swab     Status: None   Collection Time: 09/10/21  8:30 PM   Specimen: Nasopharyngeal Swab; Nasopharyngeal(NP) swabs in vial transport medium  Result Value Ref Range Status   SARS Coronavirus 2 by RT PCR NEGATIVE NEGATIVE Final    Comment: (NOTE) SARS-CoV-2 target nucleic acids are NOT  DETECTED.  The SARS-CoV-2 RNA is generally detectable in upper respiratory specimens during the acute phase of infection. The lowest concentration of SARS-CoV-2 viral copies this assay can detect is 138 copies/mL. A negative result does not preclude SARS-Cov-2 infection and should not be used as the sole basis for treatment or other patient management decisions. A negative result may occur with  improper specimen collection/handling, submission of specimen other than nasopharyngeal swab, presence of viral mutation(s) within the areas targeted by this assay, and inadequate number of viral copies(<138 copies/mL). A negative result must be combined with clinical observations, patient history, and epidemiological information. The expected result is Negative.  Fact Sheet for Patients:  EntrepreneurPulse.com.au  Fact Sheet for Healthcare Providers:  IncredibleEmployment.be  This test is no t yet approved or cleared by the [place] FDA and  has been authorized for detection and/or diagnosis of SARS-CoV-2 by FDA under an Emergency Use Authorization (EUA). This EUA will remain  in effect (meaning this test can be used) for the duration of the COVID-19 declaration under Section 564(b)(1) of the Act, 21 U.S.C.section 360bbb-3(b)(1), unless the authorization is terminated  or revoked sooner.       Influenza A by PCR NEGATIVE NEGATIVE Final   Influenza B by PCR NEGATIVE NEGATIVE Final    Comment: (NOTE) The Xpert Xpress SARS-CoV-2/FLU/RSV plus assay is intended as an aid in the diagnosis of influenza from Nasopharyngeal swab specimens and should not be used as a sole basis for treatment. Nasal washings and aspirates are unacceptable for Xpert Xpress SARS-CoV-2/FLU/RSV testing.  Fact Sheet for Patients: EntrepreneurPulse.com.au  Fact Sheet for Healthcare Providers: IncredibleEmployment.be  This test is not yet  approved or cleared by the [place] FDA and has been authorized for detection and/or diagnosis of SARS-CoV-2 by FDA under an Emergency Use Authorization (EUA). This EUA will remain in effect (meaning this test can be used)  for the duration of the COVID-19 declaration under Section 564(b)(1) of the Act, 21 U.S.C. section 360bbb-3(b)(1), unless the authorization is terminated or revoked.  Performed at [facility], [address], [place], [place] [address]     Radiology Reports CT Head Wo Contrast  Result Date: 09/10/2021 CLINICAL DATA:  Delirium. EXAM: CT HEAD WITHOUT CONTRAST TECHNIQUE: Contiguous axial images were obtained from the base of the skull through the vertex without intravenous contrast. COMPARISON:  Head CT 09/18/2014 FINDINGS: Brain: Brain volume is normal for age. No intracranial hemorrhage, mass effect, or midline shift. No hydrocephalus. The basilar cisterns are patent. No evidence of territorial infarct or acute ischemia. No extra-axial or intracranial fluid collection. Vascular: Atherosclerosis of skullbase vasculature without hyperdense vessel or abnormal calcification. Right ICA stent, adjacent rim calcified aneurysm, grossly stable in size from 2016 exam. Skull: No fracture or focal lesion. Sinuses/Orbits: No acute findings.  Left cataract resection. Other: None. IMPRESSION: 1. No acute intracranial abnormality. 2. Right ICA stent, adjacent rim calcified aneurysm, grossly stable in size from 2016 exam. Electronically Signed   By: Keith Rake M.D.   On: 09/10/2021 21:57    SIGNED: Deatra James, MD, FHM. [facility],  Pager (please use [email or web address] to page/text) Please use Epic Secure Chat for non-urgent communication (7AM-7PM)  > 66 min of critical time was spent seeing evaluate the patient, reviewing all medical records, labs, reinitiating Precedex drip in ICU setting Discussing care with ICU nursing staff, patient and his wife at bedside  If 7PM-7AM,  please contact night-coverage [email or web address], 09/12/2021, 11:31 AM

## 2021-09-12 NOTE — Evaluation (Signed)
Physical Therapy Evaluation Patient Details Name: Philip Gates MRN: [id number] DOB: 05/08/1956 Today's Date: 09/12/2021  History of Present Illness  Philip Gates is a 66 y.o. male with medical history significant for hypertension, chronic back pain, hyperlipidemia, Asthma/COPD, GERD who presents to the emergency department due to possible allergic reaction.  Patient was unable to provide history due to being somnolent after placing him on Precedex, history was obtained from ED physician and ED medical record.  Per report, patient was chronically on opiates with last dose being this afternoon.  He was recently started on naltrexone, he took first dose this afternoon (1:30 PM), shortly after this, patient started to feel restless, agitated, and felt like he was "crawling out of his skin.  He admitted to recreational use of alcohol with last ingestion being last night.  Patient denies chest pain, fever, chills, shortness of breath, use of recreational drug.   Clinical Impression  Patient functioning near baseline for functional mobility and gait other than slightly labored movement for transfers and ambulation.  Patient demonstrates good return for ambulation in room and hallways without use of AD and no loss of balance.  Patient encouraged to ambulate ad lib in room and with nursing staff in hallways.  Plan:  Patient discharged from physical therapy to care of nursing for ambulation daily as tolerated for length of stay.         Recommendations for follow up therapy are one component of a multi-disciplinary discharge planning process, led by the attending physician.  Recommendations may be updated based on patient status, additional functional criteria and insurance authorization.  Follow Up Recommendations No PT follow up    Assistance Recommended at Discharge PRN  Patient can return home with the following  Assistance with cooking/housework;A little help with walking and/or transfers     Equipment Recommendations None recommended by PT  Recommendations for Other Services       Functional Status Assessment Patient has not had a recent decline in their functional status     Precautions / Restrictions Precautions Precautions: None Restrictions Weight Bearing Restrictions: No      Mobility  Bed Mobility Overal bed mobility: Modified Independent                  Transfers Overall transfer level: Modified independent                      Ambulation/Gait Ambulation/Gait assistance: Modified independent (Device/Increase time) Gait Distance (Feet): 200 Feet Assistive device: None Gait Pattern/deviations: WFL(Within Functional Limits) Gait velocity: decreased     General Gait Details: grossly WFL with slightly labored cadence, good return for ambulation in room and hallways without loss of balance  Stairs            Wheelchair Mobility    Modified Rankin (Stroke Patients Only)       Balance Overall balance assessment: Mild deficits observed, not formally tested                                           Pertinent Vitals/Pain Pain Assessment: Faces Faces Pain Scale: Hurts little more Pain Location: low back Pain Descriptors / Indicators: Sore;Aching Pain Intervention(s): Limited activity within patient's tolerance;Monitored during session;Repositioned;Premedicated before session    Home Living Family/patient expects to be discharged to:: Private residence Living Arrangements: Spouse/significant other Available Help at Discharge: Family;Available  24 hours/day Type of Home: Mobile home Home Access: Stairs to enter Entrance Stairs-Rails: None Entrance Stairs-Number of Steps: 1   Home Layout: One level Home Equipment: Conservation officer, nature (2 wheels);BSC/3in1;Shower seat - built in;Cane Soil scientist (4 wheels);Hand held shower head      Prior Function Prior Level of Function : Independent/Modified Independent              Mobility Comments: Hydrographic surveyor, drives ADLs Comments: Independent     Hand Dominance   Dominant Hand: Right    Extremity/Trunk Assessment   Upper Extremity Assessment Upper Extremity Assessment: Overall WFL for tasks assessed    Lower Extremity Assessment Lower Extremity Assessment: Overall WFL for tasks assessed    Cervical / Trunk Assessment Cervical / Trunk Assessment: Other exceptions Cervical / Trunk Exceptions: neck tilted laterally to the right  Communication   Communication: No difficulties  Cognition Arousal/Alertness: Awake/alert Behavior During Therapy: WFL for tasks assessed/performed Overall Cognitive Status: Within Functional Limits for tasks assessed                                          General Comments      Exercises     Assessment/Plan    PT Assessment Patient does not need any further PT services  PT Problem List         PT Treatment Interventions      PT Goals (Current goals can be found in the Care Plan section)  Acute Rehab PT Goals Patient Stated Goal: return home with family to assist PT Goal Formulation: With patient Time For Goal Achievement: 09/12/21 Potential to Achieve Goals: Good    Frequency       Co-evaluation               AM-PAC PT "6 Clicks" Mobility  Outcome Measure Help needed turning from your back to your side while in a flat bed without using bedrails?: None Help needed moving from lying on your back to sitting on the side of a flat bed without using bedrails?: None Help needed moving to and from a bed to a chair (including a wheelchair)?: None Help needed standing up from a chair using your arms (e.g., wheelchair or bedside chair)?: None Help needed to walk in hospital room?: None Help needed climbing 3-5 steps with a railing? : None 6 Click Score: 24    End of Session   Activity Tolerance: Patient tolerated treatment well Patient left: in chair;with call  bell/phone within reach Nurse Communication: Mobility status PT Visit Diagnosis: Unsteadiness on feet (R26.81);Other abnormalities of gait and mobility (R26.89);Muscle weakness (generalized) (M62.81)    Time: 3832-9191 PT Time Calculation (min) (ACUTE ONLY): 30 min   Charges:   PT Evaluation $PT Eval Moderate Complexity: 1 Mod PT Treatments $Therapeutic Activity: 23-37 mins        2:56 PM, 09/12/21 Lonell Grandchild, MPT Physical Therapist with [facility] [phone] office [phone] mobile phone

## 2021-09-13 LAB — BASIC METABOLIC PANEL
Anion gap: 8 (ref 5–15)
BUN: 8 mg/dL (ref 8–23)
CO2: 26 mmol/L (ref 22–32)
Calcium: 8.8 mg/dL — ABNORMAL LOW (ref 8.9–10.3)
Chloride: 106 mmol/L (ref 98–111)
Creatinine, Ser: 0.86 mg/dL (ref 0.61–1.24)
GFR, Estimated: >60 mL/min (ref >60)
Glucose, Bld: 115 mg/dL — ABNORMAL HIGH (ref 70–99)
Potassium: 3 mmol/L — ABNORMAL LOW (ref 3.5–5.1)
Sodium: 140 mmol/L (ref 135–145)

## 2021-09-13 MED ORDER — POTASSIUM CHLORIDE CRYS ER 20 MEQ PO TBCR
40.0000 meq | EXTENDED_RELEASE_TABLET | Freq: Once | ORAL | Status: AC
  Administered 2021-09-13: 40 meq via ORAL
  Filled 2021-09-13: qty 2

## 2021-09-13 NOTE — TOC Initial Note (Signed)
Transition of Care [facility]) - Initial/Assessment Note    Patient Details  Name: Philip Gates MRN: [id number] Date of Birth: 04/26/1956  Transition of Care [facility]) CM/SW Contact:    Shade Flood, LCSW Phone Number: 09/13/2021, 3:15 PM  Clinical Narrative:                  Received [facility] consult for SA Treatment resources. Resource information was left for pt in his room. TOC will be available if any additional needs arise.  Expected Discharge Plan: Home/Self Care Barriers to Discharge: Continued Medical Work up   Patient Goals and CMS Choice Patient states their goals for this hospitalization and ongoing recovery are:: go home      Expected Discharge Plan and Services Expected Discharge Plan: Home/Self Care In-house Referral: Clinical Social Work     Living arrangements for the past 2 months: Single Family Home                                      Prior Living Arrangements/Services Living arrangements for the past 2 months: Single Family Home Lives with:: Spouse Patient language and need for interpreter reviewed:: Yes Do you feel safe going back to the place where you live?: Yes      Need for Family Participation in Patient Care: No (Comment) Care giver support system in place?: Yes (comment)   Criminal Activity/Legal Involvement Pertinent to Current Situation/Hospitalization: No - Comment as needed  Activities of Daily Living Home Assistive Devices/Equipment: None ADL Screening (condition at time of admission) Patient's cognitive ability adequate to safely complete daily activities?: No Is the patient deaf or have difficulty hearing?: No Does the patient have difficulty seeing, even when wearing glasses/contacts?: No Does the patient have difficulty concentrating, remembering, or making decisions?: No Patient able to express need for assistance with ADLs?: No Does the patient have difficulty dressing or bathing?: No Independently performs ADLs?: Yes  (appropriate for developmental age) Does the patient have difficulty walking or climbing stairs?: No Weakness of Legs: None Weakness of Arms/Hands: None  Permission Sought/Granted                  Emotional Assessment       Orientation: : Oriented to Self, Oriented to Place, Oriented to  Time, Oriented to Situation Alcohol / Substance Use: Not Applicable Psych Involvement: No (comment)  Admission diagnosis:  Alcohol abuse [F10.10] Opiate dependence ([place]) [F11.20] Agitation [R45.1] AKI (acute kidney injury) ([place]) [N17.9] Chronic prescription opiate use [Z79.891] Adverse effect of drug, initial encounter [T50.905A] Patient Active Problem List   Diagnosis Date Noted   Alcohol abuse 09/11/2021   Transaminitis 09/11/2021   Obesity (BMI 30.0-34.9) 09/11/2021   COPD (chronic obstructive pulmonary disease) ([place]) 09/11/2021   Chronic back pain 09/11/2021   Altered mental status 09/11/2021   Inflammation of sacroiliac joint ([place]) 09/10/2021   Opiate dependence ([place]) 09/10/2021   Overweight (BMI 25.0-29.9) 08/16/2021   Sacroiliitis ([place]) 07/17/2021   Body mass index (BMI) 30.0-30.9, adult 05/29/2021   Lumbar spondylosis 02/25/2021   Mixed hyperlipidemia 02/15/2021   Asthma 02/13/2021   Elevated blood-pressure reading, without diagnosis of hypertension 12/17/2020   Body mass index (BMI) 31.0-31.9, adult 12/17/2020   Spinal stenosis, 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   Tachycardia 11/16/2020   Gastroesophageal reflux disease without esophagitis 10/15/2020   Bipolar affective disorder in remission (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  Seasonal allergies 10/15/2020   Chronic pain syndrome 10/15/2020   Mixed simple and mucopurulent chronic bronchitis ([place]) 10/15/2020   Male erectile disorder 10/15/2020   History of cerebral aneurysm repair 10/15/2020   Degenerative disc disease, cervical 10/15/2020   Benign prostatic hyperplasia without  lower urinary tract symptoms 10/15/2020   Essential hypertension 06/27/2020   Body mass index (BMI) 29.0-29.9, adult 05/22/2020   Pain in joint of left shoulder 09/12/2019   Pain in joint of left elbow 09/12/2019   Pain in thumb joint with movement of left hand 09/12/2019   Osteoarthritis of carpometacarpal (CMC) joint of thumb 09/12/2019   Spinal stenosis of lumbar region without neurogenic claudication 01/19/2019   Prolapsed lumbar disc 01/19/2019   Stenosis of intervertebral foramina 10/04/2018   Cervical radiculitis 10/04/2018   Acute back pain with sciatica 07/19/2018   Lumbago with sciatica, left side 07/19/2018   Family hx of colon cancer 11/13/2016   Absolute anemia 11/13/2016   Varicose veins of right lower extremity with complications 36/14/4315   Bipolar 1 disorder, mixed, moderate ([place]) 10/12/2015   Attention deficit hyperactivity disorder (ADHD), combined type 10/12/2015   Neck pain 09/13/2015   Chronic low back pain 09/13/2015   Chronic venous insufficiency 06/25/2015   SBO (small bowel obstruction) ([place]) 01/16/2015   OA (osteoarthritis) of knee 10/06/2014   Intracranial aneurysm 09/25/2014   Facial cellulitis 05/31/2014   OA (osteoarthritis) of hip 01/03/2013   PCP:  Pablo Lawrence, NP Pharmacy:   [facility] [place] [address] [place] [address] Phone: [phone] Fax: [phone]     Social Determinants of Health (SDOH) Interventions    Readmission Risk Interventions Readmission Risk Prevention Plan 09/13/2021  Transportation Screening Complete  Palliative Care Screening Not Applicable  Medication Review (RN Care Manager) Complete  Some recent data might be hidden

## 2021-09-13 NOTE — Progress Notes (Addendum)
PROGRESS NOTE    Patient: Philip Gates                            PCP: Pablo Lawrence, NP                    DOB: 30-Nov-1955            DOA: 09/10/2021 [id number]             DOS: 09/13/2021, 11:21 AM   LOS: 3 days   Date of Service: The patient was seen and examined on 09/13/2021  Subjective:   The patient was seen and examined this morning, stable still complaining of back pain, mentation is much improved no issues overnight.   Brief Narrative:   Philip Gates is a 66 y.o. male with medical history significant for hypertension, chronic back pain (multiple orthopedic surgeries, knee hip back) on chronic opioids, hyperlipidemia, Asthma/COPD, GERD who presents to the emergency department due to possible allergic reaction.   somnolent after placing him on Precedex,  Patient is chronically on opiates with last dose being in the afternoon  He was recently started on naltrexone, he took first dose afternoon (1:30 PM) 09/10/20, shortly after this, patient started to feel restless, agitated, and felt like he was "crawling out of his skin.  He admitted to recreational use of alcohol with last ingestion being last night.    ED Course:  Vitals stable, work-up in the ED showed normocytic anemia, BUN/creatinine 21/1.44 (baseline creatinine at 0.8-0.9), urine drug screen was positive for opiates and amphetamine, alcohol level was negative, total CK was 156.   Influenza A, B, SARS coronavirus 2 was negative. CT of head without contrast showed no acute intracranial abnormality, clonidine 0.1 mg x 3 and several hour rounds of 1 mg lorazepam were given without much effect, patient was then placed on Precedex.  IV hydration was provided.     Assessment & Plan:   Principal Problem:   Altered mental status Active Problems:   Asthma   Gastroesophageal reflux disease without esophagitis   Essential hypertension   Benign prostatic hyperplasia without lower urinary tract symptoms   Mixed  hyperlipidemia   Opiate dependence (HCC)   Alcohol abuse   Transaminitis   Obesity (BMI 30.0-34.9)   COPD (chronic obstructive pulmonary disease) (HCC)   Chronic back pain    Altered mental status due to multifactorial -Off Precedex, mentation has improved, back to baseline -Hemodynamically fairly stable, elevated blood pressure mildly tachypneic  Opiate dependence/withdrawal-reaction from naltrexone Alcohol abuse/withdrawal -Weaned off Precedex, resuming CIWA protocol  -Continue neurochecks,  - fall precaution, aspiration precaution, seizure precaution  -Whenever patient is successfully tapered off Precedex we will initiate CIWA protocol    Hypokalemia  -Repleting orally  Transaminitis Monitoring LFTs-improved   Essential hypertension Continue Toprol-XL -Remained stable   Acute kidney injury -Resolved - BUN/creatinine 21/1.44 (baseline creatinine at 0.8-0.9)>> creatinine 0.86 -Status post IV fluid resuscitation Renally adjust medications, avoid nephrotoxic agents/dehydration/hypotension  Asthma/COPD Continue breztri, Atrovent, Singulair -Off supplemental oxygen, satting 96% on room air  Chronic back pain -Extensive history of chronic back pain, surgeries  -continue to complain of pain, as needed analgesics,  -Initiated gabapentin, titrating dose -As needed Ultram   BPH Continue dutasteride-tamsulosin  GERD Continue Protonix   Obesity (BMI 31.85 kg/) Patient will be counseled on lifestyle and diet modification when more awake      DVT prophylaxis: Lovenox Code Status: Full  code     Disposition Plan:  Patient is from:                        [place] Anticipated DC to:                   Home with home health Anticipated DC date:               1 days Anticipated DC barriers:          Patient requires inpatient management due to severity of symptoms   Consults called: None   Admission status: Inpatient  Level of care:  ICU   ----------------------------------------------------------------------------------------------------------------------- Family Communication: Discussed with his wife at bedside The above findings and plan of care has been discussed with patient (and family)  in detail,  they expressed understanding and agreement of above. -Advance care planning has been discussed.    Procedures:   No admission procedures for hospital encounter.    Antimicrobials:  Anti-infectives (From admission, onward)    None        Medication:   Chlorhexidine Gluconate Cloth  6 each Topical Daily   dutasteride  0.5 mg Oral Daily   And   tamsulosin  0.4 mg Oral Daily   enoxaparin (LOVENOX) injection  40 mg Subcutaneous J03P   folic acid  1 mg Oral Daily   gabapentin  300 mg Oral TID   ipratropium  1 spray Each Nare BID   lamoTRIgine  200 mg Oral QHS   metoprolol succinate  50 mg Oral QHS   mometasone-formoterol  2 puff Inhalation BID   montelukast  10 mg Oral q morning   multivitamin with minerals  1 tablet Oral Daily   pantoprazole  40 mg Oral Daily   thiamine  100 mg Oral Daily   umeclidinium bromide  1 puff Inhalation Daily       Objective:   Vitals:   09/13/21 0500 09/13/21 0700 09/13/21 0845 09/13/21 1100  BP:      Pulse:      Resp:      Temp:  98.4 F (36.9 C)  98.5 F (36.9 C)  TempSrc:  Oral  Oral  SpO2:   96%   Weight: 99.7 kg     Height:        Intake/Output Summary (Last 24 hours) at 09/13/2021 1121 Last data filed at 09/13/2021 0630 Gross per 24 hour  Intake --  Output 1250 ml  Net -1250 ml   Filed Weights   09/11/21 1500 09/12/21 0500 09/13/21 0500  Weight: 98.5 kg 98.5 kg 99.7 kg     Examination:     General:  Alert, oriented, cooperative, no distress;   HEENT:  Normocephalic, PERRL, otherwise with in Normal limits   Neuro:  CNII-XII intact. , normal motor and sensation, reflexes intact   Lungs:   Clear to auscultation BL, Respirations unlabored, no  wheezes / crackles  Cardio:    S1/S2, RRR, No murmure, No Rubs or Gallops   Abdomen:   Soft, non-tender, bowel sounds active all four quadrants,  no guarding or peritoneal signs.  Muscular skeletal:  Limited exam - in bed, able to move all 4 extremities, Normal strength,  2+ pulses,  symmetric, No pitting edema  Skin:  Dry, warm to touch, negative for any Rashes,  Wounds: Please see nursing documentation     ----------------------------------------------------------------------------------------------------------------------    LABs:  CBC Latest Ref Rng & Units 09/11/2021 09/10/2021  12/08/2020  WBC 4.0 - 10.5 K/uL 4.8 7.6 13.8(H)  Hemoglobin 13.0 - 17.0 g/dL 12.9(L) 12.7(L) 14.7  Hematocrit 39.0 - 52.0 % 36.8(L) 37.1(L) 42.8  Platelets 150 - 400 K/uL 186 196 218   CMP Latest Ref Rng & Units 09/13/2021 09/12/2021 09/11/2021  Glucose 70 - 99 mg/dL 115(H) 105(H) 117(H)  BUN 8 - 23 mg/dL 8 11 12   Creatinine 0.61 - 1.24 mg/dL 0.86 0.86 0.86  Sodium 135 - 145 mmol/L 140 138 140  Potassium 3.5 - 5.1 mmol/L 3.0(L) 3.7 4.1  Chloride 98 - 111 mmol/L 106 108 111  CO2 22 - 32 mmol/L 26 24 22   Calcium 8.9 - 10.3 mg/dL 8.8(L) 8.7(L) 9.1  Total Protein 6.5 - 8.1 g/dL - - 6.4(L)  Total Bilirubin 0.3 - 1.2 mg/dL - - 0.6  Alkaline Phos 38 - 126 U/L - - 61  AST 15 - 41 U/L - - 38  ALT 0 - 44 U/L - - 47(H)       Micro Results Recent Results (from the past 240 hour(s))  Resp Panel by RT-PCR (Flu A&B, Covid) Nasopharyngeal Swab     Status: None   Collection Time: 09/10/21  8:30 PM   Specimen: Nasopharyngeal Swab; Nasopharyngeal(NP) swabs in vial transport medium  Result Value Ref Range Status   SARS Coronavirus 2 by RT PCR NEGATIVE NEGATIVE Final    Comment: (NOTE) SARS-CoV-2 target nucleic acids are NOT DETECTED.  The SARS-CoV-2 RNA is generally detectable in upper respiratory specimens during the acute phase of infection. The lowest concentration of SARS-CoV-2 viral copies this assay can detect  is 138 copies/mL. A negative result does not preclude SARS-Cov-2 infection and should not be used as the sole basis for treatment or other patient management decisions. A negative result may occur with  improper specimen collection/handling, submission of specimen other than nasopharyngeal swab, presence of viral mutation(s) within the areas targeted by this assay, and inadequate number of viral copies(<138 copies/mL). A negative result must be combined with clinical observations, patient history, and epidemiological information. The expected result is Negative.  Fact Sheet for Patients:  EntrepreneurPulse.com.au  Fact Sheet for Healthcare Providers:  IncredibleEmployment.be  This test is no t yet approved or cleared by the [place] FDA and  has been authorized for detection and/or diagnosis of SARS-CoV-2 by FDA under an Emergency Use Authorization (EUA). This EUA will remain  in effect (meaning this test can be used) for the duration of the COVID-19 declaration under Section 564(b)(1) of the Act, 21 U.S.C.section 360bbb-3(b)(1), unless the authorization is terminated  or revoked sooner.       Influenza A by PCR NEGATIVE NEGATIVE Final   Influenza B by PCR NEGATIVE NEGATIVE Final    Comment: (NOTE) The Xpert Xpress SARS-CoV-2/FLU/RSV plus assay is intended as an aid in the diagnosis of influenza from Nasopharyngeal swab specimens and should not be used as a sole basis for treatment. Nasal washings and aspirates are unacceptable for Xpert Xpress SARS-CoV-2/FLU/RSV testing.  Fact Sheet for Patients: EntrepreneurPulse.com.au  Fact Sheet for Healthcare Providers: IncredibleEmployment.be  This test is not yet approved or cleared by the [place] FDA and has been authorized for detection and/or diagnosis of SARS-CoV-2 by FDA under an Emergency Use Authorization (EUA). This EUA will remain in effect  (meaning this test can be used) for the duration of the COVID-19 declaration under Section 564(b)(1) of the Act, 21 U.S.C. section 360bbb-3(b)(1), unless the authorization is terminated or revoked.  Performed at [facility]  [facility], [address], [place], [place] [address]     Radiology Reports CT Head Wo Contrast  Result Date: 09/10/2021 CLINICAL DATA:  Delirium. EXAM: CT HEAD WITHOUT CONTRAST TECHNIQUE: Contiguous axial images were obtained from the base of the skull through the vertex without intravenous contrast. COMPARISON:  Head CT 09/18/2014 FINDINGS: Brain: Brain volume is normal for age. No intracranial hemorrhage, mass effect, or midline shift. No hydrocephalus. The basilar cisterns are patent. No evidence of territorial infarct or acute ischemia. No extra-axial or intracranial fluid collection. Vascular: Atherosclerosis of skullbase vasculature without hyperdense vessel or abnormal calcification. Right ICA stent, adjacent rim calcified aneurysm, grossly stable in size from 2016 exam. Skull: No fracture or focal lesion. Sinuses/Orbits: No acute findings.  Left cataract resection. Other: None. IMPRESSION: 1. No acute intracranial abnormality. 2. Right ICA stent, adjacent rim calcified aneurysm, grossly stable in size from 2016 exam. Electronically Signed   By: Keith Rake M.D.   On: 09/10/2021 21:57    SIGNED: Deatra James, MD, FHM. [facility],  Pager (please use [email or web address] to page/text) Please use Epic Secure Chat for non-urgent communication (7AM-7PM)   If 7PM-7AM, please contact night-coverage [email or web address], 09/13/2021, 11:21 AM

## 2021-09-13 NOTE — Progress Notes (Signed)
Report given to Ashley, RN on 300. 

## 2021-09-14 LAB — BASIC METABOLIC PANEL
Anion gap: 8 (ref 5–15)
BUN: 7 mg/dL — ABNORMAL LOW (ref 8–23)
CO2: 24 mmol/L (ref 22–32)
Calcium: 9 mg/dL (ref 8.9–10.3)
Chloride: 107 mmol/L (ref 98–111)
Creatinine, Ser: 0.89 mg/dL (ref 0.61–1.24)
GFR, Estimated: >60 mL/min (ref >60)
Glucose, Bld: 102 mg/dL — ABNORMAL HIGH (ref 70–99)
Potassium: 3.3 mmol/L — ABNORMAL LOW (ref 3.5–5.1)
Sodium: 139 mmol/L (ref 135–145)

## 2021-09-14 MED ORDER — HYDRALAZINE HCL 20 MG/ML IJ SOLN: 10.0000 mg | INTRAMUSCULAR | Status: DC | PRN

## 2021-09-14 MED ORDER — POTASSIUM CHLORIDE CRYS ER 20 MEQ PO TBCR
40.0000 meq | EXTENDED_RELEASE_TABLET | Freq: Once | ORAL | Status: AC
  Administered 2021-09-14: 40 meq via ORAL
  Filled 2021-09-14: qty 2

## 2021-09-14 NOTE — Progress Notes (Signed)
Messaged on-call MD concerning patient's blood pressure of 177/107 at 0601 this morning. Awaiting return response from MD. Reported elevated blood pressure to day shift nurse.

## 2021-09-14 NOTE — Discharge Summary (Signed)
Physician Discharge Summary [facility]    Patient: Philip Gates                   Admit date: 09/10/2021   DOB: Feb 16, 1956             Discharge date:09/14/2021/10:34 AM SNK:[id number]                          PCP: Pablo Lawrence, NP  Disposition:   HOME     Recommendations for Outpatient Follow-up:   Follow up: With PCP within 1-2 weeks, (polypharmacy medications to be modified accordingly) Follow-up with pain management clinic (medication needs to be modified accordingly)  Discharge Condition: Stable   Code Status:   Code Status: Full Code  Diet recommendation: Cardiac diet   Discharge Diagnoses:    Principal Problem:   Altered mental status Active Problems:   Asthma   Gastroesophageal reflux disease without esophagitis   Essential hypertension   Benign prostatic hyperplasia without lower urinary tract symptoms   Mixed hyperlipidemia   Opiate dependence ([place])   Alcohol abuse   Transaminitis   Obesity (BMI 30.0-34.9)   COPD (chronic obstructive pulmonary disease) (HCC)   Chronic back pain   History of Present Illness/ Hospital Course Philip Gates Summary:    Philip Gates is a 66 y.o. male with medical history significant for hypertension, chronic back pain (multiple orthopedic surgeries, knee hip back) on chronic opioids, hyperlipidemia, Asthma/COPD, GERD who presents to the emergency department due to possible allergic reaction.    somnolent after placing him on Precedex,  Patient is chronically on opiates with last dose being in the afternoon  He was recently started on naltrexone, he took first dose afternoon (1:30 PM) 09/10/20, shortly after this, patient started to feel restless, agitated, and felt like he was "crawling out of his skin.  He admitted to recreational use of alcohol with last ingestion being last night.    ED Course:  Vitals stable, work-up in the ED showed normocytic anemia, BUN/creatinine 21/1.44 (baseline creatinine at 0.8-0.9), urine  drug screen was positive for opiates and amphetamine, alcohol level was negative, total CK was 156.   Influenza A, B, SARS coronavirus 2 was negative. CT of head without contrast showed no acute intracranial abnormality, clonidine 0.1 mg x 3 and several hour rounds of 1 mg lorazepam were given without much effect, patient was then placed on Precedex.  IV hydration was provided.          Altered mental status due to multifactorial -Resolved-back to baseline  -Off Precedex,  -Opiate dependence/withdrawal-reaction from naltrexone Alcohol abuse/withdrawal -Weaned off Precedex, resuming CIWA protocol  -Continue neurochecks,  - fall precaution, aspiration precaution, seizure precaution   -Whenever patient is successfully tapered off Precedex we will initiate CIWA protocol    -Patient and wife has been counseled extensively regarding current polypharmacy, and alcohol consumption, counseled regarding alcohol cessation Consult regarding medication side effects and withdrawal  Hypokalemia  -Repleting orally   Transaminitis Monitoring LFTs-improved    Essential hypertension Continue Toprol-XL, Micardis  -Remained stable     Acute kidney injury -Resolved - BUN/creatinine 21/1.44 (baseline creatinine at 0.8-0.9)>> creatinine 0.86 -Status post IV fluid resuscitation Renally adjust medications, avoid nephrotoxic agents/dehydration/hypotension  Asthma/COPD Continue breztri, Atrovent, Singulair -Off supplemental oxygen, satting 96% on room air  Chronic back pain -Extensive history of chronic back pain, surgeries  -continue to complain of pain, as needed analgesics,  -Gabapentin initiated patient stating  cannot tolerate it -Home regimen reviewed, resumed recommending current medication to be reviewed PCP and pain management clinic for further modification -Naltrexone discontinued -Patient is adamant reluctant on coming off his current medication of Norco stating he would only take it  as needed   BPH Resuming home medication-including dutasteride-tamsulosin  GERD Home regimen reviewed resumed   Obesity (BMI 31.85 kg/) Patient will be counseled on lifestyle and diet modification when more awake      Code Status: Full code     Disposition Plan:  Patient is from:                        [place] to:                   Home    Discharge Instructions:   Discharge Instructions     Activity as tolerated - No restrictions   Complete by: As directed    Diet - low sodium heart healthy   Complete by: As directed    Discharge instructions   Complete by: As directed    Follow-up with PCP within 1-2 weeks-if medication needs to be modified accordingly. Follow-up with pain management clinic-for modification of current medication accordingly. Avoid extra dose of narcotics, or alcohol ingestion with current medications   Increase activity slowly   Complete by: As directed         Medication List     STOP taking these medications    benzonatate 100 MG capsule Commonly known as: TESSALON   BLACK ELDERBERRY PO   levocetirizine 5 MG tablet Commonly known as: XYZAL   montelukast 10 MG tablet Commonly known as: SINGULAIR   naltrexone 50 MG tablet Commonly known as: DEPADE   OVER THE COUNTER MEDICATION   telmisartan 40 MG tablet Commonly known as: MICARDIS       TAKE these medications    aspirin EC 81 MG tablet Take 81 mg by mouth in the morning. Swallow whole.   atorvastatin 10 MG tablet Commonly known as: LIPITOR Take 10 mg by mouth. Take Monday,Wednesday and Friday evening   Breztri Aerosphere 160-9-4.8 MCG/ACT Aero Generic drug: Budeson-Glycopyrrol-Formoterol Inhale 1 puff into the lungs in the morning and at bedtime.   cyclobenzaprine 5 MG tablet Commonly known as: FLEXERIL Take 1 tablet (5 mg total) by mouth 3 (three) times daily as needed for muscle spasms.   dexlansoprazole 60 MG capsule Commonly known as:  DEXILANT Take 60 mg by mouth every morning.   DIGESTIVE ADV MULTI-STRAIN PO Take 1 capsule by mouth in the morning and at bedtime.   Dutasteride-Tamsulosin HCl 0.5-0.4 MG Caps Take 1 capsule by mouth in the morning.   famotidine 20 MG tablet Commonly known as: PEPCID Take 20 mg by mouth in the morning and at bedtime.   HYDROcodone-acetaminophen 10-325 MG tablet Commonly known as: NORCO Take 1 tablet by mouth every 4 (four) hours as needed for moderate pain.   IBU 800 MG tablet Generic drug: ibuprofen Take 800 mg by mouth every 8 (eight) hours as needed (pain).   ipratropium 0.03 % nasal spray Commonly known as: ATROVENT Place into both nostrils.   lamoTRIgine 200 MG tablet Commonly known as: LAMICTAL Take 1 tablet (200 mg total) by mouth at bedtime.   lisdexamfetamine 70 MG capsule Commonly known as: Vyvanse Take 1 capsule (70 mg total) by mouth daily.   metoprolol succinate 50 MG 24 hr tablet Commonly known as: TOPROL-XL Take 50 mg  by mouth at bedtime.   mometasone 0.1 % cream Commonly known as: ELOCON Apply 1 application topically daily as needed (skin irritation).   multivitamin with minerals tablet Take 1 tablet by mouth daily. Multivitamin for Women (needs the iron)   MUSCLE CRAMP COMPLEX PO Take 1 capsule by mouth in the morning and at bedtime. Cramp Defense Magnesium for Leg Cramps, Muscle Cramps & Muscle Spasms.   Nasal Cleanse Rinse Mix Pack Place 1 Dose into the nose daily. W/Azithromycin   ProAir HFA 108 (90 Base) MCG/ACT inhaler Generic drug: albuterol Inhale 1-2 puffs into the lungs every 6 (six) hours as needed for shortness of breath or wheezing.   promethazine 25 MG tablet Commonly known as: PHENERGAN Take 25 mg by mouth every 6 (six) hours as needed for nausea or vomiting.   Prostate Health Caps Take 1 capsule by mouth in the morning and at bedtime.   tadalafil 20 MG tablet Commonly known as: CIALIS Take 20 mg by mouth at bedtime as  needed for erectile dysfunction.   telmisartan-hydrochlorothiazide 80-12.5 MG tablet Commonly known as: MICARDIS HCT Take 1 tablet by mouth daily.   testosterone cypionate 200 MG/ML injection Commonly known as: DEPOTESTOSTERONE CYPIONATE Inject 200 mg into the muscle every 14 (fourteen) days.   vitamin C 1000 MG tablet Take 1,000 mg by mouth in the morning and at bedtime.        Allergies  Allergen Reactions   Aspirin Other (See Comments)    Do not take with depakote.    Clindamycin Other (See Comments)    Abdominal pain "twisted stomach" Abdominal pain "twisted stomach"   Iodine Other (See Comments)   Erythromycin     Caused a "twisted stomach"   Penicillins Rash     Quit smoking:  It is highly recommended that all people especially deals with diabetes to quit smoking or stay away from smoking, avoid secondhand smoking.   Vaccines:  Also highly recommended update your vaccines requirement, including SARS-CoV-2 , yearly  flu vaccine and pneumonia vaccine at least every 5 years.      Exercise: If you are able: 30 -60 minutes a day, 4 days a week, or 150 minutes a week.  The longer the better.  Combine stretch, strength, and aerobic activities.  If you were told in the past that you have high risk for cardiovascular diseases, you may seek evaluation by your heart doctor prior to initiating moderate to intense exercise programs.    One other important lifestyle recommendation is to ensure adequate sleep - at least 6-7 hours of uninterrupted sleep at night.  Procedures /Studies:   CT Head Wo Contrast  Result Date: 09/10/2021 CLINICAL DATA:  Delirium. EXAM: CT HEAD WITHOUT CONTRAST TECHNIQUE: Contiguous axial images were obtained from the base of the skull through the vertex without intravenous contrast. COMPARISON:  Head CT 09/18/2014 FINDINGS: Brain: Brain volume is normal for age. No intracranial hemorrhage, mass effect, or midline shift. No hydrocephalus. The basilar  cisterns are patent. No evidence of territorial infarct or acute ischemia. No extra-axial or intracranial fluid collection. Vascular: Atherosclerosis of skullbase vasculature without hyperdense vessel or abnormal calcification. Right ICA stent, adjacent rim calcified aneurysm, grossly stable in size from 2016 exam. Skull: No fracture or focal lesion. Sinuses/Orbits: No acute findings.  Left cataract resection. Other: None. IMPRESSION: 1. No acute intracranial abnormality. 2. Right ICA stent, adjacent rim calcified aneurysm, grossly stable in size from 2016 exam. Electronically Signed   By: Aurther Loft.D.  On: 09/10/2021 21:57    Subjective:   Patient was seen and examined 09/14/2021, 10:34 AM Patient stable today. No acute distress.  No issues overnight Stable for discharge.  Discharge Exam:    Vitals:   09/13/21 2004 09/14/21 0500 09/14/21 0601 09/14/21 0840  BP:  (!) 164/102 (!) 177/107   Pulse:  72 97   Resp:  18 18   Temp:  98.1 F (36.7 C) 97.6 F (36.4 C)   TempSrc:  Oral Oral   SpO2: 97% 98% 96% 97%  Weight:      Height:        General: Pt lying comfortably in bed & appears in no obvious distress. Cardiovascular: S1 & S2 heard, RRR, S1/S2 +. No murmurs, rubs, gallops or clicks. No JVD or pedal edema. Respiratory: Clear to auscultation without wheezing, rhonchi or crackles. No increased work of breathing. Abdominal:  Non-distended, non-tender & soft. No organomegaly or masses appreciated. Normal bowel sounds heard. CNS: Alert and oriented. No focal deficits. Extremities: no edema, no cyanosis      The results of significant diagnostics from this hospitalization (including imaging, microbiology, ancillary and laboratory) are listed below for reference.      Microbiology:   Recent Results (from the past 240 hour(s))  Resp Panel by RT-PCR (Flu A&B, Covid) Nasopharyngeal Swab     Status: None   Collection Time: 09/10/21  8:30 PM   Specimen: Nasopharyngeal Swab;  Nasopharyngeal(NP) swabs in vial transport medium  Result Value Ref Range Status   SARS Coronavirus 2 by RT PCR NEGATIVE NEGATIVE Final    Comment: (NOTE) SARS-CoV-2 target nucleic acids are NOT DETECTED.  The SARS-CoV-2 RNA is generally detectable in upper respiratory specimens during the acute phase of infection. The lowest concentration of SARS-CoV-2 viral copies this assay can detect is 138 copies/mL. A negative result does not preclude SARS-Cov-2 infection and should not be used as the sole basis for treatment or other patient management decisions. A negative result may occur with  improper specimen collection/handling, submission of specimen other than nasopharyngeal swab, presence of viral mutation(s) within the areas targeted by this assay, and inadequate number of viral copies(<138 copies/mL). A negative result must be combined with clinical observations, patient history, and epidemiological information. The expected result is Negative.  Fact Sheet for Patients:  EntrepreneurPulse.com.au  Fact Sheet for Healthcare Providers:  IncredibleEmployment.be  This test is no t yet approved or cleared by the [place] FDA and  has been authorized for detection and/or diagnosis of SARS-CoV-2 by FDA under an Emergency Use Authorization (EUA). This EUA will remain  in effect (meaning this test can be used) for the duration of the COVID-19 declaration under Section 564(b)(1) of the Act, 21 U.S.C.section 360bbb-3(b)(1), unless the authorization is terminated  or revoked sooner.       Influenza A by PCR NEGATIVE NEGATIVE Final   Influenza B by PCR NEGATIVE NEGATIVE Final    Comment: (NOTE) The Xpert Xpress SARS-CoV-2/FLU/RSV plus assay is intended as an aid in the diagnosis of influenza from Nasopharyngeal swab specimens and should not be used as a sole basis for treatment. Nasal washings and aspirates are unacceptable for Xpert Xpress  SARS-CoV-2/FLU/RSV testing.  Fact Sheet for Patients: EntrepreneurPulse.com.au  Fact Sheet for Healthcare Providers: IncredibleEmployment.be  This test is not yet approved or cleared by the [place] FDA and has been authorized for detection and/or diagnosis of SARS-CoV-2 by FDA under an Emergency Use Authorization (EUA). This EUA will remain in effect (meaning  this test can be used) for the duration of the COVID-19 declaration under Section 564(b)(1) of the Act, 21 U.S.C. section 360bbb-3(b)(1), unless the authorization is terminated or revoked.  Performed at [facility], [address], [place], [place] [address]      Labs:   CBC: Recent Labs  Lab 09/10/21 1515 09/11/21 0342  WBC 7.6 4.8  NEUTROABS 5.2  --   HGB 12.7* 12.9*  HCT 37.1* 36.8*  MCV 98.1 97.6  PLT 196 088   Basic Metabolic Panel: Recent Labs  Lab 09/10/21 1515 09/11/21 0342 09/12/21 0426 09/13/21 0419 09/14/21 0459  NA 134* 140 138 140 139  K 3.7 4.1 3.7 3.0* 3.3*  CL 102 111 108 106 107  CO2 21* 22 24 26 24   GLUCOSE 90 117* 105* 115* 102*  BUN 21 12 11 8  7*  CREATININE 1.44* 0.86 0.86 0.86 0.89  CALCIUM 8.8* 9.1 8.7* 8.8* 9.0  MG  --  2.1  --   --   --   PHOS  --  2.6  --   --   --    Liver Function Tests: Recent Labs  Lab 09/10/21 1515 09/11/21 0342  AST 45* 38  ALT 51* 47*  ALKPHOS 69 61  BILITOT 0.7 0.6  PROT 6.7 6.4*  ALBUMIN 3.9 3.6   BNP (last 3 results) No results for input(s): BNP in the last 8760 hours. Cardiac Enzymes: Recent Labs  Lab 09/10/21 1515  CKTOTAL 156    Urinalysis    Component Value Date/Time   COLORURINE YELLOW 07/17/2015 0700   APPEARANCEUR CLEAR 07/17/2015 0700   LABSPEC 1.016 07/17/2015 0700   PHURINE 6.5 07/17/2015 0700   GLUCOSEU NEGATIVE 07/17/2015 0700   HGBUR NEGATIVE 07/17/2015 0700   BILIRUBINUR NEGATIVE 07/17/2015 0700   KETONESUR NEGATIVE 07/17/2015 0700   PROTEINUR NEGATIVE 07/17/2015 0700    UROBILINOGEN 0.2 07/17/2015 0700   NITRITE NEGATIVE 07/17/2015 0700   LEUKOCYTESUR NEGATIVE 07/17/2015 0700         Time coordinating discharge: Over 45 minutes  SIGNED: Deatra James, MD, FACP, [facility]. [facility],  Please use [email or web address] to Page If 7PM-7AM, please contact night-coverage [email or web address],  09/14/2021, 10:34 AM

## 2021-09-14 NOTE — Progress Notes (Signed)
Went over discharge instructions with patient. No questions or concerns. Pt was walked by staff to main entrance to wait for his transportation.

## 2021-10-10 DIAGNOSIS — H9313 Tinnitus, bilateral: Secondary | ICD-10-CM | POA: Insufficient documentation

## 2021-10-10 DIAGNOSIS — H903 Sensorineural hearing loss, bilateral: Secondary | ICD-10-CM | POA: Insufficient documentation

## 2021-10-22 ENCOUNTER — Emergency Department (HOSPITAL_COMMUNITY)
Admission: EM | Admit: 2021-10-22 | Discharge: 2021-10-22 | Disposition: A | Discharge status: Home / Self Care | Payer: PPO | Attending: Emergency Medicine | Admitting: Emergency Medicine

## 2021-10-22 ENCOUNTER — Ambulatory Visit
Admission: EM | Admit: 2021-10-22 | Discharge: 2021-10-22 | Disposition: A | Discharge status: Other Acute Inpatient Hospital | Payer: PPO

## 2021-10-22 ENCOUNTER — Other Ambulatory Visit: Payer: Self-pay

## 2021-10-22 ENCOUNTER — Emergency Department (HOSPITAL_COMMUNITY): Payer: PPO

## 2021-10-22 DIAGNOSIS — R000 Tachycardia, unspecified: Secondary | ICD-10-CM | POA: Insufficient documentation

## 2021-10-22 DIAGNOSIS — I213 ST elevation (STEMI) myocardial infarction of unspecified site: Secondary | ICD-10-CM | POA: Diagnosis not present

## 2021-10-22 DIAGNOSIS — J449 Chronic obstructive pulmonary disease, unspecified: Secondary | ICD-10-CM | POA: Insufficient documentation

## 2021-10-22 DIAGNOSIS — R0989 Other specified symptoms and signs involving the circulatory and respiratory systems: Secondary | ICD-10-CM

## 2021-10-22 DIAGNOSIS — J45909 Unspecified asthma, uncomplicated: Secondary | ICD-10-CM | POA: Diagnosis not present

## 2021-10-22 DIAGNOSIS — Z7951 Long term (current) use of inhaled steroids: Secondary | ICD-10-CM | POA: Insufficient documentation

## 2021-10-22 DIAGNOSIS — Z20822 Contact with and (suspected) exposure to covid-19: Secondary | ICD-10-CM | POA: Insufficient documentation

## 2021-10-22 DIAGNOSIS — I10 Essential (primary) hypertension: Secondary | ICD-10-CM | POA: Diagnosis not present

## 2021-10-22 DIAGNOSIS — Z79899 Other long term (current) drug therapy: Secondary | ICD-10-CM | POA: Diagnosis not present

## 2021-10-22 DIAGNOSIS — Z7982 Long term (current) use of aspirin: Secondary | ICD-10-CM | POA: Insufficient documentation

## 2021-10-22 DIAGNOSIS — R0789 Other chest pain: Secondary | ICD-10-CM | POA: Diagnosis present

## 2021-10-22 DIAGNOSIS — M7918 Myalgia, other site: Secondary | ICD-10-CM | POA: Insufficient documentation

## 2021-10-22 LAB — RESP PANEL BY RT-PCR (FLU A&B, COVID) ARPGX2
Influenza A by PCR: NEGATIVE
Influenza B by PCR: NEGATIVE
SARS Coronavirus 2 by RT PCR: NEGATIVE

## 2021-10-22 LAB — COMPREHENSIVE METABOLIC PANEL
ALT: 45 U/L — ABNORMAL HIGH (ref 0–44)
AST: 31 U/L (ref 15–41)
Albumin: 3.8 g/dL (ref 3.5–5.0)
Alkaline Phosphatase: 57 U/L (ref 38–126)
Anion gap: 12 (ref 5–15)
BUN: 15 mg/dL (ref 8–23)
CO2: 23 mmol/L (ref 22–32)
Calcium: 8.9 mg/dL (ref 8.9–10.3)
Chloride: 102 mmol/L (ref 98–111)
Creatinine, Ser: 1.1 mg/dL (ref 0.61–1.24)
GFR, Estimated: >60 mL/min (ref >60)
Glucose, Bld: 120 mg/dL — ABNORMAL HIGH (ref 70–99)
Potassium: 4 mmol/L (ref 3.5–5.1)
Sodium: 137 mmol/L (ref 135–145)
Total Bilirubin: 0.5 mg/dL (ref 0.3–1.2)
Total Protein: 6.4 g/dL — ABNORMAL LOW (ref 6.5–8.1)

## 2021-10-22 LAB — CBC WITH DIFFERENTIAL/PLATELET
Abs Immature Granulocytes: 0.02 10*3/uL (ref 0.00–0.07)
Basophils Absolute: 0 10*3/uL (ref 0.0–0.1)
Basophils Relative: 0 %
Eosinophils Absolute: 0.1 10*3/uL (ref 0.0–0.5)
Eosinophils Relative: 1 %
HCT: 38.7 % — ABNORMAL LOW (ref 39.0–52.0)
Hemoglobin: 13.3 g/dL (ref 13.0–17.0)
Immature Granulocytes: 0 %
Lymphocytes Relative: 17 %
Lymphs Abs: 0.9 10*3/uL (ref 0.7–4.0)
MCH: 34.1 pg — ABNORMAL HIGH (ref 26.0–34.0)
MCHC: 34.4 g/dL (ref 30.0–36.0)
MCV: 99.2 fL (ref 80.0–100.0)
Monocytes Absolute: 0.5 10*3/uL (ref 0.1–1.0)
Monocytes Relative: 8 %
Neutro Abs: 4.1 10*3/uL (ref 1.7–7.7)
Neutrophils Relative %: 74 %
Platelets: 237 10*3/uL (ref 150–400)
RBC: 3.9 MIL/uL — ABNORMAL LOW (ref 4.22–5.81)
RDW: 13.1 % (ref 11.5–15.5)
WBC: 5.5 10*3/uL (ref 4.0–10.5)
nRBC: 0 % (ref 0.0–0.2)

## 2021-10-22 LAB — TROPONIN I (HIGH SENSITIVITY)
Troponin I (High Sensitivity): 4 ng/L (ref <18)
Troponin I (High Sensitivity): 3 ng/L (ref <18)

## 2021-10-22 LAB — PROTIME-INR
INR: 1 (ref 0.8–1.2)
Prothrombin Time: 12.8 seconds (ref 11.4–15.2)

## 2021-10-22 LAB — MAGNESIUM: Magnesium: 1.9 mg/dL (ref 1.7–2.4)

## 2021-10-22 LAB — LIPASE, BLOOD: Lipase: 46 U/L (ref 11–51)

## 2021-10-22 IMAGING — DX DG CHEST 1V PORT
1 series · 1 of 1 positions shown · non-contrast
Comparison: [DATE]

CLINICAL DATA: Chest pain

EXAM:
PORTABLE CHEST 1 VIEW

[chest ap]
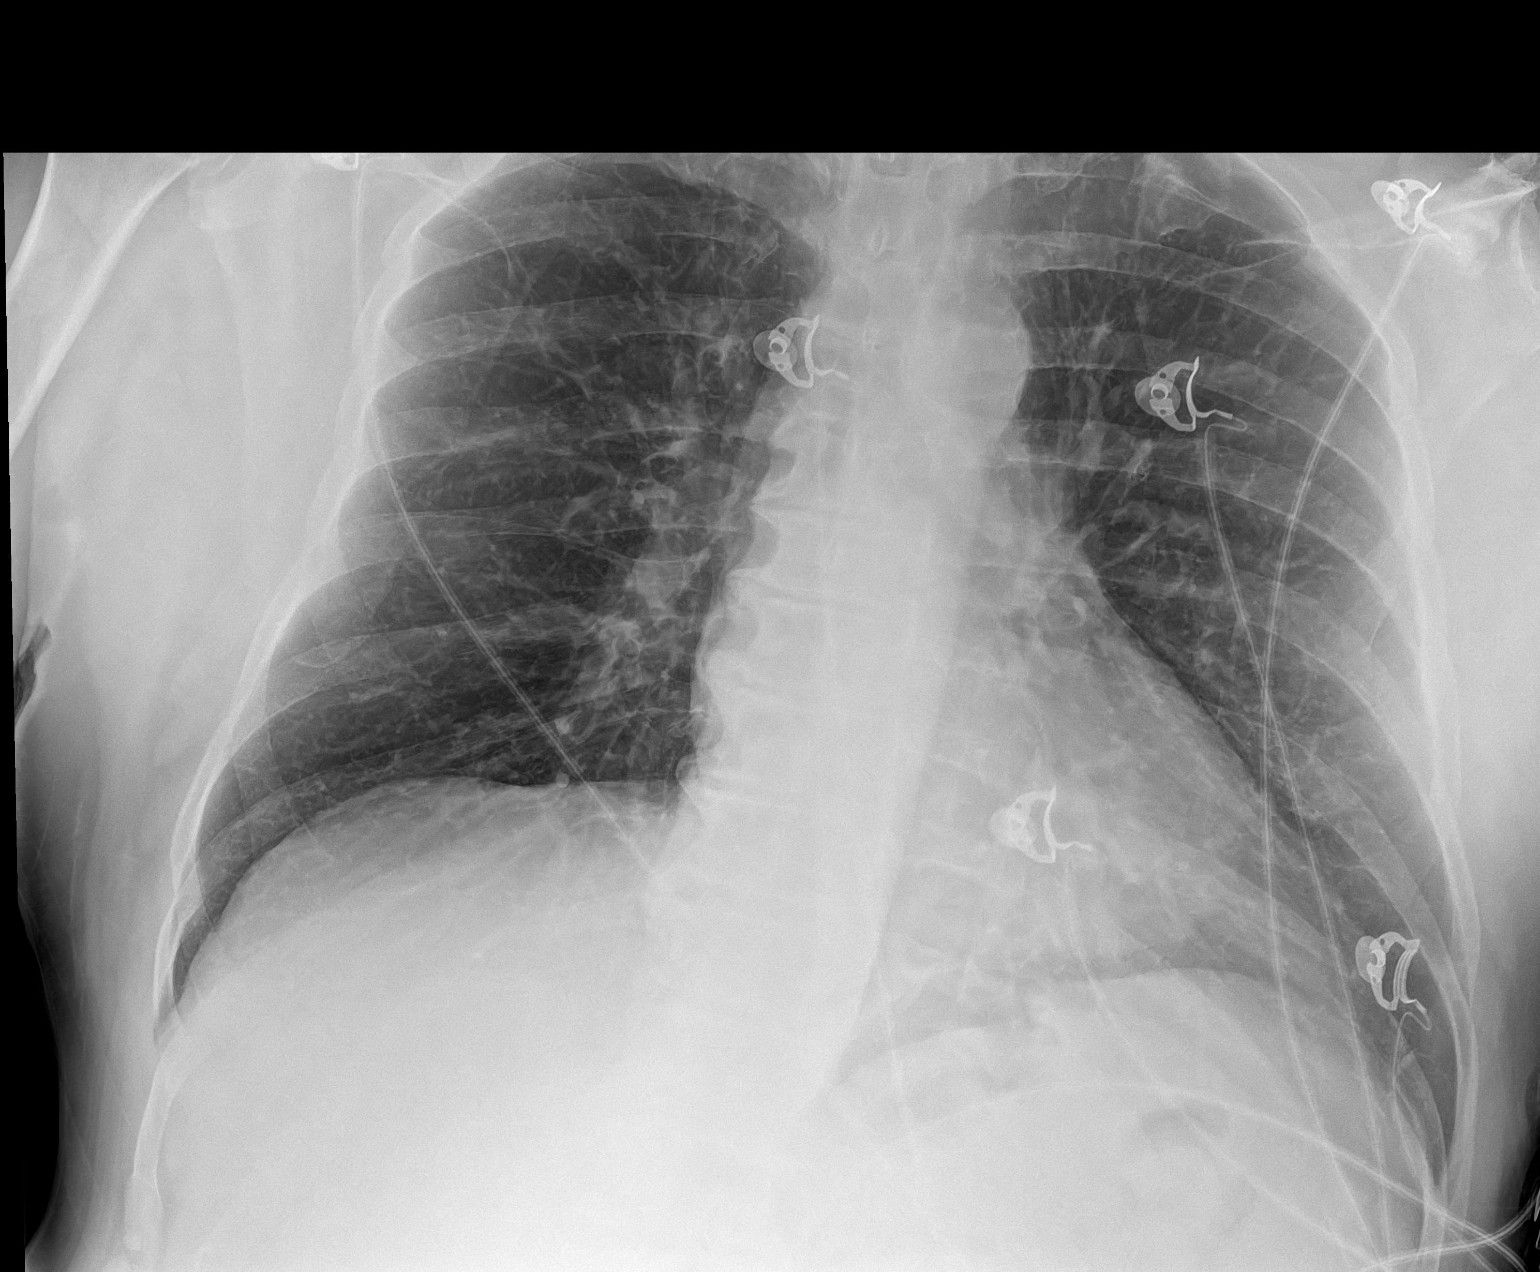

[1 of 1 positions shown; findings below may reference images not displayed]

FINDINGS: The heart size and mediastinal contours are within normal limits. No
focal airspace consolidation. No visible pleural effusion or
pneumothorax. The visualized skeletal structures are unremarkable.
IMPRESSION: No acute cardiopulmonary findings.

## 2021-10-22 MED ORDER — BENZONATATE 100 MG PO CAPS: 100.0000 mg | ORAL_CAPSULE | Freq: Three times a day (TID) | ORAL | 0 refills | Status: DC | PRN

## 2021-10-22 MED ORDER — PREDNISONE 10 MG PO TABS: 40.0000 mg | ORAL_TABLET | Freq: Every day | ORAL | 0 refills | Status: AC

## 2021-10-22 MED ORDER — DOXYCYCLINE HYCLATE 100 MG PO CAPS: 100.0000 mg | ORAL_CAPSULE | Freq: Two times a day (BID) | ORAL | 0 refills | Status: AC

## 2021-10-22 NOTE — ED Triage Notes (Signed)
Patient is being discharged from the Urgent Care and sent to the Emergency Department via ems . Per Olene Craven PA, patient is in need of higher level of care due to possible stemi on ekg . Patient is aware and verbalizes understanding of plan of care.  Vitals:   10/22/21 0812  BP: 111/70  Pulse: (!) 110  Resp: 18  Temp: 98 F (36.7 C)  SpO2: 95%

## 2021-10-22 NOTE — ED Triage Notes (Addendum)
Patient via EMS for chest pain from urgent care. Patient given Asprin and IV started by urgent care. Patient having chest pain thre previous day but not today. Went to the urgent care for cough and congestion.

## 2021-10-22 NOTE — ED Provider Notes (Signed)
[facility]    CSN: [id number] Arrival date & time: 10/22/21  0801      History   Chief Complaint Chief Complaint  Patient presents with   Nasal Congestion   Chest Pain    HPI Philip Gates is a 66 y.o. male presenting with cough and congestion.  Now with new onset of left-sided chest pain.  Medical history COPD, asthma, cerebral aneurysm, venous insufficiency, GERD.  Describes symptoms for 6 days, with 1 day of the left-sided chest pain.  Describes this as sharp and intermittent, he is currently without pain. Denies current SOB, CP, dizziness, weakness, headaches, vision changes. Taking his antihypertensives as directed.   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   Venous insufficiency     Patient Active Problem List   Diagnosis Date Noted   Alcohol abuse 09/11/2021   Transaminitis 09/11/2021   Obesity (BMI 30.0-34.9) 09/11/2021   COPD (chronic obstructive pulmonary disease) ([place]) 09/11/2021   Chronic back pain 09/11/2021   Altered mental status 09/11/2021   Inflammation of sacroiliac joint ([place]) 09/10/2021   Opiate dependence ([place]) 09/10/2021   Overweight (BMI 25.0-29.9) 08/16/2021   Sacroiliitis ([place]) 07/17/2021   Body mass index (BMI) 30.0-30.9, adult 05/29/2021   Lumbar spondylosis 02/25/2021   Mixed hyperlipidemia 02/15/2021    Asthma 02/13/2021   Elevated blood-pressure reading, without diagnosis of hypertension 12/17/2020   Body mass index (BMI) 31.0-31.9, adult 12/17/2020   Spinal stenosis, 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   Tachycardia 11/16/2020   Gastroesophageal reflux disease without esophagitis 10/15/2020   Bipolar affective disorder in remission ([place]) 10/15/2020   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   Seasonal allergies 10/15/2020   Chronic pain syndrome 10/15/2020   Mixed simple and mucopurulent chronic bronchitis ([place]) 10/15/2020   Male erectile disorder 10/15/2020   History of cerebral aneurysm repair 10/15/2020   Degenerative disc disease, cervical 10/15/2020   Benign prostatic hyperplasia without lower urinary tract symptoms 10/15/2020   Essential hypertension 06/27/2020   Body mass index (BMI) 29.0-29.9, adult 05/22/2020   Pain in joint of left shoulder 09/12/2019   Pain in joint of left elbow 09/12/2019   Pain in thumb joint with movement of left hand 09/12/2019   Osteoarthritis of carpometacarpal (CMC) joint of thumb 09/12/2019   Spinal stenosis of lumbar region without neurogenic claudication 01/19/2019   Prolapsed lumbar disc 01/19/2019   Stenosis of intervertebral foramina 10/04/2018   Cervical radiculitis 10/04/2018   Acute back pain with sciatica 07/19/2018   Lumbago with sciatica, left side 07/19/2018   Family hx of colon cancer 11/13/2016   Absolute anemia 11/13/2016   Varicose veins of right lower extremity with complications 36/14/4315   Bipolar  1 disorder, mixed, moderate ([place]) 10/12/2015   Attention deficit hyperactivity disorder (ADHD), combined type 10/12/2015   Neck pain 09/13/2015   Chronic low back pain 09/13/2015   Chronic venous insufficiency 06/25/2015   SBO (small bowel obstruction) ([place]) 01/16/2015   OA (osteoarthritis) of knee 10/06/2014   Intracranial aneurysm 09/25/2014   Facial cellulitis 05/31/2014   OA (osteoarthritis) of hip 01/03/2013     Past Surgical History:  Procedure Laterality Date   cerebral stent     COLONOSCOPY N/A 01/02/2017   Procedure: COLONOSCOPY;  Surgeon: Rogene Houston, MD;  [facility];  Service: Endoscopy;  Laterality: N/A;   dental implant  09/26/2014   upper and lower dental implant/bridges- permanent   ESOPHAGOGASTRODUODENOSCOPY N/A 01/02/2017   Procedure: ESOPHAGOGASTRODUODENOSCOPY (EGD);  Surgeon: Rogene Houston, MD;  Location: [facility];  Service: Endoscopy;  Laterality: N/A;  910   EYE SURGERY Left Jan. 16, 2017   Cataract   IR ANGIO INTRA EXTRACRAN SEL INTERNAL CAROTID BILAT MOD SED  01/20/2017   IR ANGIO VERTEBRAL SEL VERTEBRAL UNI L MOD SED  01/20/2017   JOINT REPLACEMENT Right    Knee   JOINT REPLACEMENT Left    Hip   NOSE SURGERY     RADIOLOGY WITH ANESTHESIA N/A 12/29/2014   Procedure: Embolization;  Surgeon: Consuella Lose, MD;  Location: [place];  Service: Radiology;  Laterality: N/A;   SUBCLAVIAN ANGIOGRAM  Nov. 8, 2016   TOTAL HIP ARTHROPLASTY Left 01/03/2013   Procedure: TOTAL HIP ARTHROPLASTY ANTERIOR APPROACH;  Surgeon: Gearlean Alf, MD;  Location: [facility];  Service: Orthopedics;  Laterality: Left;   TOTAL KNEE ARTHROPLASTY Right 10/06/2014   Procedure: RIGHT TOTAL KNEE ARTHROPLASTY;  Surgeon: Gearlean Alf, MD;  Location: [facility];  Service: Orthopedics;  Laterality: Right;   WISDOM TOOTH EXTRACTION         Home Medications    Prior to Admission medications   Medication Sig Start Date End Date Taking? Authorizing Provider  Ascorbic Acid (VITAMIN C) 1000 MG tablet Take 1,000 mg by mouth in the morning and at bedtime.    [provider]  aspirin EC 81 MG tablet Take 81 mg by mouth in the morning. Swallow whole.    [provider]  atorvastatin (LIPITOR) 10 MG tablet Take 10 mg by mouth. Take Monday,Wednesday and Friday evening 08/16/21   [provider]  BREZTRI AEROSPHERE 160-9-4.8 MCG/ACT AERO Inhale 1 puff into the lungs in  the morning and at bedtime. 11/16/20   [provider]  cyclobenzaprine (FLEXERIL) 5 MG tablet Take 1 tablet (5 mg total) by mouth 3 (three) times daily as needed for muscle spasms. 12/08/20   Meyran, Ocie Cornfield, NP  dexlansoprazole (DEXILANT) 60 MG capsule Take 60 mg by mouth every morning.     [provider]  Dutasteride-Tamsulosin HCl 0.5-0.4 MG CAPS Take 1 capsule by mouth in the morning.    [provider]  famotidine (PEPCID) 20 MG tablet Take 20 mg by mouth in the morning and at bedtime. 11/06/20   [provider]  Homeopathic Products (MUSCLE CRAMP COMPLEX PO) Take 1 capsule by mouth in the morning and at bedtime. Cramp Defense Magnesium for Leg Cramps, Muscle Cramps & Muscle Spasms.    [provider]  IBU 800 MG tablet Take 800 mg by mouth every 8 (eight) hours as needed (pain). 11/16/20   [provider]  ipratropium (ATROVENT) 0.03 % nasal spray Place into both nostrils. 07/24/21  [provider]  lamoTRIgine (LAMICTAL) 200 MG tablet Take 1 tablet (200 mg total) by mouth at bedtime. 08/14/21   Cloria Spring, MD  lisdexamfetamine (VYVANSE) 70 MG capsule Take 1 capsule (70 mg total) by mouth daily. 08/14/21   Cloria Spring, MD  metoprolol succinate (TOPROL-XL) 50 MG 24 hr tablet Take 50 mg by mouth at bedtime. 11/16/20   [provider]  Misc Natural Product Nasal (NASAL CLEANSE RINSE MIX) PACK Place 1 Dose into the nose daily. W/Azithromycin    [provider]  Misc Natural Products (PROSTATE HEALTH) CAPS Take 1 capsule by mouth in the morning and at bedtime.    [provider]  mometasone (ELOCON) 0.1 % cream Apply 1 application topically daily as needed (skin irritation). 10/15/20   [provider]  Multiple Vitamins-Minerals (MULTIVITAMIN WITH MINERALS) tablet Take 1 tablet by mouth daily. Multivitamin for Women (needs the iron)    [provider]  PROAIR HFA 108 (90 Base) MCG/ACT  inhaler Inhale 1-2 puffs into the lungs every 6 (six) hours as needed for shortness of breath or wheezing. 10/21/17   [provider]  Probiotic Product (DIGESTIVE ADV MULTI-STRAIN PO) Take 1 capsule by mouth in the morning and at bedtime.    [provider]  promethazine (PHENERGAN) 25 MG tablet Take 25 mg by mouth every 6 (six) hours as needed for nausea or vomiting.    [provider]  tadalafil (CIALIS) 20 MG tablet Take 20 mg by mouth at bedtime as needed for erectile dysfunction.    [provider]  telmisartan-hydrochlorothiazide (MICARDIS HCT) 80-12.5 MG tablet Take 1 tablet by mouth daily. 08/23/21   [provider]  testosterone cypionate (DEPOTESTOTERONE CYPIONATE) 200 MG/ML injection Inject 200 mg into the muscle every 14 (fourteen) days.  12/16/14   [provider]    Family History Family History  Problem Relation Age of Onset   Arrhythmia Mother        Has pacemaker   Hypertension Mother    Heart disease Mother    Mental illness Mother    Varicose Veins Mother    Depression Mother    Colon cancer Father    Cancer Father        Prostate and Colon   Diabetes Father    Hypertension Father    Cancer - Colon Father    Depression Sister     Social History Social History   Tobacco Use   Smoking status: Former    Packs/day: 0.50    Types: Cigarettes    Start date: 05/23/2015    Quit date: 10/09/2017    Years since quitting: 4.0   Smokeless tobacco: Former    Types: Snuff   Tobacco comments:    Stated "I smoke off and on"  Vaping Use   Vaping Use: Never used  Substance Use Topics   Alcohol use: No    Comment: last had fireball last night 09/10/21   Drug use: No     Allergies   Naltrexone, Clindamycin, Iodine, Erythromycin, and Penicillins   Review of Systems Review of Systems  Constitutional:  Negative for appetite change, chills and fever.  HENT:  Positive for congestion. Negative for ear pain, rhinorrhea,  sinus pressure, sinus pain and sore throat.   Eyes:  Negative for redness and visual disturbance.  Respiratory:  Positive for cough. Negative for chest tightness, shortness of breath and wheezing.   Cardiovascular:  Positive for chest pain. Negative for palpitations.  Gastrointestinal:  Negative for abdominal pain, constipation, diarrhea, nausea and vomiting.  Genitourinary:  Negative for dysuria, frequency and urgency.  Musculoskeletal:  Negative for myalgias.  Neurological:  Negative for dizziness, weakness and headaches.  Psychiatric/Behavioral:  Negative for confusion.   All other systems reviewed and are negative.   Physical Exam Triage Vital Signs ED Triage Vitals  Enc Vitals Group     BP      Pulse      Resp      Temp      Temp src      SpO2      Weight      Height      Head Circumference      Peak Flow      Pain Score      Pain Loc      Pain Edu?      Excl. in [place]?    No data found.  Updated Vital Signs BP 111/70    Pulse (!) 110    Temp 98 F (36.7 C)    Resp 18    SpO2 95%   Visual Acuity Right Eye Distance:   Left Eye Distance:   Bilateral Distance:    Right Eye Near:   Left Eye Near:    Bilateral Near:     Physical Exam Vitals reviewed.  Constitutional:      Appearance: Normal appearance. He is not diaphoretic.  HENT:     Head: Normocephalic and atraumatic.     Mouth/Throat:     Mouth: Mucous membranes are moist.  Eyes:     Extraocular Movements: Extraocular movements intact.     Pupils: Pupils are equal, round, and reactive to light.  Cardiovascular:     Rate and Rhythm: Regular rhythm. Tachycardia present.     Pulses:          Radial pulses are 2+ on the right side and 2+ on the left side.     Heart sounds: Normal heart sounds.  Pulmonary:     Effort: Pulmonary effort is normal.     Breath sounds: Normal breath sounds.  Abdominal:     Palpations: Abdomen is soft.     Tenderness: There is no abdominal tenderness. There is no guarding or  rebound.  Musculoskeletal:     Right lower leg: No edema.     Left lower leg: No edema.  Skin:    General: Skin is warm.     Capillary Refill: Capillary refill takes less than 2 seconds.  Neurological:     General: No focal deficit present.     Mental Status: He is alert and oriented to person, place, and time.  Psychiatric:        Mood and Affect: Mood normal.        Behavior: Behavior normal.        Thought Content: Thought content normal.        Judgment: Judgment normal.     UC Treatments / Results  Labs (all labs ordered are listed, but only abnormal results are displayed) Labs Reviewed - No data to display  EKG   Radiology No results found.  Procedures Procedures (including critical care time)  Medications Ordered in UC Medications - No data to display  Initial Impression / Assessment and Plan / UC Course  I have reviewed the triage vital signs and the nursing notes.  Pertinent labs & imaging results that were available during my care of the patient were reviewed by me  and considered in my medical decision making (see chart for details).     This patient is a very pleasant 66 y.o. year old male presenting with STEMI. Afebrile, tachycardic. No current chest pain, but pain throughout the night. Took a baby aspirin 2 hours ago. Only current cardiac diagnosis is hypertension; taking antihypertensives as directed.   EKG with STEMI, changed from 09/10/21 EKG.   We immediately started patient on an IV and administered aspirin. EMS was called, who initiated code STEMI.    Level 5 for acute illness that poses a threat to life and bodily function and decision regarding hospitalization.   Final Clinical Impressions(s) / UC Diagnoses   Final diagnoses:  ST elevation myocardial infarction (STEMI), unspecified artery [facility])   Discharge Instructions   None    ED Prescriptions   None    PDMP not reviewed this encounter.   Hazel Sams, PA-C 10/22/21 [phone]

## 2021-10-22 NOTE — ED Provider Notes (Signed)
[facility] EMERGENCY DEPARTMENT Provider Note   CSN: [id number] Arrival date & time: 10/22/21  3235     History  Chief Complaint  Patient presents with   Chest Pain    Philip Gates is a 66 y.o. male.   Chest Pain Associated symptoms: cough   Patient presents for chest pain.  Prior to arrival in the ED, he was seen in urgent care.  Today, he initially presented for cough/congestion for the past 6 days.  While at urgent care, he mentioned that he left-sided his chest hurt yesterday.  This pain had resolved and he has not had any chest pain today.  Because he mentioned the chest pain, an EKG was obtained at urgent care.  Dr. Moshe Salisbury felt that the EKG was concerning for an acute STEMI.  He was given ASA and sent to the emergency department.  His medical history is notable for COPD, asthma, GERD. On arrival, patient endorses continued congestion with cough and absence of chest pain. HPI: A 66 year old patient with a history of hypertension and obesity presents for evaluation of chest pain. Initial onset of pain was more than 6 hours ago. The patient's chest pain is described as heaviness/pressure/tightness and is not worse with exertion. The patient's chest pain is middle- or left-sided, is not well-localized, is not sharp and does not radiate to the arms/jaw/neck. The patient does not complain of nausea and denies diaphoresis. The patient has no history of stroke, has no history of peripheral artery disease, has not smoked in the past 90 days, denies any history of treated diabetes, has no relevant family history of coronary artery disease (first degree relative at less than age 51) and has no history of hypercholesterolemia.   Home Medications Prior to Admission medications   Medication Sig Start Date End Date Taking? Authorizing Provider  benzonatate (TESSALON) 100 MG capsule Take 1 capsule (100 mg total) by mouth 3 (three) times daily as needed for up to 12 doses for cough. 10/22/21  Yes  Godfrey Pick, MD  doxycycline (VIBRAMYCIN) 100 MG capsule Take 1 capsule (100 mg total) by mouth 2 (two) times daily for 5 days. 10/22/21 10/27/21 Yes Godfrey Pick, MD  predniSONE (DELTASONE) 10 MG tablet Take 4 tablets (40 mg total) by mouth daily for 5 days. 10/22/21 10/27/21 Yes Godfrey Pick, MD  Ascorbic Acid (VITAMIN C) 1000 MG tablet Take 1,000 mg by mouth in the morning and at bedtime.    [provider]  aspirin EC 81 MG tablet Take 81 mg by mouth in the morning. Swallow whole.    [provider]  atorvastatin (LIPITOR) 10 MG tablet Take 10 mg by mouth. Take Monday,Wednesday and Friday evening 08/16/21   [provider]  BREZTRI AEROSPHERE 160-9-4.8 MCG/ACT AERO Inhale 1 puff into the lungs in the morning and at bedtime. 11/16/20   [provider]  cyclobenzaprine (FLEXERIL) 5 MG tablet Take 1 tablet (5 mg total) by mouth 3 (three) times daily as needed for muscle spasms. 12/08/20   Meyran, Ocie Cornfield, NP  dexlansoprazole (DEXILANT) 60 MG capsule Take 60 mg by mouth every morning.     [provider]  Dutasteride-Tamsulosin HCl 0.5-0.4 MG CAPS Take 1 capsule by mouth in the morning.    [provider]  famotidine (PEPCID) 20 MG tablet Take 20 mg by mouth in the morning and at bedtime. 11/06/20   [provider]  Homeopathic Products (MUSCLE CRAMP COMPLEX PO) Take 1 capsule by mouth in the morning  and at bedtime. Cramp Defense Magnesium for Leg Cramps, Muscle Cramps & Muscle Spasms.    [provider]  IBU 800 MG tablet Take 800 mg by mouth every 8 (eight) hours as needed (pain). 11/16/20   [provider]  ipratropium (ATROVENT) 0.03 % nasal spray Place into both nostrils. 07/24/21   [provider]  lamoTRIgine (LAMICTAL) 200 MG tablet Take 1 tablet (200 mg total) by mouth at bedtime. 08/14/21   Cloria Spring, MD  lisdexamfetamine (VYVANSE) 70 MG capsule Take 1 capsule (70 mg total) by mouth daily. 08/14/21    Cloria Spring, MD  metoprolol succinate (TOPROL-XL) 50 MG 24 hr tablet Take 50 mg by mouth at bedtime. 11/16/20   [provider]  Misc Natural Product Nasal (NASAL CLEANSE RINSE MIX) PACK Place 1 Dose into the nose daily. W/Azithromycin    [provider]  Misc Natural Products (PROSTATE HEALTH) CAPS Take 1 capsule by mouth in the morning and at bedtime.    [provider]  mometasone (ELOCON) 0.1 % cream Apply 1 application topically daily as needed (skin irritation). 10/15/20   [provider]  Multiple Vitamins-Minerals (MULTIVITAMIN WITH MINERALS) tablet Take 1 tablet by mouth daily. Multivitamin for Women (needs the iron)    [provider]  PROAIR HFA 108 (90 Base) MCG/ACT inhaler Inhale 1-2 puffs into the lungs every 6 (six) hours as needed for shortness of breath or wheezing. 10/21/17   [provider]  Probiotic Product (DIGESTIVE ADV MULTI-STRAIN PO) Take 1 capsule by mouth in the morning and at bedtime.    [provider]  promethazine (PHENERGAN) 25 MG tablet Take 25 mg by mouth every 6 (six) hours as needed for nausea or vomiting.    [provider]  tadalafil (CIALIS) 20 MG tablet Take 20 mg by mouth at bedtime as needed for erectile dysfunction.    [provider]  telmisartan-hydrochlorothiazide (MICARDIS HCT) 80-12.5 MG tablet Take 1 tablet by mouth daily. 08/23/21   [provider]  testosterone cypionate (DEPOTESTOTERONE CYPIONATE) 200 MG/ML injection Inject 200 mg into the muscle every 14 (fourteen) days.  12/16/14   [provider]      Allergies    Naltrexone, Clindamycin, Iodine, Erythromycin, and Penicillins    Review of Systems   Review of Systems  HENT:  Positive for congestion.   Respiratory:  Positive for cough, chest tightness and wheezing.   Cardiovascular:  Positive for chest pain.  All other systems reviewed and are negative.  Physical Exam Updated Vital Signs BP  100/62    Pulse 98    Temp 98.9 F (37.2 C) (Oral)    Resp 20    Ht 5\' 11"  (1.803 m)    Wt 99.3 kg    SpO2 94%    BMI 30.54 kg/m  Physical Exam Vitals and nursing note reviewed.  Constitutional:      General: He is not in acute distress.    Appearance: He is well-developed. He is not ill-appearing, toxic-appearing or diaphoretic.  HENT:     Head: Normocephalic and atraumatic.  Eyes:     Extraocular Movements: Extraocular movements intact.     Conjunctiva/sclera: Conjunctivae normal.  Neck:     Vascular: No JVD.  Cardiovascular:     Rate and Rhythm: Normal rate and regular rhythm.     Heart sounds: No murmur heard. Pulmonary:     Effort: Pulmonary effort is normal. No accessory muscle usage or respiratory distress.  Breath sounds: Normal breath sounds. No decreased breath sounds or rales.     Comments: Coarse breath sounds Chest:     Chest wall: No tenderness or crepitus.  Abdominal:     Palpations: Abdomen is soft.     Tenderness: There is no abdominal tenderness.  Musculoskeletal:        General: No swelling. Normal range of motion.     Cervical back: Normal range of motion and neck supple.     Right lower leg: No edema.     Left lower leg: No edema.  Skin:    General: Skin is warm and dry.     Capillary Refill: Capillary refill takes less than 2 seconds.     Coloration: Skin is not cyanotic or pale.  Neurological:     General: No focal deficit present.     Mental Status: He is alert and oriented to person, place, and time.  Psychiatric:        Mood and Affect: Mood normal.        Behavior: Behavior normal.    ED Results / Procedures / Treatments   Labs (all labs ordered are listed, but only abnormal results are displayed) Labs Reviewed  COMPREHENSIVE METABOLIC PANEL - Abnormal; Notable for the following components:      Result Value   Glucose, Bld 120 (*)    Total Protein 6.4 (*)    ALT 45 (*)    All other components within normal limits  CBC WITH  DIFFERENTIAL/PLATELET - Abnormal; Notable for the following components:   RBC 3.90 (*)    HCT 38.7 (*)    MCH 34.1 (*)    All other components within normal limits  RESP PANEL BY RT-PCR (FLU A&B, COVID) ARPGX2  MAGNESIUM  LIPASE, BLOOD  PROTIME-INR  TROPONIN I (HIGH SENSITIVITY)  TROPONIN I (HIGH SENSITIVITY)    EKG EKG Interpretation  Date/Time:  Tuesday October 22 2021 09:04:38 EST Ventricular Rate:  106 PR Interval:  126 QRS Duration: 89 QT Interval:  323 QTC Calculation: 429 R Axis:   41 Text Interpretation: Sinus tachycardia Confirmed by Godfrey Pick ([id number]) on 10/22/2021 11:05:04 AM  Radiology No results found.  Procedures Procedures    Medications Ordered in ED Medications - No data to display  ED Course/ Medical Decision Making/ A&P   HEAR Score: 3                       Medical Decision Making Amount and/or Complexity of Data Reviewed Labs: ordered. Radiology: ordered.  Risk Prescription drug management.   Patient is a 66 year old male who was was sent to the emergency department from urgent care due to concern of STEMI.  Patient, himself, reports that he has had cough and congestion over the past 6 days that are consistent with prior episodes of asthma/COPD exacerbation.  He has been utilizing home nebulized breathing treatments.  He went to the urgent care to obtain medication to help with this exacerbation.  While at urgent care, he mentioned that he had chest pain on the left side yesterday.  He denies any chest pain today.  EKG obtained at urgent care is not consistent with STEMI.  There were some artifacts in the inferior leads that caused the computer to identify STEMI.  EKG was obtained upon his arrival in the emergency department and showed no evidence of ischemia.  Due to concerns at urgent care, he did undergo chest pain work-up in the emergency department.  Troponins were normal.  He had no recurrence of chest pain while in the emergency department. He  has a heart score of 3. I do not feel he needs any further cardiac workup.  Current breathing is unlabored.  His SPO2 is normal on room air.  He has no appreciable wheezing on lung auscultation.  Due to his symptoms over the past 6 days, he was prescribed prednisone and doxycycline for asthma/COPD exacerbation.  He was discharged in good condition.        Final Clinical Impression(s) / ED Diagnoses Final diagnoses:  Chest congestion    Rx / DC Orders ED Discharge Orders          Ordered    predniSONE (DELTASONE) 10 MG tablet  Daily        10/22/21 1247    doxycycline (VIBRAMYCIN) 100 MG capsule  2 times daily        10/22/21 1247    benzonatate (TESSALON) 100 MG capsule  3 times daily PRN        10/22/21 1247              Godfrey Pick, MD 10/24/21 1154

## 2021-10-22 NOTE — ED Triage Notes (Signed)
Pt presents with nasal congestion that began on Wednesday but then states he developed left side chest pain yesterday that he described as sharp intermittent pain, currently denies pain

## 2021-11-08 ENCOUNTER — Other Ambulatory Visit: Payer: Self-pay

## 2021-11-08 ENCOUNTER — Encounter (HOSPITAL_COMMUNITY): Payer: Self-pay | Admitting: Psychiatry

## 2021-11-08 ENCOUNTER — Telehealth (INDEPENDENT_AMBULATORY_CARE_PROVIDER_SITE_OTHER): Payer: PPO | Admitting: Psychiatry

## 2021-11-08 DIAGNOSIS — F900 Attention-deficit hyperactivity disorder, predominantly inattentive type: Secondary | ICD-10-CM

## 2021-11-08 DIAGNOSIS — F3162 Bipolar disorder, current episode mixed, moderate: Secondary | ICD-10-CM

## 2021-11-08 MED ORDER — LAMOTRIGINE 200 MG PO TABS: 200.0000 mg | ORAL_TABLET | Freq: Every day | ORAL | 2 refills | Status: DC

## 2021-11-08 MED ORDER — LISDEXAMFETAMINE DIMESYLATE 70 MG PO CAPS: 70.0000 mg | ORAL_CAPSULE | Freq: Every day | ORAL | 0 refills | Status: DC

## 2021-11-08 NOTE — Progress Notes (Signed)
Virtual Visit via Telephone Note  I connected with Philip Gates on 11/08/21 at 10:00 AM EST by telephone and verified that I am speaking with the correct person using two identifiers.  Location: Patient: home Provider: home office   I discussed the limitations, risks, security and privacy concerns of performing an evaluation and management service by telephone and the availability of in person appointments. I also discussed with the patient that there may be a patient responsible charge related to this service. The patient expressed understanding and agreed to proceed.       I discussed the assessment and treatment plan with the patient. The patient was provided an opportunity to ask questions and all were answered. The patient agreed with the plan and demonstrated an understanding of the instructions.   The patient was advised to call back or seek an in-person evaluation if the symptoms worsen or if the condition fails to improve as anticipated.  I provided 20 minutes of non-face-to-face time during this encounter.   [name], MD  [facility] MD/PA/NP OP Progress Note  11/08/2021 10:21 AM Philip Gates  MRN:  [id number]  Chief Complaint:  Chief Complaint  Patient presents with   Manic Behavior   ADHD   Follow-up   HPI: This patient is a 66 year old married white male who lives with his wife in [place].  He used to work in [employer], [employer] but has been retired for several years.  The patient returns for follow-up after 3 months regarding his mood swings and ADHD.  I noted that he was hospitalized in January.  This was after he was prescribed naltrexone for weight loss.  After taking 1 dose he got very agitated.  It could be that this caused narcotic withdrawal since he takes Norco.  He also admits he has been drinking "a little bit and claims he is no longer drinking.  He had to be admitted to the hospital for sedation but claims he is doing much better  now.  The patient denies symptoms of agitation anger irritability.  He is focusing well with the Vyvanse.  He is adamant that he is no longer drinking. Visit Diagnosis:    ICD-10-CM   1. Attention deficit hyperactivity disorder (ADHD), predominantly inattentive type  F90.0     2. 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   Venous insufficiency     Past Surgical History:  Procedure Laterality Date   cerebral stent     COLONOSCOPY N/A 01/02/2017   Procedure: COLONOSCOPY;  Surgeon: Rogene Houston, MD;  [facility];  Service: Endoscopy;  Laterality: N/A;   dental implant  09/26/2014   upper and lower dental implant/bridges- permanent   ESOPHAGOGASTRODUODENOSCOPY N/A 01/02/2017   Procedure: ESOPHAGOGASTRODUODENOSCOPY (EGD);  Surgeon: Rogene Houston, MD;  [facility];  Service: Endoscopy;  Laterality: N/A;  910   EYE SURGERY Left Jan. 16, 2017   Cataract   IR ANGIO INTRA EXTRACRAN SEL INTERNAL CAROTID BILAT MOD SED  01/20/2017   IR ANGIO VERTEBRAL SEL VERTEBRAL UNI L MOD SED  01/20/2017   JOINT REPLACEMENT Right    Knee   JOINT REPLACEMENT Left    Hip   NOSE SURGERY     RADIOLOGY WITH  ANESTHESIA N/A 12/29/2014   Procedure: Embolization;  Surgeon: Consuella Lose, MD;  Location: [place] [place];  Service: Radiology;  Laterality: N/A;   SUBCLAVIAN ANGIOGRAM  Nov. 8, 2016   TOTAL HIP ARTHROPLASTY Left 01/03/2013   Procedure: TOTAL HIP ARTHROPLASTY ANTERIOR APPROACH;  Surgeon: Gearlean Alf, MD;  [facility];  Service: Orthopedics;  Laterality: Left;   TOTAL KNEE ARTHROPLASTY Right 10/06/2014   Procedure: RIGHT TOTAL KNEE ARTHROPLASTY;  Surgeon: Gearlean Alf, MD;  [facility];  Service: Orthopedics;  Laterality: Right;   WISDOM TOOTH EXTRACTION      Family Psychiatric History: see below  Family History:  Family History  Problem Relation Age of Onset   Arrhythmia Mother        Has pacemaker   Hypertension Mother    Heart disease Mother    Mental illness Mother    Varicose Veins Mother    Depression Mother    Colon cancer Father    Cancer Father        Prostate and Colon   Diabetes Father    Hypertension Father    Cancer - Colon Father    Depression Sister     Social History:  Social History   Socioeconomic History   Marital status: Married    Spouse name: Not on file   Number of children: Not on file   Years of education: Not on file   Highest education level: Not on file  Occupational History   Not on file  Tobacco Use   Smoking status: Former    Packs/day: 0.50    Types: Cigarettes    Start date: 05/23/2015    Quit date: 10/09/2017    Years since quitting: 4.0   Smokeless tobacco: Former    Types: Snuff   Tobacco comments:    Stated "I smoke off and on"  Vaping Use   Vaping Use: Never used  Substance and Sexual Activity   Alcohol use: No    Comment: last had fireball last night 09/10/21   Drug use: No   Sexual activity: Yes    Partners: Female  Other Topics Concern   Not on file  Social History Narrative   Not on file   Social Determinants of Health   Financial Resource Strain: Not on file  Food Insecurity: Not on file   Transportation Needs: Not on file  Physical Activity: Not on file  Stress: Not on file  Social Connections: Not on file    Allergies:  Allergies  Allergen Reactions   Naltrexone Anaphylaxis   Clindamycin Other (See Comments)    Abdominal pain "twisted stomach" Abdominal pain "twisted stomach" Abdominal pain "twisted stomach" Abdominal pain "twisted stomach" Abdominal pain "twisted stomach"   Iodine Other (See Comments)  Erythromycin     Caused a "twisted stomach"   Penicillins Rash    Metabolic Disorder Labs: No results found for: HGBA1C, MPG No results found for: PROLACTIN No results found for: CHOL, TRIG, HDL, CHOLHDL, VLDL, LDLCALC No results found for: TSH  Therapeutic Level Labs: No results found for: LITHIUM No results found for: VALPROATE No components found for:  CBMZ  Current Medications: Current Outpatient Medications  Medication Sig Dispense Refill   HYDROcodone-acetaminophen (NORCO) 10-325 MG tablet TAKE 1 TABLET EVERY 4 HOURS AS NEEDED     lisdexamfetamine (VYVANSE) 70 MG capsule Take 1 capsule (70 mg total) by mouth daily. 30 capsule 0   lisdexamfetamine (VYVANSE) 70 MG capsule Take 1 capsule (70 mg total) by mouth daily. 30 capsule 0   Ascorbic Acid (VITAMIN C) 1000 MG tablet Take 1,000 mg by mouth in the morning and at bedtime.     aspirin EC 81 MG tablet Take 81 mg by mouth in the morning. Swallow whole.     atorvastatin (LIPITOR) 10 MG tablet Take 10 mg by mouth. Take Monday,Wednesday and Friday evening     benzonatate (TESSALON) 100 MG capsule Take 1 capsule (100 mg total) by mouth 3 (three) times daily as needed for up to 12 doses for cough. 12 capsule 0   BREZTRI AEROSPHERE 160-9-4.8 MCG/ACT AERO Inhale 1 puff into the lungs in the morning and at bedtime.     cyclobenzaprine (FLEXERIL) 5 MG tablet Take 1 tablet (5 mg total) by mouth 3 (three) times daily as needed for muscle spasms. 30 tablet 0   dexlansoprazole (DEXILANT) 60 MG capsule Take 60 mg  by mouth every morning.      Dutasteride-Tamsulosin HCl 0.5-0.4 MG CAPS Take 1 capsule by mouth in the morning.     famotidine (PEPCID) 20 MG tablet Take 20 mg by mouth in the morning and at bedtime.     Homeopathic Products (MUSCLE CRAMP COMPLEX PO) Take 1 capsule by mouth in the morning and at bedtime. Cramp Defense Magnesium for Leg Cramps, Muscle Cramps & Muscle Spasms.     IBU 800 MG tablet Take 800 mg by mouth every 8 (eight) hours as needed (pain).     ipratropium (ATROVENT) 0.03 % nasal spray Place into both nostrils.     lamoTRIgine (LAMICTAL) 200 MG tablet Take 1 tablet (200 mg total) by mouth at bedtime. 30 tablet 2   lisdexamfetamine (VYVANSE) 70 MG capsule Take 1 capsule (70 mg total) by mouth daily. 30 capsule 0   metoprolol succinate (TOPROL-XL) 50 MG 24 hr tablet Take 50 mg by mouth at bedtime.     Misc Natural Product Nasal (NASAL CLEANSE RINSE MIX) PACK Place 1 Dose into the nose daily. W/Azithromycin     Misc Natural Products (PROSTATE HEALTH) CAPS Take 1 capsule by mouth in the morning and at bedtime.     mometasone (ELOCON) 0.1 % cream Apply 1 application topically daily as needed (skin irritation).     Multiple Vitamins-Minerals (MULTIVITAMIN WITH MINERALS) tablet Take 1 tablet by mouth daily. Multivitamin for Women (needs the iron)     PROAIR HFA 108 (90 Base) MCG/ACT inhaler Inhale 1-2 puffs into the lungs every 6 (six) hours as needed for shortness of breath or wheezing.     Probiotic Product (DIGESTIVE ADV MULTI-STRAIN PO) Take 1 capsule by mouth in the morning and at bedtime.     promethazine (PHENERGAN) 25 MG tablet Take 25 mg by mouth every 6 (six) hours as needed for  nausea or vomiting.     tadalafil (CIALIS) 20 MG tablet Take 20 mg by mouth at bedtime as needed for erectile dysfunction.     telmisartan-hydrochlorothiazide (MICARDIS HCT) 80-12.5 MG tablet Take 1 tablet by mouth daily.     testosterone cypionate (DEPOTESTOTERONE CYPIONATE) 200 MG/ML injection Inject 200  mg into the muscle every 14 (fourteen) days.      No current facility-administered medications for this visit.     Musculoskeletal: Strength & Muscle Tone: na Gait & Station: na Patient leans: N/A  Psychiatric Specialty Exam: Review of Systems  Musculoskeletal:  Positive for back pain.  All other systems reviewed and are negative.  There were no vitals taken for this visit.There is no height or weight on file to calculate BMI.  General Appearance: NA  Eye Contact:  NA  Speech:  Clear and Coherent  Volume:  Normal  Mood:  Euthymic  Affect:  NA  Thought Process:  Goal Directed  Orientation:  Full (Time, Place, and Person)  Thought Content: WDL   Suicidal Thoughts:  No  Homicidal Thoughts:  No  Memory:  Immediate;   Good Recent;   Good Remote;   Good  Judgement:  Good  Insight:  Fair  Psychomotor Activity:  Normal  Concentration:  Concentration: Good and Attention Span: Good  Recall:  Good  Fund of Knowledge: Good  Language: Good  Akathisia:  No  Handed:  Right  AIMS (if indicated): not done  Assets:  Communication Skills Desire for Improvement Physical Health Resilience Social Support Talents/Skills  ADL's:  Intact  Cognition: WNL  Sleep:  Good   Screenings: PHQ2-9    Flowsheet Row Video Visit from 08/14/2021 in [facility] Video Visit from 05/20/2021 in [facility]  PHQ-2 Total Score 0 0      Flowsheet Row ED from 10/22/2021 in [facility] to Hosp-Admission (Discharged) from 09/10/2021 in [facility] from 08/27/2021 in [facility] [place] No Risk No Risk No Risk        Assessment and Plan: This patient is a 66 year old male with a history of bipolar disorder and ADHD.  He continues to do well and his mood is stable.  I am concerned about what happened in January with the narcotic  withdrawal and possible alcohol use.  However he is adamant that he does not drink and only uses the narcotics sparingly.  For now he will continue Lamictal 200 mg daily at bedtime for mood stabilization and Vyvanse 70 mg every morning for ADHD.  He will return to see me in 3 months  Collaboration of Care: Collaboration of Care: Primary Care Provider AEB chart notes will be provided to PCP at patient's request  Patient/Guardian was advised Release of Information must be obtained prior to any record release in order to collaborate their care with an outside provider. Patient/Guardian was advised if they have not already done so to contact the registration department to sign all necessary forms in order for [place] to release information regarding their care.   Consent: Patient/Guardian gives verbal consent for treatment and assignment of benefits for services provided during this visit. Patient/Guardian expressed understanding and agreed to proceed.    [name], MD 11/08/2021, 10:21 AM

## 2022-01-01 ENCOUNTER — Encounter (INDEPENDENT_AMBULATORY_CARE_PROVIDER_SITE_OTHER): Payer: Self-pay

## 2022-01-02 ENCOUNTER — Ambulatory Visit (INDEPENDENT_AMBULATORY_CARE_PROVIDER_SITE_OTHER): Payer: PPO | Admitting: Ophthalmology

## 2022-01-02 ENCOUNTER — Encounter (INDEPENDENT_AMBULATORY_CARE_PROVIDER_SITE_OTHER): Payer: Self-pay | Admitting: Ophthalmology

## 2022-01-02 DIAGNOSIS — Z9889 Other specified postprocedural states: Secondary | ICD-10-CM | POA: Diagnosis not present

## 2022-01-02 DIAGNOSIS — H43311 Vitreous membranes and strands, right eye: Secondary | ICD-10-CM | POA: Diagnosis not present

## 2022-01-02 NOTE — Progress Notes (Signed)
? ? ?01/02/2022 ? ?  ? ?CHIEF COMPLAINT ?Patient presents for  ?Chief Complaint  ?Patient presents with  ? Retina Evaluation  ? ? ? ? ?HISTORY OF PRESENT ILLNESS: ?Philip Gates is a 66 y.o. male who presents to the clinic today for:  ? ?HPI   ? ? Retina Evaluation   ? ?      ? Laterality: right eye  ? Associated Symptoms: Floaters.  Negative for Flashes  ? ?  ?  ? ? Comments   ?NP- new floaters OCT FP, referred by Dr. Midge Aver. ?Patient states "I saw Dr. Katy Fitch yesterday. Dr. Katy Fitch removed the one's on the left eye, but there is a big floater in my right eye. It showed up 2 month or a month ago. Right before I had cataract surgery. I had cataract surgery a in the right eye a little over a month a go. There are a lot of floaters, just one big one and a lot of little bitty ones." ?Patient is not using any prescription eye drops. ?Patient reports January 1st, he was hospitalized for an allergic reaction, Naltrexone. ? ? ?  ?  ?Last edited by Laurin Coder on 01/02/2022  9:08 AM.  ?  ? ? ?Referring physician: ?Warden Fillers, MD ?[address] ?[address] ?[place],  [place] [id number] ? ?HISTORICAL INFORMATION:  ? ?Selected notes from the [place] ?  ?   ? ?CURRENT MEDICATIONS: ?No current outpatient medications on file. (Ophthalmic Drugs)  ? ?No current facility-administered medications for this visit. (Ophthalmic Drugs)  ? ?Current Outpatient Medications (Other)  ?Medication Sig  ? Ascorbic Acid (VITAMIN C) 1000 MG tablet Take 1,000 mg by mouth in the morning and at bedtime.  ? aspirin EC 81 MG tablet Take 81 mg by mouth in the morning. Swallow whole.  ? atorvastatin (LIPITOR) 10 MG tablet Take 10 mg by mouth. Take Monday,Wednesday and Friday evening  ? benzonatate (TESSALON) 100 MG capsule Take 1 capsule (100 mg total) by mouth 3 (three) times daily as needed for up to 12 doses for cough.  ? BREZTRI AEROSPHERE 160-9-4.8 MCG/ACT AERO Inhale 1 puff into the lungs in the morning and at bedtime.  ?  cyclobenzaprine (FLEXERIL) 5 MG tablet Take 1 tablet (5 mg total) by mouth 3 (three) times daily as needed for muscle spasms.  ? dexlansoprazole (DEXILANT) 60 MG capsule Take 60 mg by mouth every morning.   ? Dutasteride-Tamsulosin HCl 0.5-0.4 MG CAPS Take 1 capsule by mouth in the morning.  ? famotidine (PEPCID) 20 MG tablet Take 20 mg by mouth in the morning and at bedtime.  ? Homeopathic Products (MUSCLE CRAMP COMPLEX PO) Take 1 capsule by mouth in the morning and at bedtime. Cramp Defense Magnesium for Leg Cramps, Muscle Cramps & Muscle Spasms.  ? IBU 800 MG tablet Take 800 mg by mouth every 8 (eight) hours as needed (pain).  ? ipratropium (ATROVENT) 0.03 % nasal spray Place into both nostrils.  ? lamoTRIgine (LAMICTAL) 200 MG tablet Take 1 tablet (200 mg total) by mouth at bedtime.  ? lisdexamfetamine (VYVANSE) 70 MG capsule Take 1 capsule (70 mg total) by mouth daily.  ? lisdexamfetamine (VYVANSE) 70 MG capsule Take 1 capsule (70 mg total) by mouth daily.  ? lisdexamfetamine (VYVANSE) 70 MG capsule Take 1 capsule (70 mg total) by mouth daily.  ? metoprolol succinate (TOPROL-XL) 50 MG 24 hr tablet Take 50 mg by mouth at bedtime.  ? Misc Natural Product Nasal (  NASAL CLEANSE RINSE MIX) PACK Place 1 Dose into the nose daily. W/Azithromycin  ? Misc Natural Products (PROSTATE HEALTH) CAPS Take 1 capsule by mouth in the morning and at bedtime.  ? mometasone (ELOCON) 0.1 % cream Apply 1 application topically daily as needed (skin irritation).  ? Multiple Vitamins-Minerals (MULTIVITAMIN WITH MINERALS) tablet Take 1 tablet by mouth daily. Multivitamin for Women (needs the iron)  ? PROAIR HFA 108 (90 Base) MCG/ACT inhaler Inhale 1-2 puffs into the lungs every 6 (six) hours as needed for shortness of breath or wheezing.  ? Probiotic Product (DIGESTIVE ADV MULTI-STRAIN PO) Take 1 capsule by mouth in the morning and at bedtime.  ? promethazine (PHENERGAN) 25 MG tablet Take 25 mg by mouth every 6 (six) hours as needed for  nausea or vomiting.  ? tadalafil (CIALIS) 20 MG tablet Take 20 mg by mouth at bedtime as needed for erectile dysfunction.  ? telmisartan-hydrochlorothiazide (MICARDIS HCT) 80-12.5 MG tablet Take 1 tablet by mouth daily.  ? testosterone cypionate (DEPOTESTOTERONE CYPIONATE) 200 MG/ML injection Inject 200 mg into the muscle every 14 (fourteen) days.   ? ?No current facility-administered medications for this visit. (Other)  ? ? ? ? ?REVIEW OF SYSTEMS: ?ROS   ?Negative for: Constitutional, Gastrointestinal, Neurological, Skin, Genitourinary, Musculoskeletal, HENT, Endocrine, Cardiovascular, Eyes, Respiratory, Psychiatric, Allergic/Imm, Heme/Lymph ?Last edited by Hurman Horn, MD on 01/02/2022 10:00 AM.  ?  ? ? ? ?ALLERGIES ?Allergies  ?Allergen Reactions  ? Naltrexone Anaphylaxis  ? Clindamycin Other (See Comments)  ?  Abdominal pain "twisted stomach" ?Abdominal pain "twisted stomach" ?Abdominal pain "twisted stomach" ?Abdominal pain "twisted stomach" ?Abdominal pain "twisted stomach"  ? Iodine Other (See Comments)  ? Erythromycin   ?  Caused a "twisted stomach"  ? Penicillins Rash  ? ? ?PAST MEDICAL HISTORY ?Past Medical History:  ?Diagnosis Date  ? Acid reflux   ? ADHD   ? Aneurysm ([place])   ? 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 Arthritis   ? Asthma   ? ACTIVITY INDUCED  ? Bipolar 1 disorder ([place])   ? BPH (benign prostatic hyperplasia)   ? Cellulitis 05/2014  ? right side face  ? Cerebral aneurysm   ? stent placement  ? COPD (chronic obstructive pulmonary disease) ([place])   ? Dyspnea   ? occaional with exertion  ? GERD (gastroesophageal reflux disease)   ? Hearing loss   ? bilateral - no hearing aids  ? Hypertension   ? Neck pain, chronic   ? Sinus drainage   ? Varicose veins   ? 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  ? Venous insufficiency   ? ?Past Surgical History:  ?Procedure Laterality Date  ? cerebral stent    ? COLONOSCOPY N/A 01/02/2017  ? Procedure:  COLONOSCOPY;  Surgeon: Rogene Houston, MD;  [facility];  Service: Endoscopy;  Laterality: N/A;  ? dental implant  09/26/2014  ? upper and lower dental implant/bridges- permanent  ? ESOPHAGOGASTRODUODENOSCOPY N/A 01/02/2017  ? Procedure: ESOPHAGOGASTRODUODENOSCOPY (EGD);  Surgeon: Rogene Houston, MD;  [facility];  Service: Endoscopy;  Laterality: N/A;  910  ? EYE SURGERY Left Jan. 16, 2017  ? Cataract  ? IR ANGIO INTRA EXTRACRAN SEL INTERNAL CAROTID BILAT MOD SED  01/20/2017  ? IR ANGIO VERTEBRAL SEL VERTEBRAL UNI L MOD SED  01/20/2017  ? JOINT REPLACEMENT Right   ? Knee  ? JOINT REPLACEMENT Left   ? Hip  ?  NOSE SURGERY    ? RADIOLOGY WITH ANESTHESIA N/A 12/29/2014  ? Procedure: Embolization;  Surgeon: Consuella Lose, MD;  Location: [facility];  Service: Radiology;  Laterality: N/A;  ? SUBCLAVIAN ANGIOGRAM  Nov. 8, 2016  ? TOTAL HIP ARTHROPLASTY Left 01/03/2013  ? Procedure: TOTAL HIP ARTHROPLASTY ANTERIOR APPROACH;  Surgeon: Gearlean Alf, MD;  [facility];  Service: Orthopedics;  Laterality: Left;  ? TOTAL KNEE ARTHROPLASTY Right 10/06/2014  ? Procedure: RIGHT TOTAL KNEE ARTHROPLASTY;  Surgeon: Gearlean Alf, MD;  [facility];  Service: Orthopedics;  Laterality: Right;  ? WISDOM TOOTH EXTRACTION    ? ? ?FAMILY HISTORY ?Family History  ?Problem Relation Age of Onset  ? Arrhythmia Mother   ?     Has pacemaker  ? Hypertension Mother   ? Heart disease Mother   ? Mental illness Mother   ? Varicose Veins Mother   ? Depression Mother   ? Colon cancer Father   ? Cancer Father   ?     Prostate and Colon  ? Diabetes Father   ? Hypertension Father   ? Cancer - Colon Father   ? Depression Sister   ? ? ?SOCIAL HISTORY ?Social History  ? ?Tobacco Use  ? Smoking status: Former  ?  Packs/day: 0.50  ?  Types: Cigarettes  ?  Start date: 05/23/2015  ?  Quit date: 10/09/2017  ?  Years since quitting: 4.2  ? Smokeless tobacco: Former  ?  Types: Snuff  ? Tobacco comments:  ?  Stated "I smoke off and on"   ?Vaping Use  ? Vaping Use: Never used  ?Substance Use Topics  ? Alcohol use: No  ?  Comment: last had fireball last night 09/10/21  ? Drug use: No  ? ?  ? ?  ? ?OPHTHALMIC EXAM: ? ?Base Eye Exam   ? ? Visual Acuity (ETDR

## 2022-01-02 NOTE — Assessment & Plan Note (Signed)
OD now pseudophakic, patient complains of continued vitreous strands and floaters that are impacting his quality of life and vision. ? ?Now that pseudophakia is present and his symptoms have not improved he would like to embark upon vitrectomy in the right eye to clear the vitreous haze and membranes and strands ?

## 2022-01-20 ENCOUNTER — Ambulatory Visit (INDEPENDENT_AMBULATORY_CARE_PROVIDER_SITE_OTHER): Payer: PPO

## 2022-01-20 ENCOUNTER — Encounter (INDEPENDENT_AMBULATORY_CARE_PROVIDER_SITE_OTHER): Payer: Self-pay

## 2022-01-20 MED ORDER — PREDNISOLONE ACETATE 1 % OP SUSP: 1.0000 [drp] | Freq: Four times a day (QID) | OPHTHALMIC | 0 refills | Status: AC

## 2022-01-20 MED ORDER — OFLOXACIN 0.3 % OP SOLN: 1.0000 [drp] | Freq: Four times a day (QID) | OPHTHALMIC | 0 refills | Status: AC

## 2022-01-20 NOTE — Progress Notes (Signed)
? ? ?01/20/2022 ? ?  ? ?CHIEF COMPLAINT ?Patient presents for Pre-op Exam ? ? ?HISTORY OF PRESENT ILLNESS: ?Philip Gates is a 66 y.o. male who presents to the clinic today for:  ? ?HPI   ?Pre op OD and OCT sx 01/29/2022. ?Patient states vision is stable and unchanged since last visit. Denies any new floaters or FOL. ? ?Last edited by Laurin Coder on 01/20/2022 10:46 AM.  ?  ? ? ? ? ?HISTORICAL INFORMATION:  ? ?Selected notes from the [place] ?  ?   ? ?CURRENT MEDICATIONS: ?No current outpatient medications on file. (Ophthalmic Drugs)  ? ?No current facility-administered medications for this visit. (Ophthalmic Drugs)  ? ?Current Outpatient Medications (Other)  ?Medication Sig  ? Ascorbic Acid (VITAMIN C) 1000 MG tablet Take 1,000 mg by mouth in the morning and at bedtime.  ? aspirin EC 81 MG tablet Take 81 mg by mouth in the morning. Swallow whole.  ? atorvastatin (LIPITOR) 10 MG tablet Take 10 mg by mouth. Take Monday,Wednesday and Friday evening  ? benzonatate (TESSALON) 100 MG capsule Take 1 capsule (100 mg total) by mouth 3 (three) times daily as needed for up to 12 doses for cough.  ? BREZTRI AEROSPHERE 160-9-4.8 MCG/ACT AERO Inhale 1 puff into the lungs in the morning and at bedtime.  ? cyclobenzaprine (FLEXERIL) 5 MG tablet Take 1 tablet (5 mg total) by mouth 3 (three) times daily as needed for muscle spasms.  ? dexlansoprazole (DEXILANT) 60 MG capsule Take 60 mg by mouth every morning.   ? Dutasteride-Tamsulosin HCl 0.5-0.4 MG CAPS Take 1 capsule by mouth in the morning.  ? famotidine (PEPCID) 20 MG tablet Take 20 mg by mouth in the morning and at bedtime.  ? Homeopathic Products (MUSCLE CRAMP COMPLEX PO) Take 1 capsule by mouth in the morning and at bedtime. Cramp Defense Magnesium for Leg Cramps, Muscle Cramps & Muscle Spasms.  ? IBU 800 MG tablet Take 800 mg by mouth every 8 (eight) hours as needed (pain).  ? ipratropium (ATROVENT) 0.03 % nasal spray Place into both nostrils.  ? lamoTRIgine  (LAMICTAL) 200 MG tablet Take 1 tablet (200 mg total) by mouth at bedtime.  ? lisdexamfetamine (VYVANSE) 70 MG capsule Take 1 capsule (70 mg total) by mouth daily.  ? lisdexamfetamine (VYVANSE) 70 MG capsule Take 1 capsule (70 mg total) by mouth daily.  ? lisdexamfetamine (VYVANSE) 70 MG capsule Take 1 capsule (70 mg total) by mouth daily.  ? metoprolol succinate (TOPROL-XL) 50 MG 24 hr tablet Take 50 mg by mouth at bedtime.  ? Misc Natural Product Nasal (NASAL CLEANSE RINSE MIX) PACK Place 1 Dose into the nose daily. W/Azithromycin  ? Misc Natural Products (PROSTATE HEALTH) CAPS Take 1 capsule by mouth in the morning and at bedtime.  ? mometasone (ELOCON) 0.1 % cream Apply 1 application topically daily as needed (skin irritation).  ? Multiple Vitamins-Minerals (MULTIVITAMIN WITH MINERALS) tablet Take 1 tablet by mouth daily. Multivitamin for Women (needs the iron)  ? PROAIR HFA 108 (90 Base) MCG/ACT inhaler Inhale 1-2 puffs into the lungs every 6 (six) hours as needed for shortness of breath or wheezing.  ? Probiotic Product (DIGESTIVE ADV MULTI-STRAIN PO) Take 1 capsule by mouth in the morning and at bedtime.  ? promethazine (PHENERGAN) 25 MG tablet Take 25 mg by mouth every 6 (six) hours as needed for nausea or vomiting.  ? tadalafil (CIALIS) 20 MG tablet Take 20 mg by mouth at bedtime  as needed for erectile dysfunction.  ? telmisartan-hydrochlorothiazide (MICARDIS HCT) 80-12.5 MG tablet Take 1 tablet by mouth daily.  ? testosterone cypionate (DEPOTESTOTERONE CYPIONATE) 200 MG/ML injection Inject 200 mg into the muscle every 14 (fourteen) days.   ? ?No current facility-administered medications for this visit. (Other)  ? ? ? ?ALLERGIES ?Allergies  ?Allergen Reactions  ? Naltrexone Anaphylaxis  ? Clindamycin Other (See Comments)  ?  Abdominal pain "twisted stomach" ?Abdominal pain "twisted stomach" ?Abdominal pain "twisted stomach" ?Abdominal pain "twisted stomach" ?Abdominal pain "twisted stomach"  ? Iodine Other  (See Comments)  ? Erythromycin   ?  Caused a "twisted stomach"  ? Penicillins Rash  ? ? ?PAST MEDICAL HISTORY ?Past Medical History:  ?Diagnosis Date  ? Acid reflux   ? ADHD   ? Aneurysm ([place])   ? 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 Arthritis   ? Asthma   ? ACTIVITY INDUCED  ? Bipolar 1 disorder ([place])   ? BPH (benign prostatic hyperplasia)   ? Cellulitis 05/2014  ? right side face  ? Cerebral aneurysm   ? stent placement  ? COPD (chronic obstructive pulmonary disease) ([place])   ? Dyspnea   ? occaional with exertion  ? GERD (gastroesophageal reflux disease)   ? Hearing loss   ? bilateral - no hearing aids  ? Hypertension   ? Neck pain, chronic   ? Sinus drainage   ? Varicose veins   ? 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  ? Venous insufficiency   ? ?Past Surgical History:  ?Procedure Laterality Date  ? cerebral stent    ? COLONOSCOPY N/A 01/02/2017  ? Procedure: COLONOSCOPY;  Surgeon: Rogene Houston, MD;  [facility];  Service: Endoscopy;  Laterality: N/A;  ? dental implant  09/26/2014  ? upper and lower dental implant/bridges- permanent  ? ESOPHAGOGASTRODUODENOSCOPY N/A 01/02/2017  ? Procedure: ESOPHAGOGASTRODUODENOSCOPY (EGD);  Surgeon: Rogene Houston, MD;  [facility];  Service: Endoscopy;  Laterality: N/A;  910  ? EYE SURGERY Left Jan. 16, 2017  ? Cataract  ? IR ANGIO INTRA EXTRACRAN SEL INTERNAL CAROTID BILAT MOD SED  01/20/2017  ? IR ANGIO VERTEBRAL SEL VERTEBRAL UNI L MOD SED  01/20/2017  ? JOINT REPLACEMENT Right   ? Knee  ? JOINT REPLACEMENT Left   ? Hip  ? NOSE SURGERY    ? RADIOLOGY WITH ANESTHESIA N/A 12/29/2014  ? Procedure: Embolization;  Surgeon: Consuella Lose, MD;  Location: [facility];  Service: Radiology;  Laterality: N/A;  ? SUBCLAVIAN ANGIOGRAM  Nov. 8, 2016  ? TOTAL HIP ARTHROPLASTY Left 01/03/2013  ? Procedure: TOTAL HIP ARTHROPLASTY ANTERIOR APPROACH;  Surgeon: Gearlean Alf, MD;  [facility];  Service:  Orthopedics;  Laterality: Left;  ? TOTAL KNEE ARTHROPLASTY Right 10/06/2014  ? Procedure: RIGHT TOTAL KNEE ARTHROPLASTY;  Surgeon: Gearlean Alf, MD;  [facility];  Service: Orthopedics;  Laterality: Right;  ? WISDOM TOOTH EXTRACTION    ? ? ?FAMILY HISTORY ?Family History  ?Problem Relation Age of Onset  ? Arrhythmia Mother   ?     Has pacemaker  ? Hypertension Mother   ? Heart disease Mother   ? Mental illness Mother   ? Varicose Veins Mother   ? Depression Mother   ? Colon cancer Father   ? Cancer Father   ?     Prostate and Colon  ? Diabetes Father   ? Hypertension  Father   ? Cancer - Colon Father   ? Depression Sister   ? ? ?SOCIAL HISTORY ?Social History  ? ?Tobacco Use  ? Smoking status: Former  ?  Packs/day: 0.50  ?  Types: Cigarettes  ?  Start date: 05/23/2015  ?  Quit date: 10/09/2017  ?  Years since quitting: 4.2  ? Smokeless tobacco: Former  ?  Types: Snuff  ? Tobacco comments:  ?  Stated "I smoke off and on"  ?Vaping Use  ? Vaping Use: Never used  ?Substance Use Topics  ? Alcohol use: No  ?  Comment: last had fireball last night 09/10/21  ? Drug use: No  ? ?  ? ?  ? ?OPHTHALMIC EXAM: ? ?Base Eye Exam   ? ? Visual Acuity (ETDRS)   ? ?   Right Left  ? Dist [place] 20/20 20/20  ? ?  ?  ? ? Tonometry (Tonopen, 10:47 AM)   ? ?   Right Left  ? Pressure 14   ? ?  ?  ? ? Pupils   ? ?   Pupils APD  ? Right PERRL None  ? Left PERRL None  ? ?  ?  ? ? Visual Fields (Counting fingers)   ? ?   Left Right  ?  Full Full  ? ?  ?  ? ? Extraocular Movement   ? ?   Right Left  ?  Full Full  ? ?  ?  ? ? Neuro/Psych   ? ? Oriented x3: Yes  ? Mood/Affect: Normal  ? ?  ?  ? ? Dilation   ? ? Both eyes: No dilation @ 10:47 AM  ? ?  ?  ? ?  ? ? ?IMAGING AND PROCEDURES  ?Imaging and Procedures for '@TODAY'$ @ ? ? ? ?  ?  ? ?  ?ASSESSMENT/PLAN: ? ?No diagnosis found. ? ?Ophthalmic Meds Ordered this visit:  ?No orders of the defined types were placed in this encounter. ? ? ?  ? ? ?Pre-op completed. Operative consent obtained with pre-op eye  drops reviewed with Micael Hampshire and sent via [facility] as needed. ?Post op instructions reviewed with patient and per patient all questions answered. ? ?Laurin Coder ? ? ? ?  ?

## 2022-01-22 ENCOUNTER — Other Ambulatory Visit: Payer: Self-pay | Admitting: *Deleted

## 2022-01-22 DIAGNOSIS — M25562 Pain in left knee: Secondary | ICD-10-CM

## 2022-01-28 ENCOUNTER — Telehealth (INDEPENDENT_AMBULATORY_CARE_PROVIDER_SITE_OTHER): Payer: Self-pay

## 2022-01-28 NOTE — Telephone Encounter (Signed)
Patient called stating he missed his eye drops yesterday, but he resumed his drops again today. He is wondering if that is a problem.   Philip Gates called the patient back after speaking with Philip Gates and informed the patient it is okay that he missed the drop and to continue the drops as usual, as directed. He said okay and is continuing.

## 2022-01-29 ENCOUNTER — Encounter (AMBULATORY_SURGERY_CENTER): Payer: PPO | Admitting: Ophthalmology

## 2022-01-29 DIAGNOSIS — H43311 Vitreous membranes and strands, right eye: Secondary | ICD-10-CM

## 2022-01-30 ENCOUNTER — Ambulatory Visit (INDEPENDENT_AMBULATORY_CARE_PROVIDER_SITE_OTHER): Payer: PPO | Admitting: Ophthalmology

## 2022-01-30 ENCOUNTER — Encounter (INDEPENDENT_AMBULATORY_CARE_PROVIDER_SITE_OTHER): Payer: Self-pay | Admitting: Ophthalmology

## 2022-01-30 DIAGNOSIS — H43311 Vitreous membranes and strands, right eye: Secondary | ICD-10-CM

## 2022-01-30 NOTE — Assessment & Plan Note (Signed)
Postop day #1 post vitrectomy

## 2022-01-30 NOTE — Progress Notes (Signed)
01/30/2022     CHIEF COMPLAINT Patient presents for  Chief Complaint  Patient presents with   Post-op Follow-up      HISTORY OF PRESENT ILLNESS: Philip Gates is a 66 y.o. male who presents to the clinic today for:   HPI     Post-op Follow-up           Laterality: right eye   Discomfort: tearing.  Negative for pain, itching, foreign body sensation and discharge   Vision: is improved         Comments   1 day PO OD sx 01/29/2022, Vitrectomy.  For vitreous membranes and strands Patient states vision is improved, states "it felt like it was tearing some but that is about it. No pain." Patient has Ofloxacin and Prednisolone using QID       Last edited by Hurman Horn, MD on 01/30/2022  8:46 AM.      Referring physician: Pablo Lawrence, NP [place],  [place] [address]  HISTORICAL INFORMATION:   Selected notes from the MEDICAL RECORD NUMBER       CURRENT MEDICATIONS: Current Outpatient Medications (Ophthalmic Drugs)  Medication Sig   ofloxacin (OCUFLOX) 0.3 % ophthalmic solution Place 1 drop into the right eye in the morning, at noon, in the evening, and at bedtime for 21 days.   No current facility-administered medications for this visit. (Ophthalmic Drugs)   Current Outpatient Medications (Other)  Medication Sig   Ascorbic Acid (VITAMIN C) 1000 MG tablet Take 1,000 mg by mouth in the morning and at bedtime.   aspirin EC 81 MG tablet Take 81 mg by mouth in the morning. Swallow whole.   atorvastatin (LIPITOR) 10 MG tablet Take 10 mg by mouth. Take Monday,Wednesday and Friday evening   benzonatate (TESSALON) 100 MG capsule Take 1 capsule (100 mg total) by mouth 3 (three) times daily as needed for up to 12 doses for cough.   BREZTRI AEROSPHERE 160-9-4.8 MCG/ACT AERO Inhale 1 puff into the lungs in the morning and at bedtime.   cyclobenzaprine (FLEXERIL) 5 MG tablet Take 1 tablet (5 mg total) by mouth 3 (three) times daily as needed for  muscle spasms.   dexlansoprazole (DEXILANT) 60 MG capsule Take 60 mg by mouth every morning.    Dutasteride-Tamsulosin HCl 0.5-0.4 MG CAPS Take 1 capsule by mouth in the morning.   famotidine (PEPCID) 20 MG tablet Take 20 mg by mouth in the morning and at bedtime.   Homeopathic Products (MUSCLE CRAMP COMPLEX PO) Take 1 capsule by mouth in the morning and at bedtime. Cramp Defense Magnesium for Leg Cramps, Muscle Cramps & Muscle Spasms.   IBU 800 MG tablet Take 800 mg by mouth every 8 (eight) hours as needed (pain).   ipratropium (ATROVENT) 0.03 % nasal spray Place into both nostrils.   lamoTRIgine (LAMICTAL) 200 MG tablet Take 1 tablet (200 mg total) by mouth at bedtime.   lisdexamfetamine (VYVANSE) 70 MG capsule Take 1 capsule (70 mg total) by mouth daily.   lisdexamfetamine (VYVANSE) 70 MG capsule Take 1 capsule (70 mg total) by mouth daily.   lisdexamfetamine (VYVANSE) 70 MG capsule Take 1 capsule (70 mg total) by mouth daily.   metoprolol succinate (TOPROL-XL) 50 MG 24 hr tablet Take 50 mg by mouth at bedtime.   Misc Natural Product Nasal (NASAL CLEANSE RINSE MIX) PACK Place 1 Dose into the nose daily. W/Azithromycin   Misc Natural Products (PROSTATE HEALTH) CAPS Take  1 capsule by mouth in the morning and at bedtime.   mometasone (ELOCON) 0.1 % cream Apply 1 application topically daily as needed (skin irritation).   Multiple Vitamins-Minerals (MULTIVITAMIN WITH MINERALS) tablet Take 1 tablet by mouth daily. Multivitamin for Women (needs the iron)   PROAIR HFA 108 (90 Base) MCG/ACT inhaler Inhale 1-2 puffs into the lungs every 6 (six) hours as needed for shortness of breath or wheezing.   Probiotic Product (DIGESTIVE ADV MULTI-STRAIN PO) Take 1 capsule by mouth in the morning and at bedtime.   promethazine (PHENERGAN) 25 MG tablet Take 25 mg by mouth every 6 (six) hours as needed for nausea or vomiting.   tadalafil (CIALIS) 20 MG tablet Take 20 mg by mouth at bedtime as needed for erectile  dysfunction.   telmisartan-hydrochlorothiazide (MICARDIS HCT) 80-12.5 MG tablet Take 1 tablet by mouth daily.   testosterone cypionate (DEPOTESTOTERONE CYPIONATE) 200 MG/ML injection Inject 200 mg into the muscle every 14 (fourteen) days.    No current facility-administered medications for this visit. (Other)      REVIEW OF SYSTEMS: ROS   Negative for: Constitutional, Gastrointestinal, Neurological, Skin, Genitourinary, Musculoskeletal, HENT, Endocrine, Cardiovascular, Eyes, Respiratory, Psychiatric, Allergic/Imm, Heme/Lymph Last edited by Hurman Horn, MD on 01/30/2022  8:48 AM.       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   Venous insufficiency    Past Surgical History:  Procedure Laterality Date   cerebral stent     COLONOSCOPY N/A 01/02/2017   Procedure: COLONOSCOPY;  Surgeon: Rogene Houston, MD;  [facility];  Service: Endoscopy;  Laterality: N/A;    dental implant  09/26/2014   upper and lower dental implant/bridges- permanent   ESOPHAGOGASTRODUODENOSCOPY N/A 01/02/2017   Procedure: ESOPHAGOGASTRODUODENOSCOPY (EGD);  Surgeon: Rogene Houston, MD;  [facility];  Service: Endoscopy;  Laterality: N/A;  910   EYE SURGERY Left Jan. 16, 2017   Cataract   IR ANGIO INTRA EXTRACRAN SEL INTERNAL CAROTID BILAT MOD SED  01/20/2017   IR ANGIO VERTEBRAL SEL VERTEBRAL UNI L MOD SED  01/20/2017   JOINT REPLACEMENT Right    Knee   JOINT REPLACEMENT Left    Hip   NOSE SURGERY     RADIOLOGY WITH ANESTHESIA N/A 12/29/2014   Procedure: Embolization;  Surgeon: Consuella Lose,  MD;  Location: [facility];  Service: Radiology;  Laterality: N/A;   SUBCLAVIAN ANGIOGRAM  Nov. 8, 2016   TOTAL HIP ARTHROPLASTY Left 01/03/2013   Procedure: TOTAL HIP ARTHROPLASTY ANTERIOR APPROACH;  Surgeon: Gearlean Alf, MD;  [facility];  Service: Orthopedics;  Laterality: Left;   TOTAL KNEE ARTHROPLASTY Right 10/06/2014   Procedure: RIGHT TOTAL KNEE ARTHROPLASTY;  Surgeon: Gearlean Alf, MD;  [facility];  Service: Orthopedics;  Laterality: Right;   WISDOM TOOTH EXTRACTION      FAMILY HISTORY Family History  Problem Relation Age of Onset   Arrhythmia Mother        Has pacemaker   Hypertension Mother    Heart disease Mother    Mental illness Mother    Varicose Veins Mother    Depression Mother    Colon cancer Father    Cancer Father        Prostate and Colon   Diabetes Father    Hypertension Father    Cancer - Colon Father    Depression Sister     SOCIAL HISTORY Social History   Tobacco Use   Smoking status: Former    Packs/day: 0.50    Types: Cigarettes    Start date: 05/23/2015    Quit date: 10/09/2017    Years since quitting: 4.3   Smokeless tobacco: Former    Types: Snuff   Tobacco comments:    Stated "I smoke off and on"  Vaping Use   Vaping Use: Never used  Substance Use Topics   Alcohol use: No    Comment: last had fireball  last night 09/10/21   Drug use: No         OPHTHALMIC EXAM:  Base Eye Exam     Visual Acuity (ETDRS)       Right Left   Dist [place] 20/20 -2 20/20         Tonometry (Tonopen, 8:17 AM)       Right Left   Pressure 10 10         Pupils       Dark Light Shape APD   Right Dilated   None   Left 2 1 Round None         Extraocular Movement       Right Left    Full Full         Neuro/Psych     Oriented x3: Yes   Mood/Affect: Normal         Dilation     Right eye: 1.0% Mydriacyl, 2.5% Phenylephrine @ 8:18 AM           Slit Lamp and Fundus Exam     External Exam       Right Left   External Normal Normal         Slit Lamp Exam       Right Left   Lids/Lashes Normal Normal   Conjunctiva/Sclera 1+ Injection, 1+ Subconjunctival hemorrhage, wound secure White and quiet   Cornea Clear Clear   Anterior Chamber Deep and quiet Deep and quiet   Iris Round and reactive Round and reactive   Lens Centered posterior chamber intraocular lens Centered posterior chamber intraocular lens   Anterior Vitreous Normal Normal         Fundus Exam       Right Left   Posterior Vitreous clear, avitric    Disc Normal    C/D Ratio 0.3    Macula Normal  Vessels Normal    Periphery Normal, no holes or tears             IMAGING AND PROCEDURES  Imaging and Procedures for 01/30/22           ASSESSMENT/PLAN:  Vitreous membranes and strands, right eye Postop day #1 post vitrectomy     ICD-10-CM   1. Vitreous membranes and strands, right eye  H43.311       OD looks great, much improved symptomatology per patient.  Visual acuity stable.  Findings stable and intact.  2.  Patient to resume medications topically.  Explained in detail.  3.  Limitations on activity and intentional exercise also reviewed.  No intentional activity for the next 11 days where heart rate is is elevated  Ophthalmic Meds Ordered this visit:  No orders of the defined types  were placed in this encounter.      Return in about 11 days (around 02/10/2022) for POST OP, OD, OCT, COLOR FP.  There are no Patient Instructions on file for this visit.   Explained the diagnoses, plan, and follow up with the patient and they expressed understanding.  Patient expressed understanding of the importance of proper follow up care.   Clent Demark Maliah Pyles M.D. Diseases & Surgery of the Retina and Vitreous Retina & Diabetic [place] 01/30/22     Abbreviations: M myopia (nearsighted); A astigmatism; H hyperopia (farsighted); P presbyopia; Mrx spectacle prescription;  CTL contact lenses; OD right eye; OS left eye; OU both eyes  XT exotropia; ET esotropia; PEK punctate epithelial keratitis; PEE punctate epithelial erosions; DES dry eye syndrome; MGD meibomian gland dysfunction; ATs artificial tears; PFAT's preservative free artificial tears; [place] nuclear sclerotic cataract; PSC posterior subcapsular cataract; ERM epi-retinal membrane; PVD posterior vitreous detachment; RD retinal detachment; DM diabetes mellitus; DR diabetic retinopathy; NPDR non-proliferative diabetic retinopathy; PDR proliferative diabetic retinopathy; CSME clinically significant macular edema; DME diabetic macular edema; dbh dot blot hemorrhages; CWS cotton wool spot; POAG primary open angle glaucoma; C/D cup-to-disc ratio; HVF humphrey visual field; GVF goldmann visual field; OCT optical coherence tomography; IOP intraocular pressure; BRVO Branch retinal vein occlusion; CRVO central retinal vein occlusion; CRAO central retinal artery occlusion; BRAO branch retinal artery occlusion; RT retinal tear; SB scleral buckle; PPV pars plana vitrectomy; VH Vitreous hemorrhage; PRP panretinal laser photocoagulation; IVK intravitreal kenalog; VMT vitreomacular traction; MH Macular hole;  NVD neovascularization of the disc; NVE neovascularization elsewhere; AREDS age related eye disease study; ARMD age related macular degeneration; POAG  primary open angle glaucoma; EBMD epithelial/anterior basement membrane dystrophy; ACIOL anterior chamber intraocular lens; IOL intraocular lens; PCIOL posterior chamber intraocular lens; Phaco/IOL phacoemulsification with intraocular lens placement; [place] photorefractive keratectomy; LASIK laser assisted in situ keratomileusis; HTN hypertension; DM diabetes mellitus; COPD chronic obstructive pulmonary disease

## 2022-02-04 ENCOUNTER — Ambulatory Visit (HOSPITAL_COMMUNITY)
Admission: RE | Admit: 2022-02-04 | Discharge: 2022-02-04 | Disposition: A | Discharge status: Home / Self Care | Payer: PPO | Source: Ambulatory Visit | Attending: Vascular Surgery | Admitting: Vascular Surgery

## 2022-02-04 ENCOUNTER — Telehealth (INDEPENDENT_AMBULATORY_CARE_PROVIDER_SITE_OTHER): Payer: PPO | Admitting: Psychiatry

## 2022-02-04 ENCOUNTER — Encounter (HOSPITAL_COMMUNITY): Payer: Self-pay | Admitting: Psychiatry

## 2022-02-04 ENCOUNTER — Ambulatory Visit: Payer: PPO | Admitting: Physician Assistant

## 2022-02-04 VITALS — BP 102/68 | HR 95 | Temp 97.6°F | Resp 18 | Ht 71.0 in | Wt 200.0 lb

## 2022-02-04 DIAGNOSIS — M25561 Pain in right knee: Secondary | ICD-10-CM | POA: Insufficient documentation

## 2022-02-04 DIAGNOSIS — M25562 Pain in left knee: Secondary | ICD-10-CM | POA: Diagnosis present

## 2022-02-04 DIAGNOSIS — F900 Attention-deficit hyperactivity disorder, predominantly inattentive type: Secondary | ICD-10-CM

## 2022-02-04 DIAGNOSIS — I872 Venous insufficiency (chronic) (peripheral): Secondary | ICD-10-CM | POA: Diagnosis not present

## 2022-02-04 DIAGNOSIS — F3162 Bipolar disorder, current episode mixed, moderate: Secondary | ICD-10-CM

## 2022-02-04 MED ORDER — LISDEXAMFETAMINE DIMESYLATE 70 MG PO CAPS: 70.0000 mg | ORAL_CAPSULE | Freq: Every day | ORAL | 0 refills | Status: DC

## 2022-02-04 MED ORDER — LAMOTRIGINE 200 MG PO TABS: 200.0000 mg | ORAL_TABLET | Freq: Every day | ORAL | 2 refills | Status: DC

## 2022-02-04 NOTE — Progress Notes (Signed)
Virtual Visit via Telephone Note  I connected with Philip Gates on 02/04/22 at  9:20 AM EDT by telephone and verified that I am speaking with the correct person using two identifiers.  Location: Patient: home Provider: office   I discussed the limitations, risks, security and privacy concerns of performing an evaluation and management service by telephone and the availability of in person appointments. I also discussed with the patient that there may be a patient responsible charge related to this service. The patient expressed understanding and agreed to proceed.    I discussed the assessment and treatment plan with the patient. The patient was provided an opportunity to ask questions and all were answered. The patient agreed with the plan and demonstrated an understanding of the instructions.   The patient was advised to call back or seek an in-person evaluation if the symptoms worsen or if the condition fails to improve as anticipated.  I provided 15 minutes of non-face-to-face time during this encounter.   [name], MD  [facility] MD/PA/NP OP Progress Note  02/04/2022 9:35 AM Philip Gates  MRN:  [id number]  Chief Complaint:  Chief Complaint  Patient presents with   ADD   Manic Behavior   Follow-up   HPI: This patient is a 66 year old married white male who lives with his wife in [place].  He used to work in [employer] [place] but has been retired for several years.  The patient returns for follow-up after 3 months regarding his mood swings and ADHD.  He states that he is doing very well.  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  He is staying very active and busy and has lost 30 pounds.  His mood has been "excellent."  He denies symptoms of depression or manic symptoms such as agitation or anger.  His focus is good. Visit Diagnosis:    ICD-10-CM   1. Attention deficit hyperactivity disorder (ADHD), predominantly inattentive  type  F90.0     2. 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   Venous insufficiency     Past Surgical History:  Procedure Laterality Date   cerebral stent     COLONOSCOPY N/A 01/02/2017   Procedure: COLONOSCOPY;  Surgeon: Rogene Houston, MD;  [facility];  Service: Endoscopy;  Laterality: N/A;   dental implant  09/26/2014   upper and lower dental implant/bridges- permanent   ESOPHAGOGASTRODUODENOSCOPY N/A 01/02/2017   Procedure: ESOPHAGOGASTRODUODENOSCOPY (EGD);  Surgeon: Rogene Houston, MD;  [facility];  Service: Endoscopy;  Laterality: N/A;  910   EYE SURGERY Left Jan. 16, 2017   Cataract   IR ANGIO INTRA EXTRACRAN SEL INTERNAL CAROTID BILAT MOD SED  01/20/2017   IR ANGIO VERTEBRAL SEL VERTEBRAL UNI L MOD SED  01/20/2017   JOINT REPLACEMENT Right    Knee   JOINT REPLACEMENT Left    Hip   NOSE SURGERY     RADIOLOGY WITH ANESTHESIA N/A 12/29/2014   Procedure: Embolization;  Surgeon: Consuella Lose, MD;  Location: [place];  Service: Radiology;  Laterality: N/A;   SUBCLAVIAN ANGIOGRAM  Nov. 8, 2016   TOTAL HIP ARTHROPLASTY Left 01/03/2013   Procedure: TOTAL HIP ARTHROPLASTY ANTERIOR  APPROACH;  Surgeon: Gearlean Alf, MD;  Location: [facility];  Service: Orthopedics;  Laterality: Left;   TOTAL KNEE ARTHROPLASTY Right 10/06/2014   Procedure: RIGHT TOTAL KNEE ARTHROPLASTY;  Surgeon: Gearlean Alf, MD;  Location: [facility];  Service: Orthopedics;  Laterality: Right;   WISDOM TOOTH EXTRACTION      Family Psychiatric History: see below  Family History:  Family History  Problem Relation Age of Onset   Arrhythmia Mother        Has pacemaker   Hypertension Mother    Heart disease Mother    Mental illness Mother    Varicose Veins Mother    Depression Mother    Colon cancer Father    Cancer Father        Prostate and Colon   Diabetes Father    Hypertension Father    Cancer - Colon Father    Depression Sister     Social History:  Social History   Socioeconomic History   Marital status: Married    Spouse name: Not on file   Number of children: Not on file   Years of education: Not on file   Highest education level: Not on file  Occupational History   Not on file  Tobacco Use   Smoking status: Former    Packs/day: 0.50    Types: Cigarettes    Start date: 05/23/2015    Quit date: 10/09/2017    Years since quitting: 4.3   Smokeless tobacco: Former    Types: Snuff   Tobacco comments:    Stated "I smoke off and on"  Vaping Use   Vaping Use: Never used  Substance and Sexual Activity   Alcohol use: No    Comment: last had fireball last night 09/10/21   Drug use: No   Sexual activity: Yes    Partners: Female  Other Topics Concern   Not on file  Social History Narrative   Not on file   Social Determinants of Health   Financial Resource Strain: Not on file  Food Insecurity: Not on file  Transportation Needs: Not on file  Physical Activity: Not on file  Stress: Not on file  Social Connections: Not on file    Allergies:  Allergies  Allergen Reactions   Naltrexone Anaphylaxis   Clindamycin Other (See Comments)    Abdominal pain "twisted  stomach" Abdominal pain "twisted stomach" Abdominal pain "twisted stomach" Abdominal pain "twisted stomach" Abdominal pain "twisted stomach"   Iodine Other (See Comments)   Erythromycin     Caused a "twisted stomach"   Penicillins Rash    Metabolic Disorder Labs: No results found for: HGBA1C, MPG No results found for: PROLACTIN No results found for: CHOL, TRIG, HDL, CHOLHDL, VLDL, LDLCALC No results found for: TSH  Therapeutic Level  Labs: No results found for: LITHIUM No results found for: VALPROATE No components found for:  CBMZ  Current Medications: Current Outpatient Medications  Medication Sig Dispense Refill   Ascorbic Acid (VITAMIN C) 1000 MG tablet Take 1,000 mg by mouth in the morning and at bedtime.     aspirin EC 81 MG tablet Take 81 mg by mouth in the morning. Swallow whole.     benzonatate (TESSALON) 100 MG capsule Take 1 capsule (100 mg total) by mouth 3 (three) times daily as needed for up to 12 doses for cough. 12 capsule 0   BREZTRI AEROSPHERE 160-9-4.8 MCG/ACT AERO Inhale 1 puff into the lungs in the morning and at bedtime.     cyclobenzaprine (FLEXERIL) 5 MG tablet Take 1 tablet (5 mg total) by mouth 3 (three) times daily as needed for muscle spasms. 30 tablet 0   dexlansoprazole (DEXILANT) 60 MG capsule Take 60 mg by mouth every morning.      Dutasteride-Tamsulosin HCl 0.5-0.4 MG CAPS Take 1 capsule by mouth in the morning.     famotidine (PEPCID) 20 MG tablet Take 20 mg by mouth in the morning and at bedtime.     Homeopathic Products (MUSCLE CRAMP COMPLEX PO) Take 1 capsule by mouth in the morning and at bedtime. Cramp Defense Magnesium for Leg Cramps, Muscle Cramps & Muscle Spasms.     IBU 800 MG tablet Take 800 mg by mouth every 8 (eight) hours as needed (pain).     ipratropium (ATROVENT) 0.03 % nasal spray Place into both nostrils.     lamoTRIgine (LAMICTAL) 200 MG tablet Take 1 tablet (200 mg total) by mouth at bedtime. 30 tablet 2   lisdexamfetamine  (VYVANSE) 70 MG capsule Take 1 capsule (70 mg total) by mouth daily. 30 capsule 0   lisdexamfetamine (VYVANSE) 70 MG capsule Take 1 capsule (70 mg total) by mouth daily. 30 capsule 0   lisdexamfetamine (VYVANSE) 70 MG capsule Take 1 capsule (70 mg total) by mouth daily. 30 capsule 0   metoprolol succinate (TOPROL-XL) 50 MG 24 hr tablet Take 50 mg by mouth at bedtime.     Misc Natural Product Nasal (NASAL CLEANSE RINSE MIX) PACK Place 1 Dose into the nose daily. W/Azithromycin     Misc Natural Products (PROSTATE HEALTH) CAPS Take 1 capsule by mouth in the morning and at bedtime.     mometasone (ELOCON) 0.1 % cream Apply 1 application topically daily as needed (skin irritation).     Multiple Vitamins-Minerals (MULTIVITAMIN WITH MINERALS) tablet Take 1 tablet by mouth daily. Multivitamin for Women (needs the iron)     ofloxacin (OCUFLOX) 0.3 % ophthalmic solution Place 1 drop into the right eye in the morning, at noon, in the evening, and at bedtime for 21 days. 5 mL 0   PROAIR HFA 108 (90 Base) MCG/ACT inhaler Inhale 1-2 puffs into the lungs every 6 (six) hours as needed for shortness of breath or wheezing.     Probiotic Product (DIGESTIVE ADV MULTI-STRAIN PO) Take 1 capsule by mouth in the morning and at bedtime.     promethazine (PHENERGAN) 25 MG tablet Take 25 mg by mouth every 6 (six) hours as needed for nausea or vomiting.     tadalafil (CIALIS) 20 MG tablet Take 20 mg by mouth at bedtime as needed for erectile dysfunction.     telmisartan-hydrochlorothiazide (MICARDIS HCT) 80-12.5 MG tablet Take 1 tablet by mouth daily.     testosterone cypionate (DEPOTESTOTERONE CYPIONATE) 200 MG/ML injection Inject  200 mg into the muscle every 14 (fourteen) days.      No current facility-administered medications for this visit.     Musculoskeletal: Strength & Muscle Tone: na Gait & Station: na Patient leans: N/A  Psychiatric Specialty Exam: Review of Systems  All other systems reviewed and are  negative.  There were no vitals taken for this visit.There is no height or weight on file to calculate BMI.  General Appearance: NA  Eye Contact:  NA  Speech:  Clear and Coherent  Volume:  Normal  Mood:  Euthymic  Affect:  NA  Thought Process:  Goal Directed  Orientation:  Full (Time, Place, and Person)  Thought Content: WDL   Suicidal Thoughts:  No  Homicidal Thoughts:  No  Memory:  Immediate;   Good Recent;   Good Remote;   Fair  Judgement:  Good  Insight:  Good  Psychomotor Activity:  Normal  Concentration:  Concentration: Good and Attention Span: Good  Recall:  Good  Fund of Knowledge: Good  Language: Good  Akathisia:  No  Handed:  Right  AIMS (if indicated): not done  Assets:  Communication Skills Desire for Improvement Resilience Social Support Talents/Skills  ADL's:  Intact  Cognition: WNL  Sleep:  Good   Screenings: PHQ2-9    Flowsheet Row Video Visit from 02/04/2022 in [facility] Video Visit from 08/14/2021 in [facility] Video Visit from 05/20/2021 in [facility]  PHQ-2 Total Score 0 0 0      Flowsheet Row Video Visit from 02/04/2022 in [facility] from 10/22/2021 in [facility] to Hosp-Admission (Discharged) from 09/10/2021 in [place] [facility] [place] No Risk No Risk No Risk        Assessment and Plan: This patient is a 66 year old male with a history of bipolar disorder and ADHD.  He continues to do well and has had no further incidents of narcotic withdrawal.  His mood is stable and he is focusing well.  He will continue Lamictal 200 mg daily for mood stabilization and Vyvanse 70 mg every morning for ADHD.  He will return to see me in 3 months  Collaboration of Care: Collaboration of Care: Primary Care Provider AEB notes will be  shared with PCP at patient's request  Patient/Guardian was advised Release of Information must be obtained prior to any record release in order to collaborate their care with an outside provider. Patient/Guardian was advised if they have not already done so to contact the registration department to sign all necessary forms in order for [place] to release information regarding their care.   Consent: Patient/Guardian gives verbal consent for treatment and assignment of benefits for services provided during this visit. Patient/Guardian expressed understanding and agreed to proceed.    [name], MD 02/04/2022, 9:35 AM

## 2022-02-04 NOTE — Progress Notes (Signed)
[facility] [place]   Reason for referral: Swollen left posterior thigh veins with pain leg  History of Present Illness  Philip Gates is a 66 y.o. male who presents with chief complaint: swollen leg.  He has a history of similar pain and discomfort on the right LE with chronic venous reflux and had laser ablation therapy on the right LE .  He has noted the return of new varicose veins on the right LE with multiple spider veins as well around B ankles.   He states he develops swelling and pain posterior left thigh when seated for more than 20 min.  His veins have become very painful and distended. He denies weeping or skin changes and no history of venous stasis ulcers.    He is medically managed in the past with compression.  He has not worn compression for the past few years.    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   Venous insufficiency     Past Surgical History:  Procedure Laterality Date   cerebral stent     COLONOSCOPY N/A 01/02/2017   Procedure: COLONOSCOPY;  Surgeon: Rogene Houston, MD;  Location: [facility];  Service: Endoscopy;  Laterality: N/A;   dental implant  09/26/2014   upper and lower dental implant/bridges- permanent   ESOPHAGOGASTRODUODENOSCOPY N/A 01/02/2017   Procedure: ESOPHAGOGASTRODUODENOSCOPY (EGD);  Surgeon: Rogene Houston, MD;  Location: [facility];  Service: Endoscopy;  Laterality: N/A;  910   EYE SURGERY Left Jan. 16, 2017   Cataract   IR ANGIO INTRA EXTRACRAN SEL INTERNAL CAROTID BILAT MOD SED  01/20/2017   IR ANGIO VERTEBRAL SEL VERTEBRAL UNI L MOD SED  01/20/2017   JOINT REPLACEMENT Right    Knee   JOINT REPLACEMENT Left    Hip   NOSE SURGERY     RADIOLOGY WITH ANESTHESIA N/A 12/29/2014   Procedure: Embolization;  Surgeon: Consuella Lose, MD;  Location: [place];  Service: Radiology;  Laterality: N/A;   SUBCLAVIAN ANGIOGRAM  Nov. 8, 2016   TOTAL HIP ARTHROPLASTY Left 01/03/2013   Procedure: TOTAL HIP ARTHROPLASTY ANTERIOR APPROACH;  Surgeon: Gearlean Alf, MD;  Location: [facility];  Service: Orthopedics;  Laterality: Left;   TOTAL KNEE ARTHROPLASTY Right 10/06/2014   Procedure: RIGHT TOTAL KNEE ARTHROPLASTY;  Surgeon: Gearlean Alf, MD;  Location: [facility];  Service: Orthopedics;  Laterality: Right;   WISDOM TOOTH EXTRACTION      Social History   Socioeconomic History   Marital status: Married    Spouse name: Not on file   Number of children: Not on file   Years of education: Not on file   Highest education  level: Not on file  Occupational History   Not on file  Tobacco Use   Smoking status: Former    Packs/day: 0.50    Types: Cigarettes    Start date: 05/23/2015    Quit date: 10/09/2017    Years since quitting: 4.3   Smokeless tobacco: Former    Types: Snuff   Tobacco comments:    Stated "I smoke off and on"  Vaping Use   Vaping Use: Never used  Substance and Sexual Activity   Alcohol use: No    Comment: last had fireball last night 09/10/21   Drug use: No   Sexual activity: Yes    Partners: Female  Other Topics Concern   Not on file  Social History Narrative   Not on file   Social Determinants of Health   Financial Resource Strain: Not on file  Food Insecurity: Not on file  Transportation Needs: Not on file  Physical Activity: Not on file  Stress: Not on file   Social Connections: Not on file  Intimate Partner Violence: Not on file    Family History  Problem Relation Age of Onset   Arrhythmia Mother        Has pacemaker   Hypertension Mother    Heart disease Mother    Mental illness Mother    Varicose Veins Mother    Depression Mother    Colon cancer Father    Cancer Father        Prostate and Colon   Diabetes Father    Hypertension Father    Cancer - Colon Father    Depression Sister     Current Outpatient Medications on File Prior to Visit  Medication Sig Dispense Refill   Ascorbic Acid (VITAMIN C) 1000 MG tablet Take 1,000 mg by mouth in the morning and at bedtime.     aspirin EC 81 MG tablet Take 81 mg by mouth in the morning. Swallow whole.     benzonatate (TESSALON) 100 MG capsule Take 1 capsule (100 mg total) by mouth 3 (three) times daily as needed for up to 12 doses for cough. 12 capsule 0   BREZTRI AEROSPHERE 160-9-4.8 MCG/ACT AERO Inhale 1 puff into the lungs in the morning and at bedtime.     cyclobenzaprine (FLEXERIL) 5 MG tablet Take 1 tablet (5 mg total) by mouth 3 (three) times daily as needed for muscle spasms. 30 tablet 0   dexlansoprazole (DEXILANT) 60 MG capsule Take 60 mg by mouth every morning.      Dutasteride-Tamsulosin HCl 0.5-0.4 MG CAPS Take 1 capsule by mouth in the morning.     famotidine (PEPCID) 20 MG tablet Take 20 mg by mouth in the morning and at bedtime.     Homeopathic Products (MUSCLE CRAMP COMPLEX PO) Take 1 capsule by mouth in the morning and at bedtime. Cramp Defense Magnesium for Leg Cramps, Muscle Cramps & Muscle Spasms.     IBU 800 MG tablet Take 800 mg by mouth every 8 (eight) hours as needed (pain).     ipratropium (ATROVENT) 0.03 % nasal spray Place into both nostrils.     lamoTRIgine (LAMICTAL) 200 MG tablet Take 1 tablet (200 mg total) by mouth at bedtime. 30 tablet 2   lisdexamfetamine (VYVANSE) 70 MG capsule Take 1 capsule (70 mg total) by mouth daily. 30 capsule 0   lisdexamfetamine  (VYVANSE) 70 MG capsule Take 1 capsule (70 mg total) by mouth daily. 30 capsule 0   lisdexamfetamine (VYVANSE) 70 MG  capsule Take 1 capsule (70 mg total) by mouth daily. 30 capsule 0   metoprolol succinate (TOPROL-XL) 50 MG 24 hr tablet Take 50 mg by mouth at bedtime.     Misc Natural Product Nasal (NASAL CLEANSE RINSE MIX) PACK Place 1 Dose into the nose daily. W/Azithromycin     Misc Natural Products (PROSTATE HEALTH) CAPS Take 1 capsule by mouth in the morning and at bedtime.     mometasone (ELOCON) 0.1 % cream Apply 1 application topically daily as needed (skin irritation).     Multiple Vitamins-Minerals (MULTIVITAMIN WITH MINERALS) tablet Take 1 tablet by mouth daily. Multivitamin for Women (needs the iron)     ofloxacin (OCUFLOX) 0.3 % ophthalmic solution Place 1 drop into the right eye in the morning, at noon, in the evening, and at bedtime for 21 days. 5 mL 0   PROAIR HFA 108 (90 Base) MCG/ACT inhaler Inhale 1-2 puffs into the lungs every 6 (six) hours as needed for shortness of breath or wheezing.     promethazine (PHENERGAN) 25 MG tablet Take 25 mg by mouth every 6 (six) hours as needed for nausea or vomiting.     tadalafil (CIALIS) 20 MG tablet Take 20 mg by mouth at bedtime as needed for erectile dysfunction.     telmisartan-hydrochlorothiazide (MICARDIS HCT) 80-12.5 MG tablet Take 1 tablet by mouth daily.     testosterone cypionate (DEPOTESTOTERONE CYPIONATE) 200 MG/ML injection Inject 200 mg into the muscle every 14 (fourteen) days.      Probiotic Product (DIGESTIVE ADV MULTI-STRAIN PO) Take 1 capsule by mouth in the morning and at bedtime.     No current facility-administered medications on file prior to visit.    Allergies as of 02/04/2022 - Review Complete 02/04/2022  Allergen Reaction Noted   Naltrexone Anaphylaxis 10/10/2021   Clindamycin Other (See Comments) 11/27/2020   Iodine Other (See Comments) 12/10/2020   Erythromycin  01/02/2017   Penicillins Rash 12/21/2012      ROS:   General:  No weight loss, Fever, chills  HEENT: No recent headaches, no nasal bleeding, no visual changes, no sore throat  Neurologic: No dizziness, blackouts, seizures. No recent symptoms of stroke or mini- stroke. No recent episodes of slurred speech, or temporary blindness.  Cardiac: No recent episodes of chest pain/pressure, no shortness of breath at rest.  No shortness of breath with exertion.  Denies history of atrial fibrillation or irregular heartbeat  Vascular: No history of rest pain in feet.  No history of claudication.  No history of non-healing ulcer, No history of DVT   Pulmonary: No home oxygen, no productive cough, no hemoptysis,  No asthma or wheezing  Musculoskeletal:  '[ ]'$  Arthritis, '[ ]'$  Low back pain,  [x ] Joint pain  Hematologic:No history of hypercoagulable state.  No history of easy bleeding.  No history of anemia  Gastrointestinal: No hematochezia or melena,  No gastroesophageal reflux, no trouble swallowing  Urinary: '[ ]'$  chronic Kidney disease, '[ ]'$  on HD - '[ ]'$  MWF or '[ ]'$  TTHS, '[ ]'$  Burning with urination, '[ ]'$  Frequent urination, '[ ]'$  Difficulty urinating;   Skin: No rashes  Psychological: No history of anxiety,  positive history of depression  Physical Examination  Vitals:   02/04/22 0952  BP: 102/68  Pulse: 95  Resp: 18  Temp: 97.6 F (36.4 C)  TempSrc: Oral  SpO2: 97%  Weight: 200 lb (90.7 kg)  Height: '5\' 11"'$  (1.803 m)    Body mass index is 27.89  kg/m.  General:  Alert and oriented, no acute distress HEENT: Normal Neck: No bruit or JVD Pulmonary: Clear to auscultation bilaterally Cardiac: Regular Rate and Rhythm without murmur Abdomen: Soft, non-tender, non-distended, no mass, no scars Skin: No rash         Extremity Pulses:  2+ radial,  femoral, dorsalis pedis, pulses bilaterally Musculoskeletal: No deformity or mild edema  Neurologic: Upper and lower extremity motor 5/5 and symmetric  DATA:    Venous Reflux  Times  +------------------+---------+------+-----------+------------+-------------  ----+  RIGHT             Reflux NoRefluxReflux TimeDiameter cmsComments                                        Yes                                             +------------------+---------+------+-----------+------------+-------------  ----+  CFV                         yes   >1 second                                 +------------------+---------+------+-----------+------------+-------------  ----+  FV mid                      yes   >1 second                                 +------------------+---------+------+-----------+------------+-------------  ----+  Popliteal         no                                                        +------------------+---------+------+-----------+------------+-------------  ----+  GSV at SFJ                  yes    >500 ms      0.28    not  visualized                                                             past SFJ            +------------------+---------+------+-----------+------------+-------------  ----+  GSV prox thigh                                          prior  ablation/strippin                                                          g                    +------------------+---------+------+-----------+------------+-------------  ----+  GSV mid thigh                                           prior                                                                          ablation/strippin                                                          g                    +------------------+---------+------+-----------+------------+-------------  ----+  GSV dist thigh                                          prior                                                                           ablation/strippin                                                          g                    +------------------+---------+------+-----------+------------+-------------  ----+  GSV at knee                                             prior  ablation/strippin                                                          g                    +------------------+---------+------+-----------+------------+-------------  ----+  GSV prox calf     no                            0.19                        +------------------+---------+------+-----------+------------+-------------  ----+  SSV Pop Fossa     no                            0.17                        +------------------+---------+------+-----------+------------+-------------  ----+  SSV prox calf     no                            0.18                        +------------------+---------+------+-----------+------------+-------------  ----+  SSV mid calf      no                            0.14                        +------------------+---------+------+-----------+------------+-------------  ----+  Mid calf                    yes    >500 ms                                  perforator                                                                  +------------------+---------+------+-----------+------------+-------------  ----+  Distal calf                 yes    >500 ms                                  perforator                                                                  +------------------+---------+------+-----------+------------+-------------  ----+      +-------------------+---------+------+-----------+------------+--------+  LEFT  Reflux NoRefluxReflux TimeDiameter cmsComments                               Yes                                    +-------------------+---------+------+-----------+------------+--------+  CFV                          yes   >1 second                       +-------------------+---------+------+-----------+------------+--------+  FV mid             no                                              +-------------------+---------+------+-----------+------------+--------+  Popliteal          no                                              +-------------------+---------+------+-----------+------------+--------+  GSV at SFJ                   yes    >500 ms      0.38              +-------------------+---------+------+-----------+------------+--------+  GSV prox thigh               yes    >500 ms      0.37              +-------------------+---------+------+-----------+------------+--------+  GSV mid thigh                yes    >500 ms      0.28              +-------------------+---------+------+-----------+------------+--------+  GSV dist thigh     no                            0.31              +-------------------+---------+------+-----------+------------+--------+  GSV at knee                  yes    >500 ms      0.26              +-------------------+---------+------+-----------+------------+--------+  GSV prox calf                yes    >500 ms      0.27              +-------------------+---------+------+-----------+------------+--------+  SSV Pop Fossa      no                            0.11              +-------------------+---------+------+-----------+------------+--------+  SSV prox calf      no  0.11              +-------------------+---------+------+-----------+------------+--------+  SSV mid calf       no                            0.16              +-------------------+---------+------+-----------+------------+--------+  Mid calf perforator          yes    >500 ms       0.30              +-------------------+---------+------+-----------+------------+--------+   Summary:  Right:  - No evidence of deep vein thrombosis seen in the right lower extremity,  from the common femoral through the popliteal veins.  - No evidence of superficial venous thrombosis in the right lower  extremity.     - Venous reflux is noted in the right common femoral vein.  - Venous reflux is noted in the right sapheno-femoral junction.  - Venous reflux is noted in the right femoral vein.  - Venous reflux is noted in the right perforator vein.      Assessment/ Plan: Venous reflux with painful left LE     Left:  - No evidence of deep vein thrombosis seen in the left lower extremity,  from the common femoral through the popliteal veins.  - No evidence of superficial venous thrombosis in the left lower  extremity.  - Venous reflux is noted in the left common femoral vein.  - Venous reflux is noted in the left sapheno-femoral junction.  - Venous reflux is noted in the left greater saphenous vein in the thigh.  - Venous reflux is noted in the left greater saphenous vein in the calf.  - Venous reflux is noted in the left perforator vein.   He has chronic venous insufficiency and has had the right GSV laser ablation therapy.  He is now have symptoms of left LE pain with mild edema.  He was fitted with thigh high compression 20-30 mm hg today, asked to perform elevation and exercise daily. He will f/u in 3 months to be considered for intervention in the vein clinic.  The vein size is on the cusp of 0.4 cm in size and he does have reflux at the [facility].  The deep system reflux is also contributing to his complaints likely.    Roxy Horseman PA-C [facility] [place] Office: [phone]  MD in clinic [place]

## 2022-02-10 ENCOUNTER — Encounter (INDEPENDENT_AMBULATORY_CARE_PROVIDER_SITE_OTHER): Payer: PPO | Admitting: Ophthalmology

## 2022-02-11 ENCOUNTER — Encounter (INDEPENDENT_AMBULATORY_CARE_PROVIDER_SITE_OTHER): Payer: Self-pay | Admitting: Ophthalmology

## 2022-02-11 ENCOUNTER — Ambulatory Visit (INDEPENDENT_AMBULATORY_CARE_PROVIDER_SITE_OTHER): Payer: PPO | Admitting: Ophthalmology

## 2022-02-11 DIAGNOSIS — H43311 Vitreous membranes and strands, right eye: Secondary | ICD-10-CM

## 2022-02-11 NOTE — Progress Notes (Signed)
02/11/2022     CHIEF COMPLAINT Patient presents for  Chief Complaint  Patient presents with   Post-op Follow-up      HISTORY OF PRESENT ILLNESS: Philip Gates is a 66 y.o. male who presents to the clinic today for:   HPI     Post-op Follow-up           Laterality: right eye   Discomfort: floaters.  Negative for pain, itching, foreign body sensation, tearing and discharge   Vision: is improved         Comments   1 week and 5 days (12 days) PO OD, OCT, Color FP. SX 01/29/2022. (Rescheduled from yesterday).  Patient reports "there are still floaters. Improved vision in the right eye." Patient is still using prescribed eye drops QID OD Prednisolone and Ofloxacin.      Last edited by Laurin Coder on 02/11/2022  9:01 AM.      Referring physician: Pablo Lawrence, NP [place],  [place] [id number]  HISTORICAL INFORMATION:   Selected notes from the MEDICAL RECORD NUMBER       CURRENT MEDICATIONS: No current outpatient medications on file. (Ophthalmic Drugs)   No current facility-administered medications for this visit. (Ophthalmic Drugs)   Current Outpatient Medications (Other)  Medication Sig   Ascorbic Acid (VITAMIN C) 1000 MG tablet Take 1,000 mg by mouth in the morning and at bedtime.   aspirin EC 81 MG tablet Take 81 mg by mouth in the morning. Swallow whole.   benzonatate (TESSALON) 100 MG capsule Take 1 capsule (100 mg total) by mouth 3 (three) times daily as needed for up to 12 doses for cough.   BREZTRI AEROSPHERE 160-9-4.8 MCG/ACT AERO Inhale 1 puff into the lungs in the morning and at bedtime.   cyclobenzaprine (FLEXERIL) 5 MG tablet Take 1 tablet (5 mg total) by mouth 3 (three) times daily as needed for muscle spasms.   dexlansoprazole (DEXILANT) 60 MG capsule Take 60 mg by mouth every morning.    Dutasteride-Tamsulosin HCl 0.5-0.4 MG CAPS Take 1 capsule by mouth in the morning.   famotidine (PEPCID) 20 MG tablet Take 20 mg by  mouth in the morning and at bedtime.   Homeopathic Products (MUSCLE CRAMP COMPLEX PO) Take 1 capsule by mouth in the morning and at bedtime. Cramp Defense Magnesium for Leg Cramps, Muscle Cramps & Muscle Spasms.   IBU 800 MG tablet Take 800 mg by mouth every 8 (eight) hours as needed (pain).   ipratropium (ATROVENT) 0.03 % nasal spray Place into both nostrils.   lamoTRIgine (LAMICTAL) 200 MG tablet Take 1 tablet (200 mg total) by mouth at bedtime.   lisdexamfetamine (VYVANSE) 70 MG capsule Take 1 capsule (70 mg total) by mouth daily.   lisdexamfetamine (VYVANSE) 70 MG capsule Take 1 capsule (70 mg total) by mouth daily.   lisdexamfetamine (VYVANSE) 70 MG capsule Take 1 capsule (70 mg total) by mouth daily.   metoprolol succinate (TOPROL-XL) 50 MG 24 hr tablet Take 50 mg by mouth at bedtime.   Misc Natural Product Nasal (NASAL CLEANSE RINSE MIX) PACK Place 1 Dose into the nose daily. W/Azithromycin   Misc Natural Products (PROSTATE HEALTH) CAPS Take 1 capsule by mouth in the morning and at bedtime.   mometasone (ELOCON) 0.1 % cream Apply 1 application topically daily as needed (skin irritation).   Multiple Vitamins-Minerals (MULTIVITAMIN WITH MINERALS) tablet Take 1 tablet by mouth daily. Multivitamin for Women (needs the  iron)   PROAIR HFA 108 (90 Base) MCG/ACT inhaler Inhale 1-2 puffs into the lungs every 6 (six) hours as needed for shortness of breath or wheezing.   Probiotic Product (DIGESTIVE ADV MULTI-STRAIN PO) Take 1 capsule by mouth in the morning and at bedtime.   promethazine (PHENERGAN) 25 MG tablet Take 25 mg by mouth every 6 (six) hours as needed for nausea or vomiting.   tadalafil (CIALIS) 20 MG tablet Take 20 mg by mouth at bedtime as needed for erectile dysfunction.   telmisartan-hydrochlorothiazide (MICARDIS HCT) 80-12.5 MG tablet Take 1 tablet by mouth daily.   testosterone cypionate (DEPOTESTOTERONE CYPIONATE) 200 MG/ML injection Inject 200 mg into the muscle every 14 (fourteen)  days.    No current facility-administered medications for this visit. (Other)      REVIEW OF SYSTEMS: ROS   Negative for: Constitutional, Gastrointestinal, Neurological, Skin, Genitourinary, Musculoskeletal, HENT, Endocrine, Cardiovascular, Eyes, Respiratory, Psychiatric, Allergic/Imm, Heme/Lymph Last edited by Hurman Horn, MD on 02/11/2022  9:50 AM.       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   Venous insufficiency    Past Surgical History:  Procedure Laterality Date   cerebral stent     COLONOSCOPY N/A 01/02/2017   Procedure: COLONOSCOPY;  Surgeon: Rogene Houston, MD;  [facility]: [facility];  Service: Endoscopy;  Laterality: N/A;   dental implant  09/26/2014   upper and lower dental implant/bridges- permanent   ESOPHAGOGASTRODUODENOSCOPY N/A 01/02/2017   Procedure: ESOPHAGOGASTRODUODENOSCOPY (EGD);  Surgeon: Rogene Houston, MD;  [facility]: [facility];  Service:  Endoscopy;  Laterality: N/A;  910   EYE SURGERY Left Jan. 16, 2017   Cataract   IR ANGIO INTRA EXTRACRAN SEL INTERNAL CAROTID BILAT MOD SED  01/20/2017   IR ANGIO VERTEBRAL SEL VERTEBRAL UNI L MOD SED  01/20/2017   JOINT REPLACEMENT Right    Knee   JOINT REPLACEMENT Left    Hip   NOSE SURGERY     RADIOLOGY WITH ANESTHESIA N/A 12/29/2014   Procedure: Embolization;  Surgeon: Consuella Lose, MD;  Location: [facility];  Service: Radiology;  Laterality: N/A;   SUBCLAVIAN ANGIOGRAM  Nov. 8, 2016   TOTAL HIP ARTHROPLASTY Left 01/03/2013   Procedure: TOTAL HIP ARTHROPLASTY ANTERIOR APPROACH;  Surgeon: Gearlean Alf, MD;  Location: [facility];  Service: Orthopedics;  Laterality: Left;   TOTAL KNEE ARTHROPLASTY Right 10/06/2014   Procedure: RIGHT TOTAL KNEE ARTHROPLASTY;  Surgeon: Gearlean Alf, MD;  Location: [facility];  Service: Orthopedics;  Laterality: Right;   WISDOM TOOTH EXTRACTION      FAMILY HISTORY Family History  Problem Relation Age of Onset   Arrhythmia Mother        Has pacemaker   Hypertension Mother    Heart disease Mother    Mental illness Mother    Varicose Veins Mother    Depression Mother    Colon cancer Father    Cancer Father        Prostate and Colon   Diabetes Father    Hypertension Father    Cancer - Colon Father    Depression Sister     SOCIAL HISTORY Social History   Tobacco Use   Smoking status: Former    Packs/day: 0.50    Types: Cigarettes    Start date: 05/23/2015    Quit date: 10/09/2017    Years since quitting: 4.3   Smokeless tobacco: Former    Types: Snuff   Tobacco comments:    Stated "I smoke off and on"  Vaping Use   Vaping Use: Never used  Substance Use Topics   Alcohol use: No    Comment: last had fireball last night 09/10/21   Drug use: No         OPHTHALMIC EXAM:  Base Eye Exam     Visual Acuity (ETDRS)       Right Left   Dist [place] 20/20 20/20         Tonometry (Tonopen, 9:02 AM)       Right Left   Pressure 15 16          Pupils       Pupils Dark Light APD   Right PERRL 2 1 None   Left PERRL 2 1 None         Extraocular Movement       Right Left    Full Full         Neuro/Psych     Oriented x3: Yes   Mood/Affect: Normal         Dilation     Right eye: 1.0% Mydriacyl, 2.5% Phenylephrine @ 9:02 AM           Slit Lamp and Fundus Exam     External Exam       Right Left   External Normal Normal         Slit Lamp Exam       Right Left   Lids/Lashes Normal Normal   Conjunctiva/Sclera White and quiet White and quiet   Cornea Clear Clear   Anterior Chamber Deep and quiet Deep and quiet   Iris Round and reactive Round and reactive   Lens Centered posterior chamber intraocular lens Centered posterior chamber intraocular lens   Anterior Vitreous Normal Normal         Fundus Exam       Right Left   Posterior Vitreous clear, avitric    Disc Normal    C/D Ratio 0.3    Macula Normal    Vessels Normal    Periphery Normal, no holes or tears             IMAGING AND PROCEDURES  Imaging and Procedures for 02/11/22  OCT, Retina - OU - Both Eyes       Right Eye Quality  was good. Scan locations included subfoveal. Central Foveal Thickness: 262. Progression has been stable. Findings include normal foveal contour.   Left Eye Quality was good. Scan locations included subfoveal. Central Foveal Thickness: 290. Progression has been stable. Findings include normal foveal contour.      Color Fundus Photography Optos - OU - Both Eyes       Right Eye Progression has been stable. Disc findings include normal observations. Macula : normal observations. Vessels : normal observations. Periphery : normal observations.   Left Eye Progression has been stable. Macula : normal observations. Vessels : normal observations. Periphery : normal observations.              ASSESSMENT/PLAN:  Vitreous membranes and strands, right eye OD, great post vitrectomy membrane  strands resolved.     ICD-10-CM   1. Vitreous membranes and strands, right eye  H43.311 OCT, Retina - OU - Both Eyes    Color Fundus Photography Optos - OU - Both Eyes      1.  OD looks great postop vitrectomy for vitreous membranes and strands symptomatic.  Resolved  2.  3.  Ophthalmic Meds Ordered this visit:  No orders of the defined types were placed in this encounter.      Return in about 4 months (around 06/13/2022) for DILATE OU, COLOR FP, OCT.  Patient Instructions  Ofloxacin  4 times daily to the operative eye  Prednisolone acetate 1 drop to the operative eye 4 times daily  Patient instructed not to refill the medications and use them for maximum of 3 weeks.  Patient instructed do not rub the eye.  Patient has the option to use the patch at night.   Patient to complete medications right eye in 9 days  Patient may return to full activity   Explained the diagnoses, plan, and follow up with the patient and they expressed understanding.  Patient expressed understanding of the importance of proper follow up care.   Clent Demark Shanautica Forker M.D. Diseases & Surgery of the Retina and Vitreous [facility] [place] 02/11/22     Abbreviations: M myopia (nearsighted); A astigmatism; H hyperopia (farsighted); P presbyopia; Mrx spectacle prescription;  CTL contact lenses; OD right eye; OS left eye; OU both eyes  XT exotropia; ET esotropia; PEK punctate epithelial keratitis; PEE punctate epithelial erosions; DES dry eye syndrome; MGD meibomian gland dysfunction; ATs artificial tears; PFAT's preservative free artificial tears; [place] nuclear sclerotic cataract; PSC posterior subcapsular cataract; ERM epi-retinal membrane; PVD posterior vitreous detachment; RD retinal detachment; DM diabetes mellitus; DR diabetic retinopathy; NPDR non-proliferative diabetic retinopathy; PDR proliferative diabetic retinopathy; CSME clinically significant macular edema; DME diabetic macular edema; dbh  dot blot hemorrhages; CWS cotton wool spot; POAG primary open angle glaucoma; C/D cup-to-disc ratio; HVF humphrey visual field; GVF goldmann visual field; OCT optical coherence tomography; IOP intraocular pressure; BRVO Branch retinal vein occlusion; CRVO central retinal vein occlusion; CRAO central retinal artery occlusion; BRAO branch retinal artery occlusion; RT retinal tear; SB scleral buckle; PPV pars plana vitrectomy; VH Vitreous hemorrhage; PRP panretinal laser photocoagulation; IVK intravitreal kenalog; VMT vitreomacular traction; MH Macular hole;  NVD neovascularization of the disc; NVE neovascularization elsewhere; AREDS age related eye disease study; ARMD age related macular degeneration; POAG primary open angle glaucoma; EBMD epithelial/anterior basement membrane dystrophy; ACIOL anterior chamber intraocular lens; IOL intraocular lens; PCIOL posterior chamber intraocular lens; Phaco/IOL phacoemulsification with intraocular lens placement; [place] photorefractive keratectomy; LASIK laser assisted in situ keratomileusis; HTN hypertension; DM diabetes mellitus; COPD chronic  obstructive pulmonary disease

## 2022-02-11 NOTE — Assessment & Plan Note (Signed)
OD, great post vitrectomy membrane strands resolved.

## 2022-02-11 NOTE — Patient Instructions (Signed)
Ofloxacin  4 times daily to the operative eye  Prednisolone acetate 1 drop to the operative eye 4 times daily  Patient instructed not to refill the medications and use them for maximum of 3 weeks.  Patient instructed do not rub the eye.  Patient has the option to use the patch at night.   Patient to complete medications right eye in 9 days  Patient may return to full activity

## 2022-02-13 ENCOUNTER — Ambulatory Visit (INDEPENDENT_AMBULATORY_CARE_PROVIDER_SITE_OTHER): Payer: PPO

## 2022-02-20 ENCOUNTER — Encounter (INDEPENDENT_AMBULATORY_CARE_PROVIDER_SITE_OTHER): Payer: PPO | Admitting: Ophthalmology

## 2022-03-17 ENCOUNTER — Ambulatory Visit: Payer: PPO

## 2022-05-06 ENCOUNTER — Telehealth (INDEPENDENT_AMBULATORY_CARE_PROVIDER_SITE_OTHER): Payer: PPO | Admitting: Psychiatry

## 2022-05-06 ENCOUNTER — Encounter (HOSPITAL_COMMUNITY): Payer: Self-pay | Admitting: Psychiatry

## 2022-05-06 DIAGNOSIS — F900 Attention-deficit hyperactivity disorder, predominantly inattentive type: Secondary | ICD-10-CM | POA: Diagnosis not present

## 2022-05-06 DIAGNOSIS — F3162 Bipolar disorder, current episode mixed, moderate: Secondary | ICD-10-CM | POA: Diagnosis not present

## 2022-05-06 MED ORDER — LISDEXAMFETAMINE DIMESYLATE 70 MG PO CAPS
70.0000 mg | ORAL_CAPSULE | Freq: Every day | ORAL | 0 refills | Status: DC
Start: 2022-05-06 — End: 2022-08-04

## 2022-05-06 MED ORDER — LISDEXAMFETAMINE DIMESYLATE 70 MG PO CAPS: 70.0000 mg | ORAL_CAPSULE | Freq: Every day | ORAL | 0 refills | Status: DC

## 2022-05-06 MED ORDER — LAMOTRIGINE 200 MG PO TABS: 200.0000 mg | ORAL_TABLET | Freq: Every day | ORAL | 2 refills | Status: DC

## 2022-05-06 NOTE — Progress Notes (Signed)
Virtual Visit via Telephone Note  I connected with Philip Gates on 05/06/22 at  9:20 AM EDT by telephone and verified that I am speaking with the correct person using two identifiers.  Location: Patient: home Provider: office   I discussed the limitations, risks, security and privacy concerns of performing an evaluation and management service by telephone and the availability of in person appointments. I also discussed with the patient that there may be a patient responsible charge related to this service. The patient expressed understanding and agreed to proceed.      I discussed the assessment and treatment plan with the patient. The patient was provided an opportunity to ask questions and all were answered. The patient agreed with the plan and demonstrated an understanding of the instructions.   The patient was advised to call back or seek an in-person evaluation if the symptoms worsen or if the condition fails to improve as anticipated.  I provided 15 minutes of non-face-to-face time during this encounter.   [name], MD  [facility] MD/PA/NP OP Progress Note  05/06/2022 9:37 AM Philip Gates  MRN:  [id number]  Chief Complaint:  Chief Complaint  Patient presents with   ADD   Manic Behavior   Follow-up   HPI: This patient is a 66 year old married white male who lives with his wife in [place].  He used to work in [employer], [employer] [place] but has been retired for several years.  The patient returns for follow-up after 3 months regarding his mood swings and ADHD.  He states he has been doing well.  He has been going to physical therapy for his back and neck pain and its really helping.  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  He is staying very active and busy.  He states his mood has been very good.  He denies depression or manic symptoms such as agitation or anger.  He is focusing well with the  Vyvanse. Visit Diagnosis:    ICD-10-CM   1. Attention deficit hyperactivity disorder (ADHD), predominantly inattentive type  F90.0     2. 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   Venous insufficiency     Past Surgical History:  Procedure Laterality Date   cerebral stent     COLONOSCOPY N/A 01/02/2017   Procedure: COLONOSCOPY;  Surgeon: Rogene Houston, MD;  [facility];  Service: Endoscopy;  Laterality: N/A;   dental implant  09/26/2014   upper and lower dental implant/bridges- permanent   ESOPHAGOGASTRODUODENOSCOPY N/A 01/02/2017   Procedure: ESOPHAGOGASTRODUODENOSCOPY (EGD);  Surgeon: Rogene Houston, MD;  [facility];  Service: Endoscopy;  Laterality: N/A;  910   EYE SURGERY Left Jan. 16, 2017   Cataract   IR ANGIO INTRA EXTRACRAN SEL INTERNAL CAROTID BILAT MOD SED  01/20/2017   IR ANGIO VERTEBRAL SEL VERTEBRAL UNI L MOD SED  01/20/2017   JOINT REPLACEMENT Right    Knee   JOINT REPLACEMENT Left    Hip   NOSE SURGERY     RADIOLOGY WITH ANESTHESIA N/A 12/29/2014   Procedure: Embolization;  Surgeon: Consuella Lose, MD;  Location: [place];  Service: Radiology;  Laterality: N/A;    SUBCLAVIAN ANGIOGRAM  Nov. 8, 2016   TOTAL HIP ARTHROPLASTY Left 01/03/2013   Procedure: TOTAL HIP ARTHROPLASTY ANTERIOR APPROACH;  Surgeon: Gearlean Alf, MD;  Location: [facility];  Service: Orthopedics;  Laterality: Left;   TOTAL KNEE ARTHROPLASTY Right 10/06/2014   Procedure: RIGHT TOTAL KNEE ARTHROPLASTY;  Surgeon: Gearlean Alf, MD;  Location: [facility];  Service: Orthopedics;  Laterality: Right;   WISDOM TOOTH EXTRACTION      Family Psychiatric History: See below  Family History:  Family History  Problem Relation Age of Onset   Arrhythmia Mother        Has pacemaker   Hypertension Mother    Heart disease Mother    Mental illness Mother    Varicose Veins Mother    Depression Mother    Colon cancer Father    Cancer Father        Prostate and Colon   Diabetes Father    Hypertension Father    Cancer - Colon Father    Depression Sister     Social History:  Social History   Socioeconomic History   Marital status: Married    Spouse name: Not on file   Number of children: Not on file   Years of education: Not on file   Highest education level: Not on file  Occupational History   Not on file  Tobacco Use   Smoking status: Former    Packs/day: 0.50    Types: Cigarettes    Start date: 05/23/2015    Quit date: 10/09/2017    Years since quitting: 4.5   Smokeless tobacco: Former    Types: Snuff   Tobacco comments:    Stated "I smoke off and on"  Vaping Use   Vaping Use: Never used  Substance and Sexual Activity   Alcohol use: No    Comment: last had fireball last night 09/10/21   Drug use: No   Sexual activity: Yes    Partners: Female  Other Topics Concern   Not on file  Social History Narrative   Not on file   Social Determinants of Health   Financial Resource Strain: Not on file  Food Insecurity: Not on file  Transportation Needs: Not on file  Physical Activity: Not on file  Stress: Not on file  Social Connections: Not on file    Allergies:  Allergies   Allergen Reactions   Naltrexone Anaphylaxis   Clindamycin Other (See Comments)    Abdominal pain "twisted stomach" Abdominal pain "twisted stomach" Abdominal pain "twisted stomach" Abdominal pain "twisted stomach" Abdominal pain "twisted stomach"   Iodine Other (See Comments)   Erythromycin     Caused a "twisted stomach"   Penicillins Rash    Metabolic Disorder Labs: No results  found for: "HGBA1C", "MPG" No results found for: "PROLACTIN" No results found for: "CHOL", "TRIG", "HDL", "CHOLHDL", "VLDL", "LDLCALC" No results found for: "TSH"  Therapeutic Level Labs: No results found for: "LITHIUM" No results found for: "VALPROATE" No results found for: "CBMZ"  Current Medications: Current Outpatient Medications  Medication Sig Dispense Refill   Ascorbic Acid (VITAMIN C) 1000 MG tablet Take 1,000 mg by mouth in the morning and at bedtime.     aspirin EC 81 MG tablet Take 81 mg by mouth in the morning. Swallow whole.     benzonatate (TESSALON) 100 MG capsule Take 1 capsule (100 mg total) by mouth 3 (three) times daily as needed for up to 12 doses for cough. 12 capsule 0   BREZTRI AEROSPHERE 160-9-4.8 MCG/ACT AERO Inhale 1 puff into the lungs in the morning and at bedtime.     cyclobenzaprine (FLEXERIL) 5 MG tablet Take 1 tablet (5 mg total) by mouth 3 (three) times daily as needed for muscle spasms. 30 tablet 0   dexlansoprazole (DEXILANT) 60 MG capsule Take 60 mg by mouth every morning.      Dutasteride-Tamsulosin HCl 0.5-0.4 MG CAPS Take 1 capsule by mouth in the morning.     famotidine (PEPCID) 20 MG tablet Take 20 mg by mouth in the morning and at bedtime.     Homeopathic Products (MUSCLE CRAMP COMPLEX PO) Take 1 capsule by mouth in the morning and at bedtime. Cramp Defense Magnesium for Leg Cramps, Muscle Cramps & Muscle Spasms.     IBU 800 MG tablet Take 800 mg by mouth every 8 (eight) hours as needed (pain).     ipratropium (ATROVENT) 0.03 % nasal spray Place into both  nostrils.     lamoTRIgine (LAMICTAL) 200 MG tablet Take 1 tablet (200 mg total) by mouth at bedtime. 30 tablet 2   lisdexamfetamine (VYVANSE) 70 MG capsule Take 1 capsule (70 mg total) by mouth daily. 30 capsule 0   lisdexamfetamine (VYVANSE) 70 MG capsule Take 1 capsule (70 mg total) by mouth daily. 30 capsule 0   lisdexamfetamine (VYVANSE) 70 MG capsule Take 1 capsule (70 mg total) by mouth daily. 30 capsule 0   metoprolol succinate (TOPROL-XL) 50 MG 24 hr tablet Take 50 mg by mouth at bedtime.     Misc Natural Product Nasal (NASAL CLEANSE RINSE MIX) PACK Place 1 Dose into the nose daily. W/Azithromycin     Misc Natural Products (PROSTATE HEALTH) CAPS Take 1 capsule by mouth in the morning and at bedtime.     mometasone (ELOCON) 0.1 % cream Apply 1 application topically daily as needed (skin irritation).     Multiple Vitamins-Minerals (MULTIVITAMIN WITH MINERALS) tablet Take 1 tablet by mouth daily. Multivitamin for Women (needs the iron)     PROAIR HFA 108 (90 Base) MCG/ACT inhaler Inhale 1-2 puffs into the lungs every 6 (six) hours as needed for shortness of breath or wheezing.     Probiotic Product (DIGESTIVE ADV MULTI-STRAIN PO) Take 1 capsule by mouth in the morning and at bedtime.     promethazine (PHENERGAN) 25 MG tablet Take 25 mg by mouth every 6 (six) hours as needed for nausea or vomiting.     tadalafil (CIALIS) 20 MG tablet Take 20 mg by mouth at bedtime as needed for erectile dysfunction.     telmisartan-hydrochlorothiazide (MICARDIS HCT) 80-12.5 MG tablet Take 1 tablet by mouth daily.     testosterone cypionate (DEPOTESTOTERONE CYPIONATE) 200 MG/ML injection Inject 200 mg into the muscle every  14 (fourteen) days.      No current facility-administered medications for this visit.     Musculoskeletal: Strength & Muscle Tone: na Gait & Station: na Patient leans: N/A  Psychiatric Specialty Exam: Review of Systems  Musculoskeletal:  Positive for back pain and neck pain.  All  other systems reviewed and are negative.   There were no vitals taken for this visit.There is no height or weight on file to calculate BMI.  General Appearance: NA  Eye Contact:  NA  Speech:  Clear and Coherent  Volume:  Normal  Mood:  Euthymic  Affect:  NA  Thought Process:  Goal Directed  Orientation:  Full (Time, Place, and Person)  Thought Content: WDL   Suicidal Thoughts:  No  Homicidal Thoughts:  No  Memory:  Immediate;   Good Recent;   Good Remote;   Good  Judgement:  Good  Insight:  Fair  Psychomotor Activity:  Normal  Concentration:  Concentration: Good and Attention Span: Good  Recall:  Good  Fund of Knowledge: Good  Language: Good  Akathisia:  No  Handed:  Right  AIMS (if indicated): not done  Assets:  Communication Skills Desire for Improvement Resilience Social Support Talents/Skills  ADL's:  Intact  Cognition: WNL  Sleep:  Good   Screenings: PHQ2-9    Flowsheet Row Video Visit from 05/06/2022 in [facility] Video Visit from 02/04/2022 in [facility] Video Visit from 08/14/2021 in [facility] Video Visit from 05/20/2021 in [facility]  PHQ-2 Total Score 0 0 0 0      Flowsheet Row Video Visit from 05/06/2022 in [facility] Video Visit from 02/04/2022 in [facility] from 10/22/2021 in [facility] No Risk No Risk No Risk        Assessment and Plan: This patient is a 66 year old male with a history of bipolar disorder and ADHD.  He continues to do well.  He will continue Lamictal 200 mg daily for mood stabilization and Vyvanse 70 mg every morning for ADHD.  He will return to see me in 3 months.  Collaboration of Care: Collaboration of Care: Primary Care Provider  AEB notes will be shared with PCP at patient's request  Patient/Guardian was advised Release of Information must be obtained prior to any record release in order to collaborate their care with an outside provider. Patient/Guardian was advised if they have not already done so to contact the registration department to sign all necessary forms in order for [place] to release information regarding their care.   Consent: Patient/Guardian gives verbal consent for treatment and assignment of benefits for services provided during this visit. Patient/Guardian expressed understanding and agreed to proceed.    [name], MD 05/06/2022, 9:37 AM

## 2022-05-07 ENCOUNTER — Ambulatory Visit: Payer: PPO | Admitting: Vascular Surgery

## 2022-05-15 ENCOUNTER — Telehealth (HOSPITAL_COMMUNITY): Payer: Self-pay

## 2022-05-15 ENCOUNTER — Ambulatory Visit (INDEPENDENT_AMBULATORY_CARE_PROVIDER_SITE_OTHER): Payer: PPO | Admitting: Vascular Surgery

## 2022-05-15 ENCOUNTER — Encounter: Payer: Self-pay | Admitting: Vascular Surgery

## 2022-05-15 VITALS — BP 107/71 | HR 100 | Temp 98.2°F | Resp 20 | Ht 71.0 in | Wt 196.0 lb

## 2022-05-15 DIAGNOSIS — I83813 Varicose veins of bilateral lower extremities with pain: Secondary | ICD-10-CM

## 2022-05-15 DIAGNOSIS — I872 Venous insufficiency (chronic) (peripheral): Secondary | ICD-10-CM

## 2022-05-15 NOTE — Progress Notes (Signed)
ASSESSMENT & PLAN   CHRONIC VENOUS INSUFFICIENCY: This patient has CEAP C2 venous disease.  He has some small varicose veins in addition to telangiectasias and reticular veins in the left leg.  He also has telangiectasias on the right and has undergone previous laser ablation of the great saphenous vein on the right.  I looked at his left great saphenous vein myself with the SonoSite and the vein is very small.  I do not think he is a candidate for laser ablation on the left.  We have discussed the importance of leg elevation and the proper positioning for this.  I have encouraged him to continue to wear his compression stockings if he will be standing or sitting for a long period of time.  I have encouraged him to avoid prolonged sitting and standing.  We have discussed importance of exercise specifically walking and water aerobics.  Fortunately he has a healthy weight.  If his varicose veins enlarge in size or symptoms progress significantly and certainly we can reevaluate him in the future.  However currently he is not a candidate for intervention at this time.  REASON FOR CONSULT:    Follow-up of chronic venous insufficiency.  HPI:   Philip Gates is a 66 y.o. male who was seen by Gerri Lins, PA on 02/04/2022 with varicose veins that were painful in the left leg.  Patient had noninvasive studies which showed significant reflux.  He was fitted for thigh-high compression stockings with a gradient of 20 to 30 mmHg, encouraged to elevate his legs, and encouraged to exercise.  Comes in for 55-monthfollow-up visit.  Since he was seen last, he continues to have some aching pain in his legs with prolonged standing.  However his main complaint is he gets pain in the back of his legs and some numbness in his feet when he sitting at a barstool for too long.  He denies any previous history of DVT.  He does wear his compression stocking some which help some.  He does sometimes elevate his legs.  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   Venous insufficiency     Family History  Problem Relation Age of Onset   Arrhythmia Mother        Has pacemaker   Hypertension Mother    Heart disease Mother    Mental illness Mother    Varicose Veins Mother    Depression Mother    Colon cancer Father    Cancer Father        Prostate and Colon   Diabetes Father    Hypertension Father    Cancer - Colon Father    Depression Sister  SOCIAL HISTORY: Social History   Tobacco Use   Smoking status: Former    Packs/day: 0.50    Types: Cigarettes    Start date: 05/23/2015    Quit date: 10/09/2017    Years since quitting: 4.6   Smokeless tobacco: Former    Types: Snuff   Tobacco comments:    Stated "I smoke off and on"  Substance Use Topics   Alcohol use: No    Comment: last had fireball last night 09/10/21    Allergies  Allergen Reactions   Naltrexone Anaphylaxis   Clindamycin Other (See Comments)    Abdominal pain "twisted stomach" Abdominal pain "twisted stomach" Abdominal pain "twisted stomach" Abdominal pain "twisted stomach" Abdominal pain "twisted stomach"   Iodine Other (See Comments)   Erythromycin     Caused a "twisted stomach"   Penicillins Rash    Current Outpatient Medications  Medication Sig Dispense Refill   Ascorbic Acid (VITAMIN C) 1000 MG  tablet Take 1,000 mg by mouth in the morning and at bedtime.     aspirin EC 81 MG tablet Take 81 mg by mouth in the morning. Swallow whole.     benzonatate (TESSALON) 100 MG capsule Take 1 capsule (100 mg total) by mouth 3 (three) times daily as needed for up to 12 doses for cough. 12 capsule 0   BREZTRI AEROSPHERE 160-9-4.8 MCG/ACT AERO Inhale 1 puff into the lungs in the morning and at bedtime.     cyclobenzaprine (FLEXERIL) 5 MG tablet Take 1 tablet (5 mg total) by mouth 3 (three) times daily as needed for muscle spasms. 30 tablet 0   dexlansoprazole (DEXILANT) 60 MG capsule Take 60 mg by mouth every morning.      Dutasteride-Tamsulosin HCl 0.5-0.4 MG CAPS Take 1 capsule by mouth in the morning.     famotidine (PEPCID) 20 MG tablet Take 20 mg by mouth in the morning and at bedtime.     Homeopathic Products (MUSCLE CRAMP COMPLEX PO) Take 1 capsule by mouth in the morning and at bedtime. Cramp Defense Magnesium for Leg Cramps, Muscle Cramps & Muscle Spasms.     IBU 800 MG tablet Take 800 mg by mouth every 8 (eight) hours as needed (pain).     ipratropium (ATROVENT) 0.03 % nasal spray Place into both nostrils.     lamoTRIgine (LAMICTAL) 200 MG tablet Take 1 tablet (200 mg total) by mouth at bedtime. 30 tablet 2   lisdexamfetamine (VYVANSE) 70 MG capsule Take 1 capsule (70 mg total) by mouth daily. 30 capsule 0   lisdexamfetamine (VYVANSE) 70 MG capsule Take 1 capsule (70 mg total) by mouth daily. 30 capsule 0   lisdexamfetamine (VYVANSE) 70 MG capsule Take 1 capsule (70 mg total) by mouth daily. 30 capsule 0   metoprolol succinate (TOPROL-XL) 50 MG 24 hr tablet Take 50 mg by mouth at bedtime.     Misc Natural Product Nasal (NASAL CLEANSE RINSE MIX) PACK Place 1 Dose into the nose daily. W/Azithromycin     mometasone (ELOCON) 0.1 % cream Apply 1 application topically daily as needed (skin irritation).     Multiple Vitamins-Minerals (MULTIVITAMIN WITH MINERALS) tablet Take 1 tablet by mouth daily.  Multivitamin for Women (needs the iron)     PROAIR HFA 108 (90 Base) MCG/ACT inhaler Inhale 1-2 puffs into the lungs every 6 (six) hours as needed for shortness of breath or wheezing.     promethazine (PHENERGAN) 25 MG tablet Take 25 mg by mouth every 6 (six) hours  as needed for nausea or vomiting.     tadalafil (CIALIS) 20 MG tablet Take 20 mg by mouth at bedtime as needed for erectile dysfunction.     telmisartan-hydrochlorothiazide (MICARDIS HCT) 80-12.5 MG tablet Take 1 tablet by mouth daily.     testosterone cypionate (DEPOTESTOTERONE CYPIONATE) 200 MG/ML injection Inject 200 mg into the muscle every 14 (fourteen) days.      No current facility-administered medications for this visit.    REVIEW OF SYSTEMS:  '[X]'$  denotes positive finding, '[ ]'$  denotes negative finding Cardiac  Comments:  Chest pain or chest pressure:    Shortness of breath upon exertion:    Short of breath when lying flat:    Irregular heart rhythm:        Vascular    Pain in calf, thigh, or hip brought on by ambulation:    Pain in feet at night that wakes you up from your sleep:     Blood clot in your veins:    Leg swelling:  x       Pulmonary    Oxygen at home:    Productive cough:     Wheezing:         Neurologic    Sudden weakness in arms or legs:     Sudden numbness in arms or legs:     Sudden onset of difficulty speaking or slurred speech:    Temporary loss of vision in one eye:     Problems with dizziness:         Gastrointestinal    Blood in stool:     Vomited blood:         Genitourinary    Burning when urinating:     Blood in urine:        Psychiatric    Major depression:         Hematologic    Bleeding problems:    Problems with blood clotting too easily:        Skin    Rashes or ulcers:        Constitutional    Fever or chills:    -  PHYSICAL EXAM:   Vitals:   05/15/22 0847  BP: 107/71  Pulse: 100  Resp: 20  Temp: 98.2 F (36.8 C)  SpO2: 95%  Weight: 196 lb (88.9 kg)   Height: '5\' 11"'$  (1.803 m)   Body mass index is 27.34 kg/m. GENERAL: The patient is a well-nourished male, in no acute distress. The vital signs are documented above. CARDIAC: There is a regular rate and rhythm.  VASCULAR: I do not detect carotid bruits. He has palpable dorsalis pedis and posterior tibial pulses bilaterally. He has some small varicose veins in the left thigh and leg as documented in the photograph below.  He also has lots of telangiectasias and reticular veins.   I did look at his left great saphenous vein myself with the SonoSite and the vein is not large at all.  By my measurements it was only about 2-1/2 mm  PULMONARY: There is good air exchange bilaterally without wheezing or rales. ABDOMEN: Soft and non-tender with normal pitched bowel sounds.  MUSCULOSKELETAL: There are no major deformities. NEUROLOGIC: No focal weakness or paresthesias are detected. SKIN: There are no ulcers or rashes noted. PSYCHIATRIC: The patient has a normal affect.  DATA:    VENOUS DUPLEX: I have reviewed his previous venous duplex scan that was done on 02/04/2022.  The results are shown in the diagram below.  Deitra [facility]

## 2022-05-15 NOTE — Telephone Encounter (Signed)
Pt states that his pharmacy is out of 70 mg vyvance but they have the 64s. Wanted to know if he can get an Rx for that.

## 2022-05-19 ENCOUNTER — Telehealth (HOSPITAL_COMMUNITY): Payer: Self-pay | Admitting: *Deleted

## 2022-05-19 MED ORDER — LISDEXAMFETAMINE DIMESYLATE 60 MG PO CAPS: 60.0000 mg | ORAL_CAPSULE | Freq: Every day | ORAL | 0 refills | Status: DC

## 2022-05-19 NOTE — Telephone Encounter (Signed)
Patient came into office and stated his pharmacy that provider sent his Vyvanse no long have the 70 mg in stock. Per pt it was his fault for waiting this long to get the script from the pharmacy.  Staff called the pharmacy to see which strength they have in stock and was told by the pharmacist they have the '60mg'$  and the '10mg'$  in stock but patient.   Staff informed patient that pharmacy stated the '60mg'$  and the '10mg'$  will total to $198. Per patient he will get the '60mg'$  for now and come back next week to get the '10mg'$  when he gets the medication.   Per pt he would like for provider to please send in the '60mg'$  and the '10mg'$  to the [facility] to total his regular amount of '70mg'$ .

## 2022-05-19 NOTE — Addendum Note (Signed)
Addended by: Berniece Andreas T on: 05/19/2022 03:38 PM   Modules accepted: Orders

## 2022-05-19 NOTE — Telephone Encounter (Signed)
Send 60 mg Vyvanse for now. He need to call next week to Dr Harrington Challenger if he need 10 mg Vyvanse.

## 2022-05-20 NOTE — Telephone Encounter (Signed)
Patient is aware and verbalized understanding.

## 2022-05-29 ENCOUNTER — Other Ambulatory Visit (HOSPITAL_COMMUNITY): Payer: Self-pay | Admitting: Psychiatry

## 2022-05-29 MED ORDER — LISDEXAMFETAMINE DIMESYLATE 60 MG PO CAPS: 60.0000 mg | ORAL_CAPSULE | ORAL | 0 refills | Status: DC

## 2022-05-29 NOTE — Telephone Encounter (Signed)
sent 

## 2022-05-29 NOTE — Telephone Encounter (Signed)
Pt states that his pharmacy is out of 70 mg vyvance but they have the 20s. Wanted to know if he can get an Rx for that. Patient pharmacy is [facility].

## 2022-06-09 ENCOUNTER — Ambulatory Visit (INDEPENDENT_AMBULATORY_CARE_PROVIDER_SITE_OTHER): Payer: PPO | Admitting: Ophthalmology

## 2022-06-09 ENCOUNTER — Encounter (INDEPENDENT_AMBULATORY_CARE_PROVIDER_SITE_OTHER): Payer: Self-pay | Admitting: Ophthalmology

## 2022-06-09 DIAGNOSIS — Z961 Presence of intraocular lens: Secondary | ICD-10-CM

## 2022-06-09 DIAGNOSIS — H43311 Vitreous membranes and strands, right eye: Secondary | ICD-10-CM

## 2022-06-09 NOTE — Assessment & Plan Note (Signed)
OD, doing well,stable over time

## 2022-06-09 NOTE — Assessment & Plan Note (Signed)
Under the care of Dr. Katy Fitch, stable OU

## 2022-06-09 NOTE — Progress Notes (Signed)
06/09/2022     CHIEF COMPLAINT Patient presents for  Chief Complaint  Patient presents with   Retina Follow Up      HISTORY OF PRESENT ILLNESS: Philip Gates is a 66 y.o. male who presents to the clinic today for:   HPI     Retina Follow Up           Diagnosis: Other   Laterality: right eye   Severity: moderate   Course: stable         Comments   3 MOS FOR DILATE OU, COLOR FP, OCT. Pt stated vision is stable since last visit. Pt denies new floaters and FOL. Pt has allergy to Enthromycin. Pt states, "Sometimes in the corner of my eye, it gets a little bleary. It comes and goes."        Last edited by Silvestre Moment on 06/09/2022  9:59 AM.      Referring physician: Clent Jacks, MD [address] [place],  [place] [address]  HISTORICAL INFORMATION:   Selected notes from the MEDICAL RECORD NUMBER       CURRENT MEDICATIONS: No current outpatient medications on file. (Ophthalmic Drugs)   No current facility-administered medications for this visit. (Ophthalmic Drugs)   Current Outpatient Medications (Other)  Medication Sig   Ascorbic Acid (VITAMIN C) 1000 MG tablet Take 1,000 mg by mouth in the morning and at bedtime.   aspirin EC 81 MG tablet Take 81 mg by mouth in the morning. Swallow whole.   benzonatate (TESSALON) 100 MG capsule Take 1 capsule (100 mg total) by mouth 3 (three) times daily as needed for up to 12 doses for cough.   BREZTRI AEROSPHERE 160-9-4.8 MCG/ACT AERO Inhale 1 puff into the lungs in the morning and at bedtime.   cyclobenzaprine (FLEXERIL) 5 MG tablet Take 1 tablet (5 mg total) by mouth 3 (three) times daily as needed for muscle spasms.   dexlansoprazole (DEXILANT) 60 MG capsule Take 60 mg by mouth every morning.    Dutasteride-Tamsulosin HCl 0.5-0.4 MG CAPS Take 1 capsule by mouth in the morning.   famotidine (PEPCID) 20 MG tablet Take 20 mg by mouth in the morning and at bedtime.   Homeopathic Products (MUSCLE CRAMP COMPLEX PO) Take 1  capsule by mouth in the morning and at bedtime. Cramp Defense Magnesium for Leg Cramps, Muscle Cramps & Muscle Spasms.   IBU 800 MG tablet Take 800 mg by mouth every 8 (eight) hours as needed (pain).   ipratropium (ATROVENT) 0.03 % nasal spray Place into both nostrils.   lamoTRIgine (LAMICTAL) 200 MG tablet Take 1 tablet (200 mg total) by mouth at bedtime.   lisdexamfetamine (VYVANSE) 60 MG capsule Take 1 capsule (60 mg total) by mouth daily.   lisdexamfetamine (VYVANSE) 60 MG capsule Take 1 capsule (60 mg total) by mouth every morning.   lisdexamfetamine (VYVANSE) 70 MG capsule Take 1 capsule (70 mg total) by mouth daily.   lisdexamfetamine (VYVANSE) 70 MG capsule Take 1 capsule (70 mg total) by mouth daily.   metoprolol succinate (TOPROL-XL) 50 MG 24 hr tablet Take 50 mg by mouth at bedtime.   Misc Natural Product Nasal (NASAL CLEANSE RINSE MIX) PACK Place 1 Dose into the nose daily. W/Azithromycin   mometasone (ELOCON) 0.1 % cream Apply 1 application topically daily as needed (skin irritation).   Multiple Vitamins-Minerals (MULTIVITAMIN WITH MINERALS) tablet Take 1 tablet by mouth daily. Multivitamin for Women (needs the iron)   PROAIR HFA 108 (  90 Base) MCG/ACT inhaler Inhale 1-2 puffs into the lungs every 6 (six) hours as needed for shortness of breath or wheezing.   promethazine (PHENERGAN) 25 MG tablet Take 25 mg by mouth every 6 (six) hours as needed for nausea or vomiting.   tadalafil (CIALIS) 20 MG tablet Take 20 mg by mouth at bedtime as needed for erectile dysfunction.   telmisartan-hydrochlorothiazide (MICARDIS HCT) 80-12.5 MG tablet Take 1 tablet by mouth daily.   testosterone cypionate (DEPOTESTOTERONE CYPIONATE) 200 MG/ML injection Inject 200 mg into the muscle every 14 (fourteen) days.    No current facility-administered medications for this visit. (Other)      REVIEW OF SYSTEMS: ROS   Negative for: Constitutional, Gastrointestinal, Neurological, Skin, Genitourinary,  Musculoskeletal, HENT, Endocrine, Cardiovascular, Eyes, Respiratory, Psychiatric, Allergic/Imm, Heme/Lymph Last edited by Silvestre Moment on 06/09/2022  9:57 AM.       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   Venous insufficiency    Past Surgical History:  Procedure Laterality Date   cerebral stent     COLONOSCOPY N/A 01/02/2017   Procedure: COLONOSCOPY;  Surgeon: Rogene Houston, MD;  Location: [facility];  Service: Endoscopy;  Laterality: N/A;   dental implant  09/26/2014   upper and lower dental implant/bridges- permanent   ESOPHAGOGASTRODUODENOSCOPY N/A 01/02/2017   Procedure: ESOPHAGOGASTRODUODENOSCOPY (EGD);  Surgeon: Rogene Houston, MD;  [facility]: [facility];  Service: Endoscopy;  Laterality: N/A;  910   EYE SURGERY Left Jan. 16, 2017   Cataract   IR ANGIO INTRA EXTRACRAN SEL INTERNAL CAROTID BILAT MOD SED  01/20/2017   IR ANGIO VERTEBRAL SEL VERTEBRAL UNI L MOD SED   01/20/2017   JOINT REPLACEMENT Right    Knee   JOINT REPLACEMENT Left    Hip   NOSE SURGERY     RADIOLOGY WITH ANESTHESIA N/A 12/29/2014   Procedure: Embolization;  Surgeon: Consuella Lose, MD;  Location: [facility];  Service: Radiology;  Laterality: N/A;   SUBCLAVIAN ANGIOGRAM  Nov. 8, 2016   TOTAL HIP ARTHROPLASTY Left 01/03/2013   Procedure: TOTAL HIP ARTHROPLASTY ANTERIOR APPROACH;  Surgeon: Gearlean Alf, MD;  Location: [facility];  Service: Orthopedics;  Laterality: Left;   TOTAL KNEE ARTHROPLASTY Right 10/06/2014   Procedure: RIGHT TOTAL KNEE ARTHROPLASTY;  Surgeon: Gearlean Alf, MD;  Location:  [facility];  Service: Orthopedics;  Laterality: Right;   WISDOM TOOTH EXTRACTION      FAMILY HISTORY Family History  Problem Relation Age of Onset   Arrhythmia Mother        Has pacemaker   Hypertension Mother    Heart disease Mother    Mental illness Mother    Varicose Veins Mother    Depression Mother    Colon cancer Father    Cancer Father        Prostate and Colon   Diabetes Father    Hypertension Father    Cancer - Colon Father    Depression Sister     SOCIAL HISTORY Social History   Tobacco Use   Smoking status: Former    Packs/day: 0.50    Types: Cigarettes    Start date: 05/23/2015    Quit date: 10/09/2017    Years since quitting: 4.6   Smokeless tobacco: Former    Types: Snuff   Tobacco comments:    Stated "I smoke off and on"  Vaping Use   Vaping Use: Never used  Substance Use Topics   Alcohol use: No    Comment: last had fireball last night 09/10/21   Drug use: No         OPHTHALMIC EXAM:  Base Eye Exam     Visual Acuity (ETDRS)       Right Left   Dist [place] 20/15 -1 20/20 +2         Tonometry (Tonopen, 10:01 AM)       Right Left   Pressure 13 15         Pupils       Pupils APD   Right PERRL None   Left PERRL None         Visual Fields       Left Right    Full Full         Extraocular Movement       Right Left    Full, Ortho  Full, Ortho         Neuro/Psych     Oriented x3: Yes   Mood/Affect: Normal         Dilation     Both eyes: 1.0% Mydriacyl, 2.5% Phenylephrine @ 10:01 AM           Slit Lamp and Fundus Exam     External Exam       Right Left   External Normal Normal         Slit Lamp Exam       Right Left   Lids/Lashes Normal Normal   Conjunctiva/Sclera White and quiet White and quiet   Cornea Clear Clear   Anterior Chamber Deep and quiet Deep and quiet   Iris Round and reactive Round and reactive   Lens Centered posterior chamber intraocular lens Centered posterior chamber intraocular lens   Anterior Vitreous Normal Normal         Fundus Exam       Right Left   Posterior Vitreous clear, avitric Posterior vitreous detachment   Disc Normal Normal   C/D Ratio 0.3 0.3   Macula Normal Normal   Vessels Normal Normal   Periphery Normal, no holes or tears Normal, no holes or tears            IMAGING AND PROCEDURES  Imaging and Procedures for 06/09/22  OCT, Retina - OU - Both Eyes       Right Eye Quality  was good. Scan locations included subfoveal. Central Foveal Thickness: 266. Progression has been stable. Findings include normal foveal contour.   Left Eye Quality was good. Scan locations included subfoveal. Central Foveal Thickness: 289. Progression has been stable. Findings include normal foveal contour.      Color Fundus Photography Optos - OU - Both Eyes       Right Eye Progression has been stable. Disc findings include normal observations. Macula : normal observations. Vessels : normal observations. Periphery : normal observations.   Left Eye Progression has been stable. Macula : normal observations. Vessels : normal observations. Periphery : normal observations.              ASSESSMENT/PLAN:  Vitreous membranes and strands, right eye OD, doing well,stable over time  Pseudophakia, both eyes Under the care of Dr. Katy Fitch, stable OU      ICD-10-CM   1. Vitreous membranes and strands, right eye  H43.311 OCT, Retina - OU - Both Eyes    Color Fundus Photography Optos - OU - Both Eyes    2. Pseudophakia, both eyes  Z96.1       1.  Some 5 months post vitrectomy, with clearance of visual symptoms.  Excellent acuity has been regained.  2.  Follow-up here as needed  3.  Ophthalmic Meds Ordered this visit:  No orders of the defined types were placed in this encounter.      Return in about 1 year (around 06/10/2023) for DILATE OU, OCT.  There are no Patient Instructions on file for this visit.   Explained the diagnoses, plan, and follow up with the patient and they expressed understanding.  Patient expressed understanding of the importance of proper follow up care.   Clent Demark Akiva Josey M.D. Diseases & Surgery of the Retina and Vitreous [facility] [place] 06/09/22     Abbreviations: M myopia (nearsighted); A astigmatism; H hyperopia (farsighted); P presbyopia; Mrx spectacle prescription;  CTL contact lenses; OD right eye; OS left eye; OU both eyes  XT exotropia; ET esotropia; PEK punctate epithelial keratitis; PEE punctate epithelial erosions; DES dry eye syndrome; MGD meibomian gland dysfunction; ATs artificial tears; PFAT's preservative free artificial tears; [place] nuclear sclerotic cataract; PSC posterior subcapsular cataract; ERM epi-retinal membrane; PVD posterior vitreous detachment; RD retinal detachment; DM diabetes mellitus; DR diabetic retinopathy; NPDR non-proliferative diabetic retinopathy; PDR proliferative diabetic retinopathy; CSME clinically significant macular edema; DME diabetic macular edema; dbh dot blot hemorrhages; CWS cotton wool spot; POAG primary open angle glaucoma; C/D cup-to-disc ratio; HVF humphrey visual field; GVF goldmann visual field; OCT optical coherence tomography; IOP intraocular pressure; BRVO Branch retinal vein occlusion; CRVO central retinal vein occlusion; CRAO central retinal  artery occlusion; BRAO branch retinal artery occlusion; RT retinal tear; SB scleral buckle; PPV pars plana vitrectomy; VH Vitreous hemorrhage; PRP panretinal laser photocoagulation; IVK intravitreal kenalog; VMT vitreomacular traction; MH Macular hole;  NVD neovascularization of the disc; NVE neovascularization elsewhere; AREDS age related eye disease study; ARMD age related macular degeneration; POAG primary open angle glaucoma; EBMD epithelial/anterior basement membrane dystrophy; ACIOL anterior chamber intraocular lens; IOL intraocular lens; PCIOL posterior chamber intraocular lens; Phaco/IOL phacoemulsification with intraocular lens placement; [place] photorefractive keratectomy; LASIK laser assisted in situ keratomileusis; HTN hypertension; DM diabetes mellitus; COPD chronic obstructive pulmonary disease

## 2022-06-16 ENCOUNTER — Encounter (INDEPENDENT_AMBULATORY_CARE_PROVIDER_SITE_OTHER): Payer: PPO | Admitting: Ophthalmology

## 2022-07-16 ENCOUNTER — Other Ambulatory Visit (HOSPITAL_COMMUNITY): Payer: Self-pay | Admitting: Psychiatry

## 2022-07-21 ENCOUNTER — Telehealth (HOSPITAL_COMMUNITY): Payer: Self-pay | Admitting: *Deleted

## 2022-07-21 NOTE — Telephone Encounter (Signed)
Patient called stating he is needing refills for his Vyvanse '70mg'$  sent to [facility]. The pharmacy stated they have the Brand name in stock not the Generic.

## 2022-07-22 ENCOUNTER — Other Ambulatory Visit (HOSPITAL_COMMUNITY): Payer: Self-pay | Admitting: Psychiatry

## 2022-07-22 MED ORDER — LISDEXAMFETAMINE DIMESYLATE 70 MG PO CAPS: 70.0000 mg | ORAL_CAPSULE | Freq: Every day | ORAL | 0 refills | Status: DC

## 2022-07-22 NOTE — Telephone Encounter (Signed)
sent 

## 2022-08-04 ENCOUNTER — Encounter (HOSPITAL_COMMUNITY): Payer: Self-pay | Admitting: Psychiatry

## 2022-08-04 ENCOUNTER — Telehealth (INDEPENDENT_AMBULATORY_CARE_PROVIDER_SITE_OTHER): Payer: PPO | Admitting: Psychiatry

## 2022-08-04 DIAGNOSIS — F3162 Bipolar disorder, current episode mixed, moderate: Secondary | ICD-10-CM

## 2022-08-04 DIAGNOSIS — F900 Attention-deficit hyperactivity disorder, predominantly inattentive type: Secondary | ICD-10-CM | POA: Diagnosis not present

## 2022-08-04 MED ORDER — LAMOTRIGINE 200 MG PO TABS: 200.0000 mg | ORAL_TABLET | Freq: Every day | ORAL | 2 refills | Status: DC

## 2022-08-04 MED ORDER — LISDEXAMFETAMINE DIMESYLATE 70 MG PO CAPS: 70.0000 mg | ORAL_CAPSULE | Freq: Every day | ORAL | 0 refills | Status: DC

## 2022-08-04 NOTE — Progress Notes (Signed)
Virtual Visit via Telephone Note  I connected with Philip Gates on 08/04/22 at  9:40 AM EST by telephone and verified that I am speaking with the correct person using two identifiers.  Location: Patient: home Provider: office   I discussed the limitations, risks, security and privacy concerns of performing an evaluation and management service by telephone and the availability of in person appointments. I also discussed with the patient that there may be a patient responsible charge related to this service. The patient expressed understanding and agreed to proceed.      I discussed the assessment and treatment plan with the patient. The patient was provided an opportunity to ask questions and all were answered. The patient agreed with the plan and demonstrated an understanding of the instructions.   The patient was advised to call back or seek an in-person evaluation if the symptoms worsen or if the condition fails to improve as anticipated.  I provided 15 minutes of non-face-to-face time during this encounter.   [name], MD  [facility] MD/PA/NP OP Progress Note  08/04/2022 9:56 AM Philip Gates  MRN:  [id number]  Chief Complaint:  Chief Complaint  Patient presents with   ADHD   Manic Behavior   Follow-up   HPI: This patient is a 66 year old married white male who lives with his wife in [place].  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  The patient returns for follow-up after 3 months regarding his mood swings and ADHD.  Overall he is doing well.  He has to have a TURP procedure as he has been having more trouble urinating.  He is a little bit nervous about this.  He is focusing well with the Vyvanse.  He denies any mood swings irritability or manic episodes and he denies depression.  He states that his mood has been very good.  He continues to deal with chronic back pain. Visit Diagnosis:    ICD-10-CM   1. Attention  deficit hyperactivity disorder (ADHD), predominantly inattentive type  F90.0     2. 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   Venous insufficiency     Past Surgical History:  Procedure Laterality Date   cerebral stent     COLONOSCOPY N/A 01/02/2017   Procedure: COLONOSCOPY;  Surgeon: Rogene Houston, MD;  [facility];  Service: Endoscopy;  Laterality: N/A;   dental implant  09/26/2014   upper and lower dental implant/bridges- permanent  ESOPHAGOGASTRODUODENOSCOPY N/A 01/02/2017   Procedure: ESOPHAGOGASTRODUODENOSCOPY (EGD);  Surgeon: Rogene Houston, MD;  [facility];  Service: Endoscopy;  Laterality: N/A;  910   EYE SURGERY Left Jan. 16, 2017   Cataract   IR ANGIO INTRA EXTRACRAN SEL INTERNAL CAROTID BILAT MOD SED  01/20/2017   IR ANGIO VERTEBRAL SEL VERTEBRAL UNI L MOD SED  01/20/2017   JOINT REPLACEMENT Right    Knee   JOINT REPLACEMENT Left    Hip   NOSE SURGERY     RADIOLOGY WITH ANESTHESIA N/A 12/29/2014   Procedure: Embolization;  Surgeon: Consuella Lose, MD;  Location: [place];  Service: Radiology;  Laterality: N/A;   SUBCLAVIAN ANGIOGRAM  Nov. 8, 2016   TOTAL HIP ARTHROPLASTY  Left 01/03/2013   Procedure: TOTAL HIP ARTHROPLASTY ANTERIOR APPROACH;  Surgeon: Gearlean Alf, MD;  [facility];  Service: Orthopedics;  Laterality: Left;   TOTAL KNEE ARTHROPLASTY Right 10/06/2014   Procedure: RIGHT TOTAL KNEE ARTHROPLASTY;  Surgeon: Gearlean Alf, MD;  [facility];  Service: Orthopedics;  Laterality: Right;   WISDOM TOOTH EXTRACTION      Family Psychiatric History: See below  Family History:  Family History  Problem Relation Age of Onset   Arrhythmia Mother        Has pacemaker   Hypertension Mother    Heart disease Mother    Mental illness Mother    Varicose Veins Mother    Depression Mother    Colon cancer Father    Cancer Father        Prostate and Colon   Diabetes Father    Hypertension Father    Cancer - Colon Father    Depression Sister     Social History:  Social History   Socioeconomic History   Marital status: Married    Spouse name: Not on file   Number of children: Not on file   Years of education: Not on file   Highest education level: Not on file  Occupational History   Not on file  Tobacco Use   Smoking status: Former    Packs/day: 0.50    Types: Cigarettes    Start date: 05/23/2015    Quit date: 10/09/2017    Years since quitting: 4.8   Smokeless tobacco: Former    Types: Snuff   Tobacco comments:    Stated "I smoke off and on"  Vaping Use   Vaping Use: Never used  Substance and Sexual Activity   Alcohol use: No    Comment: last had fireball last night 09/10/21   Drug use: No   Sexual activity: Yes    Partners: Female  Other Topics Concern   Not on file  Social History Narrative   Not on file   Social Determinants of Health   Financial Resource Strain: Not on file  Food Insecurity: Not on file  Transportation Needs: Not on file  Physical Activity: Not on file  Stress: Not on file  Social Connections: Not on file    Allergies:  Allergies  Allergen Reactions   Naltrexone Anaphylaxis   Clindamycin  Other (See Comments)    Abdominal pain "twisted stomach" Abdominal pain "twisted stomach" Abdominal pain "twisted stomach" Abdominal pain "twisted stomach" Abdominal pain "twisted stomach"   Iodine Other (See Comments)   Erythromycin     Caused a "twisted stomach"   Penicillins Rash    Metabolic Disorder Labs: No results found for: "HGBA1C", "MPG" No results found for: "PROLACTIN" No results  found for: "CHOL", "TRIG", "HDL", "CHOLHDL", "VLDL", "LDLCALC" No results found for: "TSH"  Therapeutic Level Labs: No results found for: "LITHIUM" No results found for: "VALPROATE" No results found for: "CBMZ"  Current Medications: Current Outpatient Medications  Medication Sig Dispense Refill   lisdexamfetamine (VYVANSE) 70 MG capsule Take 1 capsule (70 mg total) by mouth daily. 30 capsule 0   Ascorbic Acid (VITAMIN C) 1000 MG tablet Take 1,000 mg by mouth in the morning and at bedtime.     aspirin EC 81 MG tablet Take 81 mg by mouth in the morning. Swallow whole.     benzonatate (TESSALON) 100 MG capsule Take 1 capsule (100 mg total) by mouth 3 (three) times daily as needed for up to 12 doses for cough. 12 capsule 0   BREZTRI AEROSPHERE 160-9-4.8 MCG/ACT AERO Inhale 1 puff into the lungs in the morning and at bedtime.     cyclobenzaprine (FLEXERIL) 5 MG tablet Take 1 tablet (5 mg total) by mouth 3 (three) times daily as needed for muscle spasms. 30 tablet 0   dexlansoprazole (DEXILANT) 60 MG capsule Take 60 mg by mouth every morning.      Dutasteride-Tamsulosin HCl 0.5-0.4 MG CAPS Take 1 capsule by mouth in the morning.     famotidine (PEPCID) 20 MG tablet Take 20 mg by mouth in the morning and at bedtime.     Homeopathic Products (MUSCLE CRAMP COMPLEX PO) Take 1 capsule by mouth in the morning and at bedtime. Cramp Defense Magnesium for Leg Cramps, Muscle Cramps & Muscle Spasms.     IBU 800 MG tablet Take 800 mg by mouth every 8 (eight) hours as needed (pain).     ipratropium (ATROVENT)  0.03 % nasal spray Place into both nostrils.     lamoTRIgine (LAMICTAL) 200 MG tablet Take 1 tablet (200 mg total) by mouth at bedtime. 30 tablet 2   lisdexamfetamine (VYVANSE) 70 MG capsule Take 1 capsule (70 mg total) by mouth daily. 30 capsule 0   lisdexamfetamine (VYVANSE) 70 MG capsule Take 1 capsule (70 mg total) by mouth daily. 30 capsule 0   metoprolol succinate (TOPROL-XL) 50 MG 24 hr tablet Take 50 mg by mouth at bedtime.     Misc Natural Product Nasal (NASAL CLEANSE RINSE MIX) PACK Place 1 Dose into the nose daily. W/Azithromycin     mometasone (ELOCON) 0.1 % cream Apply 1 application topically daily as needed (skin irritation).     Multiple Vitamins-Minerals (MULTIVITAMIN WITH MINERALS) tablet Take 1 tablet by mouth daily. Multivitamin for Women (needs the iron)     PROAIR HFA 108 (90 Base) MCG/ACT inhaler Inhale 1-2 puffs into the lungs every 6 (six) hours as needed for shortness of breath or wheezing.     promethazine (PHENERGAN) 25 MG tablet Take 25 mg by mouth every 6 (six) hours as needed for nausea or vomiting.     tadalafil (CIALIS) 20 MG tablet Take 20 mg by mouth at bedtime as needed for erectile dysfunction.     telmisartan-hydrochlorothiazide (MICARDIS HCT) 80-12.5 MG tablet Take 1 tablet by mouth daily.     testosterone cypionate (DEPOTESTOTERONE CYPIONATE) 200 MG/ML injection Inject 200 mg into the muscle every 14 (fourteen) days.      No current facility-administered medications for this visit.     Musculoskeletal: Strength & Muscle Tone: na Gait & Station: na Patient leans: N/A  Psychiatric Specialty Exam: Review of Systems  Genitourinary:  Positive for difficulty urinating.  Musculoskeletal:  Positive for back pain.  All other systems reviewed and are negative.   There were no vitals taken for this visit.There is no height or weight on file to calculate BMI.  General Appearance: NA  Eye Contact:  NA  Speech:  Clear and Coherent  Volume:  Normal  Mood:   Euthymic  Affect:  NA  Thought Process:  Goal Directed  Orientation:  Full (Time, Place, and Person)  Thought Content: WDL   Suicidal Thoughts:  No  Homicidal Thoughts:  No  Memory:  Immediate;   Good Recent;   Good Remote;   Good  Judgement:  Good  Insight:  Fair  Psychomotor Activity:  Normal  Concentration:  Concentration: Good and Attention Span: Good  Recall:  Good  Fund of Knowledge: Good  Language: Good  Akathisia:  No  Handed:  Right  AIMS (if indicated): not done  Assets:  Communication Skills Desire for Improvement Physical Health Resilience Social Support Talents/Skills  ADL's:  Intact  Cognition: WNL  Sleep:  Good   Screenings: PHQ2-9    Flowsheet Row Video Visit from 05/06/2022 in [facility] Video Visit from 02/04/2022 in [place] Video Visit from 08/14/2021 in [facility] Video Visit from 05/20/2021 in [facility]  PHQ-2 Total Score 0 0 0 0      Flowsheet Row Video Visit from 05/06/2022 in [facility] Video Visit from 02/04/2022 in [place] [facility] from 10/22/2021 in [place] No Risk No Risk No Risk        Assessment and Plan: This patient is a 66 year old male with a history of bipolar disorder and ADHD.  He continues to do well.  He will continue Lamictal 200 mg daily from mood stabilization and Vyvanse 70 mg every morning for ADHD.  He will return to see me in 3 months  Collaboration of Care: Collaboration of Care: Primary Care Provider AEB notes will be shared with PCP at patient's request  Patient/Guardian was advised Release of Information must be obtained prior to any record release in order to collaborate their care with an outside provider.  Patient/Guardian was advised if they have not already done so to contact the registration department to sign all necessary forms in order for [place] to release information regarding their care.   Consent: Patient/Guardian gives verbal consent for treatment and assignment of benefits for services provided during this visit. Patient/Guardian expressed understanding and agreed to proceed.    [name], MD 08/04/2022, 9:56 AM

## 2022-09-03 ENCOUNTER — Other Ambulatory Visit: Payer: Self-pay | Admitting: Adult Health Nurse Practitioner

## 2022-09-03 DIAGNOSIS — M5136 Other intervertebral disc degeneration, lumbar region: Secondary | ICD-10-CM

## 2022-09-03 DIAGNOSIS — M48061 Spinal stenosis, lumbar region without neurogenic claudication: Secondary | ICD-10-CM

## 2022-09-03 DIAGNOSIS — G894 Chronic pain syndrome: Secondary | ICD-10-CM

## 2022-09-04 ENCOUNTER — Ambulatory Visit (HOSPITAL_COMMUNITY)
Admission: RE | Admit: 2022-09-04 | Discharge: 2022-09-04 | Disposition: A | Discharge status: Home / Self Care | Payer: PPO | Source: Ambulatory Visit | Attending: Adult Health Nurse Practitioner | Admitting: Adult Health Nurse Practitioner

## 2022-09-04 DIAGNOSIS — M48061 Spinal stenosis, lumbar region without neurogenic claudication: Secondary | ICD-10-CM | POA: Diagnosis present

## 2022-09-04 DIAGNOSIS — M5136 Other intervertebral disc degeneration, lumbar region: Secondary | ICD-10-CM | POA: Insufficient documentation

## 2022-09-04 DIAGNOSIS — G894 Chronic pain syndrome: Secondary | ICD-10-CM | POA: Diagnosis present

## 2022-09-04 MED ORDER — GADOBUTROL 1 MMOL/ML IV SOLN
9.0000 mL | Freq: Once | INTRAVENOUS | Status: AC | PRN
  Administered 2022-09-04: 9 mL via INTRAVENOUS

## 2022-09-25 ENCOUNTER — Other Ambulatory Visit (HOSPITAL_COMMUNITY): Payer: Self-pay | Admitting: Neurosurgery

## 2022-09-25 DIAGNOSIS — I671 Cerebral aneurysm, nonruptured: Secondary | ICD-10-CM

## 2022-10-14 ENCOUNTER — Other Ambulatory Visit (HOSPITAL_COMMUNITY): Payer: Self-pay | Admitting: Psychiatry

## 2022-10-24 ENCOUNTER — Ambulatory Visit (HOSPITAL_COMMUNITY)
Admission: RE | Admit: 2022-10-24 | Discharge: 2022-10-24 | Disposition: A | Discharge status: Home / Self Care | Payer: PPO | Source: Ambulatory Visit | Attending: Neurosurgery | Admitting: Neurosurgery

## 2022-10-24 DIAGNOSIS — I671 Cerebral aneurysm, nonruptured: Secondary | ICD-10-CM | POA: Diagnosis present

## 2022-10-29 ENCOUNTER — Encounter (HOSPITAL_COMMUNITY): Payer: Self-pay | Admitting: Psychiatry

## 2022-10-29 ENCOUNTER — Telehealth (INDEPENDENT_AMBULATORY_CARE_PROVIDER_SITE_OTHER): Payer: PPO | Admitting: Psychiatry

## 2022-10-29 DIAGNOSIS — F900 Attention-deficit hyperactivity disorder, predominantly inattentive type: Secondary | ICD-10-CM | POA: Diagnosis not present

## 2022-10-29 DIAGNOSIS — F3162 Bipolar disorder, current episode mixed, moderate: Secondary | ICD-10-CM

## 2022-10-29 MED ORDER — LISDEXAMFETAMINE DIMESYLATE 70 MG PO CAPS: 70.0000 mg | ORAL_CAPSULE | Freq: Every day | ORAL | 0 refills | Status: DC

## 2022-10-29 MED ORDER — LAMOTRIGINE 200 MG PO TABS: 200.0000 mg | ORAL_TABLET | Freq: Every day | ORAL | 2 refills | Status: DC

## 2022-10-29 NOTE — Progress Notes (Signed)
Virtual Visit via Telephone Note  I connected with Philip Gates on 10/29/22 at  1:20 PM EST by telephone and verified that I am speaking with the correct person using two identifiers.  Location: Patient: home Provider: office   I discussed the limitations, risks, security and privacy concerns of performing an evaluation and management service by telephone and the availability of in person appointments. I also discussed with the patient that there may be a patient responsible charge related to this service. The patient expressed understanding and agreed to proceed.     I discussed the assessment and treatment plan with the patient. The patient was provided an opportunity to ask questions and all were answered. The patient agreed with the plan and demonstrated an understanding of the instructions.   The patient was advised to call back or seek an in-person evaluation if the symptoms worsen or if the condition fails to improve as anticipated.  I provided 20 minutes of non-face-to-face time during this encounter.   [name], MD  [facility] MD/PA/NP OP Progress Note  10/29/2022 1:34 PM Philip Gates  MRN:  [id number]  Chief Complaint:  Chief Complaint  Patient presents with   ADD   Manic Behavior   Follow-up   HPI: This patient is a 67 year old married white male who lives with his wife in [place]. 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  The patient will follow-up after 3 months regarding his ADHD and mood swings.  Overall he has been doing well.  He has been going to a lot of appointments with his father as his father is in the process of getting diagnosed with lung cancer.  He denies any mood swings irritability or manic episodes or depression.  He states that his mood has been good.  He is focusing well with the Vyvanse.  He he is still dealing with chronic back pain but has had a good response to PT and dry needling  Visit  Diagnosis:    ICD-10-CM   1. Attention deficit hyperactivity disorder (ADHD), predominantly inattentive type  F90.0     2. 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   Venous insufficiency     Past Surgical History:  Procedure Laterality Date   cerebral stent     COLONOSCOPY N/A 01/02/2017   Procedure: COLONOSCOPY;  Surgeon: Rogene Houston, MD;  [facility];  Service: Endoscopy;  Laterality: N/A;   dental implant  09/26/2014  upper and lower dental implant/bridges- permanent   ESOPHAGOGASTRODUODENOSCOPY N/A 01/02/2017   Procedure: ESOPHAGOGASTRODUODENOSCOPY (EGD);  Surgeon: Rogene Houston, MD;  [facility];  Service: Endoscopy;  Laterality: N/A;  910   EYE SURGERY Left Jan. 16, 2017   Cataract   IR ANGIO INTRA EXTRACRAN SEL INTERNAL CAROTID BILAT MOD SED  01/20/2017   IR ANGIO VERTEBRAL SEL VERTEBRAL UNI L MOD SED  01/20/2017   JOINT REPLACEMENT Right    Knee   JOINT REPLACEMENT Left    Hip   NOSE SURGERY     RADIOLOGY WITH ANESTHESIA N/A 12/29/2014   Procedure: Embolization;  Surgeon: Consuella Lose, MD;  Location: [place];  Service: Radiology;  Laterality: N/A;   SUBCLAVIAN ANGIOGRAM   Nov. 8, 2016   TOTAL HIP ARTHROPLASTY Left 01/03/2013   Procedure: TOTAL HIP ARTHROPLASTY ANTERIOR APPROACH;  Surgeon: Gearlean Alf, MD;  [facility];  Service: Orthopedics;  Laterality: Left;   TOTAL KNEE ARTHROPLASTY Right 10/06/2014   Procedure: RIGHT TOTAL KNEE ARTHROPLASTY;  Surgeon: Gearlean Alf, MD;  [facility];  Service: Orthopedics;  Laterality: Right;   WISDOM TOOTH EXTRACTION      Family Psychiatric History: See below  Family History:  Family History  Problem Relation Age of Onset   Arrhythmia Mother        Has pacemaker   Hypertension Mother    Heart disease Mother    Mental illness Mother    Varicose Veins Mother    Depression Mother    Colon cancer Father    Cancer Father        Prostate and Colon   Diabetes Father    Hypertension Father    Cancer - Colon Father    Depression Sister     Social History:  Social History   Socioeconomic History   Marital status: Married    Spouse name: Not on file   Number of children: Not on file   Years of education: Not on file   Highest education level: Not on file  Occupational History   Not on file  Tobacco Use   Smoking status: Former    Packs/day: 0.50    Types: Cigarettes    Start date: 05/23/2015    Quit date: 10/09/2017    Years since quitting: 5.0   Smokeless tobacco: Former    Types: Snuff   Tobacco comments:    Stated "I smoke off and on"  Vaping Use   Vaping Use: Never used  Substance and Sexual Activity   Alcohol use: No    Comment: last had fireball last night 09/10/21   Drug use: No   Sexual activity: Yes    Partners: Female  Other Topics Concern   Not on file  Social History Narrative   Not on file   Social Determinants of Health   Financial Resource Strain: Not on file  Food Insecurity: Not on file  Transportation Needs: Not on file  Physical Activity: Not on file  Stress: Not on file  Social Connections: Not on file    Allergies:  Allergies  Allergen Reactions    Naltrexone Anaphylaxis   Clindamycin Other (See Comments)    Abdominal pain "twisted stomach" Abdominal pain "twisted stomach" Abdominal pain "twisted stomach" Abdominal pain "twisted stomach" Abdominal pain "twisted stomach"   Iodine Other (See Comments)   Erythromycin     Caused a "twisted stomach"   Penicillins Rash    Metabolic Disorder Labs: No results found for: "HGBA1C", "  MPG" No results found for: "PROLACTIN" No results found for: "CHOL", "TRIG", "HDL", "CHOLHDL", "VLDL", "LDLCALC" No results found for: "TSH"  Therapeutic Level Labs: No results found for: "LITHIUM" No results found for: "VALPROATE" No results found for: "CBMZ"  Current Medications: Current Outpatient Medications  Medication Sig Dispense Refill   Ascorbic Acid (VITAMIN C) 1000 MG tablet Take 1,000 mg by mouth in the morning and at bedtime.     aspirin EC 81 MG tablet Take 81 mg by mouth in the morning. Swallow whole.     benzonatate (TESSALON) 100 MG capsule Take 1 capsule (100 mg total) by mouth 3 (three) times daily as needed for up to 12 doses for cough. 12 capsule 0   BREZTRI AEROSPHERE 160-9-4.8 MCG/ACT AERO Inhale 1 puff into the lungs in the morning and at bedtime.     cyclobenzaprine (FLEXERIL) 5 MG tablet Take 1 tablet (5 mg total) by mouth 3 (three) times daily as needed for muscle spasms. 30 tablet 0   dexlansoprazole (DEXILANT) 60 MG capsule Take 60 mg by mouth every morning.      Dutasteride-Tamsulosin HCl 0.5-0.4 MG CAPS Take 1 capsule by mouth in the morning.     famotidine (PEPCID) 20 MG tablet Take 20 mg by mouth in the morning and at bedtime.     Homeopathic Products (MUSCLE CRAMP COMPLEX PO) Take 1 capsule by mouth in the morning and at bedtime. Cramp Defense Magnesium for Leg Cramps, Muscle Cramps & Muscle Spasms.     IBU 800 MG tablet Take 800 mg by mouth every 8 (eight) hours as needed (pain).     ipratropium (ATROVENT) 0.03 % nasal spray Place into both nostrils.     lamoTRIgine  (LAMICTAL) 200 MG tablet Take 1 tablet (200 mg total) by mouth at bedtime. 30 tablet 2   lisdexamfetamine (VYVANSE) 70 MG capsule Take 1 capsule (70 mg total) by mouth daily. 30 capsule 0   lisdexamfetamine (VYVANSE) 70 MG capsule Take 1 capsule (70 mg total) by mouth daily. 30 capsule 0   lisdexamfetamine (VYVANSE) 70 MG capsule Take 1 capsule (70 mg total) by mouth daily. 30 capsule 0   metoprolol succinate (TOPROL-XL) 50 MG 24 hr tablet Take 50 mg by mouth at bedtime.     Misc Natural Product Nasal (NASAL CLEANSE RINSE MIX) PACK Place 1 Dose into the nose daily. W/Azithromycin     mometasone (ELOCON) 0.1 % cream Apply 1 application topically daily as needed (skin irritation).     Multiple Vitamins-Minerals (MULTIVITAMIN WITH MINERALS) tablet Take 1 tablet by mouth daily. Multivitamin for Women (needs the iron)     PROAIR HFA 108 (90 Base) MCG/ACT inhaler Inhale 1-2 puffs into the lungs every 6 (six) hours as needed for shortness of breath or wheezing.     promethazine (PHENERGAN) 25 MG tablet Take 25 mg by mouth every 6 (six) hours as needed for nausea or vomiting.     tadalafil (CIALIS) 20 MG tablet Take 20 mg by mouth at bedtime as needed for erectile dysfunction.     telmisartan-hydrochlorothiazide (MICARDIS HCT) 80-12.5 MG tablet Take 1 tablet by mouth daily.     testosterone cypionate (DEPOTESTOTERONE CYPIONATE) 200 MG/ML injection Inject 200 mg into the muscle every 14 (fourteen) days.      VYVANSE 70 MG capsule TAKE (1) CAPSULE BY MOUTH ONCE DAILY. 30 capsule 0   No current facility-administered medications for this visit.     Musculoskeletal: Strength & Muscle Tone: na Gait & Station: na  Patient leans: N/A  Psychiatric Specialty Exam: Review of Systems  Musculoskeletal:  Positive for back pain.  All other systems reviewed and are negative.   There were no vitals taken for this visit.There is no height or weight on file to calculate BMI.  General Appearance: NA  Eye Contact:   NA  Speech:  Clear and Coherent  Volume:  Normal  Mood:  Euthymic  Affect:  NA  Thought Process:  Goal Directed  Orientation:  Full (Time, Place, and Person)  Thought Content: WDL   Suicidal Thoughts:  No  Homicidal Thoughts:  No  Memory:  Immediate;   Good Recent;   Good Remote;   NA  Judgement:  Good  Insight:  Fair  Psychomotor Activity:  Normal  Concentration:  Concentration: Good and Attention Span: Good  Recall:  Good  Fund of Knowledge: Good  Language: Good  Akathisia:  No  Handed:  Right  AIMS (if indicated): not done  Assets:  Communication Skills Desire for Improvement Resilience Social Support Talents/Skills  ADL's:  Intact  Cognition: WNL  Sleep:  Good   Screenings: PHQ2-9    Flowsheet Row Video Visit from 05/06/2022 in [place] [facility] [place] Video Visit from 02/04/2022 in [place] at [place] Video Visit from 08/14/2021 in [place] [facility] [place] Video Visit from 05/20/2021 in [place] [facility] Total Score 0 0 0 0      Flowsheet Row Video Visit from 05/06/2022 in [place] [facility] [place] Video Visit from 02/04/2022 in [place] at  [place] ED from 10/22/2021 in [facility] Emergency Department at [place] No Risk No Risk No Risk        Assessment and Plan: This patient is a 67 year old male with a history of bipolar disorder and ADHD.  He continues to do well on his current regimen.  He will continue Lamictal 200 mg daily for mood stabilization and Vyvanse 70 mg every morning for ADHD.  He will return to see me in 3 months  Collaboration of Care: Collaboration of Care: Primary Care Provider AEB notes are shared with PCP on the epic system  Patient/Guardian was advised Release of Information must be obtained prior to any record  release in order to collaborate their care with an outside provider. Patient/Guardian was advised if they have not already done so to contact the registration department to sign all necessary forms in order for [place] to release information regarding their care.   Consent: Patient/Guardian gives verbal consent for treatment and assignment of benefits for services provided during this visit. Patient/Guardian expressed understanding and agreed to proceed.    [name], MD 10/29/2022, 1:34 PM

## 2022-11-05 ENCOUNTER — Other Ambulatory Visit (HOSPITAL_COMMUNITY): Payer: Self-pay | Admitting: Psychiatry

## 2023-01-27 ENCOUNTER — Encounter (HOSPITAL_COMMUNITY): Payer: Self-pay | Admitting: Psychiatry

## 2023-01-27 ENCOUNTER — Telehealth (INDEPENDENT_AMBULATORY_CARE_PROVIDER_SITE_OTHER): Payer: PPO | Admitting: Psychiatry

## 2023-01-27 DIAGNOSIS — F3162 Bipolar disorder, current episode mixed, moderate: Secondary | ICD-10-CM

## 2023-01-27 DIAGNOSIS — F900 Attention-deficit hyperactivity disorder, predominantly inattentive type: Secondary | ICD-10-CM

## 2023-01-27 MED ORDER — LISDEXAMFETAMINE DIMESYLATE 70 MG PO CAPS: 70.0000 mg | ORAL_CAPSULE | Freq: Every day | ORAL | 0 refills | Status: DC

## 2023-01-27 MED ORDER — LAMOTRIGINE 200 MG PO TABS: 200.0000 mg | ORAL_TABLET | Freq: Every day | ORAL | 2 refills | Status: DC

## 2023-01-27 NOTE — Progress Notes (Signed)
Virtual Visit via Telephone Note  I connected with Ena Dawley on 01/27/23 at  9:40 AM EDT by telephone and verified that I am speaking with the correct person using two identifiers.  Location: Patient: home Provider: office   I discussed the limitations, risks, security and privacy concerns of performing an evaluation and management service by telephone and the availability of in person appointments. I also discussed with the patient that there may be a patient responsible charge related to this service. The patient expressed understanding and agreed to proceed.      I discussed the assessment and treatment plan with the patient. The patient was provided an opportunity to ask questions and all were answered. The patient agreed with the plan and demonstrated an understanding of the instructions.   The patient was advised to call back or seek an in-person evaluation if the symptoms worsen or if the condition fails to improve as anticipated.  I provided 15 minutes of non-face-to-face time during this encounter.   Diannia Ruder, MD  [facility] MD/PA/NP OP Progress Note  01/27/2023 10:06 AM Ena Dawley  MRN:  [id number]  Chief Complaint:  Chief Complaint  Patient presents with   Anxiety   ADHD   Follow-up   HPI: This patient is a 67 year old married white male lives with his wife in [place].  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  The patient returns for follow-up after 3 months regarding his ADHD and mood swings.  Overall he is doing well.  Right now he is having low back pain and will need to see his neurologist.  He tells me that his father passed away last month from lung cancer but he feels that he is in a better place and has felt that his life had a good resolution.  He denies mood swings irritability manic episodes or depression.  He is focusing well with the Vyvanse. Visit Diagnosis:    ICD-10-CM   1. Attention deficit  hyperactivity disorder (ADHD), predominantly inattentive type  F90.0     2. 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   Venous insufficiency     Past Surgical History:  Procedure Laterality Date   cerebral stent     COLONOSCOPY N/A 01/02/2017   Procedure: COLONOSCOPY;  Surgeon: Malissa Hippo, MD;  Location: [facility];  Service: Endoscopy;  Laterality: N/A;   dental implant  09/26/2014   upper and lower dental implant/bridges- permanent   ESOPHAGOGASTRODUODENOSCOPY N/A  01/02/2017   Procedure: ESOPHAGOGASTRODUODENOSCOPY (EGD);  Surgeon: Malissa Hippo, MD;  Location: [facility];  Service: Endoscopy;  Laterality: N/A;  910   EYE SURGERY Left Jan. 16, 2017   Cataract   IR ANGIO INTRA EXTRACRAN SEL INTERNAL CAROTID BILAT MOD SED  01/20/2017   IR ANGIO VERTEBRAL SEL VERTEBRAL UNI L MOD SED  01/20/2017   JOINT REPLACEMENT Right    Knee   JOINT REPLACEMENT Left    Hip   NOSE SURGERY     RADIOLOGY WITH ANESTHESIA N/A 12/29/2014   Procedure: Embolization;  Surgeon: Lisbeth Renshaw, MD;  Location: [facility] OR;  Service: Radiology;  Laterality: N/A;   SUBCLAVIAN ANGIOGRAM  Nov. 8, 2016   TOTAL HIP ARTHROPLASTY Left  01/03/2013   Procedure: TOTAL HIP ARTHROPLASTY ANTERIOR APPROACH;  Surgeon: Loanne Drilling, MD;  Location: [facility];  Service: Orthopedics;  Laterality: Left;   TOTAL KNEE ARTHROPLASTY Right 10/06/2014   Procedure: RIGHT TOTAL KNEE ARTHROPLASTY;  Surgeon: Loanne Drilling, MD;  Location: [facility];  Service: Orthopedics;  Laterality: Right;   WISDOM TOOTH EXTRACTION      Family Psychiatric History: See below  Family History:  Family History  Problem Relation Age of Onset   Arrhythmia Mother        Has pacemaker   Hypertension Mother    Heart disease Mother    Mental illness Mother    Varicose Veins Mother    Depression Mother    Colon cancer Father    Cancer Father        Prostate and Colon   Diabetes Father    Hypertension Father    Cancer - Colon Father    Depression Sister     Social History:  Social History   Socioeconomic History   Marital status: Married    Spouse name: Not on file   Number of children: Not on file   Years of education: Not on file   Highest education level: Not on file  Occupational History   Not on file  Tobacco Use   Smoking status: Former    Packs/day: .5    Types: Cigarettes    Start date: 05/23/2015    Quit date: 10/09/2017    Years since quitting: 5.3   Smokeless tobacco: Former    Types: Snuff   Tobacco comments:    Stated "I smoke off and on"  Vaping Use   Vaping Use: Never used  Substance and Sexual Activity   Alcohol use: No    Comment: last had fireball last night 09/10/21   Drug use: No   Sexual activity: Yes    Partners: Female  Other Topics Concern   Not on file  Social History Narrative   Not on file   Social Determinants of Health   Financial Resource Strain: Not on file  Food Insecurity: Not on file  Transportation Needs: Not on file  Physical Activity: Not on file  Stress: Not on file  Social Connections: Not on file    Allergies:  Allergies  Allergen Reactions   Naltrexone Anaphylaxis   Clindamycin Other (See  Comments)    Abdominal pain "twisted stomach" Abdominal pain "twisted stomach" Abdominal pain "twisted stomach" Abdominal pain "twisted stomach" Abdominal pain "twisted stomach"   Iodine Other (See Comments)   Erythromycin     Caused a "twisted stomach"   Penicillins Rash    Metabolic Disorder Labs: No results found for: "HGBA1C", "MPG" No results found for: "PROLACTIN" No results found for: "  CHOL", "TRIG", "HDL", "CHOLHDL", "VLDL", "LDLCALC" No results found for: "TSH"  Therapeutic Level Labs: No results found for: "LITHIUM" No results found for: "VALPROATE" No results found for: "CBMZ"  Current Medications: Current Outpatient Medications  Medication Sig Dispense Refill   Ascorbic Acid (VITAMIN C) 1000 MG tablet Take 1,000 mg by mouth in the morning and at bedtime.     aspirin EC 81 MG tablet Take 81 mg by mouth in the morning. Swallow whole.     BREZTRI AEROSPHERE 160-9-4.8 MCG/ACT AERO Inhale 1 puff into the lungs in the morning and at bedtime.     cyclobenzaprine (FLEXERIL) 5 MG tablet Take 1 tablet (5 mg total) by mouth 3 (three) times daily as needed for muscle spasms. 30 tablet 0   dexlansoprazole (DEXILANT) 60 MG capsule Take 60 mg by mouth every morning.      Dutasteride-Tamsulosin HCl 0.5-0.4 MG CAPS Take 1 capsule by mouth in the morning.     famotidine (PEPCID) 20 MG tablet Take 20 mg by mouth in the morning and at bedtime.     Homeopathic Products (MUSCLE CRAMP COMPLEX PO) Take 1 capsule by mouth in the morning and at bedtime. Cramp Defense Magnesium for Leg Cramps, Muscle Cramps & Muscle Spasms.     IBU 800 MG tablet Take 800 mg by mouth every 8 (eight) hours as needed (pain).     ipratropium (ATROVENT) 0.03 % nasal spray Place into both nostrils.     lamoTRIgine (LAMICTAL) 200 MG tablet Take 1 tablet (200 mg total) by mouth at bedtime. 30 tablet 2   lisdexamfetamine (VYVANSE) 70 MG capsule Take 1 capsule (70 mg total) by mouth daily. 30 capsule 0    lisdexamfetamine (VYVANSE) 70 MG capsule Take 1 capsule (70 mg total) by mouth daily. 30 capsule 0   lisdexamfetamine (VYVANSE) 70 MG capsule Take 1 capsule (70 mg total) by mouth daily. 30 capsule 0   metoprolol succinate (TOPROL-XL) 50 MG 24 hr tablet Take 50 mg by mouth at bedtime.     Misc Natural Product Nasal (NASAL CLEANSE RINSE MIX) PACK Place 1 Dose into the nose daily. W/Azithromycin     mometasone (ELOCON) 0.1 % cream Apply 1 application topically daily as needed (skin irritation).     Multiple Vitamins-Minerals (MULTIVITAMIN WITH MINERALS) tablet Take 1 tablet by mouth daily. Multivitamin for Women (needs the iron)     PROAIR HFA 108 (90 Base) MCG/ACT inhaler Inhale 1-2 puffs into the lungs every 6 (six) hours as needed for shortness of breath or wheezing.     promethazine (PHENERGAN) 25 MG tablet Take 25 mg by mouth every 6 (six) hours as needed for nausea or vomiting.     tadalafil (CIALIS) 20 MG tablet Take 20 mg by mouth at bedtime as needed for erectile dysfunction.     telmisartan-hydrochlorothiazide (MICARDIS HCT) 80-12.5 MG tablet Take 1 tablet by mouth daily.     testosterone cypionate (DEPOTESTOTERONE CYPIONATE) 200 MG/ML injection Inject 200 mg into the muscle every 14 (fourteen) days.      VYVANSE 70 MG capsule TAKE (1) CAPSULE BY MOUTH ONCE DAILY. 30 capsule 0   No current facility-administered medications for this visit.     Musculoskeletal: Strength & Muscle Tone: na Gait & Station: na Patient leans: N/A  Psychiatric Specialty Exam: Review of Systems  Musculoskeletal:  Positive for back pain.  All other systems reviewed and are negative.   There were no vitals taken for this visit.There is no height or weight on  file to calculate BMI.  General Appearance: NA  Eye Contact:  NA  Speech:  Clear and Coherent  Volume:  Normal  Mood:  Euthymic  Affect:  NA  Thought Process:  Goal Directed  Orientation:  Full (Time, Place, and Person)  Thought Content: WDL    Suicidal Thoughts:  No  Homicidal Thoughts:  No  Memory:  Immediate;   Good Recent;   Good Remote;   Good  Judgement:  Good  Insight:  Fair  Psychomotor Activity:  Normal  Concentration:  Concentration: Good and Attention Span: Good  Recall:  Good  Fund of Knowledge: Good  Language: Good  Akathisia:  No  Handed:  Right  AIMS (if indicated): not done  Assets:  Communication Skills Desire for Improvement Resilience Social Support Talents/Skills  ADL's:  Intact  Cognition: WNL  Sleep:  Good   Screenings: PHQ2-9    Flowsheet Row Video Visit from 05/06/2022 in [facility] [place] Video Visit from 02/04/2022 in [facility] at [place] Video Visit from 08/14/2021 in [facility] at [place] Video Visit from 05/20/2021 in [facility] at [facility] Total Score 0 0 0 0      Flowsheet Row Video Visit from 05/06/2022 in [facility] at [place] Video Visit from 02/04/2022 in [facility] [place] ED from 10/22/2021 in [facility] Department at [facility]  C-SSRS RISK CATEGORY No Risk No Risk No Risk        Assessment and Plan: This patient is a 67 year old male with a history of bipolar disorder and ADHD.  He is doing well on his current regimen.  He will continue Lamictal 200 mg daily for mood stabilization and Vyvanse 70 mg every morning for ADHD.  He will return to see me in 3 months  Collaboration of Care: Collaboration of Care: Primary Care Provider AEB notes are shared with PCP on the epic system  Patient/Guardian was advised Release of Information must be obtained prior to any record release in order to collaborate their care with an outside provider. Patient/Guardian was advised if they have not already done so to contact the registration department to sign all necessary  forms in order for [place] to release information regarding their care.   Consent: Patient/Guardian gives verbal consent for treatment and assignment of benefits for services provided during this visit. Patient/Guardian expressed understanding and agreed to proceed.    Diannia Ruder, MD 01/27/2023, 10:06 AM

## 2023-03-15 ENCOUNTER — Ambulatory Visit
Admission: RE | Admit: 2023-03-15 | Discharge: 2023-03-15 | Disposition: A | Discharge status: Home / Self Care | Payer: PPO | Source: Ambulatory Visit

## 2023-03-15 VITALS — BP 129/75 | Temp 97.8°F | Resp 13

## 2023-03-15 DIAGNOSIS — M5431 Sciatica, right side: Secondary | ICD-10-CM | POA: Diagnosis not present

## 2023-03-15 MED ORDER — PREDNISONE 50 MG PO TABS: ORAL_TABLET | ORAL | 0 refills | Status: DC

## 2023-03-15 MED ORDER — DEXAMETHASONE SODIUM PHOSPHATE 10 MG/ML IJ SOLN
10.0000 mg | INTRAMUSCULAR | Status: AC
  Administered 2023-03-15: 10 mg via INTRAMUSCULAR

## 2023-03-15 MED ORDER — KETOROLAC TROMETHAMINE 30 MG/ML IJ SOLN
30.0000 mg | Freq: Once | INTRAMUSCULAR | Status: AC
  Administered 2023-03-15: 30 mg via INTRAMUSCULAR

## 2023-03-15 NOTE — ED Provider Notes (Signed)
[facility]    CSN: [id number] Arrival date & time: 03/15/23  1050      History   Chief Complaint Chief Complaint  Patient presents with   Back Pain    Can't walk without assistance. Just came on suddenly. - Entered by patient    HPI LENNELL FLAUGHER is a 67 y.o. male.   The history is provided by the patient and the spouse.   The patient presents with his spouse for complaints of right-sided low back pain with pain that runs down the right leg with numbness and tingling.  Patient states symptoms started approximately 2 days ago.  Patient reports he does have an underlying history of chronic back pain, reports that he had back surgery in 2022.  He states since that time, he has been able to move around.  Patient currently sees physical therapy, reports that he feels the dry needling performed is most helpful for him.  Patient states that prior to his symptoms starting, he played golf, the next day, he began to experience symptoms.  Patient denies injury or trauma or loss of bowel or bladder function.  Patient states that it has been difficult for him to walk without assistance.  He states that he does take hydrocodone as needed for his chronic back pain.  Patient also has an orthopedist that he sees for his back.  He denies prior history of sciatica.  Patient states he has seen vascular surgery in the past for varicose veins.  Patient is ambulating using a walker, which he does not normally use.  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   Venous insufficiency     Patient Active Problem List   Diagnosis Date Noted   Pseudophakia, both eyes 06/09/2022   Vitreous membranes and strands, right eye 01/02/2022   History of vitrectomy 01/02/2022   Myofascial pain 10/22/2021   Sensorineural hearing loss (SNHL), bilateral 10/10/2021   Tinnitus of both ears 10/10/2021   Alcohol abuse 09/11/2021   Transaminitis 09/11/2021   Obesity (BMI 30.0-34.9) 09/11/2021   COPD (chronic obstructive pulmonary disease) (HCC) 09/11/2021   Chronic back pain 09/11/2021   Altered mental status 09/11/2021   Inflammation of sacroiliac joint (HCC) 09/10/2021   Opiate dependence (HCC) 09/10/2021   Overweight (BMI 25.0-29.9) 08/16/2021   Sacroiliitis (HCC) 07/17/2021   Body mass index (BMI) 30.0-30.9, adult 05/29/2021   Lumbar spondylosis 02/25/2021   Mixed hyperlipidemia 02/15/2021   Asthma 02/13/2021   Elevated blood-pressure reading, without diagnosis of hypertension 12/17/2020   Body mass index (BMI) 31.0-31.9, adult 12/17/2020   Spinal stenosis, 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   Tachycardia 11/16/2020   Gastroesophageal reflux disease without esophagitis  10/15/2020   Bipolar affective disorder in remission (HCC) 10/15/2020   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   Seasonal allergies 10/15/2020   Chronic pain syndrome 10/15/2020   Mixed simple and mucopurulent chronic bronchitis (HCC) 10/15/2020   Male erectile disorder 10/15/2020   History of cerebral aneurysm repair 10/15/2020   Degenerative disc disease, cervical 10/15/2020   Benign prostatic hyperplasia without lower urinary tract symptoms 10/15/2020   Essential hypertension 06/27/2020   Body mass index (BMI) 29.0-29.9, adult 05/22/2020   Pain in joint of left shoulder 09/12/2019    Pain in joint of left elbow 09/12/2019   Pain in thumb joint with movement of left hand 09/12/2019   Osteoarthritis of carpometacarpal [facility]) joint of thumb 09/12/2019   Spinal stenosis of lumbar region without neurogenic claudication 01/19/2019   Prolapsed lumbar disc 01/19/2019   Stenosis of intervertebral foramina 10/04/2018   Cervical radiculitis 10/04/2018   Acute back pain with sciatica 07/19/2018   Lumbago with sciatica, left side 07/19/2018   Family hx of colon cancer 11/13/2016   Absolute anemia 11/13/2016   Varicose veins of right lower extremity with complications 12/10/2015   Bipolar 1 disorder, mixed, moderate (HCC) 10/12/2015   Attention deficit hyperactivity disorder (ADHD), combined type 10/12/2015   Neck pain 09/13/2015   Chronic low back pain 09/13/2015   Chronic venous insufficiency 06/25/2015   SBO (small bowel obstruction) (HCC) 01/16/2015   OA (osteoarthritis) of knee 10/06/2014   Intracranial aneurysm 09/25/2014   Facial cellulitis 05/31/2014   OA (osteoarthritis) of hip 01/03/2013    Past Surgical History:  Procedure Laterality Date   cerebral stent     COLONOSCOPY N/A 01/02/2017   Procedure: COLONOSCOPY;  Surgeon: Malissa Hippo, MD;  Location: [facility];  Service: Endoscopy;  Laterality: N/A;   dental implant  09/26/2014   upper and lower dental implant/bridges- permanent   ESOPHAGOGASTRODUODENOSCOPY N/A 01/02/2017   Procedure: ESOPHAGOGASTRODUODENOSCOPY (EGD);  Surgeon: Malissa Hippo, MD;  Location: [facility];  Service: Endoscopy;  Laterality: N/A;  910   EYE SURGERY Left Jan. 16, 2017   Cataract   IR ANGIO INTRA EXTRACRAN SEL INTERNAL CAROTID BILAT MOD SED  01/20/2017   IR ANGIO VERTEBRAL SEL VERTEBRAL UNI L MOD SED  01/20/2017   JOINT REPLACEMENT Right    Knee   JOINT REPLACEMENT Left    Hip   NOSE SURGERY     RADIOLOGY WITH ANESTHESIA N/A 12/29/2014   Procedure: Embolization;  Surgeon: Lisbeth Renshaw, MD;  Location: [facility];  Service:  Radiology;  Laterality: N/A;   SUBCLAVIAN ANGIOGRAM  Nov. 8, 2016   TOTAL HIP ARTHROPLASTY Left 01/03/2013   Procedure: TOTAL HIP ARTHROPLASTY ANTERIOR APPROACH;  Surgeon: Loanne Drilling, MD;  Location: [facility];  Service: Orthopedics;  Laterality: Left;   TOTAL KNEE ARTHROPLASTY Right 10/06/2014   Procedure: RIGHT TOTAL KNEE ARTHROPLASTY;  Surgeon: Loanne Drilling, MD;  Location: [facility];  Service: Orthopedics;  Laterality: Right;   WISDOM TOOTH EXTRACTION         Home Medications    Prior to Admission medications   Medication Sig Start Date End Date Taking? Authorizing Provider  telmisartan (MICARDIS) 80 MG tablet Take 80 mg by mouth daily. 03/06/23  Yes [provider]  Ascorbic Acid (VITAMIN C) 1000 MG tablet Take 1,000 mg by mouth in the morning and at bedtime.    [provider]  aspirin EC 81 MG tablet Take 81 mg by mouth in the morning. Swallow whole.    [provider]  BREZTRI AEROSPHERE 160-9-4.8 MCG/ACT AERO Inhale 1 puff into the lungs in the morning and at bedtime. 11/16/20   [provider]  cyclobenzaprine (FLEXERIL) 5 MG tablet Take 1 tablet (5 mg total) by mouth 3 (three) times daily as needed for muscle spasms. 12/08/20   Meyran, Tiana Loft, NP  dexlansoprazole (DEXILANT) 60 MG capsule Take 60 mg by mouth every morning.     [provider]  Dutasteride-Tamsulosin HCl 0.5-0.4 MG CAPS Take 1 capsule by mouth in the morning.    [provider]  famotidine (PEPCID) 20 MG tablet Take 20 mg by mouth in the morning and at bedtime. 11/06/20   [provider]  Homeopathic Products (MUSCLE CRAMP COMPLEX PO) Take 1 capsule by mouth in the morning and at bedtime. Cramp Defense Magnesium for Leg Cramps, Muscle Cramps & Muscle Spasms.    [provider]  IBU 800 MG tablet Take 800 mg by mouth every 8 (eight) hours as needed (pain). 11/16/20   [provider]  ipratropium (ATROVENT) 0.03 % nasal spray Place into  both nostrils. 07/24/21   [provider]  lamoTRIgine (LAMICTAL) 200 MG tablet Take 1 tablet (200 mg total) by mouth at bedtime. 01/27/23   Myrlene Broker, MD  lisdexamfetamine (VYVANSE) 70 MG capsule Take 1 capsule (70 mg total) by mouth daily. 01/27/23   Myrlene Broker, MD  lisdexamfetamine (VYVANSE) 70 MG capsule Take 1 capsule (70 mg total) by mouth daily. 01/27/23   Myrlene Broker, MD  lisdexamfetamine (VYVANSE) 70 MG capsule Take 1 capsule (70 mg total) by mouth daily. 01/27/23   Myrlene Broker, MD  metoprolol succinate (TOPROL-XL) 50 MG 24 hr tablet Take 50 mg by mouth at bedtime. 11/16/20   [provider]  Misc Natural Product Nasal (NASAL CLEANSE RINSE MIX) PACK Place 1 Dose into the nose daily. W/Azithromycin    [provider]  mometasone (ELOCON) 0.1 % cream Apply 1 application topically daily as needed (skin irritation). 10/15/20   [provider]  Multiple Vitamins-Minerals (MULTIVITAMIN WITH MINERALS) tablet Take 1 tablet by mouth daily. Multivitamin for Women (needs the iron)    [provider]  PROAIR HFA 108 (90 Base) MCG/ACT inhaler Inhale 1-2 puffs into the lungs every 6 (six) hours as needed for shortness of breath or wheezing. 10/21/17   [provider]  promethazine (PHENERGAN) 25 MG tablet Take 25 mg by mouth every 6 (six) hours as needed for nausea or vomiting.    [provider]  tadalafil (CIALIS) 20 MG tablet Take 20 mg by mouth at bedtime as needed for erectile dysfunction.    [provider]  telmisartan-hydrochlorothiazide (MICARDIS HCT) 80-12.5 MG tablet Take 1 tablet by mouth daily. 08/23/21   [provider]  testosterone cypionate (DEPOTESTOTERONE CYPIONATE) 200 MG/ML injection Inject 200 mg into the muscle every 14 (fourteen) days.  12/16/14   [provider]  VYVANSE 70 MG capsule TAKE (1) CAPSULE BY MOUTH ONCE DAILY. 10/14/22   Myrlene Broker, MD    Family History Family  History  Problem Relation Age of Onset   Arrhythmia Mother        Has pacemaker   Hypertension Mother    Heart disease Mother    Mental illness Mother    Varicose Veins Mother    Depression Mother    Colon cancer Father    Cancer Father        Prostate and Colon   Diabetes  Father    Hypertension Father    Cancer - Colon Father    Depression Sister     Social History Social History   Tobacco Use   Smoking status: Former    Packs/day: .5    Types: Cigarettes    Start date: 05/23/2015    Quit date: 10/09/2017    Years since quitting: 5.4   Smokeless tobacco: Former    Types: Snuff   Tobacco comments:    Stated "I smoke off and on"  Vaping Use   Vaping Use: Never used  Substance Use Topics   Alcohol use: No    Comment: last had fireball last night 09/10/21   Drug use: No     Allergies   Naltrexone, Clindamycin, Erythromycin, and Penicillins   Review of Systems Review of Systems Per HPI  Physical Exam Triage Vital Signs ED Triage Vitals  Enc Vitals Group     BP 03/15/23 1102 129/75     Pulse --      Resp 03/15/23 1102 13     Temp 03/15/23 1102 97.8 F (36.6 C)     Temp Source 03/15/23 1102 Oral     SpO2 03/15/23 1102 93 %     Weight --      Height --      Head Circumference --      Peak Flow --      Pain Score 03/15/23 1104 7     Pain Loc --      Pain Edu? --      Excl. in GC? --    No data found.  Updated Vital Signs BP 129/75 (BP Location: Right Arm)   Temp 97.8 F (36.6 C) (Oral)   Resp 13   SpO2 93%   Visual Acuity Right Eye Distance:   Left Eye Distance:   Bilateral Distance:    Right Eye Near:   Left Eye Near:    Bilateral Near:     Physical Exam Vitals and nursing note reviewed.  Constitutional:      General: He is not in acute distress.    Appearance: Normal appearance.  Eyes:     Extraocular Movements: Extraocular movements intact.     Pupils: Pupils are equal, round, and reactive to light.  Cardiovascular:     Rate and  Rhythm: Regular rhythm.     Pulses: Normal pulses.     Heart sounds: Normal heart sounds.  Pulmonary:     Effort: Pulmonary effort is normal.     Breath sounds: Normal breath sounds.  Abdominal:     General: Bowel sounds are normal.     Palpations: Abdomen is soft.  Musculoskeletal:     Cervical back: Normal range of motion.     Lumbar back: Tenderness present. No swelling, edema, deformity or signs of trauma. Decreased range of motion. Positive right straight leg raise test.     Right hip: Normal range of motion.     Right lower leg: Normal.     Left lower leg: Normal.     Comments: Tenderness noted along the sciatic nerve in the right buttock. Numerous varicosities noted in the bilateral lower extremities.  Skin:    General: Skin is warm and dry.  Neurological:     General: No focal deficit present.     Mental Status: He is alert and oriented to person, place, and time.  Psychiatric:        Mood and Affect: Mood normal.  Behavior: Behavior normal.      UC Treatments / Results  Labs (all labs ordered are listed, but only abnormal results are displayed) Labs Reviewed - No data to display  EKG   Radiology No results found.  Procedures Procedures (including critical care time)  Medications Ordered in UC Medications  dexamethasone (DECADRON) injection 10 mg (has no administration in time range)  ketorolac (TORADOL) 30 MG/ML injection 30 mg (has no administration in time range)    Initial Impression / Assessment and Plan / UC Course  I have reviewed the triage vital signs and the nursing notes.  Pertinent labs & imaging results that were available during my care of the patient were reviewed by me and considered in my medical decision making (see chart for details).  The patient appears uncomfortable due to his symptoms.  Vital signs are stable, and he is in no acute distress however.  Symptoms appear to be consistent with sciatica.  Decadron 10 mg IM and  Toradol 30 mg IM were administered.  Prednisone 50 mg for the next 5 days was prescribed to help with sciatic nerve inflammation.  Supportive care recommendations were provided and discussed with the patient to include following up with his physical therapist, trying to remain as active as possible, using ice or heat as needed, and use of over-the-counter Tylenol for breakthrough pain or discomfort.  Patient was given strict ER follow-up precautions.  Patient advised to follow-up with vascular surgery for continued lower extremity complaints regarding varicosities, and with his orthopedist for ongoing back pain.  Patient was in agreement with this plan of care.  All questions were answered.  Patient stable for discharge.   Final Clinical Impressions(s) / UC Diagnoses   Final diagnoses:  None   Discharge Instructions   None    ED Prescriptions   None    PDMP not reviewed this encounter.   Abran Cantor, NP 03/15/23 1141

## 2023-03-15 NOTE — Discharge Instructions (Signed)
Your symptoms appear to be consistent with sciatica. You were given Decadron 10 mg and Toradol 30 mg today. Take medication as prescribed.  As discussed, do not take ibuprofen while you are taking the prednisone.  Recommend taking Tylenol arthritis strength 650 mg tablets as needed.  You can pick these tablets up over-the-counter. Perform stretching exercises provided once you have reviewed them with your physical therapist. Try to remain as active as possible. May apply ice or heat as needed.  Apply ice for pain or swelling, heat for spasm or stiffness.  Apply for 20 minutes, remove for 1 hour, then repeat as needed. Please follow-up with vascular surgery for continued lower extremity complaints regarding the varicose veins.  Also recommend that you follow-up with your orthopedist for your back. Go to the emergency department immediately if you experience loss of bowel or bladder function, worsening pain, become unable to ambulate, or other concerns. Follow-up as needed.

## 2023-03-15 NOTE — ED Triage Notes (Addendum)
Pt c/o back pain sudden onset, down into his but swelling in the leg, but no leg pain. Cannot walk without assistance. Hx of back surgery

## 2023-03-25 ENCOUNTER — Other Ambulatory Visit (HOSPITAL_COMMUNITY): Payer: Self-pay | Admitting: Psychiatry

## 2023-04-20 ENCOUNTER — Other Ambulatory Visit (HOSPITAL_COMMUNITY): Payer: Self-pay | Admitting: Neurosurgery

## 2023-04-20 DIAGNOSIS — M961 Postlaminectomy syndrome, not elsewhere classified: Secondary | ICD-10-CM

## 2023-04-24 ENCOUNTER — Other Ambulatory Visit (HOSPITAL_COMMUNITY): Payer: Self-pay | Admitting: Psychiatry

## 2023-04-25 ENCOUNTER — Ambulatory Visit (HOSPITAL_COMMUNITY)
Admission: RE | Admit: 2023-04-25 | Discharge: 2023-04-25 | Disposition: A | Discharge status: Home / Self Care | Payer: PPO | Source: Ambulatory Visit | Attending: Neurosurgery | Admitting: Neurosurgery

## 2023-04-25 DIAGNOSIS — M961 Postlaminectomy syndrome, not elsewhere classified: Secondary | ICD-10-CM | POA: Insufficient documentation

## 2023-04-29 ENCOUNTER — Telehealth (INDEPENDENT_AMBULATORY_CARE_PROVIDER_SITE_OTHER): Payer: PPO | Admitting: Psychiatry

## 2023-04-29 ENCOUNTER — Encounter (HOSPITAL_COMMUNITY): Payer: Self-pay | Admitting: Psychiatry

## 2023-04-29 DIAGNOSIS — F909 Attention-deficit hyperactivity disorder, unspecified type: Secondary | ICD-10-CM | POA: Diagnosis not present

## 2023-04-29 DIAGNOSIS — F3162 Bipolar disorder, current episode mixed, moderate: Secondary | ICD-10-CM

## 2023-04-29 DIAGNOSIS — F900 Attention-deficit hyperactivity disorder, predominantly inattentive type: Secondary | ICD-10-CM

## 2023-04-29 MED ORDER — LISDEXAMFETAMINE DIMESYLATE 70 MG PO CAPS: 70.0000 mg | ORAL_CAPSULE | Freq: Every day | ORAL | 0 refills | Status: DC

## 2023-04-29 MED ORDER — LISDEXAMFETAMINE DIMESYLATE 70 MG PO CAPS: 70.0000 mg | ORAL_CAPSULE | Freq: Every morning | ORAL | 0 refills | Status: DC

## 2023-04-29 MED ORDER — LAMOTRIGINE 200 MG PO TABS: 200.0000 mg | ORAL_TABLET | Freq: Every day | ORAL | 0 refills | Status: DC

## 2023-04-29 NOTE — Progress Notes (Signed)
Virtual Visit via Telephone Note  I connected with Philip Gates on 04/29/23 at  9:40 AM EDT by telephone and verified that I am speaking with the correct person using two identifiers.  Location: Patient: home Provider: office   I discussed the limitations, risks, security and privacy concerns of performing an evaluation and management service by telephone and the availability of in person appointments. I also discussed with the patient that there may be a patient responsible charge related to this service. The patient expressed understanding and agreed to proceed.     I discussed the assessment and treatment plan with the patient. The patient was provided an opportunity to ask questions and all were answered. The patient agreed with the plan and demonstrated an understanding of the instructions.   The patient was advised to call back or seek an in-person evaluation if the symptoms worsen or if the condition fails to improve as anticipated.  I provided 15 minutes of non-face-to-face time during this encounter.   Diannia Ruder, MD  [facility] MD/PA/NP OP Progress Note  04/29/2023 10:00 AM Philip Gates  MRN:  [id number]  Chief Complaint:  Chief Complaint  Patient presents with   ADHD   Manic Behavior   Follow-up   HPI: This patient is a 67 year old married white male who lives with his wife in [place].  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  The patient returns for follow-up after 3 months regarding his ADHD and mood swings.  Overall he is continues to do well.  He is still suffering from low back pain.  He played a lot of golf over the July 4 weekend and then ended up with severe back pain and almost not being able to walk.  He has been going to his neurosurgeon and getting dry needling through PT.  He recently got an MRI and the result is pending.  Nevertheless he states his mood is good and he denies significant irritability  manic episodes or depression.  He is focusing well with the Vyvanse Visit Diagnosis:    ICD-10-CM   1. Attention deficit hyperactivity disorder (ADHD), predominantly inattentive type  F90.0     2. 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   Venous insufficiency     Past Surgical History:  Procedure Laterality Date   cerebral stent     COLONOSCOPY N/A 01/02/2017   Procedure: COLONOSCOPY;  Surgeon: Malissa Hippo, MD;  [facility]: [facility];  Service: Endoscopy;  Laterality: N/A;   dental implant  09/26/2014   upper and lower dental implant/bridges- permanent   ESOPHAGOGASTRODUODENOSCOPY N/A 01/02/2017   Procedure: ESOPHAGOGASTRODUODENOSCOPY (EGD);  Surgeon: Malissa Hippo, MD;  [facility]: [facility];  Service: Endoscopy;  Laterality: N/A;  910   EYE SURGERY Left Jan. 16, 2017   Cataract   IR ANGIO INTRA EXTRACRAN SEL INTERNAL CAROTID BILAT MOD SED  01/20/2017   IR ANGIO VERTEBRAL SEL VERTEBRAL UNI L MOD SED  01/20/2017   JOINT REPLACEMENT Right    Knee   JOINT REPLACEMENT Left    Hip   NOSE SURGERY     RADIOLOGY WITH ANESTHESIA N/A 12/29/2014   Procedure: Embolization;  Surgeon: Lisbeth Renshaw, MD;   Location: [facility] OR;  Service: Radiology;  Laterality: N/A;   SUBCLAVIAN ANGIOGRAM  Nov. 8, 2016   TOTAL HIP ARTHROPLASTY Left 01/03/2013   Procedure: TOTAL HIP ARTHROPLASTY ANTERIOR APPROACH;  Surgeon: Loanne Drilling, MD;  Location: [facility]S;  Service: Orthopedics;  Laterality: Left;   TOTAL KNEE ARTHROPLASTY Right 10/06/2014   Procedure: RIGHT TOTAL KNEE ARTHROPLASTY;  Surgeon: Loanne Drilling, MD;  Location: [facility];  Service: Orthopedics;  Laterality: Right;   WISDOM TOOTH EXTRACTION      Family Psychiatric History: See below  Family History:  Family History  Problem Relation Age of Onset   Arrhythmia Mother        Has pacemaker   Hypertension Mother    Heart disease Mother    Mental illness Mother    Varicose Veins Mother    Depression Mother    Colon cancer Father    Cancer Father        Prostate and Colon   Diabetes Father    Hypertension Father    Cancer - Colon Father    Depression Sister     Social History:  Social History   Socioeconomic History   Marital status: Married    Spouse name: Not on file   Number of children: Not on file   Years of education: Not on file   Highest education level: Not on file  Occupational History   Not on file  Tobacco Use   Smoking status: Former    Current packs/day: 0.00    Average packs/day: 0.5 packs/day for 2.4 years (1.2 ttl pk-yrs)    Types: Cigarettes    Start date: 05/23/2015    Quit date: 10/09/2017    Years since quitting: 5.5   Smokeless tobacco: Former    Types: Snuff   Tobacco comments:    Stated "I smoke off and on"  Vaping Use   Vaping status: Never Used  Substance and Sexual Activity   Alcohol use: No    Comment: last had fireball last night 09/10/21   Drug use: No   Sexual activity: Yes    Partners: Female  Other Topics Concern   Not on file  Social History Narrative   Not on file   Social Determinants of Health   Financial Resource Strain: High Risk (03/06/2023)   [facility]   Overall Financial Resource Strain (CARDIA)    Difficulty of Paying Living Expenses: Very hard  Food Insecurity: No Food Insecurity (03/06/2023)   [facility], [facility]   Hunger Vital Sign    Worried About Running Out of Food in the Last Year: Never true    Ran Out of Food in the Last Year: Never true  Transportation Needs: No Transportation Needs (  03/06/2023)   Received from [facility], [facility]   PRAPARE - Transportation    Lack of Transportation (Medical): No    Lack of Transportation (Non-Medical): No  Physical Activity: Insufficiently Active (03/06/2023)   [facility], [facility]   Exercise Vital Sign    Days of Exercise per Week: 2 days    Minutes of Exercise per Session: 20 min  Stress: No Stress Concern Present (03/06/2023)   Received from [facility] - Occupational Stress Questionnaire    Feeling of Stress : Not at all  Social Connections: Moderately Integrated (03/06/2023)   Received from [facility], [facility]   Social Network    How would you rate your social network (family, work, friends)?: Adequate participation with social networks    Allergies:  Allergies  Allergen Reactions   Naltrexone Anaphylaxis   Clindamycin Other (See Comments)    Abdominal pain "twisted stomach" Abdominal pain "twisted stomach" Abdominal pain "twisted stomach" Abdominal pain "twisted stomach" Abdominal pain "twisted stomach"   Erythromycin     Caused a "twisted stomach"   Penicillins Rash    Metabolic Disorder Labs: No results found for: "HGBA1C", "MPG" No results found for: "PROLACTIN" No results found for: "CHOL", "TRIG", "HDL", "CHOLHDL", "VLDL", "LDLCALC" No results found for: "TSH"  Therapeutic Level Labs: No results found for: "LITHIUM" No results found for: "VALPROATE" No results found for: "CBMZ"  Current Medications: Current Outpatient Medications   Medication Sig Dispense Refill   Ascorbic Acid (VITAMIN C) 1000 MG tablet Take 1,000 mg by mouth in the morning and at bedtime.     aspirin EC 81 MG tablet Take 81 mg by mouth in the morning. Swallow whole.     BREZTRI AEROSPHERE 160-9-4.8 MCG/ACT AERO Inhale 1 puff into the lungs in the morning and at bedtime.     cyclobenzaprine (FLEXERIL) 5 MG tablet Take 1 tablet (5 mg total) by mouth 3 (three) times daily as needed for muscle spasms. 30 tablet 0   dexlansoprazole (DEXILANT) 60 MG capsule Take 60 mg by mouth every morning.      Dutasteride-Tamsulosin HCl 0.5-0.4 MG CAPS Take 1 capsule by mouth in the morning.     famotidine (PEPCID) 20 MG tablet Take 20 mg by mouth in the morning and at bedtime.     Homeopathic Products (MUSCLE CRAMP COMPLEX PO) Take 1 capsule by mouth in the morning and at bedtime. Cramp Defense Magnesium for Leg Cramps, Muscle Cramps & Muscle Spasms.     IBU 800 MG tablet Take 800 mg by mouth every 8 (eight) hours as needed (pain).     ipratropium (ATROVENT) 0.03 % nasal spray Place into both nostrils.     lamoTRIgine (LAMICTAL) 200 MG tablet Take 1 tablet (200 mg total) by mouth at bedtime. 30 tablet 0   lisdexamfetamine (VYVANSE) 70 MG capsule Take 1 capsule (70 mg total) by mouth every morning. 30 capsule 0   lisdexamfetamine (VYVANSE) 70 MG capsule Take 1 capsule (70 mg total) by mouth daily. 30 capsule 0   lisdexamfetamine (VYVANSE) 70 MG capsule Take 1 capsule (70 mg total) by mouth daily. 30 capsule 0   metoprolol succinate (TOPROL-XL) 50 MG 24 hr tablet Take 50 mg by mouth at bedtime.     Misc Natural Product Nasal (NASAL CLEANSE RINSE MIX) PACK Place 1 Dose into the nose daily. W/Azithromycin     mometasone (ELOCON) 0.1 % cream Apply 1 application topically daily  as needed (skin irritation).     Multiple Vitamins-Minerals (MULTIVITAMIN WITH MINERALS) tablet Take 1 tablet by mouth daily. Multivitamin for Women (needs the iron)     predniSONE (DELTASONE) 50 MG  tablet Take 1 tablet daily with breakfast for the next 5 days. 5 tablet 0   PROAIR HFA 108 (90 Base) MCG/ACT inhaler Inhale 1-2 puffs into the lungs every 6 (six) hours as needed for shortness of breath or wheezing.     promethazine (PHENERGAN) 25 MG tablet Take 25 mg by mouth every 6 (six) hours as needed for nausea or vomiting.     tadalafil (CIALIS) 20 MG tablet Take 20 mg by mouth at bedtime as needed for erectile dysfunction.     telmisartan (MICARDIS) 80 MG tablet Take 80 mg by mouth daily.     telmisartan-hydrochlorothiazide (MICARDIS HCT) 80-12.5 MG tablet Take 1 tablet by mouth daily.     testosterone cypionate (DEPOTESTOTERONE CYPIONATE) 200 MG/ML injection Inject 200 mg into the muscle every 14 (fourteen) days.      No current facility-administered medications for this visit.     Musculoskeletal: Strength & Muscle Tone: na Gait & Station: na Patient leans: N/A  Psychiatric Specialty Exam: Review of Systems  Musculoskeletal:  Positive for back pain.  All other systems reviewed and are negative.   There were no vitals taken for this visit.There is no height or weight on file to calculate BMI.  General Appearance: NA  Eye Contact:  NA  Speech:  Clear and Coherent  Volume:  Normal  Mood:  Euthymic  Affect:  Congruent  Thought Process:  Goal Directed  Orientation:  Full (Time, Place, and Person)  Thought Content: WDL   Suicidal Thoughts:  No  Homicidal Thoughts:  No  Memory:  Immediate;   Good Recent;   Good Remote;   Good  Judgement:  Good  Insight:  Good  Psychomotor Activity:  Decreased  Concentration:  Concentration: Good and Attention Span: Good  Recall:  Good  Fund of Knowledge: Good  Language: Good  Akathisia:  No  Handed:  Right  AIMS (if indicated): not done  Assets:  Communication Skills Desire for Improvement Physical Health Resilience Social Support Talents/Skills  ADL's:  Intact  Cognition: WNL  Sleep:  Good   Screenings: PHQ2-9     Flowsheet Row Video Visit from 05/06/2022 in [facility] at [place] Video Visit from 02/04/2022 in [facility] at [place] Video Visit from 08/14/2021 in [facility] at [place] Video Visit from 05/20/2021 in [facility] at [facility] Total Score 0 0 0 0      Flowsheet Row ED from 03/15/2023 in [facility] [place] Video Visit from 05/06/2022 in [facility] at [place] Video Visit from 02/04/2022 in [facility] at [place]  C-SSRS RISK CATEGORY No Risk No Risk No Risk        Assessment and Plan: This patient is a 67 year old male with a history of bipolar disorder and ADHD.  He continues to do well on his current regimen.  He will continue Lamictal 200 mg daily for mood stabilization and Vyvanse 70 mg every morning for ADHD.  He will return to see me in 3 months  Collaboration of Care: Collaboration of Care: Primary Care Provider AEB notes are shared with PCP on the epic system  Patient/Guardian was advised Release of Information must be obtained prior  to any record release in order to collaborate their care with an outside provider. Patient/Guardian was advised if they have not already done so to contact the registration department to sign all necessary forms in order for [place] to release information regarding their care.   Consent: Patient/Guardian gives verbal consent for treatment and assignment of benefits for services provided during this visit. Patient/Guardian expressed understanding and agreed to proceed.    Diannia Ruder, MD 04/29/2023, 10:00 AM

## 2023-05-29 ENCOUNTER — Other Ambulatory Visit (HOSPITAL_COMMUNITY): Payer: Self-pay | Admitting: Student

## 2023-05-29 DIAGNOSIS — M25551 Pain in right hip: Secondary | ICD-10-CM

## 2023-06-02 ENCOUNTER — Ambulatory Visit (HOSPITAL_COMMUNITY)
Admission: RE | Admit: 2023-06-02 | Discharge: 2023-06-02 | Disposition: A | Discharge status: Home / Self Care | Payer: PPO | Source: Ambulatory Visit | Attending: Student | Admitting: Student

## 2023-06-02 DIAGNOSIS — M25551 Pain in right hip: Secondary | ICD-10-CM | POA: Insufficient documentation

## 2023-06-10 ENCOUNTER — Encounter (INDEPENDENT_AMBULATORY_CARE_PROVIDER_SITE_OTHER): Payer: PPO | Admitting: Ophthalmology

## 2023-06-26 ENCOUNTER — Other Ambulatory Visit: Payer: Self-pay | Admitting: Neurosurgery

## 2023-07-13 ENCOUNTER — Other Ambulatory Visit: Payer: Self-pay | Admitting: Neurosurgery

## 2023-07-16 NOTE — Pre-Procedure Instructions (Signed)
Surgical Instructions   Your procedure is scheduled on Friday, November 15th. Report to [facility] Main Entrance "A" at 05:30 A.M., then check in with the Admitting office. Any questions or running late day of surgery: call [phone]  Questions prior to your surgery date: call [phone], Monday-Friday, 8am-4pm. If you experience any cold or flu symptoms such as cough, fever, chills, shortness of breath, etc. between now and your scheduled surgery, please notify us at the above number.     Remember:  Do not eat or drink after midnight the night before your surgery    Take these medicines the morning of surgery with A SIP OF WATER  dexlansoprazole (DEXILANT)  famotidine (PEPCID)  lamoTRIgine (LAMICTAL)     May take these medicines IF NEEDED: chlorpheniramine (CHLOR-TRIMETON)  HYDROcodone-acetaminophen (NORCO)  ipratropium (ATROVENT) nasal spray methocarbamol (ROBAXIN)  olopatadine (PATADAY) eye drops Povidone, PF, (IVIZIA DRY EYES) eye drops PROAIR HFA- bring inhaler with you on day of surgery promethazine (PHENERGAN)    Follow your surgeon's instructions on when to stop Aspirin.  If no instructions were given by your surgeon then you will need to call the office to get those instructions.    One week prior to surgery, STOP taking any Aleve, Naproxen, Ibuprofen, Motrin, Advil, Goody's, BC's, all herbal medications, fish oil, and non-prescription vitamins.                     Do NOT Smoke (Tobacco/Vaping) for 24 hours prior to your procedure.  If you use a CPAP at night, you may bring your mask/headgear for your overnight stay.   You will be asked to remove any contacts, glasses, piercing's, hearing aid's, dentures/partials prior to surgery. Please bring cases for these items if needed.    Patients discharged the day of surgery will not be allowed to drive home, and someone needs to stay with them for 24 hours.  SURGICAL WAITING ROOM VISITATION Patients may have no  more than 2 support people in the waiting area - these visitors may rotate.   Pre-op nurse will coordinate an appropriate time for 1 ADULT support person, who may not rotate, to accompany patient in pre-op.  Children under the age of 49 must have an adult with them who is not the patient and must remain in the main waiting area with an adult.  If the patient needs to stay at the hospital during part of their recovery, the visitor guidelines for inpatient rooms apply.  Please refer to the [facility] website for the visitor guidelines for any additional information.   If you received a COVID test during your pre-op visit  it is requested that you wear a mask when out in public, stay away from anyone that may not be feeling well and notify your surgeon if you develop symptoms. If you have been in contact with anyone that has tested positive in the last 10 days please notify you surgeon.      Pre-operative 5 CHG Bathing Instructions   You can play a key role in reducing the risk of infection after surgery. Your skin needs to be as free of germs as possible. You can reduce the number of germs on your skin by washing with CHG (chlorhexidine gluconate) soap before surgery. CHG is an antiseptic soap that kills germs and continues to kill germs even after washing.   DO NOT use if you have an allergy to chlorhexidine/CHG or antibacterial soaps. If your skin becomes reddened or irritated, stop  using the CHG and notify one of our RNs at [phone].   Please shower with the CHG soap starting 4 days before surgery using the following schedule:     Please keep in mind the following:  DO NOT shave, including legs and underarms, starting the day of your first shower.   You may shave your face at any point before/day of surgery.  Place clean sheets on your bed the day you start using CHG soap. Use a clean washcloth (not used since being washed) for each shower. DO NOT sleep with pets once you start  using the CHG.   CHG Shower Instructions:  Wash your face and private area with normal soap. If you choose to wash your hair, wash first with your normal shampoo.  After you use shampoo/soap, rinse your hair and body thoroughly to remove shampoo/soap residue.  Turn the water OFF and apply about 3 tablespoons (45 ml) of CHG soap to a CLEAN washcloth.  Apply CHG soap ONLY FROM YOUR NECK DOWN TO YOUR TOES (washing for 3-5 minutes)  DO NOT use CHG soap on face, private areas, open wounds, or sores.  Pay special attention to the area where your surgery is being performed.  If you are having back surgery, having someone wash your back for you may be helpful. Wait 2 minutes after CHG soap is applied, then you may rinse off the CHG soap.  Pat dry with a clean towel  Put on clean clothes/pajamas   If you choose to wear lotion, please use ONLY the CHG-compatible lotions on the back of this paper.   Additional instructions for the day of surgery: DO NOT APPLY any lotions, deodorants, cologne, or perfumes.   Do not bring valuables to the hospital. [facility] is not responsible for any belongings/valuables. Do not wear nail polish, gel polish, artificial nails, or any other type of covering on natural nails (fingers and toes) Do not wear jewelry or makeup Put on clean/comfortable clothes.  Please brush your teeth.  Ask your nurse before applying any prescription medications to the skin.     CHG Compatible Lotions   Aveeno Moisturizing lotion  Cetaphil Moisturizing Cream  Cetaphil Moisturizing Lotion  Clairol Herbal Essence Moisturizing Lotion, Dry Skin  Clairol Herbal Essence Moisturizing Lotion, Extra Dry Skin  Clairol Herbal Essence Moisturizing Lotion, Normal Skin  Curel Age Defying Therapeutic Moisturizing Lotion with Alpha Hydroxy  Curel Extreme Care Body Lotion  Curel Soothing Hands Moisturizing Hand Lotion  Curel Therapeutic Moisturizing Cream, Fragrance-Free  Curel Therapeutic  Moisturizing Lotion, Fragrance-Free  Curel Therapeutic Moisturizing Lotion, Original Formula  Eucerin Daily Replenishing Lotion  Eucerin Dry Skin Therapy Plus Alpha Hydroxy Crme  Eucerin Dry Skin Therapy Plus Alpha Hydroxy Lotion  Eucerin Original Crme  Eucerin Original Lotion  Eucerin Plus Crme Eucerin Plus Lotion  Eucerin TriLipid Replenishing Lotion  Keri Anti-Bacterial Hand Lotion  Keri Deep Conditioning Original Lotion Dry Skin Formula Softly Scented  Keri Deep Conditioning Original Lotion, Fragrance Free Sensitive Skin Formula  Keri Lotion Fast Absorbing Fragrance Free Sensitive Skin Formula  Keri Lotion Fast Absorbing Softly Scented Dry Skin Formula  Keri Original Lotion  Keri Skin Renewal Lotion Keri Silky Smooth Lotion  Keri Silky Smooth Sensitive Skin Lotion  Nivea Body Creamy Conditioning Oil  Nivea Body Extra Enriched Teacher, adult education Moisturizing Lotion Nivea Crme  Nivea Skin Firming Lotion  NutraDerm 30 Skin Lotion  NutraDerm Skin Lotion  NutraDerm Therapeutic Skin Cream  NutraDerm Therapeutic Skin Lotion  ProShield Protective Hand Cream  Provon moisturizing lotion  Please read over the following fact sheets that you were given.

## 2023-07-17 ENCOUNTER — Other Ambulatory Visit: Payer: Self-pay

## 2023-07-17 ENCOUNTER — Encounter (HOSPITAL_COMMUNITY)
Admission: RE | Admit: 2023-07-17 | Discharge: 2023-07-17 | Disposition: A | Discharge status: Home / Self Care | Payer: PPO | Source: Ambulatory Visit | Attending: Neurosurgery | Admitting: Neurosurgery

## 2023-07-17 ENCOUNTER — Encounter (HOSPITAL_COMMUNITY): Payer: Self-pay

## 2023-07-17 VITALS — BP 145/89 | HR 103 | Temp 98.6°F | Resp 17 | Ht 71.0 in | Wt 200.6 lb

## 2023-07-17 DIAGNOSIS — Z01818 Encounter for other preprocedural examination: Secondary | ICD-10-CM | POA: Insufficient documentation

## 2023-07-17 DIAGNOSIS — Z789 Other specified health status: Secondary | ICD-10-CM | POA: Diagnosis not present

## 2023-07-17 DIAGNOSIS — M25512 Pain in left shoulder: Secondary | ICD-10-CM

## 2023-07-17 DIAGNOSIS — I10 Essential (primary) hypertension: Secondary | ICD-10-CM | POA: Diagnosis not present

## 2023-07-17 DIAGNOSIS — Z01810 Encounter for preprocedural cardiovascular examination: Secondary | ICD-10-CM | POA: Diagnosis present

## 2023-07-17 DIAGNOSIS — Z01812 Encounter for preprocedural laboratory examination: Secondary | ICD-10-CM | POA: Diagnosis present

## 2023-07-17 LAB — COMPREHENSIVE METABOLIC PANEL
ALT: 28 U/L (ref 0–44)
AST: 21 U/L (ref 15–41)
Albumin: 3.8 g/dL (ref 3.5–5.0)
Alkaline Phosphatase: 72 U/L (ref 38–126)
Anion gap: 10 (ref 5–15)
BUN: 8 mg/dL (ref 8–23)
CO2: 25 mmol/L (ref 22–32)
Calcium: 9.5 mg/dL (ref 8.9–10.3)
Chloride: 105 mmol/L (ref 98–111)
Creatinine, Ser: 1.12 mg/dL (ref 0.61–1.24)
GFR, Estimated: >60 mL/min (ref >60)
Glucose, Bld: 95 mg/dL (ref 70–99)
Potassium: 3.9 mmol/L (ref 3.5–5.1)
Sodium: 140 mmol/L (ref 135–145)
Total Bilirubin: 0.5 mg/dL (ref <1.2)
Total Protein: 6.5 g/dL (ref 6.5–8.1)

## 2023-07-17 LAB — TYPE AND SCREEN
ABO/RH(D): AB POS
Antibody Screen: NEGATIVE

## 2023-07-17 LAB — CBC
HCT: 43 % (ref 39.0–52.0)
Hemoglobin: 14.8 g/dL (ref 13.0–17.0)
MCH: 32 pg (ref 26.0–34.0)
MCHC: 34.4 g/dL (ref 30.0–36.0)
MCV: 92.9 fL (ref 80.0–100.0)
Platelets: 290 10*3/uL (ref 150–400)
RBC: 4.63 MIL/uL (ref 4.22–5.81)
RDW: 13.2 % (ref 11.5–15.5)
WBC: 8.3 10*3/uL (ref 4.0–10.5)
nRBC: 0 % (ref 0.0–0.2)

## 2023-07-17 LAB — SURGICAL PCR SCREEN
MRSA, PCR: NEGATIVE
Staphylococcus aureus: NEGATIVE

## 2023-07-17 NOTE — Progress Notes (Signed)
PCP - [name], NP Cardiologist - denies Pulmonologist-denies  PPM/ICD - denies Device Orders - n/a Rep Notified - n/a  Chest x-ray - denies EKG - 07/17/2023 Stress Test - denies ECHO - denies Cardiac Cath - denies  Sleep Study - denies CPAP - n/a  Fasting Blood Sugar - no DM Checks Blood Sugar _____ times a day  Last dose of GLP1 agonist-  n/a GLP1 instructions: n/a  Blood Thinner Instructions: n/a Aspirin Instructions: follow your surgeon's instructions on when to stop aspirin. Per pt he was told to stop 7 days prior to sx.   ERAS Protcol -no; NPO PRE-SURGERY Ensure or G2- no  COVID TEST- n/a   Anesthesia review: yes; HR 103; BP 145/89. Hx of chest pain about a year ago; denies any since then.  Patient denies shortness of breath, fever, cough and chest pain at PAT appointment   All instructions explained to the patient, with a verbal understanding of the material. Patient agrees to go over the instructions while at home for a better understanding. Patient also instructed to self quarantine after being tested for COVID-19. The opportunity to ask questions was provided.

## 2023-07-24 ENCOUNTER — Other Ambulatory Visit: Payer: Self-pay

## 2023-07-24 ENCOUNTER — Observation Stay (HOSPITAL_COMMUNITY)
Admission: RE | Admit: 2023-07-24 | Discharge: 2023-07-25 | Disposition: A | Discharge status: Home / Self Care | Payer: PPO | Attending: Neurosurgery | Admitting: Neurosurgery

## 2023-07-24 ENCOUNTER — Ambulatory Visit (HOSPITAL_COMMUNITY): Payer: PPO

## 2023-07-24 ENCOUNTER — Ambulatory Visit (HOSPITAL_COMMUNITY): Payer: PPO | Admitting: Physician Assistant

## 2023-07-24 ENCOUNTER — Ambulatory Visit (HOSPITAL_BASED_OUTPATIENT_CLINIC_OR_DEPARTMENT_OTHER): Payer: PPO | Admitting: Anesthesiology

## 2023-07-24 ENCOUNTER — Ambulatory Visit (HOSPITAL_COMMUNITY)
Admission: RE | Disposition: A | Discharge status: Home / Self Care | Payer: Self-pay | Source: Home / Self Care | Attending: Neurosurgery

## 2023-07-24 ENCOUNTER — Encounter (HOSPITAL_COMMUNITY): Payer: Self-pay | Admitting: Neurosurgery

## 2023-07-24 DIAGNOSIS — Z7982 Long term (current) use of aspirin: Secondary | ICD-10-CM | POA: Insufficient documentation

## 2023-07-24 DIAGNOSIS — Z79899 Other long term (current) drug therapy: Secondary | ICD-10-CM | POA: Diagnosis not present

## 2023-07-24 DIAGNOSIS — Z96642 Presence of left artificial hip joint: Secondary | ICD-10-CM | POA: Insufficient documentation

## 2023-07-24 DIAGNOSIS — M4727 Other spondylosis with radiculopathy, lumbosacral region: Principal | ICD-10-CM | POA: Insufficient documentation

## 2023-07-24 DIAGNOSIS — Z96651 Presence of right artificial knee joint: Secondary | ICD-10-CM | POA: Diagnosis not present

## 2023-07-24 DIAGNOSIS — J45909 Unspecified asthma, uncomplicated: Secondary | ICD-10-CM | POA: Diagnosis not present

## 2023-07-24 DIAGNOSIS — M4726 Other spondylosis with radiculopathy, lumbar region: Principal | ICD-10-CM | POA: Diagnosis present

## 2023-07-24 DIAGNOSIS — J449 Chronic obstructive pulmonary disease, unspecified: Secondary | ICD-10-CM | POA: Insufficient documentation

## 2023-07-24 DIAGNOSIS — Z87891 Personal history of nicotine dependence: Secondary | ICD-10-CM | POA: Insufficient documentation

## 2023-07-24 HISTORY — PX: TRANSFORAMINAL LUMBAR INTERBODY FUSION (TLIF) WITH PEDICLE SCREW FIXATION 1 LEVEL: SHX6141

## 2023-07-24 SURGERY — TRANSFORAMINAL LUMBAR INTERBODY FUSION (TLIF) WITH PEDICLE SCREW FIXATION 1 LEVEL
Anesthesia: General | Laterality: Right

## 2023-07-24 MED ORDER — HYDROMORPHONE HCL 1 MG/ML IJ SOLN
0.2500 mg | INTRAMUSCULAR | Status: DC | PRN
Start: 2023-07-24 — End: 2023-07-24
  Administered 2023-07-24 (×2): 0.5 mg via INTRAVENOUS

## 2023-07-24 MED ORDER — THROMBIN 5000 UNITS EX SOLR
CUTANEOUS | Status: AC
  Filled 2023-07-24: qty 5000

## 2023-07-24 MED ORDER — LAMOTRIGINE 100 MG PO TABS
200.0000 mg | ORAL_TABLET | Freq: Every day | ORAL | Status: DC
  Administered 2023-07-24: 200 mg via ORAL
  Filled 2023-07-24: qty 2

## 2023-07-24 MED ORDER — BISACODYL 10 MG RE SUPP: 10.0000 mg | Freq: Every day | RECTAL | Status: DC | PRN

## 2023-07-24 MED ORDER — MEPERIDINE HCL 25 MG/ML IJ SOLN
6.2500 mg | INTRAMUSCULAR | Status: DC | PRN
Start: 2023-07-24 — End: 2023-07-24

## 2023-07-24 MED ORDER — LIDOCAINE 2% (20 MG/ML) 5 ML SYRINGE
INTRAMUSCULAR | Status: AC
  Filled 2023-07-24: qty 5

## 2023-07-24 MED ORDER — THROMBIN 5000 UNITS EX SOLR
OROMUCOSAL | Status: DC | PRN
  Administered 2023-07-24: 5 mL via TOPICAL

## 2023-07-24 MED ORDER — SUGAMMADEX SODIUM 200 MG/2ML IV SOLN
INTRAVENOUS | Status: DC | PRN
  Administered 2023-07-24: 200 mg via INTRAVENOUS

## 2023-07-24 MED ORDER — VANCOMYCIN HCL IN DEXTROSE 1-5 GM/200ML-% IV SOLN
1000.0000 mg | Freq: Once | INTRAVENOUS | Status: AC
  Administered 2023-07-24: 1000 mg via INTRAVENOUS
  Filled 2023-07-24: qty 200

## 2023-07-24 MED ORDER — MENTHOL 3 MG MT LOZG
1.0000 | LOZENGE | OROMUCOSAL | Status: DC | PRN
  Filled 2023-07-24: qty 9

## 2023-07-24 MED ORDER — PHENYLEPHRINE HCL-NACL 20-0.9 MG/250ML-% IV SOLN
INTRAVENOUS | Status: DC | PRN
  Administered 2023-07-24: 50 ug/min via INTRAVENOUS

## 2023-07-24 MED ORDER — LISDEXAMFETAMINE DIMESYLATE 70 MG PO CAPS
70.0000 mg | ORAL_CAPSULE | Freq: Every day | ORAL | Status: DC
  Administered 2023-07-25: 70 mg via ORAL
  Filled 2023-07-24: qty 1

## 2023-07-24 MED ORDER — SENNA 8.6 MG PO TABS
1.0000 | ORAL_TABLET | Freq: Two times a day (BID) | ORAL | Status: DC
  Administered 2023-07-24 – 2023-07-25 (×3): 8.6 mg via ORAL
  Filled 2023-07-24 (×3): qty 1

## 2023-07-24 MED ORDER — ACETAMINOPHEN 10 MG/ML IV SOLN
INTRAVENOUS | Status: AC
  Filled 2023-07-24: qty 100

## 2023-07-24 MED ORDER — FENTANYL CITRATE (PF) 250 MCG/5ML IJ SOLN
INTRAMUSCULAR | Status: AC
  Filled 2023-07-24: qty 5

## 2023-07-24 MED ORDER — METHOCARBAMOL 750 MG PO TABS
750.0000 mg | ORAL_TABLET | Freq: Three times a day (TID) | ORAL | Status: DC | PRN
  Administered 2023-07-24 – 2023-07-25 (×3): 750 mg via ORAL
  Filled 2023-07-24 (×3): qty 1

## 2023-07-24 MED ORDER — BUPIVACAINE HCL (PF) 0.5 % IJ SOLN
INTRAMUSCULAR | Status: AC
  Filled 2023-07-24: qty 30

## 2023-07-24 MED ORDER — ACETAMINOPHEN 325 MG PO TABS: 325.0000 mg | ORAL_TABLET | Freq: Once | ORAL | Status: DC | PRN

## 2023-07-24 MED ORDER — THROMBIN 20000 UNITS EX KIT
PACK | CUTANEOUS | Status: DC | PRN
  Administered 2023-07-24: 20 mL via TOPICAL

## 2023-07-24 MED ORDER — ALBUTEROL SULFATE (2.5 MG/3ML) 0.083% IN NEBU: 3.0000 mL | INHALATION_SOLUTION | Freq: Four times a day (QID) | RESPIRATORY_TRACT | Status: DC | PRN

## 2023-07-24 MED ORDER — IRBESARTAN 300 MG PO TABS
300.0000 mg | ORAL_TABLET | Freq: Every day | ORAL | Status: DC
  Administered 2023-07-25: 300 mg via ORAL
  Filled 2023-07-24: qty 1

## 2023-07-24 MED ORDER — ONDANSETRON HCL 4 MG/2ML IJ SOLN
INTRAMUSCULAR | Status: AC
  Filled 2023-07-24: qty 2

## 2023-07-24 MED ORDER — IPRATROPIUM BROMIDE 0.03 % NA SOLN: 1.0000 | Freq: Two times a day (BID) | NASAL | Status: DC | PRN

## 2023-07-24 MED ORDER — CHLORHEXIDINE GLUCONATE 0.12 % MT SOLN
15.0000 mL | Freq: Once | OROMUCOSAL | Status: AC
Start: 2023-07-24 — End: 2023-07-24
  Administered 2023-07-24: 15 mL via OROMUCOSAL
  Filled 2023-07-24: qty 15

## 2023-07-24 MED ORDER — SODIUM CHLORIDE 0.9% FLUSH
3.0000 mL | Freq: Two times a day (BID) | INTRAVENOUS | Status: DC
  Administered 2023-07-25: 3 mL via INTRAVENOUS

## 2023-07-24 MED ORDER — FAMOTIDINE 20 MG PO TABS
20.0000 mg | ORAL_TABLET | Freq: Two times a day (BID) | ORAL | Status: DC
  Administered 2023-07-24 – 2023-07-25 (×2): 20 mg via ORAL
  Filled 2023-07-24 (×2): qty 1

## 2023-07-24 MED ORDER — LACTATED RINGERS IV SOLN: INTRAVENOUS | Status: DC

## 2023-07-24 MED ORDER — BUPIVACAINE HCL (PF) 0.5 % IJ SOLN
INTRAMUSCULAR | Status: DC | PRN
  Administered 2023-07-24: 5 mL

## 2023-07-24 MED ORDER — HYDROMORPHONE HCL 1 MG/ML IJ SOLN
INTRAMUSCULAR | Status: AC
  Filled 2023-07-24: qty 1

## 2023-07-24 MED ORDER — THROMBIN 20000 UNITS EX SOLR: CUTANEOUS | Status: DC | PRN

## 2023-07-24 MED ORDER — MONTELUKAST SODIUM 10 MG PO TABS: 10.0000 mg | ORAL_TABLET | Freq: Every day | ORAL | Status: DC

## 2023-07-24 MED ORDER — METOPROLOL SUCCINATE ER 50 MG PO TB24
100.0000 mg | ORAL_TABLET | Freq: Every day | ORAL | Status: DC
  Administered 2023-07-24: 100 mg via ORAL
  Filled 2023-07-24: qty 2

## 2023-07-24 MED ORDER — ACETAMINOPHEN 10 MG/ML IV SOLN
1000.0000 mg | Freq: Once | INTRAVENOUS | Status: DC | PRN
Start: 2023-07-24 — End: 2023-07-24
  Administered 2023-07-24: 1000 mg via INTRAVENOUS

## 2023-07-24 MED ORDER — PHENYLEPHRINE HCL (PRESSORS) 10 MG/ML IV SOLN
INTRAVENOUS | Status: DC | PRN
  Administered 2023-07-24: 160 ug via INTRAVENOUS

## 2023-07-24 MED ORDER — LACTATED RINGERS IV SOLN
INTRAVENOUS | Status: DC
Start: 2023-07-24 — End: 2023-07-24

## 2023-07-24 MED ORDER — VANCOMYCIN HCL IN DEXTROSE 1-5 GM/200ML-% IV SOLN
1000.0000 mg | INTRAVENOUS | Status: AC
  Administered 2023-07-24: 1000 mg via INTRAVENOUS
  Filled 2023-07-24: qty 200

## 2023-07-24 MED ORDER — CHLORHEXIDINE GLUCONATE CLOTH 2 % EX PADS: 6.0000 | MEDICATED_PAD | Freq: Once | CUTANEOUS | Status: DC

## 2023-07-24 MED ORDER — PANTOPRAZOLE SODIUM 40 MG PO TBEC
40.0000 mg | DELAYED_RELEASE_TABLET | Freq: Every day | ORAL | Status: DC
  Administered 2023-07-25: 40 mg via ORAL
  Filled 2023-07-24: qty 1

## 2023-07-24 MED ORDER — ACETAMINOPHEN 500 MG PO TABS
1000.0000 mg | ORAL_TABLET | Freq: Once | ORAL | Status: AC
  Administered 2023-07-24: 1000 mg via ORAL
  Filled 2023-07-24: qty 2

## 2023-07-24 MED ORDER — PROPOFOL 10 MG/ML IV BOLUS
INTRAVENOUS | Status: AC
  Filled 2023-07-24: qty 20

## 2023-07-24 MED ORDER — ACETAMINOPHEN 650 MG RE SUPP: 650.0000 mg | RECTAL | Status: DC | PRN

## 2023-07-24 MED ORDER — SUCCINYLCHOLINE CHLORIDE 200 MG/10ML IV SOSY
PREFILLED_SYRINGE | INTRAVENOUS | Status: DC | PRN
  Administered 2023-07-24: 140 mg via INTRAVENOUS

## 2023-07-24 MED ORDER — LIDOCAINE-EPINEPHRINE 1 %-1:100000 IJ SOLN
INTRAMUSCULAR | Status: DC | PRN
  Administered 2023-07-24: 5 mL via INTRADERMAL

## 2023-07-24 MED ORDER — MIDAZOLAM HCL 2 MG/2ML IJ SOLN
INTRAMUSCULAR | Status: AC
  Filled 2023-07-24: qty 2

## 2023-07-24 MED ORDER — ROCURONIUM BROMIDE 10 MG/ML (PF) SYRINGE
PREFILLED_SYRINGE | INTRAVENOUS | Status: AC
  Filled 2023-07-24: qty 10

## 2023-07-24 MED ORDER — ADULT MULTIVITAMIN W/MINERALS CH
1.0000 | ORAL_TABLET | Freq: Every day | ORAL | Status: DC
  Administered 2023-07-24 – 2023-07-25 (×2): 1 via ORAL
  Filled 2023-07-24 (×2): qty 1

## 2023-07-24 MED ORDER — PHENOL 1.4 % MT LIQD: 1.0000 | OROMUCOSAL | Status: DC | PRN

## 2023-07-24 MED ORDER — PROMETHAZINE HCL 25 MG PO TABS: 25.0000 mg | ORAL_TABLET | Freq: Four times a day (QID) | ORAL | Status: DC | PRN

## 2023-07-24 MED ORDER — OXYCODONE HCL 5 MG PO TABS
10.0000 mg | ORAL_TABLET | ORAL | Status: DC | PRN
  Administered 2023-07-24 – 2023-07-25 (×3): 10 mg via ORAL
  Filled 2023-07-24 (×3): qty 2

## 2023-07-24 MED ORDER — THROMBIN 20000 UNITS EX SOLR
CUTANEOUS | Status: AC
  Filled 2023-07-24: qty 20000

## 2023-07-24 MED ORDER — ACETAMINOPHEN 160 MG/5ML PO SOLN: 325.0000 mg | Freq: Once | ORAL | Status: DC | PRN

## 2023-07-24 MED ORDER — OLOPATADINE HCL 0.1 % OP SOLN: 1.0000 [drp] | Freq: Two times a day (BID) | OPHTHALMIC | Status: DC | PRN

## 2023-07-24 MED ORDER — ONDANSETRON HCL 4 MG/2ML IJ SOLN
INTRAMUSCULAR | Status: DC | PRN
  Administered 2023-07-24: 4 mg via INTRAVENOUS

## 2023-07-24 MED ORDER — OXYCODONE HCL 5 MG PO TABS
5.0000 mg | ORAL_TABLET | ORAL | Status: DC | PRN
  Administered 2023-07-24: 5 mg via ORAL
  Filled 2023-07-24: qty 1

## 2023-07-24 MED ORDER — HYDROMORPHONE HCL 1 MG/ML IJ SOLN
1.0000 mg | INTRAMUSCULAR | Status: DC | PRN
  Administered 2023-07-24 – 2023-07-25 (×6): 1 mg via INTRAVENOUS
  Filled 2023-07-24 (×6): qty 1

## 2023-07-24 MED ORDER — PHENYLEPHRINE 80 MCG/ML (10ML) SYRINGE FOR IV PUSH (FOR BLOOD PRESSURE SUPPORT)
PREFILLED_SYRINGE | INTRAVENOUS | Status: AC
  Filled 2023-07-24: qty 10

## 2023-07-24 MED ORDER — DROPERIDOL 2.5 MG/ML IJ SOLN: 0.6250 mg | Freq: Once | INTRAMUSCULAR | Status: DC | PRN

## 2023-07-24 MED ORDER — SODIUM CHLORIDE 0.9% FLUSH: 3.0000 mL | INTRAVENOUS | Status: DC | PRN

## 2023-07-24 MED ORDER — DEXAMETHASONE SODIUM PHOSPHATE 10 MG/ML IJ SOLN
INTRAMUSCULAR | Status: AC
  Filled 2023-07-24: qty 1

## 2023-07-24 MED ORDER — ACETAMINOPHEN 325 MG PO TABS: 650.0000 mg | ORAL_TABLET | ORAL | Status: DC | PRN

## 2023-07-24 MED ORDER — LIDOCAINE 2% (20 MG/ML) 5 ML SYRINGE
INTRAMUSCULAR | Status: DC | PRN
  Administered 2023-07-24: 60 mg via INTRAVENOUS

## 2023-07-24 MED ORDER — ROCURONIUM BROMIDE 10 MG/ML (PF) SYRINGE
PREFILLED_SYRINGE | INTRAVENOUS | Status: DC | PRN
  Administered 2023-07-24: 30 mg via INTRAVENOUS
  Administered 2023-07-24: 50 mg via INTRAVENOUS

## 2023-07-24 MED ORDER — DEXAMETHASONE SODIUM PHOSPHATE 10 MG/ML IJ SOLN
INTRAMUSCULAR | Status: DC | PRN
  Administered 2023-07-24: 8 mg via INTRAVENOUS

## 2023-07-24 MED ORDER — ORAL CARE MOUTH RINSE: 15.0000 mL | Freq: Once | OROMUCOSAL | Status: AC

## 2023-07-24 MED ORDER — DOCUSATE SODIUM 100 MG PO CAPS
100.0000 mg | ORAL_CAPSULE | Freq: Two times a day (BID) | ORAL | Status: DC
  Administered 2023-07-24 – 2023-07-25 (×3): 100 mg via ORAL
  Filled 2023-07-24 (×3): qty 1

## 2023-07-24 MED ORDER — LIDOCAINE-EPINEPHRINE 1 %-1:100000 IJ SOLN
INTRAMUSCULAR | Status: AC
  Filled 2023-07-24: qty 1

## 2023-07-24 MED ORDER — FENTANYL CITRATE (PF) 250 MCG/5ML IJ SOLN
INTRAMUSCULAR | Status: DC | PRN
  Administered 2023-07-24: 50 ug via INTRAVENOUS
  Administered 2023-07-24: 25 ug via INTRAVENOUS
  Administered 2023-07-24: 100 ug via INTRAVENOUS
  Administered 2023-07-24: 25 ug via INTRAVENOUS
  Administered 2023-07-24: 50 ug via INTRAVENOUS

## 2023-07-24 MED ORDER — PROPOFOL 10 MG/ML IV BOLUS
INTRAVENOUS | Status: DC | PRN
  Administered 2023-07-24: 150 mg via INTRAVENOUS

## 2023-07-24 MED ORDER — MIDAZOLAM HCL 2 MG/2ML IJ SOLN
INTRAMUSCULAR | Status: DC | PRN
  Administered 2023-07-24: 2 mg via INTRAVENOUS

## 2023-07-24 MED ORDER — SODIUM CHLORIDE 0.9 % IV SOLN
250.0000 mL | INTRAVENOUS | Status: AC
  Administered 2023-07-24 (×2): 250 mL via INTRAVENOUS

## 2023-07-24 SURGICAL SUPPLY — 70 items
BAG COUNTER SPONGE SURGICOUNT (BAG) ×1 IMPLANT
BENZOIN TINCTURE PRP APPL 2/3 (GAUZE/BANDAGES/DRESSINGS) IMPLANT
BLADE BONE MILL MEDIUM (MISCELLANEOUS) IMPLANT
BLADE CLIPPER SURG (BLADE) IMPLANT
BLADE SURG 11 STRL SS (BLADE) ×1 IMPLANT
BUR MATCHSTICK NEURO 3.0 LAGG (BURR) ×1 IMPLANT
BUR PRECISION FLUTE 5.0 (BURR) ×1 IMPLANT
CAGE INTERBODY PL LG 7X26.5X24 (Cage) IMPLANT
CANISTER SUCT 3000ML PPV (MISCELLANEOUS) ×1 IMPLANT
CNTNR URN SCR LID CUP LEK RST (MISCELLANEOUS) ×1 IMPLANT
CONT SPEC 4OZ STRL OR WHT (MISCELLANEOUS) ×1
COVER BACK TABLE 60X90IN (DRAPES) ×1 IMPLANT
DERMABOND ADVANCED .7 DNX12 (GAUZE/BANDAGES/DRESSINGS) ×1 IMPLANT
DRAPE C-ARM 42X72 X-RAY (DRAPES) ×1 IMPLANT
DRAPE C-ARMOR (DRAPES) ×1 IMPLANT
DRAPE LAPAROTOMY 100X72X124 (DRAPES) ×1 IMPLANT
DRAPE SURG 17X23 STRL (DRAPES) ×1 IMPLANT
DRSG OPSITE POSTOP 4X6 (GAUZE/BANDAGES/DRESSINGS) IMPLANT
DURAPREP 26ML APPLICATOR (WOUND CARE) ×1 IMPLANT
ELECT REM PT RETURN 9FT ADLT (ELECTROSURGICAL) ×1
ELECTRODE REM PT RTRN 9FT ADLT (ELECTROSURGICAL) ×1 IMPLANT
GAUZE 4X4 16PLY ~~LOC~~+RFID DBL (SPONGE) IMPLANT
GAUZE SPONGE 4X4 12PLY STRL (GAUZE/BANDAGES/DRESSINGS) IMPLANT
GLOVE BIO SURGEON STRL SZ7 (GLOVE) IMPLANT
GLOVE BIOGEL PI IND STRL 7.0 (GLOVE) IMPLANT
GLOVE BIOGEL PI IND STRL 7.5 (GLOVE) ×1 IMPLANT
GLOVE ECLIPSE 7.0 STRL STRAW (GLOVE) ×1 IMPLANT
GLOVE EXAM NITRILE XL STR (GLOVE) IMPLANT
GOWN STRL REUS W/ TWL LRG LVL3 (GOWN DISPOSABLE) ×2 IMPLANT
GOWN STRL REUS W/ TWL XL LVL3 (GOWN DISPOSABLE) IMPLANT
GOWN STRL REUS W/TWL 2XL LVL3 (GOWN DISPOSABLE) IMPLANT
GOWN STRL REUS W/TWL LRG LVL3 (GOWN DISPOSABLE) ×2
GOWN STRL REUS W/TWL XL LVL3 (GOWN DISPOSABLE)
GRAFT BONE PROTEIOS XS 0.5CC (Orthopedic Implant) IMPLANT
HEMOSTAT POWDER KIT SURGIFOAM (HEMOSTASIS) ×1 IMPLANT
KIT BASIN OR (CUSTOM PROCEDURE TRAY) ×1 IMPLANT
KIT POSITION SURG JACKSON T1 (MISCELLANEOUS) ×1 IMPLANT
KIT TURNOVER KIT B (KITS) ×1 IMPLANT
NDL HYPO 18GX1.5 BLUNT FILL (NEEDLE) IMPLANT
NDL HYPO 25X1 1.5 SAFETY (NEEDLE) ×1 IMPLANT
NDL SPNL 18GX3.5 QUINCKE PK (NEEDLE) IMPLANT
NEEDLE HYPO 18GX1.5 BLUNT FILL (NEEDLE) IMPLANT
NEEDLE HYPO 25X1 1.5 SAFETY (NEEDLE) ×1 IMPLANT
NEEDLE SPNL 18GX3.5 QUINCKE PK (NEEDLE) ×1 IMPLANT
NS IRRIG 1000ML POUR BTL (IV SOLUTION) ×1 IMPLANT
PACK LAMINECTOMY NEURO (CUSTOM PROCEDURE TRAY) ×1 IMPLANT
PAD ARMBOARD 7.5X6 YLW CONV (MISCELLANEOUS) ×3 IMPLANT
PUTTY GRAFTON DBF 6CC W/DELIVE (Putty) IMPLANT
ROD 40MM (Rod) ×1 IMPLANT
ROD COBALT 47.5X35 (Rod) IMPLANT
ROD SPNL CVD 40X4.75X (Rod) IMPLANT
SCREW SET SOLERA (Screw) ×4 IMPLANT
SCREW SET SOLERA TI (Screw) IMPLANT
SCREW SOLERA 40X6.5XMA NS SPNE (Screw) IMPLANT
SCREW SOLERA 6.5X35 (Screw) IMPLANT
SCREW SOLERA 6.5X40MM (Screw) ×1 IMPLANT
SPIKE FLUID TRANSFER (MISCELLANEOUS) ×1 IMPLANT
SPONGE SURGIFOAM ABS GEL 100 (HEMOSTASIS) ×1 IMPLANT
SPONGE T-LAP 4X18 ~~LOC~~+RFID (SPONGE) IMPLANT
STRIP CLOSURE SKIN 1/2X4 (GAUZE/BANDAGES/DRESSINGS) IMPLANT
SUT VIC AB 0 CT1 18XCR BRD8 (SUTURE) ×1 IMPLANT
SUT VIC AB 0 CT1 8-18 (SUTURE) ×1
SUT VIC AB 3-0 SH 8-18 (SUTURE) IMPLANT
SUT VICRYL 3-0 RB1 18 ABS (SUTURE) ×1 IMPLANT
SYR 3ML LL SCALE MARK (SYRINGE) IMPLANT
TOWEL GREEN STERILE (TOWEL DISPOSABLE) ×1 IMPLANT
TOWEL GREEN STERILE FF (TOWEL DISPOSABLE) ×1 IMPLANT
TRAP SPECIMEN MUCUS 40CC (MISCELLANEOUS) ×1 IMPLANT
TRAY FOLEY MTR SLVR 16FR STAT (SET/KITS/TRAYS/PACK) ×1 IMPLANT
WATER STERILE IRR 1000ML POUR (IV SOLUTION) ×1 IMPLANT

## 2023-07-24 NOTE — Anesthesia Preprocedure Evaluation (Addendum)
Anesthesia Evaluation  Patient identified by MRN, date of birth, ID band Patient awake    Reviewed: Allergy & Precautions, NPO status , Patient's Chart, lab work & pertinent test results  Airway Mallampati: III  TM Distance: >3 FB Neck ROM: Limited    Dental  (+) Teeth Intact, Dental Advisory Given   Pulmonary asthma , COPD, former smoker   breath sounds clear to auscultation       Cardiovascular hypertension, Pt. on home beta blockers and Pt. on medications  Rhythm:Regular Rate:Normal     Neuro/Psych  PSYCHIATRIC DISORDERS   Bipolar Disorder    Neuromuscular disease    GI/Hepatic Neg liver ROS,GERD  ,,  Endo/Other  negative endocrine ROS    Renal/GU negative Renal ROS     Musculoskeletal  (+) Arthritis ,    Abdominal   Peds  Hematology  (+) Blood dyscrasia, anemia   Anesthesia Other Findings   Reproductive/Obstetrics                             Anesthesia Physical Anesthesia Plan  ASA: 3  Anesthesia Plan: General   Post-op Pain Management: Tylenol PO (pre-op)*   Induction: Intravenous  PONV Risk Score and Plan: 3 and Ondansetron, Dexamethasone and Midazolam  Airway Management Planned: Oral ETT and Video Laryngoscope Planned  Additional Equipment: None  Intra-op Plan:   Post-operative Plan: Extubation in OR  Informed Consent: I have reviewed the patients History and Physical, chart, labs and discussed the procedure including the risks, benefits and alternatives for the proposed anesthesia with the patient or authorized representative who has indicated his/her understanding and acceptance.     Dental advisory given  Plan Discussed with: CRNA  Anesthesia Plan Comments:        Anesthesia Quick Evaluation

## 2023-07-24 NOTE — H&P (Signed)
Chief Complaint   Back and leg pain  History of Present Illness   [name] is a 67 year old man I am seeing in follow-up. While I have treated him for multiple different problems, I have been seeing him recently for right-sided back and leg pain which started a few months ago. 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 At our last visit I did refer him for a transforaminal epidural steroid injection which has provided about a week or so of improvement in back and leg pain. Unfortunately he has had recurrence of symptoms. In addition, over the last few weeks he has noted right-sided anterior groin pain for which he has seen Dr. Despina Hick. He underwent MRI of the right hip a few days ago and has follow-up with Dr. Despina Hick in few weeks.     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   Venous insufficiency     Past Surgical History   Past Surgical History:  Procedure Laterality Date   BACK SURGERY  12/07/2020   cerebral stent     COLONOSCOPY N/A 01/02/2017   Procedure: COLONOSCOPY;  Surgeon: Malissa Hippo, MD;  Location: [facility];  Service: Endoscopy;  Laterality: N/A;   dental implant  09/26/2014   upper and lower dental implant/bridges- permanent   ESOPHAGOGASTRODUODENOSCOPY N/A 01/02/2017   Procedure:  ESOPHAGOGASTRODUODENOSCOPY (EGD);  Surgeon: Malissa Hippo, MD;  Location: [facility];  Service: Endoscopy;  Laterality: N/A;  910   EYE SURGERY Left 09/24/2015   Cataract   IR ANGIO INTRA EXTRACRAN SEL INTERNAL CAROTID BILAT MOD SED  01/20/2017   IR ANGIO VERTEBRAL SEL VERTEBRAL UNI L MOD SED  01/20/2017   JOINT REPLACEMENT Right    Knee   JOINT REPLACEMENT Left    Hip   NOSE SURGERY     RADIOLOGY WITH ANESTHESIA N/A 12/29/2014   Procedure: Embolization;  Surgeon: Lisbeth Renshaw, MD;  Location: [facility] OR;  Service: Radiology;  Laterality: N/A;   SUBCLAVIAN ANGIOGRAM  07/17/2015   TOTAL HIP ARTHROPLASTY Left 01/03/2013   Procedure: TOTAL HIP ARTHROPLASTY ANTERIOR APPROACH;  Surgeon: Loanne Drilling, MD;  Location: [facility];  Service: Orthopedics;  Laterality: Left;   TOTAL KNEE ARTHROPLASTY Right 10/06/2014   Procedure: RIGHT TOTAL KNEE ARTHROPLASTY;  Surgeon: Loanne Drilling, MD;  Location: [facility];  Service: Orthopedics;  Laterality: Right;   WISDOM TOOTH EXTRACTION      Social History   Social History   Tobacco Use   Smoking status: Former    Current packs/day: 0.00    Average packs/day: 0.5 packs/day for 2.4 years (1.2 ttl pk-yrs)  Types: Cigarettes    Start date: 05/23/2015    Quit date: 10/09/2017    Years since quitting: 5.7   Smokeless tobacco: Former    Types: Snuff   Tobacco comments:    Stated "I smoke off and on"  Vaping Use   Vaping status: Never Used  Substance Use Topics   Alcohol use: No    Comment: last had fireball last night 09/10/21   Drug use: No    Medications   Prior to Admission medications   Medication Sig Start Date End Date Taking? Authorizing Provider  Ascorbic Acid (VITAMIN C) 1000 MG tablet Take 1,000 mg by mouth daily. With Zinc (20 mg)   Yes [provider]  aspirin EC 81 MG tablet Take 81 mg by mouth in the morning. Swallow whole.   Yes [provider]  calcium carbonate (TUMS EX) 750 MG chewable tablet Chew 1 tablet  by mouth 2 (two) times daily as needed for indigestion or heartburn.   Yes [provider]  chlorpheniramine (CHLOR-TRIMETON) 4 MG tablet Take 4 mg by mouth 2 (two) times daily as needed for allergies.   Yes [provider]  clindamycin-benzoyl peroxide (BENZACLIN) gel Apply 1 Application topically at bedtime. 01/16/23  Yes [provider]  cyanocobalamin (VITAMIN B12) 1000 MCG tablet Take 1,000 mcg by mouth daily.   Yes [provider]  dexlansoprazole (DEXILANT) 60 MG capsule Take 60 mg by mouth every morning.    Yes [provider]  famotidine (PEPCID) 20 MG tablet Take 20 mg by mouth in the morning and at bedtime. 11/06/20  Yes [provider]  Homeopathic Products (MUSCLE CRAMP COMPLEX PO) Take 1 capsule by mouth in the morning and at bedtime. Cramp Defense Magnesium for Leg Cramps, Muscle Cramps & Muscle Spasms.   Yes [provider]  HYDROcodone-acetaminophen (NORCO) 10-325 MG tablet Take 1 tablet by mouth every 4 (four) hours.   Yes [provider]  ipratropium (ATROVENT) 0.03 % nasal spray Place 1 spray into both nostrils 2 (two) times daily as needed for rhinitis. 07/24/21  Yes [provider]  L-Lysine 500 MG CAPS Take 500 mg by mouth in the morning and at bedtime.   Yes [provider]  lamoTRIgine (LAMICTAL) 200 MG tablet Take 1 tablet (200 mg total) by mouth at bedtime. 04/29/23  Yes Myrlene Broker, MD  lisdexamfetamine (VYVANSE) 70 MG capsule Take 1 capsule (70 mg total) by mouth daily. 04/29/23  Yes Myrlene Broker, MD  methocarbamol (ROBAXIN) 750 MG tablet Take 750 mg by mouth every 8 (eight) hours as needed for muscle spasms.   Yes [provider]  metoprolol succinate (TOPROL-XL) 100 MG 24 hr tablet Take 100 mg by mouth at bedtime. 11/16/20  Yes [provider]  Misc Natural Products (ELDERBERRY IMMUNE COMPLEX PO) Take 1 each by mouth in the morning and at bedtime.   Yes [provider]  montelukast (SINGULAIR) 10 MG tablet Take 10 mg by mouth at bedtime.   Yes [provider]  Multiple Vitamins-Minerals (MULTIVITAMIN WITH MINERALS) tablet Take 1 tablet by mouth daily. Multivitamin for Women (needs the iron)   Yes [provider]  nystatin ointment (MYCOSTATIN) Apply 1 Application topically daily. Apply to outside corner of lips   Yes [provider]  olopatadine (PATADAY) 0.1 % ophthalmic solution Place 1 drop into both eyes 2 (two) times daily as needed for allergies.   Yes [provider]  Povidone, PF, (IVIZIA DRY  EYES) 0.5 % SOLN Place 1 drop into both eyes as needed (DRY EYES).   Yes [provider]  PROAIR HFA 108 (90 Base) MCG/ACT inhaler Inhale 1-2 puffs into the lungs every 6 (six) hours as needed for shortness of breath or wheezing. 10/21/17  Yes [provider]  Probiotic Product (PROBIOTIC GUMMIES PO) Take 1 each by mouth in the morning and at bedtime.   Yes [provider]  promethazine (PHENERGAN) 25 MG tablet Take 25 mg by mouth every 6 (six) hours as needed for nausea or vomiting.   Yes [provider]  tadalafil (CIALIS) 20 MG tablet Take 20 mg by mouth at bedtime.   Yes [provider]  telmisartan (MICARDIS) 80 MG tablet Take 80 mg by mouth daily. 03/06/23  Yes [provider]  testosterone cypionate (DEPOTESTOTERONE CYPIONATE) 200 MG/ML injection Inject 200 mg into the muscle every 14 (fourteen) days.  12/16/14  Yes [provider]    Allergies   Allergies  Allergen Reactions   Naltrexone Anaphylaxis   Clindamycin Other (See Comments)    Abdominal pain "twisted stomach"   Erythromycin     Caused a "twisted stomach"   Penicillins Rash    Review of Systems  ROS  Neurologic Exam  Awake, alert, oriented Memory and concentration grossly intact Speech fluent, appropriate CN grossly intact Motor exam: Upper Extremities Deltoid Bicep Tricep  Grip  Right 5/5 5/5 5/5 5/5  Left 5/5 5/5 5/5 5/5   Lower Extremities IP Quad PF DF EHL  Right 5/5 5/5 5/5 5/5 5/5  Left 5/5 5/5 5/5 5/5 5/5   Sensation grossly intact to LT   Impression  - 67 y.o. male with primarily right-sided back and leg pain related to progressive right L5-S1 foraminal stenosis unresponsive to conservative treatment  Plan  - Will proceed with L5-S1 TLIF  I have reviewed the indications for the procedure as well as the details of the procedure and the expected postoperative course and recovery at length with the patient in the office. We have also reviewed in detail the risks, benefits, and alternatives to the procedure. All questions were answered and Ena Dawley provided informed consent to proceed.  Lisbeth Renshaw, MD [facility]

## 2023-07-24 NOTE — Transfer of Care (Signed)
Immediate Anesthesia Transfer of Care Note  Patient: Philip Gates  Procedure(s) Performed: Transforaminal Lumbar Interbody Fusion Lumbar Five-Sacral One (Right)  Patient Location: PACU  Anesthesia Type:General  Level of Consciousness: awake, alert , and oriented  Airway & Oxygen Therapy: Patient Spontanous Breathing and Patient connected to face mask oxygen  Post-op Assessment: Report given to RN and Post -op Vital signs reviewed and stable  Post vital signs: Reviewed, stable, and unstable  Last Vitals:  Vitals Value Taken Time  BP 147/83 07/24/23 1145  Temp    Pulse 112 07/24/23 1148  Resp 17 07/24/23 1148  SpO2 95 % 07/24/23 1148  Vitals shown include unfiled device data.  Last Pain:  Vitals:   07/24/23 1139  TempSrc:   PainSc: (P) Asleep      Patients Stated Pain Goal: 2 (07/24/23 0717)  Complications:  Encounter Notable Events  Notable Event Outcome Phase Comment  Difficult to intubate - expected  Intraprocedure Filed from anesthesia note documentation.

## 2023-07-24 NOTE — Progress Notes (Signed)
Patient alert and oriented. Report given to receiving nurse all questions answered. Patient transfer to 5N per order.

## 2023-07-24 NOTE — Anesthesia Postprocedure Evaluation (Signed)
Anesthesia Post Note  Patient: Philip Gates  Procedure(s) Performed: Transforaminal Lumbar Interbody Fusion Lumbar Five-Sacral One (Right)     Patient location during evaluation: PACU Anesthesia Type: General Level of consciousness: awake and alert Pain management: pain level controlled Vital Signs Assessment: post-procedure vital signs reviewed and stable Respiratory status: spontaneous breathing, nonlabored ventilation, respiratory function stable and patient connected to nasal cannula oxygen Cardiovascular status: blood pressure returned to baseline and stable Postop Assessment: no apparent nausea or vomiting Anesthetic complications: yes  Encounter Notable Events  Notable Event Outcome Phase Comment  Difficult to intubate - expected  Intraprocedure Filed from anesthesia note documentation.    Last Vitals:  Vitals:   07/24/23 1300 07/24/23 1320  BP: 130/89 (!) 157/98  Pulse: (!) 114 (!) 110  Resp: 16 18  Temp: (!) 36.4 C 36.6 C  SpO2: 98% 99%    Last Pain:  Vitals:   07/24/23 1320  TempSrc: Oral  PainSc:                  Shelton Silvas

## 2023-07-24 NOTE — Anesthesia Procedure Notes (Addendum)
Procedure Name: Intubation Date/Time: 07/24/2023 8:13 AM  Performed by: Hilda Lias, CRNAPre-anesthesia Checklist: Patient identified, Emergency Drugs available, Suction available and Patient being monitored Patient Re-evaluated:Patient Re-evaluated prior to induction Oxygen Delivery Method: Circle System Utilized Preoxygenation: Pre-oxygenation with 100% oxygen Induction Type: IV induction Ventilation: Mask ventilation without difficulty Laryngoscope Size: Glidescope and 4 Grade View: Grade III Tube type: Oral Tube size: 8.0 mm Number of attempts: 1 Airway Equipment and Method: Stylet, Oral airway and Video-laryngoscopy Placement Confirmation: ETT inserted through vocal cords under direct vision, positive ETCO2 and breath sounds checked- equal and bilateral Secured at: 24 cm Tube secured with: Tape Dental Injury: Teeth and Oropharynx as per pre-operative assessment  Difficulty Due To: Difficulty was anticipated Comments: Easy intubation with Glidescope/ Pt has limited ROM; Full set of front teeth "cemented in". No damage to implants during intubation. Soft bite block inserted prior to turning the patient prone.

## 2023-07-24 NOTE — Op Note (Signed)
NEUROSURGERY OPERATIVE NOTE   PREOP DIAGNOSIS:  1. Lumbar spondylosis with radiculopathy, L5-S1  POSTOP DIAGNOSIS: Same  PROCEDURE: 1. Right L5 laminectomy with complete facetectomy for decompression of exiting nerve roots, more than would be required for placement of interbody graft 2. Placement of anterior interbody device - Medtronic expandable cage 3. Posterior non-segmental instrumentation using cortical pedicle screws at L5 - S1 - Medtronic Solera 4. Interbody arthrodesis, L5-S1 5. Posterolateral arthrodesis, L5-S1 6. Use of locally harvested bone autograft 7. Use of non-structural bone allograft - ProteiOs, DBM  SURGEON: Dr. Lisbeth Renshaw, MD  ASSISTANT: Dr. Julio Sicks, MD  ANESTHESIA: General Endotracheal  EBL: 250cc  SPECIMENS: None  DRAINS: None  COMPLICATIONS: None immediate  CONDITION: Hemodynamically stable to PACU  HISTORY: Philip Gates is a 67 y.o. male who has been followed in the outpatient clinic with back and primarily right sided leg pain.  The patient underwent multiple different conservative treatments without improvement in his symptoms.  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  He therefore elected to proceed with surgical decompression and fusion.  Risks, benefits, and alternative treatments were reviewed in detail in the office. After all questions were answered, informed consent was obtained and witnessed.  PROCEDURE IN DETAIL: The patient was brought to the operating room via stretcher. After induction of general anesthesia, the patient was positioned on the operative table in the prone position. All pressure points were meticulously padded. Incision was then marked out and prepped and draped in the usual sterile fashion.  After timeout was conducted, skin was infiltrated with local anesthetic. Skin incision was then made inferior to the previous incision and Bovie electrocautery was used to dissect the  subcutaneous tissue until the lumbodorsal fascia was identified and incised. The muscle was then elevated in the subperiosteal plane and the L4, L5, and superior aspect of S1 lamina and L5-S1 facet complexes were identified. Self-retaining retractors were then placed. Lateral fluoroscopy was taken with a dissector in the L5-S1 interspace to confirm our location.  At this point attention was turned to decompression. Complete right sided L5 laminectomy was completed with a high-speed drill and Kerrison punches.  Normal dura was identified.  A ball-tipped dissector was then used to identify the foramen.  High-speed drill was used to cut across the pars interarticularis and the inferior articulating process of L5 was removed.  The exiting nerve root and the traversing nerve root was then identified.  The Kerrison punches and high-speed drill were used to remove the medial and superior aspect of the superior articulating process of S1 in order to both expose the disc space as well as completely decompressed the foramen.  I was then able to identify the exiting right L5 nerve root.  I was able to trace it out into the extraforaminal zone lateral to the edge of the disc indicating good decompression.   Disc space was then identified, incised on the right, and using a combination of shavers, curettes and rongeurs, complete discectomy was completed. Endplates were prepared, and bone harvested during decompression was mixed with proteiOs and packed into the interspace. A 7mm expandable cage was tapped into place. Cage was expanded to achieve good endplate apposition.  Good position was confirmed with fluoroscopy.  At this point, the entry points for bilateral L5 and S1 cortical pedicle screws were identified using standard anatomic landmarks and lateral fluoro. Pilot holes were then drilled and tapped to 6.5 x 35mm. Screws were then placed in L5 and S1. Prebent  lordotic rod was then sized and placed into the pedicle  screws. Set screws were placed and final tightened. Final AP and lateral fluoroscopic images confirmed good position.  Hemostasis was secured and confirmed with bipolar cautery and morcellized gelfoam with thrombin. The wound was then irrigated with copious amounts of antibiotic saline, then closed in standard fashion using a combination of interrupted 0 and 3-0 Vicryl stitches in the muscular, fascial, and subcutaneous layers. Skin was then closed using standard Dermabond. Sterile dressing was then applied. The patient was then transferred to the stretcher, extubated, and taken to the postanesthesia care unit in stable hemodynamic condition.  At the end of the case all sponge, needle, cottonoid, and instrument counts were correct.   Lisbeth Renshaw, MD [facility]

## 2023-07-25 DIAGNOSIS — M4727 Other spondylosis with radiculopathy, lumbosacral region: Secondary | ICD-10-CM | POA: Diagnosis not present

## 2023-07-25 MED ORDER — METHOCARBAMOL 750 MG PO TABS: 750.0000 mg | ORAL_TABLET | Freq: Three times a day (TID) | ORAL | 0 refills | Status: DC | PRN

## 2023-07-25 MED ORDER — OXYCODONE-ACETAMINOPHEN 5-325 MG PO TABS: 1.0000 | ORAL_TABLET | ORAL | 0 refills | Status: DC | PRN

## 2023-07-25 MED ORDER — DOCUSATE SODIUM 100 MG PO CAPS: 100.0000 mg | ORAL_CAPSULE | Freq: Two times a day (BID) | ORAL | 0 refills | Status: DC

## 2023-07-25 NOTE — Plan of Care (Signed)
  Problem: Education: Goal: Knowledge of General Education information will improve Description: Including pain rating scale, medication(s)/side effects and non-pharmacologic comfort measures Outcome: Adequate for Discharge   Problem: Health Behavior/Discharge Planning: Goal: Ability to manage health-related needs will improve Outcome: Adequate for Discharge   Problem: Clinical Measurements: Goal: Ability to maintain clinical measurements within normal limits will improve Outcome: Adequate for Discharge Goal: Will remain free from infection Outcome: Adequate for Discharge Goal: Diagnostic test results will improve Outcome: Adequate for Discharge Goal: Respiratory complications will improve Outcome: Adequate for Discharge Goal: Cardiovascular complication will be avoided Outcome: Adequate for Discharge   Problem: Activity: Goal: Risk for activity intolerance will decrease Outcome: Adequate for Discharge   Problem: Nutrition: Goal: Adequate nutrition will be maintained Outcome: Adequate for Discharge   Problem: Coping: Goal: Level of anxiety will decrease Outcome: Adequate for Discharge   Problem: Elimination: Goal: Will not experience complications related to bowel motility Outcome: Adequate for Discharge Goal: Will not experience complications related to urinary retention Outcome: Adequate for Discharge   Problem: Pain Management: Goal: General experience of comfort will improve Outcome: Adequate for Discharge   Problem: Safety: Goal: Ability to remain free from injury will improve Outcome: Adequate for Discharge   Problem: Skin Integrity: Goal: Risk for impaired skin integrity will decrease Outcome: Adequate for Discharge   Problem: Education: Goal: Ability to verbalize activity precautions or restrictions will improve Outcome: Adequate for Discharge Goal: Knowledge of the prescribed therapeutic regimen will improve Outcome: Adequate for Discharge Goal:  Understanding of discharge needs will improve Outcome: Adequate for Discharge   Problem: Activity: Goal: Ability to avoid complications of mobility impairment will improve Outcome: Adequate for Discharge Goal: Ability to tolerate increased activity will improve Outcome: Adequate for Discharge Goal: Will remain free from falls Outcome: Adequate for Discharge   Problem: Bowel/Gastric: Goal: Gastrointestinal status for postoperative course will improve Outcome: Adequate for Discharge   Problem: Clinical Measurements: Goal: Ability to maintain clinical measurements within normal limits will improve Outcome: Adequate for Discharge Goal: Postoperative complications will be avoided or minimized Outcome: Adequate for Discharge Goal: Diagnostic test results will improve Outcome: Adequate for Discharge   Problem: Pain Management: Goal: Pain level will decrease Outcome: Adequate for Discharge   Problem: Skin Integrity: Goal: Will show signs of wound healing Outcome: Adequate for Discharge   Problem: Health Behavior/Discharge Planning: Goal: Identification of resources available to assist in meeting health care needs will improve Outcome: Adequate for Discharge   Problem: Bladder/Genitourinary: Goal: Urinary functional status for postoperative course will improve Outcome: Adequate for Discharge

## 2023-07-25 NOTE — Care Management Obs Status (Signed)
MEDICARE OBSERVATION STATUS NOTIFICATION   Patient Details  Name: Philip Gates MRN: [id number] Date of Birth: April 06, 1956   Medicare Observation Status Notification Given:  Yes    Ronny Bacon, RN 07/25/2023, 7:14 AM

## 2023-07-25 NOTE — Progress Notes (Signed)
Discharge teaching complete, meds, diet, activity, follow up appointments reviewed and all questions answered. Copy of instructions given to patient and prescriptions sent to pharmacy. Patient discharged home via wheelchair with wife.

## 2023-07-25 NOTE — Discharge Instructions (Signed)
Wound Care Keep incision covered and dry until post op day 3. You may remove the Honeycomb dressing on post op day 3. Leave steri-strips on back.  They will fall off by themselves. Do not put any creams, lotions, or ointments on incision. You are fine to shower. Let water run over incision and pat dry.  Activity Walk each and every day, increasing distance each day. No lifting greater than 5 lbs.  Avoid excessive back motion. No driving for 2 weeks; may ride as a passenger locally.  Diet Resume your normal diet.   Return to Work Will be discussed at your follow up appointment.  Call Your Doctor If Any of These Occur Redness, drainage, or swelling at the wound.  Temperature greater than 101 degrees. Severe pain not relieved by pain medication. Incision starts to come apart.  Follow Up Appt Call [phone] today for appointment in 2-3 weeks if you don't already have one or for any problems.

## 2023-07-25 NOTE — Evaluation (Signed)
Physical Therapy Brief Evaluation and Discharge Note Patient Details Name: Philip Gates MRN: [id number] DOB: 28-Apr-1956 Today's Date: 07/25/2023   History of Present Illness  67 y.o. male who has been followed in the outpatient clinic with back and primarily right sided leg pain.  The patient underwent multiple different conservative treatments without improvement in his symptoms.  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  He therefore elected to proceed with surgical decompression and fusion. He is now s/p TLIF L5-S1(right). PMH: venous insufficiency, chronic neck pain, HTN, hearing loss, dyspnea, COPD, cerebral aneurysm, cellulitis, BPH, bipolar 1 disorder, asthma, arthritis, and ADHD.  Clinical Impression  Patient evaluated by Physical Therapy with no further acute PT needs identified. Pt reports fair pain control with mild radicular symptoms to right glute after ambulation and no numbness/tingling. Pt is mobilizing well, ambulating 540 ft with a RW and negotiated 1 step to simulate home set up. Education provided regarding activity recommendations/progression, brace use, spinal precautions. All education has been completed and the patient has no further questions. No follow-up Physical Therapy or equipment needs. PT is signing off. Thank you for this referral.      PT Assessment Patient does not need any further PT services  Assistance Needed at Discharge  PRN    Equipment Recommendations None recommended by PT  Recommendations for Other Services       Precautions/Restrictions Precautions Precautions: Back Precaution Booklet Issued: Yes (comment) Required Braces or Orthoses: Spinal Brace Spinal Brace: Lumbar corset;Applied in sitting position Restrictions Weight Bearing Restrictions: No        Mobility  Bed Mobility Rolling: Modified independent (Device/Increase time) Supine/Sidelying to sit: Modified independent (Device/Increased  time) Sit to supine/sidelying: Modified independent (Device/Increased time)    Transfers Overall transfer level: Independent                      Ambulation/Gait Ambulation/Gait assistance: Modified independent (Device/Increase time) Gait Distance (Feet): 540 Feet Assistive device: Rolling walker (2 wheels) Gait Pattern/deviations: Step-through pattern, Decreased stride length, Trunk flexed   General Gait Details: Verbal cues for upright posture  Home Activity Instructions    Stairs Stairs: Yes Stairs assistance: Modified independent (Device/Increase time) Stair Management: One rail Right Number of Stairs: 1    Modified Rankin (Stroke Patients Only)        Balance Overall balance assessment: Mild deficits observed, not formally tested                        Pertinent Vitals/Pain PT - Brief Vital Signs All Vital Signs Stable: Yes Pain Assessment Pain Assessment: 0-10 Pain Score: 5  Pain Location: post surgical site, R glute Pain Descriptors / Indicators: Operative site guarding, Grimacing Pain Intervention(s): Monitored during session, Premedicated before session     Home Living Family/patient expects to be discharged to:: Private residence Living Arrangements: Spouse/significant other Available Help at Discharge: Family Home Environment: Stairs to enter  Benson of Steps: 1 Home Equipment: Agricultural consultant (2 wheels);Shower seat - built in;Cane - single point;Rollator (4 wheels)        Prior Function Level of Independence: Independent Comments: Enjoys golf    UE/LE Assessment   UE ROM/Strength/Tone/Coordination: WFL    LE ROM/Strength/Tone/Coordination: WFL      Communication   Communication Communication: No apparent difficulties     Cognition Overall Cognitive Status: Appears within functional limits for tasks assessed/performed       General Comments  Exercises     Assessment/Plan    PT Problem List          PT Visit Diagnosis Pain;Difficulty in walking, not elsewhere classified (R26.2)    No Skilled PT All education completed;Patient will have necessary level of assist by caregiver at discharge;Patient is modified independent with all activity/mobility   Co-evaluation                AMPAC 6 Clicks Help needed turning from your back to your side while in a flat bed without using bedrails?: None Help needed moving from lying on your back to sitting on the side of a flat bed without using bedrails?: None Help needed moving to and from a bed to a chair (including a wheelchair)?: None Help needed standing up from a chair using your arms (e.g., wheelchair or bedside chair)?: None Help needed to walk in hospital room?: None Help needed climbing 3-5 steps with a railing? : None 6 Click Score: 24      End of Session Equipment Utilized During Treatment: Back brace Activity Tolerance: Patient tolerated treatment well Patient left: in bed;with call bell/phone within reach Nurse Communication: Mobility status PT Visit Diagnosis: Pain;Difficulty in walking, not elsewhere classified (R26.2) Pain - part of body:  (back)     Time: 7829-5621 PT Time Calculation (min) (ACUTE ONLY): 39 min  Charges:   PT Evaluation $PT Eval Low Complexity: 1 Low PT Treatments $Gait Training: 8-22 mins $Therapeutic Activity: 8-22 mins    Lillia Pauls, PT, DPT Acute Rehabilitation Services Office [phone]   Norval Morton  07/25/2023, 9:23 AM

## 2023-07-25 NOTE — Discharge Summary (Signed)
Physician Discharge Summary     Providing Compassionate, Quality Care - Together   Patient ID: Philip Gates MRN: [id number] DOB/AGE: [birth year] 67 y.o.  Admit date: 07/24/2023 Discharge date: 07/25/2023  Admission Diagnoses: Lumbar radiculopathy  Discharge Diagnoses:  Principal Problem:   Other spondylosis with radiculopathy, lumbar region   Discharged Condition: good  Hospital Course: Patient underwent an L5-S1 fusion by Dr. Conchita Paris on 07/24/2023. He was admitted to 5N30 following recovery from anesthesia in the PACU. His postoperative course has been uncomplicated. He has worked with physical and therapy who feels the patient is ready for discharge home. He is ambulating independently and without difficulty. He is tolerating a normal diet. He is not having any bowel or bladder dysfunction. His pain is well-controlled with oral pain medication. He is ready for discharge home.   Consults: PT  Significant Diagnostic Studies: radiology: DG Lumbar Spine 2-3 Views  Result Date: 07/24/2023 CLINICAL DATA:  Elective surgery. EXAM: LUMBAR SPINE - 2-3 VIEW COMPARISON:  04/25/2023 FINDINGS: Three fluoroscopic spot views of the lumbar spine obtained in the operating room. New posterior rod with intrapedicular screw fusion L5-S1 with interbody spacer. There is previous L3-L4 fusion hardware. Fluoroscopy time 30.9 seconds. Dose 31.784 mGy. IMPRESSION: Intraoperative fluoroscopy during L5-S1 fusion. Electronically Signed   By: Narda Rutherford M.D.   On: 07/24/2023 13:37   DG C-Arm 1-60 Min-No Report  Result Date: 07/24/2023 Fluoroscopy was utilized by the requesting physician.  No radiographic interpretation.   DG C-Arm 1-60 Min-No Report  Result Date: 07/24/2023 Fluoroscopy was utilized by the requesting physician.  No radiographic interpretation.   DG C-Arm 1-60 Min-No Report  Result Date: 07/24/2023 Fluoroscopy was utilized by the requesting physician.  No radiographic  interpretation.     Treatments: surgery:  1. Right L5 laminectomy with complete facetectomy for decompression of exiting nerve roots, more than would be required for placement of interbody graft 2. Placement of anterior interbody device - Medtronic expandable cage 3. Posterior non-segmental instrumentation using cortical pedicle screws at L5 - S1 - Medtronic Solera 4. Interbody arthrodesis, L5-S1 5. Posterolateral arthrodesis, L5-S1 6. Use of locally harvested bone autograft 7. Use of non-structural bone allograft - ProteiOs, DBM  Discharge Exam: Blood pressure 120/81, pulse 86, temperature 98.1 F (36.7 C), temperature source Oral, resp. rate 18, height 5\' 11"  (1.803 m), weight 90.7 kg, SpO2 96%.  Alert and oriented x 4 PERRLA CN II-XII grossly intact MAE, Strength and sensation intact Incision is covered with Honeycomb dressing; Dressing is clean, dry, and intact   Disposition: Discharge disposition: 01-Home or Self Care       Discharge Instructions     Call MD for:  difficulty breathing, headache or visual disturbances   Complete by: As directed    Call MD for:  hives   Complete by: As directed    Call MD for:  persistant dizziness or light-headedness   Complete by: As directed    Call MD for:  persistant nausea and vomiting   Complete by: As directed    Call MD for:  redness, tenderness, or signs of infection (pain, swelling, redness, odor or green/yellow discharge around incision site)   Complete by: As directed    Call MD for:  severe uncontrolled pain   Complete by: As directed    Diet - low sodium heart healthy   Complete by: As directed    If the dressing is still on your incision site when you go home, remove it on the third day  after your surgery date. Remove dressing if it begins to fall off, or if it is dirty or damaged before the third day.   Complete by: As directed    Incentive spirometry RT   Complete by: As directed    Increase activity slowly    Complete by: As directed       Allergies as of 07/25/2023       Reactions   Naltrexone Anaphylaxis   Clindamycin Other (See Comments)   Abdominal pain "twisted stomach"   Erythromycin    Caused a "twisted stomach"   Penicillins Rash        Medication List     STOP taking these medications    HYDROcodone-acetaminophen 10-325 MG tablet Commonly known as: NORCO       TAKE these medications    aspirin EC 81 MG tablet Take 81 mg by mouth in the morning. Swallow whole.   calcium carbonate 750 MG chewable tablet Commonly known as: TUMS EX Chew 1 tablet by mouth 2 (two) times daily as needed for indigestion or heartburn.   chlorpheniramine 4 MG tablet Commonly known as: CHLOR-TRIMETON Take 4 mg by mouth 2 (two) times daily as needed for allergies.   clindamycin-benzoyl peroxide gel Commonly known as: BENZACLIN Apply 1 Application topically at bedtime.   cyanocobalamin 1000 MCG tablet Commonly known as: VITAMIN B12 Take 1,000 mcg by mouth daily.   dexlansoprazole 60 MG capsule Commonly known as: DEXILANT Take 60 mg by mouth every morning.   docusate sodium 100 MG capsule Commonly known as: COLACE Take 1 capsule (100 mg total) by mouth 2 (two) times daily.   ELDERBERRY IMMUNE COMPLEX PO Take 1 each by mouth in the morning and at bedtime.   famotidine 20 MG tablet Commonly known as: PEPCID Take 20 mg by mouth in the morning and at bedtime.   ipratropium 0.03 % nasal spray Commonly known as: ATROVENT Place 1 spray into both nostrils 2 (two) times daily as needed for rhinitis.   iVIZIA Dry Eyes 0.5 % Soln Generic drug: Povidone (PF) Place 1 drop into both eyes as needed (DRY EYES).   L-Lysine 500 MG Caps Take 500 mg by mouth in the morning and at bedtime.   lamoTRIgine 200 MG tablet Commonly known as: LAMICTAL Take 1 tablet (200 mg total) by mouth at bedtime.   lisdexamfetamine 70 MG capsule Commonly known as: Vyvanse Take 1 capsule (70 mg total)  by mouth daily.   methocarbamol 750 MG tablet Commonly known as: ROBAXIN Take 1 tablet (750 mg total) by mouth every 8 (eight) hours as needed for muscle spasms.   metoprolol succinate 100 MG 24 hr tablet Commonly known as: TOPROL-XL Take 100 mg by mouth at bedtime.   montelukast 10 MG tablet Commonly known as: SINGULAIR Take 10 mg by mouth at bedtime.   multivitamin with minerals tablet Take 1 tablet by mouth daily. Multivitamin for Women (needs the iron)   MUSCLE CRAMP COMPLEX PO Take 1 capsule by mouth in the morning and at bedtime. Cramp Defense Magnesium for Leg Cramps, Muscle Cramps & Muscle Spasms.   nystatin ointment Commonly known as: MYCOSTATIN Apply 1 Application topically daily. Apply to outside corner of lips   oxyCODONE-acetaminophen 5-325 MG tablet Commonly known as: Percocet Take 1-2 tablets by mouth every 4 (four) hours as needed for severe pain (pain score 7-10).   Pataday 0.1 % ophthalmic solution Generic drug: olopatadine Place 1 drop into both eyes 2 (two) times daily as needed for  allergies.   ProAir HFA 108 (90 Base) MCG/ACT inhaler Generic drug: albuterol Inhale 1-2 puffs into the lungs every 6 (six) hours as needed for shortness of breath or wheezing.   PROBIOTIC GUMMIES PO Take 1 each by mouth in the morning and at bedtime.   promethazine 25 MG tablet Commonly known as: PHENERGAN Take 25 mg by mouth every 6 (six) hours as needed for nausea or vomiting.   tadalafil 20 MG tablet Commonly known as: CIALIS Take 20 mg by mouth at bedtime.   telmisartan 80 MG tablet Commonly known as: MICARDIS Take 80 mg by mouth daily.   testosterone cypionate 200 MG/ML injection Commonly known as: DEPOTESTOSTERONE CYPIONATE Inject 200 mg into the muscle every 14 (fourteen) days.   vitamin C 1000 MG tablet Take 1,000 mg by mouth daily. With Zinc (20 mg)               Discharge Care Instructions  (From admission, onward)           Start      Ordered   07/25/23 0000  If the dressing is still on your incision site when you go home, remove it on the third day after your surgery date. Remove dressing if it begins to fall off, or if it is dirty or damaged before the third day.        07/25/23 1230             Signed: Val Eagle, DNP, AGNP-C Nurse Practitioner  [facility] [address], [place], [place] [address] P: [phone]    F: [phone]  07/25/2023, 12:32 PM

## 2023-07-25 NOTE — Progress Notes (Signed)
Orthopedic Tech Progress Note Patient Details:  Philip Gates 1956/07/20 [id number]  Ortho Devices Type of Ortho Device: Lumbar corsett Ortho Device/Splint Location: Lumbar spine Ortho Device/Splint Interventions: Application   Post Interventions Patient Tolerated: Well Instructions Provided: Adjustment of device  Philip Gates 07/25/2023, 9:04 AM

## 2023-07-25 NOTE — Evaluation (Signed)
Occupational Therapy Evaluation Patient Details Name: Philip Gates MRN: [id number] DOB: [birth year] Today's Date: 07/25/2023   History of Present Illness 67 y.o. male who has been followed in the outpatient clinic with back and primarily right sided leg pain.  The patient underwent multiple different conservative treatments without improvement in his symptoms.  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  He therefore elected to proceed with surgical decompression and fusion. He is now s/p TLIF L5-S1(right). PMH: venous insufficiency, chronic neck pain, HTN, hearing loss, dyspnea, COPD, cerebral aneurysm, cellulitis, BPH, bipolar 1 disorder, asthma, arthritis, and ADHD.   Clinical Impression   Pt with mild c/o post op back pain, he recalls his back precautions and return demonstrated use of compensatory strategies to complete transfers and ADLs. Pt is close to baseline and displays no need for physical assist to complete ADLs. Pt has no further acute skilled OT needs. No follow-up OT needed.        If plan is discharge home, recommend the following: Assist for transportation;Assistance with cooking/housework    Functional Status Assessment  Patient has had a recent decline in their functional status and demonstrates the ability to make significant improvements in function in a reasonable and predictable amount of time.  Equipment Recommendations  None recommended by OT (Pt has rec DME)    Recommendations for Other Services       Precautions / Restrictions Precautions Precautions: Back Precaution Booklet Issued: Yes (comment) Precaution Comments: recalls 3/3 precautions Required Braces or Orthoses: Spinal Brace Spinal Brace: Lumbar corset;Applied in sitting position Restrictions Weight Bearing Restrictions: No      Mobility Bed Mobility Overal bed mobility: Modified Independent   Rolling: Modified independent (Device/Increase time)          General bed mobility comments: Good use of log roll    Transfers Overall transfer level: Independent Equipment used: None                      Balance Overall balance assessment: Mild deficits observed, not formally tested                                         ADL either performed or assessed with clinical judgement   ADL Overall ADL's : Modified independent                                       General ADL Comments: Pt transferrring in room with Mod I, completing LBD and LBB with supervision and using fiuger four position to maintain POB precautions. Pt edcuated on compensatory sink strategies, he return demonstrated car transfer and capability to perform self wiping while seated.     Vision         Perception         Praxis         Pertinent Vitals/Pain Pain Assessment Pain Assessment: 0-10 Faces Pain Scale: Hurts little more Pain Location: post surgical site, R glute Pain Descriptors / Indicators: Operative site guarding, Grimacing, Aching Pain Intervention(s): Limited activity within patient's tolerance, Monitored during session     Extremity/Trunk Assessment Upper Extremity Assessment Upper Extremity Assessment: Overall WFL for tasks assessed   Lower Extremity Assessment Lower Extremity Assessment: Overall WFL for tasks assessed   Cervical / Trunk Assessment Cervical /  Trunk Assessment: Back Surgery   Communication Communication Communication: No apparent difficulties   Cognition Arousal: Alert Behavior During Therapy: WFL for tasks assessed/performed Overall Cognitive Status: Within Functional Limits for tasks assessed                                       General Comments  VSS on RA    Exercises     Shoulder Instructions      Home Living Family/patient expects to be discharged to:: Private residence Living Arrangements: Spouse/significant other Available Help at Discharge:  Family Type of Home: House Home Access: Stairs to enter Secretary/administrator of Steps: 1   Home Layout: One level     Bathroom Shower/Tub: Producer, television/film/video: Handicapped height     Home Equipment: Agricultural consultant (2 wheels);Shower seat - built in;Cane - single point;Rollator (4 wheels)          Prior Functioning/Environment Prior Level of Function : Independent/Modified Independent             Mobility Comments: Ind ADLs Comments: Ind        OT Problem List: Pain      OT Treatment/Interventions:      OT Goals(Current goals can be found in the care plan section) Acute Rehab OT Goals Patient Stated Goal: TO go home OT Goal Formulation: All assessment and education complete, DC therapy Time For Goal Achievement: 08/08/23 Potential to Achieve Goals: Good  OT Frequency:      Co-evaluation              AM-PAC OT "6 Clicks" Daily Activity     Outcome Measure Help from another person eating meals?: None Help from another person taking care of personal grooming?: None Help from another person toileting, which includes using toliet, bedpan, or urinal?: None Help from another person bathing (including washing, rinsing, drying)?: A Little Help from another person to put on and taking off regular upper body clothing?: None Help from another person to put on and taking off regular lower body clothing?: A Little 6 Click Score: 22   End of Session Equipment Utilized During Treatment: Rolling walker (2 wheels) Nurse Communication: Mobility status  Activity Tolerance: Patient tolerated treatment well Patient left: Other (comment);with family/visitor present (Pt ambulating to bathroom)  OT Visit Diagnosis: Pain Pain - part of body:  (back)                Time: 5409-8119 OT Time Calculation (min): 24 min Charges:  OT General Charges $OT Visit: 1 Visit OT Evaluation $OT Eval Moderate Complexity: 1 Mod OT Treatments $Self Care/Home Management :  8-22 mins  07/25/2023  AB, OTR/L  Acute Rehabilitation Services  Office: [phone]   Tristan Schroeder 07/25/2023, 12:51 PM

## 2023-07-27 MED FILL — Thrombin For Soln 20000 Unit: CUTANEOUS | Qty: 1 | Status: AC

## 2023-07-28 ENCOUNTER — Encounter (HOSPITAL_COMMUNITY): Payer: Self-pay | Admitting: Neurosurgery

## 2023-07-30 ENCOUNTER — Telehealth (HOSPITAL_COMMUNITY): Payer: PPO | Admitting: Psychiatry

## 2023-07-30 ENCOUNTER — Encounter (HOSPITAL_COMMUNITY): Payer: Self-pay | Admitting: Psychiatry

## 2023-07-30 DIAGNOSIS — F900 Attention-deficit hyperactivity disorder, predominantly inattentive type: Secondary | ICD-10-CM

## 2023-07-30 DIAGNOSIS — F3162 Bipolar disorder, current episode mixed, moderate: Secondary | ICD-10-CM

## 2023-07-30 MED ORDER — LISDEXAMFETAMINE DIMESYLATE 70 MG PO CAPS: 70.0000 mg | ORAL_CAPSULE | Freq: Every day | ORAL | 0 refills | Status: DC

## 2023-07-30 MED ORDER — LAMOTRIGINE 200 MG PO TABS: 200.0000 mg | ORAL_TABLET | Freq: Every day | ORAL | 0 refills | Status: DC

## 2023-07-30 NOTE — Progress Notes (Signed)
Virtual Visit via Telephone Note  I connected with Philip Gates on 07/30/23 at  9:40 AM EST by telephone and verified that I am speaking with the correct person using two identifiers.  Location: Patient: home Provider: office   I discussed the limitations, risks, security and privacy concerns of performing an evaluation and management service by telephone and the availability of in person appointments. I also discussed with the patient that there may be a patient responsible charge related to this service. The patient expressed understanding and agreed to proceed.    I discussed the assessment and treatment plan with the patient. The patient was provided an opportunity to ask questions and all were answered. The patient agreed with the plan and demonstrated an understanding of the instructions.   The patient was advised to call back or seek an in-person evaluation if the symptoms worsen or if the condition fails to improve as anticipated.  I provided 15 minutes of non-face-to-face time during this encounter.   Diannia Ruder, MD  [facility] MD/PA/NP OP Progress Note  07/30/2023 9:51 AM Philip Gates  MRN:  [id number]  Chief Complaint:  Chief Complaint  Patient presents with   Manic Behavior   ADHD   Follow-up   HPI: This patient is a 67 year old married white male who lives with his wife in [place]. 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   The patient returns for follow-up after 3 months regarding his ADHD and mood swings.  Overall he is doing well.  However he had back surgery last week and is obviously still recovering.  He is hoping he will be in less pain once that everything heals.  He states that the pain was causing him to be more irritable but the medications are still helping with his mood and he denies significant irritability or manic episodes or depression.  He is focusing well with the Vyvanse Visit Diagnosis:     ICD-10-CM   1. Attention deficit hyperactivity disorder (ADHD), predominantly inattentive type  F90.0     2. 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   Venous insufficiency     Past Surgical History:  Procedure Laterality Date   BACK SURGERY  12/07/2020   cerebral stent     COLONOSCOPY N/A 01/02/2017   Procedure: COLONOSCOPY;  Surgeon: Malissa Hippo, MD;  [facility]: [facility];  Service: Endoscopy;  Laterality: N/A;   dental implant  09/26/2014   upper and lower dental implant/bridges- permanent  ESOPHAGOGASTRODUODENOSCOPY N/A 01/02/2017   Procedure: ESOPHAGOGASTRODUODENOSCOPY (EGD);  Surgeon: Malissa Hippo, MD;  Location: [facility];  Service: Endoscopy;  Laterality: N/A;  910   EYE SURGERY Left 09/24/2015   Cataract   IR ANGIO INTRA EXTRACRAN SEL INTERNAL CAROTID BILAT MOD SED  01/20/2017   IR ANGIO VERTEBRAL SEL VERTEBRAL UNI L MOD SED  01/20/2017   JOINT REPLACEMENT Right    Knee   JOINT REPLACEMENT Left    Hip   NOSE SURGERY     RADIOLOGY WITH ANESTHESIA N/A 12/29/2014   Procedure: Embolization;  Surgeon: Lisbeth Renshaw, MD;  [facility] OR;  Service: Radiology;  Laterality: N/A;    SUBCLAVIAN ANGIOGRAM  07/17/2015   TOTAL HIP ARTHROPLASTY Left 01/03/2013   Procedure: TOTAL HIP ARTHROPLASTY ANTERIOR APPROACH;  Surgeon: Loanne Drilling, MD;  Location: [facility] ORS;  Service: Orthopedics;  Laterality: Left;   TOTAL KNEE ARTHROPLASTY Right 10/06/2014   Procedure: RIGHT TOTAL KNEE ARTHROPLASTY;  Surgeon: Loanne Drilling, MD;  Location: [facility] OR[facility];  Service: Orthopedics;  Laterality: Right;   TRANSFORAMINAL LUMBAR INTERBODY FUSION (TLIF) WITH PEDICLE SCREW FIXATION 1 LEVEL Right 07/24/2023   Procedure: Transforaminal Lumbar Interbody Fusion Lumbar Five-Sacral One;  Surgeon: Lisbeth Renshaw, MD;  Location: [facility] OR;  Service: Neurosurgery;  Laterality: Right;  3C   WISDOM TOOTH EXTRACTION      Family Psychiatric History: See below  Family History:  Family History  Problem Relation Age of Onset   Arrhythmia Mother        Has pacemaker   Hypertension Mother    Heart disease Mother    Mental illness Mother    Varicose Veins Mother    Depression Mother    Colon cancer Father    Cancer Father        Prostate and Colon   Diabetes Father    Hypertension Father    Cancer - Colon Father    Depression Sister     Social History:  Social History   Socioeconomic History   Marital status: Married    Spouse name: Not on file   Number of children: Not on file   Years of education: Not on file   Highest education level: Not on file  Occupational History   Not on file  Tobacco Use   Smoking status: Former    Current packs/day: 0.00    Average packs/day: 0.5 packs/day for 2.4 years (1.2 ttl pk-yrs)    Types: Cigarettes    Start date: 05/23/2015    Quit date: 10/09/2017    Years since quitting: 5.8   Smokeless tobacco: Former    Types: Snuff   Tobacco comments:    Stated "I smoke off and on"  Vaping Use   Vaping status: Never Used  Substance and Sexual Activity   Alcohol use: No    Comment: last had fireball last night 09/10/21   Drug use: No   Sexual activity: Yes     Partners: Female  Other Topics Concern   Not on file  Social History Narrative   Not on file   Social Determinants of Health   Financial Resource Strain: High Risk (03/06/2023)   Received from [facility]   Overall Financial Resource Strain (CARDIA)    Difficulty of Paying Living Expenses: Very hard  Food Insecurity: No Food Insecurity (07/24/2023)   Hunger Vital Sign    Worried About Running Out of Food in the Last Year: Never true    Ran Out of Food  in the Last Year: Never true  Transportation Needs: No Transportation Needs (07/24/2023)   PRAPARE - Administrator, Civil Service (Medical): No    Lack of Transportation (Non-Medical): No  Physical Activity: Insufficiently Active (03/06/2023)   [facility], [facility]   Exercise Vital Sign    Days of Exercise per Week: 2 days    Minutes of Exercise per Session: 20 min  Stress: No Stress Concern Present (03/06/2023)   Received from [facility], [facility] - Occupational Stress Questionnaire    Feeling of Stress : Not at all  Social Connections: Moderately Integrated (03/06/2023)   Received from [facility], [facility]   Social Network    How would you rate your social network (family, work, friends)?: Adequate participation with social networks    Allergies:  Allergies  Allergen Reactions   Naltrexone Anaphylaxis   Clindamycin Other (See Comments)    Abdominal pain "twisted stomach"   Erythromycin     Caused a "twisted stomach"   Penicillins Rash    Metabolic Disorder Labs: No results found for: "HGBA1C", "MPG" No results found for: "PROLACTIN" No results found for: "CHOL", "TRIG", "HDL", "CHOLHDL", "VLDL", "LDLCALC" No results found for: "TSH"  Therapeutic Level Labs: No results found for: "LITHIUM" No results found for: "VALPROATE" No results found for: "CBMZ"  Current Medications: Current Outpatient Medications   Medication Sig Dispense Refill   lisdexamfetamine (VYVANSE) 70 MG capsule Take 1 capsule (70 mg total) by mouth daily. 30 capsule 0   lisdexamfetamine (VYVANSE) 70 MG capsule Take 1 capsule (70 mg total) by mouth daily. 30 capsule 0   Ascorbic Acid (VITAMIN C) 1000 MG tablet Take 1,000 mg by mouth daily. With Zinc (20 mg)     aspirin EC 81 MG tablet Take 81 mg by mouth in the morning. Swallow whole.     calcium carbonate (TUMS EX) 750 MG chewable tablet Chew 1 tablet by mouth 2 (two) times daily as needed for indigestion or heartburn.     chlorpheniramine (CHLOR-TRIMETON) 4 MG tablet Take 4 mg by mouth 2 (two) times daily as needed for allergies.     clindamycin-benzoyl peroxide (BENZACLIN) gel Apply 1 Application topically at bedtime.     cyanocobalamin (VITAMIN B12) 1000 MCG tablet Take 1,000 mcg by mouth daily.     dexlansoprazole (DEXILANT) 60 MG capsule Take 60 mg by mouth every morning.      docusate sodium (COLACE) 100 MG capsule Take 1 capsule (100 mg total) by mouth 2 (two) times daily. 10 capsule 0   famotidine (PEPCID) 20 MG tablet Take 20 mg by mouth in the morning and at bedtime.     Homeopathic Products (MUSCLE CRAMP COMPLEX PO) Take 1 capsule by mouth in the morning and at bedtime. Cramp Defense Magnesium for Leg Cramps, Muscle Cramps & Muscle Spasms.     ipratropium (ATROVENT) 0.03 % nasal spray Place 1 spray into both nostrils 2 (two) times daily as needed for rhinitis.     L-Lysine 500 MG CAPS Take 500 mg by mouth in the morning and at bedtime.     lamoTRIgine (LAMICTAL) 200 MG tablet Take 1 tablet (200 mg total) by mouth at bedtime. 30 tablet 0   lisdexamfetamine (VYVANSE) 70 MG capsule Take 1 capsule (70 mg total) by mouth daily. 30 capsule 0   methocarbamol (ROBAXIN) 750 MG tablet Take 1 tablet (750 mg total) by mouth every 8 (eight) hours as  needed for muscle spasms. 30 tablet 0   metoprolol succinate (TOPROL-XL) 100 MG 24 hr tablet Take 100 mg by mouth at bedtime.     Misc  Natural Products (ELDERBERRY IMMUNE COMPLEX PO) Take 1 each by mouth in the morning and at bedtime.     montelukast (SINGULAIR) 10 MG tablet Take 10 mg by mouth at bedtime.     Multiple Vitamins-Minerals (MULTIVITAMIN WITH MINERALS) tablet Take 1 tablet by mouth daily. Multivitamin for Women (needs the iron)     nystatin ointment (MYCOSTATIN) Apply 1 Application topically daily. Apply to outside corner of lips     olopatadine (PATADAY) 0.1 % ophthalmic solution Place 1 drop into both eyes 2 (two) times daily as needed for allergies.     oxyCODONE-acetaminophen (PERCOCET) 5-325 MG tablet Take 1-2 tablets by mouth every 4 (four) hours as needed for severe pain (pain score 7-10). 30 tablet 0   Povidone, PF, (IVIZIA DRY EYES) 0.5 % SOLN Place 1 drop into both eyes as needed (DRY EYES).     PROAIR HFA 108 (90 Base) MCG/ACT inhaler Inhale 1-2 puffs into the lungs every 6 (six) hours as needed for shortness of breath or wheezing.     Probiotic Product (PROBIOTIC GUMMIES PO) Take 1 each by mouth in the morning and at bedtime.     promethazine (PHENERGAN) 25 MG tablet Take 25 mg by mouth every 6 (six) hours as needed for nausea or vomiting.     tadalafil (CIALIS) 20 MG tablet Take 20 mg by mouth at bedtime.     telmisartan (MICARDIS) 80 MG tablet Take 80 mg by mouth daily.     testosterone cypionate (DEPOTESTOTERONE CYPIONATE) 200 MG/ML injection Inject 200 mg into the muscle every 14 (fourteen) days.      No current facility-administered medications for this visit.     Musculoskeletal: Strength & Muscle Tone: na Gait & Station: na Patient leans: N/A  Psychiatric Specialty Exam: Review of Systems  Musculoskeletal:  Positive for back pain.  All other systems reviewed and are negative.   There were no vitals taken for this visit.There is no height or weight on file to calculate BMI.  General Appearance: NA  Eye Contact:  NA  Speech:  Clear and Coherent  Volume:  Normal  Mood:  Euthymic   Affect:  NA  Thought Process:  Goal Directed  Orientation:  Full (Time, Place, and Person)  Thought Content: WDL   Suicidal Thoughts:  No  Homicidal Thoughts:  No  Memory:  Immediate;   Good Recent;   Good Remote;   Fair  Judgement:  Good  Insight:  Fair  Psychomotor Activity:  Decreased  Concentration:  Concentration: Good and Attention Span: Good  Recall:  Good  Fund of Knowledge: Good  Language: Good  Akathisia:  No  Handed:  Right  AIMS (if indicated): not done  Assets:  Communication Skills Desire for Improvement Resilience Social Support Talents/Skills  ADL's:  Intact  Cognition: WNL  Sleep:  Good   Screenings: PHQ2-9    Flowsheet Row Video Visit from 05/06/2022 in [facility] [place] Video Visit from 02/04/2022 in [facility] [place] Video Visit from 08/14/2021 in [facility] at [place] Video Visit from 05/20/2021 in [facility] at [facility] Total Score 0 0 0 0      Flowsheet Row Admission (Discharged) from 07/24/2023 in [facility] ED  from 03/15/2023 in [facility] Video Visit from 05/06/2022 in [facility] at [place]  C-SSRS RISK CATEGORY No Risk No Risk No Risk        Assessment and Plan: This patient is a 67 year old male with a history of bipolar disorder and ADHD.  He continues to do well on his current regimen.  He will continue Lamictal 200 mg daily for mood stabilization and Vyvanse 70 mg every morning for ADHD.  He will return to see me in 3 months  Collaboration of Care: Collaboration of Care: Primary Care Provider AEB notes are shared with PCP on the epic system  Patient/Guardian was advised Release of Information must be obtained prior to any record release in order to collaborate their care with an outside provider.  Patient/Guardian was advised if they have not already done so to contact the registration department to sign all necessary forms in order for [place] to release information regarding their care.   Consent: Patient/Guardian gives verbal consent for treatment and assignment of benefits for services provided during this visit. Patient/Guardian expressed understanding and agreed to proceed.    Diannia Ruder, MD 07/30/2023, 9:51 AM

## 2023-09-29 ENCOUNTER — Other Ambulatory Visit (HOSPITAL_COMMUNITY): Payer: Self-pay | Admitting: Psychiatry

## 2023-10-30 ENCOUNTER — Encounter (HOSPITAL_COMMUNITY): Payer: Self-pay | Admitting: Psychiatry

## 2023-10-30 ENCOUNTER — Telehealth (HOSPITAL_COMMUNITY): Payer: PPO | Admitting: Psychiatry

## 2023-10-30 DIAGNOSIS — F3162 Bipolar disorder, current episode mixed, moderate: Secondary | ICD-10-CM | POA: Diagnosis not present

## 2023-10-30 DIAGNOSIS — F900 Attention-deficit hyperactivity disorder, predominantly inattentive type: Secondary | ICD-10-CM

## 2023-10-30 MED ORDER — AMPHETAMINE-DEXTROAMPHET ER 30 MG PO CP24: 30.0000 mg | ORAL_CAPSULE | ORAL | 0 refills | Status: DC

## 2023-10-30 MED ORDER — LAMOTRIGINE 200 MG PO TABS: 200.0000 mg | ORAL_TABLET | Freq: Every day | ORAL | 2 refills | Status: DC

## 2023-10-30 NOTE — Progress Notes (Signed)
Virtual Visit via Telephone Note  I connected with Philip Gates on 10/30/23 at  9:40 AM EST by telephone and verified that I am speaking with the correct person using two identifiers.  Location: Patient: home Provider: office   I discussed the limitations, risks, security and privacy concerns of performing an evaluation and management service by telephone and the availability of in person appointments. I also discussed with the patient that there may be a patient responsible charge related to this service. The patient expressed understanding and agreed to proceed.      I discussed the assessment and treatment plan with the patient. The patient was provided an opportunity to ask questions and all were answered. The patient agreed with the plan and demonstrated an understanding of the instructions.   The patient was advised to call back or seek an in-person evaluation if the symptoms worsen or if the condition fails to improve as anticipated.  I provided 20 minutes of non-face-to-face time during this encounter.   Diannia Ruder, MD  [facility] MD/PA/NP OP Progress Note  10/30/2023 10:02 AM Philip Gates  MRN:  [id number]  Chief Complaint:  Chief Complaint  Patient presents with   ADD   Manic Behavior   Follow-up   VEL:FYBO patient is a 68 year old married white male who lives with his wife in [place]. 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    The patient returns for follow-up after 3 months regarding his ADHD and mood swings.  Overall he has been doing pretty well.  He is mostly recovered from his back surgery but now may need hip surgery.  He is focusing well with the Vyvanse but claims it is getting too expensive.  He has to pay $100 a month out of pocket.  He spoke to his pharmacist and they gave him suggestions for other medicines.  I explained that the closest would be Adderall XR and he is willing to try this.  He denies  significant anger irritability or mood swings.  He is sleeping well Visit Diagnosis:    ICD-10-CM   1. Attention deficit hyperactivity disorder (ADHD), predominantly inattentive type  F90.0     2. 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   Venous insufficiency     Past Surgical History:  Procedure Laterality Date   BACK SURGERY  12/07/2020   cerebral stent     COLONOSCOPY N/A 01/02/2017   Procedure: COLONOSCOPY;  Surgeon: Malissa Hippo, MD;  [facility]: [facility];  Service: Endoscopy;  Laterality: N/A;   dental implant  09/26/2014   upper and lower dental implant/bridges- permanent   ESOPHAGOGASTRODUODENOSCOPY N/A 01/02/2017   Procedure: ESOPHAGOGASTRODUODENOSCOPY (EGD);  Surgeon: Malissa Hippo, MD;  Location: [facility];  Service: Endoscopy;  Laterality: N/A;  910   EYE SURGERY Left 09/24/2015   Cataract   IR ANGIO INTRA EXTRACRAN SEL INTERNAL CAROTID BILAT MOD SED  01/20/2017   IR ANGIO VERTEBRAL SEL VERTEBRAL UNI L MOD SED  01/20/2017   JOINT REPLACEMENT Right    Knee   JOINT REPLACEMENT Left    Hip   NOSE SURGERY     RADIOLOGY WITH ANESTHESIA N/A 12/29/2014   Procedure: Embolization;  Surgeon: Lisbeth Renshaw, MD;   Location[facility] OR;  Service: Radiology;  Laterality: N/A;   SUBCLAVIAN ANGIOGRAM  07/17/2015   TOTAL HIP ARTHROPLASTY Left 01/03/2013   Procedure: TOTAL HIP ARTHROPLASTY ANTERIOR APPROACH;  Surgeon: Loanne Drilling, MD;  Location: [facility] ORS;  Service: Orthopedics;  Laterality: Left;   TOTAL KNEE ARTHROPLASTY Right 10/06/2014   Procedure: RIGHT TOTAL KNEE ARTHROPLASTY;  Surgeon: Loanne Drilling, MD;  Location: [facility] OR[facility];  Service: Orthopedics;  Laterality: Right;   TRANSFORAMINAL LUMBAR INTERBODY FUSION (TLIF) WITH PEDICLE SCREW FIXATION 1 LEVEL Right 07/24/2023   Procedure: Transforaminal Lumbar Interbody Fusion Lumbar Five-Sacral One;  Surgeon: Lisbeth Renshaw, MD;  Location: [facility] OR;  Service: Neurosurgery;  Laterality: Right;  3C   WISDOM TOOTH EXTRACTION      Family Psychiatric History: See below  Family History:  Family History  Problem Relation Age of Onset   Arrhythmia Mother        Has pacemaker   Hypertension Mother    Heart disease Mother    Mental illness Mother    Varicose Veins Mother    Depression Mother    Colon cancer Father    Cancer Father        Prostate and Colon   Diabetes Father    Hypertension Father    Cancer - Colon Father    Depression Sister     Social History:  Social History   Socioeconomic History   Marital status: Married    Spouse name: Not on file   Number of children: Not on file   Years of education: Not on file   Highest education level: Not on file  Occupational History   Not on file  Tobacco Use   Smoking status: Former    Current packs/day: 0.00    Average packs/day: 0.5 packs/day for 2.4 years (1.2 ttl pk-yrs)    Types: Cigarettes    Start date: 05/23/2015    Quit date: 10/09/2017    Years since quitting: 6.0   Smokeless tobacco: Former    Types: Snuff   Tobacco comments:    Stated "I smoke off and on"  Vaping Use   Vaping status: Never Used  Substance and Sexual Activity   Alcohol use: No    Comment: last had fireball last  night 09/10/21   Drug use: No   Sexual activity: Yes    Partners: Female  Other Topics Concern   Not on file  Social History Narrative   Not on file   Social Drivers of Health   Financial Resource Strain: Medium Risk (10/26/2023)   Received from [facility]   Overall Financial Resource Strain (CARDIA)    Difficulty of Paying Living Expenses: Somewhat hard  Food Insecurity: No Food Insecurity (10/26/2023)   Received from [facility]  Hunger Vital Sign    Worried About Running Out of Food in the Last Year: Never true    Ran Out of Food in the Last Year: Never true  Transportation Needs: No Transportation Needs (10/26/2023)   Received from [facility] - Transportation    Lack of Transportation (Medical): No    Lack of Transportation (Non-Medical): No  Physical Activity: Inactive (10/26/2023)   Received from [facility]   Exercise Vital Sign    Days of Exercise per Week: 0 days    Minutes of Exercise per Session: 20 min  Stress: No Stress Concern Present (10/26/2023)   Received from [facility] - Occupational Stress Questionnaire    Feeling of Stress : Not at all  Social Connections: Patient Declined (10/26/2023)   Received from [facility]   Social Network    How would you rate your social network (family, work, friends)?: Patient declined    Allergies:  Allergies  Allergen Reactions   Naltrexone Anaphylaxis   Clindamycin Other (See Comments)    Abdominal pain "twisted stomach"   Erythromycin     Caused a "twisted stomach"   Penicillins Rash    Metabolic Disorder Labs: No results found for: "HGBA1C", "MPG" No results found for: "PROLACTIN" No results found for: "CHOL", "TRIG", "HDL", "CHOLHDL", "VLDL", "LDLCALC" No results found for: "TSH"  Therapeutic Level Labs: No results found for: "LITHIUM" No results found for: "VALPROATE" No results found for: "CBMZ"  Current Medications: Current Outpatient  Medications  Medication Sig Dispense Refill   amphetamine-dextroamphetamine (ADDERALL XR) 30 MG 24 hr capsule Take 1 capsule (30 mg total) by mouth every morning. 30 capsule 0   Ascorbic Acid (VITAMIN C) 1000 MG tablet Take 1,000 mg by mouth daily. With Zinc (20 mg)     aspirin EC 81 MG tablet Take 81 mg by mouth in the morning. Swallow whole.     calcium carbonate (TUMS EX) 750 MG chewable tablet Chew 1 tablet by mouth 2 (two) times daily as needed for indigestion or heartburn.     chlorpheniramine (CHLOR-TRIMETON) 4 MG tablet Take 4 mg by mouth 2 (two) times daily as needed for allergies.     clindamycin-benzoyl peroxide (BENZACLIN) gel Apply 1 Application topically at bedtime.     cyanocobalamin (VITAMIN B12) 1000 MCG tablet Take 1,000 mcg by mouth daily.     dexlansoprazole (DEXILANT) 60 MG capsule Take 60 mg by mouth every morning.      docusate sodium (COLACE) 100 MG capsule Take 1 capsule (100 mg total) by mouth 2 (two) times daily. 10 capsule 0   famotidine (PEPCID) 20 MG tablet Take 20 mg by mouth in the morning and at bedtime.     Homeopathic Products (MUSCLE CRAMP COMPLEX PO) Take 1 capsule by mouth in the morning and at bedtime. Cramp Defense Magnesium for Leg Cramps, Muscle Cramps & Muscle Spasms.     ipratropium (ATROVENT) 0.03 % nasal spray Place 1 spray into both nostrils 2 (two) times daily as needed for rhinitis.     L-Lysine 500 MG CAPS Take 500 mg by mouth in the morning and at bedtime.     lamoTRIgine (LAMICTAL) 200 MG tablet Take 1 tablet (200 mg total) by mouth at bedtime. 30 tablet 2   methocarbamol (ROBAXIN) 750 MG tablet Take 1 tablet (750 mg total) by mouth every 8 (eight) hours as needed for muscle spasms. 30 tablet 0   metoprolol succinate (TOPROL-XL) 100  MG 24 hr tablet Take 100 mg by mouth at bedtime.     Misc Natural Products (ELDERBERRY IMMUNE COMPLEX PO) Take 1 each by mouth in the morning and at bedtime.     montelukast (SINGULAIR) 10 MG tablet Take 10 mg by  mouth at bedtime.     Multiple Vitamins-Minerals (MULTIVITAMIN WITH MINERALS) tablet Take 1 tablet by mouth daily. Multivitamin for Women (needs the iron)     nystatin ointment (MYCOSTATIN) Apply 1 Application topically daily. Apply to outside corner of lips     olopatadine (PATADAY) 0.1 % ophthalmic solution Place 1 drop into both eyes 2 (two) times daily as needed for allergies.     oxyCODONE-acetaminophen (PERCOCET) 5-325 MG tablet Take 1-2 tablets by mouth every 4 (four) hours as needed for severe pain (pain score 7-10). 30 tablet 0   Povidone, PF, (IVIZIA DRY EYES) 0.5 % SOLN Place 1 drop into both eyes as needed (DRY EYES).     PROAIR HFA 108 (90 Base) MCG/ACT inhaler Inhale 1-2 puffs into the lungs every 6 (six) hours as needed for shortness of breath or wheezing.     Probiotic Product (PROBIOTIC GUMMIES PO) Take 1 each by mouth in the morning and at bedtime.     promethazine (PHENERGAN) 25 MG tablet Take 25 mg by mouth every 6 (six) hours as needed for nausea or vomiting.     tadalafil (CIALIS) 20 MG tablet Take 20 mg by mouth at bedtime.     telmisartan (MICARDIS) 80 MG tablet Take 80 mg by mouth daily.     testosterone cypionate (DEPOTESTOTERONE CYPIONATE) 200 MG/ML injection Inject 200 mg into the muscle every 14 (fourteen) days.      No current facility-administered medications for this visit.     Musculoskeletal: Strength & Muscle Tone: na Gait & Station: na Patient leans: N/A  Psychiatric Specialty Exam: Review of Systems  Musculoskeletal:  Positive for arthralgias.  All other systems reviewed and are negative.   There were no vitals taken for this visit.There is no height or weight on file to calculate BMI.  General Appearance: NA  Eye Contact:  NA  Speech:  Clear and Coherent  Volume:  Normal  Mood:  Euthymic  Affect:  NA  Thought Process:  Goal Directed  Orientation:  Full (Time, Place, and Person)  Thought Content: WDL   Suicidal Thoughts:  No  Homicidal  Thoughts:  No  Memory:  Immediate;   Good Recent;   Good Remote;   Good  Judgement:  Good  Insight:  Good  Psychomotor Activity:  Normal  Concentration:  Concentration: Good and Attention Span: Good  Recall:  Good  Fund of Knowledge: Good  Language: Good  Akathisia:  No  Handed:  Right  AIMS (if indicated): not done  Assets:  Communication Skills Desire for Improvement Resilience Social Support Talents/Skills  ADL's:  Intact  Cognition: WNL  Sleep:  Good   Screenings: PHQ2-9    Flowsheet Row Video Visit from 05/06/2022 in [facility] [place] Video Visit from 02/04/2022 in [facility] [place] Video Visit from 08/14/2021 in [facility] [place] Video Visit from 05/20/2021 in [facility] Total Score 0 0 0 0      Flowsheet Row Admission (Discharged) from 07/24/2023 in [facility] from 03/15/2023 in [facility] Video Visit from 05/06/2022 in  [facility] [place]  C-SSRS RISK CATEGORY No Risk No Risk No Risk        Assessment and Plan: This patient is a 68 year old male with a history of bipolar disorder and ADHD.  He continues to do well on his regimen but the Vyvanse is getting too expensive for him.  We will therefore switch from Vyvanse 70 mg every morning to Adderall XR 30 mg every morning.  He will continue Lamictal 200 mg daily for mood stabilization.  He will return to see me in 4 weeks  Collaboration of Care: Collaboration of Care: Primary Care Provider AEB notes are shared with PCP on the epic system  Patient/Guardian was advised Release of Information must be obtained prior to any record release in order to collaborate their care with an outside provider. Patient/Guardian was advised if they have not already done so to contact  the registration department to sign all necessary forms in order for [place] to release information regarding their care.   Consent: Patient/Guardian gives verbal consent for treatment and assignment of benefits for services provided during this visit. Patient/Guardian expressed understanding and agreed to proceed.    Diannia Ruder, MD 10/30/2023, 10:02 AM

## 2023-11-03 ENCOUNTER — Other Ambulatory Visit (HOSPITAL_COMMUNITY): Payer: Self-pay | Admitting: Psychiatry

## 2023-11-03 ENCOUNTER — Telehealth (HOSPITAL_COMMUNITY): Payer: Self-pay | Admitting: *Deleted

## 2023-11-03 MED ORDER — LISDEXAMFETAMINE DIMESYLATE 70 MG PO CAPS: 70.0000 mg | ORAL_CAPSULE | Freq: Every day | ORAL | 0 refills | Status: DC

## 2023-11-03 NOTE — Telephone Encounter (Signed)
 I can't add another med to the adderall so I will send the vyvanse back in

## 2023-11-03 NOTE — Telephone Encounter (Signed)
 Per pt the adderall is not working as good as the vyvanse. Per pt provider can either add another med to help or he's going to have to go back on the vyvnase again.   Per pt he gets really sleepy and the adderall XR is not working.

## 2023-11-03 NOTE — Telephone Encounter (Signed)
Informed patient and he verbalized understanding

## 2023-11-24 NOTE — H&P (Signed)
 TOTAL HIP ADMISSION H&P  Patient is admitted for right total hip arthroplasty.  Subjective:  Chief Complaint: Right hip pain  HPI: Philip Gates, 68 y.o. male, has a history of pain and functional disability in the right hip due to arthritis and patient has failed non-surgical conservative treatments for greater than 12 weeks to include NSAID's and/or analgesics, corticosteriod injections, and activity modification. Onset of symptoms was abrupt, starting  less than a  year ago with rapidlly worsening course since that time. The patient noted no past surgery on the right hip. Patient currently rates pain in the right hip at 9 out of 10 with activity. Patient has night pain, worsening of pain with activity and weight bearing, and trendelenberg gait. Patient has evidence of  bone-on-bone arthritis in the right hip  by imaging studies. This condition presents safety issues increasing the risk of falls. There is no current active infection.  Patient Active Problem List   Diagnosis Date Noted   Other spondylosis with radiculopathy, lumbar region 07/24/2023   Pseudophakia, both eyes 06/09/2022   Vitreous membranes and strands, right eye 01/02/2022   History of vitrectomy 01/02/2022   Myofascial pain 10/22/2021   Sensorineural hearing loss (SNHL), bilateral 10/10/2021   Tinnitus of both ears 10/10/2021   Alcohol abuse 09/11/2021   Transaminitis 09/11/2021   Obesity (BMI 30.0-34.9) 09/11/2021   COPD (chronic obstructive pulmonary disease) (HCC) 09/11/2021   Chronic back pain 09/11/2021   Altered mental status 09/11/2021   Inflammation of sacroiliac joint (HCC) 09/10/2021   Opiate dependence (HCC) 09/10/2021   Overweight (BMI 25.0-29.9) 08/16/2021   Sacroiliitis (HCC) 07/17/2021   Body mass index (BMI) 30.0-30.9, adult 05/29/2021   Lumbar spondylosis 02/25/2021   Mixed hyperlipidemia 02/15/2021   Asthma 02/13/2021   Elevated blood-pressure reading, without diagnosis of hypertension 12/17/2020    Body mass index (BMI) 31.0-31.9, adult 12/17/2020   Spinal stenosis, 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   Tachycardia 11/16/2020   Gastroesophageal reflux disease without esophagitis 10/15/2020   Bipolar affective disorder in remission (HCC) 10/15/2020   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   Seasonal allergies 10/15/2020   Chronic pain syndrome 10/15/2020   Mixed simple and mucopurulent chronic bronchitis (HCC) 10/15/2020   Male erectile disorder 10/15/2020   History of cerebral aneurysm repair 10/15/2020   Degenerative disc disease, cervical 10/15/2020   Benign prostatic hyperplasia without lower urinary tract symptoms 10/15/2020   Essential hypertension 06/27/2020   Body mass index (BMI) 29.0-29.9, adult 05/22/2020   Pain in joint of left shoulder 09/12/2019   Pain in joint of left elbow 09/12/2019   Pain in thumb joint with movement of left hand 09/12/2019   Osteoarthritis of carpometacarpal (CMC) joint of thumb 09/12/2019   Spinal stenosis of lumbar region without neurogenic claudication 01/19/2019   Prolapsed lumbar disc 01/19/2019   Stenosis of intervertebral foramina 10/04/2018   Cervical radiculitis 10/04/2018   Acute back pain with sciatica 07/19/2018   Lumbago with sciatica, left side 07/19/2018   Family hx of colon cancer 11/13/2016   Absolute anemia 11/13/2016   Varicose veins of right lower extremity with complications 12/10/2015   Bipolar 1 disorder, mixed, moderate (HCC) 10/12/2015   Attention deficit hyperactivity disorder (ADHD), combined type 10/12/2015   Neck pain 09/13/2015   Chronic low back pain 09/13/2015   Chronic venous insufficiency 06/25/2015   SBO (small bowel obstruction) (HCC) 01/16/2015   OA (osteoarthritis) of knee 10/06/2014   Intracranial aneurysm 09/25/2014   Facial cellulitis 05/31/2014   OA (osteoarthritis) of  hip 01/03/2013    Past Medical History:  Diagnosis Date   Acid reflux    ADHD    Aneurysm (HCC)     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   Venous insufficiency     Past Surgical History:  Procedure Laterality Date   BACK SURGERY  12/07/2020   cerebral stent     COLONOSCOPY N/A 01/02/2017   Procedure: COLONOSCOPY;  Surgeon: Malissa Hippo, MD;  Location: [facility];  Service: Endoscopy;  Laterality: N/A;   dental implant  09/26/2014   upper and lower dental implant/bridges- permanent   ESOPHAGOGASTRODUODENOSCOPY N/A 01/02/2017   Procedure: ESOPHAGOGASTRODUODENOSCOPY (EGD);  Surgeon: Malissa Hippo, MD;  Location: [facility];  Service: Endoscopy;  Laterality: N/A;  910   EYE SURGERY Left 09/24/2015   Cataract   IR ANGIO INTRA EXTRACRAN SEL INTERNAL CAROTID BILAT MOD SED  01/20/2017   IR ANGIO VERTEBRAL SEL VERTEBRAL UNI L MOD SED  01/20/2017   JOINT REPLACEMENT Right    Knee   JOINT REPLACEMENT Left    Hip   NOSE SURGERY     RADIOLOGY WITH ANESTHESIA N/A 12/29/2014   Procedure: Embolization;  Surgeon: Lisbeth Renshaw, MD;  Location: [facility] OR;  Service: Radiology;  Laterality: N/A;   SUBCLAVIAN ANGIOGRAM  07/17/2015   TOTAL HIP ARTHROPLASTY Left 01/03/2013   Procedure: TOTAL HIP ARTHROPLASTY ANTERIOR APPROACH;  Surgeon: Loanne Drilling, MD;  Location: [facility];  Service: Orthopedics;  Laterality: Left;   TOTAL KNEE ARTHROPLASTY Right 10/06/2014   Procedure: RIGHT TOTAL KNEE ARTHROPLASTY;  Surgeon: Loanne Drilling, MD;  Location: [facility];   Service: Orthopedics;  Laterality: Right;   TRANSFORAMINAL LUMBAR INTERBODY FUSION (TLIF) WITH PEDICLE SCREW FIXATION 1 LEVEL Right 07/24/2023   Procedure: Transforaminal Lumbar Interbody Fusion Lumbar Five-Sacral One;  Surgeon: Lisbeth Renshaw, MD;  Location: [facility] OR;  Service: Neurosurgery;  Laterality: Right;  3C   WISDOM TOOTH EXTRACTION      Prior to Admission medications   Medication Sig Start Date End Date Taking? Authorizing Provider  Ascorbic Acid (VITAMIN C) 1000 MG tablet Take 1,000 mg by mouth daily. With Zinc (20 mg)    [provider]  aspirin EC 81 MG tablet Take 81 mg by mouth in the morning. Swallow whole.    [provider]  calcium carbonate (TUMS EX) 750 MG chewable tablet Chew 1 tablet by mouth 2 (two) times daily as needed for indigestion or heartburn.    [provider]  chlorpheniramine (CHLOR-TRIMETON) 4 MG tablet Take 4 mg by mouth 2 (two) times daily as needed for allergies.    [provider]  clindamycin-benzoyl peroxide (BENZACLIN) gel Apply 1 Application topically at bedtime. 01/16/23   [provider]  cyanocobalamin (VITAMIN B12) 1000 MCG tablet Take 1,000 mcg by mouth daily.  [provider]  dexlansoprazole (DEXILANT) 60 MG capsule Take 60 mg by mouth every morning.     [provider]  docusate sodium (COLACE) 100 MG capsule Take 1 capsule (100 mg total) by mouth 2 (two) times daily. 07/25/23   Val Eagle D, NP  famotidine (PEPCID) 20 MG tablet Take 20 mg by mouth in the morning and at bedtime. 11/06/20   [provider]  Homeopathic Products (MUSCLE CRAMP COMPLEX PO) Take 1 capsule by mouth in the morning and at bedtime. Cramp Defense Magnesium for Leg Cramps, Muscle Cramps & Muscle Spasms.    [provider]  ipratropium (ATROVENT) 0.03 % nasal spray Place 1 spray into both nostrils 2 (two) times daily as needed for rhinitis. 07/24/21   [provider]  L-Lysine 500  MG CAPS Take 500 mg by mouth in the morning and at bedtime.    [provider]  lamoTRIgine (LAMICTAL) 200 MG tablet Take 1 tablet (200 mg total) by mouth at bedtime. 10/30/23   Myrlene Broker, MD  lisdexamfetamine (VYVANSE) 70 MG capsule Take 1 capsule (70 mg total) by mouth daily. 11/03/23   Myrlene Broker, MD  lisdexamfetamine (VYVANSE) 70 MG capsule Take 1 capsule (70 mg total) by mouth daily. 11/03/23   Myrlene Broker, MD  lisdexamfetamine (VYVANSE) 70 MG capsule Take 1 capsule (70 mg total) by mouth daily. 11/03/23   Myrlene Broker, MD  methocarbamol (ROBAXIN) 750 MG tablet Take 1 tablet (750 mg total) by mouth every 8 (eight) hours as needed for muscle spasms. 07/25/23   Val Eagle D, NP  metoprolol succinate (TOPROL-XL) 100 MG 24 hr tablet Take 100 mg by mouth at bedtime. 11/16/20   [provider]  Misc Natural Products (ELDERBERRY IMMUNE COMPLEX PO) Take 1 each by mouth in the morning and at bedtime.    [provider]  montelukast (SINGULAIR) 10 MG tablet Take 10 mg by mouth at bedtime.    [provider]  Multiple Vitamins-Minerals (MULTIVITAMIN WITH MINERALS) tablet Take 1 tablet by mouth daily. Multivitamin for Women (needs the iron)    [provider]  nystatin ointment (MYCOSTATIN) Apply 1 Application topically daily. Apply to outside corner of lips    [provider]  olopatadine (PATADAY) 0.1 % ophthalmic solution Place 1 drop into both eyes 2 (two) times daily as needed for allergies.    [provider]  oxyCODONE-acetaminophen (PERCOCET) 5-325 MG tablet Take 1-2 tablets by mouth every 4 (four) hours as needed for severe pain (pain score 7-10). 07/25/23 07/24/24  Val Eagle D, NP  Povidone, PF, (IVIZIA DRY EYES) 0.5 % SOLN Place 1 drop into both eyes as needed (DRY EYES).    [provider]  PROAIR HFA 108 [id number] Base) MCG/ACT inhaler Inhale 1-2 puffs into the lungs every 6 (six) hours as needed for  shortness of breath or wheezing. 10/21/17   [provider]  Probiotic Product (PROBIOTIC GUMMIES PO) Take 1 each by mouth in the morning and at bedtime.    [provider]  promethazine (PHENERGAN) 25 MG tablet Take 25 mg by mouth every 6 (six) hours as needed for nausea or vomiting.    [provider]  tadalafil (CIALIS) 20 MG tablet Take 20 mg by mouth at bedtime.    [provider]  telmisartan (MICARDIS) 80 MG tablet Take 80 mg by mouth daily. 03/06/23   [provider]  testosterone cypionate (DEPOTESTOTERONE CYPIONATE) 200 MG/ML injection Inject 200  mg into the muscle every 14 (fourteen) days.  12/16/14   [provider]    Allergies  Allergen Reactions   Naltrexone Anaphylaxis   Clindamycin Other (See Comments)    Abdominal pain "twisted stomach"   Erythromycin     Caused a "twisted stomach"   Penicillins Rash    Social History   Socioeconomic History   Marital status: Married    Spouse name: Not on file   Number of children: Not on file   Years of education: Not on file   Highest education level: Not on file  Occupational History   Not on file  Tobacco Use   Smoking status: Former    Current packs/day: 0.00    Average packs/day: 0.5 packs/day for 2.4 years (1.2 ttl pk-yrs)    Types: Cigarettes    Start date: 05/23/2015    Quit date: 10/09/2017    Years since quitting: 6.1   Smokeless tobacco: Former    Types: Snuff   Tobacco comments:    Stated "I smoke off and on"  Vaping Use   Vaping status: Never Used  Substance and Sexual Activity   Alcohol use: No    Comment: last had fireball last night 09/10/21   Drug use: No   Sexual activity: Yes    Partners: Female  Other Topics Concern   Not on file  Social History Narrative   Not on file   Social Drivers of Health   Financial Resource Strain: Medium Risk (10/26/2023)   Received from [facility]   Overall Financial Resource Strain (CARDIA)    Difficulty of  Paying Living Expenses: Somewhat hard  Food Insecurity: Low Risk  (11/19/2023)   Received from [facility]   Hunger Vital Sign    Worried About Running Out of Food in the Last Year: Never true    Ran Out of Food in the Last Year: Never true  Transportation Needs: No Transportation Needs (11/19/2023)   Received from [facility]    In the past 12 months, has lack of reliable transportation kept you from medical appointments, meetings, work or from getting things needed for daily living? : No  Physical Activity: Inactive (10/26/2023)   [facility] from [facility]   Exercise Vital Sign    Days of Exercise per Week: 0 days    Minutes of Exercise per Session: 20 min  Stress: No Stress Concern Present (10/26/2023)   Received from [facility] - Occupational Stress Questionnaire    Feeling of Stress : Not at all  Social Connections: Patient Declined (10/26/2023)   Received from [facility]   Social Network    How would you rate your social network (family, work, friends)?: Patient declined  Intimate Partner Violence: Not At Risk (10/26/2023)   Received from [facility]   HITS    Over the last 12 months how often did your partner physically hurt you?: Never    Over the last 12 months how often did your partner insult you or talk down to you?: Rarely    Over the last 12 months how often did your partner threaten you with physical harm?: Never    Over the last 12 months how often did your partner scream or curse at you?: Never    Tobacco Use: Medium Risk (11/16/2023)   Received from [facility]   Patient History    Smoking Tobacco Use: Former    Smokeless Tobacco Use: Never  Passive Exposure: Past   Social History   Substance and Sexual Activity  Alcohol Use No   Comment: last had fireball last night 09/10/21    Family History  Problem Relation Age of Onset   Arrhythmia Mother        Has pacemaker   Hypertension  Mother    Heart disease Mother    Mental illness Mother    Varicose Veins Mother    Depression Mother    Colon cancer Father    Cancer Father        Prostate and Colon   Diabetes Father    Hypertension Father    Cancer - Colon Father    Depression Sister     Review of Systems  Constitutional:  Negative for chills and fever.  HENT: Negative.    Eyes: Negative.   Respiratory:  Negative for cough and shortness of breath.   Cardiovascular:  Negative for chest pain and palpitations.  Gastrointestinal:  Negative for abdominal pain, nausea and vomiting.  Genitourinary:  Negative for dysuria, frequency and urgency.  Musculoskeletal:  Positive for joint pain.  Skin:  Negative for rash.   Objective:  Physical Exam: Well nourished and well developed.  General: Alert and oriented x3, cooperative and pleasant, no acute distress.  Head: normocephalic, atraumatic, neck supple.  Eyes: EOMI. Abdomen: non-tender to palpation and soft, normoactive bowel sounds. Musculoskeletal: - Evaluation of his right hip shows flexion to 100 degrees with no internal rotation, approximately 10 to 20 degrees of external rotation, and 20 degrees of abduction. - His gait pattern is significantly antalgic on the right. - Significant pain on any attempted range of motion of the right hip. Calves soft and nontender. Motor function intact in LE. Strength 5/5 LE bilaterally. Neuro: Distal pulses 2+. Sensation to light touch intact in LE.  Vital signs in last 24 hours: BP: ()/()  Arterial Line BP: ()/()   Imaging Review Plain radiographs demonstrate severe degenerative joint disease of the right hip. The bone quality appears to be adequate for age and reported activity level.  Assessment/Plan:  End stage arthritis, right hip  The patient history, physical examination, clinical judgement of the provider and imaging studies are consistent with end stage degenerative joint disease of the right hip and total hip  arthroplasty is deemed medically necessary. The treatment options including medical management, injection therapy, arthroscopy and arthroplasty were discussed at length. The risks and benefits of total hip arthroplasty were presented and reviewed. The risks due to aseptic loosening, infection, stiffness, dislocation/subluxation, thromboembolic complications and other imponderables were discussed. The patient acknowledged the explanation, agreed to proceed with the plan and consent was signed. Patient is being admitted for inpatient treatment for surgery, pain control, PT, OT, prophylactic antibiotics, VTE prophylaxis, progressive ambulation and ADLs and discharge planning.The patient is planning to be discharged  home .  Therapy Plans: HEP Disposition: Home with Wife Planned DVT Prophylaxis: Aspirin 81 mg BID DME Needed: None PCP: [name], NP (clearance received) TXA: IV Allergies: clindamycin (angioedema), erythromycin (N/V), naltrexone (hallucinations), penicillins (rash) Anesthesia Concerns: hx of back surgery BMI: 27.2 Last HgbA1c: not diabetic  Pharmacy: [facility] (deliver to room)  Other: -Takes hydrocodone 10 mg q 4 hrs - discussed dilaudid post-op (did this with knee and back surgery in past)  - Patient was instructed on what medications to stop prior to surgery. - Follow-up visit in 2 weeks with Dr. Lequita Halt - Begin physical therapy following surgery - Pre-operative lab work as pre-surgical testing -  Prescriptions will be provided in hospital at time of discharge  R. Arcola Jansky, PA-C Orthopedic Surgery [facility] [place]

## 2023-11-27 ENCOUNTER — Telehealth (HOSPITAL_COMMUNITY): Payer: PPO | Admitting: Psychiatry

## 2023-12-02 ENCOUNTER — Telehealth (HOSPITAL_COMMUNITY): Admitting: Psychiatry

## 2023-12-03 NOTE — Progress Notes (Signed)
 COVID Vaccine Completed: yes  Date of COVID positive in last 90 days:  PCP - [name], NP Cardiologist -   Medical/cardiac clearance by [name], NP in media tab dated 11/13/23  Chest x-ray -  EKG - 07/17/23 Epic Stress Test -  ECHO -  Cardiac Cath -  Pacemaker/ICD device last checked: Spinal Cord Stimulator:  Bowel Prep -   Sleep Study -  CPAP -   Fasting Blood Sugar -  Checks Blood Sugar _____ times a day  Last dose of GLP1 agonist-  N/A GLP1 instructions:  Hold 7 days before surgery    Last dose of SGLT-2 inhibitors-  N/A SGLT-2 instructions:  Hold 3 days before surgery    Blood Thinner Instructions:  Last dose:   Time: Aspirin Instructions: ASA 81 Last Dose:  Activity level:  Can go up a flight of stairs and perform activities of daily living without stopping and without symptoms of chest pain or shortness of breath.  Able to exercise without symptoms  Unable to go up a flight of stairs without symptoms of     Anesthesia review: HTN, intracranial aneurysm, COPD, asthma,   Patient denies shortness of breath, fever, cough and chest pain at PAT appointment  Patient verbalized understanding of instructions that were given to them at the PAT appointment. Patient was also instructed that they will need to review over the PAT instructions again at home before surgery.

## 2023-12-03 NOTE — Patient Instructions (Signed)
 SURGICAL WAITING ROOM VISITATION  Patients having surgery or a procedure may have no more than 2 support people in the waiting area - these visitors may rotate.    Children under the age of 43 must have an adult with them who is not the patient.  Due to an increase in RSV and influenza rates and associated hospitalizations, children ages 73 and under may not visit patients in [facility] hospitals.  Visitors with respiratory illnesses are discouraged from visiting and should remain at home.  If the patient needs to stay at the hospital during part of their recovery, the visitor guidelines for inpatient rooms apply. Pre-op nurse will coordinate an appropriate time for 1 support person to accompany patient in pre-op.  This support person may not rotate.    Please refer to the [facility] website for the visitor guidelines for Inpatients (after your surgery is over and you are in a regular room).    Your procedure is scheduled on: 12/14/23   Report to [facility] Main Entrance    Report to admitting at 12:20 PM   Call this number if you have problems the morning of surgery [phone]   Do not eat food :After Midnight.   After Midnight you may have the following liquids until 11:50 AM DAY OF SURGERY  Water Non-Citrus Juices (without pulp, NO RED-Apple, White grape, White cranberry) Black Coffee (NO MILK/CREAM OR CREAMERS, sugar ok)  Clear Tea (NO MILK/CREAM OR CREAMERS, sugar ok) regular and decaf                             Plain Jell-O (NO RED)                                           Fruit ices (not with fruit pulp, NO RED)                                     Popsicles (NO RED)                                                               Sports drinks like Gatorade (NO RED)                 The day of surgery:  Drink ONE (1) Pre-Surgery Clear Ensure at 11:50 AM the morning of surgery. Drink in one sitting. Do not sip.  This drink was given to you during your hospital   pre-op appointment visit. Nothing else to drink after completing the  Pre-Surgery Clear Ensure.          If you have questions, please contact your surgeon's office.   FOLLOW BOWEL PREP AND ANY ADDITIONAL PRE OP INSTRUCTIONS YOU RECEIVED FROM YOUR SURGEON'S OFFICE!!!     Oral Hygiene is also important to reduce your risk of infection.                                    Remember -  BRUSH YOUR TEETH THE MORNING OF SURGERY WITH YOUR REGULAR TOOTHPASTE  DENTURES WILL BE REMOVED PRIOR TO SURGERY PLEASE DO NOT APPLY "Poly grip" OR ADHESIVES!!!   Stop all vitamins and herbal supplements 7 days before surgery.   Take these medicines the morning of surgery with A SIP OF WATER: Famotidine, Norco, Inhalers, Phenergan                               You may not have any metal on your body including jewelry, and body piercing             Do not wear lotions, powders, cologne, or deodorant              Men may shave face and neck.   Do not bring valuables to the hospital. [facility] IS NOT             RESPONSIBLE   FOR VALUABLES.   Contacts, glasses, dentures or bridgework may not be worn into surgery.   Bring small overnight bag day of surgery.   DO NOT BRING YOUR HOME MEDICATIONS TO THE HOSPITAL. PHARMACY WILL DISPENSE MEDICATIONS LISTED ON YOUR MEDICATION LIST TO YOU DURING YOUR ADMISSION IN THE HOSPITAL!              Please read over the following fact sheets you were given: IF YOU HAVE QUESTIONS ABOUT YOUR PRE-OP INSTRUCTIONS PLEASE CALL [phone] Contras    If you received a COVID test during your pre-op visit  it is requested that you wear a mask when out in public, stay away from anyone that may not be feeling well and notify your surgeon if you develop symptoms. If you test positive for Covid or have been in contact with anyone that has tested positive in the last 10 days please notify you surgeon.      Pre-operative 5 CHG Bath Instructions   You can play a key role in  reducing the risk of infection after surgery. Your skin needs to be as free of germs as possible. You can reduce the number of germs on your skin by washing with CHG (chlorhexidine gluconate) soap before surgery. CHG is an antiseptic soap that kills germs and continues to kill germs even after washing.   DO NOT use if you have an allergy to chlorhexidine/CHG or antibacterial soaps. If your skin becomes reddened or irritated, stop using the CHG and notify one of our RNs at [phone].   Please shower with the CHG soap starting 4 days before surgery using the following schedule:     Please keep in mind the following:  DO NOT shave, including legs and underarms, starting the day of your first shower.   You may shave your face at any point before/day of surgery.  Place clean sheets on your bed the day you start using CHG soap. Use a clean washcloth (not used since being washed) for each shower. DO NOT sleep with pets once you start using the CHG.   CHG Shower Instructions:  If you choose to wash your hair and private area, wash first with your normal shampoo/soap.  After you use shampoo/soap, rinse your hair and body thoroughly to remove shampoo/soap residue.  Turn the water OFF and apply about 3 tablespoons (45 ml) of CHG soap to a CLEAN washcloth.  Apply CHG soap ONLY FROM YOUR NECK DOWN TO YOUR TOES (washing for 3-5 minutes)  DO NOT use  CHG soap on face, private areas, open wounds, or sores.  Pay special attention to the area where your surgery is being performed.  If you are having back surgery, having someone wash your back for you may be helpful. Wait 2 minutes after CHG soap is applied, then you may rinse off the CHG soap.  Pat dry with a clean towel  Put on clean clothes/pajamas   If you choose to wear lotion, please use ONLY the CHG-compatible lotions on the back of this paper.     Additional instructions for the day of surgery: DO NOT APPLY any lotions, deodorants, cologne, or  perfumes.   Put on clean/comfortable clothes.  Brush your teeth.  Ask your nurse before applying any prescription medications to the skin.      CHG Compatible Lotions   Aveeno Moisturizing lotion  Cetaphil Moisturizing Cream  Cetaphil Moisturizing Lotion  Clairol Herbal Essence Moisturizing Lotion, Dry Skin  Clairol Herbal Essence Moisturizing Lotion, Extra Dry Skin  Clairol Herbal Essence Moisturizing Lotion, Normal Skin  Curel Age Defying Therapeutic Moisturizing Lotion with Alpha Hydroxy  Curel Extreme Care Body Lotion  Curel Soothing Hands Moisturizing Hand Lotion  Curel Therapeutic Moisturizing Cream, Fragrance-Free  Curel Therapeutic Moisturizing Lotion, Fragrance-Free  Curel Therapeutic Moisturizing Lotion, Original Formula  Eucerin Daily Replenishing Lotion  Eucerin Dry Skin Therapy Plus Alpha Hydroxy Crme  Eucerin Dry Skin Therapy Plus Alpha Hydroxy Lotion  Eucerin Original Crme  Eucerin Original Lotion  Eucerin Plus Crme Eucerin Plus Lotion  Eucerin TriLipid Replenishing Lotion  Keri Anti-Bacterial Hand Lotion  Keri Deep Conditioning Original Lotion Dry Skin Formula Softly Scented  Keri Deep Conditioning Original Lotion, Fragrance Free Sensitive Skin Formula  Keri Lotion Fast Absorbing Fragrance Free Sensitive Skin Formula  Keri Lotion Fast Absorbing Softly Scented Dry Skin Formula  Keri Original Lotion  Keri Skin Renewal Lotion Keri Silky Smooth Lotion  Keri Silky Smooth Sensitive Skin Lotion  Nivea Body Creamy Conditioning Oil  Nivea Body Extra Enriched Teacher, adult education Moisturizing Lotion Nivea Crme  Nivea Skin Firming Lotion  NutraDerm 30 Skin Lotion  NutraDerm Skin Lotion  NutraDerm Therapeutic Skin Cream  NutraDerm Therapeutic Skin Lotion  ProShield Protective Hand Cream  Provon moisturizing lotion   View Pre-Surgery Education  Videos:  [email or web address]    WHAT IS A BLOOD TRANSFUSION? Blood Transfusion Information  A transfusion is the replacement of blood or some of its parts. Blood is made up of multiple cells which provide different functions. Red blood cells carry oxygen and are used for blood loss replacement. White blood cells fight against infection. Platelets control bleeding. Plasma helps clot blood. Other blood products are available for specialized needs, such as hemophilia or other clotting disorders. BEFORE THE TRANSFUSION  Who gives blood for transfusions?  Healthy volunteers who are fully evaluated to make sure their blood is safe. This is blood bank blood. Transfusion therapy is the safest it has ever been in the practice of medicine. Before blood is taken from a donor, a complete history is taken to make sure that person has no history of diseases nor engages in risky social behavior (examples are intravenous drug use or sexual activity with multiple partners). The donor's travel history is screened to minimize risk of transmitting infections, such as malaria. The donated blood is tested for signs of infectious diseases, such as HIV and hepatitis. The blood is then tested to be sure it is compatible with you in order to minimize the  chance of a transfusion reaction. If you or a relative donates blood, this is often done in anticipation of surgery and is not appropriate for emergency situations. It takes many days to process the donated blood. RISKS AND COMPLICATIONS Although transfusion therapy is very safe and saves many lives, the main dangers of transfusion include:  Getting an infectious disease. Developing a transfusion reaction. This is an allergic reaction to something in the blood you were given. Every precaution is taken to prevent this. The decision to have a blood transfusion has been considered carefully by your caregiver before  blood is given. Blood is not given unless the benefits outweigh the risks. AFTER THE TRANSFUSION Right after receiving a blood transfusion, you will usually feel much better and more energetic. This is especially true if your red blood cells have gotten low (anemic). The transfusion raises the level of the red blood cells which carry oxygen, and this usually causes an energy increase. The nurse administering the transfusion will monitor you carefully for complications. HOME CARE INSTRUCTIONS  No special instructions are needed after a transfusion. You may find your energy is better. Speak with your caregiver about any limitations on activity for underlying diseases you may have. SEEK MEDICAL CARE IF:  Your condition is not improving after your transfusion. You develop redness or irritation at the intravenous (IV) site. SEEK IMMEDIATE MEDICAL CARE IF:  Any of the following symptoms occur over the next 12 hours: Shaking chills. You have a temperature by mouth above 102 F (38.9 C), not controlled by medicine. Chest, back, or muscle pain. People around you feel you are not acting correctly or are confused. Shortness of breath or difficulty breathing. Dizziness and fainting. You get a rash or develop hives. You have a decrease in urine output. Your urine turns a dark color or changes to pink, red, or brown. Any of the following symptoms occur over the next 10 days: You have a temperature by mouth above 102 F (38.9 C), not controlled by medicine. Shortness of breath. Weakness after normal activity. The white part of the eye turns yellow (jaundice). You have a decrease in the amount of urine or are urinating less often. Your urine turns a dark color or changes to pink, red, or brown. Document Released: 08/22/2000 Document Revised: 11/17/2011 Document Reviewed: 04/10/2008 ExitCare Patient Information 2014 [place],  [place].  _______________________________________________________________________  Incentive Spirometer  An incentive spirometer is a tool that can help keep your lungs clear and active. This tool measures how well you are filling your lungs with each breath. Taking long deep breaths may help reverse or decrease the chance of developing breathing (pulmonary) problems (especially infection) following: A long period of time when you are unable to move or be active. BEFORE THE PROCEDURE  If the spirometer includes an indicator to show your best effort, your nurse or respiratory therapist will set it to a desired goal. If possible, sit up straight or lean slightly forward. Try not to slouch. Hold the incentive spirometer in an upright position. INSTRUCTIONS FOR USE  Sit on the edge of your bed if possible, or sit up as far as you can in bed or on a chair. Hold the incentive spirometer in an upright position. Breathe out normally. Place the mouthpiece in your mouth and seal your lips tightly around it. Breathe in slowly and as deeply as possible, raising the piston or the ball toward the top of the column. Hold your breath for 3-5 seconds or for as long  as possible. Allow the piston or ball to fall to the bottom of the column. Remove the mouthpiece from your mouth and breathe out normally. Rest for a few seconds and repeat Steps 1 through 7 at least 10 times every 1-2 hours when you are awake. Take your time and take a few normal breaths between deep breaths. The spirometer may include an indicator to show your best effort. Use the indicator as a goal to work toward during each repetition. After each set of 10 deep breaths, practice coughing to be sure your lungs are clear. If you have an incision (the cut made at the time of surgery), support your incision when coughing by placing a pillow or rolled up towels firmly against it. Once you are able to get out of bed, walk around indoors and cough well.  You may stop using the incentive spirometer when instructed by your caregiver.  RISKS AND COMPLICATIONS Take your time so you do not get dizzy or light-headed. If you are in pain, you may need to take or ask for pain medication before doing incentive spirometry. It is harder to take a deep breath if you are having pain. AFTER USE Rest and breathe slowly and easily. It can be helpful to keep track of a log of your progress. Your caregiver can provide you with a simple table to help with this. If you are using the spirometer at home, follow these instructions: SEEK MEDICAL CARE IF:  You are having difficultly using the spirometer. You have trouble using the spirometer as often as instructed. Your pain medication is not giving enough relief while using the spirometer. You develop fever of 100.5 F (38.1 C) or higher. SEEK IMMEDIATE MEDICAL CARE IF:  You cough up bloody sputum that had not been present before. You develop fever of 102 F (38.9 C) or greater. You develop worsening pain at or near the incision site. MAKE SURE YOU:  Understand these instructions. Will watch your condition. Will get help right away if you are not doing well or get worse. Document Released: 01/05/2007 Document Revised: 11/17/2011 Document Reviewed: 03/08/2007 [facility] Patient Information 2014 [place], [place].   ________________________________________________________________________

## 2023-12-04 ENCOUNTER — Other Ambulatory Visit: Payer: Self-pay

## 2023-12-04 ENCOUNTER — Encounter (HOSPITAL_COMMUNITY)
Admission: RE | Admit: 2023-12-04 | Discharge: 2023-12-04 | Disposition: A | Discharge status: Home / Self Care | Source: Ambulatory Visit | Attending: Orthopedic Surgery | Admitting: Orthopedic Surgery

## 2023-12-04 ENCOUNTER — Encounter (HOSPITAL_COMMUNITY): Payer: Self-pay

## 2023-12-04 VITALS — BP 149/91 | HR 94 | Temp 98.5°F | Resp 16 | Ht 71.0 in | Wt 197.0 lb

## 2023-12-04 DIAGNOSIS — F1120 Opioid dependence, uncomplicated: Secondary | ICD-10-CM | POA: Insufficient documentation

## 2023-12-04 DIAGNOSIS — J4489 Other specified chronic obstructive pulmonary disease: Secondary | ICD-10-CM | POA: Insufficient documentation

## 2023-12-04 DIAGNOSIS — Z87891 Personal history of nicotine dependence: Secondary | ICD-10-CM | POA: Insufficient documentation

## 2023-12-04 DIAGNOSIS — G8929 Other chronic pain: Secondary | ICD-10-CM | POA: Insufficient documentation

## 2023-12-04 DIAGNOSIS — K219 Gastro-esophageal reflux disease without esophagitis: Secondary | ICD-10-CM | POA: Diagnosis not present

## 2023-12-04 DIAGNOSIS — Z01812 Encounter for preprocedural laboratory examination: Secondary | ICD-10-CM | POA: Diagnosis present

## 2023-12-04 DIAGNOSIS — M1611 Unilateral primary osteoarthritis, right hip: Secondary | ICD-10-CM | POA: Insufficient documentation

## 2023-12-04 DIAGNOSIS — F319 Bipolar disorder, unspecified: Secondary | ICD-10-CM | POA: Diagnosis not present

## 2023-12-04 DIAGNOSIS — Z981 Arthrodesis status: Secondary | ICD-10-CM | POA: Diagnosis not present

## 2023-12-04 DIAGNOSIS — Z01818 Encounter for other preprocedural examination: Secondary | ICD-10-CM

## 2023-12-04 DIAGNOSIS — I10 Essential (primary) hypertension: Secondary | ICD-10-CM | POA: Insufficient documentation

## 2023-12-04 HISTORY — DX: Malignant (primary) neoplasm, unspecified: C80.1

## 2023-12-04 HISTORY — DX: Pneumonia, unspecified organism: J18.9

## 2023-12-04 LAB — SURGICAL PCR SCREEN
MRSA, PCR: NEGATIVE
Staphylococcus aureus: NEGATIVE

## 2023-12-04 LAB — BASIC METABOLIC PANEL WITH GFR
Anion gap: 9 (ref 5–15)
BUN: 13 mg/dL (ref 8–23)
CO2: 23 mmol/L (ref 22–32)
Calcium: 9.8 mg/dL (ref 8.9–10.3)
Chloride: 101 mmol/L (ref 98–111)
Creatinine, Ser: 1.09 mg/dL (ref 0.61–1.24)
GFR, Estimated: >60 mL/min (ref >60)
Glucose, Bld: 97 mg/dL (ref 70–99)
Potassium: 4 mmol/L (ref 3.5–5.1)
Sodium: 133 mmol/L — ABNORMAL LOW (ref 135–145)

## 2023-12-04 LAB — CBC
HCT: 44.1 % (ref 39.0–52.0)
Hemoglobin: 15.2 g/dL (ref 13.0–17.0)
MCH: 31.8 pg (ref 26.0–34.0)
MCHC: 34.5 g/dL (ref 30.0–36.0)
MCV: 92.3 fL (ref 80.0–100.0)
Platelets: 271 10*3/uL (ref 150–400)
RBC: 4.78 MIL/uL (ref 4.22–5.81)
RDW: 12.8 % (ref 11.5–15.5)
WBC: 9.3 10*3/uL (ref 4.0–10.5)
nRBC: 0 % (ref 0.0–0.2)

## 2023-12-07 ENCOUNTER — Encounter (HOSPITAL_COMMUNITY): Payer: Self-pay

## 2023-12-07 ENCOUNTER — Encounter (HOSPITAL_COMMUNITY): Payer: Self-pay | Admitting: Psychiatry

## 2023-12-07 ENCOUNTER — Telehealth (INDEPENDENT_AMBULATORY_CARE_PROVIDER_SITE_OTHER): Admitting: Psychiatry

## 2023-12-07 DIAGNOSIS — F3162 Bipolar disorder, current episode mixed, moderate: Secondary | ICD-10-CM | POA: Diagnosis not present

## 2023-12-07 DIAGNOSIS — F900 Attention-deficit hyperactivity disorder, predominantly inattentive type: Secondary | ICD-10-CM | POA: Diagnosis not present

## 2023-12-07 MED ORDER — LISDEXAMFETAMINE DIMESYLATE 70 MG PO CAPS: 70.0000 mg | ORAL_CAPSULE | Freq: Every day | ORAL | 0 refills | Status: DC

## 2023-12-07 MED ORDER — LAMOTRIGINE 200 MG PO TABS: 200.0000 mg | ORAL_TABLET | Freq: Every day | ORAL | 2 refills | Status: DC

## 2023-12-07 NOTE — Anesthesia Preprocedure Evaluation (Addendum)
 Anesthesia Evaluation  Patient identified by MRN, date of birth, ID band Patient awake    Reviewed: Allergy & Precautions, NPO status , Patient's Chart, lab work & pertinent test results, reviewed documented beta blocker date and time   History of Anesthesia Complications Negative for: history of anesthetic complications  Airway Mallampati: II  TM Distance: >3 FB     Dental  (+) Implants   Pulmonary shortness of breath, asthma , neg sleep apnea, pneumonia, COPD, neg recent URI, former smoker   breath sounds clear to auscultation       Cardiovascular hypertension, (-) CAD, (-) Past MI, (-) Cardiac Stents and (-) CABG  Rhythm:Regular Rate:Normal     Neuro/Psych neg Seizures PSYCHIATRIC DISORDERS   Bipolar Disorder    Neuromuscular disease    GI/Hepatic ,GERD  ,,(+) neg Cirrhosis        Endo/Other    Renal/GU Renal disease     Musculoskeletal  (+) Arthritis ,    Abdominal   Peds  Hematology  (+) Blood dyscrasia, anemia   Anesthesia Other Findings   Reproductive/Obstetrics                             Anesthesia Physical Anesthesia Plan  ASA: 3  Anesthesia Plan: Spinal   Post-op Pain Management:    Induction:   PONV Risk Score and Plan: 1 and Ondansetron  Airway Management Planned: Natural Airway and Simple Face Mask  Additional Equipment:   Intra-op Plan:   Post-operative Plan:   Informed Consent: I have reviewed the patients History and Physical, chart, labs and discussed the procedure including the risks, benefits and alternatives for the proposed anesthesia with the patient or authorized representative who has indicated his/her understanding and acceptance.     Dental advisory given  Plan Discussed with: CRNA  Anesthesia Plan Comments: (See PAT note from 3/28)        Anesthesia Quick Evaluation

## 2023-12-07 NOTE — Progress Notes (Signed)
 Case: [id number] Date/Time: 12/14/23 1435   Procedure: ARTHROPLASTY, HIP, TOTAL, ANTERIOR APPROACH (Right: Hip)   Anesthesia type: Choice   Pre-op diagnosis: Right Hip Osteoarthritis   [facility] ROOM 10 / [facility]   Surgeons: Ollen Gross, MD       DISCUSSION: Philip Gates is a 68 year old male who presents to PAT prior to surgery above.  Past medical history significant for former smoking, COPD, asthma, intracranial aneurysm s/p embolization (2016), GERD, arthritis, bipolar disorder, s/p lumbar fusion L3-L4 (12/2020), L5-S1 (07/24/2023), chronic pain with narcotic dependence.  Prior anesthesia complications include difficult intubation (Pt has limited ROM; Full set of front teeth "cemented in"). Glidescope used without difficulty during lumbar surgery in Nov 2024.  Patient seen by PCP on 11/16/2023 due to chronic sinusitis.  Last antibiotic use for this was in Jan 2025. Uses inhalers for COPD and decongestants. Medical clearance received that patient is low risk (scanned in media on 11/13/2023)  VS: BP (!) 149/91   Pulse 94   Temp 36.9 C (Oral)   Resp 16   Ht 5\' 11"  (1.803 m)   Wt 89.4 kg   SpO2 97%   BMI 27.48 kg/m   PROVIDERS: [name], NP   LABS: Labs reviewed: Acceptable for surgery. 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   Venous insufficiency     Past Surgical History:  Procedure Laterality Date   BACK SURGERY  12/07/2020   cerebral stent     COLONOSCOPY N/A 01/02/2017   Procedure: COLONOSCOPY;  Surgeon: Malissa Hippo, MD;  [facility];  Service: Endoscopy;  Laterality: N/A;   dental implant  09/26/2014   upper and lower dental implant/bridges- permanent   ESOPHAGOGASTRODUODENOSCOPY N/A 01/02/2017   Procedure: ESOPHAGOGASTRODUODENOSCOPY (EGD);  Surgeon: Malissa Hippo, MD;  [facility];  Service: Endoscopy;  Laterality: N/A;  910   EYE SURGERY Left 09/24/2015   Cataract   IR ANGIO INTRA EXTRACRAN SEL INTERNAL CAROTID BILAT MOD SED  01/20/2017   IR ANGIO VERTEBRAL SEL VERTEBRAL UNI L MOD SED  01/20/2017   JOINT REPLACEMENT Right    Knee   JOINT REPLACEMENT Left    Hip   NOSE SURGERY  RADIOLOGY WITH ANESTHESIA N/A 12/29/2014   Procedure: Embolization;  Surgeon: Lisbeth Renshaw, MD;  Location: [facility] OR;  Service: Radiology;  Laterality: N/A;   SUBCLAVIAN ANGIOGRAM  07/17/2015   TOTAL HIP ARTHROPLASTY Left 01/03/2013   Procedure: TOTAL HIP ARTHROPLASTY ANTERIOR APPROACH;  Surgeon: Loanne Drilling, MD;  Location: [facility];  Service: Orthopedics;  Laterality: Left;   TOTAL KNEE ARTHROPLASTY Right 10/06/2014   Procedure: RIGHT TOTAL KNEE ARTHROPLASTY;  Surgeon: Loanne Drilling, MD;  Location: [facility];  Service: Orthopedics;  Laterality: Right;   TRANSFORAMINAL LUMBAR INTERBODY FUSION (TLIF) WITH PEDICLE SCREW FIXATION 1 LEVEL Right 07/24/2023   Procedure: Transforaminal Lumbar Interbody Fusion Lumbar Five-Sacral One;  Surgeon:  Lisbeth Renshaw, MD;  Location: [facility] OR;  Service: Neurosurgery;  Laterality: Right;  3C   WISDOM TOOTH EXTRACTION      MEDICATIONS:  Ascorbic Acid (VITAMIN C) 1000 MG tablet   aspirin EC 81 MG tablet   calcium carbonate (TUMS EX) 750 MG chewable tablet   chlorpheniramine (CHLOR-TRIMETON) 4 MG tablet   clindamycin-benzoyl peroxide (BENZACLIN) gel   cyanocobalamin (VITAMIN B12) 1000 MCG tablet   ELDERBERRY/VITAMIN C/ZINC PO   famotidine (PEPCID) 20 MG tablet   HYDROcodone-acetaminophen (NORCO) 10-325 MG tablet   ibuprofen (ADVIL) 800 MG tablet   ipratropium (ATROVENT) 0.03 % nasal spray   lamoTRIgine (LAMICTAL) 200 MG tablet   lansoprazole (PREVACID) 30 MG capsule   lisdexamfetamine (VYVANSE) 70 MG capsule   lisdexamfetamine (VYVANSE) 70 MG capsule   lisdexamfetamine (VYVANSE) 70 MG capsule   magic mouthwash (nystatin, hydrocortisone, diphenhydrAMINE, lidocaine) suspension   methocarbamol (ROBAXIN) 750 MG tablet   metoprolol succinate (TOPROL-XL) 100 MG 24 hr tablet   montelukast (SINGULAIR) 10 MG tablet   Multiple Vitamins-Minerals (MULTIVITAMIN WITH MINERALS) tablet   nystatin ointment (MYCOSTATIN)   olopatadine (PATADAY) 0.1 % ophthalmic solution   OVER THE COUNTER MEDICATION   Povidone, PF, (IVIZIA DRY EYES) 0.5 % SOLN   PROAIR HFA 108 (90 Base) MCG/ACT inhaler   Probiotic Product (PROBIOTIC GUMMIES PO)   promethazine (PHENERGAN) 25 MG tablet   tadalafil (CIALIS) 20 MG tablet   telmisartan (MICARDIS) 80 MG tablet   testosterone cypionate (DEPOTESTOTERONE CYPIONATE) 200 MG/ML injection   VITAMIN D PO   No current facility-administered medications for this encounter.   Marcille Blanco [facility] Short Stay/Anesthesiology [facility] Phone [phone] 12/07/2023 11:27 AM

## 2023-12-07 NOTE — Progress Notes (Signed)
 Virtual Visit via Telephone Note  I connected with Philip Gates on 12/07/23 at 11:00 AM EDT by telephone and verified that I am speaking with the correct person using two identifiers.  Location: Patient: home Provider: office   I discussed the limitations, risks, security and privacy concerns of performing an evaluation and management service by telephone and the availability of in person appointments. I also discussed with the patient that there may be a patient responsible charge related to this service. The patient expressed understanding and agreed to proceed.     I discussed the assessment and treatment plan with the patient. The patient was provided an opportunity to ask questions and all were answered. The patient agreed with the plan and demonstrated an understanding of the instructions.   The patient was advised to call back or seek an in-person evaluation if the symptoms worsen or if the condition fails to improve as anticipated.  I provided 20 minutes of non-face-to-face time during this encounter.   Diannia Ruder, MD  [facility] MD/PA/NP OP Progress Note  12/07/2023 11:17 AM Philip Gates  MRN:  [id number]  Chief Complaint:  Chief Complaint  Patient presents with   ADHD   Manic Behavior   Follow-up   HPI: I:This patient is a 68 year old married white male who lives with his wife in [place]. 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  Last time we tried to change his Vyvanse to Adderall because the Vyvanse was starting to cause too much.  However he did not focus well with Adderall and called back and now he is back to the Vyvanse 70 mg for his ADD.  It seems to be working well for him.  He denies significant depression or mood swings and the Lamictal seems to be working well for the those symptoms.  He is going to be having right hip surgery  next week and he is really looking forward to getting out of this current painful condition.  Visit Diagnosis:    ICD-10-CM   1. Attention deficit hyperactivity disorder (ADHD), predominantly inattentive type  F90.0     2. 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   Venous insufficiency     Past Surgical History:  Procedure Laterality Date   BACK SURGERY  12/07/2020   cerebral stent     COLONOSCOPY N/A  01/02/2017   Procedure: COLONOSCOPY;  Surgeon: Malissa Hippo, MD;  [facility]: [facility];  Service: Endoscopy;  Laterality: N/A;   dental implant  09/26/2014   upper and lower dental implant/bridges- permanent   ESOPHAGOGASTRODUODENOSCOPY N/A 01/02/2017   Procedure: ESOPHAGOGASTRODUODENOSCOPY (EGD);  Surgeon: Malissa Hippo, MD;  Location: [facility];  Service: Endoscopy;  Laterality: N/A;  910   EYE SURGERY Left 09/24/2015   Cataract   IR ANGIO INTRA EXTRACRAN SEL INTERNAL CAROTID BILAT MOD SED  01/20/2017   IR ANGIO VERTEBRAL SEL VERTEBRAL UNI L MOD SED  01/20/2017   JOINT REPLACEMENT Right    Knee   JOINT REPLACEMENT Left    Hip   NOSE SURGERY     RADIOLOGY WITH ANESTHESIA N/A  12/29/2014   Procedure: Embolization;  Surgeon: Lisbeth Renshaw, MD;  Location: [facility] OR;  Service: Radiology;  Laterality: N/A;   SUBCLAVIAN ANGIOGRAM  07/17/2015   TOTAL HIP ARTHROPLASTY Left 01/03/2013   Procedure: TOTAL HIP ARTHROPLASTY ANTERIOR APPROACH;  Surgeon: Loanne Drilling, MD;  Location: [facility] ORS;  Service: Orthopedics;  Laterality: Left;   TOTAL KNEE ARTHROPLASTY Right 10/06/2014   Procedure: RIGHT TOTAL KNEE ARTHROPLASTY;  Surgeon: Loanne Drilling, MD;  Location: [facility];  Service: Orthopedics;  Laterality: Right;   TRANSFORAMINAL LUMBAR INTERBODY FUSION (TLIF) WITH PEDICLE SCREW FIXATION 1 LEVEL Right 07/24/2023   Procedure: Transforaminal Lumbar Interbody Fusion Lumbar Five-Sacral One;  Surgeon: Lisbeth Renshaw, MD;  Location: [facility] OR;  Service: Neurosurgery;  Laterality: Right;  3C   WISDOM TOOTH EXTRACTION      Family Psychiatric History: See below  Family History:  Family History  Problem Relation Age of Onset   Arrhythmia Mother        Has pacemaker   Hypertension Mother    Heart disease Mother    Mental illness Mother    Varicose Veins Mother    Depression Mother    Colon cancer Father    Cancer Father        Prostate and Colon   Diabetes Father    Hypertension Father    Cancer - Colon Father    Depression Sister     Social History:  Social History   Socioeconomic History   Marital status: Married    Spouse name: Not on file   Number of children: Not on file   Years of education: Not on file   Highest education level: Not on file  Occupational History   Not on file  Tobacco Use   Smoking status: Former    Current packs/day: 0.00    Average packs/day: 0.5 packs/day for 2.4 years (1.2 ttl pk-yrs)    Types: Cigarettes    Start date: 05/23/2015    Quit date: 10/09/2017    Years since quitting: 6.1   Smokeless tobacco: Former    Types: Snuff   Tobacco comments:    Stated "I smoke off and on"  Vaping Use   Vaping status: Never Used  Substance and  Sexual Activity   Alcohol use: No    Comment: last had fireball last night 09/10/21   Drug use: No   Sexual activity: Yes    Partners: Female  Other Topics Concern   Not on file  Social History Narrative   Not on file   Social Drivers of Health   Financial Resource Strain: Medium Risk (10/26/2023)   Received from [facility]   Overall Financial Resource Strain (CARDIA)    Difficulty of Paying Living Expenses:  Somewhat hard  Food Insecurity: Low Risk  (11/19/2023)   Received from [facility]   Hunger Vital Sign    Worried About Running Out of Food in the Last Year: Never true    Ran Out of Food in the Last Year: Never true  Transportation Needs: No Transportation Needs (11/19/2023)   Received from [facility]    In the past 12 months, has lack of reliable transportation kept you from medical appointments, meetings, work or from getting things needed for daily living? : No  Physical Activity: Inactive (10/26/2023)   Received from [facility]   Exercise Vital Sign    Days of Exercise per Week: 0 days    Minutes of Exercise per Session: 20 min  Stress: No Stress Concern Present (10/26/2023)   Received from [facility] - Occupational Stress Questionnaire    Feeling of Stress : Not at all  Social Connections: Patient Declined (10/26/2023)   Received from [facility]   Social Network    How would you rate your social network (family, work, friends)?: Patient declined    Allergies:  Allergies  Allergen Reactions   Naltrexone Anaphylaxis   Clindamycin Other (See Comments)    Abdominal pain "twisted stomach"   Erythromycin     Caused a "twisted stomach"   Penicillins Rash    Metabolic Disorder Labs: No results found for: "HGBA1C", "MPG" No results found for: "PROLACTIN" No results found for: "CHOL", "TRIG", "HDL", "CHOLHDL", "VLDL", "LDLCALC" No results found for: "TSH"  Therapeutic Level Labs: No results  found for: "LITHIUM" No results found for: "VALPROATE" No results found for: "CBMZ"  Current Medications: Current Outpatient Medications  Medication Sig Dispense Refill   Ascorbic Acid (VITAMIN C) 1000 MG tablet Take 1,000 mg by mouth 2 (two) times daily. With Zinc (20 mg)     aspirin EC 81 MG tablet Take 81 mg by mouth in the morning. Swallow whole.     calcium carbonate (TUMS EX) 750 MG chewable tablet Chew 1 tablet by mouth 2 (two) times daily as needed for indigestion or heartburn.     chlorpheniramine (CHLOR-TRIMETON) 4 MG tablet Take 4 mg by mouth 2 (two) times daily as needed for allergies.     clindamycin-benzoyl peroxide (BENZACLIN) gel Apply 1 Application topically at bedtime.     cyanocobalamin (VITAMIN B12) 1000 MCG tablet Take 1,000 mcg by mouth daily.     ELDERBERRY/VITAMIN C/ZINC PO Take 1-2 tablets by mouth daily.     famotidine (PEPCID) 20 MG tablet Take 20 mg by mouth in the morning and at bedtime.     HYDROcodone-acetaminophen (NORCO) 10-325 MG tablet Take 1 tablet by mouth every 4 (four) hours as needed for moderate pain (pain score 4-6).     ibuprofen (ADVIL) 800 MG tablet Take 800 mg by mouth every 8 (eight) hours as needed for moderate pain (pain score 4-6).     ipratropium (ATROVENT) 0.03 % nasal spray Place 1 spray into both nostrils 2 (two) times daily as needed for rhinitis.     lamoTRIgine (LAMICTAL) 200 MG tablet Take 1 tablet (200 mg total) by mouth at bedtime. 30 tablet 2   lansoprazole (PREVACID) 30 MG capsule Take 30 mg by mouth 2 (two) times daily.     lisdexamfetamine (VYVANSE) 70 MG capsule Take 1 capsule (70 mg total) by mouth daily. 30 capsule 0   lisdexamfetamine (VYVANSE) 70 MG capsule Take 1 capsule (70 mg total)  by mouth daily. 30 capsule 0   lisdexamfetamine (VYVANSE) 70 MG capsule Take 1 capsule (70 mg total) by mouth daily. 30 capsule 0   magic mouthwash (nystatin, hydrocortisone, diphenhydrAMINE, lidocaine) suspension Swish and swallow 5 mLs daily  as needed for mouth pain (throat pain).     methocarbamol (ROBAXIN) 750 MG tablet Take 1 tablet (750 mg total) by mouth every 8 (eight) hours as needed for muscle spasms. 30 tablet 0   metoprolol succinate (TOPROL-XL) 100 MG 24 hr tablet Take 100 mg by mouth at bedtime.     montelukast (SINGULAIR) 10 MG tablet Take 10 mg by mouth at bedtime.     Multiple Vitamins-Minerals (MULTIVITAMIN WITH MINERALS) tablet Take 1 tablet by mouth daily. Multivitamin for Women (needs the iron)     nystatin ointment (MYCOSTATIN) Apply 1 Application topically daily as needed (irritation). Apply to outside corner of lips     olopatadine (PATADAY) 0.1 % ophthalmic solution Place 1 drop into both eyes 2 (two) times daily as needed for allergies.     OVER THE COUNTER MEDICATION Take 1 tablet by mouth daily. Cramp defence supplement     Povidone, PF, (IVIZIA DRY EYES) 0.5 % SOLN Place 1 drop into both eyes as needed (DRY EYES).     PROAIR HFA 108 (90 Base) MCG/ACT inhaler Inhale 1-2 puffs into the lungs every 6 (six) hours as needed for shortness of breath or wheezing.     Probiotic Product (PROBIOTIC GUMMIES PO) Take 1 each by mouth daily.     promethazine (PHENERGAN) 25 MG tablet Take 25 mg by mouth every 6 (six) hours as needed for nausea or vomiting.     tadalafil (CIALIS) 20 MG tablet Take 20 mg by mouth at bedtime.     telmisartan (MICARDIS) 80 MG tablet Take 80 mg by mouth daily.     testosterone cypionate (DEPOTESTOTERONE CYPIONATE) 200 MG/ML injection Inject 200 mg into the muscle every 14 (fourteen) days.      VITAMIN D PO Take 1 capsule by mouth daily.     No current facility-administered medications for this visit.     Musculoskeletal: Strength & Muscle Tone: na Gait & Station: na Patient leans: N/A  Psychiatric Specialty Exam: Review of Systems  Musculoskeletal:  Positive for arthralgias and gait problem.  All other systems reviewed and are negative.   There were no vitals taken for this  visit.There is no height or weight on file to calculate BMI.  General Appearance: NA  Eye Contact:  NA  Speech:  Clear and Coherent  Volume:  Normal  Mood:  Euthymic  Affect:  NA  Thought Process:  Goal Directed  Orientation:  Full (Time, Place, and Person)  Thought Content: WDL   Suicidal Thoughts:  No  Homicidal Thoughts:  No  Memory:  Immediate;   Good Recent;   Good Remote;   Good  Judgement:  Good  Insight:  Fair  Psychomotor Activity:  Decreased  Concentration:  Concentration: Good and Attention Span: Good  Recall:  Good  Fund of Knowledge: Good  Language: Good  Akathisia:  No  Handed:  Right  AIMS (if indicated): not done  Assets:  Communication Skills Desire for Improvement Resilience Social Support Talents/Skills  ADL's:  Intact  Cognition: WNL  Sleep:  Good   Screenings: PHQ2-9    Flowsheet Row Video Visit from 05/06/2022 in [facility] [place] Video Visit from 02/04/2022 in [facility] at [place] Video Visit  from 08/14/2021 in [facility] [place] Video Visit from 05[facility]20/2021 in [facility] Total Score 0 0 0 0      Flowsheet Row Admission (Discharged) from 07/24/2023 in [facility] ED from 03/15/2023 in [facility] [place] Video Visit from 05/06/2022 in [facility] at [place]  C-SSRS RISK CATEGORY No Risk No Risk No Risk        Assessment and Plan: This patient is a 68 year old male with a history of bipolar disorder and ADHD.  He continues to do well on his current regimen.  He will continue Vyvanse 70 mg every morning for ADHD and Lamictal 200 mg daily for mood stabilization.  He will return to see me in 3 months  Collaboration of Care: Collaboration of Care: Primary Care Provider AEB notes are shared with PCP on the epic  system  Patient/Guardian was advised Release of Information must be obtained prior to any record release in order to collaborate their care with an outside provider. Patient/Guardian was advised if they have not already done so to contact the registration department to sign all necessary forms in order for [place] to release information regarding their care.   Consent: Patient/Guardian gives verbal consent for treatment and assignment of benefits for services provided during this visit. Patient/Guardian expressed understanding and agreed to proceed.    Diannia Ruder, MD 12/07/2023, 11:17 AM

## 2023-12-14 ENCOUNTER — Observation Stay (HOSPITAL_COMMUNITY)
Admission: RE | Admit: 2023-12-14 | Discharge: 2023-12-15 | Disposition: A | Discharge status: Home / Self Care | Source: Ambulatory Visit | Attending: Orthopedic Surgery | Admitting: Orthopedic Surgery

## 2023-12-14 ENCOUNTER — Encounter (HOSPITAL_COMMUNITY)
Admission: RE | Disposition: A | Discharge status: Home / Self Care | Payer: Self-pay | Source: Ambulatory Visit | Attending: Orthopedic Surgery

## 2023-12-14 ENCOUNTER — Other Ambulatory Visit: Payer: Self-pay

## 2023-12-14 ENCOUNTER — Ambulatory Visit (HOSPITAL_COMMUNITY)

## 2023-12-14 ENCOUNTER — Ambulatory Visit (HOSPITAL_COMMUNITY): Payer: Self-pay | Admitting: Physician Assistant

## 2023-12-14 ENCOUNTER — Ambulatory Visit (HOSPITAL_COMMUNITY): Admitting: Certified Registered"

## 2023-12-14 ENCOUNTER — Encounter (HOSPITAL_COMMUNITY): Payer: Self-pay | Admitting: Orthopedic Surgery

## 2023-12-14 DIAGNOSIS — Z85828 Personal history of other malignant neoplasm of skin: Secondary | ICD-10-CM | POA: Diagnosis not present

## 2023-12-14 DIAGNOSIS — Z96642 Presence of left artificial hip joint: Secondary | ICD-10-CM | POA: Diagnosis not present

## 2023-12-14 DIAGNOSIS — I10 Essential (primary) hypertension: Secondary | ICD-10-CM

## 2023-12-14 DIAGNOSIS — Z96651 Presence of right artificial knee joint: Secondary | ICD-10-CM | POA: Diagnosis not present

## 2023-12-14 DIAGNOSIS — Z87891 Personal history of nicotine dependence: Secondary | ICD-10-CM | POA: Diagnosis not present

## 2023-12-14 DIAGNOSIS — J449 Chronic obstructive pulmonary disease, unspecified: Secondary | ICD-10-CM | POA: Insufficient documentation

## 2023-12-14 DIAGNOSIS — M1611 Unilateral primary osteoarthritis, right hip: Secondary | ICD-10-CM | POA: Diagnosis present

## 2023-12-14 DIAGNOSIS — Z79899 Other long term (current) drug therapy: Secondary | ICD-10-CM | POA: Diagnosis not present

## 2023-12-14 DIAGNOSIS — Z7982 Long term (current) use of aspirin: Secondary | ICD-10-CM | POA: Diagnosis not present

## 2023-12-14 LAB — TYPE AND SCREEN
ABO/RH(D): AB POS
Antibody Screen: NEGATIVE

## 2023-12-14 SURGERY — ARTHROPLASTY, HIP, TOTAL, ANTERIOR APPROACH
Anesthesia: Spinal | Site: Hip | Laterality: Right

## 2023-12-14 MED ORDER — FENTANYL CITRATE (PF) 100 MCG/2ML IJ SOLN
INTRAMUSCULAR | Status: AC
  Filled 2023-12-14: qty 2

## 2023-12-14 MED ORDER — HYDROMORPHONE HCL 2 MG PO TABS
2.0000 mg | ORAL_TABLET | ORAL | Status: DC | PRN
  Administered 2023-12-14 (×2): 2 mg via ORAL
  Filled 2023-12-14 (×2): qty 1

## 2023-12-14 MED ORDER — MIDAZOLAM HCL 2 MG/2ML IJ SOLN
INTRAMUSCULAR | Status: DC | PRN
Start: 2023-12-14 — End: 2023-12-14
  Administered 2023-12-14: 1 mg via INTRAVENOUS

## 2023-12-14 MED ORDER — METHOCARBAMOL 1000 MG/10ML IJ SOLN: 500.0000 mg | Freq: Four times a day (QID) | INTRAMUSCULAR | Status: DC | PRN

## 2023-12-14 MED ORDER — ONDANSETRON HCL 4 MG PO TABS: 4.0000 mg | ORAL_TABLET | Freq: Four times a day (QID) | ORAL | Status: DC | PRN

## 2023-12-14 MED ORDER — ORAL CARE MOUTH RINSE: 15.0000 mL | Freq: Once | OROMUCOSAL | Status: AC

## 2023-12-14 MED ORDER — EPINEPHRINE PF 1 MG/ML IJ SOLN
INTRAMUSCULAR | Status: AC
  Filled 2023-12-14: qty 1

## 2023-12-14 MED ORDER — 0.9 % SODIUM CHLORIDE (POUR BTL) OPTIME
TOPICAL | Status: DC | PRN
  Administered 2023-12-14: 1000 mL

## 2023-12-14 MED ORDER — KETAMINE HCL 10 MG/ML IJ SOLN
INTRAMUSCULAR | Status: DC | PRN
  Administered 2023-12-14: 30 mg via INTRAVENOUS

## 2023-12-14 MED ORDER — BISACODYL 10 MG RE SUPP: 10.0000 mg | Freq: Every day | RECTAL | Status: DC | PRN

## 2023-12-14 MED ORDER — DOCUSATE SODIUM 100 MG PO CAPS
100.0000 mg | ORAL_CAPSULE | Freq: Two times a day (BID) | ORAL | Status: DC
  Administered 2023-12-14 – 2023-12-15 (×2): 100 mg via ORAL
  Filled 2023-12-14 (×2): qty 1

## 2023-12-14 MED ORDER — HYDROXYZINE HCL 25 MG PO TABS
25.0000 mg | ORAL_TABLET | Freq: Three times a day (TID) | ORAL | Status: DC | PRN
  Administered 2023-12-14 – 2023-12-15 (×2): 25 mg via ORAL
  Filled 2023-12-14 (×2): qty 1

## 2023-12-14 MED ORDER — CEFAZOLIN SODIUM-DEXTROSE 2-4 GM/100ML-% IV SOLN
2.0000 g | Freq: Four times a day (QID) | INTRAVENOUS | Status: AC
  Administered 2023-12-14 – 2023-12-15 (×2): 2 g via INTRAVENOUS
  Filled 2023-12-14 (×2): qty 100

## 2023-12-14 MED ORDER — FENTANYL CITRATE (PF) 100 MCG/2ML IJ SOLN
INTRAMUSCULAR | Status: DC | PRN
  Administered 2023-12-14 (×2): 50 ug via INTRAVENOUS

## 2023-12-14 MED ORDER — ACETAMINOPHEN 10 MG/ML IV SOLN
1000.0000 mg | Freq: Four times a day (QID) | INTRAVENOUS | Status: DC
  Administered 2023-12-14: 1000 mg via INTRAVENOUS
  Filled 2023-12-14: qty 100

## 2023-12-14 MED ORDER — METOCLOPRAMIDE HCL 5 MG/ML IJ SOLN: 5.0000 mg | Freq: Three times a day (TID) | INTRAMUSCULAR | Status: DC | PRN

## 2023-12-14 MED ORDER — PANTOPRAZOLE SODIUM 40 MG PO TBEC
40.0000 mg | DELAYED_RELEASE_TABLET | Freq: Every day | ORAL | Status: DC
  Administered 2023-12-15: 40 mg via ORAL
  Filled 2023-12-14: qty 1

## 2023-12-14 MED ORDER — ALBUTEROL SULFATE (2.5 MG/3ML) 0.083% IN NEBU: 3.0000 mL | INHALATION_SOLUTION | Freq: Four times a day (QID) | RESPIRATORY_TRACT | Status: DC | PRN

## 2023-12-14 MED ORDER — ONDANSETRON HCL 4 MG/2ML IJ SOLN
INTRAMUSCULAR | Status: DC | PRN
  Administered 2023-12-14: 4 mg via INTRAVENOUS

## 2023-12-14 MED ORDER — SODIUM CHLORIDE 0.9 % IV SOLN
INTRAVENOUS | Status: DC
Start: 2023-12-14 — End: 2023-12-15

## 2023-12-14 MED ORDER — DEXAMETHASONE SODIUM PHOSPHATE 10 MG/ML IJ SOLN
10.0000 mg | Freq: Once | INTRAMUSCULAR | Status: AC
  Administered 2023-12-15: 10 mg via INTRAVENOUS
  Filled 2023-12-14: qty 1

## 2023-12-14 MED ORDER — CEFAZOLIN SODIUM-DEXTROSE 2-4 GM/100ML-% IV SOLN
2.0000 g | INTRAVENOUS | Status: AC
  Administered 2023-12-14: 2 g via INTRAVENOUS
  Filled 2023-12-14: qty 100

## 2023-12-14 MED ORDER — PROPOFOL 500 MG/50ML IV EMUL
INTRAVENOUS | Status: DC | PRN
  Administered 2023-12-14: 40 ug/kg/min via INTRAVENOUS

## 2023-12-14 MED ORDER — TRANEXAMIC ACID-NACL 1000-0.7 MG/100ML-% IV SOLN
1000.0000 mg | INTRAVENOUS | Status: AC
  Administered 2023-12-14: 1000 mg via INTRAVENOUS
  Filled 2023-12-14: qty 100

## 2023-12-14 MED ORDER — HYDROMORPHONE HCL 1 MG/ML IJ SOLN
0.5000 mg | INTRAMUSCULAR | Status: DC | PRN
  Administered 2023-12-14 – 2023-12-15 (×4): 1 mg via INTRAVENOUS
  Filled 2023-12-14 (×5): qty 1

## 2023-12-14 MED ORDER — IRBESARTAN 150 MG PO TABS
300.0000 mg | ORAL_TABLET | Freq: Every day | ORAL | Status: DC
  Administered 2023-12-15: 300 mg via ORAL
  Filled 2023-12-14: qty 2

## 2023-12-14 MED ORDER — ONDANSETRON HCL 4 MG/2ML IJ SOLN: 4.0000 mg | Freq: Four times a day (QID) | INTRAMUSCULAR | Status: DC | PRN

## 2023-12-14 MED ORDER — DEXMEDETOMIDINE HCL IN NACL 80 MCG/20ML IV SOLN
INTRAVENOUS | Status: DC | PRN
  Administered 2023-12-14: 8 ug via INTRAVENOUS

## 2023-12-14 MED ORDER — KETAMINE HCL 50 MG/5ML IJ SOSY
PREFILLED_SYRINGE | INTRAMUSCULAR | Status: AC
  Filled 2023-12-14: qty 5

## 2023-12-14 MED ORDER — METOPROLOL SUCCINATE ER 50 MG PO TB24
100.0000 mg | ORAL_TABLET | Freq: Every day | ORAL | Status: DC
  Administered 2023-12-14: 100 mg via ORAL
  Filled 2023-12-14: qty 2

## 2023-12-14 MED ORDER — FAMOTIDINE 20 MG PO TABS
20.0000 mg | ORAL_TABLET | Freq: Two times a day (BID) | ORAL | Status: DC
  Administered 2023-12-14: 20 mg via ORAL
  Filled 2023-12-14 (×2): qty 1

## 2023-12-14 MED ORDER — METHOCARBAMOL 500 MG PO TABS
500.0000 mg | ORAL_TABLET | Freq: Four times a day (QID) | ORAL | Status: DC | PRN
  Administered 2023-12-14 – 2023-12-15 (×4): 500 mg via ORAL
  Filled 2023-12-14 (×7): qty 1

## 2023-12-14 MED ORDER — MAGNESIUM CITRATE PO SOLN: 1.0000 | Freq: Once | ORAL | Status: DC | PRN

## 2023-12-14 MED ORDER — DEXAMETHASONE SODIUM PHOSPHATE 10 MG/ML IJ SOLN
8.0000 mg | Freq: Once | INTRAMUSCULAR | Status: AC
  Administered 2023-12-14: 8 mg via INTRAVENOUS

## 2023-12-14 MED ORDER — BUPIVACAINE-EPINEPHRINE (PF) 0.25% -1:200000 IJ SOLN
INTRAMUSCULAR | Status: DC | PRN
  Administered 2023-12-14: 30 mL via PERINEURAL

## 2023-12-14 MED ORDER — HYDROCODONE-ACETAMINOPHEN 10-325 MG PO TABS
1.0000 | ORAL_TABLET | ORAL | Status: DC | PRN
  Administered 2023-12-14 – 2023-12-15 (×6): 1 via ORAL
  Filled 2023-12-14 (×6): qty 1

## 2023-12-14 MED ORDER — CHLORHEXIDINE GLUCONATE 0.12 % MT SOLN
15.0000 mL | Freq: Once | OROMUCOSAL | Status: AC
  Administered 2023-12-14: 15 mL via OROMUCOSAL

## 2023-12-14 MED ORDER — POLYETHYLENE GLYCOL 3350 17 G PO PACK: 17.0000 g | PACK | Freq: Every day | ORAL | Status: DC | PRN

## 2023-12-14 MED ORDER — WATER FOR IRRIGATION, STERILE IR SOLN
Status: DC | PRN
  Administered 2023-12-14: 1000 mL

## 2023-12-14 MED ORDER — LAMOTRIGINE 100 MG PO TABS
200.0000 mg | ORAL_TABLET | Freq: Every day | ORAL | Status: DC
  Administered 2023-12-14: 200 mg via ORAL
  Filled 2023-12-14: qty 2

## 2023-12-14 MED ORDER — ASPIRIN 81 MG PO CHEW
81.0000 mg | CHEWABLE_TABLET | Freq: Two times a day (BID) | ORAL | Status: DC
  Administered 2023-12-15: 81 mg via ORAL
  Filled 2023-12-14: qty 1

## 2023-12-14 MED ORDER — LACTATED RINGERS IV SOLN: INTRAVENOUS | Status: DC

## 2023-12-14 MED ORDER — GLYCOPYRROLATE PF 0.2 MG/ML IJ SOSY: PREFILLED_SYRINGE | INTRAMUSCULAR | Status: DC | PRN

## 2023-12-14 MED ORDER — MIDAZOLAM HCL 2 MG/2ML IJ SOLN
INTRAMUSCULAR | Status: AC
  Filled 2023-12-14: qty 2

## 2023-12-14 MED ORDER — ACETAMINOPHEN 325 MG PO TABS: 325.0000 mg | ORAL_TABLET | Freq: Four times a day (QID) | ORAL | Status: DC | PRN

## 2023-12-14 MED ORDER — PHENYLEPHRINE HCL-NACL 20-0.9 MG/250ML-% IV SOLN
INTRAVENOUS | Status: DC | PRN
  Administered 2023-12-14: 40 ug/min via INTRAVENOUS

## 2023-12-14 MED ORDER — POVIDONE-IODINE 10 % EX SWAB
2.0000 | Freq: Once | CUTANEOUS | Status: DC
Start: 2023-12-14 — End: 2023-12-14

## 2023-12-14 MED ORDER — BUPIVACAINE HCL (PF) 0.25 % IJ SOLN
INTRAMUSCULAR | Status: AC
  Filled 2023-12-14: qty 30

## 2023-12-14 MED ORDER — PROPOFOL 10 MG/ML IV BOLUS
INTRAVENOUS | Status: DC | PRN
  Administered 2023-12-14: 40 mg via INTRAVENOUS
  Administered 2023-12-14 (×3): 20 mg via INTRAVENOUS

## 2023-12-14 MED ORDER — METOCLOPRAMIDE HCL 5 MG PO TABS: 5.0000 mg | ORAL_TABLET | Freq: Three times a day (TID) | ORAL | Status: DC | PRN

## 2023-12-14 MED ORDER — PHENOL 1.4 % MT LIQD: 1.0000 | OROMUCOSAL | Status: DC | PRN

## 2023-12-14 MED ORDER — MENTHOL 3 MG MT LOZG: 1.0000 | LOZENGE | OROMUCOSAL | Status: DC | PRN

## 2023-12-14 MED ORDER — LISDEXAMFETAMINE DIMESYLATE 70 MG PO CAPS
70.0000 mg | ORAL_CAPSULE | Freq: Every day | ORAL | Status: DC
  Administered 2023-12-15: 70 mg via ORAL
  Filled 2023-12-14: qty 1

## 2023-12-14 MED ORDER — BUPIVACAINE IN DEXTROSE 0.75-8.25 % IT SOLN
INTRATHECAL | Status: DC | PRN
  Administered 2023-12-14: 1.7 mL via INTRATHECAL

## 2023-12-14 SURGICAL SUPPLY — 37 items
BAG COUNTER SPONGE SURGICOUNT (BAG) IMPLANT
BAG ZIPLOCK 12X15 (MISCELLANEOUS) IMPLANT
BALL HIP CERAMIC 32MM PLUS 9 IMPLANT
BLADE SAG 18X100X1.27 (BLADE) ×1 IMPLANT
COVER PERINEAL POST (MISCELLANEOUS) ×1 IMPLANT
COVER SURGICAL LIGHT HANDLE (MISCELLANEOUS) ×1 IMPLANT
CUP ACETBLR 52 OD PINNACLE (Hips) IMPLANT
DERMABOND ADVANCED .7 DNX12 (GAUZE/BANDAGES/DRESSINGS) ×1 IMPLANT
DRAPE FOOT SWITCH (DRAPES) ×1 IMPLANT
DRAPE STERI IOBAN 125X83 (DRAPES) ×1 IMPLANT
DRAPE U-SHAPE 47X51 STRL (DRAPES) ×2 IMPLANT
DRSG AQUACEL AG ADV 3.5X10 (GAUZE/BANDAGES/DRESSINGS) ×1 IMPLANT
DURAPREP 26ML APPLICATOR (WOUND CARE) ×1 IMPLANT
ELECT REM PT RETURN 15FT ADLT (MISCELLANEOUS) ×1 IMPLANT
GLOVE BIO SURGEON STRL SZ 6.5 (GLOVE) IMPLANT
GLOVE BIO SURGEON STRL SZ7 (GLOVE) IMPLANT
GLOVE BIO SURGEON STRL SZ8 (GLOVE) ×1 IMPLANT
GLOVE BIOGEL PI IND STRL 7.0 (GLOVE) IMPLANT
GLOVE BIOGEL PI IND STRL 8 (GLOVE) ×1 IMPLANT
GOWN STRL REUS W/ TWL LRG LVL3 (GOWN DISPOSABLE) ×2 IMPLANT
HIP BALL CERAMIC 32MM PLUS 9 ×1 IMPLANT
HOLDER FOLEY CATH W/STRAP (MISCELLANEOUS) ×1 IMPLANT
KIT TURNOVER KIT A (KITS) IMPLANT
LINER MARATHON NEUT +4X52X32 (Hips) IMPLANT
MANIFOLD NEPTUNE II (INSTRUMENTS) ×1 IMPLANT
PACK ANTERIOR HIP CUSTOM (KITS) ×1 IMPLANT
PENCIL SMOKE EVACUATOR COATED (MISCELLANEOUS) ×1 IMPLANT
SPIKE FLUID TRANSFER (MISCELLANEOUS) ×1 IMPLANT
STEM FEM ACTIS STD SZ7 (Nail) IMPLANT
SUT ETHIBOND NAB CT1 #1 30IN (SUTURE) ×1 IMPLANT
SUT MNCRL AB 4-0 PS2 18 (SUTURE) ×1 IMPLANT
SUT STRATAFIX 0 PDS 27 VIOLET (SUTURE) ×1 IMPLANT
SUT VIC AB 2-0 CT1 TAPERPNT 27 (SUTURE) ×2 IMPLANT
SUTURE STRATFX 0 PDS 27 VIOLET (SUTURE) ×1 IMPLANT
TOWEL GREEN STERILE FF (TOWEL DISPOSABLE) ×1 IMPLANT
TRAY FOLEY MTR SLVR 16FR STAT (SET/KITS/TRAYS/PACK) ×1 IMPLANT
TUBE SUCTION HIGH CAP CLEAR NV (SUCTIONS) ×1 IMPLANT

## 2023-12-14 NOTE — Progress Notes (Signed)
 Patient complaining of extreme pain. Paged Emerge.

## 2023-12-14 NOTE — Anesthesia Procedure Notes (Signed)
 Spinal  Patient location during procedure: OR Start time: 12/14/2023 2:08 PM End time: 12/14/2023 2:12 PM Reason for block: surgical anesthesia Staffing Performed: anesthesiologist  Anesthesiologist: Mariann Barter, MD Performed by: Mariann Barter, MD Authorized by: Mariann Barter, MD   Preanesthetic Checklist Completed: patient identified, IV checked, site marked, risks and benefits discussed, surgical consent, monitors and equipment checked, pre-op evaluation and timeout performed Spinal Block Patient position: sitting Prep: DuraPrep Patient monitoring: heart rate, cardiac monitor, continuous pulse ox and blood pressure Approach: midline Location: L3-4 Injection technique: single-shot Needle Needle type: Sprotte  Needle gauge: 24 G Needle length: 9 cm Assessment Sensory level: T4 Events: CSF return

## 2023-12-14 NOTE — Plan of Care (Signed)

## 2023-12-14 NOTE — Interval H&P Note (Signed)
 History and Physical Interval Note:  12/14/2023 1:26 PM  Philip Gates  has presented today for surgery, with the diagnosis of Right Hip Osteoarthritis.  The various methods of treatment have been discussed with the patient and family. After consideration of risks, benefits and other options for treatment, the patient has consented to  Procedure(s): ARTHROPLASTY, HIP, TOTAL, ANTERIOR APPROACH (Right) as a surgical intervention.  The patient's history has been reviewed, patient examined, no change in status, stable for surgery.  I have reviewed the patient's chart and labs.  Questions were answered to the patient's satisfaction.     Philip Gates

## 2023-12-14 NOTE — Op Note (Signed)
 OPERATIVE REPORT- TOTAL HIP ARTHROPLASTY   PREOPERATIVE DIAGNOSIS: Osteoarthritis of the Right hip.   POSTOPERATIVE DIAGNOSIS: Osteoarthritis of the Right  hip.   PROCEDURE: Right total hip arthroplasty, anterior approach.   SURGEON: Ollen Gross, MD   ASSISTANT: Weston Brass, PA-C  ANESTHESIA:  Spinal  ESTIMATED BLOOD LOSS:-250 mL    DRAINS: None  COMPLICATIONS: None   CONDITION: PACU - hemodynamically stable.   BRIEF CLINICAL NOTE: Philip Gates is a 68 y.o. male who has advanced end-  stage arthritis of their Right  hip with progressively worsening pain and  dysfunction.The patient has failed nonoperative management and presents for  total hip arthroplasty.   PROCEDURE IN DETAIL: After successful administration of spinal  anesthetic, the traction boots for the [facility] bed were placed on both  feet and the patient was placed onto the [facility] bed, boots placed into the leg  holders. The Right hip was then isolated from the perineum with plastic  drapes and prepped and draped in the usual sterile fashion. ASIS and  greater trochanter were marked and a oblique incision was made, starting  at about 1 cm lateral and 2 cm distal to the ASIS and coursing towards  the anterior cortex of the femur. The skin was cut with a 10 blade  through subcutaneous tissue to the level of the fascia overlying the  tensor fascia lata muscle. The fascia was then incised in line with the  incision at the junction of the anterior third and posterior 2/3rd. The  muscle was teased off the fascia and then the interval between the TFL  and the rectus was developed. The Hohmann retractor was then placed at  the top of the femoral neck over the capsule. The vessels overlying the  capsule were cauterized and the fat on top of the capsule was removed.  A Hohmann retractor was then placed anterior underneath the rectus  femoris to give exposure to the entire anterior capsule. A T-shaped   capsulotomy was performed. The edges were tagged and the femoral head  was identified.       Osteophytes are removed off the superior acetabulum.  The femoral neck was then cut in situ with an oscillating saw. Traction  was then applied to the left lower extremity utilizing the [facility]  traction. The femoral head was then removed. Retractors were placed  around the acetabulum and then circumferential removal of the labrum was  performed. Osteophytes were also removed. Reaming starts at 47 mm to  medialize and  Increased in 2 mm increments to 51 mm. We reamed in  approximately 40 degrees of abduction, 20 degrees anteversion. A 52 mm  pinnacle acetabular shell was then impacted in anatomic position under  fluoroscopic guidance with excellent purchase. We did not need to place  any additional dome screws. A 32 mm neutral + 4 marathon liner was then  placed into the acetabular shell.       The femoral lift was then placed along the lateral aspect of the femur  just distal to the vastus ridge. The leg was  externally rotated and capsule  was stripped off the inferior aspect of the femoral neck down to the  level of the lesser trochanter, this was done with electrocautery. The femur was lifted after this was performed. The  leg was then placed in an extended and adducted position essentially delivering the femur. We also removed the capsule superiorly and the piriformis from the piriformis fossa to  gain excellent exposure of the  proximal femur. Rongeur was used to remove some cancellous bone to get  into the lateral portion of the proximal femur for placement of the  initial starter reamer. The starter broaches was placed  the starter broach  and was shown to go down the center of the canal. Broaching  with the Actis system was then performed starting at size 0  coursing  Up to size 7. A size 7 had excellent torsional and rotational  and axial stability. The trial standard offset neck was then  placed  with a 32 + 9 trial head. The hip was then reduced. We confirmed that  the stem was in the canal both on AP and lateral x-rays. It also has excellent sizing. The hip was reduced with outstanding stability through full extension and full external rotation.. AP pelvis was taken and the leg lengths were measured and found to be equal. Hip was then dislocated again and the femoral head and neck removed. The  femoral broach was removed. Size 7 Actis stem with a standard offset  neck was then impacted into the femur following native anteversion. Has  excellent purchase in the canal. Excellent torsional and rotational and  axial stability. It is confirmed to be in the canal on AP and lateral  fluoroscopic views. The 32 + 9 ceramic head was placed and the hip  reduced with outstanding stability. Again AP pelvis was taken and it  confirmed that the leg lengths were equal. The wound was then copiously  irrigated with saline solution and the capsule reattached and repaired  with Ethibond suture. 30 ml of .25% Bupivicaine was  injected into the capsule and into the edge of the tensor fascia lata as well as subcutaneous tissue. The fascia overlying the tensor fascia lata was then closed with a running #1 V-Loc. Subcu was closed with interrupted 2-0 Vicryl and subcuticular running 4-0 Monocryl. Incision was cleaned  and dried. Steri-Strips and a bulky sterile dressing applied. The patient was awakened and transported to  recovery in stable condition.        Please note that a surgical assistant was a medical necessity for this procedure to perform it in a safe and expeditious manner. Assistant was necessary to provide appropriate retraction of vital neurovascular structures and to prevent femoral fracture and allow for anatomic placement of the prosthesis.  Ollen Gross, M.D.

## 2023-12-14 NOTE — Discharge Instructions (Signed)
Ollen Gross, MD Total Joint Specialist [facility] [address], [address] [place], [place] [address] [phone]  ANTERIOR APPROACH TOTAL HIP REPLACEMENT POSTOPERATIVE DIRECTIONS     Hip Rehabilitation, Guidelines Following Surgery  The results of a hip operation are greatly improved after range of motion and muscle strengthening exercises. Follow all safety measures which are given to protect your hip. If any of these exercises cause increased pain or swelling in your joint, decrease the amount until you are comfortable again. Then slowly increase the exercises. Call your caregiver if you have problems or questions.   BLOOD CLOT PREVENTION Take an 81 mg Aspirin two times a day for three weeks following surgery. Then resume one 81 mg Aspirin once a day. You may resume your vitamins/supplements upon discharge from the hospital. Do not take any NSAIDs (Advil, Aleve, Ibuprofen, Meloxicam, etc.) until you are 3 weeks out from surgery  HOME CARE INSTRUCTIONS  Remove items at home which could result in a fall. This includes throw rugs or furniture in walking pathways.  ICE to the affected hip as frequently as 20-30 minutes an hour and then as needed for pain and swelling. Continue to use ice on the hip for pain and swelling from surgery. You may notice swelling that will progress down to the foot and ankle. This is normal after surgery. Elevate the leg when you are not up walking on it.   Continue to use the breathing machine which will help keep your temperature down.  It is common for your temperature to cycle up and down following surgery, especially at night when you are not up moving around and exerting yourself.  The breathing machine keeps your lungs expanded and your temperature down.  DIET You may resume your previous home diet once your are discharged from the hospital.  DRESSING / WOUND CARE / SHOWERING You have an adhesive waterproof bandage over the  incision. Leave this in place until your first follow-up appointment. Once you remove this you will not need to place another bandage.  You may begin showering 3 days following surgery, but do not submerge the incision under water.  ACTIVITY For the first 3-5 days, it is important to rest and keep the operative leg elevated. You should, as a general rule, rest for 50 minutes and walk/stretch for 10 minutes per hour. After 5 days, you may slowly increase activity as tolerated.  Perform the exercises you were provided twice a day for about 15-20 minutes each session. Begin these 2 days following surgery. Walk with your walker as instructed. Use the walker until you are comfortable transitioning to a cane. Walk with the cane in the opposite hand of the operative leg. You may discontinue the cane once you are comfortable and walking steadily. Avoid periods of inactivity such as sitting longer than an hour when not asleep. This helps prevent blood clots.  Do not drive a car for 6 weeks or until released by your surgeon.  Do not drive while taking narcotics.  TED HOSE STOCKINGS Wear the elastic stockings on both legs for three weeks following surgery during the day. You may remove them at night while sleeping.  WEIGHT BEARING Weight bearing as tolerated with assist device (walker, cane, etc) as directed, use it as long as suggested by your surgeon or therapist, typically at least 4-6 weeks.  POSTOPERATIVE CONSTIPATION PROTOCOL Constipation - defined medically as fewer than three stools per week and severe constipation as less than one stool per week.  less than one stool per week.  One of the most common issues patients have following surgery is constipation.  Even if you have a regular bowel pattern at home, your normal regimen is likely to be disrupted due to multiple reasons following surgery.  Combination of anesthesia, postoperative narcotics, change in appetite and fluid intake all can  affect your bowels.  In order to avoid complications following surgery, here are some recommendations in order to help you during your recovery period.  Colace (docusate) - Pick up an over-the-counter form of Colace or another stool softener and take twice a day as long as you are requiring postoperative pain medications.  Take with a full glass of water daily.  If you experience loose stools or diarrhea, hold the colace until you stool forms back up.  If your symptoms do not get better within 1 week or if they get worse, check with your doctor. Dulcolax (bisacodyl) - Pick up over-the-counter and take as directed by the product packaging as needed to assist with the movement of your bowels.  Take with a full glass of water.  Use this product as needed if not relieved by Colace only.  MiraLax (polyethylene glycol) - Pick up over-the-counter to have on hand.  MiraLax is a solution that will increase the amount of water in your bowels to assist with bowel movements.  Take as directed and can mix with a glass of water, juice, soda, coffee, or tea.  Take if you go more than two days without a movement.Do not use MiraLax more than once per day. Call your doctor if you are still constipated or irregular after using this medication for 7 days in a row.  If you continue to have problems with postoperative constipation, please contact the office for further assistance and recommendations.  If you experience "the worst abdominal pain ever" or develop nausea or vomiting, please contact the office immediatly for further recommendations for treatment.  ITCHING  If you experience itching with your medications, try taking only a single pain pill, or even half a pain pill at a time.  You can also use Benadryl over the counter for itching or also to help with sleep.   MEDICATIONS See your medication summary on the "After Visit Summary" that the nursing staff will review with you prior to discharge.  You may have some home  medications which will be placed on hold until you complete the course of blood thinner medication.  It is important for you to complete the blood thinner medication as prescribed by your surgeon.  Continue your approved medications as instructed at time of discharge.  PRECAUTIONS If you experience chest pain or shortness of breath - call 911 immediately for transfer to the hospital emergency department.  If you develop a fever greater that 101 F, purulent drainage from wound, increased redness or drainage from wound, foul odor from the wound/dressing, or calf pain - CONTACT YOUR SURGEON.                                                   FOLLOW-UP APPOINTMENTS Make sure you keep all of your appointments after your operation with your surgeon and caregivers. You should call the office at the above phone number and make an appointment for approximately two weeks after the date of your surgery or on the   date instructed by your surgeon outlined in the "After Visit Summary".  RANGE OF MOTION AND STRENGTHENING EXERCISES  These exercises are designed to help you keep full movement of your hip joint. Follow your caregiver's or physical therapist's instructions. Perform all exercises about fifteen times, three times per day or as directed. Exercise both hips, even if you have had only one joint replacement. These exercises can be done on a training (exercise) mat, on the floor, on a table or on a bed. Use whatever works the best and is most comfortable for you. Use music or television while you are exercising so that the exercises are a pleasant break in your day. This will make your life better with the exercises acting as a break in routine you can look forward to.  Lying on your back, slowly slide your foot toward your buttocks, raising your knee up off the floor. Then slowly slide your foot back down until your leg is straight again.  Lying on your back spread your legs as far apart as you can without causing  discomfort.  Lying on your side, raise your upper leg and foot straight up from the floor as far as is comfortable. Slowly lower the leg and repeat.  Lying on your back, tighten up the muscle in the front of your thigh (quadriceps muscles). You can do this by keeping your leg straight and trying to raise your heel off the floor. This helps strengthen the largest muscle supporting your knee.  Lying on your back, tighten up the muscles of your buttocks both with the legs straight and with the knee bent at a comfortable angle while keeping your heel on the floor.   POST-OPERATIVE OPIOID TAPER INSTRUCTIONS: It is important to wean off of your opioid medication as soon as possible. If you do not need pain medication after your surgery it is ok to stop day one. Opioids include: Codeine, Hydrocodone(Norco, Vicodin), Oxycodone(Percocet, oxycontin) and hydromorphone amongst others.  Long term and even short term use of opiods can cause: Increased pain response Dependence Constipation Depression Respiratory depression And more.  Withdrawal symptoms can include Flu like symptoms Nausea, vomiting And more Techniques to manage these symptoms Hydrate well Eat regular healthy meals Stay active Use relaxation techniques(deep breathing, meditating, yoga) Do Not substitute Alcohol to help with tapering If you have been on opioids for less than two weeks and do not have pain than it is ok to stop all together.  Plan to wean off of opioids This plan should start within one week post op of your joint replacement. Maintain the same interval or time between taking each dose and first decrease the dose.  Cut the total daily intake of opioids by one tablet each day Next start to increase the time between doses. The last dose that should be eliminated is the evening dose.   IF YOU ARE TRANSFERRED TO A SKILLED REHAB FACILITY If the patient is transferred to a skilled rehab facility following release from the  hospital, a list of the current medications will be sent to the facility for the patient to continue.  When discharged from the skilled rehab facility, please have the facility set up the patient's Home Health Physical Therapy prior to being released. Also, the skilled facility will be responsible for providing the patient with their medications at time of release from the facility to include their pain medication, the muscle relaxants, and their blood thinner medication. If the patient is still at the rehab facility   up appointment, the skilled rehab facility will also need to assist the patient in arranging follow up appointment in our office and any transportation needs.  MAKE SURE YOU:  Understand these instructions.  Get help right away if you are not doing well or get worse.    DENTAL ANTIBIOTICS:  In most cases prophylactic antibiotics for Dental procdeures after total joint surgery are not necessary.  Exceptions are as follows:  1. History of prior total joint infection  2. Severely immunocompromised (Organ Transplant, cancer chemotherapy, Rheumatoid biologic meds such as Humera)  3. Poorly controlled diabetes (A1C &gt; 8.0, blood glucose over 200)  If you have one of these conditions, contact your surgeon for an antibiotic prescription, prior to your dental procedure.    Pick up stool softner and laxative for home use following surgery while on pain medications. Do not submerge incision under water. Please use good hand washing techniques while changing dressing each day. May shower starting three days after surgery. Please use a clean towel to pat the incision dry following showers. Continue to use ice for pain and swelling after surgery. Do not use any lotions or creams on the incision until instructed by your surgeon.  

## 2023-12-14 NOTE — Transfer of Care (Signed)
 Immediate Anesthesia Transfer of Care Note  Patient: Philip Gates  Procedure(s) Performed: ARTHROPLASTY, HIP, TOTAL, ANTERIOR APPROACH (Right: Hip)  Patient Location: PACU  Anesthesia Type:Spinal and MAC combined with regional for post-op pain  Level of Consciousness: awake, alert , oriented, and patient cooperative  Airway & Oxygen Therapy: Patient Spontanous Breathing and Patient connected to face mask oxygen  Post-op Assessment: Report given to RN and Post -op Vital signs reviewed and stable  Post vital signs: Reviewed and stable  Last Vitals:  Vitals Value Taken Time  BP 124/74 12/14/23 1543  Temp    Pulse 91 12/14/23 1545  Resp 15 12/14/23 1545  SpO2 99 % 12/14/23 1545  Vitals shown include unfiled device data.  Last Pain:  Vitals:   12/14/23 1316  TempSrc: Oral  PainSc:          Complications: No notable events documented.

## 2023-12-14 NOTE — Anesthesia Postprocedure Evaluation (Signed)
 Anesthesia Post Note  Patient: Philip Gates  Procedure(s) Performed: ARTHROPLASTY, HIP, TOTAL, ANTERIOR APPROACH (Right: Hip)     Patient location during evaluation: PACU Anesthesia Type: Spinal Level of consciousness: awake and alert Pain management: pain level controlled Vital Signs Assessment: post-procedure vital signs reviewed and stable Respiratory status: spontaneous breathing, nonlabored ventilation, respiratory function stable and patient connected to nasal cannula oxygen Cardiovascular status: blood pressure returned to baseline and stable Postop Assessment: no apparent nausea or vomiting Anesthetic complications: no   No notable events documented.  Last Vitals:  Vitals:   12/14/23 1700 12/14/23 1715  BP: (!) 144/103 (!) 145/94  Pulse: 88 90  Resp: 14 12  Temp:    SpO2: 100% 100%    Last Pain:  Vitals:   12/14/23 1715  TempSrc:   PainSc: 0-No pain    LLE Motor Response: Purposeful movement (12/14/23 1715) LLE Sensation: Decreased;Numbness (12/14/23 1715) RLE Motor Response: Purposeful movement (12/14/23 1715) RLE Sensation: Decreased;Numbness (12/14/23 1715) L Sensory Level: L4-Anterior knee, lower leg (12/14/23 1715) R Sensory Level: L3-Anterior knee, lower leg (12/14/23 1715)  Mariann Barter

## 2023-12-15 ENCOUNTER — Other Ambulatory Visit (HOSPITAL_COMMUNITY): Payer: Self-pay

## 2023-12-15 ENCOUNTER — Encounter (HOSPITAL_COMMUNITY): Payer: Self-pay | Admitting: Orthopedic Surgery

## 2023-12-15 DIAGNOSIS — M1611 Unilateral primary osteoarthritis, right hip: Secondary | ICD-10-CM | POA: Diagnosis not present

## 2023-12-15 LAB — CBC
HCT: 39.9 % (ref 39.0–52.0)
Hemoglobin: 13.7 g/dL (ref 13.0–17.0)
MCH: 31.6 pg (ref 26.0–34.0)
MCHC: 34.3 g/dL (ref 30.0–36.0)
MCV: 91.9 fL (ref 80.0–100.0)
Platelets: 245 10*3/uL (ref 150–400)
RBC: 4.34 MIL/uL (ref 4.22–5.81)
RDW: 12.7 % (ref 11.5–15.5)
WBC: 13.5 10*3/uL — ABNORMAL HIGH (ref 4.0–10.5)
nRBC: 0 % (ref 0.0–0.2)

## 2023-12-15 LAB — BASIC METABOLIC PANEL WITH GFR
Anion gap: 8 (ref 5–15)
BUN: 8 mg/dL (ref 8–23)
CO2: 26 mmol/L (ref 22–32)
Calcium: 9.3 mg/dL (ref 8.9–10.3)
Chloride: 101 mmol/L (ref 98–111)
Creatinine, Ser: 0.83 mg/dL (ref 0.61–1.24)
GFR, Estimated: >60 mL/min (ref >60)
Glucose, Bld: 131 mg/dL — ABNORMAL HIGH (ref 70–99)
Potassium: 4.6 mmol/L (ref 3.5–5.1)
Sodium: 135 mmol/L (ref 135–145)

## 2023-12-15 MED ORDER — HYDROMORPHONE HCL 2 MG PO TABS
2.0000 mg | ORAL_TABLET | Freq: Four times a day (QID) | ORAL | 0 refills | Status: AC | PRN
Start: 2023-12-15 — End: ?
  Filled 2023-12-15: qty 42, 7d supply, fill #0

## 2023-12-15 MED ORDER — ASPIRIN 81 MG PO CHEW
81.0000 mg | CHEWABLE_TABLET | Freq: Two times a day (BID) | ORAL | 0 refills | Status: AC
  Filled 2023-12-15: qty 42, 21d supply, fill #0

## 2023-12-15 MED ORDER — ONDANSETRON HCL 4 MG PO TABS
4.0000 mg | ORAL_TABLET | Freq: Four times a day (QID) | ORAL | 0 refills | Status: AC | PRN
  Filled 2023-12-15: qty 20, 5d supply, fill #0

## 2023-12-15 MED ORDER — METHOCARBAMOL 500 MG PO TABS
500.0000 mg | ORAL_TABLET | Freq: Four times a day (QID) | ORAL | 0 refills | Status: AC | PRN
  Filled 2023-12-15: qty 40, 10d supply, fill #0

## 2023-12-15 NOTE — Progress Notes (Signed)
 Patient refused to dangle due to pain level.

## 2023-12-15 NOTE — Progress Notes (Signed)
 Physical Therapy Treatment Patient Details Name: Philip Gates MRN: [id number] DOB: June 26, 1956 Today's Date: 12/15/2023   History of Present Illness 68 yo male s/p R THA on 12/14/23. PMH: back surgery-decompression L5-S1, chronic neck pain, HTN, hearing loss, COPD, BPH, bipolar, ADHA, OA, asthma, L THA, R TKA    PT Comments  Pt making steady progress, meeting goals and feels ready to d/c  with spouse assisting prn; see below for mobility.    If plan is discharge home, recommend the following:     Can travel by private vehicle        Equipment Recommendations  None recommended by PT    Recommendations for Other Services       Precautions / Restrictions Precautions Precautions: Fall Restrictions Weight Bearing Restrictions Per Provider Order: No Other Position/Activity Restrictions: WBAT     Mobility  Bed Mobility Overal bed mobility: Needs Assistance Bed Mobility: Supine to Sit     Supine to sit: Supervision, HOB elevated     General bed mobility comments: in recliner    Transfers Overall transfer level: Needs assistance Equipment used: Rolling walker (2 wheels) Transfers: Sit to/from Stand Sit to Stand: Contact guard assist, Supervision           General transfer comment: for safety    Ambulation/Gait Ambulation/Gait assistance: Contact guard assist, Supervision Gait Distance (Feet): 90 Feet Assistive device: Rolling walker (2 wheels) Gait Pattern/deviations: Step-to pattern, Step-through pattern       General Gait Details: cues for RW position and sequence   Stairs Stairs: Yes Stairs assistance: Contact guard assist, Min assist Stair Management: No rails, Step to pattern, Backwards, With walker Number of Stairs: 1 General stair comments: cues for sequence and RW position. light assist with RW positioning and to stabilize   Wheelchair Mobility     Tilt Bed    Modified Rankin (Stroke Patients Only)       Balance Overall balance  assessment: Mild deficits observed, not formally tested                                          Communication Communication Communication: No apparent difficulties  Cognition Arousal: Alert Behavior During Therapy: WFL for tasks assessed/performed   PT - Cognitive impairments: No apparent impairments                         Following commands: Intact      Cueing Cueing Techniques: Verbal cues  Exercises      General Comments General comments (skin integrity, edema, etc.): pt reports he has exercise program at home      Pertinent Vitals/Pain Pain Assessment Pain Assessment: Faces Faces Pain Scale: Hurts little more Pain Location: right hip Pain Descriptors / Indicators: Sore Pain Intervention(s): Monitored during session, Limited activity within patient's tolerance, Premedicated before session, Repositioned    Home Living Family/patient expects to be discharged to:: Private residence Living Arrangements: Spouse/significant other Available Help at Discharge: Family Type of Home: House Home Access: Stairs to enter   Secretary/administrator of Steps: 1   Home Layout: One level Home Equipment: Agricultural consultant (2 wheels);Shower seat - built in;Cane - single point;Rollator (4 wheels)      Prior Function            PT Goals (current goals can now be found in the care plan section) Acute Rehab  PT Goals PT Goal Formulation: With patient Time For Goal Achievement: 12/22/23 Potential to Achieve Goals: Good Progress towards PT goals: Progressing toward goals    Frequency    7X/week      PT Plan      Co-evaluation              AM-PAC PT "6 Clicks" Mobility   Outcome Measure  Help needed turning from your back to your side while in a flat bed without using bedrails?: None Help needed moving from lying on your back to sitting on the side of a flat bed without using bedrails?: None Help needed moving to and from a bed to a chair  (including a wheelchair)?: None Help needed standing up from a chair using your arms (e.g., wheelchair or bedside chair)?: None Help needed to walk in hospital room?: A Little Help needed climbing 3-5 steps with a railing? : A Little 6 Click Score: 22    End of Session Equipment Utilized During Treatment: Gait belt Activity Tolerance: Patient tolerated treatment well Patient left: in chair;with call bell/phone within reach;with chair alarm set;with family/visitor present   PT Visit Diagnosis: Other abnormalities of gait and mobility (R26.89)     Time: 6578-4696 PT Time Calculation (min) (ACUTE ONLY): 15 min  Charges:    $Gait Training: 8-22 mins PT General Charges $$ ACUTE PT VISIT: 1 Visit                     Jancy Sprankle, PT  Acute Rehab Dept [facility]) [phone]  12/15/2023    [facility] 12/15/2023, 2:53 PM

## 2023-12-15 NOTE — TOC Transition Note (Signed)
 Transition of Care [facility]) - Discharge Note   Patient Details  Name: Philip Gates MRN: [id number] Date of Birth: 01-27-1956  [facility]) CM/SW Contact:  Howell Rucks, RN Phone Number: 12/15/2023, 11:47 AM   Clinical Narrative:  Met with patient at bedside to review dc therapy and home equipment needs, pt confirmed HEP, has RW, no home DME needs.      Final next level of care: Home/Self Care Barriers to Discharge: No Barriers Identified   Patient Goals and CMS Choice Patient states their goals for this hospitalization and ongoing recovery are:: return home          Discharge Placement                       Discharge Plan and Services Additional resources added to the After Visit Summary for                                       Social Drivers of Health (SDOH) Interventions SDOH Screenings   Food Insecurity: No Food Insecurity (12/14/2023)  Housing: Low Risk  (12/14/2023)  Transportation Needs: No Transportation Needs (12/14/2023)  Utilities: Not At Risk (12/14/2023)  Depression (PHQ2-9): Low Risk  (05/06/2022)  Financial Resource Strain: Medium Risk (10/26/2023)   Received from [facility]  Physical Activity: Inactive (10/26/2023)   Received from [facility]  Social Connections: Socially Integrated (12/14/2023)  Stress: No Stress Concern Present (10/26/2023)   Received from [facility]  Tobacco Use: Medium Risk (12/14/2023)     Readmission Risk Interventions    09/13/2021    3:14 PM  Readmission Risk Prevention Plan  Transportation Screening Complete  Palliative Care Screening Not Applicable  Medication Review (RN Care Manager) Complete

## 2023-12-15 NOTE — Care Management Obs Status (Signed)
 MEDICARE OBSERVATION STATUS NOTIFICATION   Patient Details  Name: Philip Gates MRN: [id number] Date of Birth: 1955/10/23   Medicare Observation Status Notification Given:  Yes    Howell Rucks, RN 12/15/2023, 10:18 AM

## 2023-12-15 NOTE — Plan of Care (Signed)
  Problem: Education: Goal: Knowledge of General Education information will improve Description: Including pain rating scale, medication(s)/side effects and non-pharmacologic comfort measures Outcome: Not Progressing   Problem: Health Behavior/Discharge Planning: Goal: Ability to manage health-related needs will improve Outcome: Not Progressing   Problem: Clinical Measurements: Goal: Ability to maintain clinical measurements within normal limits will improve Outcome: Not Progressing Goal: Will remain free from infection Outcome: Not Progressing Goal: Diagnostic test results will improve Outcome: Not Progressing Goal: Respiratory complications will improve Outcome: Not Progressing Goal: Cardiovascular complication will be avoided Outcome: Not Progressing   Problem: Activity: Goal: Risk for activity intolerance will decrease Outcome: Not Progressing   Problem: Nutrition: Goal: Adequate nutrition will be maintained Outcome: Not Progressing   Problem: Coping: Goal: Level of anxiety will decrease Outcome: Not Progressing   Problem: Elimination: Goal: Will not experience complications related to bowel motility Outcome: Not Progressing Goal: Will not experience complications related to urinary retention Outcome: Not Progressing   Problem: Pain Managment: Goal: General experience of comfort will improve and/or be controlled Outcome: Not Progressing   Problem: Safety: Goal: Ability to remain free from injury will improve Outcome: Not Progressing   Problem: Skin Integrity: Goal: Risk for impaired skin integrity will decrease Outcome: Not Progressing   Problem: Education: Goal: Knowledge of the prescribed therapeutic regimen will improve Outcome: Not Progressing Goal: Understanding of discharge needs will improve Outcome: Not Progressing Goal: Individualized Educational Video(s) Outcome: Not Progressing   Problem: Activity: Goal: Ability to avoid complications of  mobility impairment will improve Outcome: Not Progressing Goal: Ability to tolerate increased activity will improve Outcome: Not Progressing   Problem: Clinical Measurements: Goal: Postoperative complications will be avoided or minimized Outcome: Not Progressing   Problem: Pain Management: Goal: Pain level will decrease with appropriate interventions Outcome: Not Progressing   Problem: Skin Integrity: Goal: Will show signs of wound healing Outcome: Not Progressing

## 2023-12-15 NOTE — Evaluation (Signed)
 Physical Therapy Evaluation Patient Details Name: Philip Gates MRN: [id number] DOB: [birth year] Today's Date: 12/15/2023  History of Present Illness  68 yo male s/p R THA on 12/14/23. PMH: back surgery-decompression L5-S1, chronic neck pain, HTN, hearing loss, COPD, BPH, bipolar, ADHA, OA, asthma, L THA, R TKA  Clinical Impression   Pt is s/p THA resulting in the deficits listed below (see PT Problem List).  Pt amb 80' with RW and CGA, pain improved with activity. Will see again this pm. Pt is motivated to d/c home today.  Pt will benefit from acute skilled PT to increase their independence and safety with mobility to facilitate discharge.          If plan is discharge home, recommend the following:     Can travel by private vehicle        Equipment Recommendations None recommended by PT  Recommendations for Other Services       Functional Status Assessment Patient has had a recent decline in their functional status and demonstrates the ability to make significant improvements in function in a reasonable and predictable amount of time.     Precautions / Restrictions Precautions Precautions: Fall Restrictions Other Position/Activity Restrictions: WBAT      Mobility  Bed Mobility Overal bed mobility: Needs Assistance Bed Mobility: Supine to Sit     Supine to sit: Supervision, HOB elevated     General bed mobility comments: incr time and effort    Transfers Overall transfer level: Needs assistance Equipment used: Rolling walker (2 wheels) Transfers: Sit to/from Stand Sit to Stand: Contact guard assist           General transfer comment: STS x2 from EOB, cues for hand placement    Ambulation/Gait   Gait Distance (Feet): 80 Feet Assistive device: Rolling walker (2 wheels) Gait Pattern/deviations: Step-to pattern, Step-through pattern       General Gait Details: cues for RW position and sequence  Stairs            Wheelchair Mobility     Tilt  Bed    Modified Rankin (Stroke Patients Only)       Balance Overall balance assessment: Mild deficits observed, not formally tested                                           Pertinent Vitals/Pain Pain Assessment Pain Assessment: Faces Faces Pain Scale: Hurts little more Pain Location: right hip Pain Descriptors / Indicators: Sore Pain Intervention(s): Limited activity within patient's tolerance, Monitored during session, Premedicated before session, Repositioned    Home Living Family/patient expects to be discharged to:: Private residence Living Arrangements: Spouse/significant other Available Help at Discharge: Family Type of Home: House Home Access: Stairs to enter   Secretary/administrator of Steps: 1   Home Layout: One level Home Equipment: Agricultural consultant (2 wheels);Shower seat - built in;Cane - single point;Rollator (4 wheels)      Prior Function Prior Level of Function : Independent/Modified Independent             Mobility Comments: amb with cane or RW for 2 wks prior surgery       Extremity/Trunk Assessment   Upper Extremity Assessment Upper Extremity Assessment: Overall WFL for tasks assessed    Lower Extremity Assessment Lower Extremity Assessment: RLE deficits/detail RLE Deficits / Details: ankle grossly WFL, knee and hip grossly 3/5, limited by  post op deficits and pain    Cervical / Trunk Assessment Cervical / Trunk Assessment: Other exceptions Cervical / Trunk Exceptions: cervical  flexion  Communication   Communication Communication: No apparent difficulties    Cognition Arousal: Alert Behavior During Therapy: WFL for tasks assessed/performed   PT - Cognitive impairments: No apparent impairments                         Following commands: Intact       Cueing Cueing Techniques: Verbal cues     General Comments      Exercises     Assessment/Plan    PT Assessment Patient needs continued PT  services  PT Problem List         PT Treatment Interventions DME instruction;Gait training;Functional mobility training;Therapeutic activities;Therapeutic exercise;Patient/family education    PT Goals (Current goals can be found in the Care Plan section)  Acute Rehab PT Goals PT Goal Formulation: With patient Time For Goal Achievement: 12/22/23 Potential to Achieve Goals: Good    Frequency 7X/week     Co-evaluation               AM-PAC PT "6 Clicks" Mobility  Outcome Measure Help needed turning from your back to your side while in a flat bed without using bedrails?: A Little Help needed moving from lying on your back to sitting on the side of a flat bed without using bedrails?: A Little Help needed moving to and from a bed to a chair (including a wheelchair)?: A Little Help needed standing up from a chair using your arms (e.g., wheelchair or bedside chair)?: A Little Help needed to walk in hospital room?: A Little Help needed climbing 3-5 steps with a railing? : A Little 6 Click Score: 18    End of Session Equipment Utilized During Treatment: Gait belt Activity Tolerance: Patient tolerated treatment well Patient left: with call bell/phone within reach;with chair alarm set;with family/visitor present   PT Visit Diagnosis: Other abnormalities of gait and mobility (R26.89)    Time: 1610-9604 PT Time Calculation (min) (ACUTE ONLY): 28 min   Charges:   PT Evaluation $PT Eval Low Complexity: 1 Low PT Treatments $Gait Training: 8-22 mins PT General Charges $$ ACUTE PT VISIT: 1 Visit         Philip Gates, PT  Acute Rehab Dept ([facility]) [phone]  12/15/2023   [facility] 12/15/2023, 1:10 PM

## 2023-12-15 NOTE — Progress Notes (Signed)
   Subjective: 1 Day Post-Op Procedure(s) (LRB): ARTHROPLASTY, HIP, TOTAL, ANTERIOR APPROACH (Right) Patient reports pain as moderate.   Patient seen in rounds by Dr. Lequita Halt. Patient had issues with pain control last night, on chronic pain meds so it was known that control initially would be problematic. We will see how he does today from a pain standpoint. Denies chest pain/Sob We will begin therapy today.   Objective: Vital signs in last 24 hours: Temp:  [97.4 F (36.3 C)-98 F (36.7 C)] 97.6 F (36.4 C) (04/08 0658) Pulse Rate:  [88-105] 98 (04/08 0658) Resp:  [12-19] 18 (04/08 0658) BP: (124-159)/(74-103) 149/84 (04/08 0658) SpO2:  [96 %-100 %] 96 % (04/08 0658) Weight:  [89 kg] 89 kg (04/07 1308)  Intake/Output from previous day:  Intake/Output Summary (Last 24 hours) at 12/15/2023 0840 Last data filed at 12/15/2023 0600 Gross per 24 hour  Intake 2368.75 ml  Output 5150 ml  Net -2781.25 ml     Intake/Output this shift: No intake/output data recorded.  Labs: Recent Labs    12/15/23 0335  HGB 13.7   Recent Labs    12/15/23 0335  WBC 13.5*  RBC 4.34  HCT 39.9  PLT 245   Recent Labs    12/15/23 0335  NA 135  K 4.6  CL 101  CO2 26  BUN 8  CREATININE 0.83  GLUCOSE 131*  CALCIUM 9.3   No results for input(s): "LABPT", "INR" in the last 72 hours.  Exam: General - Patient is Alert and Oriented Extremity - Neurologically intact Neurovascular intact Sensation intact distally Dorsiflexion/Plantar flexion intact Dressing - dressing C/D/I Motor Function - intact, moving foot and toes well on exam.   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   Venous insufficiency     Assessment/Plan: 1 Day Post-Op Procedure(s) (LRB): ARTHROPLASTY, HIP, TOTAL, ANTERIOR APPROACH (Right) Principal Problem:   Primary osteoarthritis of right hip  Estimated body mass index is 27.37 kg/m as calculated from the following:   Height as of this encounter: 5\' 11"  (1.803 m).   Weight as of this encounter: 89 kg. Advance diet Up with therapy D/C IV fluids  DVT Prophylaxis - Aspirin Weight bearing as tolerated. Begin therapy.  Plan is to go Home after hospital stay. Possible discharge later today if pain improves with HEP  The PDMP database was reviewed today prior to any opioid medications being prescribed to this patient.   Arther Abbott, PA-C Orthopedic Surgery [phone] 12/15/2023, 8:40 AM

## 2023-12-16 NOTE — Discharge Summary (Signed)
 Physician Discharge Summary   Patient ID: MARKEITH JUE MRN: [id number] DOB/AGE: [birth year] 68 y.o.  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   Venous insufficiency    Discharge Diagnoses:   Principal Problem:   Primary osteoarthritis of right hip  Estimated body mass index is 27.37 kg/m as calculated from the following:   Height as of this encounter: 5\' 11"  (1.803 m).   Weight as of this encounter: 89 kg.  Procedure:  Procedure(s) (LRB): ARTHROPLASTY, HIP, TOTAL, ANTERIOR APPROACH (Right)   Consults: None  HPI: Philip Gates is a 68 y.o. male who has advanced end-stage arthritis of their Right  hip with progressively worsening pain and dysfunction.The patient has failed nonoperative management and presents for total hip arthroplasty.  Laboratory Data: Admission on 12/14/2023, Discharged on 12/15/2023  Component Date Value Ref Range Status   WBC 12/15/2023 13.5 (H)  4.0 - 10.5 K/uL Final   RBC 12/15/2023 4.34  4.22 - 5.81 MIL/uL Final   Hemoglobin 12/15/2023 13.7  13.0 - 17.0 g/dL Final   HCT 40/98/1191 39.9  39.0 - 52.0 % Final   MCV 12/15/2023 91.9  80.0 - 100.0 fL Final   MCH 12/15/2023 31.6   26.0 - 34.0 pg Final   MCHC 12/15/2023 34.3  30.0 - 36.0 g/dL Final   RDW 47/82/9562 12.7  11.5 - 15.5 % Final   Platelets 12/15/2023 245  150 - 400 K/uL Final   nRBC 12/15/2023 0.0  0.0 - 0.2 % Final   Performed at [facility], [address] [place], [place] [address]   Sodium 12/15/2023 135  135 - 145 mmol/L Final   Potassium 12/15/2023 4.6  3.5 - 5.1 mmol/L Final   Chloride 12/15/2023 101  98 - 111 mmol/L Final   CO2 12/15/2023 26  22 - 32 mmol/L Final   Glucose, Bld 12/15/2023 131 (H)  70 - 99 mg/dL Final   Glucose reference range applies only to samples taken after fasting for at least 8 hours.   BUN 12/15/2023 8  8 - 23 mg/dL Final   Creatinine, Ser 12/15/2023 0.83  0.61 - 1.24 mg/dL Final   Calcium 57/84/6962 9.3  8.9 - 10.3 mg/dL Final   GFR, Estimated 12/15/2023 >60  >60 mL/min Final   Comment: (NOTE) Calculated using the CKD-EPI Creatinine Equation (2021)    Anion gap 12/15/2023 8  5 - 15 Final   Performed at [facility], [address] [place], [place] [address]  Hospital Outpatient  Visit on 12/04/2023  Component Date Value Ref Range Status   MRSA, PCR 12/04/2023 NEGATIVE  NEGATIVE Final   Staphylococcus aureus 12/04/2023 NEGATIVE  NEGATIVE Final   Comment: (NOTE) The Xpert SA Assay (FDA approved for NASAL specimens in patients 35 years of age and older), is one component of a comprehensive surveillance program. It is not intended to diagnose infection nor to guide or monitor treatment. Performed at [facility], [address], [place], [place] [address]    Sodium 12/04/2023 133 (L)  135 - 145 mmol/L Final   Potassium 12/04/2023 4.0  3.5 - 5.1 mmol/L Final   Chloride 12/04/2023 101  98 - 111 mmol/L Final   CO2 12/04/2023 23  22 - 32 mmol/L Final   Glucose, Bld 12/04/2023 97  70 - 99 mg/dL Final   Glucose reference range applies only to samples taken after fasting for at least 8 hours.   BUN 12/04/2023 13   8 - 23 mg/dL Final   Creatinine, Ser 12/04/2023 1.09  0.61 - 1.24 mg/dL Final   Calcium 60/45/4098 9.8  8.9 - 10.3 mg/dL Final   GFR, Estimated 12/04/2023 >60  >60 mL/min Final   Comment: (NOTE) Calculated using the CKD-EPI Creatinine Equation (2021)    Anion gap 12/04/2023 9  5 - 15 Final   Performed at [facility], [address] [place], [place] [address]   WBC 12/04/2023 9.3  4.0 - 10.5 K/uL Final   RBC 12/04/2023 4.78  4.22 - 5.81 MIL/uL Final   Hemoglobin 12/04/2023 15.2  13.0 - 17.0 g/dL Final   HCT 78/29/5621 44.1  39.0 - 52.0 % Final   MCV 12/04/2023 92.3  80.0 - 100.0 fL Final   MCH 12/04/2023 31.8  26.0 - 34.0 pg Final   MCHC 12/04/2023 34.5  30.0 - 36.0 g/dL Final   RDW 30/86/5784 12.8  11.5 - 15.5 % Final   Platelets 12/04/2023 271  150 - 400 K/uL Final   nRBC 12/04/2023 0.0  0.0 - 0.2 % Final   Performed at [facility], [address] [place], [place] [address]   ABO/RH(D) 12/04/2023 AB POS   Final   Antibody Screen 12/04/2023 NEG   Final   Sample Expiration 12/04/2023 12/17/2023,2359   Final   Extend sample reason 12/04/2023    Final                   Value:NO TRANSFUSIONS OR PREGNANCY IN THE PAST 3 MONTHS Performed at [facility], [address], [place], [place] [address]      X-Rays:DG HIP UNILAT WITH PELVIS 1V RIGHT Result Date: 12/14/2023 CLINICAL DATA:  Elective surgery. EXAM: DG HIP (WITH OR WITHOUT PELVIS) 1V RIGHT COMPARISON:  None Available. FINDINGS: Two fluoroscopic spot views of the pelvis and right hip obtained in the operating room. Images during hip arthroplasty. Fluoroscopy time 10 seconds. Dose 1.3264 mGy. Left hip arthroplasty partially included. IMPRESSION: Intraoperative fluoroscopy during right hip arthroplasty. Electronically Signed   By: Narda Rutherford M.D.   On: 12/14/2023 16:10   DG C-Arm 1-60 Min-No Report Result Date: 12/14/2023 Fluoroscopy was utilized by the requesting  physician.  No radiographic interpretation.   DG C-Arm 1-60 Min-No Report Result Date: 12/14/2023 Fluoroscopy was utilized by the requesting physician.  No radiographic interpretation.    EKG: Orders placed or performed during the hospital encounter of 07/17/23   EKG 12 lead per protocol   EKG 12 lead per protocol  Hospital Course: Philip Gates is a 68 y.o. who was admitted to [facility]. They were brought to the operating room on 12/14/2023 and underwent Procedure(s): ARTHROPLASTY, HIP, TOTAL, ANTERIOR APPROACH.  Patient tolerated the procedure well and was later transferred to the recovery room and then to the orthopaedic floor for postoperative care. They were given PO and IV analgesics for pain control following their surgery. They were given 24 hours of postoperative antibiotics of  Anti-infectives (From admission, onward)    Start     Dose/Rate Route Frequency Ordered Stop   12/14/23 2000  ceFAZolin (ANCEF) IVPB 2g/100 mL premix        2 g 200 mL/hr over 30 Minutes Intravenous Every 6 hours 12/14/23 1758 12/15/23 1219   12/14/23 1300  ceFAZolin (ANCEF) IVPB 2g/100 mL premix        2 g 200 mL/hr over 30 Minutes Intravenous On call to O.R. 12/14/23 1248 12/14/23 1437     and started on DVT prophylaxis in the form of Aspirin.   PT and OT were ordered for total joint protocol. Discharge planning consulted to help with postop disposition and equipment needs.  Patient had a fair night on the evening of surgery. Patient was seen during rounds and was ready to go home pending progress with therapy. Patient started to work with physical therapy on POD #1 and was meeting his goals. Pt was discharged to home later that day in stable condition.  Diet: Regular diet Activity: WBAT Follow-up: in 2 weeks Disposition: Home Discharged Condition: stable   Discharge Instructions     Call MD / Call 911   Complete by: As directed    If you experience chest pain or shortness of  breath, CALL 911 and be transported to the hospital emergency room.  If you develope a fever above 101 F, pus (white drainage) or increased drainage or redness at the wound, or calf pain, call your surgeon's office.   Change dressing   Complete by: As directed    You have an adhesive waterproof bandage over the incision. Leave this in place until your first follow-up appointment. Once you remove this you will not need to place another bandage.   Constipation Prevention   Complete by: As directed    Drink plenty of fluids.  Prune juice may be helpful.  You may use a stool softener, such as Colace (over the counter) 100 mg twice a day.  Use MiraLax (over the counter) for constipation as needed.   Diet - low sodium heart healthy   Complete by: As directed    Do not sit on low chairs, stoools or toilet seats, as it may be difficult to get up from low surfaces   Complete by: As directed    Driving restrictions   Complete by: As directed    No driving for two weeks   Post-operative opioid taper instructions:   Complete by: As directed    POST-OPERATIVE OPIOID TAPER INSTRUCTIONS: It is important to wean off of your opioid medication as soon as possible. If you do not need pain medication after your surgery it is ok to stop day one. Opioids include: Codeine, Hydrocodone(Norco, Vicodin), Oxycodone(Percocet, oxycontin) and hydromorphone amongst others.  Long term and even short term use of opiods can cause: Increased pain response Dependence Constipation Depression Respiratory depression And more.  Withdrawal symptoms can include Flu like symptoms Nausea, vomiting And more Techniques to manage these symptoms Hydrate well Eat regular healthy meals Stay active  Use relaxation techniques(deep breathing, meditating, yoga) Do Not substitute Alcohol to help with tapering If you have been on opioids for less than two weeks and do not have pain than it is ok to stop all together.  Plan to wean off  of opioids This plan should start within one week post op of your joint replacement. Maintain the same interval or time between taking each dose and first decrease the dose.  Cut the total daily intake of opioids by one tablet each day Next start to increase the time between doses. The last dose that should be eliminated is the evening dose.      TED hose   Complete by: As directed    Use stockings (TED hose) for three weeks on both leg(s).  You may remove them at night for sleeping.   Weight bearing as tolerated   Complete by: As directed       Allergies as of 12/15/2023       Reactions   Naltrexone Anaphylaxis   Clindamycin Other (See Comments)   Abdominal pain "twisted stomach"   Erythromycin    Caused a "twisted stomach"   Penicillins Rash        Medication List     STOP taking these medications    aspirin EC 81 MG tablet Replaced by: Aspirin Low Dose 81 MG chewable tablet   ibuprofen 800 MG tablet Commonly known as: ADVIL       TAKE these medications    Aspirin Low Dose 81 MG chewable tablet Generic drug: aspirin Chew 1 tablet (81 mg total) by mouth 2 (two) times daily for 21 days. Then resume one 81 mg aspirin once a day Replaces: aspirin EC 81 MG tablet   calcium carbonate 750 MG chewable tablet Commonly known as: TUMS EX Chew 1 tablet by mouth 2 (two) times daily as needed for indigestion or heartburn.   chlorpheniramine 4 MG tablet Commonly known as: CHLOR-TRIMETON Take 4 mg by mouth 2 (two) times daily as needed for allergies.   clindamycin-benzoyl peroxide gel Commonly known as: BENZACLIN Apply 1 Application topically at bedtime.   cyanocobalamin 1000 MCG tablet Commonly known as: VITAMIN B12 Take 1,000 mcg by mouth daily.   ELDERBERRY/VITAMIN C/ZINC PO Take 1-2 tablets by mouth daily.   famotidine 20 MG tablet Commonly known as: PEPCID Take 20 mg by mouth in the morning and at bedtime.   HYDROcodone-acetaminophen 10-325 MG  tablet Commonly known as: NORCO Take 1 tablet by mouth every 4 (four) hours as needed for moderate pain (pain score 4-6).   HYDROmorphone 2 MG tablet Commonly known as: DILAUDID Take 1-2 tablets (2-4 mg total) by mouth every 6 (six) hours as needed for severe pain (pain score 7-10) (breakthrough pain not responding to chronic hydrocodone).   ipratropium 0.03 % nasal spray Commonly known as: ATROVENT Place 1 spray into both nostrils 2 (two) times daily as needed for rhinitis.   iVIZIA Dry Eyes 0.5 % Soln Generic drug: Povidone (PF) Place 1 drop into both eyes as needed (DRY EYES).   lamoTRIgine 200 MG tablet Commonly known as: LAMICTAL Take 1 tablet (200 mg total) by mouth at bedtime.   lansoprazole 30 MG capsule Commonly known as: PREVACID Take 30 mg by mouth 2 (two) times daily.   lisdexamfetamine 70 MG capsule Commonly known as: Vyvanse Take 1 capsule (70 mg total) by mouth daily.   lisdexamfetamine 70 MG capsule Commonly known as: Vyvanse Take 1 capsule (70 mg total) by mouth daily.  lisdexamfetamine 70 MG capsule Commonly known as: Vyvanse Take 1 capsule (70 mg total) by mouth daily.   magic mouthwash (nystatin, hydrocortisone, diphenhydrAMINE, lidocaine) suspension Swish and swallow 5 mLs daily as needed for mouth pain (throat pain).   methocarbamol 500 MG tablet Commonly known as: ROBAXIN Take 1 tablet (500 mg total) by mouth every 6 (six) hours as needed for muscle spasms. What changed:  medication strength how much to take when to take this   metoprolol succinate 100 MG 24 hr tablet Commonly known as: TOPROL-XL Take 100 mg by mouth at bedtime.   montelukast 10 MG tablet Commonly known as: SINGULAIR Take 10 mg by mouth at bedtime.   multivitamin with minerals tablet Take 1 tablet by mouth daily. Multivitamin for Women (needs the iron)   nystatin ointment Commonly known as: MYCOSTATIN Apply 1 Application topically daily as needed (irritation). Apply  to outside corner of lips   ondansetron 4 MG tablet Commonly known as: ZOFRAN Take 1 tablet (4 mg total) by mouth every 6 (six) hours as needed for nausea.   OVER THE COUNTER MEDICATION Take 1 tablet by mouth daily. Cramp defence supplement   Pataday 0.1 % ophthalmic solution Generic drug: olopatadine Place 1 drop into both eyes 2 (two) times daily as needed for allergies.   ProAir HFA 108 (90 Base) MCG/ACT inhaler Generic drug: albuterol Inhale 1-2 puffs into the lungs every 6 (six) hours as needed for shortness of breath or wheezing.   PROBIOTIC GUMMIES PO Take 1 each by mouth daily.   promethazine 25 MG tablet Commonly known as: PHENERGAN Take 25 mg by mouth every 6 (six) hours as needed for nausea or vomiting.   tadalafil 20 MG tablet Commonly known as: CIALIS Take 20 mg by mouth at bedtime.   telmisartan 80 MG tablet Commonly known as: MICARDIS Take 80 mg by mouth daily.   testosterone cypionate 200 MG/ML injection Commonly known as: DEPOTESTOSTERONE CYPIONATE Inject 200 mg into the muscle every 14 (fourteen) days.   vitamin C 1000 MG tablet Take 1,000 mg by mouth 2 (two) times daily. With Zinc (20 mg)   VITAMIN D PO Take 1 capsule by mouth daily.               Discharge Care Instructions  (From admission, onward)           Start     Ordered   12/15/23 0000  Weight bearing as tolerated        12/15/23 1517   12/15/23 0000  Change dressing       Comments: You have an adhesive waterproof bandage over the incision. Leave this in place until your first follow-up appointment. Once you remove this you will not need to place another bandage.   12/15/23 1517            Follow-up Information     Ollen Gross, MD. Schedule an appointment as soon as possible for a visit in 2 week(s).   Specialty: Orthopedic Surgery Contact information: [address] [place] [address] [place] [address] [phone]                 Signed: R. Arcola Jansky, PA-C Orthopedic Surgery 12/16/2023, 8:03 AM

## 2024-02-24 ENCOUNTER — Other Ambulatory Visit (HOSPITAL_COMMUNITY): Payer: Self-pay | Admitting: Psychiatry

## 2024-04-01 ENCOUNTER — Telehealth (HOSPITAL_COMMUNITY): Payer: Self-pay

## 2024-04-01 ENCOUNTER — Other Ambulatory Visit (HOSPITAL_COMMUNITY): Payer: Self-pay | Admitting: Psychiatry

## 2024-04-01 MED ORDER — LISDEXAMFETAMINE DIMESYLATE 70 MG PO CAPS: 70.0000 mg | ORAL_CAPSULE | Freq: Every day | ORAL | 0 refills | Status: DC

## 2024-04-01 NOTE — Telephone Encounter (Signed)
 Pt called in requesting a refill on his lisdexamfetamine (VYVANSE ) 70 MG capsule sent to [facility]. Pt scheduled 04/06/24. Please advise.

## 2024-04-01 NOTE — Telephone Encounter (Signed)
 sent

## 2024-04-01 NOTE — Telephone Encounter (Signed)
 Spoke with pt advised rx has been sent to [facility] he verbalized understanding

## 2024-04-06 ENCOUNTER — Encounter (HOSPITAL_COMMUNITY): Payer: Self-pay | Admitting: Psychiatry

## 2024-04-06 ENCOUNTER — Telehealth (HOSPITAL_COMMUNITY): Admitting: Psychiatry

## 2024-04-06 DIAGNOSIS — F900 Attention-deficit hyperactivity disorder, predominantly inattentive type: Secondary | ICD-10-CM | POA: Diagnosis not present

## 2024-04-06 DIAGNOSIS — F3162 Bipolar disorder, current episode mixed, moderate: Secondary | ICD-10-CM

## 2024-04-06 MED ORDER — LISDEXAMFETAMINE DIMESYLATE 70 MG PO CAPS: 70.0000 mg | ORAL_CAPSULE | Freq: Every day | ORAL | 0 refills | Status: DC

## 2024-04-06 MED ORDER — LAMOTRIGINE 200 MG PO TABS: 200.0000 mg | ORAL_TABLET | Freq: Every day | ORAL | 2 refills | Status: DC

## 2024-04-06 NOTE — Progress Notes (Signed)
 Virtual Visit via Telephone Note  I connected with Philip Gates on 04/06/24 at  9:40 AM EDT by telephone and verified that I am speaking with the correct person using two identifiers.  Location: Patient: home Provider: office   I discussed the limitations, risks, security and privacy concerns of performing an evaluation and management service by telephone and the availability of in person appointments. I also discussed with the patient that there may be a patient responsible charge related to this service. The patient expressed understanding and agreed to proceed.       I discussed the assessment and treatment plan with the patient. The patient was provided an opportunity to ask questions and all were answered. The patient agreed with the plan and demonstrated an understanding of the instructions.   The patient was advised to call back or seek an in-person evaluation if the symptoms worsen or if the condition fails to improve as anticipated.  I provided 20 minutes of non-face-to-face time during this encounter.   Barnie Gull, MD  [facility] MD/PA/NP OP Progress Note  04/06/2024 10:01 AM Philip Gates  MRN:  [id number]  Chief Complaint:  Chief Complaint  Patient presents with   ADHD   Manic Behavior   Follow-up   HPI: :This patient is a 68 year old married white male who lives with his wife in [place]. 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  He states that he is generally doing well.  He had right hip replacement in April and is still recovering but is doing much better and his mobility is improving.  He is focusing well with the Vyvanse  70 mg every morning.  He denies significant depression mood swings irritability or anger spells.  He is sleeping well.  The Lamictal  seems to be working for the symptoms. Visit Diagnosis:    ICD-10-CM   1.  Attention deficit hyperactivity disorder (ADHD), predominantly inattentive type  F90.0     2. 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   Venous insufficiency     Past Surgical History:  Procedure Laterality Date   BACK SURGERY  12/07/2020   cerebral stent     COLONOSCOPY N/A 01/02/2017   Procedure: COLONOSCOPY;  Surgeon: Golda Claudis PENNER, MD;  [facility]: [facility];  Service: Endoscopy;  Laterality: N/A;   dental implant  09/26/2014   upper and lower dental implant/bridges- permanent   ESOPHAGOGASTRODUODENOSCOPY N/A 01/02/2017   Procedure: ESOPHAGOGASTRODUODENOSCOPY (EGD);  Surgeon: Golda Claudis PENNER, MD;  Location: [facility];  Service: Endoscopy;  Laterality: N/A;  910   EYE SURGERY Left 09/24/2015   Cataract   IR ANGIO INTRA EXTRACRAN SEL INTERNAL CAROTID BILAT MOD SED  01/20/2017   IR ANGIO VERTEBRAL SEL VERTEBRAL UNI L MOD SED  01/20/2017   JOINT REPLACEMENT Right    Knee   JOINT REPLACEMENT Left    Hip   NOSE SURGERY     RADIOLOGY WITH ANESTHESIA N/A 12/29/2014   Procedure: Embolization;  Surgeon: Gerldine Maizes, MD;  Location: [facility] OR;  Service: Radiology;  Laterality: N/A;    SUBCLAVIAN ANGIOGRAM  07/17/2015   TOTAL HIP ARTHROPLASTY Left 01/03/2013   Procedure: TOTAL HIP ARTHROPLASTY ANTERIOR APPROACH;  Surgeon: Dempsey LULLA Moan, MD;  Location: [facility] ORS;  Service: Orthopedics;  Laterality: Left;   TOTAL HIP ARTHROPLASTY Right 12/14/2023   Procedure: ARTHROPLASTY, HIP, TOTAL, ANTERIOR APPROACH;  Surgeon: Moan Dempsey, MD;  Location: [facility];  Service: Orthopedics;  Laterality: Right;   TOTAL KNEE ARTHROPLASTY Right 10/06/2014   Procedure: RIGHT TOTAL KNEE ARTHROPLASTY;  Surgeon: Dempsey Moan LULLA, MD;  Location: [facility];  Service: Orthopedics;  Laterality: Right;   TRANSFORAMINAL LUMBAR INTERBODY FUSION (TLIF) WITH PEDICLE SCREW FIXATION 1 LEVEL Right 07/24/2023   Procedure: Transforaminal Lumbar Interbody Fusion Lumbar Five-Sacral One;  Surgeon: Maizes Gerldine, MD;  Location: [facility] OR;  Service: Neurosurgery;  Laterality: Right;  3C   WISDOM TOOTH EXTRACTION      Family Psychiatric History: See below  Family History:  Family History  Problem Relation Age of Onset   Arrhythmia Mother        Has pacemaker   Hypertension Mother    Heart disease Mother    Mental illness Mother    Varicose Veins Mother    Depression Mother    Colon cancer Father    Cancer Father        Prostate and Colon   Diabetes Father    Hypertension Father    Cancer - Colon Father    Depression Sister     Social History:  Social History   Socioeconomic History   Marital status: Married    Spouse name: Not on file   Number of children: Not on file   Years of education: Not on file   Highest education level: Not on file  Occupational History   Not on file  Tobacco Use   Smoking status: Former    Current packs/day: 0.00    Average packs/day: 0.5 packs/day for 2.4 years (1.2 ttl pk-yrs)    Types: Cigarettes    Start date: 05/23/2015    Quit date: 10/09/2017    Years since quitting: 6.4   Smokeless tobacco: Former    Types: Snuff   Tobacco comments:    Stated I smoke off and on   Vaping Use   Vaping status: Never Used  Substance and Sexual Activity   Alcohol use: Not Currently    Comment: last had fireball last night 09/10/21   Drug use: No   Sexual activity: Yes    Partners: Female  Other Topics Concern   Not on file  Social History Narrative   Not on file   Social Drivers of Health   Financial Resource Strain: Medium Risk (03/22/2024)   Received from [facility]   Overall Financial Resource Strain (CARDIA)    How  hard is it for you to pay for the very basics like food, housing, medical care, and heating?: Somewhat hard  Food Insecurity: No Food Insecurity (03/22/2024)   [facility]   Hunger Vital Sign    Within the past 12 months, you worried that your food would run out before you got the money to buy more.: Never true    Within the past 12 months, the food you bought just didn't last and you didn't have money to get more.: Never true  Transportation Needs: No Transportation Needs (03/22/2024)   Received from [facility] - Transportation    In the past 12 months, has lack of transportation kept you from medical appointments or from getting medications?: No    In the past 12 months, has lack of transportation kept you from meetings, work, or from getting things needed for daily living?: No  Physical Activity: Inactive (03/22/2024)   Received from [facility]   Exercise Vital Sign    On average, how many days per week do you engage in moderate to strenuous exercise (like a brisk walk)?: 0 days    Minutes of Exercise per Session: Not on file  Stress: No Stress Concern Present (03/22/2024)   Received from [facility] - Occupational Stress Questionnaire    Do you feel stress - tense, restless, nervous, or anxious, or unable to sleep at night because your mind is troubled all the time - these days?: Not at all  Social Connections: Socially Integrated (03/22/2024)   Received from [facility]   Social Network    How would you rate your social network (family, work, friends)?: Good participation with social networks    Allergies:  Allergies  Allergen Reactions   Naltrexone Anaphylaxis   Clindamycin Other (See Comments)    Abdominal pain twisted stomach   Erythromycin     Caused a twisted stomach   Penicillins Rash    Metabolic Disorder Labs: No results found for: HGBA1C, MPG No results found for: PROLACTIN No results found for: CHOL, TRIG, HDL, CHOLHDL, VLDL, LDLCALC No results found for: TSH  Therapeutic Level Labs: No results found for: LITHIUM No results found for: VALPROATE No results found for: CBMZ  Current Medications: Current Outpatient Medications  Medication Sig Dispense Refill   Ascorbic Acid (VITAMIN C) 1000 MG tablet Take 1,000 mg by mouth 2 (two) times daily. With Zinc (20 mg)     calcium  carbonate (TUMS EX) 750 MG chewable tablet Chew 1 tablet by mouth 2 (two) times daily as needed for indigestion or heartburn.     chlorpheniramine (CHLOR-TRIMETON) 4 MG tablet Take 4 mg by mouth 2 (two) times daily as needed for allergies.     clindamycin-benzoyl peroxide (BENZACLIN) gel Apply 1 Application topically at bedtime.     cyanocobalamin (VITAMIN B12) 1000 MCG tablet Take 1,000 mcg by mouth daily.     ELDERBERRY/VITAMIN C/ZINC PO Take 1-2 tablets by mouth daily.     famotidine  (PEPCID ) 20 MG tablet Take 20 mg by mouth in the morning and at bedtime.     HYDROmorphone  (DILAUDID ) 2 MG tablet Take 1-2 tablets (2-4 mg total) by mouth every 6 (six) hours as needed for severe pain (pain score 7-10) (breakthrough pain not responding to chronic hydrocodone ). 42 tablet 0   ipratropium (ATROVENT ) 0.03 % nasal spray Place 1 spray into both nostrils 2 (two) times daily as needed for rhinitis.  lamoTRIgine  (LAMICTAL ) 200 MG tablet Take 1 tablet (200 mg total) by mouth at bedtime. 30 tablet 2   lansoprazole (PREVACID) 30 MG capsule  Take 30 mg by mouth 2 (two) times daily.     lisdexamfetamine (VYVANSE ) 70 MG capsule Take 1 capsule (70 mg total) by mouth daily. 30 capsule 0   lisdexamfetamine (VYVANSE ) 70 MG capsule Take 1 capsule (70 mg total) by mouth daily. 30 capsule 0   lisdexamfetamine (VYVANSE ) 70 MG capsule Take 1 capsule (70 mg total) by mouth daily. 30 capsule 0   magic mouthwash (nystatin, hydrocortisone, diphenhydrAMINE , lidocaine ) suspension Swish and swallow 5 mLs daily as needed for mouth pain (throat pain).     methocarbamol  (ROBAXIN ) 500 MG tablet Take 1 tablet (500 mg total) by mouth every 6 (six) hours as needed for muscle spasms. 40 tablet 0   metoprolol  succinate (TOPROL -XL) 100 MG 24 hr tablet Take 100 mg by mouth at bedtime.     montelukast  (SINGULAIR ) 10 MG tablet Take 10 mg by mouth at bedtime.     Multiple Vitamins-Minerals (MULTIVITAMIN WITH MINERALS) tablet Take 1 tablet by mouth daily. Multivitamin for Women (needs the iron )     nystatin ointment (MYCOSTATIN) Apply 1 Application topically daily as needed (irritation). Apply to outside corner of lips     olopatadine  (PATADAY ) 0.1 % ophthalmic solution Place 1 drop into both eyes 2 (two) times daily as needed for allergies.     ondansetron  (ZOFRAN ) 4 MG tablet Take 1 tablet (4 mg total) by mouth every 6 (six) hours as needed for nausea. 20 tablet 0   OVER THE COUNTER MEDICATION Take 1 tablet by mouth daily. Cramp defence supplement     Povidone, PF, (IVIZIA DRY EYES) 0.5 % SOLN Place 1 drop into both eyes as needed (DRY EYES).     PROAIR  HFA 108 (90 Base) MCG/ACT inhaler Inhale 1-2 puffs into the lungs every 6 (six) hours as needed for shortness of breath or wheezing.     Probiotic Product (PROBIOTIC GUMMIES PO) Take 1 each by mouth daily.     promethazine  (PHENERGAN ) 25 MG tablet Take 25 mg by mouth every 6 (six) hours as needed for nausea or vomiting.     tadalafil (CIALIS) 20 MG tablet Take 20 mg by mouth at bedtime.     telmisartan (MICARDIS) 80  MG tablet Take 80 mg by mouth daily.     testosterone  cypionate (DEPOTESTOTERONE CYPIONATE) 200 MG/ML injection Inject 200 mg into the muscle every 14 (fourteen) days.      VITAMIN D PO Take 1 capsule by mouth daily.     No current facility-administered medications for this visit.     Musculoskeletal: Strength & Muscle Tone: na Gait & Station: na Patient leans: N/A  Psychiatric Specialty Exam: Review of Systems  Musculoskeletal:  Positive for arthralgias and back pain.  All other systems reviewed and are negative.   There were no vitals taken for this visit.There is no height or weight on file to calculate BMI.  General Appearance: NA  Eye Contact:  NA  Speech:  Clear and Coherent  Volume:  Normal  Mood:  Euthymic  Affect:  Congruent  Thought Process:  Goal Directed  Orientation:  Full (Time, Place, and Person)  Thought Content: WDL   Suicidal Thoughts:  No  Homicidal Thoughts:  No  Memory:  Immediate;   Good Recent;   Good Remote;   Good  Judgement:  Good  Insight:  Fair  Psychomotor Activity:  Normal  Concentration:  Concentration: Good and Attention Span: Good  Recall:  Good  Fund of Knowledge: Good  Language: Good  Akathisia:  No  Handed:  Right  AIMS (if indicated): not done  Assets:  Communication Skills Desire for Improvement Resilience Social Support Talents/Skills  ADL's:  Intact  Cognition: WNL  Sleep:  Good   Screenings: PHQ2-9    Flowsheet Row Video Visit from 05/06/2022 in [facility] [place] Video Visit from 02/04/2022 in [facility] [place] Video Visit from 08/14/2021 in [facility] at [place] Video Visit from 05/20/2021 in [facility] Total Score 0 0 0 0   Flowsheet Row Admission (Discharged) from 12/14/2023 in [place] [facility] Admission (Discharged) from 07/24/2023 in [facility] UC from 03/15/2023 in [facility] RISK CATEGORY No Risk No Risk No Risk     Assessment and Plan: This patient is a 68 year old male with a history of bipolar disorder and ADHD.  He continues to do well on his current regimen.  He will continue Vyvanse  70 mg every morning for ADHD and Lamictal  200 mg daily for mood stabilization.  He will return to see me in 3 months  Collaboration of Care: Collaboration of Care: Primary Care Provider AEB notes are shared with PCP on the epic system  Patient/Guardian was advised Release of Information must be obtained prior to any record release in order to collaborate their care with an outside provider. Patient/Guardian was advised if they have not already done so to contact the registration department to sign all necessary forms in order for us  to release information regarding their care.   Consent: Patient/Guardian gives verbal consent for treatment and assignment of benefits for services provided during this visit. Patient/Guardian expressed understanding and agreed to proceed.    Barnie Gull, MD 04/06/2024, 10:01 AM

## 2024-05-13 ENCOUNTER — Other Ambulatory Visit (HOSPITAL_COMMUNITY): Payer: Self-pay

## 2024-07-07 ENCOUNTER — Encounter (HOSPITAL_COMMUNITY): Payer: Self-pay | Admitting: Psychiatry

## 2024-07-07 ENCOUNTER — Telehealth (INDEPENDENT_AMBULATORY_CARE_PROVIDER_SITE_OTHER): Admitting: Psychiatry

## 2024-07-07 DIAGNOSIS — F3162 Bipolar disorder, current episode mixed, moderate: Secondary | ICD-10-CM | POA: Diagnosis not present

## 2024-07-07 DIAGNOSIS — F900 Attention-deficit hyperactivity disorder, predominantly inattentive type: Secondary | ICD-10-CM

## 2024-07-07 MED ORDER — LISDEXAMFETAMINE DIMESYLATE 70 MG PO CAPS: 70.0000 mg | ORAL_CAPSULE | Freq: Every day | ORAL | 0 refills | Status: DC

## 2024-07-07 MED ORDER — LAMOTRIGINE 200 MG PO TABS: 200.0000 mg | ORAL_TABLET | Freq: Every day | ORAL | 2 refills | Status: DC

## 2024-07-07 NOTE — Progress Notes (Signed)
 Virtual Visit via Telephone Note  I connected with Philip Gates on 07/07/24 at  9:40 AM EDT by telephone and verified that I am speaking with the correct person using two identifiers.  Location: Patient: home Provider: office   I discussed the limitations, risks, security and privacy concerns of performing an evaluation and management service by telephone and the availability of in person appointments. I also discussed with the patient that there may be a patient responsible charge related to this service. The patient expressed understanding and agreed to proceed.       I discussed the assessment and treatment plan with the patient. The patient was provided an opportunity to ask questions and all were answered. The patient agreed with the plan and demonstrated an understanding of the instructions.   The patient was advised to call back or seek an in-person evaluation if the symptoms worsen or if the condition fails to improve as anticipated.  I provided 20 minutes of non-face-to-face time during this encounter.   Barnie Gull, MD  [facility] MD/PA/NP OP Progress Note  07/07/2024 10:06 AM Philip Gates  MRN:  [id number]  Chief Complaint:  Chief Complaint  Patient presents with   ADD   Manic Behavior   Follow-up   HPI: This patient is a 68 year old married white male lives with his wife in [place].  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  The patient returns for follow-up after 3 months regarding his ADHD and bipolar disorder.  He continues to do well.  He states that his mood is stable and he denies being depressed or anxious.  He is sleeping well.  He has had to have some treatment for his varicose veins but other than that has been pretty healthy.  He has been able to play golf.  He denies significant irritability or anger.  He is focusing well. Visit Diagnosis:    ICD-10-CM   1. Attention deficit hyperactivity disorder  (ADHD), predominantly inattentive type  F90.0     2. 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   Venous insufficiency     Past Surgical History:  Procedure Laterality Date   BACK SURGERY  12/07/2020   cerebral stent     COLONOSCOPY N/A 01/02/2017   Procedure: COLONOSCOPY;  Surgeon: Golda Claudis PENNER, MD;  [facility]: [facility];  Service: Endoscopy;  Laterality: N/A;   dental implant  09/26/2014   upper and  lower dental implant/bridges- permanent   ESOPHAGOGASTRODUODENOSCOPY N/A 01/02/2017   Procedure: ESOPHAGOGASTRODUODENOSCOPY (EGD);  Surgeon: Golda Claudis PENNER, MD;  Location: [facility];  Service: Endoscopy;  Laterality: N/A;  910   EYE SURGERY Left 09/24/2015   Cataract   IR ANGIO INTRA EXTRACRAN SEL INTERNAL CAROTID BILAT MOD SED  01/20/2017   IR ANGIO VERTEBRAL SEL VERTEBRAL UNI L MOD SED  01/20/2017   JOINT REPLACEMENT Right    Knee   JOINT REPLACEMENT Left    Hip   NOSE SURGERY     RADIOLOGY WITH ANESTHESIA N/A 12/29/2014   Procedure: Embolization;  Surgeon: Gerldine Maizes, MD;  Location: [facility] OR;  Service: Radiology;  Laterality: N/A;   SUBCLAVIAN ANGIOGRAM  07/17/2015   TOTAL HIP  ARTHROPLASTY Left 01/03/2013   Procedure: TOTAL HIP ARTHROPLASTY ANTERIOR APPROACH;  Surgeon: Dempsey LULLA Moan, MD;  Location: [facility];  Service: Orthopedics;  Laterality: Left;   TOTAL HIP ARTHROPLASTY Right 12/14/2023   Procedure: ARTHROPLASTY, HIP, TOTAL, ANTERIOR APPROACH;  Surgeon: Moan Dempsey, MD;  Location: [facility];  Service: Orthopedics;  Laterality: Right;   TOTAL KNEE ARTHROPLASTY Right 10/06/2014   Procedure: RIGHT TOTAL KNEE ARTHROPLASTY;  Surgeon: Dempsey Moan LULLA, MD;  Location: [facility];  Service: Orthopedics;  Laterality: Right;   TRANSFORAMINAL LUMBAR INTERBODY FUSION (TLIF) WITH PEDICLE SCREW FIXATION 1 LEVEL Right 07/24/2023   Procedure: Transforaminal Lumbar Interbody Fusion Lumbar Five-Sacral One;  Surgeon: Maizes Gerldine, MD;  Location: [facility] OR;  Service: Neurosurgery;  Laterality: Right;  3C   WISDOM TOOTH EXTRACTION      Family Psychiatric History: See below  Family History:  Family History  Problem Relation Age of Onset   Arrhythmia Mother        Has pacemaker   Hypertension Mother    Heart disease Mother    Mental illness Mother    Varicose Veins Mother    Depression Mother    Colon cancer Father    Cancer Father        Prostate and Colon   Diabetes Father    Hypertension Father    Cancer - Colon Father    Depression Sister     Social History:  Social History   Socioeconomic History   Marital status: Married    Spouse name: Not on file   Number of children: Not on file   Years of education: Not on file   Highest education level: Not on file  Occupational History   Not on file  Tobacco Use   Smoking status: Former    Current packs/day: 0.00    Average packs/day: 0.5 packs/day for 2.4 years (1.2 ttl pk-yrs)    Types: Cigarettes    Start date: 05/23/2015    Quit date: 10/09/2017    Years since quitting: 6.7   Smokeless tobacco: Former    Types: Snuff   Tobacco comments:    Stated I smoke off and on  Vaping Use   Vaping status: Never Used   Substance and Sexual Activity   Alcohol use: Not Currently    Comment: last had fireball last night 09/10/21   Drug use: No   Sexual activity: Yes    Partners: Female  Other Topics Concern   Not on file  Social History Narrative   Not on file   Social Drivers of Health   Financial Resource Strain: Medium Risk (03/22/2024)   Received from [facility]   Overall Financial Resource Strain (CARDIA)    How hard is it for you  to pay for the very basics like food, housing, medical care, and heating?: Somewhat hard  Food Insecurity: No Food Insecurity (03/22/2024)   Received from [facility]   Hunger Vital Sign    Within the past 12 months, you worried that your food would run out before you got the money to buy more.: Never true    Within the past 12 months, the food you bought just didn't last and you didn't have money to get more.: Never true  Transportation Needs: No Transportation Needs (03/22/2024)   Received from [facility] - Transportation    In the past 12 months, has lack of transportation kept you from medical appointments or from getting medications?: No    In the past 12 months, has lack of transportation kept you from meetings, work, or from getting things needed for daily living?: No  Physical Activity: Inactive (03/22/2024)   Received from [facility]   Exercise Vital Sign    On average, how many days per week do you engage in moderate to strenuous exercise (like a brisk walk)?: 0 days    Minutes of Exercise per Session: Not on file  Stress: No Stress Concern Present (03/22/2024)   Received from [facility] - Occupational Stress Questionnaire    Do you feel stress - tense, restless, nervous, or anxious, or unable to sleep at night because your mind is troubled all the time - these days?: Not at all  Social Connections: Socially Integrated (03/22/2024)   Received from [facility]   Social Network    How would you  rate your social network (family, work, friends)?: Good participation with social networks    Allergies:  Allergies  Allergen Reactions   Naltrexone Anaphylaxis   Clindamycin Other (See Comments)    Abdominal pain twisted stomach   Erythromycin     Caused a twisted stomach   Penicillins Rash    Metabolic Disorder Labs: No results found for: HGBA1C, MPG No results found for: PROLACTIN No results found for: CHOL, TRIG, HDL, CHOLHDL, VLDL, LDLCALC No results found for: TSH  Therapeutic Level Labs: No results found for: LITHIUM No results found for: VALPROATE No results found for: CBMZ  Current Medications: Current Outpatient Medications  Medication Sig Dispense Refill   Ascorbic Acid (VITAMIN C) 1000 MG tablet Take 1,000 mg by mouth 2 (two) times daily. With Zinc (20 mg)     calcium  carbonate (TUMS EX) 750 MG chewable tablet Chew 1 tablet by mouth 2 (two) times daily as needed for indigestion or heartburn.     chlorpheniramine (CHLOR-TRIMETON) 4 MG tablet Take 4 mg by mouth 2 (two) times daily as needed for allergies.     clindamycin-benzoyl peroxide (BENZACLIN) gel Apply 1 Application topically at bedtime.     cyanocobalamin (VITAMIN B12) 1000 MCG tablet Take 1,000 mcg by mouth daily.     ELDERBERRY/VITAMIN C/ZINC PO Take 1-2 tablets by mouth daily.     famotidine  (PEPCID ) 20 MG tablet Take 20 mg by mouth in the morning and at bedtime.     HYDROmorphone  (DILAUDID ) 2 MG tablet Take 1-2 tablets (2-4 mg total) by mouth every 6 (six) hours as needed for severe pain (pain score 7-10) (breakthrough pain not responding to chronic hydrocodone ). 42 tablet 0   ipratropium (ATROVENT ) 0.03 % nasal spray Place 1 spray into both nostrils 2 (two) times daily as needed for rhinitis.     lamoTRIgine  (LAMICTAL ) 200 MG tablet  Take 1 tablet (200 mg total) by mouth at bedtime. 30 tablet 2   lansoprazole (PREVACID) 30 MG capsule Take 30 mg by mouth 2 (two) times daily.      lisdexamfetamine (VYVANSE ) 70 MG capsule Take 1 capsule (70 mg total) by mouth daily. 30 capsule 0   lisdexamfetamine (VYVANSE ) 70 MG capsule Take 1 capsule (70 mg total) by mouth daily. 30 capsule 0   lisdexamfetamine (VYVANSE ) 70 MG capsule Take 1 capsule (70 mg total) by mouth daily. 30 capsule 0   magic mouthwash (nystatin, hydrocortisone, diphenhydrAMINE , lidocaine ) suspension Swish and swallow 5 mLs daily as needed for mouth pain (throat pain).     methocarbamol  (ROBAXIN ) 500 MG tablet Take 1 tablet (500 mg total) by mouth every 6 (six) hours as needed for muscle spasms. 40 tablet 0   metoprolol  succinate (TOPROL -XL) 100 MG 24 hr tablet Take 100 mg by mouth at bedtime.     montelukast  (SINGULAIR ) 10 MG tablet Take 10 mg by mouth at bedtime.     Multiple Vitamins-Minerals (MULTIVITAMIN WITH MINERALS) tablet Take 1 tablet by mouth daily. Multivitamin for Women (needs the iron )     nystatin ointment (MYCOSTATIN) Apply 1 Application topically daily as needed (irritation). Apply to outside corner of lips     olopatadine  (PATADAY ) 0.1 % ophthalmic solution Place 1 drop into both eyes 2 (two) times daily as needed for allergies.     ondansetron  (ZOFRAN ) 4 MG tablet Take 1 tablet (4 mg total) by mouth every 6 (six) hours as needed for nausea. 20 tablet 0   OVER THE COUNTER MEDICATION Take 1 tablet by mouth daily. Cramp defence supplement     Povidone, PF, (IVIZIA DRY EYES) 0.5 % SOLN Place 1 drop into both eyes as needed (DRY EYES).     PROAIR  HFA 108 (90 Base) MCG/ACT inhaler Inhale 1-2 puffs into the lungs every 6 (six) hours as needed for shortness of breath or wheezing.     Probiotic Product (PROBIOTIC GUMMIES PO) Take 1 each by mouth daily.     promethazine  (PHENERGAN ) 25 MG tablet Take 25 mg by mouth every 6 (six) hours as needed for nausea or vomiting.     tadalafil (CIALIS) 20 MG tablet Take 20 mg by mouth at bedtime.     telmisartan (MICARDIS) 80 MG tablet Take 80 mg by mouth daily.      testosterone  cypionate (DEPOTESTOTERONE CYPIONATE) 200 MG/ML injection Inject 200 mg into the muscle every 14 (fourteen) days.      VITAMIN D PO Take 1 capsule by mouth daily.     No current facility-administered medications for this visit.     Musculoskeletal: Strength & Muscle Tone: na Gait & Station: na Patient leans: N/A  Psychiatric Specialty Exam: Review of Systems  All other systems reviewed and are negative.   There were no vitals taken for this visit.There is no height or weight on file to calculate BMI.  General Appearance: NA  Eye Contact:  NA  Speech:  Clear and Coherent  Volume:  Normal  Mood:  Euthymic  Affect:  NA  Thought Process:  Goal Directed  Orientation:  Full (Time, Place, and Person)  Thought Content: WDL   Suicidal Thoughts:  No  Homicidal Thoughts:  No  Memory:  Immediate;   Good Recent;   Good Remote;   Good  Judgement:  Good  Insight:  Fair  Psychomotor Activity:  Normal  Concentration:  Concentration: Good and Attention Span: Good  Recall:  Good  Fund of Knowledge: Good  Language: Good  Akathisia:  No  Handed:  Right  AIMS (if indicated): not done  Assets:  Communication Skills Desire for Improvement Resilience Social Support  ADL's:  Intact  Cognition: WNL  Sleep:  Good   Screenings: PHQ2-9    Flowsheet Row Video Visit from 05/06/2022 in [facility] [place] Video Visit from 02/04/2022 in [facility] at [place] Video Visit from 08/14/2021 in [facility] at [place] Video Visit from 05/20/2021 in [facility] Total Score 0 0 0 0   Flowsheet Row Admission (Discharged) from 12/14/2023 in [place] [facility] Admission (Discharged) from 07/24/2023 in [facility] UC from 03/15/2023 in [facility] RISK CATEGORY No Risk No  Risk No Risk     Assessment and Plan: This patient is a 68 year old male with a history of bipolar disorder and ADHD.  He is doing well on his current regimen.  He will continue Vyvanse  70 mg every morning for ADHD and Lamictal  200 mg daily for mood stabilization.  He will return to see me in 3 months  Collaboration of Care: Collaboration of Care: Primary Care Provider AEB notes are shared with PCP on the epic system  Patient/Guardian was advised Release of Information must be obtained prior to any record release in order to collaborate their care with an outside provider. Patient/Guardian was advised if they have not already done so to contact the registration department to sign all necessary forms in order for us  to release information regarding their care.   Consent: Patient/Guardian gives verbal consent for treatment and assignment of benefits for services provided during this visit. Patient/Guardian expressed understanding and agreed to proceed.    Barnie Gull, MD 07/07/2024, 10:06 AM

## 2024-09-12 ENCOUNTER — Other Ambulatory Visit (HOSPITAL_COMMUNITY): Payer: Self-pay | Admitting: Psychiatry

## 2024-09-12 ENCOUNTER — Telehealth (HOSPITAL_COMMUNITY): Payer: Self-pay

## 2024-09-12 NOTE — Telephone Encounter (Signed)
 This happened last year and we tried something else and he didn't like it. He could try calling other pharmacies like [facility] or [facility] but if no luck we can try something else

## 2024-09-12 NOTE — Telephone Encounter (Signed)
 Pt called in stating lisdexamfetamine  (VYVANSE ) 70 MG capsule is now $180 something where it was free the last couple of months and needed a pa. I called [facility] and spoke with tech, the cost is this much due to him not meeting deductible for this year. Pt is wanting to know what he can do. Please advise.

## 2024-09-13 NOTE — Telephone Encounter (Signed)
 Spoke with pt advised of dr nada message he is going to let us  know what he decides

## 2024-10-06 ENCOUNTER — Telehealth (HOSPITAL_COMMUNITY): Admitting: Psychiatry

## 2024-10-06 ENCOUNTER — Encounter (HOSPITAL_COMMUNITY): Payer: Self-pay | Admitting: Psychiatry

## 2024-10-06 DIAGNOSIS — F900 Attention-deficit hyperactivity disorder, predominantly inattentive type: Secondary | ICD-10-CM

## 2024-10-06 DIAGNOSIS — F3162 Bipolar disorder, current episode mixed, moderate: Secondary | ICD-10-CM

## 2024-10-06 MED ORDER — LISDEXAMFETAMINE DIMESYLATE 70 MG PO CAPS: 70.0000 mg | ORAL_CAPSULE | Freq: Every day | ORAL | 0 refills | Status: AC

## 2024-10-06 MED ORDER — LAMOTRIGINE 200 MG PO TABS: 200.0000 mg | ORAL_TABLET | Freq: Every day | ORAL | 2 refills | Status: AC

## 2024-10-06 NOTE — Progress Notes (Signed)
 Virtual Visit via Telephone Note  I connected with Philip Gates on 10/06/24 at  9:40 AM EST by telephone and verified that I am speaking with the correct person using two identifiers.  Location: Patient: home Provider: office   I discussed the limitations, risks, security and privacy concerns of performing an evaluation and management service by telephone and the availability of in person appointments. I also discussed with the patient that there may be a patient responsible charge related to this service. The patient expressed understanding and agreed to proceed.      I discussed the assessment and treatment plan with the patient. The patient was provided an opportunity to ask questions and all were answered. The patient agreed with the plan and demonstrated an understanding of the instructions.   The patient was advised to call back or seek an in-person evaluation if the symptoms worsen or if the condition fails to improve as anticipated.  I provided 20 minutes of non-face-to-face time during this encounter.   Barnie Gull, MD  [facility] MD/PA/NP OP Progress Note  10/06/2024 9:51 AM Philip Gates  MRN:  [id number]  Chief Complaint:  Chief Complaint  Patient presents with   ADHD   Manic Behavior   Follow-up   HPI: This patient is a 69 year old married white male lives with his wife in [place].  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   The patient returns for follow-up after 3 months regarding his ADHD and bipolar disorder.  He states that he continues to do well.  He denies significant depression or manic symptoms such as racing thoughts impulsivity anger or difficulty sleeping.  He is focusing well with the Vyvanse .  He had recent carpal tunnel surgery on his left hand so cannot  play golf for about 3 months Visit Diagnosis:    ICD-10-CM   1. Attention deficit hyperactivity disorder (ADHD), predominantly inattentive type   F90.0     2. 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   Venous insufficiency     Past Surgical History:  Procedure Laterality Date   BACK SURGERY  12/07/2020   cerebral stent     COLONOSCOPY N/A 01/02/2017   Procedure: COLONOSCOPY;  Surgeon: Golda Claudis PENNER, MD;  Location: [facility];  Service: Endoscopy;  Laterality: N/A;   dental implant  09/26/2014   upper and lower dental implant/bridges- permanent   ESOPHAGOGASTRODUODENOSCOPY N/A 01/02/2017   Procedure:  ESOPHAGOGASTRODUODENOSCOPY (EGD);  Surgeon: Golda Claudis PENNER, MD;  Location: [facility];  Service: Endoscopy;  Laterality: N/A;  910   EYE SURGERY Left 09/24/2015   Cataract   IR ANGIO INTRA EXTRACRAN SEL INTERNAL CAROTID BILAT MOD SED  01/20/2017   IR ANGIO VERTEBRAL SEL VERTEBRAL UNI L MOD SED  01/20/2017   JOINT REPLACEMENT Right    Knee   JOINT REPLACEMENT Left    Hip   NOSE SURGERY     RADIOLOGY WITH ANESTHESIA N/A 12/29/2014   Procedure: Embolization;  Surgeon: Gerldine Maizes, MD;  Location[facility] OR;  Service: Radiology;  Laterality: N/A;   SUBCLAVIAN ANGIOGRAM  07/17/2015   TOTAL HIP ARTHROPLASTY Left 01/03/2013   Procedure:  TOTAL HIP ARTHROPLASTY ANTERIOR APPROACH;  Surgeon: Dempsey LULLA Moan, MD;  Location: [facility] ORS;  Service: Orthopedics;  Laterality: Left;   TOTAL HIP ARTHROPLASTY Right 12/14/2023   Procedure: ARTHROPLASTY, HIP, TOTAL, ANTERIOR APPROACH;  Surgeon: Moan Dempsey, MD;  Location: [facility];  Service: Orthopedics;  Laterality: Right;   TOTAL KNEE ARTHROPLASTY Right 10/06/2014   Procedure: RIGHT TOTAL KNEE ARTHROPLASTY;  Surgeon: Dempsey Moan LULLA, MD;  Location: [facility];  Service: Orthopedics;  Laterality: Right;   TRANSFORAMINAL LUMBAR INTERBODY FUSION (TLIF) WITH PEDICLE SCREW FIXATION 1 LEVEL Right 07/24/2023   Procedure: Transforaminal Lumbar Interbody Fusion Lumbar Five-Sacral One;  Surgeon: Maizes Gerldine, MD;  Location: [facility] OR;  Service: Neurosurgery;  Laterality: Right;  3C   WISDOM TOOTH EXTRACTION      Family Psychiatric History: See below  Family History:  Family History  Problem Relation Age of Onset   Arrhythmia Mother        Has pacemaker   Hypertension Mother    Heart disease Mother    Mental illness Mother    Varicose Veins Mother    Depression Mother    Colon cancer Father    Cancer Father        Prostate and Colon   Diabetes Father    Hypertension Father    Cancer - Colon Father    Depression Sister     Social History:  Social History   Socioeconomic History   Marital status: Married    Spouse name: Not on file   Number of children: Not on file   Years of education: Not on file   Highest education level: Not on file  Occupational History   Not on file  Tobacco Use   Smoking status: Former    Current packs/day: 0.00    Average packs/day: 0.5 packs/day for 2.4 years (1.2 ttl pk-yrs)    Types: Cigarettes    Start date: 05/23/2015    Quit date: 10/09/2017    Years since quitting: 6.9   Smokeless tobacco: Former    Types: Snuff   Tobacco comments:    Stated I smoke off and on  Vaping Use   Vaping status: Never Used  Substance and Sexual Activity   Alcohol use:  Not Currently    Comment: last had fireball last night 09/10/21   Drug use: No   Sexual activity: Yes    Partners: Female  Other Topics Concern   Not on file  Social History Narrative   Not on file   Social Drivers of Health   Tobacco Use: Medium Risk (10/06/2024)   Patient History    Smoking Tobacco Use: Former    Smokeless Tobacco Use: Former    Passive Exposure: Not on Actuary Strain: Medium Risk (03/22/2024)  Received from [facility]   Overall Financial Resource Strain (CARDIA)    How hard is it for you to pay for the very basics like food, housing, medical care, and heating?: Somewhat hard  Food Insecurity: Low Risk (08/02/2024)   Received from [facility]   Epic    Within the past 12 months, you worried that your food would run out before you got money to buy more: Never true    Within the past 12 months, the food you bought just didn't last and you didn't have money to get more. : Never true  Transportation Needs: No Transportation Needs (08/02/2024)   Received from [facility]    In the past 12 months, has lack of reliable transportation kept you from medical appointments, meetings, work or from getting things needed for daily living? : No  Physical Activity: Inactive (03/22/2024)   Received from [facility]   Exercise Vital Sign    On average, how many days per week do you engage in moderate to strenuous exercise (like a brisk walk)?: 0 days    Minutes of Exercise per Session: Not on file  Stress: No Stress Concern Present (03/22/2024)   Received from [facility] - Occupational Stress Questionnaire    Do you feel stress - tense, restless, nervous, or anxious, or unable to sleep at night because your mind is troubled all the time - these days?: Not at all  Social Connections: Socially Integrated (03/22/2024)   Received from [facility]   Social Network    How would you rate your  social network (family, work, friends)?: Good participation with social networks  Depression (PHQ2-9): Low Risk (05/06/2022)   Depression (PHQ2-9)    PHQ-2 Score: 0  Alcohol Screen: Not on file  Housing: Low Risk (08/02/2024)   Received from [facility]   Epic    What is your living situation today?: I have a steady place to live    Think about the place you live. Do you have problems with any of the following? Choose all that apply:: None/None on this list  Utilities: Low Risk (08/02/2024)   Received from [facility]   Utilities    In the past 12 months has the electric, gas, oil, or water  company threatened to shut off services in your home? : No  Health Literacy: Not on file    Allergies: Allergies[1]  Metabolic Disorder Labs: No results found for: HGBA1C, MPG No results found for: PROLACTIN No results found for: CHOL, TRIG, HDL, CHOLHDL, VLDL, LDLCALC No results found for: TSH  Therapeutic Level Labs: No results found for: LITHIUM No results found for: VALPROATE No results found for: CBMZ  Current Medications: Current Outpatient Medications  Medication Sig Dispense Refill   Ascorbic Acid (VITAMIN C) 1000 MG tablet Take 1,000 mg by mouth 2 (two) times daily. With Zinc (20 mg)     calcium  carbonate (TUMS EX) 750 MG chewable tablet Chew 1 tablet by mouth 2 (two) times daily as needed for indigestion or heartburn.     chlorpheniramine (CHLOR-TRIMETON) 4 MG tablet Take 4 mg by mouth 2 (two) times daily as needed for allergies.     clindamycin-benzoyl peroxide (BENZACLIN) gel Apply 1 Application topically at bedtime.     cyanocobalamin (VITAMIN B12) 1000 MCG tablet Take 1,000 mcg by mouth daily.     ELDERBERRY/VITAMIN C/ZINC PO Take 1-2 tablets by mouth daily.     famotidine  (PEPCID ) 20  MG tablet Take 20 mg by mouth in the morning and at bedtime.     HYDROmorphone  (DILAUDID ) 2 MG tablet Take 1-2 tablets (2-4 mg total) by mouth every 6 (six) hours as  needed for severe pain (pain score 7-10) (breakthrough pain not responding to chronic hydrocodone ). 42 tablet 0   ipratropium (ATROVENT ) 0.03 % nasal spray Place 1 spray into both nostrils 2 (two) times daily as needed for rhinitis.     lamoTRIgine  (LAMICTAL ) 200 MG tablet Take 1 tablet (200 mg total) by mouth at bedtime. 30 tablet 2   lansoprazole (PREVACID) 30 MG capsule Take 30 mg by mouth 2 (two) times daily.     lisdexamfetamine  (VYVANSE ) 70 MG capsule Take 1 capsule (70 mg total) by mouth daily. 30 capsule 0   lisdexamfetamine  (VYVANSE ) 70 MG capsule Take 1 capsule (70 mg total) by mouth daily. 30 capsule 0   lisdexamfetamine  (VYVANSE ) 70 MG capsule Take 1 capsule (70 mg total) by mouth daily. 30 capsule 0   magic mouthwash (nystatin, hydrocortisone, diphenhydrAMINE , lidocaine ) suspension Swish and swallow 5 mLs daily as needed for mouth pain (throat pain).     methocarbamol  (ROBAXIN ) 500 MG tablet Take 1 tablet (500 mg total) by mouth every 6 (six) hours as needed for muscle spasms. 40 tablet 0   metoprolol  succinate (TOPROL -XL) 100 MG 24 hr tablet Take 100 mg by mouth at bedtime.     montelukast  (SINGULAIR ) 10 MG tablet Take 10 mg by mouth at bedtime.     Multiple Vitamins-Minerals (MULTIVITAMIN WITH MINERALS) tablet Take 1 tablet by mouth daily. Multivitamin for Women (needs the iron )     nystatin ointment (MYCOSTATIN) Apply 1 Application topically daily as needed (irritation). Apply to outside corner of lips     olopatadine  (PATADAY ) 0.1 % ophthalmic solution Place 1 drop into both eyes 2 (two) times daily as needed for allergies.     ondansetron  (ZOFRAN ) 4 MG tablet Take 1 tablet (4 mg total) by mouth every 6 (six) hours as needed for nausea. 20 tablet 0   OVER THE COUNTER MEDICATION Take 1 tablet by mouth daily. Cramp defence supplement     Povidone, PF, (IVIZIA DRY EYES) 0.5 % SOLN Place 1 drop into both eyes as needed (DRY EYES).     PROAIR  HFA 108 (90 Base) MCG/ACT inhaler Inhale 1-2  puffs into the lungs every 6 (six) hours as needed for shortness of breath or wheezing.     Probiotic Product (PROBIOTIC GUMMIES PO) Take 1 each by mouth daily.     promethazine  (PHENERGAN ) 25 MG tablet Take 25 mg by mouth every 6 (six) hours as needed for nausea or vomiting.     tadalafil (CIALIS) 20 MG tablet Take 20 mg by mouth at bedtime.     telmisartan (MICARDIS) 80 MG tablet Take 80 mg by mouth daily.     testosterone  cypionate (DEPOTESTOTERONE CYPIONATE) 200 MG/ML injection Inject 200 mg into the muscle every 14 (fourteen) days.      VITAMIN D PO Take 1 capsule by mouth daily.     No current facility-administered medications for this visit.     Musculoskeletal: Strength & Muscle Tone: na Gait & Station: na Patient leans: N/A  Psychiatric Specialty Exam: Review of Systems  Musculoskeletal:  Positive for arthralgias and joint swelling.  All other systems reviewed and are negative.   There were no vitals taken for this visit.There is no height or weight on file to calculate BMI.  General Appearance: NA  Eye Contact:  NA  Speech:  Clear and Coherent  Volume:  Normal  Mood:  Euthymic  Affect:  na  Thought Process:  Goal Directed  Orientation:  Full (Time, Place, and Person)  Thought Content: WDL   Suicidal Thoughts:  No  Homicidal Thoughts:  No  Memory:  Immediate;   Good Recent;   Good Remote;   Good  Judgement:  Good  Insight:  Fair  Psychomotor Activity:  Normal  Concentration:  Concentration: Good and Attention Span: Good  Recall:  Good  Fund of Knowledge: Good  Language: Good  Akathisia:  No  Handed:  Right  AIMS (if indicated): not done  Assets:  Communication Skills Desire for Improvement Physical Health Resilience Social Support Talents/Skills  ADL's:  Intact  Cognition: WNL  Sleep:  Good   Screenings: PHQ2-9    Flowsheet Row Video Visit from 05/06/2022 in [facility] [place] Video Visit from 02/04/2022 in [facility] [place] Video Visit from 08/14/2021 in [facility] [place] Video Visit from 05[facility]20/2021 in [facility] Total Score 0 0 0 0   Flowsheet Row Admission (Discharged) from 12/14/2023 in [facility] Admission (Discharged) from 07/24/2023 in [facility] UC from 03/15/2023 in [facility] RISK CATEGORY No Risk No Risk No Risk     Assessment and Plan: This patient is a 69 year old male with a history of bipolar disorder and ADHD.  He continues to do well on his current regimen.  He will continue Vyvanse  70 mg every morning for ADHD and Lamictal  200 mg daily for mood stabilization.  He will return to see me in 3 months  Collaboration of Care: Collaboration of Care: Primary Care Provider AEB notes are shared with PCP on the epic system  Patient/Guardian was advised Release of Information must be obtained prior to any record release in order to collaborate their care with an outside provider. Patient/Guardian was advised if they have not already done so to contact the registration department to sign all necessary forms in order for us  to release information regarding their care.   Consent: Patient/Guardian gives verbal consent for treatment and assignment of benefits for services provided during this visit. Patient/Guardian expressed understanding and agreed to proceed.    Barnie Gull, MD 10/06/2024, 9:51 AM     [1]  Allergies Allergen Reactions   Naltrexone Anaphylaxis   Clindamycin Other (See Comments)    Abdominal pain twisted stomach   Erythromycin     Caused a twisted stomach   Penicillins Rash

## 2024-10-07 ENCOUNTER — Telehealth (HOSPITAL_COMMUNITY): Admitting: Psychiatry
